# Patient Record
Sex: Male | Born: 1935 | Race: White | Hispanic: No | Marital: Married | Smoking: Former smoker
Health system: Southern US, Community
[De-identification: ages and names within clinical notes are randomized; demographics above are authoritative.]

## PROBLEM LIST (undated history)

## (undated) DIAGNOSIS — E785 Hyperlipidemia, unspecified: Secondary | ICD-10-CM

## (undated) DIAGNOSIS — J329 Chronic sinusitis, unspecified: Secondary | ICD-10-CM

## (undated) DIAGNOSIS — J45909 Unspecified asthma, uncomplicated: Secondary | ICD-10-CM

## (undated) DIAGNOSIS — C801 Malignant (primary) neoplasm, unspecified: Secondary | ICD-10-CM

## (undated) DIAGNOSIS — S42401A Unspecified fracture of lower end of right humerus, initial encounter for closed fracture: Secondary | ICD-10-CM

## (undated) DIAGNOSIS — Z8546 Personal history of malignant neoplasm of prostate: Secondary | ICD-10-CM

## (undated) HISTORY — DX: Chronic sinusitis, unspecified: J32.9

## (undated) HISTORY — PX: APPENDECTOMY: SHX54

## (undated) HISTORY — DX: Unspecified asthma, uncomplicated: J45.909

## (undated) HISTORY — DX: Hyperlipidemia, unspecified: E78.5

## (undated) HISTORY — DX: Personal history of malignant neoplasm of prostate: Z85.46

---

## 1999-07-01 ENCOUNTER — Ambulatory Visit (HOSPITAL_BASED_OUTPATIENT_CLINIC_OR_DEPARTMENT_OTHER): Admission: RE | Admit: 1999-07-01 | Discharge: 1999-07-01 | Payer: Self-pay | Admitting: Orthopedic Surgery

## 1999-09-13 ENCOUNTER — Encounter: Admission: RE | Admit: 1999-09-13 | Discharge: 1999-09-13 | Payer: Self-pay | Admitting: Family Medicine

## 1999-09-13 ENCOUNTER — Encounter: Payer: Self-pay | Admitting: Family Medicine

## 2001-11-30 ENCOUNTER — Encounter: Admission: RE | Admit: 2001-11-30 | Discharge: 2001-11-30 | Payer: Self-pay | Admitting: Family Medicine

## 2001-11-30 ENCOUNTER — Encounter: Payer: Self-pay | Admitting: Family Medicine

## 2003-05-19 ENCOUNTER — Ambulatory Visit: Admission: RE | Admit: 2003-05-19 | Discharge: 2003-08-17 | Payer: Self-pay | Admitting: Radiation Oncology

## 2003-05-22 ENCOUNTER — Encounter: Admission: RE | Admit: 2003-05-22 | Discharge: 2003-05-22 | Payer: Self-pay | Admitting: Urology

## 2003-06-20 ENCOUNTER — Ambulatory Visit (HOSPITAL_COMMUNITY): Admission: RE | Admit: 2003-06-20 | Discharge: 2003-06-20 | Payer: Self-pay | Admitting: Urology

## 2003-06-20 ENCOUNTER — Ambulatory Visit (HOSPITAL_BASED_OUTPATIENT_CLINIC_OR_DEPARTMENT_OTHER): Admission: RE | Admit: 2003-06-20 | Discharge: 2003-06-20 | Payer: Self-pay | Admitting: Urology

## 2004-06-22 ENCOUNTER — Ambulatory Visit: Payer: Self-pay | Admitting: Internal Medicine

## 2004-08-16 ENCOUNTER — Ambulatory Visit: Payer: Self-pay | Admitting: Internal Medicine

## 2004-11-10 ENCOUNTER — Ambulatory Visit: Payer: Self-pay | Admitting: Internal Medicine

## 2005-01-18 ENCOUNTER — Ambulatory Visit: Payer: Self-pay | Admitting: Internal Medicine

## 2005-02-24 ENCOUNTER — Ambulatory Visit: Payer: Self-pay | Admitting: Internal Medicine

## 2005-05-31 ENCOUNTER — Ambulatory Visit: Payer: Self-pay | Admitting: Internal Medicine

## 2005-07-14 ENCOUNTER — Ambulatory Visit: Payer: Self-pay | Admitting: Internal Medicine

## 2005-07-19 ENCOUNTER — Ambulatory Visit: Payer: Self-pay | Admitting: Pulmonary Disease

## 2005-08-05 ENCOUNTER — Ambulatory Visit: Payer: Self-pay | Admitting: Internal Medicine

## 2005-10-10 ENCOUNTER — Ambulatory Visit: Payer: Self-pay | Admitting: Internal Medicine

## 2006-01-16 ENCOUNTER — Ambulatory Visit: Payer: Self-pay | Admitting: Pulmonary Disease

## 2006-03-06 ENCOUNTER — Ambulatory Visit: Payer: Self-pay | Admitting: Internal Medicine

## 2006-03-16 ENCOUNTER — Ambulatory Visit: Payer: Self-pay | Admitting: Internal Medicine

## 2006-06-12 ENCOUNTER — Ambulatory Visit: Payer: Self-pay | Admitting: Internal Medicine

## 2006-07-04 ENCOUNTER — Ambulatory Visit: Payer: Self-pay | Admitting: Internal Medicine

## 2006-09-26 ENCOUNTER — Ambulatory Visit: Payer: Self-pay | Admitting: Internal Medicine

## 2006-10-11 ENCOUNTER — Ambulatory Visit: Payer: Self-pay | Admitting: Internal Medicine

## 2006-10-12 ENCOUNTER — Ambulatory Visit: Payer: Self-pay | Admitting: Internal Medicine

## 2006-11-07 ENCOUNTER — Ambulatory Visit: Payer: Self-pay | Admitting: Internal Medicine

## 2006-12-06 ENCOUNTER — Ambulatory Visit: Payer: Self-pay | Admitting: Internal Medicine

## 2007-01-15 ENCOUNTER — Ambulatory Visit: Payer: Self-pay | Admitting: Internal Medicine

## 2007-05-17 DIAGNOSIS — J4520 Mild intermittent asthma, uncomplicated: Secondary | ICD-10-CM

## 2007-05-17 DIAGNOSIS — J3089 Other allergic rhinitis: Secondary | ICD-10-CM

## 2007-05-17 DIAGNOSIS — J302 Other seasonal allergic rhinitis: Secondary | ICD-10-CM

## 2007-05-17 HISTORY — DX: Other seasonal allergic rhinitis: J30.2

## 2007-05-17 HISTORY — DX: Other allergic rhinitis: J30.89

## 2007-05-21 ENCOUNTER — Encounter: Payer: Self-pay | Admitting: Internal Medicine

## 2007-06-28 ENCOUNTER — Ambulatory Visit: Payer: Self-pay | Admitting: Internal Medicine

## 2007-06-28 DIAGNOSIS — J019 Acute sinusitis, unspecified: Secondary | ICD-10-CM

## 2007-06-28 HISTORY — DX: Acute sinusitis, unspecified: J01.90

## 2007-09-06 ENCOUNTER — Ambulatory Visit: Payer: Self-pay | Admitting: Internal Medicine

## 2007-11-05 ENCOUNTER — Ambulatory Visit: Payer: Self-pay | Admitting: Internal Medicine

## 2008-02-04 ENCOUNTER — Telehealth: Payer: Self-pay | Admitting: Internal Medicine

## 2008-02-06 ENCOUNTER — Ambulatory Visit: Payer: Self-pay | Admitting: Internal Medicine

## 2008-03-27 ENCOUNTER — Ambulatory Visit: Payer: Self-pay | Admitting: Internal Medicine

## 2008-03-31 ENCOUNTER — Telehealth: Payer: Self-pay | Admitting: Internal Medicine

## 2008-04-15 ENCOUNTER — Ambulatory Visit: Payer: Self-pay | Admitting: Internal Medicine

## 2008-06-18 ENCOUNTER — Telehealth (INDEPENDENT_AMBULATORY_CARE_PROVIDER_SITE_OTHER): Payer: Self-pay | Admitting: *Deleted

## 2008-07-24 ENCOUNTER — Ambulatory Visit: Payer: Self-pay | Admitting: Internal Medicine

## 2008-08-21 ENCOUNTER — Ambulatory Visit: Payer: Self-pay | Admitting: Internal Medicine

## 2008-08-21 DIAGNOSIS — C61 Malignant neoplasm of prostate: Secondary | ICD-10-CM | POA: Insufficient documentation

## 2008-08-21 DIAGNOSIS — E785 Hyperlipidemia, unspecified: Secondary | ICD-10-CM

## 2008-08-21 DIAGNOSIS — E782 Mixed hyperlipidemia: Secondary | ICD-10-CM | POA: Insufficient documentation

## 2008-08-21 HISTORY — DX: Mixed hyperlipidemia: E78.2

## 2008-11-13 ENCOUNTER — Ambulatory Visit: Payer: Self-pay | Admitting: Internal Medicine

## 2008-11-21 ENCOUNTER — Ambulatory Visit: Payer: Self-pay | Admitting: Internal Medicine

## 2009-01-16 ENCOUNTER — Telehealth (INDEPENDENT_AMBULATORY_CARE_PROVIDER_SITE_OTHER): Payer: Self-pay | Admitting: *Deleted

## 2009-01-21 ENCOUNTER — Encounter: Payer: Self-pay | Admitting: Internal Medicine

## 2009-02-23 ENCOUNTER — Ambulatory Visit: Payer: Self-pay | Admitting: Internal Medicine

## 2009-05-06 ENCOUNTER — Ambulatory Visit: Payer: Self-pay | Admitting: Internal Medicine

## 2009-06-09 ENCOUNTER — Ambulatory Visit: Payer: Self-pay | Admitting: Internal Medicine

## 2009-08-25 ENCOUNTER — Ambulatory Visit: Payer: Self-pay | Admitting: Internal Medicine

## 2009-08-26 ENCOUNTER — Ambulatory Visit: Payer: Self-pay | Admitting: Internal Medicine

## 2009-12-07 ENCOUNTER — Ambulatory Visit: Payer: Self-pay | Admitting: Internal Medicine

## 2010-01-27 ENCOUNTER — Ambulatory Visit: Payer: Self-pay | Admitting: Internal Medicine

## 2010-06-03 ENCOUNTER — Ambulatory Visit: Payer: Self-pay | Admitting: Internal Medicine

## 2010-06-08 NOTE — Assessment & Plan Note (Signed)
Summary: ALLERGIES/APC   Primary Provider/Referring Provider:  Christell Constant  CC:  ? Allergies; trouble with breathing and ? asthma attack..  History of Present Illness:  2008/09/06- Allergic rhintis, Asthma 3 weeks head congestion, worse lying down. Wearing a mask to mow. Nasal discharge is clear mucus with speck of blood. Ears pressured like fluid. Hearing OK. Denies cough, wheeze, fever. Continues allergy vaccine and claritin.  February 23, 2009-Allergic rhinitis, Asthma rov - nasal congestion -worse at hs - denies SOB or cough Nasal congestion better since cooler weather. Claritin helps, and he can tell when his allergy shot is due.  Steroid nasal sprays helped in past but he would only use up a sample. 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 Denies wheezing. Had flu and pneumovax last month.  June 09, 2009- Allergic rhinitis, Asthma Right ear pops and clears. Nasal congestion snd some drainage with throat clearing. Throat irritated but not sore and no fever. Not wheezing and nothing purulent.  August 25, 2009- Allergic rhinitis, Asthma 1 week with head congestion. Sister-in -law coughing, lives with him. She is apparently not acutely ill with something he might have caught. Mucus can be yellow. Chest gets tight but not wheezing. Right ear stops up.  Denies fever, sore throat, wheeze.    Current Medications (verified): 1)  Claritin 10 Mg  Tabs (Loratadine) .... Take 1 Tablet By Mouth Once A Day 2)  Simvastatin 40 Mg  Tabs (Simvastatin) .... Take 1 Tablet By Mouth Once A Day 3)  Levothroid 150 Mcg  Tabs (Levothyroxine Sodium) .... Take 1 Tablet By Mouth Once A Day 4)  Allergy Vaccine 1:10 Gets At Pcp  Allergies (verified): No Known Drug Allergies  Past History:  Past Medical History: Last updated: Sep 06, 2008 ASTHMA, MILD (ICD-493.90) ALLERGIC RHINITIS (ICD-477.9) hx rhinosinusitis Hx prostate cancer- seed implant  Hyperlipidemia  Past Surgical History: Last updated:  09-06-08 Appendectomy  Family History: Last updated: 2008/09/06 Father- died Alzheimers Mother- died diabetes complications  Social History: Last updated: 02/23/2009 Patient never smoked.  Furniture Musician- retired 2010  Risk Factors: Smoking Status: never (2008-09-06)  Review of Systems      See HPI  The patient denies anorexia, fever, weight loss, weight gain, vision loss, decreased hearing, hoarseness, chest pain, syncope, dyspnea on exertion, peripheral edema, prolonged cough, headaches, hemoptysis, and severe indigestion/heartburn.    Vital Signs:  Patient profile:   77 year old male Height:      72 inches Weight:      143.25 pounds BMI:     19.50 O2 Sat:      99 % on Room air Pulse rate:   71 / minute BP sitting:   132 / 74  (left arm) Cuff size:   regular  Vitals Entered By: Reynaldo Minium CMA (August 25, 2009 9:01 AM)  O2 Flow:  Room air  Physical Exam  Additional Exam:  General: A/Ox3; pleasant and cooperative, NAD, thin SKIN: no rash, lesions NODES: no lymphadenopathy HEENT: [place]/AT, EOM- WNL, Conjuctivae- clear, PERRLA, periorbital edema, TM-WNL., Nose- tight turbinate edema, septal deviation to  left,snorting.   Mallampati II, .Throat- clear and wnl, no post nasal drainage. NECK: Supple w/ fair ROM, JVD- none, normal carotid impulses w/o bruits  CHEST:  slow airflow, no rhonchi. Mild expiratory wheeze left scapula HEART: RRR, no m/g/r heard ABDOMEN: lean QMV:HQIO, nl pulses, no edema  NEURO: Grossly intact to observation      Impression & Recommendations:  Problem # 1:  ALLERGIC RHINITIS (ICD-477.9)  Acute rhinitis- viral or allergic given the time of year. It sounds as iff his family member is chronically ill, not sick with an acute process running through the family.  His updated medication list for this problem includes:    Claritin 10 Mg Tabs (Loratadine) .Marland Kitchen... Take 1 tablet by mouth once a day  Problem # 2:  ASTHMA, MILD  (ICD-493.90) Mild acute exacerbation should respond to depomedrol.  Medications Added to Medication List This Visit: 1)  Zithromax Z-pak 250 Mg Tabs (Azithromycin) .... 2 today then one daily  Other Orders: Est. Patient Level III (04540)  Patient Instructions: 1)  Keep scheduled appointment, or see me once yearly 2)  Depo 80 3)  neb neo nasal 4)  script to hold for antibioitic in case needed Prescriptions: ZITHROMAX Z-PAK 250 MG TABS (AZITHROMYCIN) 2 today then one daily  #1 pak x 0   Entered and Authorized by:   Waymon Budge MD   Signed by:   Waymon Budge MD on 08/25/2009   Method used:   Print then Give to Patient   RxID:   [id number]     Appended Document: ALLERGIES/APC    Clinical Lists Changes  Orders: Added new Service order of Depo- Medrol 80mg  (J1040) - Signed Added new Service order of Admin of Therapeutic Inj  intramuscular or subcutaneous (08657) - Signed Added new Service order of Nebulizer Tx (84696) - Signed       Medication Administration  Injection # 1:    Medication: Depo- Medrol 80mg     Diagnosis: ALLERGIC RHINITIS (ICD-477.9)    Route: SQ    Site: RUOQ gluteus    Exp Date: 03/2012    Lot #: [id number]    Mfr: Pharmacia    Comments: given by Nursing student Carver Fila.    Patient tolerated injection without complications    Given by: Reynaldo Minium CMA (August 25, 2009 4:30 PM)  Medication # 1:    Medication: EMR miscellaneous medications    Diagnosis: ALLERGIC RHINITIS (ICD-477.9)    Dose: 3 drops    Route: intranasal    Exp Date: 09/2010    Lot #: [id number]    Mfr: bayer    Comments: Neo-Synephrine    Patient tolerated medication without complications    Given by: Reynaldo Minium CMA (August 25, 2009 4:31 PM)  Orders Added: 1)  Depo- Medrol 80mg  [J1040] 2)  Admin of Therapeutic Inj  intramuscular or subcutaneous [96372] 3)  Nebulizer Tx [32440]

## 2010-06-08 NOTE — Assessment & Plan Note (Signed)
Summary: 6 months/apc   Primary Provider/Referring Provider:  Christell Constant  CC:  6 month follow up visit-allergies; "thickness and drainage in throat in morning".Right ear-possible loss of hearing-just bothersome.Marland Kitchen  History of Present Illness: 08-31-08- Allergic rhintis, Asthma 3 weeks head congestion, worse lying down. Wearing a mask to mow. Nasal discharge is clear mucus with speck of blood. Ears pressured like fluid. Hearing OK. Denies cough, wheeze, fever. Continues allergy vaccine and claritin.  February 23, 2009-Allergic rhinitis, Asthma rov - nasal congestion -worse at hs - denies SOB or cough Nasal congestion better since cooler weather. Claritin helps, and he can tell when his allergy shot is due.  Steroid nasal sprays helped in past but he would only use up a sample. 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 Denies wheezing. Had flu and pneumovax last month.  June 09, 2009- Allergic rhinitis, Asthma Right ear pops and clears. Nasal congestion snd some drainage with throat clearing. Throat irritated but not sore and no fever. Not wheezing and nothing purulent.  August 25, 2009- Allergic rhinitis, Asthma 1 week with head congestion. Sister-in -law coughing, lives with him. She is apparently not acutely ill with something he might have caught. Mucus can be yellow. Chest gets tight but not wheezing. Right ear stops up.  Denies fever, sore throat, wheeze.  December 07, 2009- Allergic rhinitis, Asthma Right ear stops up. Blowing nose- mostly dry, feels stuffy head. Continues to take allergy shots. Gardens early in the day, but has been outside quite alot in current poor air quality.. Chest is clear- no asthma attacks. Has not needed a rescue inhaler in over a year. He didn't feel he needed antibiotic so didn't take the Z pak last time. Feels "thickness or drainage" in throat but doesn't clear out anything and denies reflux/ heartburn.    Asthma History    Initial Asthma Severity  Rating:    Age range: 12+ years    Symptoms: 0-2 days/week    Nighttime Awakenings: 0-2/month    Interferes w/ normal activity: no limitations    SABA use (not for EIB): 0-2 days/week    Asthma Severity Assessment: Intermittent   Preventive Screening-Counseling & Management  Alcohol-Tobacco     Smoking Status: never  Current Medications (verified): 1)  Claritin 10 Mg  Tabs (Loratadine) .... Take 1 Tablet By Mouth Once A Day 2)  Simvastatin 40 Mg  Tabs (Simvastatin) .... Take 1 Tablet By Mouth Once A Day 3)  Levothroid 150 Mcg  Tabs (Levothyroxine Sodium) .... Take 1 Tablet By Mouth Once A Day 4)  Allergy Vaccine 1:10 Gets At Pcp  Allergies (verified): No Known Drug Allergies  Past History:  Past Medical History: Last updated: 08-31-08 ASTHMA, MILD (ICD-493.90) ALLERGIC RHINITIS (ICD-477.9) hx rhinosinusitis Hx prostate cancer- seed implant  Hyperlipidemia  Past Surgical History: Last updated: 08-31-08 Appendectomy  Family History: Last updated: 31-Aug-2008 Father- died Alzheimers Mother- died diabetes complications  Social History: Last updated: 02/23/2009 Patient never smoked.  Furniture Musician- retired 2010  Risk Factors: Smoking Status: never (12/07/2009)  Review of Systems      See HPI       The patient complains of nasal congestion/difficulty breathing through nose and sneezing.  The patient denies shortness of breath with activity, shortness of breath at rest, productive cough, non-productive cough, chest pain, irregular heartbeats, acid heartburn, indigestion, loss of appetite, weight change, abdominal pain, difficulty swallowing, sore throat, tooth/dental problems, and headaches.    Vital Signs:  Patient profile:  77 year old male Height:      72 inches Weight:      142 pounds BMI:     19.33 O2 Sat:      99 % on Room air Pulse rate:   76 / minute BP sitting:   124 / 70  (right arm) Cuff size:   regular  Vitals Entered By: Reynaldo Minium CMA (December 07, 2009 8:55 AM)  O2 Flow:  Room air CC: 6 month follow up visit-allergies; "thickness and drainage in throat in morning".Right ear-possible loss of hearing-just bothersome.   Physical Exam  Additional Exam:  General: A/Ox3; pleasant and cooperative, NAD, thin. Room air sat 99% SKIN: no rash, lesions NODES: no lymphadenopathy HEENT: [place]/AT, EOM- WNL, Conjuctivae- clear, PERRLA, periorbital edema, TM-right drum is retracted without erythema or fluid.., Nose- tight turbinate edema, septal deviation to  left,snorting.   Mallampati II, .Throat- clear and wnl, no post nasal drainage.  NECK: Supple w/ fair ROM, JVD- none, normal carotid impulses w/o bruits  CHEST:  slow airflow, no rhonchi. Mild expiratory wheeze left scapula HEART: RRR, no m/g/r heard ABDOMEN: lean GHW:EXHB, nl pulses, no edema  NEURO: Grossly intact to observation      Impression & Recommendations:  Problem # 1:  ALLERGIC RHINITIS (ICD-477.9)  Allergy or , more likely right now, vasomotor irritant rhinitis from the air quality problems. There is eustachian dysfunction related to the same process.  His updated medication list for this problem includes:    Claritin 10 Mg Tabs (Loratadine) .Marland Kitchen... Take 1 tablet by mouth once a day    Nasonex 50 Mcg/act Susp (Mometasone furoate) .Marland Kitchen... 2 sprays each nostril once daily  Problem # 2:  ASTHMA, MILD (ICD-493.90) Mild intermittent asthma, not needing additional intervention for now.  Medications Added to Medication List This Visit: 1)  Nasonex 50 Mcg/act Susp (Mometasone furoate) .... 2 sprays each nostril once daily  Other Orders: Est. Patient Level III (71696)  Patient Instructions: 1)  Please schedule a follow-up appointment in 6 months. 2)  Continue allergy vaccine 3)  Sample script nasal steroid inhaler Nasonex 4)    2 sprays each nostril once daily- most people do this at bedtime. 5)  a decongestant like otc Sudafed-PE may  help Prescriptions: NASONEX 50 MCG/ACT SUSP (MOMETASONE FUROATE) 2 sprays each nostril once daily  #1 x prn   Entered and Authorized by:   Waymon Budge MD   Signed by:   Waymon Budge MD on 12/07/2009   Method used:   Print then Give to Patient   RxID:   [id number]

## 2010-06-08 NOTE — Assessment & Plan Note (Signed)
Summary: 6 months/apc   Primary Provider/Referring Provider:  Christell Constant  CC:  6 month follow up visit-?URI; Right ear stopped up.Marland Kitchen  History of Present Illness: 04/15/08- Allergic rhinitis, asthma, recurrent rhinosinusitis Much  yard work yest. Since then increased tightness and cough, without fever, pain or purulent. Some shortness of breath, and tenderness in upper chest.  09/11/08- Allergic rhintis, Asthma 3 weeks head congestion, worse lying down. Wearing a mask to mow. Nasal discharge is clear mucus with speck of blood. Ears pressured like fluid. Hearing OK. Denies cough, wheeze, fever. Continues allergy vaccine and claritin.  February 23, 2009-Allergic rhinitis, Asthma rov - nasal congestion -worse at hs - denies SOB or cough Nasal congestion better since cooler weather. Claritin helps, and he can tell when his allergy shot is due.  Steroid nasal sprays helped in past but he would only use up a sample. 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 Denies wheezing. Had flu and pneumovax last month.  June 09, 2009- Allergic rhinitis, Asthma Right ear pops and clears. Nasal congestion snd some drainage with throat clearing. Throat irritated but not sore and no fever. Not wheezing and nothing purulent.    Current Medications (verified): 1)  Claritin 10 Mg  Tabs (Loratadine) .... Take 1 Tablet By Mouth Once A Day 2)  Simvastatin 40 Mg  Tabs (Simvastatin) .... Take 1 Tablet By Mouth Once A Day 3)  Levothroid 150 Mcg  Tabs (Levothyroxine Sodium) .... Take 1 Tablet By Mouth Once A Day 4)  Allergy Vaccine 1:10 Gets At Pcp  Allergies (verified): No Known Drug Allergies  Past History:  Past Medical History: Last updated: 2008-09-11 ASTHMA, MILD (ICD-493.90) ALLERGIC RHINITIS (ICD-477.9) hx rhinosinusitis Hx prostate cancer- seed implant  Hyperlipidemia  Past Surgical History: Last updated: 09-11-08 Appendectomy  Family History: Last updated: Sep 11, 2008 Father- died  Alzheimers Mother- died diabetes complications  Social History: Last updated: 02/23/2009 Patient never smoked.  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    Vital Signs:  Patient profile:   77 year old male Height:      72 inches Weight:      151.31 pounds BMI:     20.60 O2 Sat:      97 % on Room air Pulse rate:   76 / minute BP sitting:   128 / 80  (right arm) Cuff size:   regular  Vitals Entered By: Reynaldo Minium CMA (June 09, 2009 9:22 AM)  O2 Flow:  Room air  Physical Exam  Additional Exam:  General: A/Ox3; pleasant and cooperative, NAD, thin SKIN: no rash, lesions NODES: no lymphadenopathy HEENT: [place]/AT, EOM- WNL, Conjuctivae- clear, PERRLA, periorbital edema, TM-Right is retracted and question some fluid., Nose- tight turbinate edema, septal deviation to  left, Throat- clear and wnl, no post nasal drainage. NECK: Supple w/ fair ROM, JVD- none, normal carotid impulses w/o bruits  CHEST:  slow airflow, no rhonchi HEART: RRR, no m/g/r heard ABDOMEN: lean ZOX:WRUE, nl pulses, no edema  NEURO: Grossly intact to observation      Impression & Recommendations:  Problem # 1:  ALLERGIC RHINITIS (ICD-477.9)  He has had exposure to loud noise, but some of his intermittent right hearing loss is due to eustachian dysfunction.  we will see if we can help with a neb.We will try sample nasal steroid again. The following medications were removed from the medication list:    Fluticasone Propionate 50 Mcg/act Susp (Fluticasone propionate) .Marland Kitchen... 1-2 sprays each nostril daily when needed His updated medication list for this problem includes:    Claritin 10 Mg Tabs  (Loratadine) .Marland Kitchen... Take 1 tablet by mouth once a day  Problem # 2:  ASTHMA, MILD (ICD-493.90) Good control currently Had flu vax.  Other Orders: Est. Patient Level III (14782) Admin of Therapeutic Inj  intramuscular or subcutaneous (95621) Depo- Medrol 80mg  (J1040) Nebulizer Tx (30865)  Patient Instructions: 1)  Please schedule a follow-up appointment in 6 months. 2)  Neb neo nasal 3)  depo 80 4)  Sample Nasonex nasal steroid spray- 2 puffs each nostril once daily until you use up the sample.     Medication Administration  Injection # 1:    Medication: Depo- Medrol 80mg     Diagnosis: ALLERGIC RHINITIS (ICD-477.9)    Route: SQ    Site: RUOQ gluteus    Exp Date: 02/2010    Lot #: [id number]    Mfr: Teva    Patient tolerated injection without complications    Given by: Reynaldo Minium CMA (June 09, 2009 10:34 AM)  Medication # 1:    Medication: EMR miscellaneous medications    Diagnosis: ALLERGIC RHINITIS (ICD-477.9)    Dose: 3 drops    Route: intranasal    Exp Date: 09/2010    Lot #: [id number]    Mfr: Bayer    Comments: Neo-Synephrine    Patient tolerated medication without complications    Given by: Reynaldo Minium CMA (June 09, 2009 10:35 AM)  Orders Added: 1)  Est. Patient Level III [44010] 2)  Admin of Therapeutic Inj  intramuscular or subcutaneous [96372] 3)  Depo- Medrol 80mg  [J1040] 4)  Nebulizer Tx [27253]

## 2010-06-08 NOTE — Miscellaneous (Signed)
Summary: Injection Orders / [facility]    Injection Orders / [facility]    Imported By: Lennie Odor 10/06/2009 15:10:43  _____________________________________________________________________  External Attachment:    Type:   Image     Comment:   External Document

## 2010-06-09 ENCOUNTER — Ambulatory Visit: Payer: Self-pay | Admitting: Internal Medicine

## 2010-06-09 ENCOUNTER — Ambulatory Visit: Admit: 2010-06-09 | Payer: Self-pay | Admitting: Internal Medicine

## 2010-09-20 ENCOUNTER — Ambulatory Visit (INDEPENDENT_AMBULATORY_CARE_PROVIDER_SITE_OTHER): Payer: Self-pay

## 2010-09-20 DIAGNOSIS — J309 Allergic rhinitis, unspecified: Secondary | ICD-10-CM

## 2010-09-21 NOTE — Assessment & Plan Note (Signed)
[facility]                             PULMONARY OFFICE NOTE   NAME:PIKEKurtis, Anastasia                    MRN:          [id number]  DATE:12/06/2006                            DOB:          01-16-1932    PROBLEMS:  1. Allergic rhinitis.  2. Mild intermittent asthma.   HISTORY:  Allergy vaccine build up with new prescription is doing very  well. We reviewed safety and management aspects. He says that his  breathing is comfortable and he is having no problems. He shows me some  dog scratches on his arm, which look clean.   MEDICATIONS:  1. Claritin.  2. Allergy vaccine.  3. Mucinex.  4. Fosamax.  5. Simvastatin.  6. Levothyroxine.  7. Nasonex nasal spray.   ALLERGIES:  No medication allergy.   OBJECTIVE:  Weight 142 pounds, blood pressure 116/76, pulse 66, room air  saturation 99%. Breath sounds are diminished, but clear and unlabored.  Nasal airway is not obstructed. Heart sounds are normal.   IMPRESSION:  Asthma and allergic rhinitis currently doing well in mid-  summer.   PLAN:  Continue vaccine build up. Schedule return in 6 months, earlier  p.r.n.     Clinton D. Maple Hudson, MD, Tonny Bollman, FACP  Electronically Signed    CDY/MedQ  DD: 12/09/2006  DT: 12/10/2006  Job #: [id number]   cc:   Lianne Bushy, M.D.

## 2010-09-21 NOTE — Assessment & Plan Note (Signed)
[facility]                             PULMONARY OFFICE NOTE   NAME:[name], [name]                    MRN:          [id number]  DATE:09/26/2006                            DOB:          Mar 21, 1932    HISTORY OF PRESENT ILLNESS:  Patient is a 77 year old white male patient  of Dr. Roxy Cedar who has a known history of mild intermittent asthma,  allergic rhinitis, presents today with a 5 day history of nasal  congestion, productive cough with thick whitish yellow mucus. Patient  denies any chest pain, orthopnea, PND, or leg swelling.   PAST MEDICAL HISTORY:  Reviewed.   CURRENT MEDICATIONS:  Reviewed.   PHYSICAL EXAMINATION:  Patient is a pleasant male in no acute distress.  He is afebrile with stable vital signs. O2 saturation is 98% on room  air.  HEENT: Nasal mucosa slightly pale. Nontender sinuses. Posterior pharynx  clear.  NECK: Supple without cervical adenopathy, no JVD.  LUNG SOUNDS: Revealed diminished breath sounds at the bases otherwise  clear.  CARDIAC: Regular rate and rhythm.  ABDOMEN: Soft and nontender.  EXTREMITIES: Warm without any edema.   IMPRESSION AND PLAN:  Acute upper respiratory infection. Patient is to  begin Mucinex DM twice daily. Begin a nasal hygiene regimen with  Nasacort AQ, Afrin, and saline times 5 days. Patient will be given a  prescription for 7 day course of Omnicef to have on hold in case  symptoms worsen with discolored sputum. Patient is to return back with  Dr. Maple Hudson next week as scheduled or sooner if needed.      Rubye Oaks, NP  Electronically Signed      Clinton D. Maple Hudson, MD, Tonny Bollman, FACP  Electronically Signed   TP/MedQ  DD: 09/26/2006  DT: 09/26/2006  Job #: [id number]

## 2010-09-21 NOTE — Assessment & Plan Note (Signed)
[name]                             PULMONARY OFFICE NOTE   NAME:[name], [name]                    MRN:          [id number]  DATE:10/11/2006                            DOB:          1932-04-23    PROBLEM:  1. Allergic rhinitis.  2. Mild intermittent asthma.   HISTORY:  He needed to see the nurse practitioner again in late May with  an exacerbation of asthma and rhinitis, possible upper respiratory  infection. He comes now for skin testing. He has been vaccine at 1:10  based on testing in 2001 and we had discussed previously that he is  beginning to see less control. He used Mucinex D as needed, but still  feels he has a thick clear mucus in his throat from post-nasal drainage.  There has been nothing purulent. Little wheezing once his recent  exacerbation cleared.   MEDICATIONS:  1. Claritin.  2. Allergy vaccine at 1:10.  3. Mucinex.  4. Fosamax.  5. Nasonex.   ALLERGIES:  No medication allergy.   OBJECTIVE:  Weight 142 pounds, blood pressure 122/70.  Pulse is 85.  Room air saturation is 95%.  CHEST: Is clear now.  There is some clear mucus in the back of his throat. Nasal airway is not  obviously obstructed. There is no conjunctival injection or peri-orbital  edema. No adenopathy. Voice quality is normal with no cough or wheeze.  Heart sounds are normal.   SKIN TEST:  Positive histamine and negative diluent controls. Broader  positive reaction profile than he had in 2001, still significant  especially for grass, weed and tree pollens, dust and dust mite and also  some molds. We have reviewed environmental factors and minimizing  exposures to pollen, dust and mold. We have reviewed the risk and goal  considerations of allergy vaccine as a treatment strategy. He was given  Clarinex 5 mg after his skin testing.   PLAN:  We are going to remix and restart his vaccine based on current  testing with new build up to maintenance.  Injections continue to be  given at Dr.  Beatriz Stallion office and they are asked to call if there are  questions. Schedule return with me in two months, earlier p.r.n.     James D. Maple Hudson, MD, [name], FACP  Electronically Signed    CDY/MedQ  DD: 10/11/2006  DT: 10/11/2006  Job #: [id number]   cc:   [name], M.D.

## 2010-09-24 NOTE — Assessment & Plan Note (Signed)
[facility]                               PULMONARY OFFICE NOTE   NAME:[name], [name]                    MRN:          [id number]  DATE:01/16/2006                            DOB:          10/08/1931    HISTORY OF PRESENT ILLNESS:  The patient is a 77 year old white male patient  of Dr. Maple Hudson with a history of allergic rhinitis, who presents with a 3 day  history of nasal congestion, post nasal drip, and nasal stuffiness. The  patient denies any purulent sputum, fever, chest pain, shortness of breath.  The patient does complain of an intermittent dry cough.   PHYSICAL EXAMINATION:  GENERAL:  The patient is a pleasant white male in no  acute distress.  VITAL SIGNS:  He is afebrile with stable vital signs. O2 saturation is 98%  on room air.  HEENT:  Nasal mucosa with some moderate erythema. Non-tender sinus.  Posterior pharynx is clear.  NECK:  Supple without adenopathy. No JVD.  LUNGS:  Clear.  CARDIOVASCULAR:  Regular rate and rhythm.  ABDOMEN:  Soft.  EXTREMITIES:  Upper extremities warm without edema.   IMPRESSION:  Acute rhinitis.   PLAN:  The patient is to begin Nasacort AQ 2 puffs twice daily, Mucinex DM  twice daily, saline Afrin nasal spray x5 days. The patient is to return if  no better or symptoms worsen.                                   Rubye Oaks, NP                                Clinton D. Maple Hudson, MD, [facility], FACP   TP/MedQ  DD:  01/16/2006  DT:  01/17/2006  Job #:  [id number]

## 2010-09-24 NOTE — Assessment & Plan Note (Signed)
[facility]                             PULMONARY OFFICE NOTE   NAME:PIKEEwen, Varnell                    MRN:          [id number]  DATE:07/04/2006                            DOB:          09-29-1931    PROBLEM:  1. Allergic rhinitis.  2. Mild intermittent asthma.   HISTORY:  I had last seen him in March a year ago. He saw the nurse  practitioner here in February with some asthma and his albuterol was  refilled. At that time, he was using Nasacort, but he has been out of  nasal sprays and mentions Nasonex as perhaps his favorite. He blames the  maple trees, which are blooming now, for increased nasal congestion. He  has no headache, purulent discharge or fever. We reviewed his vaccine.  Last skin testing was about seven years ago and does not have adequate  tree pollen in its make up.   MEDICATIONS:  1. Claritin p.r.n.  2. Allergy vaccine at 1:10 which he gets at Dr. Harlene Salts with no      problem.  3. He is out of nasal spray, but has been using an over-the-counter      Mucinex nasal spray.   DRUG INTOLERANCES:  No medication allergy.   OBJECTIVE:  Weight is 148 pounds.  Blood pressure is 110/60.  Pulse 71.  Room air saturation is 98%.  There is significant bilateral nasal mucosal edema with clear watery  secretion. No visible polyps. No conjunctival injection.  CHEST: Is clear.  HEART: Sounds normal.   IMPRESSION:  Exacerbation of allergic rhinitis.   PLAN:  1. Neosynephrine inhalation, Depo-Medrol 80 mg IM with steroid talk.  2. Refilled Nasonex nasal spray twice each nostril daily.  3. He is going to return as able for allergy retesting.     Clinton D. Maple Hudson, MD, [facility], FACP  Electronically Signed    CDY/MedQ  DD: 07/04/2006  DT: 07/04/2006  Job #: [id number]   cc:   Lianne Bushy, M.D.

## 2010-09-24 NOTE — Assessment & Plan Note (Signed)
[facility]                               PULMONARY OFFICE NOTE   NAME:[name], [name]                    MRN:          [id number]  DATE:03/16/2006                            DOB:          1931-10-10    PULMONARY/ALLERGY FOLLOWUP   PROBLEM:  Seasonal allergic rhinitis.   HISTORY:  I had last seen him in March.  He was seen by the nurse  practitioner September 10 for acute rhinitis treated with Nasacort AQ and  Mucinex.  He has continued his vaccine at 1:10 at Dr. Harlene Salts office with no  problems and has had his flu shot.  He is semi-retired from the furniture  business.  He indicates his part of the manufacturing process does not  expose him much to dust or irritants.  In the past week he has had increased  nasal congestion, blamed on weather.  He had done better after his  September visit.  Sometimes, he notes a little shortness of breath and mild  wheeze.  He has not had any rescue inhaler in a long time and has remained  off of Advair because he has not seemed to need it after the sample we gave  in March.   MEDICATIONS:  Claritin.  Allergy vaccine.  Nasacort AQ.   No medication allergy.   OBJECTIVE:  Weight 146 pounds, BP 142/80, pulse regular 84, room air  saturation 98%.  Trace end expiratory wheeze, unlabored.  Regular heart sounds.  There is mild nasal congestion at most.  There is no post-nasal drip.  No neck vein distension.  He is quite talkative actually and seems comfortable.   IMPRESSION:  1. Allergic rhinitis.  2. Asthma with mild exacerbation.   PLAN:  We refilled albuterol.  He will use his Nasacort and he thinks he can  manage his current symptoms with available meds.  Schedule return in 12  months.  Continue vaccine at 1:10.     Clinton D. Maple Hudson, MD, [facility], FACP  Electronically Signed    CDY/MedQ  DD: 03/19/2006  DT: 03/19/2006  Job #: [id number]   cc:   Lianne Bushy, M.D.

## 2010-09-24 NOTE — Op Note (Signed)
NAMESAHMIR, WEATHERBEE                       ACCOUNT NO.:  [id number]   MEDICAL RECORD NO.:  [id number]                   PATIENT TYPE:  AMB   LOCATION:  [facility]                                 FACILITY:  [facility]   PHYSICIAN:  Susanne Borders, MD                     DATE OF BIRTH:  08-23-1931   DATE OF PROCEDURE:  DATE OF DISCHARGE:                                 OPERATIVE REPORT   PREOPERATIVE DIAGNOSIS:  Adenocarcinoma of the prostate.   POSTOPERATIVE DIAGNOSIS:  Adenocarcinoma of the prostate.   OPERATION/PROCEDURE:  1. Transperineal brachytherapy to prostate.  2. Cystoscopy.   SURGEON:  Lucrezia Starch. Earlene Plater, M.D.   ASSISTANT:  1. Wynn Banker, M.D.  2. Susanne Borders, M.D.   ANESTHESIA:  General endotracheal anesthesia.   ESTIMATED BLOOD LOSS:  Minimal.   COMPLICATIONS:  None.   DRAINS:  16-French Foley catheter to straight drainage.   DISPOSITION:  To post anesthesia care unit in stable condition.   INDICATIONS FOR PROCEDURE:  Mr. Cashman is a 77 year old male who was referred  by Dr. Kathrynn Humble to Dr. Earlene Plater for evaluation of elevated PSA and a strong  family history of prostate cancer.  The patient initially had a PSA of 5.8  which was up from 3.7 the year prior.  He subsequently underwent a  transrectal ultrasound guided biopsy of the prostate.  His pathology  returned a Gleason's score of 6 involving less than 5% of the specimen on  the right with high-grade prostatic intraepithelial neoplasia on the left.  The patient's treatment options were discussed with him in detail including  watchful waiting, radiation therapy, and radical prostatectomy.  The patient  chose to have radioactive seed implantation after discussing this with Dr.  Kathrin Greathouse.  He subsequently underwent transrectal ultrasound mapping  of his prostate.  This was used for dosimetry planning by Dr. Dan Humphreys.   DESCRIPTION OF PROCEDURE:  The patient was brought to the operating room and  correctly  identified by his identification bracelet.  He was given  preoperative antibiotics and general endotracheal anesthesia.  He was placed  in exaggerated dorsal lithotomy position and his genitalia and perineum were  prepped and draped in the typical sterile fashion.  A rectal Foley catheter  was placed in the rectum.  The ultrasound probe was carefully inserted to  the rectum and transrectal ultrasound images were obtained.  Preoperative  planning for seed implantation was then performed by Dr. Dan Humphreys.  A few  small adjustments were made in the original mapping schema.  The grid was  then inserted and implantation of the seeds was done systematically,  starting from anterior and moving posteriorly.  Confirmation of the seeds in  the correct locations was done using ultrasound as well as fluoroscopy to  insure the seeds were in the correct location in all three planes.  A total  of 26 needles containing 96  active seeds were inserted.  The radio isotope  used was palladium-103.  The seed activity was 1.360 mCu.  At the end of  implantation, the patient was taken out of lithotomy position.  The rectal  Foley catheter was removed.  The patient's transurethral Foley catheter was  also removed and flexible cystoscopy was performed.  There were no seeds  noted within the prostatic urethra.  The bladder was also free of seeds.  Retroflexion for careful inspection of the bladder neck revealed two  submucosal seeds which were in good position.  The cystoscope was then  removed and the Foley catheter was replaced and connected to a leg bag.   The patient will be discharged home on pain medication and his Foley  catheter.  He is to remove his catheter tomorrow morning.                                               Susanne Borders, MD    DR/MEDQ  D:  06/20/2003  T:  06/20/2003  Job:  [id number]

## 2010-10-20 ENCOUNTER — Ambulatory Visit (INDEPENDENT_AMBULATORY_CARE_PROVIDER_SITE_OTHER): Payer: Medicare Other | Admitting: Internal Medicine

## 2010-10-20 ENCOUNTER — Encounter: Payer: Self-pay | Admitting: Internal Medicine

## 2010-10-20 VITALS — BP 110/80 | HR 74 | Ht 72.0 in | Wt 137.8 lb

## 2010-10-20 DIAGNOSIS — J45909 Unspecified asthma, uncomplicated: Secondary | ICD-10-CM

## 2010-10-20 DIAGNOSIS — J309 Allergic rhinitis, unspecified: Secondary | ICD-10-CM

## 2010-10-20 MED ORDER — PHENYLEPHRINE HCL 1 % NA SOLN
3.0000 [drp] | Freq: Once | NASAL | Status: AC
  Administered 2010-10-20: 3 [drp] via NASAL

## 2010-10-20 MED ORDER — METHYLPREDNISOLONE ACETATE 80 MG/ML IJ SUSP
80.0000 mg | Freq: Once | INTRAMUSCULAR | Status: AC
  Administered 2010-10-20: 80 mg via INTRAMUSCULAR

## 2010-10-20 NOTE — Progress Notes (Signed)
  Subjective:    Patient ID: [name], male    DOB: 10/14/1931, 77 y.o.   MRN: [id number]  HPI 10/20/10- 79 yoM never smoker allergic rhinitis, mild asthma, hx sinusitis, complicated by hx prostate ca and hyperlipidemia Last here December 07, 2009- note reviewed Reports 1 month of increased nasal congestion, post nasal drip. Denies sore throat fever. Ears feel stopped up, hearing unchanged. Not using Nasonex, but favors saline nasal spray.  Feels congestion in upper chest, cough productive white mucus. Denies wheeze and has no inhaler.  Several tick bites in recent weeks. His PCP Dr Marina Goodell did test for tick borne illness and gave doxycycline- just finished- labs negative.  Continues allergy vaccine at 1:10 and claritin  Review of Systems Constitutional:   No weight loss, night sweats,  Fevers, chills, fatigue, lassitude. HEENT:   No headaches,  Difficulty swallowing,  Tooth/dental problems,  Sore throat,                CV:  No chest pain,  Orthopnea, PND, swelling in lower extremities, anasarca, dizziness, palpitations  GI  No heartburn, indigestion, abdominal pain, nausea, vomiting, diarrhea, change in bowel habits, loss of appetite  Resp: No shortness of breath with exertion or at rest.  No excess mucus,   No coughing up of blood.  No change in color of mucus.  No wheezing.    Skin: no rash or lesions.  GU: no dysuria, change in color of urine, no urgency or frequency.  No flank pain.  MS:  No joint pain or swelling.  No decreased range of motion.  No back pain.  Psych:  No change in mood or affect. No depression or anxiety.  No memory loss.      Objective:   Physical Exam General- Alert, Oriented, Affect-appropriate, Distress- none acute  slender  Skin- rash-none, lesions- none, excoriation- none  Lymphadenopathy- none  Head- atraumatic  Eyes- Gross vision intact, PERRLA, conjunctivae clear secretions  Ears- Hearing, canals, Tm -- hearing diminished  Nose- Clear, No-  Septal dev,polyps, erosion, perforation     Mucus bridging and turbinate edema  Throat- Mallampati II , mucosa clear , drainage- none, tonsils- atrophic   No visible drainage  Neck- flexible , trachea midline, no stridor , thyroid nl, carotid no bruit  Chest - symmetrical excursion , unlabored     Heart/CV- RRR , no murmur , no gallop  , no rub, nl s1 s2                     - JVD- none , edema- none, stasis changes- none, varices- none     Lung- clear to P&A, wheeze- none, cough- none , dullness-none, rub- none     Chest wall-  Abd- tender-no, distended-no, bowel sounds-present, HSM- no  Br/ Gen/ Rectal- Not done, not indicated  Extrem- cyanosis- none, clubbing, none, atrophy- none, strength- nl  Neuro- grossly intact to observation         Assessment & Plan:

## 2010-10-20 NOTE — Assessment & Plan Note (Signed)
Minimal- we will see how he responds to the depo today before considering a rescue inhaler.

## 2010-10-20 NOTE — Patient Instructions (Signed)
Neb nasal neo  Depo 80-    Dx allergic rhinitis  Instead of Claritin, consider trying otc generic Allegra =  Fexofenadine as an antihistamine

## 2010-10-20 NOTE — Assessment & Plan Note (Signed)
We will continue allergy vaccine, but suggested he try allegra instead of claritin. Consider restarting nasonex. Can give neb and depo today

## 2010-10-24 ENCOUNTER — Encounter: Payer: Self-pay | Admitting: Internal Medicine

## 2010-11-08 ENCOUNTER — Ambulatory Visit: Payer: Self-pay | Admitting: Internal Medicine

## 2011-01-13 ENCOUNTER — Ambulatory Visit (INDEPENDENT_AMBULATORY_CARE_PROVIDER_SITE_OTHER): Payer: Medicare Other

## 2011-01-13 DIAGNOSIS — J309 Allergic rhinitis, unspecified: Secondary | ICD-10-CM

## 2011-04-18 ENCOUNTER — Telehealth: Payer: Self-pay | Admitting: Internal Medicine

## 2011-04-18 MED ORDER — AZITHROMYCIN 250 MG PO TABS: ORAL_TABLET | ORAL | Status: AC

## 2011-04-18 NOTE — Telephone Encounter (Signed)
Per Cy ok to call in Zpak. Pt aware, [facility] [place]

## 2011-04-18 NOTE — Telephone Encounter (Signed)
I spoke with spouse and she states pt c/o sinus pressure, sinus headache, cough (not sure what color phlem pt is getting up), nasal congestion, right is clogged and it painful x couple days. Pt has been taking mucinex and OTC claritin. Spouse is requesting recs from Dr. Maple Hudson. Please advise, thanks  No Known Allergies

## 2011-05-16 DIAGNOSIS — M75120 Complete rotator cuff tear or rupture of unspecified shoulder, not specified as traumatic: Secondary | ICD-10-CM | POA: Diagnosis not present

## 2011-06-14 ENCOUNTER — Ambulatory Visit (INDEPENDENT_AMBULATORY_CARE_PROVIDER_SITE_OTHER): Payer: Medicare Other

## 2011-06-14 DIAGNOSIS — J309 Allergic rhinitis, unspecified: Secondary | ICD-10-CM | POA: Diagnosis not present

## 2011-06-20 DIAGNOSIS — M999 Biomechanical lesion, unspecified: Secondary | ICD-10-CM | POA: Diagnosis not present

## 2011-06-20 DIAGNOSIS — M5137 Other intervertebral disc degeneration, lumbosacral region: Secondary | ICD-10-CM | POA: Diagnosis not present

## 2011-06-23 DIAGNOSIS — M5137 Other intervertebral disc degeneration, lumbosacral region: Secondary | ICD-10-CM | POA: Diagnosis not present

## 2011-06-23 DIAGNOSIS — M999 Biomechanical lesion, unspecified: Secondary | ICD-10-CM | POA: Diagnosis not present

## 2011-06-29 DIAGNOSIS — M5137 Other intervertebral disc degeneration, lumbosacral region: Secondary | ICD-10-CM | POA: Diagnosis not present

## 2011-06-29 DIAGNOSIS — M999 Biomechanical lesion, unspecified: Secondary | ICD-10-CM | POA: Diagnosis not present

## 2011-07-12 DIAGNOSIS — E039 Hypothyroidism, unspecified: Secondary | ICD-10-CM | POA: Diagnosis not present

## 2011-07-23 DIAGNOSIS — S8010XA Contusion of unspecified lower leg, initial encounter: Secondary | ICD-10-CM | POA: Diagnosis not present

## 2011-07-23 DIAGNOSIS — IMO0002 Reserved for concepts with insufficient information to code with codable children: Secondary | ICD-10-CM | POA: Diagnosis not present

## 2011-08-01 DIAGNOSIS — IMO0002 Reserved for concepts with insufficient information to code with codable children: Secondary | ICD-10-CM | POA: Diagnosis not present

## 2011-08-01 DIAGNOSIS — L02419 Cutaneous abscess of limb, unspecified: Secondary | ICD-10-CM | POA: Diagnosis not present

## 2011-08-01 DIAGNOSIS — L03119 Cellulitis of unspecified part of limb: Secondary | ICD-10-CM | POA: Diagnosis not present

## 2011-08-01 DIAGNOSIS — Z23 Encounter for immunization: Secondary | ICD-10-CM | POA: Diagnosis not present

## 2011-08-17 DIAGNOSIS — M2040 Other hammer toe(s) (acquired), unspecified foot: Secondary | ICD-10-CM | POA: Diagnosis not present

## 2011-08-17 DIAGNOSIS — B351 Tinea unguium: Secondary | ICD-10-CM | POA: Diagnosis not present

## 2011-08-23 DIAGNOSIS — E049 Nontoxic goiter, unspecified: Secondary | ICD-10-CM | POA: Diagnosis not present

## 2011-08-23 DIAGNOSIS — E039 Hypothyroidism, unspecified: Secondary | ICD-10-CM | POA: Diagnosis not present

## 2011-08-30 DIAGNOSIS — E049 Nontoxic goiter, unspecified: Secondary | ICD-10-CM | POA: Diagnosis not present

## 2011-09-06 DIAGNOSIS — R0989 Other specified symptoms and signs involving the circulatory and respiratory systems: Secondary | ICD-10-CM | POA: Diagnosis not present

## 2011-09-06 DIAGNOSIS — J309 Allergic rhinitis, unspecified: Secondary | ICD-10-CM | POA: Diagnosis not present

## 2011-09-06 DIAGNOSIS — R062 Wheezing: Secondary | ICD-10-CM | POA: Diagnosis not present

## 2011-09-14 DIAGNOSIS — E039 Hypothyroidism, unspecified: Secondary | ICD-10-CM | POA: Diagnosis not present

## 2011-09-14 DIAGNOSIS — E049 Nontoxic goiter, unspecified: Secondary | ICD-10-CM | POA: Diagnosis not present

## 2011-09-14 DIAGNOSIS — J309 Allergic rhinitis, unspecified: Secondary | ICD-10-CM | POA: Diagnosis not present

## 2011-09-23 ENCOUNTER — Encounter: Payer: Self-pay | Admitting: Internal Medicine

## 2011-09-23 ENCOUNTER — Ambulatory Visit (INDEPENDENT_AMBULATORY_CARE_PROVIDER_SITE_OTHER): Payer: Medicare Other | Admitting: Internal Medicine

## 2011-09-23 VITALS — BP 138/72 | HR 82 | Ht 72.0 in | Wt 137.6 lb

## 2011-09-23 DIAGNOSIS — J309 Allergic rhinitis, unspecified: Secondary | ICD-10-CM

## 2011-09-23 DIAGNOSIS — J302 Other seasonal allergic rhinitis: Secondary | ICD-10-CM

## 2011-09-23 DIAGNOSIS — J45909 Unspecified asthma, uncomplicated: Secondary | ICD-10-CM | POA: Diagnosis not present

## 2011-09-23 MED ORDER — FLUTICASONE PROPIONATE 50 MCG/ACT NA SUSP: NASAL | Status: DC

## 2011-09-23 MED ORDER — METHYLPREDNISOLONE ACETATE 80 MG/ML IJ SUSP
80.0000 mg | Freq: Once | INTRAMUSCULAR | Status: AC
  Administered 2011-09-23: 80 mg via INTRAMUSCULAR

## 2011-09-23 MED ORDER — LEVALBUTEROL HCL 0.63 MG/3ML IN NEBU
0.6300 mg | INHALATION_SOLUTION | Freq: Once | RESPIRATORY_TRACT | Status: AC
  Administered 2011-09-23: 0.63 mg via RESPIRATORY_TRACT

## 2011-09-23 NOTE — Progress Notes (Signed)
Subjective:    Patient ID: [name], male    DOB: [birth year], 78 y.o.   MRN: [id number]  HPI 10/20/10- 78 yoM never smoker allergic rhinitis, mild asthma, hx sinusitis, complicated by hx prostate ca and hyperlipidemia Last here December 07, 2009- note reviewed Reports 1 month of increased nasal congestion, post nasal drip. Denies sore throat fever. Ears feel stopped up, hearing unchanged. Not using Nasonex, but favors saline nasal spray.  Feels congestion in upper chest, cough productive white mucus. Denies wheeze and has no inhaler.  Several tick bites in recent weeks. His PCP Dr Marina Goodell did test for tick borne illness and gave doxycycline- just finished- labs negative.  Continues allergy vaccine at 1:10 and claritin   09/23/11- 78 yoM never smoker allergic rhinitis, mild asthma, hx sinusitis, complicated by hx prostate ca and hyperlipidemia  c/o sob, sinus congestion at night, and chest tightness Increased head and chest congestion for the past month blamed on pollen. Dr. Marina Goodell, primary physician, it nebulizer and prednisone taper 2 weeks ago with temporary improvement. Out of Nasonex. Has a new dog.  ROS-see HPI Constitutional:   No-   weight loss, night sweats, fevers, chills, fatigue, lassitude. HEENT:   No-  headaches, difficulty swallowing, tooth/dental problems, sore throat,       + sneezing, itching, ear ache, nasal congestion, post nasal drip,  CV:  No-   chest pain, orthopnea, PND, swelling in lower extremities, anasarca, dizziness, palpitations Resp: +  shortness of breath with exertion or at rest.              No-   productive cough,  No non-productive cough,  No- coughing up of blood.              No-   change in color of mucus.  No- wheezing.   Skin: No-   rash or lesions. GI:  No-   heartburn, indigestion, abdominal pain, nausea, vomiting, GU: . MS:  No-   joint pain or swelling.  . Neuro-     nothing unusual Psych:  No- change in mood or affect. No depression or  anxiety.  No memory loss.  OBJ- Physical Exam General- Alert, Oriented, Affect-appropriate, Distress- none acute Skin- rash-none, lesions- none, excoriation- none Lymphadenopathy- none Head- atraumatic            Eyes- Gross vision intact, PERRLA, conjunctivae and secretions clear            Ears- Hearing, canals-normal            Nose- + turbinate edema, no-Septal dev, mucus, polyps, erosion, perforation             Throat- Mallampati II , mucosa clear , drainage- none, tonsils- atrophic Neck- flexible , trachea midline, no stridor , thyroid nl, carotid no bruit Chest - symmetrical excursion , unlabored           Heart/CV- RRR , no murmur , no gallop  , no rub, nl s1 s2                           - JVD- none , edema- none, stasis changes- none, varices- none           Lung- distant, wheeze- none, cough- none , dullness-none, rub- none           Chest wall-  Abd-  Br/ Gen/ Rectal- Not done, not indicated Extrem- cyanosis- none, clubbing, none, atrophy- none, strength-  nl Neuro- grossly intact to observation

## 2011-09-23 NOTE — Patient Instructions (Signed)
Script for fluticasone/ Flonase nasal spray, to replace Nasonex  Neb Xop 0.63  Depo 80

## 2011-09-28 NOTE — Assessment & Plan Note (Signed)
Seasonal exacerbation. We discussed effectiveness of therapies. Plan-refill Flonase. Depo-Medrol. Will be paying attention as we reach the usual end of the pollen season in May

## 2011-09-28 NOTE — Assessment & Plan Note (Signed)
Exacerbation attributed to seasonal allergy. Plan- nebulizer treatment, Depo-Medrol

## 2011-10-18 ENCOUNTER — Ambulatory Visit (INDEPENDENT_AMBULATORY_CARE_PROVIDER_SITE_OTHER): Payer: Medicare Other

## 2011-10-18 DIAGNOSIS — J309 Allergic rhinitis, unspecified: Secondary | ICD-10-CM

## 2011-10-20 ENCOUNTER — Ambulatory Visit: Payer: Medicare Other | Admitting: Internal Medicine

## 2011-12-06 DIAGNOSIS — H6120 Impacted cerumen, unspecified ear: Secondary | ICD-10-CM | POA: Diagnosis not present

## 2011-12-06 DIAGNOSIS — H902 Conductive hearing loss, unspecified: Secondary | ICD-10-CM | POA: Diagnosis not present

## 2011-12-20 DIAGNOSIS — Z125 Encounter for screening for malignant neoplasm of prostate: Secondary | ICD-10-CM | POA: Diagnosis not present

## 2011-12-20 DIAGNOSIS — C61 Malignant neoplasm of prostate: Secondary | ICD-10-CM | POA: Diagnosis not present

## 2011-12-20 DIAGNOSIS — E782 Mixed hyperlipidemia: Secondary | ICD-10-CM | POA: Diagnosis not present

## 2011-12-20 DIAGNOSIS — E039 Hypothyroidism, unspecified: Secondary | ICD-10-CM | POA: Diagnosis not present

## 2011-12-20 DIAGNOSIS — E785 Hyperlipidemia, unspecified: Secondary | ICD-10-CM | POA: Diagnosis not present

## 2012-01-17 ENCOUNTER — Telehealth: Payer: Self-pay | Admitting: *Deleted

## 2012-01-17 NOTE — Telephone Encounter (Signed)
I called in a rx for syringes for [name] one day last wk.Marland Kitchen He calls today to tell me [facility] in [place] didn't get it. I called and gave the rx verbally this time.(last time I left a voicemail.) To whom it may concern.

## 2012-01-18 ENCOUNTER — Ambulatory Visit (INDEPENDENT_AMBULATORY_CARE_PROVIDER_SITE_OTHER): Payer: Medicare Other

## 2012-01-18 DIAGNOSIS — J309 Allergic rhinitis, unspecified: Secondary | ICD-10-CM | POA: Diagnosis not present

## 2012-01-24 DIAGNOSIS — H2589 Other age-related cataract: Secondary | ICD-10-CM | POA: Diagnosis not present

## 2012-01-31 DIAGNOSIS — H2510 Age-related nuclear cataract, unspecified eye: Secondary | ICD-10-CM | POA: Diagnosis not present

## 2012-02-09 DIAGNOSIS — H2510 Age-related nuclear cataract, unspecified eye: Secondary | ICD-10-CM | POA: Diagnosis not present

## 2012-02-09 DIAGNOSIS — H2589 Other age-related cataract: Secondary | ICD-10-CM | POA: Diagnosis not present

## 2012-02-24 DIAGNOSIS — H2510 Age-related nuclear cataract, unspecified eye: Secondary | ICD-10-CM | POA: Diagnosis not present

## 2012-02-24 DIAGNOSIS — H11159 Pinguecula, unspecified eye: Secondary | ICD-10-CM | POA: Diagnosis not present

## 2012-03-01 DIAGNOSIS — H2510 Age-related nuclear cataract, unspecified eye: Secondary | ICD-10-CM | POA: Diagnosis not present

## 2012-03-01 DIAGNOSIS — H2589 Other age-related cataract: Secondary | ICD-10-CM | POA: Diagnosis not present

## 2012-03-16 DIAGNOSIS — C44319 Basal cell carcinoma of skin of other parts of face: Secondary | ICD-10-CM | POA: Diagnosis not present

## 2012-03-16 DIAGNOSIS — Z85828 Personal history of other malignant neoplasm of skin: Secondary | ICD-10-CM | POA: Diagnosis not present

## 2012-03-16 DIAGNOSIS — D235 Other benign neoplasm of skin of trunk: Secondary | ICD-10-CM | POA: Diagnosis not present

## 2012-03-16 DIAGNOSIS — C4441 Basal cell carcinoma of skin of scalp and neck: Secondary | ICD-10-CM | POA: Diagnosis not present

## 2012-03-16 DIAGNOSIS — D2360 Other benign neoplasm of skin of unspecified upper limb, including shoulder: Secondary | ICD-10-CM | POA: Diagnosis not present

## 2012-03-17 DIAGNOSIS — J029 Acute pharyngitis, unspecified: Secondary | ICD-10-CM | POA: Diagnosis not present

## 2012-03-17 DIAGNOSIS — R5383 Other fatigue: Secondary | ICD-10-CM | POA: Diagnosis not present

## 2012-03-17 DIAGNOSIS — J45909 Unspecified asthma, uncomplicated: Secondary | ICD-10-CM | POA: Diagnosis not present

## 2012-03-17 DIAGNOSIS — J9801 Acute bronchospasm: Secondary | ICD-10-CM | POA: Diagnosis not present

## 2012-03-17 DIAGNOSIS — E539 Vitamin B deficiency, unspecified: Secondary | ICD-10-CM | POA: Diagnosis not present

## 2012-03-23 DIAGNOSIS — J45909 Unspecified asthma, uncomplicated: Secondary | ICD-10-CM | POA: Diagnosis not present

## 2012-03-23 DIAGNOSIS — J029 Acute pharyngitis, unspecified: Secondary | ICD-10-CM | POA: Diagnosis not present

## 2012-03-23 DIAGNOSIS — R0602 Shortness of breath: Secondary | ICD-10-CM | POA: Diagnosis not present

## 2012-03-23 DIAGNOSIS — J9801 Acute bronchospasm: Secondary | ICD-10-CM | POA: Diagnosis not present

## 2012-03-29 ENCOUNTER — Encounter: Payer: Self-pay | Admitting: Internal Medicine

## 2012-03-29 ENCOUNTER — Ambulatory Visit (INDEPENDENT_AMBULATORY_CARE_PROVIDER_SITE_OTHER): Payer: Medicare Other | Admitting: Internal Medicine

## 2012-03-29 VITALS — BP 122/60 | HR 82 | Ht 72.0 in | Wt 136.2 lb

## 2012-03-29 DIAGNOSIS — J45909 Unspecified asthma, uncomplicated: Secondary | ICD-10-CM | POA: Diagnosis not present

## 2012-03-29 DIAGNOSIS — J302 Other seasonal allergic rhinitis: Secondary | ICD-10-CM

## 2012-03-29 DIAGNOSIS — J309 Allergic rhinitis, unspecified: Secondary | ICD-10-CM

## 2012-03-29 DIAGNOSIS — R0609 Other forms of dyspnea: Secondary | ICD-10-CM | POA: Diagnosis not present

## 2012-03-29 DIAGNOSIS — J3089 Other allergic rhinitis: Secondary | ICD-10-CM

## 2012-03-29 DIAGNOSIS — R0600 Dyspnea, unspecified: Secondary | ICD-10-CM

## 2012-03-29 DIAGNOSIS — R0989 Other specified symptoms and signs involving the circulatory and respiratory systems: Secondary | ICD-10-CM

## 2012-03-29 MED ORDER — DOXYCYCLINE HYCLATE 100 MG PO TABS: ORAL_TABLET | ORAL | Status: DC

## 2012-03-29 MED ORDER — FLUTICASONE FUROATE-VILANTEROL 100-25 MCG/INH IN AEPB: 1.0000 | INHALATION_SPRAY | Freq: Every day | RESPIRATORY_TRACT | Status: DC

## 2012-03-29 NOTE — Patient Instructions (Addendum)
Order CXR- dyspnea, asthma  Sample Breo Ellipta   1 puff, then rinse mouth, once daily  Script to hold for antibiotic doxycycline to take if needed

## 2012-03-29 NOTE — Progress Notes (Signed)
Subjective:    Patient ID: [name], male    DOB: 1932/03/09, 78 y.o.   MRN: [id number]  HPI 10/20/10- 79 yoM never smoker allergic rhinitis, mild asthma, hx sinusitis, complicated by hx prostate ca and hyperlipidemia Last here December 07, 2009- note reviewed Reports 1 month of increased nasal congestion, post nasal drip. Denies sore throat fever. Ears feel stopped up, hearing unchanged. Not using Nasonex, but favors saline nasal spray.  Feels congestion in upper chest, cough productive white mucus. Denies wheeze and has no inhaler.  Several tick bites in recent weeks. His PCP Dr Marina Goodell did test for tick borne illness and gave doxycycline- just finished- labs negative.  Continues allergy vaccine at 1:10 and claritin   09/23/11- 79 yoM never smoker allergic rhinitis, mild asthma, hx sinusitis, complicated by hx prostate ca and hyperlipidemia  c/o sob, sinus congestion at night, and chest tightness Increased head and chest congestion for the past month blamed on pollen. Dr. Marina Goodell, primary physician, it nebulizer and prednisone taper 2 weeks ago with temporary improvement. Out of Nasonex. Has a new dog.  03/29/12- 09/23/11- 79 yoM never smoker allergic rhinitis, mild asthma, hx sinusitis, complicated by hx prostate ca and hyperlipidemia Follows for: Increased SOB for 10 days - Has 2 days left of pred taper from primary md - -Finished zpak - Wheezing - Chest tightness Finishing prednisone taper and Z-Pak. Can't clear his chest. Cough is productive of yellow sputum with some increase in baseline dyspnea with exertion. No pain or feve  ROS-see HPI Constitutional:   No-   weight loss, night sweats, fevers, chills, fatigue, lassitude. HEENT:   No-  headaches, difficulty swallowing, tooth/dental problems, sore throat,       + sneezing, itching, ear ache, nasal congestion, post nasal drip,  CV:  No-   chest pain, orthopnea, PND, swelling in lower extremities, anasarca, dizziness, palpitations Resp:  +  shortness of breath with exertion or at rest.              + productive cough,  No non-productive cough,  No- coughing up of blood.              +   change in color of mucus.  No- wheezing.   Skin: No-   rash or lesions. GI:  No-   heartburn, indigestion, abdominal pain, nausea, vomiting, GU: . MS:  No-   joint pain or swelling.  . Neuro-     nothing unusual Psych:  No- change in mood or affect. No depression or anxiety.  No memory loss.  OBJ- Physical Exam General- Alert, Oriented, Affect-appropriate, Distress- none acute. Slender Skin- rash-none, lesions- none, excoriation- none Lymphadenopathy- none Head- atraumatic            Eyes- Gross vision intact, PERRLA, conjunctivae and secretions clear            Ears- + seems hard of hearing            Nose- + turbinate edema, no-Septal dev, mucus, polyps, erosion, perforation             Throat- Mallampati II , mucosa clear , drainage- none, tonsils- atrophic Neck- flexible , trachea midline, no stridor , thyroid nl, carotid no bruit Chest - symmetrical excursion , unlabored           Heart/CV- RRR , no murmur , no gallop  , no rub, nl s1 s2                           -  JVD- none , edema- none, stasis changes- none, varices- none           Lung- distant, wheeze- none, cough- none , dullness-none, rub- none           Chest wall-  Abd-  Br/ Gen/ Rectal- Not done, not indicated Extrem- cyanosis- none, clubbing, none, atrophy- none, strength- nl Neuro- grossly intact to observation

## 2012-04-03 ENCOUNTER — Telehealth: Payer: Self-pay | Admitting: Internal Medicine

## 2012-04-03 NOTE — Telephone Encounter (Signed)
Pt last seen by Dr. Maple Hudson on 03/29/12:  Patient Instructions     Order CXR- dyspnea, asthma  Sample Breo Ellipta 1 puff, then rinse mouth, once daily  Script to hold for antibiotic doxycycline to take if needed    ------  lmomtcb -- doxy rx was printed.  Did pt get this filled?

## 2012-04-04 NOTE — Telephone Encounter (Signed)
Pt states he did get the printed rx and nothing further is needed.Carron Curie, CMA

## 2012-04-10 DIAGNOSIS — C4441 Basal cell carcinoma of skin of scalp and neck: Secondary | ICD-10-CM | POA: Diagnosis not present

## 2012-04-10 DIAGNOSIS — Z85828 Personal history of other malignant neoplasm of skin: Secondary | ICD-10-CM | POA: Diagnosis not present

## 2012-04-10 NOTE — Assessment & Plan Note (Signed)
Exacerbation of asthma with bronchitis Plan chest x-ray, sample of Breo Ellipta

## 2012-04-10 NOTE — Assessment & Plan Note (Signed)
Nasal symptoms fairly well controlled despite acute lower respiratory exacerbation

## 2012-04-20 ENCOUNTER — Ambulatory Visit: Payer: Medicare Other | Admitting: Internal Medicine

## 2012-04-21 DIAGNOSIS — J9801 Acute bronchospasm: Secondary | ICD-10-CM | POA: Diagnosis not present

## 2012-04-21 DIAGNOSIS — E539 Vitamin B deficiency, unspecified: Secondary | ICD-10-CM | POA: Diagnosis not present

## 2012-04-21 DIAGNOSIS — J45909 Unspecified asthma, uncomplicated: Secondary | ICD-10-CM | POA: Diagnosis not present

## 2012-04-25 DIAGNOSIS — C44319 Basal cell carcinoma of skin of other parts of face: Secondary | ICD-10-CM | POA: Diagnosis not present

## 2012-05-24 DIAGNOSIS — R05 Cough: Secondary | ICD-10-CM | POA: Diagnosis not present

## 2012-05-24 DIAGNOSIS — Z136 Encounter for screening for cardiovascular disorders: Secondary | ICD-10-CM | POA: Diagnosis not present

## 2012-05-24 DIAGNOSIS — Z0000 Encounter for general adult medical examination without abnormal findings: Secondary | ICD-10-CM | POA: Diagnosis not present

## 2012-05-24 DIAGNOSIS — Z1389 Encounter for screening for other disorder: Secondary | ICD-10-CM | POA: Diagnosis not present

## 2012-05-24 DIAGNOSIS — J029 Acute pharyngitis, unspecified: Secondary | ICD-10-CM | POA: Diagnosis not present

## 2012-05-24 DIAGNOSIS — Z131 Encounter for screening for diabetes mellitus: Secondary | ICD-10-CM | POA: Diagnosis not present

## 2012-05-24 DIAGNOSIS — E559 Vitamin D deficiency, unspecified: Secondary | ICD-10-CM | POA: Diagnosis not present

## 2012-05-24 DIAGNOSIS — E039 Hypothyroidism, unspecified: Secondary | ICD-10-CM | POA: Diagnosis not present

## 2012-05-24 DIAGNOSIS — Z01419 Encounter for gynecological examination (general) (routine) without abnormal findings: Secondary | ICD-10-CM | POA: Diagnosis not present

## 2012-05-24 DIAGNOSIS — J40 Bronchitis, not specified as acute or chronic: Secondary | ICD-10-CM | POA: Diagnosis not present

## 2012-05-24 DIAGNOSIS — N139 Obstructive and reflux uropathy, unspecified: Secondary | ICD-10-CM | POA: Diagnosis not present

## 2012-05-24 DIAGNOSIS — Z130 Encounter for screening for diseases of the blood and blood-forming organs and certain disorders involving the immune mechanism: Secondary | ICD-10-CM | POA: Diagnosis not present

## 2012-05-24 DIAGNOSIS — J9801 Acute bronchospasm: Secondary | ICD-10-CM | POA: Diagnosis not present

## 2012-06-12 DIAGNOSIS — H1130 Conjunctival hemorrhage, unspecified eye: Secondary | ICD-10-CM | POA: Diagnosis not present

## 2012-06-13 ENCOUNTER — Telehealth: Payer: Self-pay | Admitting: Internal Medicine

## 2012-06-13 NOTE — Telephone Encounter (Signed)
Called, spoke with pt.  Pt thinks he is getting bronchitis.  C/o increased SOB when talking or walking, head/chest congestion, fatigue, prod cough with clear mucus, and chest tightness x 1 wk.  Denies wheezing, body aches, f/c/s.  States he is using albuterol hfa with short term relief and taking allegra.  Requesting OV tomorrow -- pt would rather be seen vs rx being called in.  Dr. Maple Hudson, pls advise if pt can be worked in tomorrow or if ok for pt to see TP.  (Pt is ok with seeing TP).  Pls advise.  Thank you.

## 2012-06-13 NOTE — Telephone Encounter (Signed)
LMTCBx1 to schedule pt with TP tomorrow. Carron Curie, CMA

## 2012-06-13 NOTE — Telephone Encounter (Signed)
Per CY-okay to add on to TP's schedule on Thursday.

## 2012-06-13 NOTE — Telephone Encounter (Signed)
Pt returned triage's call.  I set an appt w/ TP for tomorrow, 06/14/12, at 10:00 am.  Pt is very appreciative.  Pt stated nothing further needed at this time.  Antionette Fairy

## 2012-06-14 ENCOUNTER — Ambulatory Visit (INDEPENDENT_AMBULATORY_CARE_PROVIDER_SITE_OTHER): Payer: Medicare Other | Admitting: Adult Health

## 2012-06-14 ENCOUNTER — Ambulatory Visit (INDEPENDENT_AMBULATORY_CARE_PROVIDER_SITE_OTHER)
Admission: RE | Admit: 2012-06-14 | Discharge: 2012-06-14 | Disposition: A | Discharge status: Home / Self Care | Payer: Medicare Other | Source: Ambulatory Visit | Attending: Adult Health | Admitting: Adult Health

## 2012-06-14 ENCOUNTER — Encounter: Payer: Self-pay | Admitting: Adult Health

## 2012-06-14 VITALS — BP 128/74 | HR 73 | Temp 97.3°F | Ht 72.0 in | Wt 134.0 lb

## 2012-06-14 DIAGNOSIS — J189 Pneumonia, unspecified organism: Secondary | ICD-10-CM | POA: Diagnosis not present

## 2012-06-14 DIAGNOSIS — J45909 Unspecified asthma, uncomplicated: Secondary | ICD-10-CM

## 2012-06-14 DIAGNOSIS — J159 Unspecified bacterial pneumonia: Secondary | ICD-10-CM | POA: Insufficient documentation

## 2012-06-14 HISTORY — DX: Unspecified bacterial pneumonia: J15.9

## 2012-06-14 MED ORDER — AZITHROMYCIN 250 MG PO TABS: ORAL_TABLET | ORAL | Status: AC

## 2012-06-14 MED ORDER — LEVALBUTEROL HCL 0.63 MG/3ML IN NEBU
0.6300 mg | INHALATION_SOLUTION | Freq: Once | RESPIRATORY_TRACT | Status: AC
  Administered 2012-06-14: 0.63 mg via RESPIRATORY_TRACT

## 2012-06-14 MED ORDER — MOXIFLOXACIN HCL 400 MG PO TABS: 400.0000 mg | ORAL_TABLET | Freq: Every day | ORAL | Status: DC

## 2012-06-14 MED ORDER — CEFTRIAXONE SODIUM 1 G IJ SOLR
1.0000 g | Freq: Once | INTRAMUSCULAR | Status: AC
  Administered 2012-06-14: 1 g via INTRAMUSCULAR

## 2012-06-14 NOTE — Addendum Note (Signed)
Addended by: Boone Master E on: 06/14/2012 12:41 PM   Modules accepted: Orders

## 2012-06-14 NOTE — Progress Notes (Signed)
Subjective:    Patient ID: [name], male    DOB: 16-Jan-1932, 78 y.o.   MRN: [id number]  HPI 10/20/10- 79 yoM never smoker allergic rhinitis, mild asthma, hx sinusitis, complicated by hx prostate ca and hyperlipidemia Last here December 07, 2009- note reviewed Reports 1 month of increased nasal congestion, post nasal drip. Denies sore throat fever. Ears feel stopped up, hearing unchanged. Not using Nasonex, but favors saline nasal spray.  Feels congestion in upper chest, cough productive white mucus. Denies wheeze and has no inhaler.  Several tick bites in recent weeks. His PCP Dr Marina Goodell did test for tick borne illness and gave doxycycline- just finished- labs negative.  Continues allergy vaccine at 1:10 and claritin   09/23/11- 79 yoM never smoker allergic rhinitis, mild asthma, hx sinusitis, complicated by hx prostate ca and hyperlipidemia  c/o sob, sinus congestion at night, and chest tightness Increased head and chest congestion for the past month blamed on pollen. Dr. Marina Goodell, primary physician, it nebulizer and prednisone taper 2 weeks ago with temporary improvement. Out of Nasonex. Has a new dog.  03/29/12- 09/23/11- 79 yoM never smoker allergic rhinitis, mild asthma, hx sinusitis, complicated by hx prostate ca and hyperlipidemia Follows for: Increased SOB for 10 days - Has 2 days left of pred taper from primary md - -Finished zpak - Wheezing - Chest tightness Finishing prednisone taper and Z-Pak. Can't clear his chest. Cough is productive of yellow sputum with some increase in baseline dyspnea with exertion. No pain or fever.   06/14/2012 Acute OV  Complains of  asthma flare w/ increased SOB, tightness in chest, some wheezing, dry cough, decreased energy, dizziness x3-4weeks - SOB worse x1 week.  has been treated by PCP w/ cheratussin, biaxin 500mg , mucinex dm. PCP gave sample of BReo and Albuterol inhaler.  Was unable to clarithromycin due to stomach pains and nausea.  CXR today shows  Pneumonia in the right middle lobe. No significant wt loss. Denies any hemoptysis, orthopnea, PND, or leg swelling.      ROS-see HPI Constitutional:   No  weight loss, night sweats,  Fevers, chills, + fatigue, or  lassitude.  HEENT:   No headaches,  Difficulty swallowing,  Tooth/dental problems, or  Sore throat,                No sneezing, itching, ear ache,  +nasal congestion, post nasal drip,   CV:  No chest pain,  Orthopnea, PND, swelling in lower extremities, anasarca, dizziness, palpitations, syncope.   GI  No heartburn, indigestion, abdominal pain, nausea, vomiting, diarrhea, change in bowel habits, loss of appetite, bloody stools.   Resp:   No coughing up of blood.     No chest wall deformity  Skin: no rash or lesions.  GU: no dysuria, change in color of urine, no urgency or frequency.  No flank pain, no hematuria   MS:  No joint pain or swelling.  No decreased range of motion.  No back pain.  Psych:  No change in mood or affect. No depression or anxiety.  No memory loss.        OBJ- Physical Exam GEN: A/Ox3; pleasant , NAD, well nourished   HEENT:  [place]/AT,  EACs-clear, TMs-wnl, NOSE-clear, THROAT-clear, no lesions, no postnasal drip or exudate noted.   NECK:  Supple w/ fair ROM; no JVD; normal carotid impulses w/o bruits; no thyromegaly or nodules palpated; no lymphadenopathy.  RESP  Clear  P & A; w/o, wheezes/ rales/ or rhonchi.no accessory  muscle use, no dullness to percussion  CARD:  RRR, no m/r/g  , no peripheral edema, pulses intact, no cyanosis or clubbing.  GI:   Soft & nt; nml bowel sounds; no organomegaly or masses detected.  Musco: Warm bil, no deformities or joint swelling noted.   Neuro: alert, no focal deficits noted.    Skin: Warm, no lesions or rashes

## 2012-06-14 NOTE — Assessment & Plan Note (Signed)
RML PNA - pt w/out significant Asthma flare . VSS. NML O2 sats  Advised if worsens will need sooner follow up or ER attention.  Xopenex neb in office .  Rocephin 1 gram injection. X 1   Plan  ZPack take as directed.  Rocephin shot today .  Mucinex DM Twice daily  As needed  Cough/congestion  Follow up Dr. Maple Hudson  In 1-2 weeks with chest xray.  Please contact office for sooner follow up if symptoms do not improve or worsen or seek emergency care

## 2012-06-14 NOTE — Patient Instructions (Addendum)
ZPack take as directed.  Rocephin shot today .  Mucinex DM Twice daily  As needed  Cough/congestion  Follow up Dr. Maple Hudson  In 1-2 weeks with chest xray.  Please contact office for sooner follow up if symptoms do not improve or worsen or seek emergency care

## 2012-06-18 ENCOUNTER — Ambulatory Visit (INDEPENDENT_AMBULATORY_CARE_PROVIDER_SITE_OTHER): Payer: Medicare Other

## 2012-06-18 DIAGNOSIS — J309 Allergic rhinitis, unspecified: Secondary | ICD-10-CM

## 2012-06-19 ENCOUNTER — Ambulatory Visit: Payer: Medicare Other

## 2012-06-25 ENCOUNTER — Ambulatory Visit (INDEPENDENT_AMBULATORY_CARE_PROVIDER_SITE_OTHER): Payer: Medicare Other | Admitting: Internal Medicine

## 2012-06-25 ENCOUNTER — Encounter: Payer: Self-pay | Admitting: Internal Medicine

## 2012-06-25 ENCOUNTER — Ambulatory Visit (INDEPENDENT_AMBULATORY_CARE_PROVIDER_SITE_OTHER)
Admission: RE | Admit: 2012-06-25 | Discharge: 2012-06-25 | Disposition: A | Discharge status: Home / Self Care | Payer: Medicare Other | Source: Ambulatory Visit | Attending: Internal Medicine | Admitting: Internal Medicine

## 2012-06-25 VITALS — BP 122/78 | HR 86 | Ht 72.0 in | Wt 135.0 lb

## 2012-06-25 DIAGNOSIS — J159 Unspecified bacterial pneumonia: Secondary | ICD-10-CM | POA: Diagnosis not present

## 2012-06-25 DIAGNOSIS — J45909 Unspecified asthma, uncomplicated: Secondary | ICD-10-CM | POA: Diagnosis not present

## 2012-06-25 DIAGNOSIS — J189 Pneumonia, unspecified organism: Secondary | ICD-10-CM | POA: Diagnosis not present

## 2012-06-25 NOTE — Assessment & Plan Note (Signed)
RML pneumonia clinically resolving.  Plan f/u CXR. Discussed prolonged course for complete resolution.

## 2012-06-25 NOTE — Assessment & Plan Note (Signed)
Using rescue inhaler 1-2x/ week at most.

## 2012-06-25 NOTE — Progress Notes (Signed)
Subjective:    Patient ID: [name], male    DOB: [birth year], 79 y.o.   MRN: [id number]  HPI 10/20/10- 79 yoM never smoker allergic rhinitis, mild asthma, hx sinusitis, complicated by hx prostate ca and hyperlipidemia Last here December 07, 2009- note reviewed Reports 1 month of increased nasal congestion, post nasal drip. Denies sore throat fever. Ears feel stopped up, hearing unchanged. Not using Nasonex, but favors saline nasal spray.  Feels congestion in upper chest, cough productive white mucus. Denies wheeze and has no inhaler.  Several tick bites in recent weeks. His PCP Dr Marina Goodell did test for tick borne illness and gave doxycycline- just finished- labs negative.  Continues allergy vaccine at 1:10 and claritin   09/23/11- 79 yoM never smoker allergic rhinitis, mild asthma, hx sinusitis, complicated by hx prostate ca and hyperlipidemia  c/o sob, sinus congestion at night, and chest tightness Increased head and chest congestion for the past month blamed on pollen. Dr. Marina Goodell, primary physician, it nebulizer and prednisone taper 2 weeks ago with temporary improvement. Out of Nasonex. Has a new dog.  03/29/12- 09/23/11- 79 yoM never smoker allergic rhinitis, mild asthma, hx sinusitis, complicated by hx prostate ca and hyperlipidemia Follows for: Increased SOB for 10 days - Has 2 days left of pred taper from primary md - -Finished zpak - Wheezing - Chest tightness Finishing prednisone taper and Z-Pak. Can't clear his chest. Cough is productive of yellow sputum with some increase in baseline dyspnea with exertion. No pain or fever.   06/14/2012 Acute OV  Complains of  asthma flare w/ increased SOB, tightness in chest, some wheezing, dry cough, decreased energy, dizziness x3-4weeks - SOB worse x1 week.  has been treated by PCP w/ cheratussin, biaxin 500mg , mucinex dm. PCP gave sample of BReo and Albuterol inhaler.  Was unable to clarithromycin due to stomach pains and nausea.  CXR today shows  Pneumonia in the right middle lobe. No significant wt loss. Denies any hemoptysis, orthopnea, PND, or leg swelling.  06/25/12- 80 yoM never smoker allergic rhinitis, mild asthma, hx sinusitis, complicated by hx prostate ca and hyperlipidemia FOLLOWS FOR: saw TP on 06-14-12 and feeling better; ? CXR needed today. S/p RML bacterial pneumonia 06/14/12- failed to respond to biaxin from PCP because patient stopped early with GI upset. Here changed to Rocephin inj, avelox and Zpak. Now much improved with no F, sweat. Only mild dry cough. Uses rescue inhaler occ if needed, not daily.  CXR 06/15/12 IMPRESSION:  Pneumonia in the right middle lobe.  Original Report Authenticated By: Francene Boyers, M.D.  ROS-see HPI Constitutional:   No-   weight loss, night sweats, fevers, chills, fatigue, lassitude. HEENT:   No-  headaches, difficulty swallowing, tooth/dental problems, sore throat,       No-  sneezing, itching, ear ache, nasal congestion, post nasal drip,  CV:  No-   chest pain, orthopnea, PND, swelling in lower extremities, anasarca,                                  dizziness, palpitations Resp: No-   shortness of breath with exertion or at rest.              No-   productive cough,  + non-productive cough,  No- coughing up of blood.              No-   change in color of mucus.  No- wheezing.   Skin: No-   rash or lesions. GI:  No-   heartburn, indigestion, abdominal pain, nausea, vomiting,  GU:  MS:  No-   joint pain or swelling.   Neuro-     nothing unusual Psych:  No- change in mood or affect. No depression or anxiety.  No memory loss.   OBJ- Physical Exam General- Alert, Oriented, Affect-appropriate, Distress- none acute. Thin. Skin- rash-none, lesions- none, excoriation- none Lymphadenopathy- none Head- atraumatic            Eyes- Gross vision intact, PERRLA, conjunctivae and secretions clear            Ears- Hearing, canals-normal            Nose- Clear, no-Septal dev, mucus, polyps,  erosion, perforation             Throat- Mallampati II , mucosa clear , drainage- none, tonsils- atrophic Neck- flexible , trachea midline, no stridor , thyroid nl, carotid no bruit Chest - symmetrical excursion , unlabored           Heart/CV- RRR , no murmur , no gallop  , no rub, nl s1 s2                           - JVD- none , edema- none, stasis changes- none, varices- none           Lung- clear to P&A, wheeze- none, cough- none , dullness-none, rub- none           Chest wall-  Abd-  Br/ Gen/ Rectal- Not done, not indicated Extrem- cyanosis- none, clubbing, none, atrophy- none, strength- nl Neuro- grossly intact to observation

## 2012-06-25 NOTE — Patient Instructions (Addendum)
Order- CXR   Dx resolving RML pneumonia  Continue routine meds  Please call us as needed

## 2012-06-27 ENCOUNTER — Telehealth: Payer: Self-pay | Admitting: Internal Medicine

## 2012-06-27 NOTE — Telephone Encounter (Signed)
Notes Recorded by Waymon Budge, MD on 06/25/2012 at 5:32 PM CXR- just a residual streak left from the pneumonia. We should repeat the CXR once last time at next visit --  I spoke with patient about results and he verbalized understanding and had no questions.

## 2012-07-02 DIAGNOSIS — C61 Malignant neoplasm of prostate: Secondary | ICD-10-CM | POA: Diagnosis not present

## 2012-07-02 DIAGNOSIS — E039 Hypothyroidism, unspecified: Secondary | ICD-10-CM | POA: Diagnosis not present

## 2012-07-20 ENCOUNTER — Telehealth: Payer: Self-pay | Admitting: Internal Medicine

## 2012-07-20 MED ORDER — PREDNISONE 10 MG PO TABS: ORAL_TABLET | ORAL | Status: DC

## 2012-07-20 MED ORDER — CEFDINIR 300 MG PO CAPS: 300.0000 mg | ORAL_CAPSULE | Freq: Two times a day (BID) | ORAL | Status: DC

## 2012-07-20 NOTE — Telephone Encounter (Signed)
I spoke with pt and is aware of CDY recs. rx has been sent to the pharmacy. Nothing further was needed

## 2012-07-20 NOTE — Telephone Encounter (Signed)
I spoke with pt. C/o dry cough, increase SOB, chest cong, head cong, chest tx, chills, sweats x 3 days. No fever. He has been takingmucinex and using his "breathers". Pt is requesting to have something called in. Please advise Dr. Maple Hudson thanks Last OV 06/25/12 Pending OV 12/24/12 Allergies  Allergen Reactions  . Clarithromycin     REACTION: GI upset

## 2012-07-20 NOTE — Telephone Encounter (Signed)
Per CY---  1.  Cefdinir 300 mg  #14  1 po bid 2.  pred taper  8 day-- 10 mg  #20  4 tabs x 2 days 3 tabs x 2 days  2 tabs x 2 days 1 tab x 2 days then stop.

## 2012-08-01 DIAGNOSIS — M5137 Other intervertebral disc degeneration, lumbosacral region: Secondary | ICD-10-CM | POA: Diagnosis not present

## 2012-08-01 DIAGNOSIS — M999 Biomechanical lesion, unspecified: Secondary | ICD-10-CM | POA: Diagnosis not present

## 2012-08-08 DIAGNOSIS — M5137 Other intervertebral disc degeneration, lumbosacral region: Secondary | ICD-10-CM | POA: Diagnosis not present

## 2012-08-08 DIAGNOSIS — M999 Biomechanical lesion, unspecified: Secondary | ICD-10-CM | POA: Diagnosis not present

## 2012-08-22 DIAGNOSIS — M5137 Other intervertebral disc degeneration, lumbosacral region: Secondary | ICD-10-CM | POA: Diagnosis not present

## 2012-08-22 DIAGNOSIS — M999 Biomechanical lesion, unspecified: Secondary | ICD-10-CM | POA: Diagnosis not present

## 2012-09-24 ENCOUNTER — Ambulatory Visit: Payer: Medicare Other | Admitting: Internal Medicine

## 2012-10-22 DIAGNOSIS — M19019 Primary osteoarthritis, unspecified shoulder: Secondary | ICD-10-CM | POA: Diagnosis not present

## 2012-10-22 DIAGNOSIS — Z139 Encounter for screening, unspecified: Secondary | ICD-10-CM | POA: Diagnosis not present

## 2012-11-27 ENCOUNTER — Ambulatory Visit (INDEPENDENT_AMBULATORY_CARE_PROVIDER_SITE_OTHER): Payer: Medicare Other

## 2012-11-27 DIAGNOSIS — J309 Allergic rhinitis, unspecified: Secondary | ICD-10-CM

## 2012-12-18 DIAGNOSIS — C61 Malignant neoplasm of prostate: Secondary | ICD-10-CM | POA: Diagnosis not present

## 2012-12-18 DIAGNOSIS — M810 Age-related osteoporosis without current pathological fracture: Secondary | ICD-10-CM | POA: Diagnosis not present

## 2012-12-18 DIAGNOSIS — E785 Hyperlipidemia, unspecified: Secondary | ICD-10-CM | POA: Diagnosis not present

## 2012-12-18 DIAGNOSIS — E039 Hypothyroidism, unspecified: Secondary | ICD-10-CM | POA: Diagnosis not present

## 2012-12-18 DIAGNOSIS — N400 Enlarged prostate without lower urinary tract symptoms: Secondary | ICD-10-CM | POA: Diagnosis not present

## 2012-12-18 DIAGNOSIS — M19019 Primary osteoarthritis, unspecified shoulder: Secondary | ICD-10-CM | POA: Diagnosis not present

## 2012-12-24 ENCOUNTER — Ambulatory Visit: Payer: Medicare Other | Admitting: Internal Medicine

## 2012-12-31 ENCOUNTER — Encounter: Payer: Self-pay | Admitting: Internal Medicine

## 2012-12-31 ENCOUNTER — Ambulatory Visit (INDEPENDENT_AMBULATORY_CARE_PROVIDER_SITE_OTHER): Payer: Medicare Other | Admitting: Internal Medicine

## 2012-12-31 VITALS — BP 130/70 | HR 76 | Ht 70.0 in | Wt 131.4 lb

## 2012-12-31 DIAGNOSIS — J302 Other seasonal allergic rhinitis: Secondary | ICD-10-CM

## 2012-12-31 DIAGNOSIS — J309 Allergic rhinitis, unspecified: Secondary | ICD-10-CM

## 2012-12-31 DIAGNOSIS — J45909 Unspecified asthma, uncomplicated: Secondary | ICD-10-CM

## 2012-12-31 MED ORDER — METHYLPREDNISOLONE ACETATE 80 MG/ML IJ SUSP
80.0000 mg | Freq: Once | INTRAMUSCULAR | Status: AC
  Administered 2012-12-31: 80 mg via INTRAMUSCULAR

## 2012-12-31 MED ORDER — LEVALBUTEROL HCL 0.63 MG/3ML IN NEBU: 1.0000 | INHALATION_SOLUTION | Freq: Once | RESPIRATORY_TRACT | Status: DC

## 2012-12-31 MED ORDER — LEVALBUTEROL HCL 0.63 MG/3ML IN NEBU
0.6300 mg | INHALATION_SOLUTION | Freq: Once | RESPIRATORY_TRACT | Status: AC
Start: 2012-12-31 — End: 2012-12-31
  Administered 2012-12-31: 0.63 mg via RESPIRATORY_TRACT

## 2012-12-31 MED ORDER — ALBUTEROL SULFATE HFA 108 (90 BASE) MCG/ACT IN AERS: 2.0000 | INHALATION_SPRAY | Freq: Four times a day (QID) | RESPIRATORY_TRACT | Status: DC | PRN

## 2012-12-31 NOTE — Patient Instructions (Addendum)
Script for albuterol HFA rescue inhaler to use 2 puffs every 4-6 hours if needed   Neb xop 0.63  Depo 80  We can continue allergy vaccine  1:10 GO  Please call as needed

## 2012-12-31 NOTE — Progress Notes (Signed)
Subjective:    Patient ID: [name], male    DOB: 1931-10-27, 79 y.o.   MRN: [id number]  HPI 10/20/10- 79 yoM never smoker allergic rhinitis, mild asthma, hx sinusitis, complicated by hx prostate ca and hyperlipidemia Last here December 07, 2009- note reviewed Reports 1 month of increased nasal congestion, post nasal drip. Denies sore throat fever. Ears feel stopped up, hearing unchanged. Not using Nasonex, but favors saline nasal spray.  Feels congestion in upper chest, cough productive white mucus. Denies wheeze and has no inhaler.  Several tick bites in recent weeks. His PCP Dr Marina Goodell did test for tick borne illness and gave doxycycline- just finished- labs negative.  Continues allergy vaccine at 1:10 and claritin   09/23/11- 79 yoM never smoker allergic rhinitis, mild asthma, hx sinusitis, complicated by hx prostate ca and hyperlipidemia  c/o sob, sinus congestion at night, and chest tightness Increased head and chest congestion for the past month blamed on pollen. Dr. Marina Goodell, primary physician, it nebulizer and prednisone taper 2 weeks ago with temporary improvement. Out of Nasonex. Has a new dog.  03/29/12- 09/23/11- 79 yoM never smoker allergic rhinitis, mild asthma, hx sinusitis, complicated by hx prostate ca and hyperlipidemia Follows for: Increased SOB for 10 days - Has 2 days left of pred taper from primary md - -Finished zpak - Wheezing - Chest tightness Finishing prednisone taper and Z-Pak. Can't clear his chest. Cough is productive of yellow sputum with some increase in baseline dyspnea with exertion. No pain or fever.   06/14/2012 Acute OV  Complains of  asthma flare w/ increased SOB, tightness in chest, some wheezing, dry cough, decreased energy, dizziness x3-4weeks - SOB worse x1 week.  has been treated by PCP w/ cheratussin, biaxin 500mg , mucinex dm. PCP gave sample of BReo and Albuterol inhaler.  Was unable to clarithromycin due to stomach pains and nausea.  CXR today shows  Pneumonia in the right middle lobe. No significant wt loss. Denies any hemoptysis, orthopnea, PND, or leg swelling.  06/25/12- 80 yoM never smoker allergic rhinitis, mild asthma, hx sinusitis, complicated by hx prostate ca and hyperlipidemia FOLLOWS FOR: saw TP on 06-14-12 and feeling better; ? CXR needed today. S/p RML bacterial pneumonia 06/14/12- failed to respond to biaxin from PCP because patient stopped early with GI upset. Here changed to Rocephin inj, avelox and Zpak. Now much improved with no F, sweat. Only mild dry cough. Uses rescue inhaler occ if needed, not daily.  CXR 06/15/12 IMPRESSION:  Pneumonia in the right middle lobe.  Original Report Authenticated By: Francene Boyers, M.D.  12/31/12- 81 yoM never smoker allergic rhinitis, mild asthma, hx sinusitis, complicated by hx prostate ca and hyperlipidemia FOLLOWS FOR: still on allergy vaccine and no reactions. Recent flare up-chest congestion. Not able to cough anything up-feels tight. Considers this "seasonal" exacerbation. Continues allergy vaccine 1:10 GO CXR 06/25/12 IMPRESSION:  Hyperinflation again noted. There is residual streaky infiltrate  in the right middle lobe consistent with improving pneumonia.  Follow-up to complete resolution is recommended. No new infiltrate  is noted.  Original Report Authenticated By: Natasha Mead, M.D.   ROS-see HPI Constitutional:   No-   weight loss, night sweats, fevers, chills, fatigue, lassitude. HEENT:   No-  headaches, difficulty swallowing, tooth/dental problems, sore throat,       No-  sneezing, itching, ear ache, nasal congestion, post nasal drip,  CV:  No-   chest pain, orthopnea, PND, swelling in lower extremities, anasarca, dizziness, palpitations Resp: + shortness  of breath with exertion or at rest.              No-   productive cough,  + non-productive cough,  No- coughing up of blood.              No-   change in color of mucus.  No- wheezing.   Skin: No-   rash or lesions. GI:   No-   heartburn, indigestion, abdominal pain, nausea, vomiting,  GU:  MS:  No-   joint pain or swelling.   Neuro-     nothing unusual Psych:  No- change in mood or affect. No depression or anxiety.  No memory loss.   OBJ- Physical Exam General- Alert, Oriented, Affect-appropriate, Distress- none acute. Thin. Skin- rash-none, lesions- none, excoriation- none Lymphadenopathy- none Head- atraumatic            Eyes- Gross vision intact, PERRLA, conjunctivae and secretions clear            Ears- Hearing, canals-normal            Nose- Clear, no-Septal dev, mucus, polyps, erosion, perforation             Throat- Mallampati II , mucosa clear , drainage- none, tonsils- atrophic Neck- flexible , trachea midline, no stridor , thyroid nl, carotid no bruit Chest - symmetrical excursion , unlabored           Heart/CV- RRR , no murmur , no gallop  , no rub, nl s1 s2                           - JVD- none , edema- none, stasis changes- none, varices- none           Lung- +distant, wheeze- none, cough+dry , dullness-none, rub- none           Chest wall-  Abd-  Br/ Gen/ Rectal- Not done, not indicated Extrem- cyanosis- none, clubbing, none, atrophy- none, strength- nl Neuro- grossly intact to observation

## 2013-01-01 DIAGNOSIS — E785 Hyperlipidemia, unspecified: Secondary | ICD-10-CM | POA: Diagnosis not present

## 2013-01-01 DIAGNOSIS — E039 Hypothyroidism, unspecified: Secondary | ICD-10-CM | POA: Diagnosis not present

## 2013-01-01 DIAGNOSIS — R634 Abnormal weight loss: Secondary | ICD-10-CM | POA: Diagnosis not present

## 2013-01-01 DIAGNOSIS — C61 Malignant neoplasm of prostate: Secondary | ICD-10-CM | POA: Diagnosis not present

## 2013-01-12 NOTE — Assessment & Plan Note (Signed)
Mild asthma with exacerbation Plan-nebulizer Xopenex, Depo-Medrol. Refill rescue inhaler

## 2013-01-12 NOTE — Assessment & Plan Note (Signed)
He says usually allergy vaccine has served him well. This is a time of year when he expects cough.

## 2013-01-16 DIAGNOSIS — M810 Age-related osteoporosis without current pathological fracture: Secondary | ICD-10-CM | POA: Diagnosis not present

## 2013-01-29 DIAGNOSIS — M5137 Other intervertebral disc degeneration, lumbosacral region: Secondary | ICD-10-CM | POA: Diagnosis not present

## 2013-01-29 DIAGNOSIS — M999 Biomechanical lesion, unspecified: Secondary | ICD-10-CM | POA: Diagnosis not present

## 2013-01-31 DIAGNOSIS — Z23 Encounter for immunization: Secondary | ICD-10-CM | POA: Diagnosis not present

## 2013-01-31 DIAGNOSIS — E039 Hypothyroidism, unspecified: Secondary | ICD-10-CM | POA: Diagnosis not present

## 2013-01-31 DIAGNOSIS — M810 Age-related osteoporosis without current pathological fracture: Secondary | ICD-10-CM | POA: Diagnosis not present

## 2013-02-12 DIAGNOSIS — Z86010 Personal history of colonic polyps: Secondary | ICD-10-CM | POA: Diagnosis not present

## 2013-02-12 DIAGNOSIS — Z1211 Encounter for screening for malignant neoplasm of colon: Secondary | ICD-10-CM | POA: Diagnosis not present

## 2013-02-14 DIAGNOSIS — E039 Hypothyroidism, unspecified: Secondary | ICD-10-CM | POA: Diagnosis not present

## 2013-03-14 DIAGNOSIS — Z79899 Other long term (current) drug therapy: Secondary | ICD-10-CM | POA: Diagnosis not present

## 2013-03-14 DIAGNOSIS — E785 Hyperlipidemia, unspecified: Secondary | ICD-10-CM | POA: Diagnosis not present

## 2013-03-14 DIAGNOSIS — K648 Other hemorrhoids: Secondary | ICD-10-CM | POA: Diagnosis not present

## 2013-03-14 DIAGNOSIS — C61 Malignant neoplasm of prostate: Secondary | ICD-10-CM | POA: Diagnosis not present

## 2013-03-14 DIAGNOSIS — K5730 Diverticulosis of large intestine without perforation or abscess without bleeding: Secondary | ICD-10-CM | POA: Diagnosis not present

## 2013-03-14 DIAGNOSIS — M818 Other osteoporosis without current pathological fracture: Secondary | ICD-10-CM | POA: Diagnosis not present

## 2013-03-14 DIAGNOSIS — E0590 Thyrotoxicosis, unspecified without thyrotoxic crisis or storm: Secondary | ICD-10-CM | POA: Diagnosis not present

## 2013-03-14 DIAGNOSIS — Z1211 Encounter for screening for malignant neoplasm of colon: Secondary | ICD-10-CM | POA: Diagnosis not present

## 2013-03-14 DIAGNOSIS — Z86010 Personal history of colonic polyps: Secondary | ICD-10-CM | POA: Diagnosis not present

## 2013-03-20 DIAGNOSIS — Z85828 Personal history of other malignant neoplasm of skin: Secondary | ICD-10-CM | POA: Diagnosis not present

## 2013-03-20 DIAGNOSIS — C4441 Basal cell carcinoma of skin of scalp and neck: Secondary | ICD-10-CM | POA: Diagnosis not present

## 2013-03-20 DIAGNOSIS — L570 Actinic keratosis: Secondary | ICD-10-CM | POA: Diagnosis not present

## 2013-04-11 DIAGNOSIS — J209 Acute bronchitis, unspecified: Secondary | ICD-10-CM | POA: Diagnosis not present

## 2013-04-11 DIAGNOSIS — J309 Allergic rhinitis, unspecified: Secondary | ICD-10-CM | POA: Diagnosis not present

## 2013-04-12 ENCOUNTER — Ambulatory Visit (INDEPENDENT_AMBULATORY_CARE_PROVIDER_SITE_OTHER): Payer: Medicare Other

## 2013-04-12 DIAGNOSIS — J309 Allergic rhinitis, unspecified: Secondary | ICD-10-CM

## 2013-04-17 DIAGNOSIS — J029 Acute pharyngitis, unspecified: Secondary | ICD-10-CM | POA: Diagnosis not present

## 2013-04-25 DIAGNOSIS — C4491 Basal cell carcinoma of skin, unspecified: Secondary | ICD-10-CM | POA: Diagnosis not present

## 2013-04-25 DIAGNOSIS — Z85828 Personal history of other malignant neoplasm of skin: Secondary | ICD-10-CM | POA: Diagnosis not present

## 2013-05-06 DIAGNOSIS — R634 Abnormal weight loss: Secondary | ICD-10-CM | POA: Diagnosis not present

## 2013-05-06 DIAGNOSIS — C61 Malignant neoplasm of prostate: Secondary | ICD-10-CM | POA: Diagnosis not present

## 2013-05-06 DIAGNOSIS — M810 Age-related osteoporosis without current pathological fracture: Secondary | ICD-10-CM | POA: Diagnosis not present

## 2013-05-06 DIAGNOSIS — D649 Anemia, unspecified: Secondary | ICD-10-CM | POA: Diagnosis not present

## 2013-05-06 DIAGNOSIS — E039 Hypothyroidism, unspecified: Secondary | ICD-10-CM | POA: Diagnosis not present

## 2013-05-07 ENCOUNTER — Telehealth: Payer: Self-pay | Admitting: Internal Medicine

## 2013-05-07 MED ORDER — PREDNISONE 10 MG PO TABS: ORAL_TABLET | ORAL | Status: DC

## 2013-05-07 MED ORDER — AZITHROMYCIN 250 MG PO TABS: ORAL_TABLET | ORAL | Status: DC

## 2013-05-07 NOTE — Telephone Encounter (Signed)
Last OV 12-31-12. Pt c/o having productive cough with yellow phlegm, chest tightness, increased SOB, sinus congestion all x 4 days. Pt states is taking mucinex with some relief. Please advise. Carron Curie, CMA Allergies  Allergen Reactions  . Clarithromycin     REACTION: GI upset   Current Outpatient Prescriptions on File Prior to Visit  Medication Sig Dispense Refill  . albuterol (PROVENTIL HFA;VENTOLIN HFA) 108 (90 BASE) MCG/ACT inhaler Inhale 2 puffs into the lungs every 6 (six) hours as needed for wheezing or shortness of breath.  1 Inhaler  prn  . fluticasone (FLONASE) 50 MCG/ACT nasal spray 2 sprays each nostril every night at bedtime while needed  16 g  prn  . Fluticasone Furoate-Vilanterol (BREO ELLIPTA) 100-25 MCG/INH AEPB Inhale 1 puff into the lungs daily.  1 each  0  . levothyroxine (SYNTHROID, LEVOTHROID) 112 MCG tablet Take 112 mcg by mouth daily before breakfast.      . loratadine (CLARITIN) 10 MG tablet Take 10 mg by mouth daily.        Marland Kitchen UNABLE TO FIND Med Name: Allergy injection once a week       No current facility-administered medications on file prior to visit.

## 2013-05-07 NOTE — Telephone Encounter (Signed)
Offer Z pak          Prednisone 10 mg, # 20,, 4 X 2 DAYS, 3 X 2 DAYS, 2 X 2 DAYS, 1 X 2 DAYS

## 2013-05-07 NOTE — Telephone Encounter (Signed)
Pt aware of recs and rx sent. Nothing further needed 

## 2013-05-21 DIAGNOSIS — M999 Biomechanical lesion, unspecified: Secondary | ICD-10-CM | POA: Diagnosis not present

## 2013-05-21 DIAGNOSIS — M5137 Other intervertebral disc degeneration, lumbosacral region: Secondary | ICD-10-CM | POA: Diagnosis not present

## 2013-05-28 DIAGNOSIS — Z85828 Personal history of other malignant neoplasm of skin: Secondary | ICD-10-CM | POA: Diagnosis not present

## 2013-05-28 DIAGNOSIS — L821 Other seborrheic keratosis: Secondary | ICD-10-CM | POA: Diagnosis not present

## 2013-05-28 DIAGNOSIS — D692 Other nonthrombocytopenic purpura: Secondary | ICD-10-CM | POA: Diagnosis not present

## 2013-05-28 DIAGNOSIS — D239 Other benign neoplasm of skin, unspecified: Secondary | ICD-10-CM | POA: Diagnosis not present

## 2013-05-28 DIAGNOSIS — L738 Other specified follicular disorders: Secondary | ICD-10-CM | POA: Diagnosis not present

## 2013-05-31 DIAGNOSIS — M545 Low back pain, unspecified: Secondary | ICD-10-CM | POA: Diagnosis not present

## 2013-05-31 DIAGNOSIS — J309 Allergic rhinitis, unspecified: Secondary | ICD-10-CM | POA: Diagnosis not present

## 2013-05-31 DIAGNOSIS — J019 Acute sinusitis, unspecified: Secondary | ICD-10-CM | POA: Diagnosis not present

## 2013-06-03 DIAGNOSIS — Y929 Unspecified place or not applicable: Secondary | ICD-10-CM | POA: Diagnosis not present

## 2013-06-03 DIAGNOSIS — M129 Arthropathy, unspecified: Secondary | ICD-10-CM | POA: Diagnosis not present

## 2013-06-03 DIAGNOSIS — R51 Headache: Secondary | ICD-10-CM | POA: Diagnosis not present

## 2013-06-03 DIAGNOSIS — S0120XA Unspecified open wound of nose, initial encounter: Secondary | ICD-10-CM | POA: Diagnosis not present

## 2013-06-03 DIAGNOSIS — W010XXA Fall on same level from slipping, tripping and stumbling without subsequent striking against object, initial encounter: Secondary | ICD-10-CM | POA: Diagnosis not present

## 2013-06-03 DIAGNOSIS — J45909 Unspecified asthma, uncomplicated: Secondary | ICD-10-CM | POA: Diagnosis not present

## 2013-06-03 DIAGNOSIS — S0003XA Contusion of scalp, initial encounter: Secondary | ICD-10-CM | POA: Diagnosis not present

## 2013-06-03 DIAGNOSIS — S0993XA Unspecified injury of face, initial encounter: Secondary | ICD-10-CM | POA: Diagnosis not present

## 2013-06-03 DIAGNOSIS — S0990XA Unspecified injury of head, initial encounter: Secondary | ICD-10-CM | POA: Diagnosis not present

## 2013-06-03 DIAGNOSIS — S199XXA Unspecified injury of neck, initial encounter: Secondary | ICD-10-CM | POA: Diagnosis not present

## 2013-06-03 DIAGNOSIS — E039 Hypothyroidism, unspecified: Secondary | ICD-10-CM | POA: Diagnosis not present

## 2013-06-03 DIAGNOSIS — M1610 Unilateral primary osteoarthritis, unspecified hip: Secondary | ICD-10-CM | POA: Diagnosis not present

## 2013-06-03 DIAGNOSIS — S01309A Unspecified open wound of unspecified ear, initial encounter: Secondary | ICD-10-CM | POA: Diagnosis not present

## 2013-06-03 DIAGNOSIS — G319 Degenerative disease of nervous system, unspecified: Secondary | ICD-10-CM | POA: Diagnosis not present

## 2013-06-03 DIAGNOSIS — S0180XA Unspecified open wound of other part of head, initial encounter: Secondary | ICD-10-CM | POA: Diagnosis not present

## 2013-07-03 ENCOUNTER — Encounter: Payer: Self-pay | Admitting: Internal Medicine

## 2013-07-03 ENCOUNTER — Ambulatory Visit (INDEPENDENT_AMBULATORY_CARE_PROVIDER_SITE_OTHER): Payer: Medicare Other | Admitting: Internal Medicine

## 2013-07-03 VITALS — BP 122/78 | HR 78

## 2013-07-03 DIAGNOSIS — J45909 Unspecified asthma, uncomplicated: Secondary | ICD-10-CM | POA: Diagnosis not present

## 2013-07-03 DIAGNOSIS — J309 Allergic rhinitis, unspecified: Secondary | ICD-10-CM

## 2013-07-03 DIAGNOSIS — J3089 Other allergic rhinitis: Principal | ICD-10-CM

## 2013-07-03 DIAGNOSIS — J302 Other seasonal allergic rhinitis: Secondary | ICD-10-CM

## 2013-07-03 DIAGNOSIS — J019 Acute sinusitis, unspecified: Secondary | ICD-10-CM

## 2013-07-03 MED ORDER — DOXYCYCLINE HYCLATE 100 MG PO TABS: ORAL_TABLET | ORAL | Status: DC

## 2013-07-03 NOTE — Patient Instructions (Signed)
Neb neo nasal  Depo 80  Script sent for doxycycline   Ok to try an ear wax removal kit for your Right ear  We can continue allergy vaccine at 1:10 GO

## 2013-07-03 NOTE — Progress Notes (Signed)
Subjective:    Patient ID: [name], male    DOB: 16-Jan-1932, 80 y.o.   MRN: [id number]  HPI 10/20/10- 79 yoM never smoker allergic rhinitis, mild asthma, hx sinusitis, complicated by hx prostate ca and hyperlipidemia Last here December 07, 2009- note reviewed Reports 1 month of increased nasal congestion, post nasal drip. Denies sore throat fever. Ears feel stopped up, hearing unchanged. Not using Nasonex, but favors saline nasal spray.  Feels congestion in upper chest, cough productive white mucus. Denies wheeze and has no inhaler.  Several tick bites in recent weeks. His PCP Dr Henrene Pastor did test for tick borne illness and gave doxycycline- just finished- labs negative.  Continues allergy vaccine at 1:10 and claritin   09/23/11- 79 yoM never smoker allergic rhinitis, mild asthma, hx sinusitis, complicated by hx prostate ca and hyperlipidemia  c/o sob, sinus congestion at night, and chest tightness Increased head and chest congestion for the past month blamed on pollen. Dr. Henrene Pastor, primary physician, it nebulizer and prednisone taper 2 weeks ago with temporary improvement. Out of Nasonex. Has a new dog.  03/29/12- 09/23/11- 79 yoM never smoker allergic rhinitis, mild asthma, hx sinusitis, complicated by hx prostate ca and hyperlipidemia Follows for: Increased SOB for 10 days - Has 2 days left of pred taper from primary md - -Finished zpak - Wheezing - Chest tightness Finishing prednisone taper and Z-Pak. Can't clear his chest. Cough is productive of yellow sputum with some increase in baseline dyspnea with exertion. No pain or fever.   06/14/2012 Acute OV  Complains of  asthma flare w/ increased SOB, tightness in chest, some wheezing, dry cough, decreased energy, dizziness x3-4weeks - SOB worse x1 week.  has been treated by PCP w/ cheratussin, biaxin 500mg , mucinex dm. PCP gave sample of BReo and Albuterol inhaler.  Was unable to clarithromycin due to stomach pains and nausea.  CXR today shows  Pneumonia in the right middle lobe. No significant wt loss. Denies any hemoptysis, orthopnea, PND, or leg swelling.  06/25/12- 80 yoM never smoker allergic rhinitis, mild asthma, hx sinusitis, complicated by hx prostate ca and hyperlipidemia FOLLOWS FOR: saw TP on 06-14-12 and feeling better; ? CXR needed today. S/p RML bacterial pneumonia 06/14/12- failed to respond to biaxin from PCP because patient stopped early with GI upset. Here changed to Rocephin inj, avelox and Zpak. Now much improved with no F, sweat. Only mild dry cough. Uses rescue inhaler occ if needed, not daily.  CXR 06/15/12 IMPRESSION:  Pneumonia in the right middle lobe.  Original Report Authenticated By: Lorriane Shire, M.D.  12/31/12- 81 yoM never smoker allergic rhinitis, mild asthma, hx sinusitis, complicated by hx prostate ca and hyperlipidemia FOLLOWS FOR: still on allergy vaccine and no reactions. Recent flare up-chest congestion. Not able to cough anything up-feels tight. Considers this "seasonal" exacerbation. Continues allergy vaccine 1:10 GO CXR 06/25/12 IMPRESSION:  Hyperinflation again noted. There is residual streaky infiltrate  in the right middle lobe consistent with improving pneumonia.  Follow-up to complete resolution is recommended. No new infiltrate  is noted.  Original Report Authenticated By: Lahoma Crocker, M.D.  07/03/13- 81 yoM never smoker allergic rhinitis, mild asthma, hx sinusitis, complicated by hx prostate ca and hyperlipidemia   ROS-see HPI Constitutional:   No-   weight loss, night sweats, fevers, chills, fatigue, lassitude. HEENT:   No-  headaches, difficulty swallowing, tooth/dental problems, sore throat,       No-  sneezing, itching, ear ache, nasal congestion, post nasal drip,  CV:  No-   chest pain, orthopnea, PND, swelling in lower extremities, anasarca, dizziness, palpitations Resp: + shortness of breath with exertion or at rest.              No-   productive cough,  + non-productive cough,   No- coughing up of blood.              No-   change in color of mucus.  No- wheezing.   Skin: No-   rash or lesions. GI:  No-   heartburn, indigestion, abdominal pain, nausea, vomiting,  GU:  MS:  No-   joint pain or swelling.   Neuro-     nothing unusual Psych:  No- change in mood or affect. No depression or anxiety.  No memory loss.   OBJ- Physical Exam General- Alert, Oriented, Affect-appropriate, Distress- none acute. Thin. Skin- rash-none, lesions- none, excoriation- none Lymphadenopathy- none Head- atraumatic            Eyes- Gross vision intact, PERRLA, conjunctivae and secretions clear            Ears- Hearing, canals-normal            Nose- Clear, no-Septal dev, mucus, polyps, erosion, perforation             Throat- Mallampati II , mucosa clear , drainage- none, tonsils- atrophic Neck- flexible , trachea midline, no stridor , thyroid nl, carotid no bruit Chest - symmetrical excursion , unlabored           Heart/CV- RRR , no murmur , no gallop  , no rub, nl s1 s2                           - JVD- none , edema- none, stasis changes- none, varices- none           Lung- +distant, wheeze- none, cough+dry , dullness-none, rub- none           Chest wall-  Abd-  Br/ Gen/ Rectal- Not done, not indicated Extrem- cyanosis- none, clubbing, none, atrophy- none, strength- nl Neuro- grossly intact to observation

## 2013-07-03 NOTE — Progress Notes (Signed)
Subjective:    Patient ID: [name], male    DOB: 1931-06-19, 80 y.o.   MRN: [id number]  HPI 10/20/10- 79 yoM never smoker allergic rhinitis, mild asthma, hx sinusitis, complicated by hx prostate ca and hyperlipidemia Last here December 07, 2009- note reviewed Reports 1 month of increased nasal congestion, post nasal drip. Denies sore throat fever. Ears feel stopped up, hearing unchanged. Not using Nasonex, but favors saline nasal spray.  Feels congestion in upper chest, cough productive white mucus. Denies wheeze and has no inhaler.  Several tick bites in recent weeks. His PCP Dr Henrene Pastor did test for tick borne illness and gave doxycycline- just finished- labs negative.  Continues allergy vaccine at 1:10 and claritin   09/23/11- 79 yoM never smoker allergic rhinitis, mild asthma, hx sinusitis, complicated by hx prostate ca and hyperlipidemia  c/o sob, sinus congestion at night, and chest tightness Increased head and chest congestion for the past month blamed on pollen. Dr. Henrene Pastor, primary physician, it nebulizer and prednisone taper 2 weeks ago with temporary improvement. Out of Nasonex. Has a new dog.  03/29/12- 09/23/11- 79 yoM never smoker allergic rhinitis, mild asthma, hx sinusitis, complicated by hx prostate ca and hyperlipidemia Follows for: Increased SOB for 10 days - Has 2 days left of pred taper from primary md - -Finished zpak - Wheezing - Chest tightness Finishing prednisone taper and Z-Pak. Can't clear his chest. Cough is productive of yellow sputum with some increase in baseline dyspnea with exertion. No pain or fever.   06/14/2012 Acute OV  Complains of  asthma flare w/ increased SOB, tightness in chest, some wheezing, dry cough, decreased energy, dizziness x3-4weeks - SOB worse x1 week.  has been treated by PCP w/ cheratussin, biaxin 500mg , mucinex dm. PCP gave sample of BReo and Albuterol inhaler.  Was unable to clarithromycin due to stomach pains and nausea.  CXR today shows  Pneumonia in the right middle lobe. No significant wt loss. Denies any hemoptysis, orthopnea, PND, or leg swelling.  06/25/12- 80 yoM never smoker allergic rhinitis, mild asthma, hx sinusitis, complicated by hx prostate ca and hyperlipidemia FOLLOWS FOR: saw TP on 06-14-12 and feeling better; ? CXR needed today. S/p RML bacterial pneumonia 06/14/12- failed to respond to biaxin from PCP because patient stopped early with GI upset. Here changed to Rocephin inj, avelox and Zpak. Now much improved with no F, sweat. Only mild dry cough. Uses rescue inhaler occ if needed, not daily.  CXR 06/15/12 IMPRESSION:  Pneumonia in the right middle lobe.  Original Report Authenticated By: Lorriane Shire, M.D.  12/31/12- 81 yoM never smoker allergic rhinitis, mild asthma, hx sinusitis, complicated by hx prostate ca and hyperlipidemia FOLLOWS FOR: still on allergy vaccine and no reactions. Recent flare up-chest congestion. Not able to cough anything up-feels tight. Considers this "seasonal" exacerbation. Continues allergy vaccine 1:10 GO CXR 06/25/12 IMPRESSION:  Hyperinflation again noted. There is residual streaky infiltrate  in the right middle lobe consistent with improving pneumonia.  Follow-up to complete resolution is recommended. No new infiltrate  is noted.  Original Report Authenticated By: Lahoma Crocker, M.D.  07/03/13  38 yoM never smoker allergic rhinitis, mild asthma, hx sinusitis, complicated by hx prostate ca and hyperlipidemia FOLLOWS FOR: has been using Mucinex OTC and Claritin as well as allergy vaccine 1:10 GO. Currently having sinus pressure and ear pressure/clogged x 1 week now. Pt states he had a fall-hit his head and went to ER 4 weeks ago.Marland Kitchen  ROS-see HPI Constitutional:  No-   weight loss, night sweats, fevers, chills, fatigue, lassitude. HEENT:   No-  headaches, difficulty swallowing, tooth/dental problems, sore throat,       No-  sneezing, itching, ear ache, +nasal congestion, +post nasal  drip,  CV:  No-   chest pain, orthopnea, PND, swelling in lower extremities, anasarca, dizziness, palpitations Resp: + shortness of breath with exertion or at rest.              No-   productive cough,  + non-productive cough,  No- coughing up of blood.              No-   change in color of mucus.  No- wheezing.   Skin: No-   rash or lesions. GI:  No-   heartburn, indigestion, abdominal pain, nausea, vomiting,  GU:  MS:  No-   joint pain or swelling.   Neuro-     nothing unusual Psych:  No- change in mood or affect. No depression or anxiety.  No memory loss.   OBJ- Physical Exam General- Alert, Oriented, Affect-appropriate, Distress- none acute. Thin. Skin- rash-none, lesions- none, excoriation- none Lymphadenopathy- none Head- atraumatic            Eyes- Gross vision intact, PERRLA, conjunctivae and secretions clear            Ears- L cerumen, R perforated drum- known hx            Nose- Clear, no-Septal dev, mucus, polyps, erosion, perforation             Throat- Mallampati II , mucosa clear , drainage+clear mucus, tonsils- atrophic Neck- flexible , trachea midline, no stridor , thyroid nl, carotid no bruit Chest - symmetrical excursion , unlabored           Heart/CV- RRR , no murmur , no gallop  , no rub, nl s1 s2                           - JVD- none , edema- none, stasis changes- none, varices- none           Lung- +distant, wheeze- none, cough+dry , dullness-none, rub- none           Chest wall-  Abd-  Br/ Gen/ Rectal- Not done, not indicated Extrem- cyanosis- none, clubbing, none, atrophy- none, strength- nl Neuro- grossly intact to observation

## 2013-07-05 MED ORDER — METHYLPREDNISOLONE ACETATE 80 MG/ML IJ SUSP
80.0000 mg | Freq: Once | INTRAMUSCULAR | Status: AC
  Administered 2013-07-05: 80 mg via INTRAMUSCULAR

## 2013-07-05 MED ORDER — PHENYLEPHRINE HCL 1 % NA SOLN
3.0000 [drp] | Freq: Once | NASAL | Status: AC
  Administered 2013-07-05: 3 [drp] via NASAL

## 2013-07-19 DIAGNOSIS — H9209 Otalgia, unspecified ear: Secondary | ICD-10-CM | POA: Diagnosis not present

## 2013-07-19 DIAGNOSIS — H6120 Impacted cerumen, unspecified ear: Secondary | ICD-10-CM | POA: Diagnosis not present

## 2013-07-28 NOTE — Assessment & Plan Note (Signed)
controlled 

## 2013-07-28 NOTE — Assessment & Plan Note (Signed)
Acute rhinitis with eustachian dysfunction Plan-neb nasal decongestant, Depo-Medrol

## 2013-07-28 NOTE — Assessment & Plan Note (Signed)
Watching for bacterial infection

## 2013-08-19 DIAGNOSIS — H5200 Hypermetropia, unspecified eye: Secondary | ICD-10-CM | POA: Diagnosis not present

## 2013-08-19 DIAGNOSIS — H1130 Conjunctival hemorrhage, unspecified eye: Secondary | ICD-10-CM | POA: Diagnosis not present

## 2013-08-22 DIAGNOSIS — R42 Dizziness and giddiness: Secondary | ICD-10-CM | POA: Diagnosis not present

## 2013-09-09 DIAGNOSIS — E785 Hyperlipidemia, unspecified: Secondary | ICD-10-CM | POA: Diagnosis not present

## 2013-09-09 DIAGNOSIS — E039 Hypothyroidism, unspecified: Secondary | ICD-10-CM | POA: Diagnosis not present

## 2013-09-09 DIAGNOSIS — C61 Malignant neoplasm of prostate: Secondary | ICD-10-CM | POA: Diagnosis not present

## 2013-09-16 ENCOUNTER — Ambulatory Visit (INDEPENDENT_AMBULATORY_CARE_PROVIDER_SITE_OTHER): Payer: Medicare Other

## 2013-09-16 DIAGNOSIS — J309 Allergic rhinitis, unspecified: Secondary | ICD-10-CM | POA: Diagnosis not present

## 2013-11-04 DIAGNOSIS — W57XXXA Bitten or stung by nonvenomous insect and other nonvenomous arthropods, initial encounter: Secondary | ICD-10-CM | POA: Diagnosis not present

## 2013-11-04 DIAGNOSIS — S1096XA Insect bite of unspecified part of neck, initial encounter: Secondary | ICD-10-CM | POA: Diagnosis not present

## 2013-11-07 DIAGNOSIS — L259 Unspecified contact dermatitis, unspecified cause: Secondary | ICD-10-CM | POA: Diagnosis not present

## 2013-11-07 DIAGNOSIS — Z681 Body mass index (BMI) 19 or less, adult: Secondary | ICD-10-CM | POA: Diagnosis not present

## 2013-11-07 DIAGNOSIS — T7840XA Allergy, unspecified, initial encounter: Secondary | ICD-10-CM | POA: Diagnosis not present

## 2013-11-15 DIAGNOSIS — R0982 Postnasal drip: Secondary | ICD-10-CM | POA: Diagnosis not present

## 2013-11-15 DIAGNOSIS — R1310 Dysphagia, unspecified: Secondary | ICD-10-CM | POA: Diagnosis not present

## 2013-11-20 DIAGNOSIS — M5137 Other intervertebral disc degeneration, lumbosacral region: Secondary | ICD-10-CM | POA: Diagnosis not present

## 2013-11-20 DIAGNOSIS — M999 Biomechanical lesion, unspecified: Secondary | ICD-10-CM | POA: Diagnosis not present

## 2013-12-13 IMAGING — CR DG CHEST 2V
2 series · 2 of 2 positions shown · non-contrast
Comparison: None.

CLINICAL DATA: Asthma.  Dyspnea.  Shortness of breath with cough
and chest tightness.

CHEST - 2 VIEW

[view not recorded (1 of 2)]
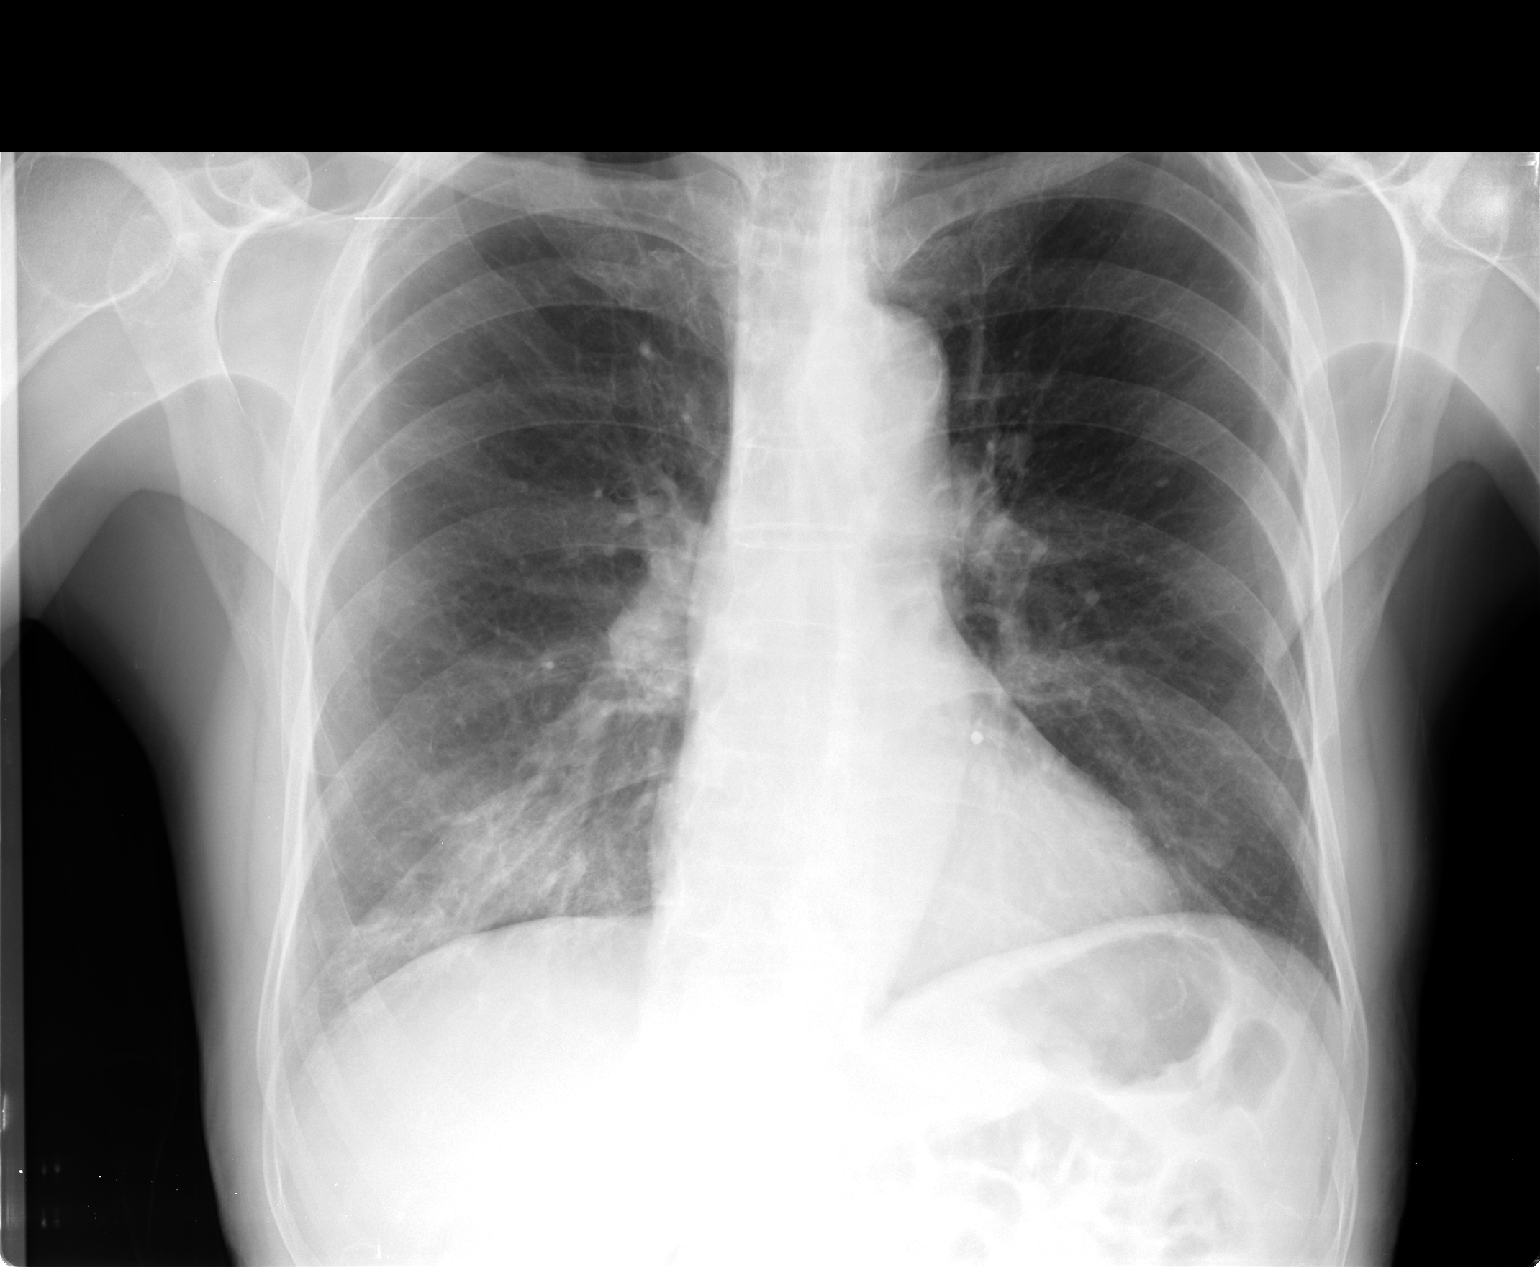

[view not recorded (2 of 2)]
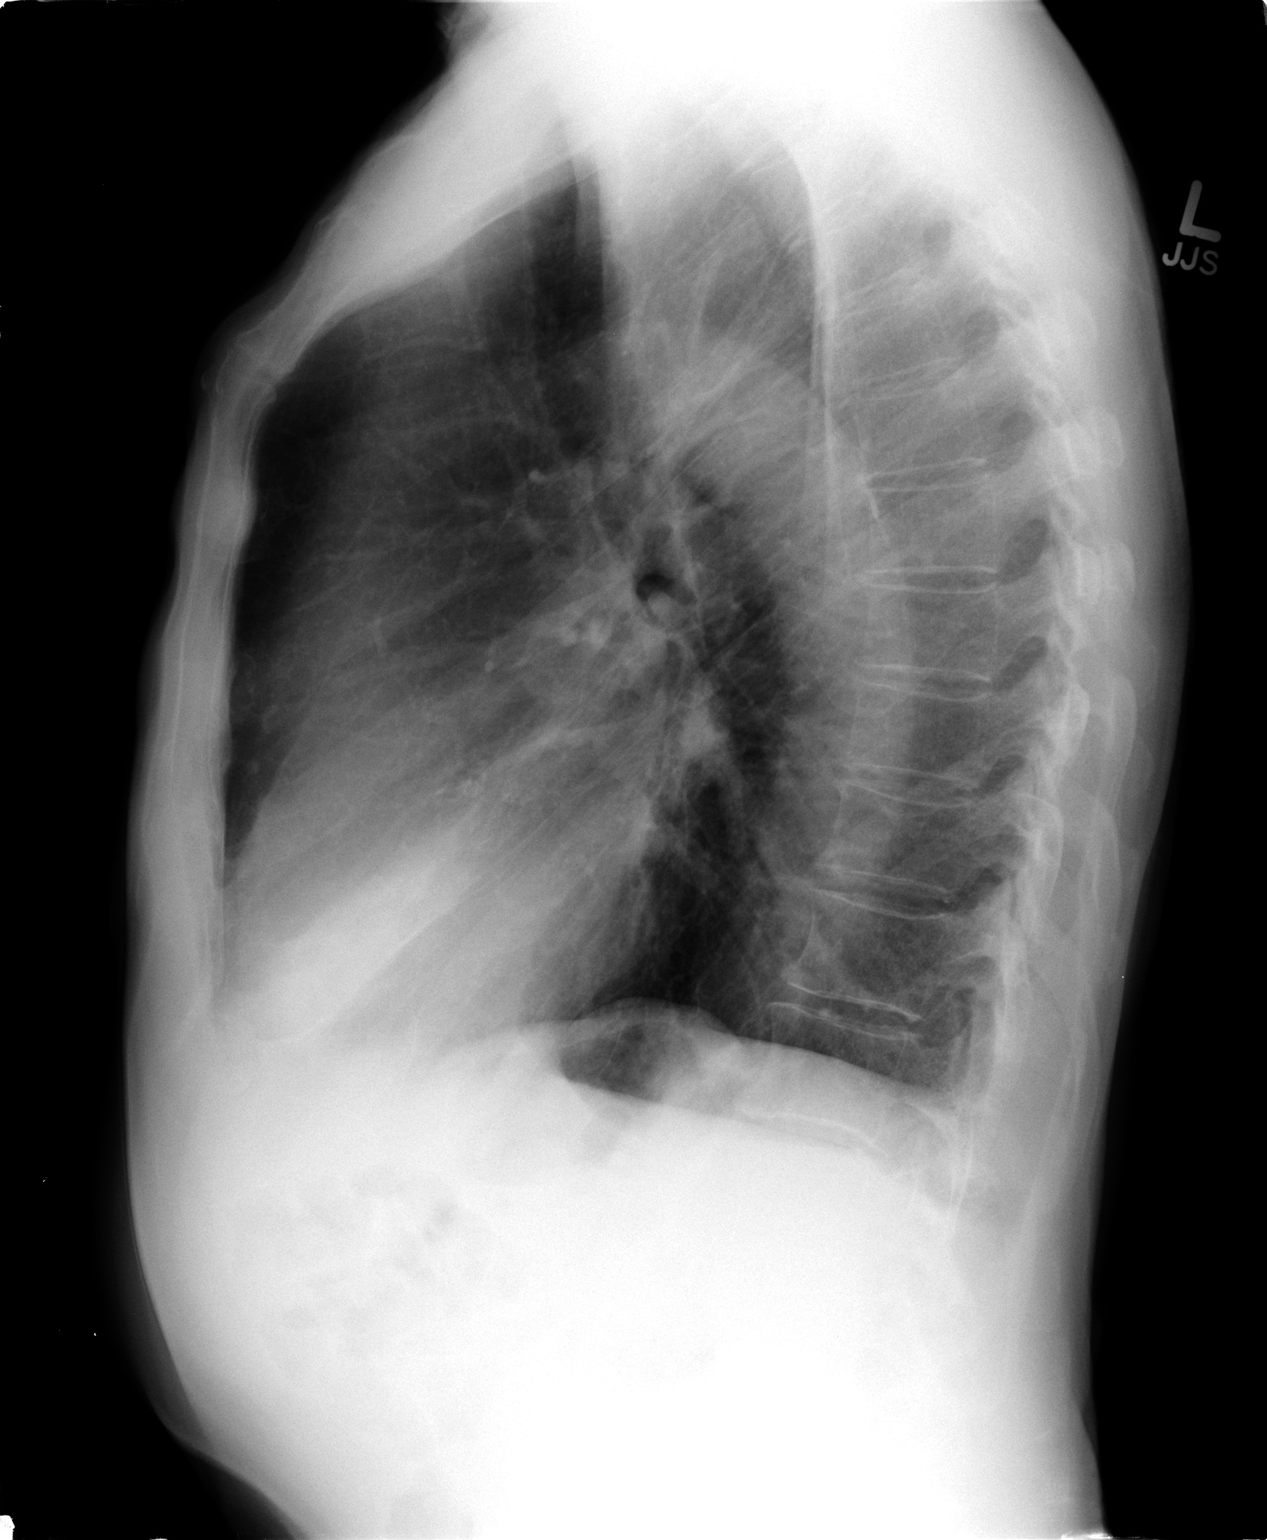

[2 of 2 positions shown; findings below may reference images not displayed]

FINDINGS: There is a focal infiltrate in the right middle lobe.
The lungs are otherwise clear but hyperinflated.  Heart size and
vascularity are normal. No acute osseous abnormality.
IMPRESSION: Pneumonia in the right middle lobe.

## 2013-12-24 IMAGING — CR DG CHEST 2V
2 series · 2 of 2 positions shown · non-contrast
Comparison: 06/24/2012

CLINICAL DATA: Follow up pneumonia

CHEST - 2 VIEW

[view not recorded (1 of 2)]
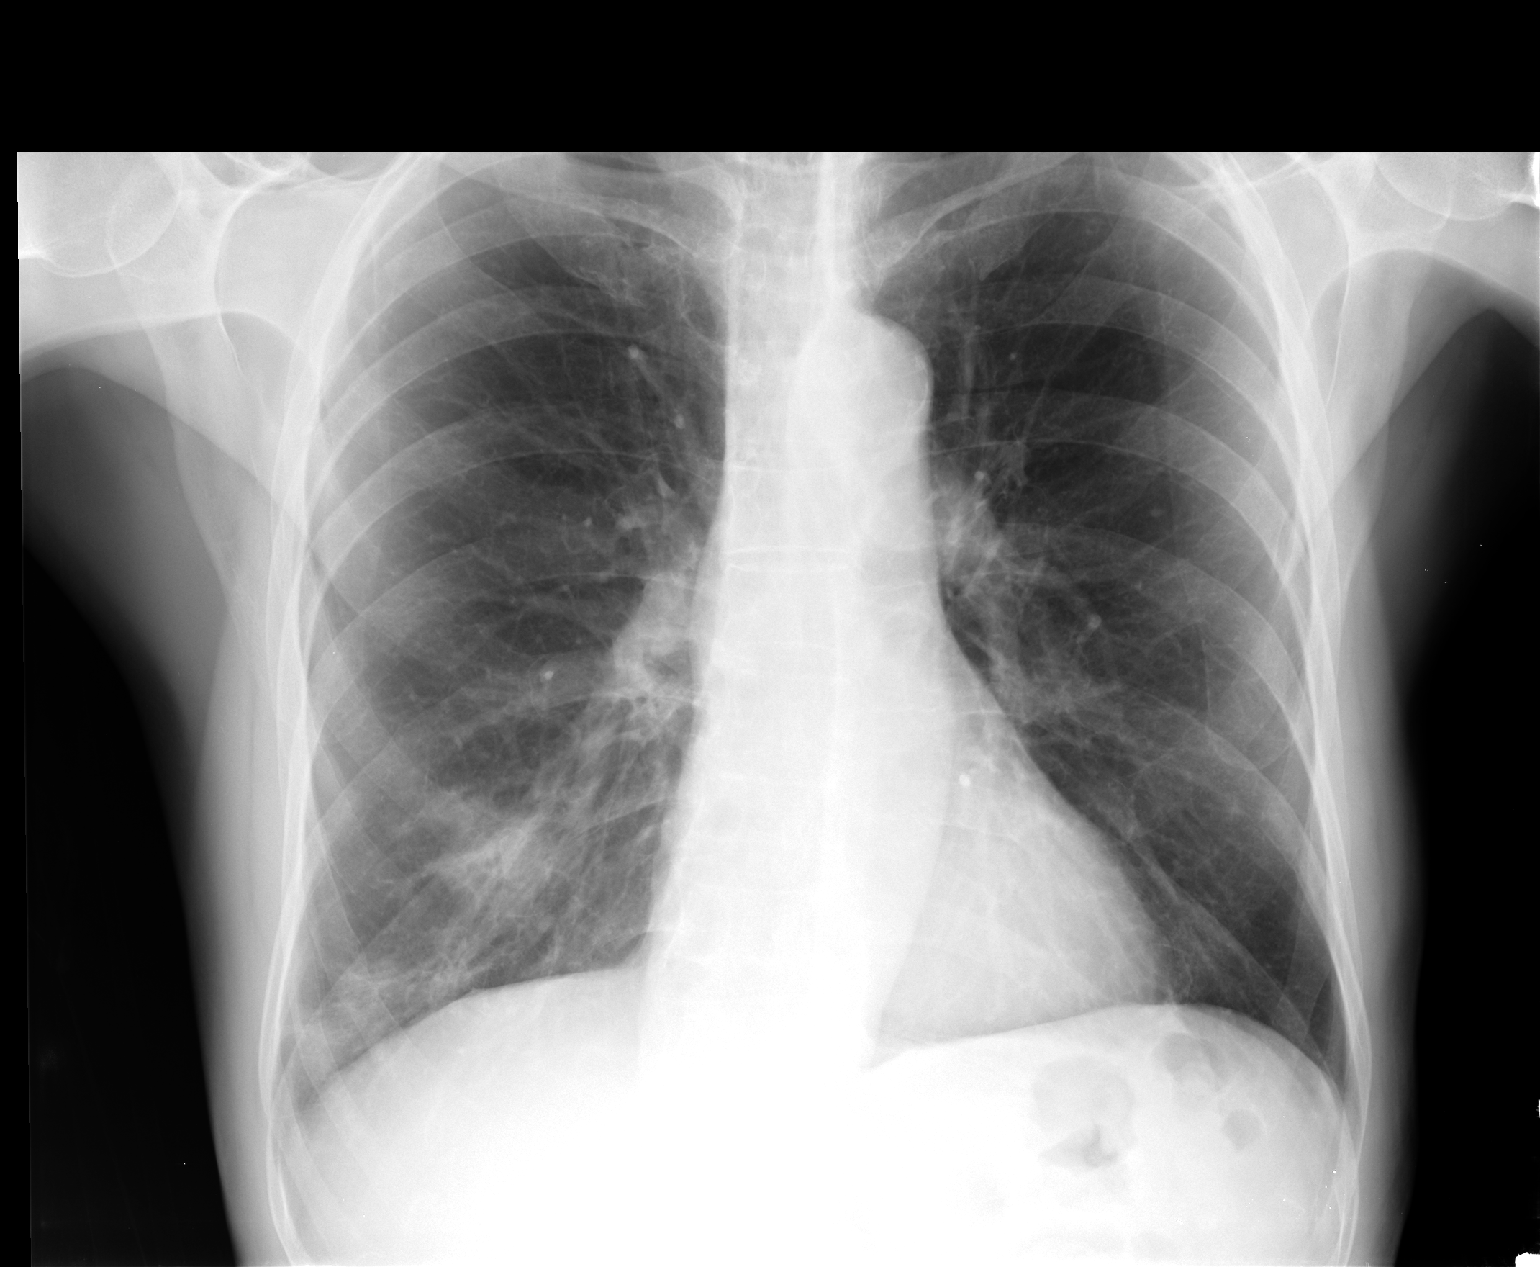

[view not recorded (2 of 2)]
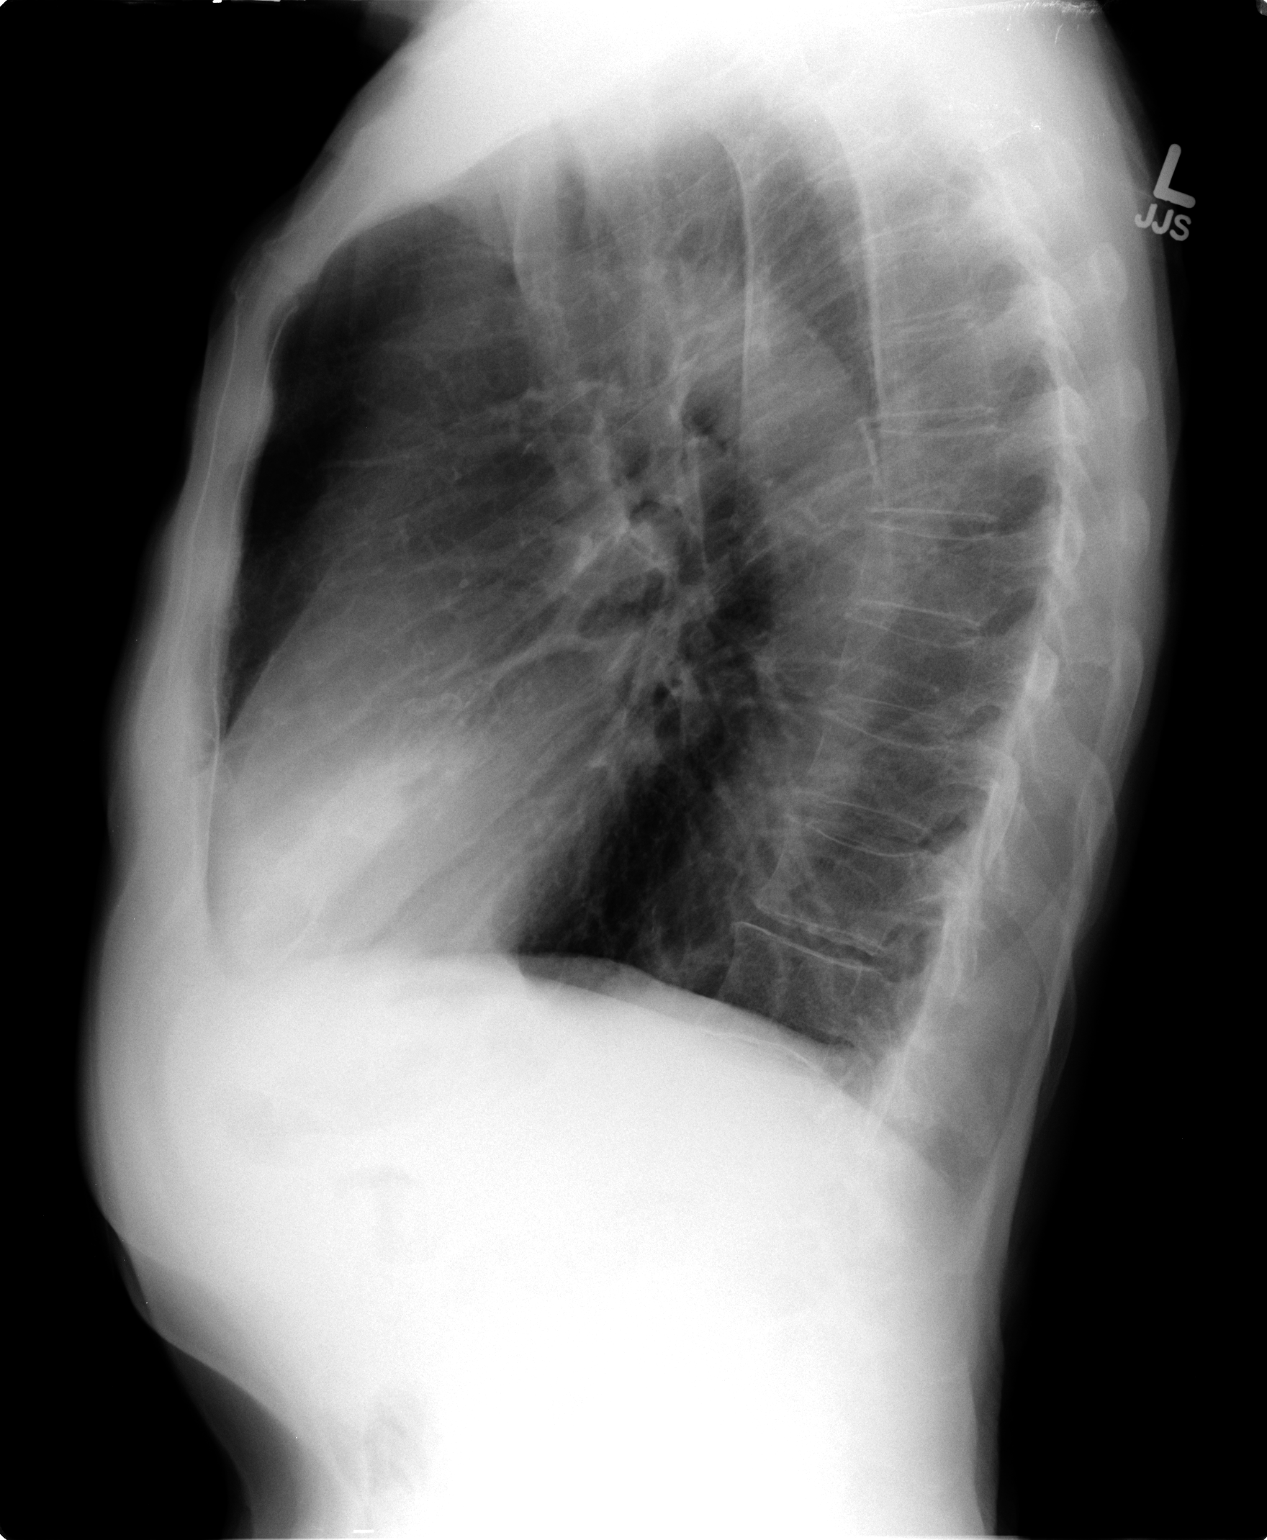

[2 of 2 positions shown; findings below may reference images not displayed]

FINDINGS: Cardiomediastinal silhouette is stable.  Hyperinflation
again noted.  There is residual streaky infiltrate in the right
middle lobe consistent with improving pneumonia.
Follow-up to complete resolution is recommended.  No new infiltrate
is noted.
IMPRESSION: Hyperinflation again noted.  There is residual streaky infiltrate
in the right middle lobe consistent with improving pneumonia.
Follow-up to complete resolution is recommended.  No new infiltrate
is noted.

## 2013-12-31 ENCOUNTER — Encounter: Payer: Self-pay | Admitting: Internal Medicine

## 2013-12-31 ENCOUNTER — Ambulatory Visit: Payer: Medicare Other | Admitting: Internal Medicine

## 2013-12-31 VITALS — BP 120/76 | HR 71 | Ht 71.0 in | Wt 129.0 lb

## 2013-12-31 DIAGNOSIS — J302 Other seasonal allergic rhinitis: Secondary | ICD-10-CM

## 2013-12-31 MED ORDER — ALBUTEROL SULFATE HFA 108 (90 BASE) MCG/ACT IN AERS: 2.0000 | INHALATION_SPRAY | Freq: Four times a day (QID) | RESPIRATORY_TRACT | Status: DC | PRN

## 2013-12-31 NOTE — Progress Notes (Signed)
Subjective:    Patient ID: [name], male    DOB: 10-05-1931, 80 y.o.   MRN: [id number]  HPI 10/20/10- 80 yoM never smoker allergic rhinitis, mild asthma, hx sinusitis, complicated by hx prostate ca and hyperlipidemia Last here December 07, 2009- note reviewed Reports 1 month of increased nasal congestion, post nasal drip. Denies sore throat fever. Ears feel stopped up, hearing unchanged. Not using Nasonex, but favors saline nasal spray.  Feels congestion in upper chest, cough productive white mucus. Denies wheeze and has no inhaler.  Several tick bites in recent weeks. His PCP Dr Henrene Pastor did test for tick borne illness and gave doxycycline- just finished- labs negative.  Continues allergy vaccine at 1:10 and claritin   09/23/11- 80 yoM never smoker allergic rhinitis, mild asthma, hx sinusitis, complicated by hx prostate ca and hyperlipidemia  c/o sob, sinus congestion at night, and chest tightness Increased head and chest congestion for the past month blamed on pollen. Dr. Henrene Pastor, primary physician, it nebulizer and prednisone taper 2 weeks ago with temporary improvement. Out of Nasonex. Has a new dog.  03/29/12- 09/23/11- 80 yoM never smoker allergic rhinitis, mild asthma, hx sinusitis, complicated by hx prostate ca and hyperlipidemia Follows for: Increased SOB for 10 days - Has 2 days left of pred taper from primary md - -Finished zpak - Wheezing - Chest tightness Finishing prednisone taper and Z-Pak. Can't clear his chest. Cough is productive of yellow sputum with some increase in baseline dyspnea with exertion. No pain or fever.   06/14/2012 Acute OV  Complains of  asthma flare w/ increased SOB, tightness in chest, some wheezing, dry cough, decreased energy, dizziness x3-4weeks - SOB worse x1 week.  has been treated by PCP w/ cheratussin, biaxin 500mg , mucinex dm. PCP gave sample of BReo and Albuterol inhaler.  Was unable to clarithromycin due to stomach pains and nausea.  CXR today shows  Pneumonia in the right middle lobe. No significant wt loss. Denies any hemoptysis, orthopnea, PND, or leg swelling.  06/25/12- 80 yoM never smoker allergic rhinitis, mild asthma, hx sinusitis, complicated by hx prostate ca and hyperlipidemia FOLLOWS FOR: saw TP on 06-14-12 and feeling better; ? CXR needed today. S/p RML bacterial pneumonia 06/14/12- failed to respond to biaxin from PCP because patient stopped early with GI upset. Here changed to Rocephin inj, avelox and Zpak. Now much improved with no F, sweat. Only mild dry cough. Uses rescue inhaler occ if needed, not daily.  CXR 06/15/12 IMPRESSION:  Pneumonia in the right middle lobe.  Original Report Authenticated By: Lorriane Shire, M.D.  12/31/12- 80 yoM never smoker allergic rhinitis, mild asthma, hx sinusitis, complicated by hx prostate ca and hyperlipidemia FOLLOWS FOR: still on allergy vaccine and no reactions. Recent flare up-chest congestion. Not able to cough anything up-feels tight. Considers this "seasonal" exacerbation. Continues allergy vaccine 1:10 GO CXR 06/25/12 IMPRESSION:  Hyperinflation again noted. There is residual streaky infiltrate  in the right middle lobe consistent with improving pneumonia.  Follow-up to complete resolution is recommended. No new infiltrate  is noted.  Original Report Authenticated By: Lahoma Crocker, M.D.  07/03/13  80 yoM never smoker allergic rhinitis, mild asthma, hx sinusitis, complicated by hx prostate ca and hyperlipidemia FOLLOWS FOR: has been using Mucinex OTC and Claritin as well as allergy vaccine 1:10 GO. Currently having sinus pressure and ear pressure/clogged x 1 week now. Pt states he had a fall-hit his head and went to ER 4 weeks ago..  12/31/13- 80 yoM never  smoker allergic rhinitis, mild asthma, hx sinusitis, complicated by hx prostate ca and hyperlipidemia FOLLOWS FOR: Continues to get Allergy injections 1:10 at his PCP office. Pt states he has had to use his Flonase rx more in the past 30  days; continues his Claritin as well.    ROS-see HPI Constitutional:   No-   weight loss, night sweats, fevers, chills, fatigue, lassitude. HEENT:   No-  headaches, difficulty swallowing, tooth/dental problems, sore throat,       No-  sneezing, itching, ear ache, +nasal congestion, +post nasal drip,  CV:  No-   chest pain, orthopnea, PND, swelling in lower extremities, anasarca, dizziness, palpitations Resp: + shortness of breath with exertion or at rest.              No-   productive cough,  + non-productive cough,  No- coughing up of blood.              No-   change in color of mucus.  No- wheezing.   Skin: No-   rash or lesions. GI:  No-   heartburn, indigestion, abdominal pain, nausea, vomiting,  GU:  MS:  No-   joint pain or swelling.   Neuro-     nothing unusual Psych:  No- change in mood or affect. No depression or anxiety.  No memory loss.   OBJ- Physical Exam General- Alert, Oriented, Affect-appropriate, Distress- none acute. Thin. Skin- rash-none, lesions- none, excoriation- none Lymphadenopathy- none Head- atraumatic            Eyes- Gross vision intact, PERRLA, conjunctivae and secretions clear            Ears- L cerumen, R perforated drum- known hx            Nose- Clear, no-Septal dev, mucus, polyps, erosion, perforation             Throat- Mallampati II , mucosa clear , drainage+clear mucus, tonsils- atrophic Neck- flexible , trachea midline, no stridor , thyroid nl, carotid no bruit Chest - symmetrical excursion , unlabored           Heart/CV- RRR , no murmur , no gallop  , no rub, nl s1 s2                           - JVD- none , edema- none, stasis changes- none, varices- none           Lung- +distant, wheeze- none, cough+dry , dullness-none, rub- none           Chest wall-  Abd-  Br/ Gen/ Rectal- Not done, not indicated Extrem- cyanosis- none, clubbing, none, atrophy- none, strength- nl Neuro- grossly intact to observation

## 2013-12-31 NOTE — Patient Instructions (Signed)
We can continue allergy vaccine 1:10 weekly  Ok to use the Flonase and Claritin as you have been doing  Script printed for albuterol rescue inhaler for occasional use for asthma- if you get tight or short of breath. You can ask the drug store to put it on hold for you in their file, if you don't want to fill it right now.

## 2014-01-09 DIAGNOSIS — T6391XA Toxic effect of contact with unspecified venomous animal, accidental (unintentional), initial encounter: Secondary | ICD-10-CM | POA: Diagnosis not present

## 2014-01-09 DIAGNOSIS — L299 Pruritus, unspecified: Secondary | ICD-10-CM | POA: Diagnosis not present

## 2014-01-14 DIAGNOSIS — Z681 Body mass index (BMI) 19 or less, adult: Secondary | ICD-10-CM | POA: Diagnosis not present

## 2014-01-14 DIAGNOSIS — L301 Dyshidrosis [pompholyx]: Secondary | ICD-10-CM | POA: Diagnosis not present

## 2014-01-22 DIAGNOSIS — M999 Biomechanical lesion, unspecified: Secondary | ICD-10-CM | POA: Diagnosis not present

## 2014-01-22 DIAGNOSIS — M5137 Other intervertebral disc degeneration, lumbosacral region: Secondary | ICD-10-CM | POA: Diagnosis not present

## 2014-01-28 DIAGNOSIS — M5137 Other intervertebral disc degeneration, lumbosacral region: Secondary | ICD-10-CM | POA: Diagnosis not present

## 2014-01-28 DIAGNOSIS — M999 Biomechanical lesion, unspecified: Secondary | ICD-10-CM | POA: Diagnosis not present

## 2014-01-30 ENCOUNTER — Ambulatory Visit (INDEPENDENT_AMBULATORY_CARE_PROVIDER_SITE_OTHER): Payer: Medicare Other

## 2014-01-30 DIAGNOSIS — J309 Allergic rhinitis, unspecified: Secondary | ICD-10-CM | POA: Diagnosis not present

## 2014-02-13 DIAGNOSIS — E785 Hyperlipidemia, unspecified: Secondary | ICD-10-CM | POA: Diagnosis not present

## 2014-02-13 DIAGNOSIS — Z681 Body mass index (BMI) 19 or less, adult: Secondary | ICD-10-CM | POA: Diagnosis not present

## 2014-02-13 DIAGNOSIS — C61 Malignant neoplasm of prostate: Secondary | ICD-10-CM | POA: Diagnosis not present

## 2014-02-13 DIAGNOSIS — M810 Age-related osteoporosis without current pathological fracture: Secondary | ICD-10-CM | POA: Diagnosis not present

## 2014-02-13 DIAGNOSIS — E039 Hypothyroidism, unspecified: Secondary | ICD-10-CM | POA: Diagnosis not present

## 2014-02-27 ENCOUNTER — Telehealth: Payer: Self-pay | Admitting: Internal Medicine

## 2014-02-27 NOTE — Telephone Encounter (Signed)
Spoke with pt. C/o nasal congestion, PND, sinus pressure, ears feel clogged. No cough, no SOB, no f/c/s/n/v. Pt is taking claritin and flonase daily. Pt is wanting something called in. Please advise CDY thanks  Allergies  Allergen Reactions  . Clarithromycin     REACTION: GI upset     Current Outpatient Prescriptions on File Prior to Visit  Medication Sig Dispense Refill  . albuterol (PROVENTIL HFA;VENTOLIN HFA) 108 (90 BASE) MCG/ACT inhaler Inhale 2 puffs into the lungs every 6 (six) hours as needed for wheezing or shortness of breath.  1 Inhaler  prn  . calcium carbonate (OS-CAL) 600 MG TABS tablet Take 600 mg by mouth daily with breakfast.      . ferrous sulfate 325 (65 FE) MG tablet Take 325 mg by mouth daily with breakfast.      . fluticasone (FLONASE) 50 MCG/ACT nasal spray 2 sprays each nostril every night at bedtime while needed  16 g  prn  . levothyroxine (SYNTHROID, LEVOTHROID) 137 MCG tablet Take 137 mcg by mouth daily before breakfast.      . loratadine (CLARITIN) 10 MG tablet Take 10 mg by mouth daily.        Marland Kitchen UNABLE TO FIND Med Name: Allergy injection once a week       No current facility-administered medications on file prior to visit.

## 2014-02-28 NOTE — Telephone Encounter (Signed)
Handwritten note yesterday while computers down to give pt prednisone 20 mg daily x 5 days

## 2014-02-28 NOTE — Telephone Encounter (Signed)
Caryl Pina called pt last night and called RX in. Called [facility] and confirmed pt already picked this up

## 2014-02-28 NOTE — Telephone Encounter (Signed)
ATC  X 2 and line was busy, [facility]

## 2014-03-20 ENCOUNTER — Encounter: Payer: Self-pay | Admitting: Internal Medicine

## 2014-04-09 DIAGNOSIS — Z85828 Personal history of other malignant neoplasm of skin: Secondary | ICD-10-CM | POA: Diagnosis not present

## 2014-04-09 DIAGNOSIS — B354 Tinea corporis: Secondary | ICD-10-CM | POA: Diagnosis not present

## 2014-05-13 ENCOUNTER — Telehealth: Payer: Self-pay | Admitting: Internal Medicine

## 2014-05-13 DIAGNOSIS — Z85828 Personal history of other malignant neoplasm of skin: Secondary | ICD-10-CM | POA: Diagnosis not present

## 2014-05-13 DIAGNOSIS — L308 Other specified dermatitis: Secondary | ICD-10-CM | POA: Diagnosis not present

## 2014-05-13 MED ORDER — AMOXICILLIN-POT CLAVULANATE 875-125 MG PO TABS: 1.0000 | ORAL_TABLET | Freq: Two times a day (BID) | ORAL | Status: DC

## 2014-05-13 NOTE — Telephone Encounter (Signed)
Called and spoke to pt's wife. Rx sent to preferred pharmacy. Pt's wife verbalized understanding and denied any further questions or concerns at this time.

## 2014-05-13 NOTE — Telephone Encounter (Signed)
Spoke with pt. States that he thinks he is getting an upper respiratory infection. Reports severe sinus congestion, cough and sore throat. Denies fever. Onset was 24 hours ago. Would like something called in.  Allergies  Allergen Reactions  . Clarithromycin     REACTION: GI upset   CY - please advise. Thanks.

## 2014-05-13 NOTE — Telephone Encounter (Signed)
Offer augmentin 875, # 20, 1 twice daily 

## 2014-05-15 DIAGNOSIS — M9903 Segmental and somatic dysfunction of lumbar region: Secondary | ICD-10-CM | POA: Diagnosis not present

## 2014-05-15 DIAGNOSIS — M5136 Other intervertebral disc degeneration, lumbar region: Secondary | ICD-10-CM | POA: Diagnosis not present

## 2014-05-28 DIAGNOSIS — M9903 Segmental and somatic dysfunction of lumbar region: Secondary | ICD-10-CM | POA: Diagnosis not present

## 2014-05-28 DIAGNOSIS — M5136 Other intervertebral disc degeneration, lumbar region: Secondary | ICD-10-CM | POA: Diagnosis not present

## 2014-05-29 DIAGNOSIS — L84 Corns and callosities: Secondary | ICD-10-CM | POA: Diagnosis not present

## 2014-05-29 DIAGNOSIS — D225 Melanocytic nevi of trunk: Secondary | ICD-10-CM | POA: Diagnosis not present

## 2014-05-29 DIAGNOSIS — C44319 Basal cell carcinoma of skin of other parts of face: Secondary | ICD-10-CM | POA: Diagnosis not present

## 2014-05-29 DIAGNOSIS — Z85828 Personal history of other malignant neoplasm of skin: Secondary | ICD-10-CM | POA: Diagnosis not present

## 2014-05-29 DIAGNOSIS — B351 Tinea unguium: Secondary | ICD-10-CM | POA: Diagnosis not present

## 2014-05-29 DIAGNOSIS — D2261 Melanocytic nevi of right upper limb, including shoulder: Secondary | ICD-10-CM | POA: Diagnosis not present

## 2014-05-29 DIAGNOSIS — D485 Neoplasm of uncertain behavior of skin: Secondary | ICD-10-CM | POA: Diagnosis not present

## 2014-05-29 DIAGNOSIS — L308 Other specified dermatitis: Secondary | ICD-10-CM | POA: Diagnosis not present

## 2014-06-04 ENCOUNTER — Ambulatory Visit (INDEPENDENT_AMBULATORY_CARE_PROVIDER_SITE_OTHER): Payer: Medicare Other

## 2014-06-04 DIAGNOSIS — J309 Allergic rhinitis, unspecified: Secondary | ICD-10-CM | POA: Diagnosis not present

## 2014-06-06 DIAGNOSIS — C44319 Basal cell carcinoma of skin of other parts of face: Secondary | ICD-10-CM | POA: Diagnosis not present

## 2014-06-06 DIAGNOSIS — Z85828 Personal history of other malignant neoplasm of skin: Secondary | ICD-10-CM | POA: Diagnosis not present

## 2014-07-02 ENCOUNTER — Encounter: Payer: Self-pay | Admitting: Internal Medicine

## 2014-07-02 ENCOUNTER — Ambulatory Visit (INDEPENDENT_AMBULATORY_CARE_PROVIDER_SITE_OTHER): Payer: Medicare Other | Admitting: Internal Medicine

## 2014-07-02 VITALS — BP 132/84 | HR 76 | Ht 72.0 in | Wt 132.0 lb

## 2014-07-02 DIAGNOSIS — J4520 Mild intermittent asthma, uncomplicated: Secondary | ICD-10-CM | POA: Diagnosis not present

## 2014-07-02 DIAGNOSIS — J309 Allergic rhinitis, unspecified: Secondary | ICD-10-CM | POA: Diagnosis not present

## 2014-07-02 DIAGNOSIS — J3089 Other allergic rhinitis: Principal | ICD-10-CM

## 2014-07-02 DIAGNOSIS — J302 Other seasonal allergic rhinitis: Secondary | ICD-10-CM

## 2014-07-02 NOTE — Assessment & Plan Note (Signed)
Continues to do well with allergy vaccine. He has flonase and claritin which he can use through pollen season as discussed.

## 2014-07-02 NOTE — Patient Instructions (Signed)
We can continue allergy vaccine 1:10 GO  Ok to use the claritin and flonase. If these aren't enough, please let us know

## 2014-07-02 NOTE — Assessment & Plan Note (Signed)
Rarely even needs rescue inhaler. Well controlled. Discussed interaction with his rhinitis. Plan- keep rescue inhaler available in case needed.

## 2014-07-02 NOTE — Progress Notes (Signed)
Subjective:    Patient ID: Drexel Iha, male    DOB: 12/26/1931, 81 y.o.   MRN: [id number]  HPI 10/20/10- 79 yoM never smoker allergic rhinitis, mild asthma, hx sinusitis, complicated by hx prostate ca and hyperlipidemia Last here December 07, 2009- note reviewed Reports 1 month of increased nasal congestion, post nasal drip. Denies sore throat fever. Ears feel stopped up, hearing unchanged. Not using Nasonex, but favors saline nasal spray.  Feels congestion in upper chest, cough productive white mucus. Denies wheeze and has no inhaler.  Several tick bites in recent weeks. His PCP Dr Henrene Pastor did test for tick borne illness and gave doxycycline- just finished- labs negative.  Continues allergy vaccine at 1:10 and claritin   09/23/11- 79 yoM never smoker allergic rhinitis, mild asthma, hx sinusitis, complicated by hx prostate ca and hyperlipidemia  c/o sob, sinus congestion at night, and chest tightness Increased head and chest congestion for the past month blamed on pollen. Dr. Henrene Pastor, primary physician, it nebulizer and prednisone taper 2 weeks ago with temporary improvement. Out of Nasonex. Has a new dog.  03/29/12- 09/23/11- 79 yoM never smoker allergic rhinitis, mild asthma, hx sinusitis, complicated by hx prostate ca and hyperlipidemia Follows for: Increased SOB for 10 days - Has 2 days left of pred taper from primary md - -Finished zpak - Wheezing - Chest tightness Finishing prednisone taper and Z-Pak. Can't clear his chest. Cough is productive of yellow sputum with some increase in baseline dyspnea with exertion. No pain or fever.   06/14/2012 Acute OV  Complains of  asthma flare w/ increased SOB, tightness in chest, some wheezing, dry cough, decreased energy, dizziness x3-4weeks - SOB worse x1 week.  has been treated by PCP w/ cheratussin, biaxin 500mg , mucinex dm. PCP gave sample of BReo and Albuterol inhaler.  Was unable to clarithromycin due to stomach pains and nausea.  CXR today shows  Pneumonia in the right middle lobe. No significant wt loss. Denies any hemoptysis, orthopnea, PND, or leg swelling.  06/25/12- 80 yoM never smoker allergic rhinitis, mild asthma, hx sinusitis, complicated by hx prostate ca and hyperlipidemia FOLLOWS FOR: saw TP on 06-14-12 and feeling better; ? CXR needed today. S/p RML bacterial pneumonia 06/14/12- failed to respond to biaxin from PCP because patient stopped early with GI upset. Here changed to Rocephin inj, avelox and Zpak. Now much improved with no F, sweat. Only mild dry cough. Uses rescue inhaler occ if needed, not daily.  CXR 06/15/12 IMPRESSION:  Pneumonia in the right middle lobe.  Original Report Authenticated By: Lorriane Shire, M.D.  12/31/12- 81 yoM never smoker allergic rhinitis, mild asthma, hx sinusitis, complicated by hx prostate ca and hyperlipidemia FOLLOWS FOR: still on allergy vaccine and no reactions. Recent flare up-chest congestion. Not able to cough anything up-feels tight. Considers this "seasonal" exacerbation. Continues allergy vaccine 1:10 GO CXR 06/25/12 IMPRESSION:  Hyperinflation again noted. There is residual streaky infiltrate  in the right middle lobe consistent with improving pneumonia.  Follow-up to complete resolution is recommended. No new infiltrate  is noted.  Original Report Authenticated By: Lahoma Crocker, M.D.  07/03/13  46 yoM never smoker allergic rhinitis, mild asthma, hx sinusitis, complicated by hx prostate ca and hyperlipidemia FOLLOWS FOR: has been using Mucinex OTC and Claritin as well as allergy vaccine 1:10 GO. Currently having sinus pressure and ear pressure/clogged x 1 week now. Pt states he had a fall-hit his head and went to ER 4 weeks ago..  12/31/13- 24 yoM never  smoker allergic rhinitis, mild asthma, hx sinusitis, complicated by hx prostate ca and hyperlipidemia FOLLOWS FOR: Continues to get Allergy injections 1:10 at his PCP office. Pt states he has had to use his Flonase rx more in the past 30  days; continues his Claritin as well.  07/02/14- 59 yoM never smoker allergic rhinitis, mild asthma, hx sinusitis, complicated by hx prostate ca and hyperlipidemia Chief Complaint  Patient presents with  . Follow-up    asthma and allergies; no problems w/asthma; allergies are good until enviromental allergies flare up; no SOB; seldom cough  Allergy vaccine 1:10 GO/ by RN Blames Spring pollen for persistent postnasal drip- discussed.  Has Flonase/ claritin- to start using again. Denies cough, wheeze or need for rescue inhaler.   ROS-see HPI Constitutional:   No-   weight loss, night sweats, fevers, chills, fatigue, lassitude. HEENT:   No-  headaches, difficulty swallowing, tooth/dental problems, sore throat,       No-  sneezing, itching, ear ache, +nasal congestion, +post nasal drip,  CV:  No-   chest pain, orthopnea, PND, swelling in lower extremities, anasarca, dizziness, palpitations Resp: + shortness of breath with exertion or at rest.              No-   productive cough,  + non-productive cough,  No- coughing up of blood.              No-   change in color of mucus.  No- wheezing.   Skin: No-   rash or lesions. GI:  No-   heartburn, indigestion, abdominal pain, nausea, vomiting,  GU:  MS:  No-   joint pain or swelling.   Neuro-     nothing unusual Psych:  No- change in mood or affect. No depression or anxiety.  No memory loss.  OBJ- Physical Exam General- Alert, Oriented, Affect-appropriate, Distress- none acute. Thin. Skin- rash-none, lesions- none, excoriation- none Lymphadenopathy- none Head- atraumatic            Eyes- Gross vision intact, PERRLA, conjunctivae and secretions clear            Ears- L cerumen, R perforated drum- known hx            Nose- Clear, no-Septal dev, mucus, polyps, erosion, perforation             Throat- Mallampati II , mucosa clear , drainage-none seen, tonsils- atrophic Neck- flexible , trachea midline, no stridor , thyroid nl, carotid no  bruit Chest - symmetrical excursion , unlabored           Heart/CV- RRR , no murmur , no gallop  , no rub, nl s1 s2                           - JVD- none , edema- none, stasis changes- none, varices- none           Lung- +distant, wheeze- none, cough+dry , dullness-none, rub- none           Chest wall-  Abd-  Br/ Gen/ Rectal- Not done, not indicated Extrem- cyanosis- none, clubbing, none, atrophy- none, strength- nl Neuro- grossly intact to observation

## 2014-07-23 DIAGNOSIS — M9903 Segmental and somatic dysfunction of lumbar region: Secondary | ICD-10-CM | POA: Diagnosis not present

## 2014-07-23 DIAGNOSIS — M5136 Other intervertebral disc degeneration, lumbar region: Secondary | ICD-10-CM | POA: Diagnosis not present

## 2014-08-16 DIAGNOSIS — H1033 Unspecified acute conjunctivitis, bilateral: Secondary | ICD-10-CM | POA: Diagnosis not present

## 2014-08-18 DIAGNOSIS — M810 Age-related osteoporosis without current pathological fracture: Secondary | ICD-10-CM | POA: Diagnosis not present

## 2014-08-18 DIAGNOSIS — E785 Hyperlipidemia, unspecified: Secondary | ICD-10-CM | POA: Diagnosis not present

## 2014-08-18 DIAGNOSIS — C61 Malignant neoplasm of prostate: Secondary | ICD-10-CM | POA: Diagnosis not present

## 2014-08-18 DIAGNOSIS — E039 Hypothyroidism, unspecified: Secondary | ICD-10-CM | POA: Diagnosis not present

## 2014-08-18 DIAGNOSIS — N400 Enlarged prostate without lower urinary tract symptoms: Secondary | ICD-10-CM | POA: Diagnosis not present

## 2014-08-18 DIAGNOSIS — R899 Unspecified abnormal finding in specimens from other organs, systems and tissues: Secondary | ICD-10-CM | POA: Diagnosis not present

## 2014-09-02 DIAGNOSIS — L03119 Cellulitis of unspecified part of limb: Secondary | ICD-10-CM | POA: Diagnosis not present

## 2014-09-02 DIAGNOSIS — Z681 Body mass index (BMI) 19 or less, adult: Secondary | ICD-10-CM | POA: Diagnosis not present

## 2014-09-02 DIAGNOSIS — W57XXXA Bitten or stung by nonvenomous insect and other nonvenomous arthropods, initial encounter: Secondary | ICD-10-CM | POA: Diagnosis not present

## 2014-09-23 DIAGNOSIS — W57XXXA Bitten or stung by nonvenomous insect and other nonvenomous arthropods, initial encounter: Secondary | ICD-10-CM | POA: Diagnosis not present

## 2014-09-23 DIAGNOSIS — L02419 Cutaneous abscess of limb, unspecified: Secondary | ICD-10-CM | POA: Diagnosis not present

## 2014-09-23 DIAGNOSIS — Z681 Body mass index (BMI) 19 or less, adult: Secondary | ICD-10-CM | POA: Diagnosis not present

## 2014-10-14 DIAGNOSIS — M9903 Segmental and somatic dysfunction of lumbar region: Secondary | ICD-10-CM | POA: Diagnosis not present

## 2014-10-14 DIAGNOSIS — M5136 Other intervertebral disc degeneration, lumbar region: Secondary | ICD-10-CM | POA: Diagnosis not present

## 2014-10-15 DIAGNOSIS — T63481A Toxic effect of venom of other arthropod, accidental (unintentional), initial encounter: Secondary | ICD-10-CM | POA: Diagnosis not present

## 2014-10-15 DIAGNOSIS — Z681 Body mass index (BMI) 19 or less, adult: Secondary | ICD-10-CM | POA: Diagnosis not present

## 2014-10-15 DIAGNOSIS — W57XXXA Bitten or stung by nonvenomous insect and other nonvenomous arthropods, initial encounter: Secondary | ICD-10-CM | POA: Diagnosis not present

## 2014-10-16 ENCOUNTER — Ambulatory Visit (INDEPENDENT_AMBULATORY_CARE_PROVIDER_SITE_OTHER): Payer: Medicare Other

## 2014-10-16 ENCOUNTER — Telehealth: Payer: Self-pay | Admitting: Internal Medicine

## 2014-10-16 DIAGNOSIS — J309 Allergic rhinitis, unspecified: Secondary | ICD-10-CM

## 2014-10-16 NOTE — Telephone Encounter (Signed)
Date Mixed: 10/16/2014 Vial: AB Strength: 1:10 Here/Mail/Pick Up: Mail Mixed By: Desmond Dike, CMA

## 2014-10-20 DIAGNOSIS — Z0000 Encounter for general adult medical examination without abnormal findings: Secondary | ICD-10-CM | POA: Diagnosis not present

## 2014-10-20 DIAGNOSIS — C61 Malignant neoplasm of prostate: Secondary | ICD-10-CM | POA: Diagnosis not present

## 2014-10-27 DIAGNOSIS — M5136 Other intervertebral disc degeneration, lumbar region: Secondary | ICD-10-CM | POA: Diagnosis not present

## 2014-10-27 DIAGNOSIS — M9903 Segmental and somatic dysfunction of lumbar region: Secondary | ICD-10-CM | POA: Diagnosis not present

## 2014-10-29 DIAGNOSIS — M810 Age-related osteoporosis without current pathological fracture: Secondary | ICD-10-CM | POA: Diagnosis not present

## 2014-10-29 DIAGNOSIS — Z13820 Encounter for screening for osteoporosis: Secondary | ICD-10-CM | POA: Diagnosis not present

## 2014-11-05 DIAGNOSIS — M810 Age-related osteoporosis without current pathological fracture: Secondary | ICD-10-CM | POA: Diagnosis not present

## 2014-11-05 DIAGNOSIS — Z681 Body mass index (BMI) 19 or less, adult: Secondary | ICD-10-CM | POA: Diagnosis not present

## 2014-11-20 NOTE — Telephone Encounter (Signed)
Date Mixed: 10/16/14 Vial: 2 Strength: 1:10 Here/Mail/Pick Up: mail Mixed By: Laurette Schimke

## 2014-12-16 DIAGNOSIS — Z681 Body mass index (BMI) 19 or less, adult: Secondary | ICD-10-CM | POA: Diagnosis not present

## 2014-12-16 DIAGNOSIS — I10 Essential (primary) hypertension: Secondary | ICD-10-CM | POA: Diagnosis not present

## 2014-12-16 DIAGNOSIS — M47816 Spondylosis without myelopathy or radiculopathy, lumbar region: Secondary | ICD-10-CM | POA: Diagnosis not present

## 2014-12-16 DIAGNOSIS — M810 Age-related osteoporosis without current pathological fracture: Secondary | ICD-10-CM | POA: Diagnosis not present

## 2014-12-16 DIAGNOSIS — E785 Hyperlipidemia, unspecified: Secondary | ICD-10-CM | POA: Diagnosis not present

## 2014-12-31 ENCOUNTER — Encounter: Payer: Self-pay | Admitting: Internal Medicine

## 2014-12-31 ENCOUNTER — Ambulatory Visit (INDEPENDENT_AMBULATORY_CARE_PROVIDER_SITE_OTHER): Payer: Medicare Other | Admitting: Internal Medicine

## 2014-12-31 VITALS — BP 134/66 | HR 76 | Ht 72.0 in | Wt 127.0 lb

## 2014-12-31 DIAGNOSIS — J302 Other seasonal allergic rhinitis: Secondary | ICD-10-CM

## 2014-12-31 DIAGNOSIS — J4520 Mild intermittent asthma, uncomplicated: Secondary | ICD-10-CM

## 2014-12-31 DIAGNOSIS — J309 Allergic rhinitis, unspecified: Secondary | ICD-10-CM | POA: Diagnosis not present

## 2014-12-31 DIAGNOSIS — Z23 Encounter for immunization: Secondary | ICD-10-CM | POA: Diagnosis not present

## 2014-12-31 DIAGNOSIS — J3089 Other allergic rhinitis: Secondary | ICD-10-CM

## 2014-12-31 MED ORDER — LEVALBUTEROL HCL 0.63 MG/3ML IN NEBU
0.6300 mg | INHALATION_SOLUTION | Freq: Once | RESPIRATORY_TRACT | Status: AC
  Administered 2014-12-31: 0.63 mg via RESPIRATORY_TRACT

## 2014-12-31 MED ORDER — ALBUTEROL SULFATE HFA 108 (90 BASE) MCG/ACT IN AERS: 2.0000 | INHALATION_SPRAY | Freq: Four times a day (QID) | RESPIRATORY_TRACT | Status: DC | PRN

## 2014-12-31 NOTE — Assessment & Plan Note (Signed)
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 Plan-nebulizer treatments Xopenex. Refill albuterol rescue inhaler

## 2014-12-31 NOTE — Assessment & Plan Note (Signed)
This is not a bad season for him but he is comfortable with his allergy vaccine

## 2014-12-31 NOTE — Progress Notes (Signed)
Subjective:    Patient ID: [name], male    DOB: 10-05-1931, 81 y.o.   MRN: [id number]  HPI 10/20/10- 81 yoM never smoker allergic rhinitis, mild asthma, hx sinusitis, complicated by hx prostate ca and hyperlipidemia Last here December 07, 2009- note reviewed Reports 1 month of increased nasal congestion, post nasal drip. Denies sore throat fever. Ears feel stopped up, hearing unchanged. Not using Nasonex, but favors saline nasal spray.  Feels congestion in upper chest, cough productive white mucus. Denies wheeze and has no inhaler.  Several tick bites in recent weeks. His PCP Dr Henrene Pastor did test for tick borne illness and gave doxycycline- just finished- labs negative.  Continues allergy vaccine at 1:10 and claritin   09/23/11- 81 yoM never smoker allergic rhinitis, mild asthma, hx sinusitis, complicated by hx prostate ca and hyperlipidemia  c/o sob, sinus congestion at night, and chest tightness Increased head and chest congestion for the past month blamed on pollen. Dr. Henrene Pastor, primary physician, it nebulizer and prednisone taper 2 weeks ago with temporary improvement. Out of Nasonex. Has a new dog.  03/29/12- 09/23/11- 81 yoM never smoker allergic rhinitis, mild asthma, hx sinusitis, complicated by hx prostate ca and hyperlipidemia Follows for: Increased SOB for 10 days - Has 2 days left of pred taper from primary md - -Finished zpak - Wheezing - Chest tightness Finishing prednisone taper and Z-Pak. Can't clear his chest. Cough is productive of yellow sputum with some increase in baseline dyspnea with exertion. No pain or fever.   06/14/2012 Acute OV  Complains of  asthma flare w/ increased SOB, tightness in chest, some wheezing, dry cough, decreased energy, dizziness x3-4weeks - SOB worse x1 week.  has been treated by PCP w/ cheratussin, biaxin 500mg , mucinex dm. PCP gave sample of BReo and Albuterol inhaler.  Was unable to clarithromycin due to stomach pains and nausea.  CXR today shows  Pneumonia in the right middle lobe. No significant wt loss. Denies any hemoptysis, orthopnea, PND, or leg swelling.  06/25/12- 81 yoM never smoker allergic rhinitis, mild asthma, hx sinusitis, complicated by hx prostate ca and hyperlipidemia FOLLOWS FOR: saw TP on 06-14-12 and feeling better; ? CXR needed today. S/p RML bacterial pneumonia 06/14/12- failed to respond to biaxin from PCP because patient stopped early with GI upset. Here changed to Rocephin inj, avelox and Zpak. Now much improved with no F, sweat. Only mild dry cough. Uses rescue inhaler occ if needed, not daily.  CXR 06/15/12 IMPRESSION:  Pneumonia in the right middle lobe.  Original Report Authenticated By: Lorriane Shire, M.D.  12/31/12- 81 yoM never smoker allergic rhinitis, mild asthma, hx sinusitis, complicated by hx prostate ca and hyperlipidemia FOLLOWS FOR: still on allergy vaccine and no reactions. Recent flare up-chest congestion. Not able to cough anything up-feels tight. Considers this "seasonal" exacerbation. Continues allergy vaccine 1:10 GO CXR 06/25/12 IMPRESSION:  Hyperinflation again noted. There is residual streaky infiltrate  in the right middle lobe consistent with improving pneumonia.  Follow-up to complete resolution is recommended. No new infiltrate  is noted.  Original Report Authenticated By: Lahoma Crocker, M.D.  07/03/13  81 yoM never smoker allergic rhinitis, mild asthma, hx sinusitis, complicated by hx prostate ca and hyperlipidemia FOLLOWS FOR: has been using Mucinex OTC and Claritin as well as allergy vaccine 1:10 GO. Currently having sinus pressure and ear pressure/clogged x 1 week now. Pt states he had a fall-hit his head and went to ER 4 weeks ago..  12/31/13- 81 yoM never  smoker allergic rhinitis, mild asthma, hx sinusitis, complicated by hx prostate ca and hyperlipidemia FOLLOWS FOR: Continues to get Allergy injections 1:10 at his PCP office. Pt states he has had to use his Flonase rx more in the past 30  days; continues his Claritin as well.  07/02/14- 81 yoM never smoker allergic rhinitis, mild asthma, hx sinusitis, complicated by hx prostate ca and hyperlipidemia Chief Complaint  Patient presents with  . Follow-up    asthma and allergies; no problems w/asthma; allergies are good until enviromental allergies flare up; no SOB; seldom cough  Allergy vaccine 1:10 GO/ by RN Blames Spring pollen for persistent postnasal drip- discussed.  Has Flonase/ claritin- to start using again. Denies cough, wheeze or need for rescue inhaler.   12/31/14- 81 yoM never smoker allergic rhinitis, mild asthma, hx sinusitis, complicated by hx prostate ca and hyperlipidemia, hypothyroid FOLLOWS FOR: Pt states he has had an increase in work of breathing d/t the heat and humidity, chest congestion and chest tightness. Pt denies f/c/s and cough.   Pt c/o mild nasal stuffiness and sinus congestion. Pt still taking allergy injection 1:10 at PCP office and is doing well with it.  Reports increased sense of chest congestion and shortness of breath without phlegm or chest pain after working outdoors and hot weather yesterday. Still feels tight and would like a nebulizer treatment here. Has been out of rescue inhaler   ROS-see HPI Constitutional:   No-   weight loss, night sweats, fevers, chills, fatigue, lassitude. HEENT:   No-  headaches, difficulty swallowing, tooth/dental problems, sore throat,       No-  sneezing, itching, ear ache, +nasal congestion, +post nasal drip,  CV:  No-   chest pain, orthopnea, PND, swelling in lower extremities, anasarca, dizziness, palpitations Resp: + shortness of breath with exertion or at rest.              No-   productive cough,  + non-productive cough,  No- coughing up of blood.              No-   change in color of mucus.  No- wheezing.   Skin: No-   rash or lesions. GI:  No-   heartburn, indigestion, abdominal pain, nausea, vomiting,  GU:  MS:  No-   joint pain or swelling.    Neuro-     nothing unusual Psych:  No- change in mood or affect. No depression or anxiety.  No memory loss.  OBJ- Physical Exam General- Alert, Oriented, Affect-appropriate, Distress- none acute. Thin. Skin- rash-none, lesions- none, excoriation- none Lymphadenopathy- none Head- atraumatic            Eyes- Gross vision intact, PERRLA, conjunctivae and secretions clear            Ears- L cerumen, R perforated drum- known hx            Nose- Clear, no-Septal dev, mucus, polyps, erosion, perforation             Throat- Mallampati II , mucosa clear , drainage-none seen, tonsils- atrophic Neck- flexible , trachea midline, no stridor , thyroid nl, carotid no bruit Chest - symmetrical excursion , unlabored           Heart/CV- RRR , no murmur , no gallop  , no rub, nl s1 s2                           - JVD-  none , edema- none, stasis changes- none, varices- none           Lung- +distant, wheeze- none, cough-none , dullness-none, rub- none           Chest wall-  Abd-  Br/ Gen/ Rectal- Not done, not indicated Extrem- cyanosis- none, clubbing, none, atrophy- none, strength- nl Neuro- grossly intact to observation

## 2014-12-31 NOTE — Patient Instructions (Addendum)
Script sent refilling your albuterol HFA rescue inhaler to use if needed  Neb xop 0.63  Prevnar 13 pneumonia vaccine  Please call as needed

## 2015-01-02 ENCOUNTER — Encounter: Payer: Self-pay | Admitting: Internal Medicine

## 2015-01-05 DIAGNOSIS — M179 Osteoarthritis of knee, unspecified: Secondary | ICD-10-CM | POA: Diagnosis not present

## 2015-01-05 DIAGNOSIS — Z681 Body mass index (BMI) 19 or less, adult: Secondary | ICD-10-CM | POA: Diagnosis not present

## 2015-01-06 ENCOUNTER — Telehealth: Payer: Self-pay | Admitting: Internal Medicine

## 2015-01-06 MED ORDER — ALBUTEROL SULFATE 108 (90 BASE) MCG/ACT IN AEPB: 1.0000 | INHALATION_SPRAY | Freq: Four times a day (QID) | RESPIRATORY_TRACT | Status: DC | PRN

## 2015-01-06 NOTE — Telephone Encounter (Signed)
I have sample Proair Respiclick, # 1, 2 puffs every 4-6 hours, in my office

## 2015-01-06 NOTE — Telephone Encounter (Signed)
Contacted patients pharmacy-they do not have any prescription coverage cards on file for patient is why the cost is so high. I called patient and he stated he will not have pharmacy benefits for any meds until 02-2015. Pt would like to know if [facility] has any samples to give him (albuterol) for the time being. Thanks.

## 2015-01-06 NOTE — Telephone Encounter (Signed)
No- mis-understood. I can call his insurance or ask his pharmacist which albuterol rescue inhaler he can get most cheaply. We will be happy to prescribe that one. There are no generics. We could get him a nebulizer machine with albuterol through a home care company. Medicare will usually pay for this.

## 2015-01-06 NOTE — Telephone Encounter (Signed)
lmtcb for pt.  Respiclick sample left up front for pick up.

## 2015-01-06 NOTE — Telephone Encounter (Signed)
Called spoke w/ pt. He reports his albuterol inhaler is over $60 and can't afford this. He reports he was told by Dr. Annamaria Boots he may can prescribe something cheaper.  Please advise Dr. Annamaria Boots thanks

## 2015-01-07 NOTE — Telephone Encounter (Signed)
Pt called again, told him that samples of med were ready for him to pick up.Hillery Hunter

## 2015-01-07 NOTE — Telephone Encounter (Signed)
lmtcb for pt.  

## 2015-01-07 NOTE — Telephone Encounter (Signed)
[phone], pt cb

## 2015-01-19 DIAGNOSIS — Z681 Body mass index (BMI) 19 or less, adult: Secondary | ICD-10-CM | POA: Diagnosis not present

## 2015-01-19 DIAGNOSIS — W57XXXA Bitten or stung by nonvenomous insect and other nonvenomous arthropods, initial encounter: Secondary | ICD-10-CM | POA: Diagnosis not present

## 2015-01-19 DIAGNOSIS — L03312 Cellulitis of back [any part except buttock]: Secondary | ICD-10-CM | POA: Diagnosis not present

## 2015-02-05 DIAGNOSIS — D2261 Melanocytic nevi of right upper limb, including shoulder: Secondary | ICD-10-CM | POA: Diagnosis not present

## 2015-02-05 DIAGNOSIS — Z85828 Personal history of other malignant neoplasm of skin: Secondary | ICD-10-CM | POA: Diagnosis not present

## 2015-02-05 DIAGNOSIS — D224 Melanocytic nevi of scalp and neck: Secondary | ICD-10-CM | POA: Diagnosis not present

## 2015-02-05 DIAGNOSIS — D692 Other nonthrombocytopenic purpura: Secondary | ICD-10-CM | POA: Diagnosis not present

## 2015-02-05 DIAGNOSIS — D1801 Hemangioma of skin and subcutaneous tissue: Secondary | ICD-10-CM | POA: Diagnosis not present

## 2015-02-18 DIAGNOSIS — C61 Malignant neoplasm of prostate: Secondary | ICD-10-CM | POA: Diagnosis not present

## 2015-02-18 DIAGNOSIS — I10 Essential (primary) hypertension: Secondary | ICD-10-CM | POA: Diagnosis not present

## 2015-02-18 DIAGNOSIS — M810 Age-related osteoporosis without current pathological fracture: Secondary | ICD-10-CM | POA: Diagnosis not present

## 2015-02-18 DIAGNOSIS — Z681 Body mass index (BMI) 19 or less, adult: Secondary | ICD-10-CM | POA: Diagnosis not present

## 2015-02-18 DIAGNOSIS — E785 Hyperlipidemia, unspecified: Secondary | ICD-10-CM | POA: Diagnosis not present

## 2015-02-18 DIAGNOSIS — E039 Hypothyroidism, unspecified: Secondary | ICD-10-CM | POA: Diagnosis not present

## 2015-02-27 ENCOUNTER — Telehealth: Payer: Self-pay | Admitting: Internal Medicine

## 2015-02-27 MED ORDER — PREDNISONE 10 MG PO TABS: ORAL_TABLET | ORAL | Status: DC

## 2015-02-27 NOTE — Telephone Encounter (Signed)
Pt having SOB.  Coughing.  Not wheezing. Not difficult, but if he gets to moving too fast, it causes an asthmatic attack.  Using nasal spray, claritin, taking allergy shots.  Pt says that sometimes he needs prednisone to get him straightened out.   Pharmacy: [facility]  Current Outpatient Prescriptions on File Prior to Visit  Medication Sig Dispense Refill  . albuterol (PROVENTIL HFA;VENTOLIN HFA) 108 (90 BASE) MCG/ACT inhaler Inhale 2 puffs into the lungs every 6 (six) hours as needed for wheezing or shortness of breath. 1 Inhaler prn  . Albuterol Sulfate (PROAIR RESPICLICK) 832 (90 BASE) MCG/ACT AEPB Inhale 1-2 puffs into the lungs every 6 (six) hours as needed. 1 each 0  . calcium carbonate (OS-CAL) 600 MG TABS tablet Take 600 mg by mouth daily with breakfast.    . fluticasone (FLONASE) 50 MCG/ACT nasal spray 2 sprays each nostril every night at bedtime while needed 16 g prn  . levothyroxine (SYNTHROID, LEVOTHROID) 137 MCG tablet Take 137 mcg by mouth daily before breakfast.    . loratadine (CLARITIN) 10 MG tablet Take 10 mg by mouth daily.      . NON FORMULARY Allergies vaccines 1:10 weekly GO    . UNABLE TO FIND Med Name: Allergy injection once a week     No current facility-administered medications on file prior to visit.   Allergies  Allergen Reactions  . Clarithromycin     REACTION: GI upset

## 2015-02-27 NOTE — Telephone Encounter (Signed)
Called and spoke to pt's wife. Informed her of the recs per [facility]. Rx sent to preferred pharmacy. Pt verbalized understanding and denied any further questions or concerns at this time.

## 2015-02-27 NOTE — Telephone Encounter (Signed)
Offer prednisone 10 mg, # 20, 4 X 2 DAYS, 3 X 2 DAYS, 2 X 2 DAYS, 1 X 2 DAYS  

## 2015-03-09 NOTE — Telephone Encounter (Signed)
Pt did not come and pick up this sample.

## 2015-03-12 ENCOUNTER — Telehealth: Payer: Self-pay | Admitting: Internal Medicine

## 2015-03-12 NOTE — Telephone Encounter (Signed)
Called pt.'s spouse and verifyed address will mail vac. To them. Nothing further at this time.

## 2015-03-12 NOTE — Telephone Encounter (Signed)
I called spoke with spouse. She reports pt needs his allergy vaccine mailed to them. Please advise Tammy thanks

## 2015-03-13 ENCOUNTER — Telehealth: Payer: Self-pay | Admitting: Internal Medicine

## 2015-03-13 NOTE — Telephone Encounter (Signed)
Allergy Serum Extract Date Mixed: 03/13/15 Vial: 2 Strength: 1:10 Here/Mail/Pick Up: mail Mixed By: Laurette Schimke

## 2015-03-16 ENCOUNTER — Ambulatory Visit (INDEPENDENT_AMBULATORY_CARE_PROVIDER_SITE_OTHER): Payer: Medicare Other

## 2015-03-16 DIAGNOSIS — J309 Allergic rhinitis, unspecified: Secondary | ICD-10-CM

## 2015-03-27 DIAGNOSIS — Z681 Body mass index (BMI) 19 or less, adult: Secondary | ICD-10-CM | POA: Diagnosis not present

## 2015-03-27 DIAGNOSIS — J441 Chronic obstructive pulmonary disease with (acute) exacerbation: Secondary | ICD-10-CM | POA: Diagnosis not present

## 2015-05-07 DIAGNOSIS — Z85828 Personal history of other malignant neoplasm of skin: Secondary | ICD-10-CM | POA: Diagnosis not present

## 2015-05-07 DIAGNOSIS — C44319 Basal cell carcinoma of skin of other parts of face: Secondary | ICD-10-CM | POA: Diagnosis not present

## 2015-06-17 DIAGNOSIS — J441 Chronic obstructive pulmonary disease with (acute) exacerbation: Secondary | ICD-10-CM | POA: Diagnosis not present

## 2015-06-17 DIAGNOSIS — Z681 Body mass index (BMI) 19 or less, adult: Secondary | ICD-10-CM | POA: Diagnosis not present

## 2015-07-06 ENCOUNTER — Encounter: Payer: Self-pay | Admitting: Internal Medicine

## 2015-07-06 ENCOUNTER — Ambulatory Visit (INDEPENDENT_AMBULATORY_CARE_PROVIDER_SITE_OTHER): Payer: Medicare Other | Admitting: Internal Medicine

## 2015-07-06 VITALS — BP 124/78 | HR 70 | Ht 72.0 in | Wt 123.8 lb

## 2015-07-06 DIAGNOSIS — J302 Other seasonal allergic rhinitis: Secondary | ICD-10-CM

## 2015-07-06 DIAGNOSIS — J4520 Mild intermittent asthma, uncomplicated: Secondary | ICD-10-CM

## 2015-07-06 DIAGNOSIS — J309 Allergic rhinitis, unspecified: Secondary | ICD-10-CM | POA: Diagnosis not present

## 2015-07-06 DIAGNOSIS — J3089 Other allergic rhinitis: Secondary | ICD-10-CM

## 2015-07-06 MED ORDER — METHYLPREDNISOLONE ACETATE 80 MG/ML IJ SUSP
80.0000 mg | Freq: Once | INTRAMUSCULAR | Status: AC
  Administered 2015-07-06: 80 mg via INTRAMUSCULAR

## 2015-07-06 MED ORDER — LEVALBUTEROL HCL 0.63 MG/3ML IN NEBU
0.6300 mg | INHALATION_SOLUTION | Freq: Once | RESPIRATORY_TRACT | Status: AC
  Administered 2015-07-06: 0.63 mg via RESPIRATORY_TRACT

## 2015-07-06 NOTE — Progress Notes (Signed)
Subjective:    Patient ID: [name], male    DOB: 10-05-1931, 82 y.o.   MRN: [id number]  HPI 10/20/10- 79 yoM never smoker allergic rhinitis, mild asthma, hx sinusitis, complicated by hx prostate ca and hyperlipidemia Last here December 07, 2009- note reviewed Reports 1 month of increased nasal congestion, post nasal drip. Denies sore throat fever. Ears feel stopped up, hearing unchanged. Not using Nasonex, but favors saline nasal spray.  Feels congestion in upper chest, cough productive white mucus. Denies wheeze and has no inhaler.  Several tick bites in recent weeks. His PCP Dr Henrene Pastor did test for tick borne illness and gave doxycycline- just finished- labs negative.  Continues allergy vaccine at 1:10 and claritin   09/23/11- 79 yoM never smoker allergic rhinitis, mild asthma, hx sinusitis, complicated by hx prostate ca and hyperlipidemia  c/o sob, sinus congestion at night, and chest tightness Increased head and chest congestion for the past month blamed on pollen. Dr. Henrene Pastor, primary physician, it nebulizer and prednisone taper 2 weeks ago with temporary improvement. Out of Nasonex. Has a new dog.  03/29/12- 09/23/11- 79 yoM never smoker allergic rhinitis, mild asthma, hx sinusitis, complicated by hx prostate ca and hyperlipidemia Follows for: Increased SOB for 10 days - Has 2 days left of pred taper from primary md - -Finished zpak - Wheezing - Chest tightness Finishing prednisone taper and Z-Pak. Can't clear his chest. Cough is productive of yellow sputum with some increase in baseline dyspnea with exertion. No pain or fever.   06/14/2012 Acute OV  Complains of  asthma flare w/ increased SOB, tightness in chest, some wheezing, dry cough, decreased energy, dizziness x3-4weeks - SOB worse x1 week.  has been treated by PCP w/ cheratussin, biaxin 500mg , mucinex dm. PCP gave sample of BReo and Albuterol inhaler.  Was unable to clarithromycin due to stomach pains and nausea.  CXR today shows  Pneumonia in the right middle lobe. No significant wt loss. Denies any hemoptysis, orthopnea, PND, or leg swelling.  06/25/12- 80 yoM never smoker allergic rhinitis, mild asthma, hx sinusitis, complicated by hx prostate ca and hyperlipidemia FOLLOWS FOR: saw TP on 06-14-12 and feeling better; ? CXR needed today. S/p RML bacterial pneumonia 06/14/12- failed to respond to biaxin from PCP because patient stopped early with GI upset. Here changed to Rocephin inj, avelox and Zpak. Now much improved with no F, sweat. Only mild dry cough. Uses rescue inhaler occ if needed, not daily.  CXR 06/15/12 IMPRESSION:  Pneumonia in the right middle lobe.  Original Report Authenticated By: Lorriane Shire, M.D.  12/31/12- 81 yoM never smoker allergic rhinitis, mild asthma, hx sinusitis, complicated by hx prostate ca and hyperlipidemia FOLLOWS FOR: still on allergy vaccine and no reactions. Recent flare up-chest congestion. Not able to cough anything up-feels tight. Considers this "seasonal" exacerbation. Continues allergy vaccine 1:10 GO CXR 06/25/12 IMPRESSION:  Hyperinflation again noted. There is residual streaky infiltrate  in the right middle lobe consistent with improving pneumonia.  Follow-up to complete resolution is recommended. No new infiltrate  is noted.  Original Report Authenticated By: Lahoma Crocker, M.D.  07/03/13  89 yoM never smoker allergic rhinitis, mild asthma, hx sinusitis, complicated by hx prostate ca and hyperlipidemia FOLLOWS FOR: has been using Mucinex OTC and Claritin as well as allergy vaccine 1:10 GO. Currently having sinus pressure and ear pressure/clogged x 1 week now. Pt states he had a fall-hit his head and went to ER 4 weeks ago..  12/31/13- 32 yoM never  smoker allergic rhinitis, mild asthma, hx sinusitis, complicated by hx prostate ca and hyperlipidemia FOLLOWS FOR: Continues to get Allergy injections 1:10 at his PCP office. Pt states he has had to use his Flonase rx more in the past 30  days; continues his Claritin as well.  07/02/14- 88 yoM never smoker allergic rhinitis, mild asthma, hx sinusitis, complicated by hx prostate ca and hyperlipidemia Chief Complaint  Patient presents with  . Follow-up    asthma and allergies; no problems w/asthma; allergies are good until enviromental allergies flare up; no SOB; seldom cough  Allergy vaccine 1:10 GO/ by RN Blames Spring pollen for persistent postnasal drip- discussed.  Has Flonase/ claritin- to start using again. Denies cough, wheeze or need for rescue inhaler.   12/31/14- [name] never smoker allergic rhinitis, mild asthma, hx sinusitis, complicated by hx prostate ca and hyperlipidemia, hypothyroid FOLLOWS FOR: Pt states he has had an increase in work of breathing d/t the heat and humidity, chest congestion and chest tightness. Pt denies f/c/s and cough.   Pt c/o mild nasal stuffiness and sinus congestion. Pt still taking allergy injection 1:10 at PCP office and is doing well with it.  Reports increased sense of chest congestion and shortness of breath without phlegm or chest pain after working outdoors and hot weather yesterday. Still feels tight and would like a nebulizer treatment here. Has been out of rescue inhaler  07/06/2015-82 year old male never smoker followed for allergic rhinitis, mild asthma, history sinusitis, complicated by history prostate cancer, hyperlipidemia, hypothyroid Allergy vaccine 1:10 G0 by RN FOLLOWS FOR: Pt states he can tell me the pollen is starting to come around-continues to wear mask when working in the yard. Pt still on allergy vaccine weekly at Dr Blanch Media office. Using rescue inhaler sometimes once a day, just in the last month for occasional shortness of breath and tightness if he spends much time outside. Nose okay.  ROS-see HPI Constitutional:   No-   weight loss, night sweats, fevers, chills, fatigue, lassitude. HEENT:   No-  headaches, difficulty swallowing, tooth/dental problems, sore  throat,       No-  sneezing, itching, ear ache, +nasal congestion, +post nasal drip,  CV:  No-   chest pain, orthopnea, PND, swelling in lower extremities, anasarca, dizziness, palpitations Resp: + shortness of breath with exertion or at rest.              No-   productive cough,  + non-productive cough,  No- coughing up of blood.              No-   change in color of mucus.  No- wheezing.   Skin: No-   rash or lesions. GI:  No-   heartburn, indigestion, abdominal pain, nausea, vomiting,  GU:  MS:  No-   joint pain or swelling.   Neuro-     nothing unusual Psych:  No- change in mood or affect. No depression or anxiety.  No memory loss.  OBJ- Physical Exam General- Alert, Oriented, Affect-appropriate, Distress- none acute. + Thin. Skin- rash-none, lesions- none, excoriation- none Lymphadenopathy- none Head- atraumatic            Eyes- Gross vision intact, PERRLA, conjunctivae and secretions clear            Ears- L cerumen, R perforated drum- known hx            Nose- Clear, no-Septal dev, mucus, polyps, erosion, perforation  Throat- Mallampati II , mucosa clear , drainage-none seen, tonsils- atrophic Neck- flexible , trachea midline, no stridor , thyroid nl, carotid no bruit Chest - symmetrical excursion , unlabored           Heart/CV- RRR , no murmur , no gallop  , no rub, nl s1 s2                           - JVD- none , edema- none, stasis changes- none, varices- none           Lung- +distant, wheeze- none, + light , dullness-none, rub- none           Chest wall-  Abd-  Br/ Gen/ Rectal- Not done, not indicated Extrem- cyanosis- none, clubbing, none, atrophy- none, strength- nl Neuro- grossly intact to observation

## 2015-07-06 NOTE — Patient Instructions (Addendum)
Neb xop 0.63  Depo 80  If you still need to use the rescue inhaler regularly, we will look at trying a maintenance inhaler again.  We will probably stop allergy shots at the end of this year and see how you do.

## 2015-07-12 NOTE — Assessment & Plan Note (Signed)
Okay to continue allergy vaccine through the rest of this year as discussed.

## 2015-07-12 NOTE — Assessment & Plan Note (Signed)
Mild exacerbation, possibly related to spring pollens. Plan-nebulizer treatments Xopenex, Depo-Medrol. Consider maintenance inhaler.

## 2015-07-30 ENCOUNTER — Telehealth: Payer: Self-pay | Admitting: Internal Medicine

## 2015-07-31 ENCOUNTER — Telehealth: Payer: Self-pay | Admitting: Internal Medicine

## 2015-07-31 DIAGNOSIS — J309 Allergic rhinitis, unspecified: Secondary | ICD-10-CM | POA: Diagnosis not present

## 2015-07-31 NOTE — Telephone Encounter (Signed)
See telephone encounter (07/31/2015)

## 2015-07-31 NOTE — Telephone Encounter (Signed)
Allergy Serum Extract Date Mixed: 07/31/2015 Vial: AB Strength: 1:10 Here/Mail/Pick Up: Mail Mixed By: Desmond Dike, CMA Last OV: 07/06/2015 Pending OV: 07/06/2016

## 2015-08-17 DIAGNOSIS — E039 Hypothyroidism, unspecified: Secondary | ICD-10-CM | POA: Diagnosis not present

## 2015-08-17 DIAGNOSIS — I10 Essential (primary) hypertension: Secondary | ICD-10-CM | POA: Diagnosis not present

## 2015-08-17 DIAGNOSIS — E785 Hyperlipidemia, unspecified: Secondary | ICD-10-CM | POA: Diagnosis not present

## 2015-08-17 DIAGNOSIS — Z681 Body mass index (BMI) 19 or less, adult: Secondary | ICD-10-CM | POA: Diagnosis not present

## 2015-08-17 DIAGNOSIS — M545 Low back pain: Secondary | ICD-10-CM | POA: Diagnosis not present

## 2015-08-17 DIAGNOSIS — M47816 Spondylosis without myelopathy or radiculopathy, lumbar region: Secondary | ICD-10-CM | POA: Diagnosis not present

## 2015-08-17 DIAGNOSIS — M810 Age-related osteoporosis without current pathological fracture: Secondary | ICD-10-CM | POA: Diagnosis not present

## 2015-08-20 DIAGNOSIS — J181 Lobar pneumonia, unspecified organism: Secondary | ICD-10-CM | POA: Diagnosis not present

## 2015-08-20 DIAGNOSIS — Z681 Body mass index (BMI) 19 or less, adult: Secondary | ICD-10-CM | POA: Diagnosis not present

## 2015-08-26 DIAGNOSIS — J181 Lobar pneumonia, unspecified organism: Secondary | ICD-10-CM | POA: Diagnosis not present

## 2015-08-26 DIAGNOSIS — Z681 Body mass index (BMI) 19 or less, adult: Secondary | ICD-10-CM | POA: Diagnosis not present

## 2015-09-02 DIAGNOSIS — Z681 Body mass index (BMI) 19 or less, adult: Secondary | ICD-10-CM | POA: Diagnosis not present

## 2015-09-02 DIAGNOSIS — J181 Lobar pneumonia, unspecified organism: Secondary | ICD-10-CM | POA: Diagnosis not present

## 2015-09-16 DIAGNOSIS — J181 Lobar pneumonia, unspecified organism: Secondary | ICD-10-CM | POA: Diagnosis not present

## 2015-09-16 DIAGNOSIS — J441 Chronic obstructive pulmonary disease with (acute) exacerbation: Secondary | ICD-10-CM | POA: Diagnosis not present

## 2015-09-16 DIAGNOSIS — Z681 Body mass index (BMI) 19 or less, adult: Secondary | ICD-10-CM | POA: Diagnosis not present

## 2015-09-23 DIAGNOSIS — M9903 Segmental and somatic dysfunction of lumbar region: Secondary | ICD-10-CM | POA: Diagnosis not present

## 2015-09-23 DIAGNOSIS — M5136 Other intervertebral disc degeneration, lumbar region: Secondary | ICD-10-CM | POA: Diagnosis not present

## 2015-10-09 DIAGNOSIS — A938 Other specified arthropod-borne viral fevers: Secondary | ICD-10-CM | POA: Diagnosis not present

## 2015-10-09 DIAGNOSIS — W57XXXA Bitten or stung by nonvenomous insect and other nonvenomous arthropods, initial encounter: Secondary | ICD-10-CM | POA: Diagnosis not present

## 2015-10-09 DIAGNOSIS — Z681 Body mass index (BMI) 19 or less, adult: Secondary | ICD-10-CM | POA: Diagnosis not present

## 2015-10-14 DIAGNOSIS — Z85828 Personal history of other malignant neoplasm of skin: Secondary | ICD-10-CM | POA: Diagnosis not present

## 2015-10-14 DIAGNOSIS — L111 Transient acantholytic dermatosis [Grover]: Secondary | ICD-10-CM | POA: Diagnosis not present

## 2015-12-14 DIAGNOSIS — M9903 Segmental and somatic dysfunction of lumbar region: Secondary | ICD-10-CM | POA: Diagnosis not present

## 2015-12-14 DIAGNOSIS — M5136 Other intervertebral disc degeneration, lumbar region: Secondary | ICD-10-CM | POA: Diagnosis not present

## 2016-01-12 ENCOUNTER — Other Ambulatory Visit: Payer: Self-pay

## 2016-01-14 DIAGNOSIS — J019 Acute sinusitis, unspecified: Secondary | ICD-10-CM | POA: Diagnosis not present

## 2016-01-14 DIAGNOSIS — J441 Chronic obstructive pulmonary disease with (acute) exacerbation: Secondary | ICD-10-CM | POA: Diagnosis not present

## 2016-02-15 DIAGNOSIS — R0602 Shortness of breath: Secondary | ICD-10-CM | POA: Diagnosis not present

## 2016-02-15 DIAGNOSIS — E039 Hypothyroidism, unspecified: Secondary | ICD-10-CM | POA: Diagnosis not present

## 2016-02-15 DIAGNOSIS — E785 Hyperlipidemia, unspecified: Secondary | ICD-10-CM | POA: Diagnosis not present

## 2016-02-15 DIAGNOSIS — I10 Essential (primary) hypertension: Secondary | ICD-10-CM | POA: Diagnosis not present

## 2016-02-18 ENCOUNTER — Telehealth: Payer: Self-pay | Admitting: Internal Medicine

## 2016-02-18 NOTE — Telephone Encounter (Signed)
Patients wife states that patient is feeling dizzy and having trouble with SOB. Coughing up yellow mucus, chest tightness. Wheezing.  Wants to see Dr. Annamaria Boots.  Dr. Annamaria Boots does not have any openings this week, so patient was scheduled to see TP tomorrow at 1045  Nothing further needed.

## 2016-02-19 ENCOUNTER — Ambulatory Visit (INDEPENDENT_AMBULATORY_CARE_PROVIDER_SITE_OTHER)
Admission: RE | Admit: 2016-02-19 | Discharge: 2016-02-19 | Disposition: A | Discharge status: Home / Self Care | Payer: Medicare Other | Source: Ambulatory Visit | Attending: Adult Health | Admitting: Adult Health

## 2016-02-19 ENCOUNTER — Telehealth: Payer: Self-pay | Admitting: Adult Health

## 2016-02-19 ENCOUNTER — Encounter: Payer: Self-pay | Admitting: Adult Health

## 2016-02-19 ENCOUNTER — Ambulatory Visit (INDEPENDENT_AMBULATORY_CARE_PROVIDER_SITE_OTHER): Payer: Medicare Other | Admitting: Adult Health

## 2016-02-19 VITALS — BP 148/80 | HR 77 | Temp 97.6°F | Ht 72.0 in | Wt 131.0 lb

## 2016-02-19 DIAGNOSIS — R0602 Shortness of breath: Secondary | ICD-10-CM

## 2016-02-19 DIAGNOSIS — J4520 Mild intermittent asthma, uncomplicated: Secondary | ICD-10-CM | POA: Diagnosis not present

## 2016-02-19 DIAGNOSIS — R05 Cough: Secondary | ICD-10-CM | POA: Diagnosis not present

## 2016-02-19 DIAGNOSIS — R42 Dizziness and giddiness: Secondary | ICD-10-CM | POA: Diagnosis not present

## 2016-02-19 HISTORY — DX: Dizziness and giddiness: R42

## 2016-02-19 MED ORDER — MECLIZINE HCL 25 MG PO TABS
ORAL_TABLET | ORAL | 0 refills | Status: DC
Start: 2016-02-19 — End: 2016-10-26

## 2016-02-19 NOTE — Assessment & Plan Note (Signed)
Dizziness ? Etiology . Exam is unrevealing.  This may be a case of vertigo with associated AR flare  Have suggested tx w/ AR control and meclizine  Advised him and his daughter that is not improving need further evaluation with PCP or ER if worse /not resolving

## 2016-02-19 NOTE — Assessment & Plan Note (Addendum)
Resolving flare with AR  ? Dizziness is vertigo  CXR w/ nad  Plan  Patient Instructions  Push fluids .  Change positions slowly .  Use Meclizine 25mg  1/2 to 1 every 8hr as needed.  Use Duoneb every 6hrs as needed Continue on Claritin 10mg  daily .  Continue on Flonase 2 puffs every day .  follow up Dr. Annamaria Boots  In 3 months and As needed   Please contact office for sooner follow up if symptoms do not improve or worsen or seek emergency care  If dizziness if not improving will need further evaluation , or if worse go to ER .

## 2016-02-19 NOTE — Progress Notes (Signed)
Subjective:    Patient ID: James Iha Sr., male    DOB: [birth year], 82 y.o.   MRN: [id number]  HPI  82 yo male never smoker followed for AR, mild asthma, chronic sinusitis    02/19/2016 Acute OV : Asthma  Pt presents for an acute office visit. Complains over last week he has not felt good.  Complains of dizziness/lightheadness, feels like head is full, also has some chest congestion and tightness. Min cough . No fever , chest pain, orthopnea, edema , syncope or palpitations. No visual/speech changes or arm weakness. Feels ears are stopped up . Was seen by PCP on 10/9 , given steroid taper . He says he has had minimal improvement  Says PCP thought his oxygen level was low but when he was walked in his office , oxygen level was normal and went up with walking . Today in office O2 sats 100% on RA on arrival.  Pt is worried he has PNA, in past he felt like this when he had PNA. CXR today shows clear lung w/ no sign of PNA .  He feels lightheadeness is worse with moving head around .  Took meclizine last night with some improvement  Says PCP called this week and was told thyroid is off and increased his synthroid.  Was started on Inhaler by PCP for asthma , unsure of name.     Past Medical History:  Diagnosis Date  . Allergic rhinitis   . History of prostate cancer    seed implant  . Hyperlipemia   . Mild asthma   . Rhinosinusitis    Current Outpatient Prescriptions on File Prior to Visit  Medication Sig Dispense Refill  . albuterol (PROVENTIL HFA;VENTOLIN HFA) 108 (90 BASE) MCG/ACT inhaler Inhale 2 puffs into the lungs every 6 (six) hours as needed for wheezing or shortness of breath. 1 Inhaler prn  . loratadine (CLARITIN) 10 MG tablet Take 10 mg by mouth daily.      . NON FORMULARY Allergies vaccines 1:10 weekly GO    . calcium carbonate (OS-CAL) 600 MG TABS tablet Take 600 mg by mouth daily with breakfast.    . fluticasone (FLONASE) 50 MCG/ACT nasal spray 2 sprays each  nostril every night at bedtime while needed (Patient not taking: Reported on 02/19/2016) 16 g prn  . SYNTHROID 175 MCG tablet Take 1 tablet by mouth daily.    Marland Kitchen UNABLE TO FIND Med Name: Allergy injection once a week     No current facility-administered medications on file prior to visit.        Review of Systems Constitutional:   No  weight loss, night sweats,  Fevers, chills,  +fatigue, or  lassitude.  HEENT:   No headaches,  Difficulty swallowing,  Tooth/dental problems, or  Sore throat,                No sneezing, itching, ear ache,  +nasal congestion, post nasal drip,   CV:  No chest pain,  Orthopnea, PND, swelling in lower extremities, anasarca, dizziness, palpitations, syncope.   GI  No heartburn, indigestion, abdominal pain, nausea, vomiting, diarrhea, change in bowel habits, loss of appetite, bloody stools.   Resp:  .  No chest wall deformity  Skin: no rash or lesions.  GU: no dysuria, change in color of urine, no urgency or frequency.  No flank pain, no hematuria   MS:  No joint pain or swelling.  No decreased range of motion.  No back pain.  Psych:  No change in mood or affect. No depression or anxiety.  No memory loss.         Objective:   Physical Exam Vitals:   02/19/16 1059  BP: (!) 148/80  Pulse: 77  Temp: 97.6 F (36.4 C)  TempSrc: Oral  SpO2: 100%  Weight: 131 lb (59.4 kg)  Height: 6' (1.829 m)   GEN: A/Ox3; pleasant , NAD, thin    HEENT:  [place]/AT,  EACs-clear, TMs-wnl, NOSE-clear drainage , THROAT-clear, no lesions, no postnasal drip or exudate noted.   NECK:  Supple w/ fair ROM; no JVD; normal carotid impulses w/o bruits; no thyromegaly or nodules palpated; no lymphadenopathy.    RESP  Decreased BS in bases . no accessory muscle use, no dullness to percussion  CARD:  RRR, no m/r/g  , no peripheral edema, pulses intact, no cyanosis or clubbing.  GI:   Soft & nt; nml bowel sounds; no organomegaly or masses detected.   Musco: Warm bil, no  deformities or joint swelling noted.   Neuro: alert, no focal deficits noted.  CN 2-12 intact, nml grips and gait. Neg pronator drift.    Skin: Warm, no lesions or rashes  Hamdi Vari NP-C  [facility]  02/19/2016

## 2016-02-19 NOTE — Telephone Encounter (Signed)
Spoke with Mrs. Crisp that patient's dizziness is better and aware that Ruthe Mannan is fine to use 2 pfsBID and rinse mouth.

## 2016-02-19 NOTE — Patient Instructions (Addendum)
Push fluids .  Change positions slowly .  Use Meclizine 25mg  1/2 to 1 every 8hr as needed.  Use Duoneb every 6hrs as needed Continue on Claritin 10mg  daily .  Continue on Flonase 2 puffs every day .  follow up Dr. Annamaria Boots  In 3 months and As needed   Please contact office for sooner follow up if symptoms do not improve or worsen or seek emergency care  If dizziness if not improving will need further evaluation , or if worse go to ER .

## 2016-02-19 NOTE — Telephone Encounter (Signed)
Spoke with pt's wife Hoyle Sauer, states that pt was given Dulera 200 samples by another doctor on Monday.  Pt's wife is calling to see if he still needs to be using dulera, and if so how.  Also requesting clarification on albuterol.  TP please advise.  Thanks!

## 2016-02-19 NOTE — Telephone Encounter (Signed)
That is fine to take [facility] 2 puffs Twice daily  , rinse after use.  How is his dizziness?  Please contact office for sooner follow up if symptoms do not improve or worsen or seek emergency care

## 2016-02-22 DIAGNOSIS — J157 Pneumonia due to Mycoplasma pneumoniae: Secondary | ICD-10-CM | POA: Diagnosis not present

## 2016-02-26 DIAGNOSIS — J157 Pneumonia due to Mycoplasma pneumoniae: Secondary | ICD-10-CM | POA: Diagnosis not present

## 2016-03-07 ENCOUNTER — Telehealth: Payer: Self-pay | Admitting: Internal Medicine

## 2016-03-07 NOTE — Telephone Encounter (Signed)
Will send this message to TS to follow up on allergy shot medication.

## 2016-03-07 NOTE — Telephone Encounter (Signed)
Spoke with wife. We did not get the verbal order. I apolgized to her and told her it would be in tomorrow's mail. Closing, nothing further needed.

## 2016-03-09 DIAGNOSIS — T7840XA Allergy, unspecified, initial encounter: Secondary | ICD-10-CM | POA: Diagnosis not present

## 2016-03-09 DIAGNOSIS — J157 Pneumonia due to Mycoplasma pneumoniae: Secondary | ICD-10-CM | POA: Diagnosis not present

## 2016-03-09 DIAGNOSIS — Z23 Encounter for immunization: Secondary | ICD-10-CM | POA: Diagnosis not present

## 2016-03-10 ENCOUNTER — Telehealth: Payer: Self-pay | Admitting: Internal Medicine

## 2016-03-10 DIAGNOSIS — J309 Allergic rhinitis, unspecified: Secondary | ICD-10-CM | POA: Diagnosis not present

## 2016-03-10 NOTE — Telephone Encounter (Signed)
Allergy Serum Extract Date Mixed: 03/10/16 Vial: 2 Strength: 1:10 Here/Mail/Pick Up: mail Mixed By: tbs Last OV: 07/06/15 Pending OV: 07/06/16

## 2016-03-18 DIAGNOSIS — E039 Hypothyroidism, unspecified: Secondary | ICD-10-CM | POA: Diagnosis not present

## 2016-04-20 DIAGNOSIS — D1801 Hemangioma of skin and subcutaneous tissue: Secondary | ICD-10-CM | POA: Diagnosis not present

## 2016-04-20 DIAGNOSIS — Z85828 Personal history of other malignant neoplasm of skin: Secondary | ICD-10-CM | POA: Diagnosis not present

## 2016-04-20 DIAGNOSIS — L570 Actinic keratosis: Secondary | ICD-10-CM | POA: Diagnosis not present

## 2016-04-20 DIAGNOSIS — D692 Other nonthrombocytopenic purpura: Secondary | ICD-10-CM | POA: Diagnosis not present

## 2016-04-20 DIAGNOSIS — D1723 Benign lipomatous neoplasm of skin and subcutaneous tissue of right leg: Secondary | ICD-10-CM | POA: Diagnosis not present

## 2016-04-20 DIAGNOSIS — D2261 Melanocytic nevi of right upper limb, including shoulder: Secondary | ICD-10-CM | POA: Diagnosis not present

## 2016-04-20 DIAGNOSIS — L821 Other seborrheic keratosis: Secondary | ICD-10-CM | POA: Diagnosis not present

## 2016-04-20 DIAGNOSIS — D224 Melanocytic nevi of scalp and neck: Secondary | ICD-10-CM | POA: Diagnosis not present

## 2016-05-10 DIAGNOSIS — J449 Chronic obstructive pulmonary disease, unspecified: Secondary | ICD-10-CM | POA: Diagnosis not present

## 2016-07-06 ENCOUNTER — Ambulatory Visit (INDEPENDENT_AMBULATORY_CARE_PROVIDER_SITE_OTHER): Payer: Medicare HMO | Admitting: Internal Medicine

## 2016-07-06 ENCOUNTER — Encounter: Payer: Self-pay | Admitting: Internal Medicine

## 2016-07-06 DIAGNOSIS — J3089 Other allergic rhinitis: Secondary | ICD-10-CM | POA: Diagnosis not present

## 2016-07-06 DIAGNOSIS — J302 Other seasonal allergic rhinitis: Secondary | ICD-10-CM

## 2016-07-06 DIAGNOSIS — J4520 Mild intermittent asthma, uncomplicated: Secondary | ICD-10-CM

## 2016-07-06 NOTE — Assessment & Plan Note (Signed)
He is satisfied to continue current management. At baseline today.

## 2016-07-06 NOTE — Assessment & Plan Note (Signed)
At his age allergic rhinitis is less likely than vasomotor/atrophic rhinitis. I suggested he run out of vaccine and stopped allergy shots which are being given at his primary office. He should be able to control symptoms adequately with an OTC antihistamine and nasal steroid spray if needed. If he really has problems I suggested she establish with an allergist close to his home, in [place].

## 2016-07-06 NOTE — Patient Instructions (Signed)
Ok to run out of your allergy vaccine supply and stop allergy shots. You may not need them any more. Try using the albuterol inhaler or your nebulizer machine, and claritin if needed.  If you have a lot of allergy problems, you can see the allergists in , which would be close for you.   Please call as needed

## 2016-07-06 NOTE — Progress Notes (Signed)
Subjective:    Patient ID: [name], male    DOB: Feb 17, 1932, 83 y.o.   MRN: [id number]  HPI  male never smoker followed for allergic rhinitis, mild asthma, history sinusitis, complicated by history prostate cancer, hyperlipidemia, hypothyroid  -------------------------------------------------------------------------------------------  07/06/2015-83 year old male never smoker followed for allergic rhinitis, mild asthma, history sinusitis, complicated by history prostate cancer, hyperlipidemia, hypothyroid Allergy vaccine 1:10 G0 by RN FOLLOWS FOR: Pt states he can tell me the pollen is starting to come around-continues to wear mask when working in the yard. Pt still on allergy vaccine weekly at Dr Blanch Media office. Using rescue inhaler sometimes once a day, just in the last month for occasional shortness of breath and tightness if he spends much time outside. Nose okay.  07/06/2016- 83 year old male never smoker followed for allergic rhinitis, mild asthma, history sinusitis, complicated by history prostate cancer, hyperlipidemia, hypothyroid Follows For: asthma, mild intermittent, pt reports he is doing well He considers that he is doing very well for his age. He resolved acute bronchitis in the fall with no subsequent significant exacerbation. Persistent mild sense of chest congestion with little routine sputum or wheeze. Using nebulizer machine/DuoNeb about one time a week. Still using up the last of his allergy vaccine with injections from his primary office. I recommended he stop shots when this is completed and watch then to see if he manages comfortably with antihistamines before assuming he needs to see a new allergist. CXR 02/19/2016 IMPRESSION: 1. Hyperexpanded lungs suggesting COPD. 2. No acute findings.  No evidence of pneumonia.  ROS-see HPI Constitutional:   No-   weight loss, night sweats, fevers, chills, fatigue, lassitude. HEENT:   No-  headaches, difficulty swallowing,  tooth/dental problems, sore throat,       No-  sneezing, itching, ear ache, +nasal congestion, +post nasal drip,  CV:  No-   chest pain, orthopnea, PND, swelling in lower extremities, anasarca, dizziness, palpitations Resp: + shortness of breath with exertion or at rest.              No-   productive cough,  + non-productive cough,  No- coughing up of blood.              No-   change in color of mucus.  No- wheezing.   Skin: No-   rash or lesions. GI:  No-   heartburn, indigestion, abdominal pain, nausea, vomiting,  GU:  MS:  No-   joint pain or swelling.   Neuro-     nothing unusual Psych:  No- change in mood or affect. No depression or anxiety.  No memory loss.  OBJ- Physical Exam General- Alert, Oriented, Affect-appropriate, Distress- none acute. + Thin, talkative Skin- rash-none, lesions- none, excoriation- none Lymphadenopathy- none Head- atraumatic            Eyes- Gross vision intact, PERRLA, conjunctivae and secretions clear            Ears- + R perforated drum- known hx            Nose- Clear, no-Septal dev, mucus, polyps, erosion, perforation             Throat- Mallampati II , mucosa clear , drainage-none seen, tonsils- atrophic Neck- flexible , trachea midline, no stridor , thyroid nl, carotid no bruit Chest - symmetrical excursion , unlabored           Heart/CV- RRR , no murmur , no gallop  , no rub, nl s1 s2                           -  JVD- none , edema- none, stasis changes- none, varices- none           Lung- +distant/unlabored, wheeze- none, cough-none , dullness-none, rub- none           Chest wall-  Abd-  Br/ Gen/ Rectal- Not done, not indicated Extrem- cyanosis- none, clubbing, none, atrophy- none, strength- nl Neuro- grossly intact to observation

## 2016-08-05 DIAGNOSIS — J4540 Moderate persistent asthma, uncomplicated: Secondary | ICD-10-CM | POA: Diagnosis not present

## 2016-08-05 DIAGNOSIS — R0602 Shortness of breath: Secondary | ICD-10-CM | POA: Diagnosis not present

## 2016-08-18 DIAGNOSIS — J411 Mucopurulent chronic bronchitis: Secondary | ICD-10-CM | POA: Diagnosis not present

## 2016-08-22 ENCOUNTER — Telehealth: Payer: Self-pay | Admitting: Internal Medicine

## 2016-08-22 ENCOUNTER — Encounter: Payer: Self-pay | Admitting: Internal Medicine

## 2016-08-22 ENCOUNTER — Ambulatory Visit (INDEPENDENT_AMBULATORY_CARE_PROVIDER_SITE_OTHER): Payer: Medicare HMO | Admitting: Internal Medicine

## 2016-08-22 ENCOUNTER — Ambulatory Visit: Payer: PRIVATE HEALTH INSURANCE | Admitting: Internal Medicine

## 2016-08-22 VITALS — BP 116/70 | HR 77 | Ht 72.0 in | Wt 129.0 lb

## 2016-08-22 DIAGNOSIS — J302 Other seasonal allergic rhinitis: Secondary | ICD-10-CM | POA: Diagnosis not present

## 2016-08-22 DIAGNOSIS — J4520 Mild intermittent asthma, uncomplicated: Secondary | ICD-10-CM | POA: Diagnosis not present

## 2016-08-22 DIAGNOSIS — J3089 Other allergic rhinitis: Secondary | ICD-10-CM | POA: Diagnosis not present

## 2016-08-22 LAB — NITRIC OXIDE: Nitric Oxide: 19

## 2016-08-22 MED ORDER — ALBUTEROL SULFATE HFA 108 (90 BASE) MCG/ACT IN AERS: 2.0000 | INHALATION_SPRAY | Freq: Four times a day (QID) | RESPIRATORY_TRACT | 99 refills | Status: DC | PRN

## 2016-08-22 MED ORDER — METHYLPREDNISOLONE ACETATE 80 MG/ML IJ SUSP
80.0000 mg | Freq: Once | INTRAMUSCULAR | Status: AC
  Administered 2016-08-22: 80 mg via INTRAMUSCULAR

## 2016-08-22 NOTE — Assessment & Plan Note (Signed)
Best to consider this mostly rhinitis and mild tracheitis with little lower respiratory involvement. He has had a mild intermittent uncomplicated asthma over the years. At his age, nonspecific irritant triggers are more important than allergy. We reviewed medications and can make decisions about refills. Watch for possibility of thrush with Symbicort. He may benefit from a spacer or change to a LABA/LAMA product like Bevespi. He believes that a depo shot will fix him. Plan- depo 80

## 2016-08-22 NOTE — Progress Notes (Signed)
Subjective:    Patient ID: [name], male    DOB: [birth year], 83 y.o.   MRN: [id number]  HPI  male never smoker followed for allergic rhinitis, mild intermittent asthma, history sinusitis, complicated by history prostate cancer, hyperlipidemia, hypothyroid FENO 08/22/16- 19- WNL Office Spirometry 08/22/16- WNL- FVC 3.65/ 85%, FEV1 2.70-/ 89%, ratio 0.74, FEF 25-75% 2.03/ 98% -------------------------------------------------------------------------------------------  07/06/2015-83 year old male never smoker followed for allergic rhinitis, mild asthma, history sinusitis, complicated by history prostate cancer, hyperlipidemia, hypothyroid Allergy vaccine 1:10 G0 by RN FOLLOWS FOR: Pt states he can tell me the pollen is starting to come around-continues to wear mask when working in the yard. Pt still on allergy vaccine weekly at Dr Blanch Media office. Using rescue inhaler sometimes once a day, just in the last month for occasional shortness of breath and tightness if he spends much time outside. Nose okay.  07/06/2016- 83 year old male never smoker followed for allergic rhinitis, mild asthma, history sinusitis, complicated by history prostate cancer, hyperlipidemia, hypothyroid Follows For: asthma, mild intermittent, pt reports he is doing well He considers that he is doing very well for his age. He resolved acute bronchitis in the fall with no subsequent significant exacerbation. Persistent mild sense of chest congestion with little routine sputum or wheeze. Using nebulizer machine/DuoNeb about one time a week. Still using up the last of his allergy vaccine with injections from his primary office. I recommended he stop shots when this is completed and watch then to see if he manages comfortably with antihistamines before assuming he needs to see a new allergist. CXR 02/19/2016 IMPRESSION: 1. Hyperexpanded lungs suggesting COPD. 2. No acute findings.  No evidence of pneumonia.  08/22/2016-  83 year old male never smoker followed for allergic rhinitis, mild asthma, history sinusitis, complicated by history prostate cancer, hyperlipidemia, hypothyroid ACUTE VISIT: Seen by PCP last week-was dx'd chronic bronchitis; pt is having increased cough and SOB. Fullness in chest and hoarseness as well. Ears stopped up. Pt not getting any better. Was given a sample of Symbicort. Has nebulizer machine with ipratropium/albuterol being used twice daily.  Denies fever or purulent sputum,  Using nebulizer with DuoNeb about one time daily, Symbicort 2 puffs twice daily. No antibiotic.  ROS-see HPI Constitutional:   No-   weight loss, night sweats, fevers, chills, fatigue, lassitude. HEENT:   No-  headaches, difficulty swallowing, tooth/dental problems, sore throat,       No-  sneezing, itching, ear ache, +nasal congestion, +post nasal drip,  CV:  No-   chest pain, orthopnea, PND, swelling in lower extremities, anasarca, dizziness, palpitations Resp: + shortness of breath with exertion or at rest.              No-   productive cough,  + non-productive cough,  No- coughing up of blood.              No-   change in color of mucus.  No- wheezing.   Skin: No-   rash or lesions. GI:  No-   heartburn, indigestion, abdominal pain, nausea, vomiting,  GU:  MS:  No-   joint pain or swelling.   Neuro-     nothing unusual Psych:  No- change in mood or affect. No depression or anxiety.  No memory loss.  OBJ- Physical Exam General- Alert, Oriented, Affect-appropriate, Distress- none acute. + Thin, talkative Skin- rash-none, lesions- none, excoriation- none Lymphadenopathy- none Head- atraumatic            Eyes- Gross vision intact, PERRLA, conjunctivae  and secretions clear            Ears- + R perforated drum- known hx, + HOH            Nose- Clear, no-Septal dev, mucus, polyps, erosion, perforation             Throat- Mallampati II , mucosa + lightly coated, drainage-none seen, tonsils- atrophic, + mild  hoarse Neck- flexible , trachea midline, no stridor , thyroid nl, carotid no bruit Chest - symmetrical excursion , unlabored           Heart/CV- RRR , no murmur , no gallop  , no rub, nl s1 s2                           - JVD- none , edema- none, stasis changes- none, varices- none           Lung- +distant/unlabored, wheeze- none, cough-none , dullness-none, rub- none           Chest wall-  Abd-  Br/ Gen/ Rectal- Not done, not indicated Extrem- cyanosis- none, clubbing, none, atrophy- none, strength- nl Neuro- grossly intact to observation

## 2016-08-22 NOTE — Assessment & Plan Note (Signed)
Mild pollen rhinitis. Cannot exclude a mild upper respiratory infection Plan-Depo-Medrol, antihistamines

## 2016-08-22 NOTE — Telephone Encounter (Signed)
Spoke with pt's daughter [name] (verbal per pt) who states pt was dx with bronchitis by PCP last week. Pt reports of non prod cough, voice hoarseness & increased sob. Pt denies any fever chills or sweats. [name] is requesting apt as pt is not improving. Pt has been scheduled for acute visit with MW at 11:30.  CY please advise if okay to help apt, or if you would like to give recommendations via telephone. Thanks.

## 2016-08-22 NOTE — Patient Instructions (Addendum)
Order-office spirometry-DX asthma mild intermittent  Order-FENO                                 "  Albuterol rescue inhaler refill prescription sent to [facility]  Order- Depo 19  Ok to use up the Symbicort 160 sample    inhale 2 puffs, then rinse mouth well, twice daily  Ok to use either the nebulizer machine or your rescue inhaler every 4-6 hours if needed  Keep appointment or call for earlier help as needed.

## 2016-08-22 NOTE — Telephone Encounter (Signed)
Per CY pt has been scheduled with CY at 4:30 today. [name] is aware and voiced her understanding. Nothing further needed

## 2016-09-05 DIAGNOSIS — M545 Low back pain: Secondary | ICD-10-CM | POA: Diagnosis not present

## 2016-09-05 DIAGNOSIS — E782 Mixed hyperlipidemia: Secondary | ICD-10-CM | POA: Diagnosis not present

## 2016-09-05 DIAGNOSIS — J449 Chronic obstructive pulmonary disease, unspecified: Secondary | ICD-10-CM | POA: Diagnosis not present

## 2016-09-05 DIAGNOSIS — E038 Other specified hypothyroidism: Secondary | ICD-10-CM | POA: Diagnosis not present

## 2016-09-05 DIAGNOSIS — I10 Essential (primary) hypertension: Secondary | ICD-10-CM | POA: Diagnosis not present

## 2016-09-05 DIAGNOSIS — M150 Primary generalized (osteo)arthritis: Secondary | ICD-10-CM | POA: Diagnosis not present

## 2016-09-27 ENCOUNTER — Telehealth: Payer: Self-pay | Admitting: Internal Medicine

## 2016-09-27 NOTE — Telephone Encounter (Signed)
ATC pt x 3. Line busy. WCB.  

## 2016-09-28 MED ORDER — PREDNISONE 10 MG PO TABS: ORAL_TABLET | ORAL | 0 refills | Status: DC

## 2016-09-28 NOTE — Telephone Encounter (Signed)
Patient daughter called back for appt per Joellen Jersey pt scheduled for 09/29/16 @ 11:30am pt arriving at 11:15am. Pt daughter Wellington [place] wanting to know if there are any suggestions to get pt relief until then - she thinks he has bronchitis same symptoms as the last time he was diagnosis - trouble breathing, hoarseness, fatigue- possible increased nebulizer or inhaler usage to help him? She can be reached at [phone] -pr

## 2016-09-28 NOTE — Telephone Encounter (Signed)
Katie- We are no longer providing allergy vaccine therapy at this office. He was given suggestions about establishing with another allergy office if he wishes to continue this therapy. If he has done this, then he may want to be seen by his new allergist now. If he wishes my help with his breathing, then please see if we can work him onto my schedyudle as an overbook somewhere.

## 2016-09-28 NOTE — Telephone Encounter (Signed)
Called and spoke with pts wife and she stated that for the last week the pt has been having some SOB with activity  And has to use his rescue inhaler when up moving around.  He is still using the nebulizers.  She stated that he is tired all the time.  He was trying to take the allergy shots every other week, but this did not work out for him and he gets worse if he tries to do this.  CY please advise. Thanks  Allergies  Allergen Reactions  . Clarithromycin     REACTION: GI upset    Last ov--08/22/16 Next ov--07/12/17

## 2016-09-28 NOTE — Telephone Encounter (Signed)
He can use his nebulizer every 4 hours if needed.  We can send in prednisone 10 mg, # 20, 4 X 2 DAYS, 3 X 2 DAYS, 2 X 2 DAYS, 1 X 2 DAYS   To go ahead and get started on this.

## 2016-09-28 NOTE — Telephone Encounter (Signed)
Spoke wit patient daughter [name] - any recommendations to keep comfortable until appt 09/29/16  Pt having trouble breathing, hoarseness, fatigue- possible increased nebulizer or inhaler usage to help him?  Please advise Dr Annamaria Boots. Thanks.

## 2016-09-28 NOTE — Telephone Encounter (Signed)
Spoke with patient and daughter, aware of rec's per Dr Annamaria Boots. [facility]. Nothing further needed.

## 2016-09-29 ENCOUNTER — Encounter: Payer: Self-pay | Admitting: Internal Medicine

## 2016-09-29 ENCOUNTER — Ambulatory Visit (INDEPENDENT_AMBULATORY_CARE_PROVIDER_SITE_OTHER): Payer: Medicare HMO | Admitting: Internal Medicine

## 2016-09-29 VITALS — BP 122/78 | HR 76 | Resp 16 | Ht 72.0 in | Wt 126.8 lb

## 2016-09-29 DIAGNOSIS — J3089 Other allergic rhinitis: Secondary | ICD-10-CM

## 2016-09-29 DIAGNOSIS — J302 Other seasonal allergic rhinitis: Secondary | ICD-10-CM | POA: Diagnosis not present

## 2016-09-29 DIAGNOSIS — J309 Allergic rhinitis, unspecified: Secondary | ICD-10-CM | POA: Diagnosis not present

## 2016-09-29 DIAGNOSIS — J4520 Mild intermittent asthma, uncomplicated: Secondary | ICD-10-CM

## 2016-09-29 MED ORDER — AMOXICILLIN 500 MG PO TABS: 500.0000 mg | ORAL_TABLET | Freq: Two times a day (BID) | ORAL | 0 refills | Status: DC

## 2016-09-29 MED ORDER — LEVALBUTEROL HCL 0.63 MG/3ML IN NEBU
0.6300 mg | INHALATION_SOLUTION | Freq: Once | RESPIRATORY_TRACT | Status: AC
  Administered 2016-09-29: 0.63 mg via RESPIRATORY_TRACT

## 2016-09-29 MED ORDER — METHYLPREDNISOLONE ACETATE 80 MG/ML IJ SUSP
80.0000 mg | Freq: Once | INTRAMUSCULAR | Status: AC
  Administered 2016-09-29: 80 mg via INTRAMUSCULAR

## 2016-09-29 NOTE — Patient Instructions (Addendum)
Script sent for amoxacillin  Neb xop 0.63      Dx exacerbation COPD         Depo 80   Referral to [facility] in [place]     Dx allergic rhinitis, asthma

## 2016-09-29 NOTE — Progress Notes (Signed)
Subjective:    Patient ID: [name], male    DOB: 21-Jul-1931, 83 y.o.   MRN: [id number]  HPI  male never smoker followed for allergic rhinitis, mild intermittent asthma, history sinusitis, complicated by history prostate cancer, hyperlipidemia, hypothyroid FENO 08/22/16- 19- WNL Office Spirometry 08/22/16- WNL- FVC 3.65/ 85%, FEV1 2.70-/ 89%, ratio 0.74, FEF 25-75% 2.03/ 98% -------------------------------------------------------------------------------------------  07/06/2016- 83 year old male never smoker followed for allergic rhinitis, mild asthma, history sinusitis, complicated by history prostate cancer, hyperlipidemia, hypothyroid Follows For: asthma, mild intermittent, pt reports he is doing well He considers that he is doing very well for his age. He resolved acute bronchitis in the fall with no subsequent significant exacerbation. Persistent mild sense of chest congestion with little routine sputum or wheeze. Using nebulizer machine/DuoNeb about one time a week. Still using up the last of his allergy vaccine with injections from his primary office. I recommended he stop shots when this is completed and watch then to see if he manages comfortably with antihistamines before assuming he needs to see a new allergist. CXR 02/19/2016 IMPRESSION: 1. Hyperexpanded lungs suggesting COPD. 2. No acute findings.  No evidence of pneumonia.  08/22/2016- 83 year old male never smoker followed for allergic rhinitis, mild asthma, history sinusitis, complicated by history prostate cancer, hyperlipidemia, hypothyroid ACUTE VISIT: Seen by PCP last week-was dx'd chronic bronchitis; pt is having increased cough and SOB. Fullness in chest and hoarseness as well. Ears stopped up. Pt not getting any better. Was given a sample of Symbicort. Has nebulizer machine with ipratropium/albuterol being used twice daily.  Denies fever or purulent sputum,  Using nebulizer with DuoNeb about one time daily, Symbicort 2  puffs twice daily. No antibiotic.  09/29/16-83 year old male never smoker followed for allergic rhinitis, mild asthma, history sinusitis, complicated by history prostate cancer, hyperlipidemia, hypothyroid ACUTE  patient is coming in for asthma, patient is very SOB.patient chest is tight and congested  . Comes today with daughter. He describes 5 days of increased shortness of breath with chest congestion, no fever, blaming "pollen". He called yesterday and we have called in a prednisone taper to start. Cough is not productive white/yellow sputum  ROS-see HPI Constitutional:   No-   weight loss, night sweats, fevers, chills, fatigue, lassitude. HEENT:   No-  headaches, difficulty swallowing, tooth/dental problems, sore throat,       No-  sneezing, itching, ear ache, +nasal congestion, +post nasal drip,  CV:  No-   chest pain, orthopnea, PND, swelling in lower extremities, anasarca, dizziness, palpitations Resp: + shortness of breath with exertion or at rest.             +  productive cough,  + non-productive cough,  No- coughing up of blood.        +   change in color of mucus.  No- wheezing.   Skin: No-   rash or lesions. GI:  No-   heartburn, indigestion, abdominal pain, nausea, vomiting,  GU:  MS:  No-   joint pain or swelling.   Neuro-     nothing unusual Psych:  No- change in mood or affect. No depression or anxiety.  No memory loss.  OBJ- Physical Exam General- Alert, Oriented, Affect-appropriate, Distress- none acute. + Thin, talkative Skin- rash-none, lesions- none, excoriation- none Lymphadenopathy- none Head- atraumatic            Eyes- Gross vision intact, PERRLA, conjunctivae and secretions clear            Ears- +  R perforated drum- known hx, + very HOH(did not bring hearing aids)            Nose- Clear, no-Septal dev, mucus, polyps, erosion, perforation             Throat- Mallampati II , mucosa -clear, drainage-none seen, tonsils- atrophic, Neck- flexible , trachea midline,  no stridor , thyroid nl, carotid no bruit Chest - symmetrical excursion , unlabored           Heart/CV- RRR , no murmur , no gallop  , no rub, nl s1 s2                           - JVD- none , edema- none, stasis changes- none, varices- none           Lung- +distant/unlabored, wheeze- none, + , dullness-none, rub- none           Chest wall-  Abd-  Br/ Gen/ Rectal- Not done, not indicated Extrem- cyanosis- none, clubbing, none, atrophy- none, strength- nl Neuro- grossly intact to observation

## 2016-10-02 NOTE — Assessment & Plan Note (Signed)
He is off allergy vaccine now but feels he should continue. We suggested he make an appointment with [facility] in [place] since that would be closest for him. At age 83, I am less convinced allergy vaccine will make a big difference. We have discussed conservative measures.

## 2016-10-02 NOTE — Assessment & Plan Note (Addendum)
Acute exacerbation-viral and/or allergy given the season. Plan-finished prednisone taper. Nebulizer treatments Xopenex and Depo-Medrol which he has confidence in and asks for this visit. Stay well hydrated. Prescription for amoxicillin to hold as discussed.

## 2016-10-04 ENCOUNTER — Telehealth: Payer: Self-pay | Admitting: Internal Medicine

## 2016-10-04 NOTE — Telephone Encounter (Signed)
Spoke with Elmyra Ricks at [facility] [place] a referral faxed over.  Fax # [phone] Advised that this will be faxed.  This has been printed and faxed.  Nothing further needed.

## 2016-10-05 ENCOUNTER — Telehealth: Payer: Self-pay | Admitting: Internal Medicine

## 2016-10-05 NOTE — Telephone Encounter (Signed)
Spoke with pt's daughter and she states she is concerned b/c pt is out of vaccine and can not see [facility] until 6/20 She is wondering if there are any changes that need to be made in the interim  She states he is doing ok so far and no changes since his last visit 5/24 Please advise, thanks!

## 2016-10-06 NOTE — Telephone Encounter (Signed)
Nothing to do differently. Being off allergy vaccine may make him anxious, but I don't expect it to make much short term difference to his breathing, especially since pollen season is about over.

## 2016-10-06 NOTE — Telephone Encounter (Signed)
Spoke with [name] and informed her of CY's message. She understood and had no further questions. Nothing further is needed.

## 2016-10-10 DIAGNOSIS — W57XXXA Bitten or stung by nonvenomous insect and other nonvenomous arthropods, initial encounter: Secondary | ICD-10-CM | POA: Diagnosis not present

## 2016-10-10 DIAGNOSIS — A770 Spotted fever due to Rickettsia rickettsii: Secondary | ICD-10-CM | POA: Diagnosis not present

## 2016-10-24 DIAGNOSIS — W57XXXA Bitten or stung by nonvenomous insect and other nonvenomous arthropods, initial encounter: Secondary | ICD-10-CM | POA: Diagnosis not present

## 2016-10-24 DIAGNOSIS — A770 Spotted fever due to Rickettsia rickettsii: Secondary | ICD-10-CM | POA: Diagnosis not present

## 2016-10-26 ENCOUNTER — Encounter: Payer: Self-pay | Admitting: Allergy and Immunology

## 2016-10-26 ENCOUNTER — Ambulatory Visit (INDEPENDENT_AMBULATORY_CARE_PROVIDER_SITE_OTHER): Payer: Medicare HMO | Admitting: Allergy and Immunology

## 2016-10-26 VITALS — BP 158/84 | HR 76 | Temp 98.1°F | Resp 20 | Ht 69.0 in | Wt 127.0 lb

## 2016-10-26 DIAGNOSIS — J3089 Other allergic rhinitis: Secondary | ICD-10-CM

## 2016-10-26 DIAGNOSIS — J4540 Moderate persistent asthma, uncomplicated: Secondary | ICD-10-CM | POA: Diagnosis not present

## 2016-10-26 NOTE — Progress Notes (Signed)
Dear Dr. Annamaria Boots,  Thank you for referring James Iha Sr. to the [facility] on 10/26/2016.   Below is a summation of this patient's evaluation and recommendations.  Thank you for your referral. I will keep you informed about this patient's response to treatment.   If you have any questions please do not hesitate to contact me.   Sincerely,  Jiles Prows, MD All[facility] / [facility] [place]   ______________________________________________________________________    NEW PATIENT NOTE  Referring Provider: Deneise Lever, MD Primary Provider: [name], MD Date of office visit: 10/26/2016    Subjective:   Chief Complaint:  James Iha Sr. (DOB: [birth year]) is a 83 y.o. male who presents to the clinic on 10/26/2016 with a chief complaint of Allergic Rhinitis  and Asthma .     HPI: [name] presents to this clinic in evaluation of problems with allergies and asthma. He has been a long-standing patient of Dr. Keturah Barre and has received immunotherapy for approximately 8 years which he discontinued this May.  He is firmly convinced that immunotherapy has changed his life and he would like to continue to use this form of treatment.   Apparently he develops these recurrent episodes of shortness of breath and not having enough air and dyspnea when he exerts himself for which he will use a nebulizer just about every day or at least 3 times per week. This appears to be precipitated while mowing the grass and there are many times this occurs without any obvious precipitant. He does wear a mask when he mows the grass. He states that between his family doctor and Dr. Annamaria Boots he will receive systemic steroids at least twice a year and sometimes 3 or 4 times per year for this issue. It does not sound as though he has been hospitalized for this issue and it does not sound as though he  has had to go to the urgent care center for this issue but he does have un-scheduled physician visits to take care of this problem. He receives his systemic steroid mostly around spring and fall.  Past Medical History:  Diagnosis Date  . Allergic rhinitis   . History of prostate cancer    seed implant  . Hyperlipemia   . Mild asthma   . Rhinosinusitis     Past Surgical History:  Procedure Laterality Date  . APPENDECTOMY      Allergies as of 10/26/2016      Reactions   Clarithromycin    REACTION: GI upset      Medication List      albuterol 108 (90 Base) MCG/ACT inhaler Commonly known as:  PROVENTIL HFA;VENTOLIN HFA Inhale 2 puffs into the lungs every 6 (six) hours as needed for wheezing or shortness of breath.   budesonide-formoterol 160-4.5 MCG/ACT inhaler Commonly known as:  SYMBICORT Inhale 2 puffs into the lungs 2 (two) times daily.   calcium carbonate 600 MG Tabs tablet Commonly known as:  OS-CAL Take 600 mg by mouth daily with breakfast.   doxycycline 100 MG tablet Commonly known as:  VIBRA-TABS Take 100 mg by mouth 2 (two) times daily.   fluticasone 50 MCG/ACT nasal spray Commonly known as:  FLONASE 2 sprays each nostril every night at bedtime while needed   ipratropium-albuterol 0.5-2.5 (3) MG/3ML Soln Commonly known as:  DUONEB Take 3 mLs by nebulization every 4 (four) hours as  needed.   loratadine 10 MG tablet Commonly known as:  CLARITIN Take 10 mg by mouth daily.   NON FORMULARY Allergies vaccines 1:10 weekly GO   SYNTHROID 175 MCG tablet Generic drug:  levothyroxine Take 1 tablet by mouth daily.       Review of systems negative except as noted in HPI / PMHx or noted below:  Review of Systems  Constitutional: Negative.   HENT: Negative.   Eyes: Negative.   Respiratory: Negative.   Cardiovascular: Negative.   Gastrointestinal: Negative.   Genitourinary: Negative.   Musculoskeletal: Negative.   Skin: Negative.   Neurological:  Negative.   Endo/Heme/Allergies: Negative.   Psychiatric/Behavioral: Negative.     Family History  Problem Relation Age of Onset  . Alzheimer's disease Father   . Diabetes Mother     Social History   Social History  . Marital status: Married    Spouse name: N/A  . Number of children: N/A  . Years of education: N/A   Occupational History  . Retired Building surveyor   Social History Main Topics  . Smoking status: Never Smoker  . Smokeless tobacco: Never Used  . Alcohol use No  . Drug use: No  . Sexual activity: Not on file   Other Topics Concern  . Not on file   Social History Narrative  . No narrative on file    Environmental and Social history  Lives in a house with a dry environment, a dog and cat located inside the household, carpeting in the bedroom, plastic on the bed but not the pillow, and no smoking ongoing with inside the household.  Objective:   Vitals:   10/26/16 1401  BP: (!) 158/84  Pulse: 76  Resp: 20  Temp: 98.1 F (36.7 C)   Height: 5\' 9"  (175.3 cm) Weight: 127 lb (57.6 kg)  Physical Exam  Constitutional: He is well-developed, well-nourished, and in no distress.  Nasal crease  HENT:  Head: Normocephalic. Head is without right periorbital erythema and without left periorbital erythema.  Right Ear: Tympanic membrane, external ear and ear canal normal.  Left Ear: Tympanic membrane, external ear and ear canal normal.  Nose: Nose normal. No mucosal edema or rhinorrhea.  Mouth/Throat: Oropharynx is clear and moist and mucous membranes are normal. No oropharyngeal exudate.  Eyes: Conjunctivae and lids are normal. Pupils are equal, round, and reactive to light.  Neck: Trachea normal. No tracheal deviation present. No thyromegaly present.  Cardiovascular: Normal rate, regular rhythm, S1 normal, S2 normal and normal heart sounds.   No murmur heard. Pulmonary/Chest: Effort normal. No stridor. No tachypnea. No respiratory distress.  He has no wheezes. He has no rales. He exhibits no tenderness.  Abdominal: Soft. He exhibits no distension and no mass. There is no hepatosplenomegaly. There is no tenderness. There is no rebound and no guarding.  Musculoskeletal: He exhibits no edema or tenderness.  Lymphadenopathy:       Head (right side): No tonsillar adenopathy present.       Head (left side): No tonsillar adenopathy present.    He has no cervical adenopathy.    He has no axillary adenopathy.  Neurological: He is alert. Gait normal.  Skin: No rash noted. He is not diaphoretic. No erythema. No pallor. Nails show no clubbing.  Psychiatric: Mood and affect normal.    Diagnostics: Allergy skin tests were performed. He demonstrated hypersensitivity to grasses, cat, and dog  Spirometry was performed and demonstrated an FEV1 of 2.44 @  89 % of predicted. Following the administration of nebulized albuterol his FEV1 rose to 2.69 which was an increase in the FEV1 of 10%.  Assessment and Plan:    1. Asthma, moderate persistent, well-controlled   2. Other allergic rhinitis     1. Allergen avoidance measures  2. Every day utilize the following to prevent inflammation:   A. Symbicort 160 - 2 inhalations twice a day with spacer  B. OTC Nasacort or Flonase - one spray each nostril one time per day  3. If needed:   A. Proventil HFA 2 puffs every 4-6 hours  B. DuoNeb every 4-6 hours  C. Claritin / loratadine 10 mg one tablet one time per day  4. Immunotherapy?  5. Return to clinic in 4 weeks or earlier if problem  Upton appears to have atopic immune system with inflammation of his respiratory tract and I have given him a plan to minimize allergen avoidance exposure and use preventative medications for both his nose and chest and we will see what type of response we get with this approach over the course of the next month and make a determination about further evaluation and treatment based upon this response.  Jiles Prows, MD [facility]

## 2016-10-26 NOTE — Patient Instructions (Addendum)
  1. Allergen avoidance measures  2. Every day utilize the following to prevent inflammation:   A. Symbicort 160 - 2 inhalations twice a day with spacer  B. OTC Nasacort or Flonase - one spray each nostril one time per day  3. If needed:   A. Proventil HFA 2 puffs every 4-6 hours  B. DuoNeb every 4-6 hours  C. Claritin / loratadine 10 mg one tablet one time per day  4. Immunotherapy?  5. Return to clinic in 4 weeks or earlier if problem

## 2016-11-23 ENCOUNTER — Ambulatory Visit (INDEPENDENT_AMBULATORY_CARE_PROVIDER_SITE_OTHER): Payer: Medicare HMO | Admitting: Allergy and Immunology

## 2016-11-23 ENCOUNTER — Encounter: Payer: Self-pay | Admitting: Allergy and Immunology

## 2016-11-23 ENCOUNTER — Ambulatory Visit: Payer: Medicare Other | Admitting: Allergy and Immunology

## 2016-11-23 VITALS — BP 120/70 | HR 68 | Resp 16

## 2016-11-23 DIAGNOSIS — J3089 Other allergic rhinitis: Secondary | ICD-10-CM | POA: Diagnosis not present

## 2016-11-23 DIAGNOSIS — J4540 Moderate persistent asthma, uncomplicated: Secondary | ICD-10-CM | POA: Diagnosis not present

## 2016-11-23 MED ORDER — BUDESONIDE-FORMOTEROL FUMARATE 160-4.5 MCG/ACT IN AERO: INHALATION_SPRAY | RESPIRATORY_TRACT | 5 refills | Status: DC

## 2016-11-23 NOTE — Patient Instructions (Addendum)
  1. Continue to perform Allergen avoidance measures  2. Every day utilize the following to prevent inflammation:   A. Symbicort 160 - 2 inhalations twice a day with spacer  B. OTC Nasacort - one spray each nostril one time per day  3. If needed:   A. Proventil HFA 2 puffs every 4-6 hours  B. DuoNeb every 4-6 hours  C. Claritin / loratadine 10 mg one tablet one time per day  4. Return to clinic in November 2018 or earlier if problem  5. Obtain fall flu vaccine

## 2016-11-23 NOTE — Progress Notes (Signed)
Follow-up Note  Referring Provider: [name],* Primary Provider: [name], MD Date of Office Visit: 11/23/2016  Subjective:   James Iha Sr. (DOB: 1931/07/19) is a 83 y.o. male who returns to the [facility] on 11/23/2016 in re-evaluation of the following:  HPI: [name] returns to this clinic in reevaluation of his asthma and allergic rhinoconjunctivitis addressed during his initial evaluation of 10/26/2016.  He feels wonderful. His shortness of breath has resolved. He does not use a bronchodilator at this point in time. He's had for little problems with his nose. He does not need to use a short acting bronchodilator.  Allergies as of 11/23/2016      Reactions   Clarithromycin    REACTION: GI upset      Medication List      albuterol 108 (90 Base) MCG/ACT inhaler Commonly known as:  PROVENTIL HFA;VENTOLIN HFA Inhale 2 puffs into the lungs every 6 (six) hours as needed for wheezing or shortness of breath.   budesonide-formoterol 160-4.5 MCG/ACT inhaler Commonly known as:  SYMBICORT Inhale two puffs using spacer twice daily to prevent cough or wheeze.  Rinse, gargle, and spit after use.   calcium carbonate 600 MG Tabs tablet Commonly known as:  OS-CAL Take 600 mg by mouth daily with breakfast.   ipratropium-albuterol 0.5-2.5 (3) MG/3ML Soln Commonly known as:  DUONEB Take 3 mLs by nebulization every 4 (four) hours as needed.   loratadine 10 MG tablet Commonly known as:  CLARITIN Take 10 mg by mouth daily.   MULTIVITAMIN ADULT PO Take by mouth.   NASACORT ALLERGY 24HR 55 MCG/ACT Aero nasal inhaler Generic drug:  triamcinolone Place 1 spray into the nose daily.   SYNTHROID 175 MCG tablet Generic drug:  levothyroxine Take 1 tablet by mouth daily.       Past Medical History:  Diagnosis Date  . Allergic rhinitis   . History of prostate cancer    seed implant  . Hyperlipemia   . Mild asthma   .  Rhinosinusitis     Past Surgical History:  Procedure Laterality Date  . APPENDECTOMY      Review of systems negative except as noted in HPI / PMHx or noted below:  Review of Systems  Constitutional: Negative.   HENT: Negative.   Eyes: Negative.   Respiratory: Negative.   Cardiovascular: Negative.   Gastrointestinal: Negative.   Genitourinary: Negative.   Musculoskeletal: Negative.   Skin: Negative.   Neurological: Negative.   Endo/Heme/Allergies: Negative.   Psychiatric/Behavioral: Negative.      Objective:   Vitals:   11/23/16 1543  BP: 120/70  Pulse: 68  Resp: 16          Physical Exam  Constitutional: He is well-developed, well-nourished, and in no distress.  HENT:  Head: Normocephalic.  Right Ear: External ear normal.  Left Ear: External ear normal.  Nose: Nose normal. No mucosal edema or rhinorrhea.  Mouth/Throat: Uvula is midline, oropharynx is clear and moist and mucous membranes are normal. No oropharyngeal exudate.  Bilateral hearing aids  Eyes: Conjunctivae are normal.  Neck: Trachea normal. No tracheal tenderness present. No tracheal deviation present. No thyromegaly present.  Cardiovascular: Normal rate, regular rhythm, S1 normal, S2 normal and normal heart sounds.   No murmur heard. Pulmonary/Chest: Breath sounds normal. No stridor. No respiratory distress. He has no wheezes. He has no rales.  Musculoskeletal: He exhibits no edema.  Lymphadenopathy:  Head (right side): No tonsillar adenopathy present.       Head (left side): No tonsillar adenopathy present.    He has no cervical adenopathy.  Neurological: He is alert. Gait normal.  Skin: No rash noted. He is not diaphoretic. No erythema. Nails show no clubbing.  Psychiatric: Mood and affect normal.    Diagnostics:    Spirometry was performed and demonstrated an FEV1 of 2.56 at 93 % of predicted.  The patient had an Asthma Control Test with the following results: ACT Total Score: 17.      Assessment and Plan:   1. Asthma, moderate persistent, well-controlled   2. Other allergic rhinitis     1. Continue to perform Allergen avoidance measures  2. Every day utilize the following to prevent inflammation:   A. Symbicort 160 - 2 inhalations twice a day with spacer  B. OTC Nasacort - one spray each nostril one time per day  3. If needed:   A. Proventil HFA 2 puffs every 4-6 hours  B. DuoNeb every 4-6 hours  C. Claritin / loratadine 10 mg one tablet one time per day  4. Return to clinic in November 2018 or earlier if problem  5. Obtain fall flu vaccine  While Wm is consistently using Symbicort and Nasacort he has really done very well regarding both his upper and lower airway symptoms and I will continue him on these medications assuming that he will do well as he goes through each season of the year. I will regroup with him in November 2018 or earlier if there is a problem.  Allena Katz, MD Allergy / Immunology [place]

## 2017-01-05 DIAGNOSIS — H8113 Benign paroxysmal vertigo, bilateral: Secondary | ICD-10-CM | POA: Diagnosis not present

## 2017-01-19 ENCOUNTER — Telehealth: Payer: Self-pay | Admitting: Internal Medicine

## 2017-01-19 MED ORDER — PREDNISONE 20 MG PO TABS: 20.0000 mg | ORAL_TABLET | Freq: Every day | ORAL | 0 refills | Status: DC

## 2017-01-19 MED ORDER — AMOXICILLIN 500 MG PO CAPS: 500.0000 mg | ORAL_CAPSULE | Freq: Two times a day (BID) | ORAL | 0 refills | Status: DC

## 2017-01-19 NOTE — Telephone Encounter (Signed)
Pt's daughter, [name] is aware that Rx has been sent to preferred pharmacy. Nothing further needed.

## 2017-01-19 NOTE — Telephone Encounter (Signed)
Anticipating possible storm problems, suggest we offer, to have on hand :  Amoxacillin 500 m, # 14, 1 twice daily  Prednisone 20 mg, # 4, 1 daily

## 2017-01-19 NOTE — Telephone Encounter (Signed)
Pt c/o increased SOB, chest congestion, fatigue, pnd.  Notes that pt had recently been treated for an ear infection but was given drops, not oral abx.  Pt's daughter wonders if this is residual from that.  S/s present Xapprox 2 weeks.    Denies fever, prod cough, chills, chest pain.    Taking mucinex, maintenance meds to help with s/s.  Requesting further recs.    [facility] [place]    Cy please advise on further recs.  Thanks.

## 2017-01-19 NOTE — Telephone Encounter (Signed)
lmtcb X1 for pt's daughter.  rx's sent to preferred pharmacy.

## 2017-01-19 NOTE — Telephone Encounter (Signed)
Pt's daughter returning call. [phone]

## 2017-02-01 ENCOUNTER — Encounter: Payer: Self-pay | Admitting: Internal Medicine

## 2017-02-01 ENCOUNTER — Ambulatory Visit (INDEPENDENT_AMBULATORY_CARE_PROVIDER_SITE_OTHER): Payer: Medicare HMO | Admitting: Internal Medicine

## 2017-02-01 VITALS — BP 126/78 | HR 71 | Ht 69.0 in | Wt 127.0 lb

## 2017-02-01 DIAGNOSIS — J302 Other seasonal allergic rhinitis: Secondary | ICD-10-CM | POA: Diagnosis not present

## 2017-02-01 DIAGNOSIS — Z23 Encounter for immunization: Secondary | ICD-10-CM

## 2017-02-01 DIAGNOSIS — J4521 Mild intermittent asthma with (acute) exacerbation: Secondary | ICD-10-CM

## 2017-02-01 DIAGNOSIS — J4520 Mild intermittent asthma, uncomplicated: Secondary | ICD-10-CM

## 2017-02-01 DIAGNOSIS — J3089 Other allergic rhinitis: Secondary | ICD-10-CM

## 2017-02-01 MED ORDER — METHYLPREDNISOLONE ACETATE 80 MG/ML IJ SUSP
80.0000 mg | Freq: Once | INTRAMUSCULAR | Status: AC
  Administered 2017-02-01: 80 mg via INTRAMUSCULAR

## 2017-02-01 MED ORDER — FLUTICASONE-UMECLIDIN-VILANT 100-62.5-25 MCG/INH IN AEPB: 1.0000 | INHALATION_SPRAY | Freq: Every day | RESPIRATORY_TRACT | 12 refills | Status: DC

## 2017-02-01 NOTE — Progress Notes (Signed)
Subjective:    Patient ID: [name], male    DOB: March 17, 1932, 83 y.o.   MRN: [id number]  HPI  male never smoker followed for allergic rhinitis, mild intermittent asthma, history sinusitis, complicated by history prostate cancer, hyperlipidemia, hypothyroid FENO 08/22/16- 19- WNL Office Spirometry 08/22/16- WNL- FVC 3.65/ 85%, FEV1 2.70-/ 89%, ratio 0.74, FEF 25-75% 2.03/ 98% -------------------------------------------------------------------------------------------  08/22/2016- 83 year old male never smoker followed for allergic rhinitis, mild asthma, history sinusitis, complicated by history prostate cancer, hyperlipidemia, hypothyroid ACUTE VISIT: Seen by PCP last week-was dx'd chronic bronchitis; pt is having increased cough and SOB. Fullness in chest and hoarseness as well. Ears stopped up. Pt not getting any better. Was given a sample of Symbicort. Has nebulizer machine with ipratropium/albuterol being used twice daily.  Denies fever or purulent sputum,  Using nebulizer with DuoNeb about one time daily, Symbicort 2 puffs twice daily. No antibiotic.  09/29/16-83 year old male never smoker followed for allergic rhinitis, mild asthma, history sinusitis, complicated by history prostate cancer, hyperlipidemia, hypothyroid ACUTE  patient is coming in for asthma, patient is very SOB.patient chest is tight and congested  . Comes today with daughter. He describes 5 days of increased shortness of breath with chest congestion, no fever, blaming "pollen". He called yesterday and we have called in a prednisone taper to start. Cough is not productive white/yellow sputum  02/01/17- 83 year old male never smoker followed for allergic rhinitis, mild asthma, history sinusitis, complicated by history prostate cancer, hyperlipidemia, hypothyroid                   Daughter here FOLLOWS FOR: Pt c/o slight increase in SOB, light-headed, ear stuffiness x 2 weeks but states this has imporved since completing  hte abx and pred that CY sent to pharmacy on 01/19/2017. Pt denies cough, chest congestion, CP/tightness, f/c/s.  He has been working with Dr. Neldon Mc but so far has not started allergy shots. He reports skin testing was positive for dog and cat (he has both ), and grass. Complains of full pressure feeling in his ears, somewhat increased short of breath and feels he can't take a deep comfortable breath. Not actually coughing or wheezing.  This may have begun with recent seasonal weather changes.  ROS-see HPI  + = positive Constitutional:   No-   weight loss, night sweats, fevers, chills, fatigue, lassitude. HEENT:   No-  headaches, difficulty swallowing, tooth/dental problems, sore throat,       No-  sneezing, itching, ear ache, +nasal congestion, +post nasal drip,  CV:  No-   chest pain, orthopnea, PND, swelling in lower extremities, anasarca, dizziness, palpitations Resp: + shortness of breath with exertion or at rest.             +  productive cough,  + non-productive cough,  No- coughing up of blood.        +   change in color of mucus.  No- wheezing.   Skin: No-   rash or lesions. GI:  No-   heartburn, indigestion, abdominal pain, nausea, vomiting,  GU:  MS:  No-   joint pain or swelling.   Neuro-     nothing unusual Psych:  No- change in mood or affect. No depression or anxiety.  No memory loss.  OBJ- Physical Exam General- Alert, Oriented, Affect-appropriate, Distress- none acute. + Thin, talkative Skin- rash-none, lesions- none, excoriation- none Lymphadenopathy- none Head- atraumatic            Eyes- Gross vision intact, PERRLA,  conjunctivae and secretions clear            Ears- + R perforated drum- known hx, + very HOH            Nose- Clear, no-Septal dev, mucus, polyps, erosion, perforation             Throat- Mallampati II , mucosa -clear, drainage-none seen, tonsils- atrophic, Neck- flexible , trachea midline, no stridor , thyroid nl, carotid no bruit Chest - symmetrical  excursion , unlabored           Heart/CV- RRR , no murmur , no gallop  , no rub, nl s1 s2                           - JVD- none , edema- none, stasis changes- none, varices- none           Lung- +distant/unlabored, wheeze- none, + , dullness-none, rub- none           Chest wall-  Abd-  Br/ Gen/ Rectal- Not done, not indicated Extrem- cyanosis- none, clubbing, none, atrophy- none, strength- nl Neuro- grossly intact to observation

## 2017-02-01 NOTE — Patient Instructions (Signed)
Order- Depo 84    Dx asthma mild intermittent  Sample and script Trrelegy Ellipta inhaler     Inhale 1 puff, then rinse mouth, once daily     Try this instead of Symbicort. See if it can keep you feeling more stable.  Flu vax- senior  Consider trying otc antihistamine Allegra/ fexofenadine instead of claritin

## 2017-02-05 NOTE — Assessment & Plan Note (Signed)
Mild exacerbation Plan-Depo-Medrol, start Trelegy

## 2017-02-05 NOTE — Assessment & Plan Note (Signed)
I think he may be describing mild eustachian dysfunction Plan-Depo-Medrol

## 2017-02-15 DIAGNOSIS — J4540 Moderate persistent asthma, uncomplicated: Secondary | ICD-10-CM | POA: Diagnosis not present

## 2017-02-15 DIAGNOSIS — K5904 Chronic idiopathic constipation: Secondary | ICD-10-CM | POA: Diagnosis not present

## 2017-03-07 DIAGNOSIS — E782 Mixed hyperlipidemia: Secondary | ICD-10-CM | POA: Diagnosis not present

## 2017-03-07 DIAGNOSIS — M150 Primary generalized (osteo)arthritis: Secondary | ICD-10-CM | POA: Diagnosis not present

## 2017-03-07 DIAGNOSIS — I10 Essential (primary) hypertension: Secondary | ICD-10-CM | POA: Diagnosis not present

## 2017-03-07 DIAGNOSIS — M545 Low back pain: Secondary | ICD-10-CM | POA: Diagnosis not present

## 2017-03-07 DIAGNOSIS — E038 Other specified hypothyroidism: Secondary | ICD-10-CM | POA: Diagnosis not present

## 2017-03-07 DIAGNOSIS — J449 Chronic obstructive pulmonary disease, unspecified: Secondary | ICD-10-CM | POA: Diagnosis not present

## 2017-03-20 ENCOUNTER — Encounter: Payer: Self-pay | Admitting: Allergy and Immunology

## 2017-03-20 ENCOUNTER — Ambulatory Visit: Payer: Medicare HMO | Admitting: Allergy and Immunology

## 2017-03-20 VITALS — BP 120/60 | HR 76 | Resp 18

## 2017-03-20 DIAGNOSIS — J4540 Moderate persistent asthma, uncomplicated: Secondary | ICD-10-CM

## 2017-03-20 DIAGNOSIS — J3089 Other allergic rhinitis: Secondary | ICD-10-CM | POA: Diagnosis not present

## 2017-03-20 NOTE — Patient Instructions (Signed)
  1. Continue to perform Allergen avoidance measures  2. Every day utilize the following to prevent inflammation:   A. Trelergy one inhalation once a day   B. OTC Nasacort - one spray each nostril one time per day  3. If needed:   A. Proventil HFA 2 puffs every 4-6 hours  B. DuoNeb every 4-6 hours  C. Allegra 10 mg one tablet one time per day  4. Return to clinic in 6 months or earlier if problem

## 2017-03-20 NOTE — Progress Notes (Signed)
Follow-up Note  Referring Provider: [name],* Primary Provider: [name], MD Date of Office Visit: 03/20/2017  Subjective:   James Iha Sr. (DOB: 24-Jun-1931) is a 83 y.o. male who returns to the [facility] on 03/20/2017 in re-evaluation of the following:  HPI: [name] returns to this clinic in reevaluation of his asthma and allergic rhinoconjunctivitis.  His last visit to this clinic was November 23, 2016.  Apparently he developed a problem with dizziness in September and it was believed to be secondary to his Symbicort and he discontinued this agent and was started on Trelergy.  It sounds like he required meclizine for a short period in time even though he did stop his Symbicort so there is a question of whether or not Symbicort was actually causing this problem.  But in any regard he is really doing very well regarding his respiratory tract issue and rarely uses a short acting bronchodilator averaging out to twice a month and can exert himself with no problem.  He has not required a systemic steroid or an antibiotic to treat any type of respiratory tract issue.  He has received the flu vaccine for this year.  Allergies as of 03/20/2017      Reactions   Clarithromycin    REACTION: GI upset      Medication List      albuterol 108 (90 Base) MCG/ACT inhaler Commonly known as:  PROVENTIL HFA;VENTOLIN HFA Inhale 2 puffs into the lungs every 6 (six) hours as needed for wheezing or shortness of breath.   ALLEGRA PO Take daily as needed by mouth.   Fluticasone-Umeclidin-Vilant 100-62.5-25 MCG/INH Aepb Commonly known as:  TRELEGY ELLIPTA Inhale 1 puff into the lungs daily. Rinse mouth, once daily   ipratropium-albuterol 0.5-2.5 (3) MG/3ML Soln Commonly known as:  DUONEB Take 3 mLs by nebulization every 4 (four) hours as needed.   MULTIVITAMIN ADULT PO Take by mouth.   NASACORT ALLERGY 24HR 55 MCG/ACT Aero nasal inhaler Generic  drug:  triamcinolone Place 1 spray into the nose daily.   SYNTHROID 175 MCG tablet Generic drug:  levothyroxine Take 1 tablet by mouth daily.       Past Medical History:  Diagnosis Date  . Allergic rhinitis   . History of prostate cancer    seed implant  . Hyperlipemia   . Mild asthma   . Rhinosinusitis     Past Surgical History:  Procedure Laterality Date  . APPENDECTOMY      Review of systems negative except as noted in HPI / PMHx or noted below:  Review of Systems  Constitutional: Negative.   HENT: Negative.   Eyes: Negative.   Respiratory: Negative.   Cardiovascular: Negative.   Gastrointestinal: Negative.   Genitourinary: Negative.   Musculoskeletal: Negative.   Skin: Negative.   Neurological: Negative.   Endo/Heme/Allergies: Negative.   Psychiatric/Behavioral: Negative.      Objective:   Vitals:   03/21/17 0847  BP: 120/60  Pulse: 76  Resp: 18          Physical Exam  Constitutional: He is well-developed, well-nourished, and in no distress.  HENT:  Head: Normocephalic.  Right Ear: Tympanic membrane, external ear and ear canal normal.  Left Ear: Tympanic membrane, external ear and ear canal normal.  Nose: Nose normal. No mucosal edema or rhinorrhea.  Mouth/Throat: Uvula is midline, oropharynx is clear and moist and mucous membranes are normal. No oropharyngeal exudate.  Eyes: Conjunctivae are normal.  Neck: Trachea normal. No tracheal tenderness present. No tracheal deviation present. No thyromegaly present.  Cardiovascular: Normal rate, regular rhythm, S1 normal, S2 normal and normal heart sounds.  No murmur heard. Pulmonary/Chest: Breath sounds normal. No stridor. No respiratory distress. He has no wheezes. He has no rales.  Musculoskeletal: He exhibits no edema.  Lymphadenopathy:       Head (right side): No tonsillar adenopathy present.       Head (left side): No tonsillar adenopathy present.    He has no cervical adenopathy.    Neurological: He is alert. Gait normal.  Skin: No rash noted. He is not diaphoretic. No erythema. Nails show no clubbing.  Psychiatric: Mood and affect normal.    Diagnostics:    Spirometry was performed and demonstrated an FEV1 of 2.52 at 97 % of predicted.  The patient had an Asthma Control Test with the following results: ACT Total Score: 23.    Assessment and Plan:   1. Asthma, moderate persistent, well-controlled   2. Other allergic rhinitis     1. Continue to perform Allergen avoidance measures  2. Every day utilize the following to prevent inflammation:   A. Trelergy one inhalation once a day   B. OTC Nasacort - one spray each nostril one time per day  3. If needed:   A. Proventil HFA 2 puffs every 4-6 hours  B. DuoNeb every 4-6 hours  C. Allegra 10 mg one tablet one time per day  4. Return to clinic in 6 months or earlier if problem  Jt really is doing very well ever since he started an inhaled steroid with a leukotriene modifier and a nasal steroid to treat his respiratory tract inflammatory condition.  He is able to go through the entire year with very little respiratory tract symptoms and no need for systemic steroid or antibiotic.  I would like to continue to have him use the agents noted above and I will see him back in this clinic in 6 months or earlier if there is a problem.  Allena Katz, MD [facility] [place]

## 2017-03-21 ENCOUNTER — Encounter: Payer: Self-pay | Admitting: Allergy and Immunology

## 2017-06-02 DIAGNOSIS — J441 Chronic obstructive pulmonary disease with (acute) exacerbation: Secondary | ICD-10-CM | POA: Diagnosis not present

## 2017-06-15 DIAGNOSIS — J441 Chronic obstructive pulmonary disease with (acute) exacerbation: Secondary | ICD-10-CM | POA: Diagnosis not present

## 2017-07-03 DIAGNOSIS — I10 Essential (primary) hypertension: Secondary | ICD-10-CM | POA: Diagnosis not present

## 2017-07-03 DIAGNOSIS — M150 Primary generalized (osteo)arthritis: Secondary | ICD-10-CM | POA: Diagnosis not present

## 2017-07-03 DIAGNOSIS — M545 Low back pain: Secondary | ICD-10-CM | POA: Diagnosis not present

## 2017-07-03 DIAGNOSIS — H906 Mixed conductive and sensorineural hearing loss, bilateral: Secondary | ICD-10-CM | POA: Diagnosis not present

## 2017-07-03 DIAGNOSIS — C61 Malignant neoplasm of prostate: Secondary | ICD-10-CM | POA: Diagnosis not present

## 2017-07-03 DIAGNOSIS — J449 Chronic obstructive pulmonary disease, unspecified: Secondary | ICD-10-CM | POA: Diagnosis not present

## 2017-07-03 DIAGNOSIS — E782 Mixed hyperlipidemia: Secondary | ICD-10-CM | POA: Diagnosis not present

## 2017-07-03 DIAGNOSIS — E038 Other specified hypothyroidism: Secondary | ICD-10-CM | POA: Diagnosis not present

## 2017-07-12 ENCOUNTER — Ambulatory Visit: Payer: PRIVATE HEALTH INSURANCE | Admitting: Internal Medicine

## 2017-07-14 ENCOUNTER — Ambulatory Visit (INDEPENDENT_AMBULATORY_CARE_PROVIDER_SITE_OTHER)
Admission: RE | Admit: 2017-07-14 | Discharge: 2017-07-14 | Disposition: A | Discharge status: Home / Self Care | Payer: Medicare HMO | Source: Ambulatory Visit | Attending: Internal Medicine | Admitting: Internal Medicine

## 2017-07-14 ENCOUNTER — Ambulatory Visit (INDEPENDENT_AMBULATORY_CARE_PROVIDER_SITE_OTHER): Payer: Medicare HMO | Admitting: Internal Medicine

## 2017-07-14 ENCOUNTER — Encounter: Payer: Self-pay | Admitting: Internal Medicine

## 2017-07-14 VITALS — BP 160/88 | HR 76 | Ht 72.0 in | Wt 123.0 lb

## 2017-07-14 DIAGNOSIS — J4520 Mild intermittent asthma, uncomplicated: Secondary | ICD-10-CM | POA: Diagnosis not present

## 2017-07-14 DIAGNOSIS — J302 Other seasonal allergic rhinitis: Secondary | ICD-10-CM

## 2017-07-14 DIAGNOSIS — J3089 Other allergic rhinitis: Secondary | ICD-10-CM | POA: Diagnosis not present

## 2017-07-14 DIAGNOSIS — J4530 Mild persistent asthma, uncomplicated: Secondary | ICD-10-CM

## 2017-07-14 DIAGNOSIS — J449 Chronic obstructive pulmonary disease, unspecified: Secondary | ICD-10-CM | POA: Diagnosis not present

## 2017-07-14 NOTE — Patient Instructions (Signed)
Order- CXR   Dx asthma, mild persistent uncomplicated  Ok to continue Trelegy and call for refills if needed

## 2017-07-14 NOTE — Assessment & Plan Note (Signed)
Chronic mild asthmatic bronchitis.  Resolved exacerbation from last month and seems to be at baseline now.  He is very pleased with Trelegy which can continue. I told him it would not hurt anything if he missed an occasional dose, but it will be most useful as a continued maintenance medicine.

## 2017-07-14 NOTE — Progress Notes (Signed)
Subjective:    Patient ID: Drexel Iha, male    DOB: Dec 08, 1931, 84 y.o.   MRN: [id number]  HPI  male never smoker followed for allergic rhinitis, mild intermittent asthma, history sinusitis, complicated by history prostate cancer, hyperlipidemia, hypothyroid FENO 08/22/16- 19- WNL Office Spirometry 08/22/16- WNL- FVC 3.65/ 85%, FEV1 2.70-/ 89%, ratio 0.74, FEF 25-75% 2.03/ 98% ------------------------------------------------------------------------------------------ 02/01/17- 84 year old male never smoker followed for allergic rhinitis, mild asthma, history sinusitis, complicated by history prostate cancer, hyperlipidemia, hypothyroid                   Daughter here FOLLOWS FOR: Pt c/o slight increase in SOB, light-headed, ear stuffiness x 2 weeks but states this has imporved since completing hte abx and pred that CY sent to pharmacy on 01/19/2017. Pt denies cough, chest congestion, CP/tightness, f/c/s.  He has been working with Dr. Neldon Mc but so far has not started allergy shots. He reports skin testing was positive for dog and cat (he has both ), and grass. Complains of full pressure feeling in his ears, somewhat increased short of breath and feels he can't take a deep comfortable breath. Not actually coughing or wheezing.  This may have begun with recent seasonal weather changes.  07/14/17- 84 year old male never smoker followed for allergic rhinitis, mild asthma, history sinusitis, complicated by history prostate cancer, hyperlipidemia, hypothyroid                   Daughter here ----FOLLOWS FOR: 6 month f/u for asthma. Pt was changed from Symbicort to Trelegy at last OV in 01/2017. Pt states he is still doing well on the trelegy daily and states sometimes he feels so good he will skip a dose. Pt states he only has to use his albuterol hfa once a week. Pt states overall he feels well. Pt has an intermittent prod cough with clear mucus. Pt denies CP/tightness and f/c/s.  BP 160/88 Trelegy,  nebulizer DuoNeb, albuterol HFA, Nasacort Always a little sense of chest congestion.  Bronchitis episode last month treated by PCP with 10 days of amoxicillin, resolved.  Long time since last CXR.  ROS-see HPI  + = positive Constitutional:   No-   weight loss, night sweats, fevers, chills, fatigue, lassitude. HEENT:   No-  headaches, difficulty swallowing, tooth/dental problems, sore throat,       No-  sneezing, itching, ear ache, +nasal congestion, +post nasal drip,  CV:  No-   chest pain, orthopnea, PND, swelling in lower extremities, anasarca, dizziness, palpitations Resp: + shortness of breath with exertion or at rest.               productive cough,  + non-productive cough,  No- coughing up of blood.           change in color of mucus.  No- wheezing.   Skin: No-   rash or lesions. GI:  No-   heartburn, indigestion, abdominal pain, nausea, vomiting,  GU:  MS:  No-   joint pain or swelling.   Neuro-     nothing unusual Psych:  No- change in mood or affect. No depression or anxiety.  No memory loss.  OBJ- Physical Exam General- Alert, Oriented, Affect-appropriate, Distress- none acute. + Thin, talkative Skin- rash-none, lesions- none, excoriation- none Lymphadenopathy- none Head- atraumatic            Eyes- Gross vision intact, PERRLA, conjunctivae and secretions clear            Ears- +  R perforated drum- known hx, + very HOH            Nose- Clear, no-Septal dev, mucus, polyps, erosion, perforation             Throat- Mallampati II , mucosa -clear, drainage-none seen, tonsils- atrophic, Neck- flexible , trachea midline, no stridor , thyroid nl, carotid no bruit Chest - symmetrical excursion , unlabored           Heart/CV- RRR , no murmur , no gallop  , no rub, nl s1 s2                           - JVD- none , edema- none, stasis changes- none, varices- none           Lung- +distant/unlabored, wheeze- none , dullness-none, rub- none           Chest wall-  Abd-  Br/ Gen/ Rectal-  Not done, not indicated Extrem- cyanosis- none, clubbing, none, atrophy- none, strength- nl Neuro- grossly intact to observation

## 2017-07-14 NOTE — Assessment & Plan Note (Signed)
He is not going to start allergy shots at this time with Dr. Neldon Mc in [place], but he understands that option can be discussed again if he feels appropriate.

## 2017-07-17 DIAGNOSIS — M810 Age-related osteoporosis without current pathological fracture: Secondary | ICD-10-CM | POA: Diagnosis not present

## 2017-07-18 DIAGNOSIS — E782 Mixed hyperlipidemia: Secondary | ICD-10-CM | POA: Diagnosis not present

## 2017-07-18 DIAGNOSIS — Z681 Body mass index (BMI) 19 or less, adult: Secondary | ICD-10-CM | POA: Diagnosis not present

## 2017-07-18 DIAGNOSIS — I10 Essential (primary) hypertension: Secondary | ICD-10-CM | POA: Diagnosis not present

## 2017-07-18 DIAGNOSIS — M810 Age-related osteoporosis without current pathological fracture: Secondary | ICD-10-CM | POA: Diagnosis not present

## 2017-07-18 DIAGNOSIS — E038 Other specified hypothyroidism: Secondary | ICD-10-CM | POA: Diagnosis not present

## 2017-07-18 DIAGNOSIS — Z0000 Encounter for general adult medical examination without abnormal findings: Secondary | ICD-10-CM | POA: Diagnosis not present

## 2017-08-23 DIAGNOSIS — J0100 Acute maxillary sinusitis, unspecified: Secondary | ICD-10-CM | POA: Diagnosis not present

## 2017-08-25 ENCOUNTER — Other Ambulatory Visit: Payer: Self-pay | Admitting: Internal Medicine

## 2017-09-20 ENCOUNTER — Ambulatory Visit: Payer: Medicare HMO | Admitting: Allergy and Immunology

## 2017-09-27 ENCOUNTER — Telehealth: Payer: Self-pay | Admitting: Internal Medicine

## 2017-09-27 ENCOUNTER — Ambulatory Visit (INDEPENDENT_AMBULATORY_CARE_PROVIDER_SITE_OTHER): Payer: Medicare HMO | Admitting: Internal Medicine

## 2017-09-27 ENCOUNTER — Encounter: Payer: Self-pay | Admitting: Internal Medicine

## 2017-09-27 DIAGNOSIS — R42 Dizziness and giddiness: Secondary | ICD-10-CM

## 2017-09-27 DIAGNOSIS — J4520 Mild intermittent asthma, uncomplicated: Secondary | ICD-10-CM

## 2017-09-27 NOTE — Progress Notes (Signed)
Subjective:    Patient ID: [name], male    DOB: 1931/12/04, 84 y.o.   MRN: [id number]  HPI  male never smoker followed for allergic rhinitis, mild intermittent asthma, history sinusitis, complicated by history prostate cancer, hyperlipidemia, hypothyroid FENO 08/22/16- 19- WNL Office Spirometry 08/22/16- WNL- FVC 3.65/ 85%, FEV1 2.70-/ 89%, ratio 0.74, FEF 25-75% 2.03/ 98% ------------------------------------------------------------------------------------------ 07/14/17- 84 year old male never smoker followed for allergic rhinitis, mild asthma, history sinusitis, complicated by history prostate cancer, hyperlipidemia, hypothyroid                   Daughter here ----FOLLOWS FOR: 6 month f/u for asthma. Pt was changed from Symbicort to Trelegy at last OV in 01/2017. Pt states he is still doing well on the trelegy daily and states sometimes he feels so good he will skip a dose. Pt states he only has to use his albuterol hfa once a week. Pt states overall he feels well. Pt has an intermittent prod cough with clear mucus. Pt denies CP/tightness and f/c/s.  BP 160/88 Trelegy, nebulizer DuoNeb, albuterol HFA, Nasacort Always a little sense of chest congestion.  Bronchitis episode last month treated by PCP with 10 days of amoxicillin, resolved.  Long time since last CXR.  09/27/2017- 84 year old male never smoker followed for allergic rhinitis, mild asthma, history sinusitis, complicated by history prostate cancer, hyperlipidemia, hypothyroid                   Daughter here ----Asthma: Pt notes dizziness with lightheaded-has stopped Trelegy past few days as he feels that its making him dizzy. Pt is SOB as well and has used nebulizer.  Had used Trelegy for several weeks. Then thought it made him dizzy so he stopped. Still sometimes dizzy despite off Trelegy x 5 days. Has been using either rescue inhaler or nebulizer about once daily. Feels breathing at baseline with little wheeze or cough. Denies  bleeding, chest pain, palpitation, discolored sputum or fever. Can;t keep weight up. Started Boost. CXR 07/14/2017 IMPRESSION: 1. No active cardiopulmonary disease. No evidence of pneumonia or pulmonary edema. 2. COPD. 3. Aortic atherosclerosis.  ROS-see HPI  + = positive Constitutional:   +  weight loss, night sweats, fevers, chills, fatigue, lassitude. HEENT:   No-  headaches, difficulty swallowing, tooth/dental problems, sore throat,       No-  sneezing, itching, ear ache, +nasal congestion, +post nasal drip,  CV:  No-   chest pain, orthopnea, PND, swelling in lower extremities, anasarca, dizziness, palpitations Resp: + shortness of breath with exertion or at rest.               productive cough,  + non-productive cough,  No- coughing up of blood.           change in color of mucus.  No- wheezing.   Skin: No-   rash or lesions. GI:  No-   heartburn, indigestion, abdominal pain, nausea, vomiting,  GU:  MS:  No-   joint pain or swelling.   Neuro-     nothing unusual Psych:  No- change in mood or affect. No depression or anxiety.  No memory loss.  OBJ- Physical Exam General- Alert, Oriented, Affect-appropriate, Distress- none acute. + Thin, talkative Skin- rash-none, lesions- none, excoriation- none Lymphadenopathy- none Head- atraumatic            Eyes- Gross vision intact, PERRLA, conjunctivae and secretions clear            Ears- + R  perforated drum- known hx, + very HOH            Nose- Clear, no-Septal dev, mucus, polyps, erosion, perforation             Throat- Mallampati II , mucosa -clear, drainage-none seen, tonsils- atrophic, Neck- flexible , trachea midline, no stridor , thyroid nl, carotid no bruit Chest - symmetrical excursion , unlabored           Heart/CV- RRR , no murmur , no gallop  , no rub, nl s1 s2                           - JVD- none , edema- none, stasis changes- none, varices- none           Lung- +distant/unlabored, wheeze- none , dullness-none, rub- none            Chest wall-  Abd-  Br/ Gen/ Rectal- Not done, not indicated Extrem- cyanosis- none, clubbing, none, atrophy- none, strength- nl Neuro- grossly intact to observation

## 2017-09-27 NOTE — Assessment & Plan Note (Signed)
Not able to give clear description. Not clear if he is describing orthostasis, vertigo, or something else.  Plan- ok meclizine, careful driving and walking. Discuss with PCP.

## 2017-09-27 NOTE — Telephone Encounter (Signed)
OK 

## 2017-09-27 NOTE — Telephone Encounter (Signed)
Per CY- okay to double book today at 11:30a. [name] is aware that apt has been scheduled for 11:30 today. Nothing further is needed.

## 2017-09-27 NOTE — Assessment & Plan Note (Signed)
He thought Trelegy worked well, but currently at baseline, may not need it. Not at all clear that Trelegy is related to his "dizziness", but he will stay off it for now.  Plan- Stay off trelegy. Use rescue inhaler or nebulizer as needed/ discussed/.

## 2017-09-27 NOTE — Patient Instructions (Addendum)
Ok to see how you do off Trelegy  You can use your rescue inhaler and nebulizer if needed, as before  If you keep having dizziness, please ask Dr Henrene Pastor to check it out.   Call if we can help  Ok to keep September 11 appointment

## 2017-09-27 NOTE — Telephone Encounter (Signed)
Called and spoke to pt's daughter, [name].  [name] states pt has been experiencing dizziness & increased sob with exertion x2w Pt has held trelegy the past 3-4 days, due to increased dizziness after taking medication. Denied additional symptoms. [name] is requesting an apt with CY today.     CY please advise. Thanks  Current Outpatient Medications on File Prior to Visit  Medication Sig Dispense Refill  . Fexofenadine HCl (ALLEGRA PO) Take daily as needed by mouth.    . Fluticasone-Umeclidin-Vilant (TRELEGY ELLIPTA) 100-62.5-25 MCG/INH AEPB Inhale 1 puff into the lungs daily. Rinse mouth, once daily 1 each 12  . ipratropium-albuterol (DUONEB) 0.5-2.5 (3) MG/3ML SOLN Take 3 mLs by nebulization every 4 (four) hours as needed.    . Multiple Vitamins-Minerals (MULTIVITAMIN ADULT PO) Take by mouth.    . SYNTHROID 175 MCG tablet Take 1 tablet by mouth daily.    Marland Kitchen triamcinolone (NASACORT ALLERGY 24HR) 55 MCG/ACT AERO nasal inhaler Place 1 spray into the nose daily as needed.     . VENTOLIN HFA 108 (90 Base) MCG/ACT inhaler INHALE 2 PUFFS INTO THE LUNGS EVERY 6 HOURS AS NEEDED FOR WHEEZING OR SHORTNESS OF BREATH 18 Inhaler 2   No current facility-administered medications on file prior to visit.     Allergies  Allergen Reactions  . Clarithromycin     REACTION: GI upset

## 2017-10-16 ENCOUNTER — Ambulatory Visit: Payer: Medicare HMO | Admitting: Internal Medicine

## 2017-10-27 DIAGNOSIS — J411 Mucopurulent chronic bronchitis: Secondary | ICD-10-CM | POA: Diagnosis not present

## 2017-10-27 DIAGNOSIS — M810 Age-related osteoporosis without current pathological fracture: Secondary | ICD-10-CM | POA: Diagnosis not present

## 2017-10-27 DIAGNOSIS — I10 Essential (primary) hypertension: Secondary | ICD-10-CM | POA: Diagnosis not present

## 2017-10-27 DIAGNOSIS — E782 Mixed hyperlipidemia: Secondary | ICD-10-CM | POA: Diagnosis not present

## 2017-10-27 DIAGNOSIS — E038 Other specified hypothyroidism: Secondary | ICD-10-CM | POA: Diagnosis not present

## 2017-10-27 DIAGNOSIS — H906 Mixed conductive and sensorineural hearing loss, bilateral: Secondary | ICD-10-CM | POA: Diagnosis not present

## 2017-10-27 DIAGNOSIS — M150 Primary generalized (osteo)arthritis: Secondary | ICD-10-CM | POA: Diagnosis not present

## 2017-10-27 DIAGNOSIS — R42 Dizziness and giddiness: Secondary | ICD-10-CM | POA: Diagnosis not present

## 2017-10-27 DIAGNOSIS — C61 Malignant neoplasm of prostate: Secondary | ICD-10-CM | POA: Diagnosis not present

## 2017-11-20 DIAGNOSIS — J301 Allergic rhinitis due to pollen: Secondary | ICD-10-CM | POA: Diagnosis not present

## 2017-12-21 DIAGNOSIS — A770 Spotted fever due to Rickettsia rickettsii: Secondary | ICD-10-CM | POA: Diagnosis not present

## 2018-01-17 ENCOUNTER — Encounter: Payer: Self-pay | Admitting: Internal Medicine

## 2018-01-17 ENCOUNTER — Ambulatory Visit (INDEPENDENT_AMBULATORY_CARE_PROVIDER_SITE_OTHER): Payer: Medicare HMO | Admitting: Internal Medicine

## 2018-01-17 DIAGNOSIS — Z23 Encounter for immunization: Secondary | ICD-10-CM | POA: Diagnosis not present

## 2018-01-17 DIAGNOSIS — J4520 Mild intermittent asthma, uncomplicated: Secondary | ICD-10-CM

## 2018-01-17 DIAGNOSIS — J302 Other seasonal allergic rhinitis: Secondary | ICD-10-CM

## 2018-01-17 DIAGNOSIS — J3089 Other allergic rhinitis: Secondary | ICD-10-CM

## 2018-01-17 NOTE — Assessment & Plan Note (Signed)
No recent exacerbation.  He and his daughter are satisfied with current medications.  Refills not needed. Plan-flu vaccine

## 2018-01-17 NOTE — Assessment & Plan Note (Signed)
Allegra and Nasacort have been sufficient if needed

## 2018-01-17 NOTE — Patient Instructions (Signed)
We can continue present meds  Order- Flu vax- senior  Please call if we can help

## 2018-01-17 NOTE — Progress Notes (Signed)
Subjective:    Patient ID: Drexel Iha, male    DOB: 1932/02/09, 84 y.o.   MRN: [id number]  HPI  male never smoker followed for allergic rhinitis, mild intermittent asthma, history sinusitis, complicated by history prostate cancer, hyperlipidemia, hypothyroid FENO 08/22/16- 19- WNL Office Spirometry 08/22/16- WNL- FVC 3.65/ 85%, FEV1 2.70-/ 89%, ratio 0.74, FEF 25-75% 2.03/ 98% ------------------------------------------------------------------------------------------  09/27/2017- 84 year old male never smoker followed for allergic rhinitis, mild asthma, history sinusitis, complicated by history prostate cancer, hyperlipidemia, hypothyroid                   Daughter here ----Asthma: Pt notes dizziness with lightheaded-has stopped Trelegy past few days as he feels that its making him dizzy. Pt is SOB as well and has used nebulizer.  Had used Trelegy for several weeks. Then thought it made him dizzy so he stopped. Still sometimes dizzy despite off Trelegy x 5 days. Has been using either rescue inhaler or nebulizer about once daily. Feels breathing at baseline with little wheeze or cough. Denies bleeding, chest pain, palpitation, discolored sputum or fever. Can;t keep weight up. Started Boost. CXR 07/14/2017 IMPRESSION: 1. No active cardiopulmonary disease. No evidence of pneumonia or pulmonary edema. 2. COPD. 3. Aortic atherosclerosis.  01/17/2018- 84 year old male never smoker followed for allergic rhinitis, mild asthma, history sinusitis, complicated by history prostate cancer, hyperlipidemia, hypothyroid                   Daughter here -----Follow up for asthma. Per patient, he has been doing well since last visit.  Ventolin HFA, Trelegy, Nasacort, neb DuoNeb, Allegra Treated with doxycycline for tick bites this summer and has follow-up with his PCP. Daughter is here today and together they report good summer with no exacerbations.  Medications are working well and he feels stable.   Up-to-date on pneumonia vaccine and we discussed vaccines scheduling.  Needs flu shot. Little routine cough or wheeze.  Occasional use of rescue inhaler.  Sleep not being disturbed.  ROS-see HPI  + = positive Constitutional:   +  weight loss, night sweats, fevers, chills, fatigue, lassitude. HEENT:   No-  headaches, difficulty swallowing, tooth/dental problems, sore throat,       No-  sneezing, itching, ear ache, +nasal congestion, +post nasal drip,  CV:  No-   chest pain, orthopnea, PND, swelling in lower extremities, anasarca, dizziness, palpitations Resp: + shortness of breath with exertion or at rest.               productive cough,   non-productive cough,  No- coughing up of blood.           change in color of mucus.  No- wheezing.   Skin: No-   rash or lesions. GI:  No-   heartburn, indigestion, abdominal pain, nausea, vomiting,  GU:  MS:  No-   joint pain or swelling.   Neuro-     nothing unusual Psych:  No- change in mood or affect. No depression or anxiety.  No memory loss.  OBJ- Physical Exam General- Alert, Oriented, Affect-appropriate, Distress- none acute. + Thin, talkative Skin- rash-none, lesions- none, excoriation- none Lymphadenopathy- none Head- atraumatic            Eyes- Gross vision intact, PERRLA, conjunctivae and secretions clear            Ears- + R perforated drum- known hx, + very HOH            Nose- Clear, no-Septal dev,  mucus, polyps, erosion, perforation             Throat- Mallampati II , mucosa -clear, drainage-none seen, tonsils- atrophic, Neck- flexible , trachea midline, no stridor , thyroid nl, carotid no bruit Chest - symmetrical excursion , unlabored           Heart/CV- RRR , no murmur , no gallop  , no rub, nl s1 s2                           - JVD- none , edema- none, stasis changes- none, varices- none           Lung- +distant/unlabored, wheeze- none , dullness-none, rub- none           Chest wall-  Abd-  Br/ Gen/ Rectal- Not done, not  indicated Extrem- cyanosis- none, clubbing, none, atrophy- none, strength- nl Neuro- grossly intact to observation

## 2018-02-01 DIAGNOSIS — E782 Mixed hyperlipidemia: Secondary | ICD-10-CM | POA: Diagnosis not present

## 2018-02-01 DIAGNOSIS — I10 Essential (primary) hypertension: Secondary | ICD-10-CM | POA: Diagnosis not present

## 2018-02-01 DIAGNOSIS — M810 Age-related osteoporosis without current pathological fracture: Secondary | ICD-10-CM | POA: Diagnosis not present

## 2018-02-01 DIAGNOSIS — J4540 Moderate persistent asthma, uncomplicated: Secondary | ICD-10-CM | POA: Diagnosis not present

## 2018-02-01 DIAGNOSIS — Z681 Body mass index (BMI) 19 or less, adult: Secondary | ICD-10-CM | POA: Diagnosis not present

## 2018-02-01 DIAGNOSIS — E038 Other specified hypothyroidism: Secondary | ICD-10-CM | POA: Diagnosis not present

## 2018-04-17 DIAGNOSIS — L2081 Atopic neurodermatitis: Secondary | ICD-10-CM | POA: Diagnosis not present

## 2018-05-25 DIAGNOSIS — J018 Other acute sinusitis: Secondary | ICD-10-CM | POA: Diagnosis not present

## 2018-06-08 DIAGNOSIS — J018 Other acute sinusitis: Secondary | ICD-10-CM | POA: Diagnosis not present

## 2018-06-12 DIAGNOSIS — J018 Other acute sinusitis: Secondary | ICD-10-CM | POA: Diagnosis not present

## 2018-07-09 ENCOUNTER — Other Ambulatory Visit: Payer: Self-pay | Admitting: Internal Medicine

## 2018-07-18 ENCOUNTER — Encounter: Payer: Self-pay | Admitting: Internal Medicine

## 2018-07-18 ENCOUNTER — Other Ambulatory Visit: Payer: Self-pay

## 2018-07-18 ENCOUNTER — Ambulatory Visit (INDEPENDENT_AMBULATORY_CARE_PROVIDER_SITE_OTHER): Payer: Medicare HMO | Admitting: Internal Medicine

## 2018-07-18 DIAGNOSIS — J4520 Mild intermittent asthma, uncomplicated: Secondary | ICD-10-CM

## 2018-07-18 DIAGNOSIS — R42 Dizziness and giddiness: Secondary | ICD-10-CM

## 2018-07-18 MED ORDER — ALBUTEROL SULFATE HFA 108 (90 BASE) MCG/ACT IN AERS: INHALATION_SPRAY | RESPIRATORY_TRACT | 12 refills | Status: DC

## 2018-07-18 NOTE — Progress Notes (Signed)
Subjective:    Patient ID: [name], male    DOB: [birth year], 85 y.o.   MRN: [id number]  HPI  male never smoker followed for allergic rhinitis, mild intermittent asthma, history sinusitis, complicated by history prostate cancer, hyperlipidemia, hypothyroid FENO 08/22/16- 19- WNL Office Spirometry 08/22/16- WNL- FVC 3.65/ 85%, FEV1 2.70-/ 89%, ratio 0.74, FEF 25-75% 2.03/ 98% ------------------------------------------------------------------------------------------ 01/17/2018- 85 year old male never smoker followed for allergic rhinitis, mild asthma, history sinusitis, complicated by history prostate cancer, hyperlipidemia, hypothyroid                   Daughter here -----Follow up for asthma. Per patient, he has been doing well since last visit.  Ventolin HFA, Trelegy, Nasacort, neb DuoNeb, Allegra Treated with doxycycline for tick bites this summer and has follow-up with his PCP. Daughter is here today and together they report good summer with no exacerbations.  Medications are working well and he feels stable.  Up-to-date on pneumonia vaccine and we discussed vaccines scheduling.  Needs flu shot. Little routine cough or wheeze.  Occasional use of rescue inhaler.  Sleep not being disturbed.  3/27/ 3339-  85 year old male never smoker followed for allergic rhinitis, mild asthma, history sinusitis, complicated by history prostate cancer, hyperlipidemia, hypothyroid  -----states breathing is stable, feels lightheaded when standing up in the morning 1-2 times/week, only uses albuterol when needed.                Daughter here Ventolin hfa, Neb Duoneb,  He denies chest pain, palpitation, sputum, adenopathy CXR 07/14/17 1. No active cardiopulmonary disease. No evidence of pneumonia or pulmonary edema. 2. COPD. 3. Aortic atherosclerosis.  ROS-see HPI  + = positive Constitutional:   +  weight loss, night sweats, fevers, chills, fatigue, lassitude. HEENT:   No-  headaches, difficulty  swallowing, tooth/dental problems, sore throat,       No-  sneezing, itching, ear ache, +nasal congestion, +post nasal drip,  CV:  No-   chest pain, orthopnea, PND, swelling in lower extremities, anasarca, dizziness, palpitations Resp: + shortness of breath with exertion or at rest.               productive cough,   non-productive cough,  No- coughing up of blood.           change in color of mucus.  No- wheezing.   Skin: No-   rash or lesions. GI:  No-   heartburn, indigestion, abdominal pain, nausea, vomiting,  GU:  MS:  No-   joint pain or swelling.   Neuro-     nothing unusual Psych:  No- change in mood or affect. No depression or anxiety.  No memory loss.  OBJ- Physical Exam General- Alert, Oriented, Affect-appropriate, Distress- none acute. + Thin, talkative Skin- rash-none, lesions- none, excoriation- none Lymphadenopathy- none Head- atraumatic            Eyes- Gross vision intact, PERRLA, conjunctivae and secretions clear            Ears- + R perforated drum- known hx, + very HOH            Nose- Clear, no-Septal dev, mucus, polyps, erosion, perforation             Throat- Mallampati II , mucosa -clear, drainage-none seen, tonsils- atrophic, Neck- flexible , trachea midline, no stridor , thyroid nl, carotid no bruit Chest - symmetrical excursion , unlabored           Heart/CV- RRR/ rare skip ,  no murmur , no gallop  , no rub, nl s1 s2                           - JVD- none , edema- none, stasis changes- none, varices- none           Lung- +distant/unlabored, wheeze- none , dullness-none, rub- none           Chest wall-  Abd-  Br/ Gen/ Rectal- Not done, not indicated Extrem- cyanosis- none, clubbing, none, atrophy- none, strength- nl Neuro- grossly intact to observation

## 2018-07-18 NOTE — Patient Instructions (Signed)
Ventolin was refilled  Discuss your dizziness with Dr Henrene Pastor    Please call as needed

## 2018-07-31 NOTE — Assessment & Plan Note (Signed)
He seems to be describing orthostasis.  Of asked him to review this with his PCP and we discussed care with changes of posture.

## 2018-07-31 NOTE — Assessment & Plan Note (Signed)
Doing well now with no recent exacerbation or infection. Plan-refill meds

## 2018-08-07 DIAGNOSIS — H6123 Impacted cerumen, bilateral: Secondary | ICD-10-CM | POA: Diagnosis not present

## 2018-08-16 DIAGNOSIS — S81831A Puncture wound without foreign body, right lower leg, initial encounter: Secondary | ICD-10-CM | POA: Diagnosis not present

## 2018-08-16 DIAGNOSIS — J301 Allergic rhinitis due to pollen: Secondary | ICD-10-CM | POA: Diagnosis not present

## 2018-09-12 DIAGNOSIS — E038 Other specified hypothyroidism: Secondary | ICD-10-CM | POA: Diagnosis not present

## 2018-09-12 DIAGNOSIS — I10 Essential (primary) hypertension: Secondary | ICD-10-CM | POA: Diagnosis not present

## 2018-09-12 DIAGNOSIS — E782 Mixed hyperlipidemia: Secondary | ICD-10-CM | POA: Diagnosis not present

## 2018-09-12 DIAGNOSIS — J449 Chronic obstructive pulmonary disease, unspecified: Secondary | ICD-10-CM | POA: Diagnosis not present

## 2018-09-12 DIAGNOSIS — Z1159 Encounter for screening for other viral diseases: Secondary | ICD-10-CM | POA: Diagnosis not present

## 2018-10-16 ENCOUNTER — Telehealth: Payer: Self-pay | Admitting: Internal Medicine

## 2018-10-16 NOTE — Telephone Encounter (Signed)
meds in question are symbicort trelegy and ellipta in which ins thinks that he has copd the fax # to the insurance company is [phone].Hillery Hunter

## 2018-10-16 NOTE — Telephone Encounter (Signed)
Called and spoke with Orland Jarred who stated pt is trying to get insurance through [place]. While speaking with Tyrone Schimke, I stated to him that pt is not on either Symbicort or Trelegy but stated to him that it seems like he was in the past. Also stated to him that we do not have him with a diagnosis of COPD. I told Tyrone Schimke that we see pt for asthma. Per Tyrone Schimke, they are needing a letter written stating this information and I also stated to [facility] that I would include pt's last OV and current med list so they can be able to have that to review as well and Tyrone Schimke verbalized understanding. Letter has been typed up and has been faxed to the provided fax number of [phone]. Nothing further needed.

## 2018-11-07 DIAGNOSIS — Z681 Body mass index (BMI) 19 or less, adult: Secondary | ICD-10-CM | POA: Diagnosis not present

## 2018-11-07 DIAGNOSIS — E782 Mixed hyperlipidemia: Secondary | ICD-10-CM | POA: Diagnosis not present

## 2018-11-07 DIAGNOSIS — Z1159 Encounter for screening for other viral diseases: Secondary | ICD-10-CM | POA: Diagnosis not present

## 2018-11-07 DIAGNOSIS — I10 Essential (primary) hypertension: Secondary | ICD-10-CM | POA: Diagnosis not present

## 2018-11-07 DIAGNOSIS — J441 Chronic obstructive pulmonary disease with (acute) exacerbation: Secondary | ICD-10-CM | POA: Diagnosis not present

## 2018-11-07 DIAGNOSIS — R42 Dizziness and giddiness: Secondary | ICD-10-CM | POA: Diagnosis not present

## 2018-11-07 DIAGNOSIS — E440 Moderate protein-calorie malnutrition: Secondary | ICD-10-CM | POA: Diagnosis not present

## 2018-11-07 DIAGNOSIS — E038 Other specified hypothyroidism: Secondary | ICD-10-CM | POA: Diagnosis not present

## 2018-11-13 DIAGNOSIS — Z1159 Encounter for screening for other viral diseases: Secondary | ICD-10-CM | POA: Diagnosis not present

## 2018-11-13 DIAGNOSIS — E038 Other specified hypothyroidism: Secondary | ICD-10-CM | POA: Diagnosis not present

## 2018-11-13 DIAGNOSIS — I10 Essential (primary) hypertension: Secondary | ICD-10-CM | POA: Diagnosis not present

## 2018-11-13 DIAGNOSIS — E782 Mixed hyperlipidemia: Secondary | ICD-10-CM | POA: Diagnosis not present

## 2018-11-27 DIAGNOSIS — I10 Essential (primary) hypertension: Secondary | ICD-10-CM | POA: Diagnosis not present

## 2018-11-27 DIAGNOSIS — R001 Bradycardia, unspecified: Secondary | ICD-10-CM | POA: Diagnosis not present

## 2018-11-27 DIAGNOSIS — E782 Mixed hyperlipidemia: Secondary | ICD-10-CM | POA: Diagnosis not present

## 2018-11-28 ENCOUNTER — Other Ambulatory Visit: Payer: Self-pay

## 2018-11-28 ENCOUNTER — Ambulatory Visit (INDEPENDENT_AMBULATORY_CARE_PROVIDER_SITE_OTHER): Payer: Medicare HMO | Admitting: Cardiology

## 2018-11-28 ENCOUNTER — Encounter: Payer: Self-pay | Admitting: Cardiology

## 2018-11-28 VITALS — BP 134/60 | HR 66 | Ht 72.0 in | Wt 128.0 lb

## 2018-11-28 DIAGNOSIS — R42 Dizziness and giddiness: Secondary | ICD-10-CM

## 2018-11-28 DIAGNOSIS — I517 Cardiomegaly: Secondary | ICD-10-CM

## 2018-11-28 DIAGNOSIS — I10 Essential (primary) hypertension: Secondary | ICD-10-CM | POA: Insufficient documentation

## 2018-11-28 DIAGNOSIS — I493 Ventricular premature depolarization: Secondary | ICD-10-CM | POA: Insufficient documentation

## 2018-11-28 DIAGNOSIS — R001 Bradycardia, unspecified: Secondary | ICD-10-CM

## 2018-11-28 HISTORY — DX: Cardiomegaly: I51.7

## 2018-11-28 HISTORY — DX: Bradycardia, unspecified: R00.1

## 2018-11-28 HISTORY — DX: Essential (primary) hypertension: I10

## 2018-11-28 HISTORY — DX: Ventricular premature depolarization: I49.3

## 2018-11-28 NOTE — Progress Notes (Signed)
Cardiology Consultation:    Date:  11/28/2018   ID:  James Iha Sr., DOB 10-29-1931, MRN [id number]  PCP:  [name], MD  Cardiologist:  [name], MD   Referring MD: [name],*   Chief Complaint  Patient presents with  . Bradycardia  . Hypertension  . Dizziness    History of Present Illness:    James Almquist Sr. is a 85 y.o. male who is being seen today for the evaluation of dizziness at the request of [name],*.  Yesterday he went to his primary care physician for regular follow-up.  His past was checked was 34.  After that EKG was done which showed frequent ventricular ectopy.  He was referred to [place].  He said that for last few months he had episode of dizziness but does happen typically when he move around and walk around.  Turning his head quickly will provoke those episodes.  There is no passing out no dizziness other than described above.  Denies having any chest pain tightness squeezing pressure burning chest.  No nightmares.  No swelling of lower extremities in spite of his age he still trying to be active.   Past Medical History:  Diagnosis Date  . Allergic rhinitis   . History of prostate cancer    seed implant  . Hyperlipemia   . Mild asthma   . Rhinosinusitis     Past Surgical History:  Procedure Laterality Date  . APPENDECTOMY      Current Medications: Current Meds  Medication Sig  . albuterol (VENTOLIN HFA) 108 (90 Base) MCG/ACT inhaler INHALE 2 PUFFS INTO THE LUNGS EVERY 6 HOURS AS NEEDED FOR WHEEZING OR SHORTNESS OF BREATH  . alendronate (FOSAMAX) 70 MG tablet Take 70 mg by mouth once a week. Take with a full glass of water on an empty stomach.  . Fexofenadine HCl (ALLEGRA PO) Take daily as needed by mouth.  Marland Kitchen ipratropium-albuterol (DUONEB) 0.5-2.5 (3) MG/3ML SOLN Take 3 mLs by nebulization every 4 (four) hours as needed.  . Multiple Vitamins-Minerals (MULTIVITAMIN ADULT PO) Take by mouth.  .  Nutritional Supplements (BOOST NUTRITIONAL ENERGY PO) Take by mouth.  . SYNTHROID 112 MCG tablet Take 1 tablet by mouth 2 (two) times daily.   Marland Kitchen triamcinolone (NASACORT ALLERGY 24HR) 55 MCG/ACT AERO nasal inhaler Place 1 spray into the nose daily as needed.      Allergies:   Clarithromycin   Social History   Socioeconomic History  . Marital status: Married    Spouse name: Not on file  . Number of children: Not on file  . Years of education: Not on file  . Highest education level: Not on file  Occupational History  . Occupation: Retired    Fish farm manager: OTHER    Comment: Scientist, research (life sciences)  Social Needs  . Financial resource strain: Not on file  . Food insecurity    Worry: Not on file    Inability: Not on file  . Transportation needs    Medical: Not on file    Non-medical: Not on file  Tobacco Use  . Smoking status: Never Smoker  . Smokeless tobacco: Never Used  Substance and Sexual Activity  . Alcohol use: No  . Drug use: No  . Sexual activity: Not on file  Lifestyle  . Physical activity    Days per week: Not on file    Minutes per session: Not on file  . Stress: Not on file  Relationships  .  Social Herbalist on phone: Not on file    Gets together: Not on file    Attends religious service: Not on file    Active member of club or organization: Not on file    Attends meetings of clubs or organizations: Not on file    Relationship status: Not on file  Other Topics Concern  . Not on file  Social History Narrative  . Not on file     Family History: The patient's family history includes Alzheimer's disease in his father; Diabetes in his mother. ROS:   Please see the history of present illness.    All 14 point review of systems negative except as described per history of present illness.  EKGs/Labs/Other Studies Reviewed:    The following studies were reviewed today:   EKG:  EKG is  ordered today.  The ekg ordered today demonstrates normal sinus  rhythm normal P interval evidence of left ventricular hypertrophy, PVCs that are positive in lead I.  No acute ST segment changes  Recent Labs: No results found for requested labs within last 8760 hours.  Recent Lipid Panel No results found for: CHOL, TRIG, HDL, CHOLHDL, VLDL, LDLCALC, LDLDIRECT  Physical Exam:    VS:  BP 134/60   Pulse 66   Ht 6' (1.829 m)   Wt 128 lb (58.1 kg)   SpO2 99%   BMI 17.36 kg/m     Wt Readings from Last 3 Encounters:  11/28/18 128 lb (58.1 kg)  07/18/18 122 lb 3.2 oz (55.4 kg)  01/17/18 123 lb (55.8 kg)     GEN:  Well nourished, well developed in no acute distress HEENT: Normal NECK: No JVD; No carotid bruits LYMPHATICS: No lymphadenopathy CARDIAC: RRR, no murmurs, no rubs, no gallops RESPIRATORY:  Clear to auscultation without rales, wheezing or rhonchi  ABDOMEN: Soft, non-tender, non-distended MUSCULOSKELETAL:  No edema; No deformity  SKIN: Warm and dry NEUROLOGIC:  Alert and oriented x 3 PSYCHIATRIC:  Normal affect   ASSESSMENT:    1. Dizziness   2. Bradycardia   3. Ventricular ectopy   4. Essential hypertension   5. LVH (left ventricular hypertrophy)    PLAN:    In order of problems listed above:  1. Dizziness the key will be to correlate his symptoms with EKG.  I will ask him to wear 0 patch for a week.  As a part of evaluation I will schedule him to have echocardiogram to assess left ventricular ejection fraction. 2. Bradycardia I suspect the reason for bradycardia is frequent ventricular ectopy.  We will do monitor to prove it. 3. Ventricular ectopy.  Echocardiogram will be done to assess left ventricular ejection fraction.  Monitor will be done to assess the burden of extrasystole.  Future recommendations will be made Based on the results of this test. 4. Essential hypertension recent diagnosis however on the EKG I do see evidence of left ventricle hypertrophy.  Will wait for echocardiogram before deciding about treatment.    Medication Adjustments/Labs and Tests Ordered: Current medicines are reviewed at length with the patient today.  Concerns regarding medicines are outlined above.  No orders of the defined types were placed in this encounter.  No orders of the defined types were placed in this encounter.   Signed, Park Liter, MD, [facility]. 11/28/2018 11:41 AM    [place]

## 2018-11-28 NOTE — Patient Instructions (Signed)
Medication Instructions:  Your physician recommends that you continue on your current medications as directed. Please refer to the Current Medication list given to you today.  If you need a refill on your cardiac medications before your next appointment, please call your pharmacy.   Lab work: None.  If you have labs (blood work) drawn today and your tests are completely normal, you will receive your results only by: Marland Kitchen MyChart Message (if you have MyChart) OR . A paper copy in the mail If you have any lab test that is abnormal or we need to change your treatment, we will call you to review the results.  Testing/Procedures: Your physician has requested that you have an echocardiogram. Echocardiography is a painless test that uses sound waves to create images of your heart. It provides your doctor with information about the size and shape of your heart and how well your heart's chambers and valves are working. This procedure takes approximately one hour. There are no restrictions for this procedure.  Your physician has recommended that you wear a holter monitor. Holter monitors are medical devices that record the heart's electrical activity. Doctors most often use these monitors to diagnose arrhythmias. Arrhythmias are problems with the speed or rhythm of the heartbeat. The monitor is a small, portable device. You can wear one while you do your normal daily activities. This is usually used to diagnose what is causing palpitations/syncope (passing out). Wear for 7 days.      Follow-Up: At [facility], you and your health needs are our priority.  As part of our continuing mission to provide you with exceptional heart care, we have created designated Provider Care Teams.  These Care Teams include your primary Cardiologist (physician) and Advanced Practice Providers (APPs -  Physician Assistants and Nurse Practitioners) who all work together to provide you with the care you need, when you need it.  You will need a follow up appointment in 6 weeks.  Please call our office 2 months in advance to schedule this appointment.  You may see No primary care provider on file. or another member of our [employer] Provider Team in [place]: Shirlee More, MD . Jyl Heinz, MD  Any Other Special Instructions Will Be Listed Below (If Applicable).   Echocardiogram An echocardiogram is a procedure that uses painless sound waves (ultrasound) to produce an image of the heart. Images from an echocardiogram can provide important information about:  Signs of coronary artery disease (CAD).  Aneurysm detection. An aneurysm is a weak or damaged part of an artery wall that bulges out from the normal force of blood pumping through the body.  Heart size and shape. Changes in the size or shape of the heart can be associated with certain conditions, including heart failure, aneurysm, and CAD.  Heart muscle function.  Heart valve function.  Signs of a past heart attack.  Fluid buildup around the heart.  Thickening of the heart muscle.  A tumor or infectious growth around the heart valves. Tell a health care provider about:  Any allergies you have.  All medicines you are taking, including vitamins, herbs, eye drops, creams, and over-the-counter medicines.  Any blood disorders you have.  Any surgeries you have had.  Any medical conditions you have.  Whether you are pregnant or may be pregnant. What are the risks? Generally, this is a safe procedure. However, problems may occur, including:  Allergic reaction to dye (contrast) that may be used during the procedure. What happens before the  procedure? No specific preparation is needed. You may eat and drink normally. What happens during the procedure?   An IV tube may be inserted into one of your veins.  You may receive contrast through this tube. A contrast is an injection that improves the quality of the pictures from your heart.  A  gel will be applied to your chest.  A wand-like tool (transducer) will be moved over your chest. The gel will help to transmit the sound waves from the transducer.  The sound waves will harmlessly bounce off of your heart to allow the heart images to be captured in real-time motion. The images will be recorded on a computer. The procedure may vary among health care providers and hospitals. What happens after the procedure?  You may return to your normal, everyday life, including diet, activities, and medicines, unless your health care provider tells you not to do that. Summary  An echocardiogram is a procedure that uses painless sound waves (ultrasound) to produce an image of the heart.  Images from an echocardiogram can provide important information about the size and shape of your heart, heart muscle function, heart valve function, and fluid buildup around your heart.  You do not need to do anything to prepare before this procedure. You may eat and drink normally.  After the echocardiogram is completed, you may return to your normal, everyday life, unless your health care provider tells you not to do that. This information is not intended to replace advice given to you by your health care provider. Make sure you discuss any questions you have with your health care provider. Document Released: 04/22/2000 Document Revised: 08/16/2018 Document Reviewed: 05/28/2016 Elsevier Patient Education  2020 [employer].

## 2018-12-04 ENCOUNTER — Other Ambulatory Visit (INDEPENDENT_AMBULATORY_CARE_PROVIDER_SITE_OTHER): Payer: PRIVATE HEALTH INSURANCE

## 2018-12-04 DIAGNOSIS — I493 Ventricular premature depolarization: Secondary | ICD-10-CM | POA: Diagnosis not present

## 2018-12-04 DIAGNOSIS — R42 Dizziness and giddiness: Secondary | ICD-10-CM

## 2018-12-21 DIAGNOSIS — R42 Dizziness and giddiness: Secondary | ICD-10-CM | POA: Diagnosis not present

## 2019-01-07 DIAGNOSIS — M25511 Pain in right shoulder: Secondary | ICD-10-CM | POA: Diagnosis not present

## 2019-01-07 DIAGNOSIS — S43081A Other subluxation of right shoulder joint, initial encounter: Secondary | ICD-10-CM | POA: Diagnosis not present

## 2019-01-07 DIAGNOSIS — Z1159 Encounter for screening for other viral diseases: Secondary | ICD-10-CM | POA: Diagnosis not present

## 2019-01-09 DIAGNOSIS — M25511 Pain in right shoulder: Secondary | ICD-10-CM | POA: Diagnosis not present

## 2019-01-17 ENCOUNTER — Ambulatory Visit (INDEPENDENT_AMBULATORY_CARE_PROVIDER_SITE_OTHER): Payer: Medicare HMO

## 2019-01-17 ENCOUNTER — Other Ambulatory Visit: Payer: Self-pay

## 2019-01-17 DIAGNOSIS — R42 Dizziness and giddiness: Secondary | ICD-10-CM | POA: Diagnosis not present

## 2019-01-17 DIAGNOSIS — I10 Essential (primary) hypertension: Secondary | ICD-10-CM | POA: Diagnosis not present

## 2019-01-17 DIAGNOSIS — I517 Cardiomegaly: Secondary | ICD-10-CM

## 2019-01-17 DIAGNOSIS — I493 Ventricular premature depolarization: Secondary | ICD-10-CM

## 2019-01-17 NOTE — Progress Notes (Signed)
Complete echocardiogram has been performed.  Jimmy Brittinee Risk RDCS, RVT 

## 2019-01-30 ENCOUNTER — Encounter: Payer: Self-pay | Admitting: Cardiology

## 2019-01-30 ENCOUNTER — Encounter: Payer: Self-pay | Admitting: *Deleted

## 2019-01-30 ENCOUNTER — Ambulatory Visit (INDEPENDENT_AMBULATORY_CARE_PROVIDER_SITE_OTHER): Payer: Medicare HMO | Admitting: Cardiology

## 2019-01-30 ENCOUNTER — Other Ambulatory Visit: Payer: Self-pay

## 2019-01-30 VITALS — BP 132/64 | HR 68 | Ht 72.0 in | Wt 125.0 lb

## 2019-01-30 DIAGNOSIS — R001 Bradycardia, unspecified: Secondary | ICD-10-CM

## 2019-01-30 DIAGNOSIS — I10 Essential (primary) hypertension: Secondary | ICD-10-CM | POA: Diagnosis not present

## 2019-01-30 DIAGNOSIS — I4729 Other ventricular tachycardia: Secondary | ICD-10-CM

## 2019-01-30 DIAGNOSIS — I493 Ventricular premature depolarization: Secondary | ICD-10-CM

## 2019-01-30 DIAGNOSIS — I472 Ventricular tachycardia, unspecified: Secondary | ICD-10-CM

## 2019-01-30 HISTORY — DX: Ventricular tachycardia: I47.2

## 2019-01-30 HISTORY — DX: Other ventricular tachycardia: I47.29

## 2019-01-30 MED ORDER — METOPROLOL TARTRATE 25 MG PO TABS: 12.5000 mg | ORAL_TABLET | Freq: Two times a day (BID) | ORAL | 3 refills | Status: DC

## 2019-01-30 NOTE — Progress Notes (Signed)
Cardiology Office Note:    Date:  01/30/2019   ID:  James Iha Sr., DOB 1931/08/17, MRN [id number]  PCP:  [name], MD  Cardiologist:  [name], MD    Referring MD: [name],*   Chief Complaint  Patient presents with  . Hypertension    Follow upon testing  Doing well  History of Present Illness:    James Michalak Sr. is a 85 y.o. male was referred to [place] initially because of bradycardia clinically during the examination and evaluation we realized that probably the reason for his bradycardia is frequent ventricular ectopy.  He will monitor which confirmed that.  He did have periods of bigeminy trigeminy as well as short run of nonsustained ventricular tachycardia.  He we have a discussion today about this issue he said he does not get dizzy anymore no chest pain tightness squeezing pressure been chest overall seems to be doing well.  I think there will be well for small dose of beta-blocker which I will initiate.  He is here with his daughter and I explained to her what a potential side effect of this medication.  Reservations that the half is the fact that he does have some asthma.  As a part of evaluation and stratification of this arrhythmia he will schedule him to have a stress test.  Past Medical History:  Diagnosis Date  . Allergic rhinitis   . History of prostate cancer    seed implant  . Hyperlipemia   . Mild asthma   . Rhinosinusitis     Past Surgical History:  Procedure Laterality Date  . APPENDECTOMY      Current Medications: No outpatient medications have been marked as taking for the 01/30/19 encounter (Office Visit) with Park Liter, MD.     Allergies:   Clarithromycin   Social History   Socioeconomic History  . Marital status: Married    Spouse name: Not on file  . Number of children: Not on file  . Years of education: Not on file  . Highest education level: Not on file  Occupational History  .  Occupation: Retired    Fish farm manager: OTHER    Comment: Scientist, research (life sciences)  Social Needs  . Financial resource strain: Not on file  . Food insecurity    Worry: Not on file    Inability: Not on file  . Transportation needs    Medical: Not on file    Non-medical: Not on file  Tobacco Use  . Smoking status: Never Smoker  . Smokeless tobacco: Never Used  Substance and Sexual Activity  . Alcohol use: No  . Drug use: No  . Sexual activity: Not on file  Lifestyle  . Physical activity    Days per week: Not on file    Minutes per session: Not on file  . Stress: Not on file  Relationships  . Social Herbalist on phone: Not on file    Gets together: Not on file    Attends religious service: Not on file    Active member of club or organization: Not on file    Attends meetings of clubs or organizations: Not on file    Relationship status: Not on file  Other Topics Concern  . Not on file  Social History Narrative  . Not on file     Family History: The patient's family history includes Alzheimer's disease in his father; Diabetes in his mother. ROS:   Please see  the history of present illness.    All 14 point review of systems negative except as described per history of present illness  EKGs/Labs/Other Studies Reviewed:      Recent Labs: No results found for requested labs within last 8760 hours.  Recent Lipid Panel No results found for: CHOL, TRIG, HDL, CHOLHDL, VLDL, LDLCALC, LDLDIRECT  Physical Exam:    VS:  BP 132/64 (BP Location: Left Arm, Patient Position: Sitting, Cuff Size: Normal)   Pulse 68   Ht 6' (1.829 m)   Wt 125 lb (56.7 kg)   SpO2 98%   BMI 16.95 kg/m     Wt Readings from Last 3 Encounters:  01/30/19 125 lb (56.7 kg)  11/28/18 128 lb (58.1 kg)  07/18/18 122 lb 3.2 oz (55.4 kg)     GEN:  Well nourished, well developed in no acute distress HEENT: Normal NECK: No JVD; No carotid bruits LYMPHATICS: No lymphadenopathy CARDIAC: RRR, no  murmurs, no rubs, no gallops RESPIRATORY:  Clear to auscultation without rales, wheezing or rhonchi  ABDOMEN: Soft, non-tender, non-distended MUSCULOSKELETAL:  No edema; No deformity  SKIN: Warm and dry LOWER EXTREMITIES: no swelling NEUROLOGIC:  Alert and oriented x 3 PSYCHIATRIC:  Normal affect   ASSESSMENT:    1. Ventricular ectopy   2. Nonsustained ventricular tachycardia ([place])   3. Bradycardia   4. Essential hypertension    PLAN:    In order of problems listed above:  1. Frequent ventricular ectopy with nonsustained ventricular tachycardia plan as outlined above 2. Bradycardia which was caused by frequent ventricular ectopy. 3. Essential hypertension blood pressure appears to be well controlled continue present 4. Supportive stratification of her ventricular arrhythmia stress test will be done.   Medication Adjustments/Labs and Tests Ordered: Current medicines are reviewed at length with the patient today.  Concerns regarding medicines are outlined above.  No orders of the defined types were placed in this encounter.  Medication changes: No orders of the defined types were placed in this encounter.   Signed, Park Liter, MD, [facility] 01/30/2019 3:08 PM    [facility]

## 2019-01-30 NOTE — Addendum Note (Signed)
Addended by: Austin Miles on: 01/30/2019 03:28 PM   Modules accepted: Orders

## 2019-01-30 NOTE — Patient Instructions (Signed)
Medication Instructions:  Your physician has recommended you make the following change in your medication:  START metoprolol tartrate (lopressor) 25 mg: Take 0.5 tablet (12.5 mg) twice daily   If you need a refill on your cardiac medications before your next appointment, please call your pharmacy.   Lab work: None  If you have labs (blood work) drawn today and your tests are completely normal, you will receive your results only by: Marland Kitchen MyChart Message (if you have MyChart) OR . A paper copy in the mail If you have any lab test that is abnormal or we need to change your treatment, we will call you to review the results.  Testing/Procedures: Your physician has requested that you have a lexiscan myoview. For further information please visit [email or web address]. Please follow instruction sheet, as given.  Follow-Up: At [facility], you and your health needs are our priority.  As part of our continuing mission to provide you with exceptional heart care, we have created designated Provider Care Teams.  These Care Teams include your primary Cardiologist (physician) and Advanced Practice Providers (APPs -  Physician Assistants and Nurse Practitioners) who all work together to provide you with the care you need, when you need it. You will need a follow up appointment in 2 months.      Metoprolol tablets What is this medicine? METOPROLOL (me TOE proe lole) is a beta-blocker. Beta-blockers reduce the workload on the heart and help it to beat more regularly. This medicine is used to treat high blood pressure and to prevent chest pain. It is also used to after a heart attack and to prevent an additional heart attack from occurring. This medicine may be used for other purposes; ask your health care provider or pharmacist if you have questions. COMMON BRAND NAME(S): Lopressor What should I tell my health care provider before I take this medicine? They need to know if you have any of these  conditions:  diabetes  heart or vessel disease like slow heart rate, worsening heart failure, heart block, sick sinus syndrome or Raynaud's disease  kidney disease  liver disease  lung or breathing disease, like asthma or emphysema  pheochromocytoma  thyroid disease  an unusual or allergic reaction to metoprolol, other beta-blockers, medicines, foods, dyes, or preservatives  pregnant or trying to get pregnant  breast-feeding How should I use this medicine? Take this medicine by mouth with a drink of water. Follow the directions on the prescription label. Take this medicine immediately after meals. Take your doses at regular intervals. Do not take more medicine than directed. Do not stop taking this medicine suddenly. This could lead to serious heart-related effects. Talk to your pediatrician regarding the use of this medicine in children. Special care may be needed. Overdosage: If you think you have taken too much of this medicine contact a poison control center or emergency room at once. NOTE: This medicine is only for you. Do not share this medicine with others. What if I miss a dose? If you miss a dose, take it as soon as you can. If it is almost time for your next dose, take only that dose. Do not take double or extra doses. What may interact with this medicine? This medicine may interact with the following medications:  certain medicines for blood pressure, heart disease, irregular heart beat  certain medicines for depression like monoamine oxidase (MAO) inhibitors, fluoxetine, or paroxetine  clonidine  dobutamine  epinephrine  isoproterenol  reserpine This list may not describe all possible  interactions. Give your health care provider a list of all the medicines, herbs, non-prescription drugs, or dietary supplements you use. Also tell them if you smoke, drink alcohol, or use illegal drugs. Some items may interact with your medicine. What should I watch for while  using this medicine? Visit your doctor or health care professional for regular check ups. Contact your doctor right away if your symptoms worsen. Check your blood pressure and pulse rate regularly. Ask your health care professional what your blood pressure and pulse rate should be, and when you should contact them. You may get drowsy or dizzy. Do not drive, use machinery, or do anything that needs mental alertness until you know how this medicine affects you. Do not sit or stand up quickly, especially if you are an older patient. This reduces the risk of dizzy or fainting spells. Contact your doctor if these symptoms continue. Alcohol may interfere with the effect of this medicine. Avoid alcoholic drinks. This medicine may increase blood sugar. Ask your healthcare provider if changes in diet or medicines are needed if you have diabetes. What side effects may I notice from receiving this medicine? Side effects that you should report to your doctor or health care professional as soon as possible:  allergic reactions like skin rash, itching or hives  cold or numb hands or feet  depression  difficulty breathing  faint  fever with sore throat  irregular heartbeat, chest pain  rapid weight gain   signs and symptoms of high blood sugar such as being more thirsty or hungry or having to urinate more than normal. You may also feel very tired or have blurry vision.  swollen legs or ankles Side effects that usually do not require medical attention (report to your doctor or health care professional if they continue or are bothersome):  anxiety or nervousness  change in sex drive or performance  dry skin  headache  nightmares or trouble sleeping  short term memory loss  stomach upset or diarrhea This list may not describe all possible side effects. Call your doctor for medical advice about side effects. You may report side effects to FDA at 1-800-FDA-1088. Where should I keep my  medicine? Keep out of the reach of children. Store at room temperature between 15 and 30 degrees C (59 and 86 degrees F). Throw away any unused medicine after the expiration date. NOTE: This sheet is a summary. It may not cover all possible information. If you have questions about this medicine, talk to your doctor, pharmacist, or health care provider.  2020 Elsevier/Gold Standard (2018-02-13 11:15:23)

## 2019-02-07 ENCOUNTER — Telehealth (HOSPITAL_COMMUNITY): Payer: Self-pay | Admitting: *Deleted

## 2019-02-07 NOTE — Telephone Encounter (Signed)
Patient given detailed instructions per Myocardial Perfusion Study Information Sheet for the test on 02/14/19. Patient notified to arrive 15 minutes early and that it is imperative to arrive on time for appointment to keep from having the test rescheduled.  If you need to cancel or reschedule your appointment, please call the office within 24 hours of your appointment. . Patient verbalized understanding. Kirstie Peri

## 2019-02-14 ENCOUNTER — Other Ambulatory Visit: Payer: Self-pay

## 2019-02-14 ENCOUNTER — Ambulatory Visit (INDEPENDENT_AMBULATORY_CARE_PROVIDER_SITE_OTHER): Payer: Medicare HMO

## 2019-02-14 DIAGNOSIS — R9430 Abnormal result of cardiovascular function study, unspecified: Secondary | ICD-10-CM | POA: Diagnosis not present

## 2019-02-14 DIAGNOSIS — I472 Ventricular tachycardia: Secondary | ICD-10-CM

## 2019-02-14 LAB — MYOCARDIAL PERFUSION IMAGING
LV dias vol: 78 mL (ref 62–150)
LV sys vol: 30 mL
Peak HR: 85 {beats}/min
Rest HR: 66 {beats}/min
SDS: 3
SRS: 0
SSS: 3
TID: 0.85

## 2019-02-14 MED ORDER — REGADENOSON 0.4 MG/5ML IV SOLN
0.4000 mg | Freq: Once | INTRAVENOUS | Status: AC
  Administered 2019-02-14: 0.4 mg via INTRAVENOUS

## 2019-02-14 MED ORDER — TECHNETIUM TC 99M TETROFOSMIN IV KIT
10.9000 | PACK | Freq: Once | INTRAVENOUS | Status: AC | PRN
  Administered 2019-02-14: 10.9 via INTRAVENOUS

## 2019-02-14 MED ORDER — TECHNETIUM TC 99M TETROFOSMIN IV KIT
30.5000 | PACK | Freq: Once | INTRAVENOUS | Status: AC | PRN
  Administered 2019-02-14: 30.5 via INTRAVENOUS

## 2019-03-29 DIAGNOSIS — Z1159 Encounter for screening for other viral diseases: Secondary | ICD-10-CM | POA: Diagnosis not present

## 2019-03-29 DIAGNOSIS — J4540 Moderate persistent asthma, uncomplicated: Secondary | ICD-10-CM | POA: Diagnosis not present

## 2019-03-29 DIAGNOSIS — I10 Essential (primary) hypertension: Secondary | ICD-10-CM | POA: Diagnosis not present

## 2019-03-29 DIAGNOSIS — E038 Other specified hypothyroidism: Secondary | ICD-10-CM | POA: Diagnosis not present

## 2019-03-29 DIAGNOSIS — Z681 Body mass index (BMI) 19 or less, adult: Secondary | ICD-10-CM | POA: Diagnosis not present

## 2019-03-29 DIAGNOSIS — Z23 Encounter for immunization: Secondary | ICD-10-CM | POA: Diagnosis not present

## 2019-03-29 DIAGNOSIS — E782 Mixed hyperlipidemia: Secondary | ICD-10-CM | POA: Diagnosis not present

## 2019-03-29 DIAGNOSIS — M545 Low back pain: Secondary | ICD-10-CM | POA: Diagnosis not present

## 2019-03-29 DIAGNOSIS — E440 Moderate protein-calorie malnutrition: Secondary | ICD-10-CM | POA: Diagnosis not present

## 2019-04-01 ENCOUNTER — Ambulatory Visit (INDEPENDENT_AMBULATORY_CARE_PROVIDER_SITE_OTHER): Payer: Medicare HMO | Admitting: Cardiology

## 2019-04-01 ENCOUNTER — Other Ambulatory Visit: Payer: Self-pay

## 2019-04-01 ENCOUNTER — Encounter: Payer: Self-pay | Admitting: Cardiology

## 2019-04-01 VITALS — BP 138/60 | HR 97 | Ht 72.0 in | Wt 122.6 lb

## 2019-04-01 DIAGNOSIS — R001 Bradycardia, unspecified: Secondary | ICD-10-CM

## 2019-04-01 DIAGNOSIS — I472 Ventricular tachycardia: Secondary | ICD-10-CM

## 2019-04-01 DIAGNOSIS — I517 Cardiomegaly: Secondary | ICD-10-CM

## 2019-04-01 DIAGNOSIS — I4729 Other ventricular tachycardia: Secondary | ICD-10-CM

## 2019-04-01 DIAGNOSIS — I10 Essential (primary) hypertension: Secondary | ICD-10-CM

## 2019-04-01 NOTE — Patient Instructions (Signed)
Medication Instructions:  Your physician recommends that you continue on your current medications as directed. Please refer to the Current Medication list given to you today.  *If you need a refill on your cardiac medications before your next appointment, please call your pharmacy*  Lab Work: None.  If you have labs (blood work) drawn today and your tests are completely normal, you will receive your results only by: Marland Kitchen MyChart Message (if you have MyChart) OR . A paper copy in the mail If you have any lab test that is abnormal or we need to change your treatment, we will call you to review the results.  Testing/Procedures: None.   Follow-Up: At [facility], you and your health needs are our priority.  As part of our continuing mission to provide you with exceptional heart care, we have created designated Provider Care Teams.  These Care Teams include your primary Cardiologist (physician) and Advanced Practice Providers (APPs -  Physician Assistants and Nurse Practitioners) who all work together to provide you with the care you need, when you need it.  Your next appointment:   4 month(s)  The format for your next appointment:   In Person  Provider:   [name], MD  Other Instructions

## 2019-04-01 NOTE — Progress Notes (Signed)
Cardiology Office Note:    Date:  04/01/2019   ID:  James Iha Sr., DOB 06-Mar-1932, MRN [id number]  PCP:  [name], MD  Cardiologist:  [name], MD    Referring MD: [name],*   Chief Complaint  Patient presents with  . Follow-up  Doing well  History of Present Illness:    James Bonello Sr. is a 85 y.o. male who was referred originally to me because of bradycardia repeat Holter monitor on we find out that he does have tachyarrhythmia.  He had ventricular tachycardia that being aggressively investigated which include echocardiogram showing preserved ejection fraction, also stress test which showed no evidence of ischemia.  I gave him last time small dose of beta-blocker he feels much better denies having any palpitation that she had before.  Overall he is very happy the way he feels.  No dizziness no passing out  Past Medical History:  Diagnosis Date  . Allergic rhinitis   . History of prostate cancer    seed implant  . Hyperlipemia   . Mild asthma   . Rhinosinusitis     Past Surgical History:  Procedure Laterality Date  . APPENDECTOMY      Current Medications: Current Meds  Medication Sig  . albuterol (VENTOLIN HFA) 108 (90 Base) MCG/ACT inhaler INHALE 2 PUFFS INTO THE LUNGS EVERY 6 HOURS AS NEEDED FOR WHEEZING OR SHORTNESS OF BREATH  . alendronate (FOSAMAX) 70 MG tablet Take 70 mg by mouth once a week. Take with a full glass of water on an empty stomach.  . Fexofenadine HCl (ALLEGRA PO) Take daily as needed by mouth.  Marland Kitchen ipratropium-albuterol (DUONEB) 0.5-2.5 (3) MG/3ML SOLN Take 3 mLs by nebulization every 4 (four) hours as needed.  . metoprolol tartrate (LOPRESSOR) 25 MG tablet Take 0.5 tablets (12.5 mg total) by mouth 2 (two) times daily.  . Multiple Vitamins-Minerals (MULTIVITAMIN ADULT PO) Take by mouth.  . Nutritional Supplements (BOOST NUTRITIONAL ENERGY PO) Take by mouth.  . SYNTHROID 112 MCG tablet Take 1 tablet by  mouth 2 (two) times daily.   Marland Kitchen triamcinolone (NASACORT ALLERGY 24HR) 55 MCG/ACT AERO nasal inhaler Place 1 spray into the nose daily as needed.      Allergies:   Clarithromycin   Social History   Socioeconomic History  . Marital status: Married    Spouse name: Not on file  . Number of children: Not on file  . Years of education: Not on file  . Highest education level: Not on file  Occupational History  . Occupation: Retired    Fish farm manager: OTHER    Comment: Scientist, research (life sciences)  Social Needs  . Financial resource strain: Not on file  . Food insecurity    Worry: Not on file    Inability: Not on file  . Transportation needs    Medical: Not on file    Non-medical: Not on file  Tobacco Use  . Smoking status: Never Smoker  . Smokeless tobacco: Never Used  Substance and Sexual Activity  . Alcohol use: No  . Drug use: No  . Sexual activity: Not on file  Lifestyle  . Physical activity    Days per week: Not on file    Minutes per session: Not on file  . Stress: Not on file  Relationships  . Social Herbalist on phone: Not on file    Gets together: Not on file    Attends religious service: Not on file  Active member of club or organization: Not on file    Attends meetings of clubs or organizations: Not on file    Relationship status: Not on file  Other Topics Concern  . Not on file  Social History Narrative  . Not on file     Family History: The patient's family history includes Alzheimer's disease in his father; Diabetes in his mother. ROS:   Please see the history of present illness.    All 14 point review of systems negative except as described per history of present illness  EKGs/Labs/Other Studies Reviewed:      Recent Labs: No results found for requested labs within last 8760 hours.  Recent Lipid Panel No results found for: CHOL, TRIG, HDL, CHOLHDL, VLDL, LDLCALC, LDLDIRECT  Physical Exam:    VS:  BP 138/60   Pulse 97   Ht 6' (1.829 m)    Wt 122 lb 9.6 oz (55.6 kg)   SpO2 98%   BMI 16.63 kg/m     Wt Readings from Last 3 Encounters:  04/01/19 122 lb 9.6 oz (55.6 kg)  02/14/19 125 lb (56.7 kg)  01/30/19 125 lb (56.7 kg)     GEN:  Well nourished, well developed in no acute distress HEENT: Normal NECK: No JVD; No carotid bruits LYMPHATICS: No lymphadenopathy CARDIAC: RRR, no murmurs, no rubs, no gallops RESPIRATORY:  Clear to auscultation without rales, wheezing or rhonchi  ABDOMEN: Soft, non-tender, non-distended MUSCULOSKELETAL:  No edema; No deformity  SKIN: Warm and dry LOWER EXTREMITIES: no swelling NEUROLOGIC:  Alert and oriented x 3 PSYCHIATRIC:  Normal affect   ASSESSMENT:    1. Nonsustained ventricular tachycardia (HCC)   2. LVH (left ventricular hypertrophy)   3. Essential hypertension   4. Bradycardia    PLAN:    In order of problems listed above:  1. Nonsustained ventricular tachycardia asymptomatic on appropriate medications which I will continue 2. Left ventricle hypertrophy noted 3. Essential hypertension blood pressure well controlled 4. Bradycardia denies having dizziness able to tolerate very small dose of beta-blocker to suppress his ventricular ectopy asymptomatic.   Medication Adjustments/Labs and Tests Ordered: Current medicines are reviewed at length with the patient today.  Concerns regarding medicines are outlined above.  No orders of the defined types were placed in this encounter.  Medication changes: No orders of the defined types were placed in this encounter.   Signed, Park Liter, MD, [facility] 04/01/2019 3:54 PM    [place]

## 2019-05-08 ENCOUNTER — Other Ambulatory Visit: Payer: Self-pay | Admitting: Internal Medicine

## 2019-05-15 DIAGNOSIS — E038 Other specified hypothyroidism: Secondary | ICD-10-CM | POA: Diagnosis not present

## 2019-06-04 ENCOUNTER — Ambulatory Visit: Payer: Medicare HMO

## 2019-06-12 ENCOUNTER — Other Ambulatory Visit: Payer: Self-pay

## 2019-06-12 DIAGNOSIS — E039 Hypothyroidism, unspecified: Secondary | ICD-10-CM

## 2019-06-13 ENCOUNTER — Ambulatory Visit: Payer: Medicare HMO

## 2019-06-15 ENCOUNTER — Ambulatory Visit: Payer: Medicare HMO

## 2019-07-01 ENCOUNTER — Other Ambulatory Visit: Payer: Self-pay

## 2019-07-01 MED ORDER — LEVOTHYROXINE SODIUM 100 MCG PO TABS: 100.0000 ug | ORAL_TABLET | Freq: Two times a day (BID) | ORAL | 0 refills | Status: DC

## 2019-07-04 ENCOUNTER — Other Ambulatory Visit: Payer: Medicare HMO

## 2019-07-04 ENCOUNTER — Other Ambulatory Visit: Payer: Self-pay

## 2019-07-04 DIAGNOSIS — E039 Hypothyroidism, unspecified: Secondary | ICD-10-CM | POA: Diagnosis not present

## 2019-07-05 ENCOUNTER — Other Ambulatory Visit: Payer: Self-pay | Admitting: Legal Medicine

## 2019-07-05 DIAGNOSIS — E039 Hypothyroidism, unspecified: Secondary | ICD-10-CM

## 2019-07-05 LAB — TSH: TSH: 0.023 u[IU]/mL — ABNORMAL LOW (ref 0.450–4.500)

## 2019-07-05 MED ORDER — LEVOTHYROXINE SODIUM 88 MCG PO TABS: 88.0000 ug | ORAL_TABLET | Freq: Every day | ORAL | 3 refills | Status: DC

## 2019-07-05 NOTE — Progress Notes (Signed)
TSH low, means too much thyroid, we should try 30mcg recheck 6 weeks lp

## 2019-07-08 ENCOUNTER — Other Ambulatory Visit: Payer: Self-pay

## 2019-07-08 MED ORDER — LEVOTHYROXINE SODIUM 175 MCG PO TABS: 175.0000 ug | ORAL_TABLET | Freq: Every day | ORAL | 2 refills | Status: DC

## 2019-07-12 DIAGNOSIS — S52501A Unspecified fracture of the lower end of right radius, initial encounter for closed fracture: Secondary | ICD-10-CM | POA: Diagnosis not present

## 2019-07-18 ENCOUNTER — Other Ambulatory Visit: Payer: Self-pay

## 2019-07-18 ENCOUNTER — Ambulatory Visit (INDEPENDENT_AMBULATORY_CARE_PROVIDER_SITE_OTHER): Payer: Medicare HMO | Admitting: Internal Medicine

## 2019-07-18 ENCOUNTER — Encounter: Payer: Self-pay | Admitting: Internal Medicine

## 2019-07-18 DIAGNOSIS — J302 Other seasonal allergic rhinitis: Secondary | ICD-10-CM

## 2019-07-18 DIAGNOSIS — J3089 Other allergic rhinitis: Secondary | ICD-10-CM | POA: Diagnosis not present

## 2019-07-18 DIAGNOSIS — J4520 Mild intermittent asthma, uncomplicated: Secondary | ICD-10-CM

## 2019-07-18 NOTE — Progress Notes (Signed)
Subjective:    Patient ID: [name], male    DOB: Apr 30, 1932, 86 y.o.   MRN: [id number]  HPI  male never smoker followed for allergic rhinitis, mild intermittent asthma, history sinusitis, complicated by history prostate cancer, hyperlipidemia, hypothyroid FENO 08/22/16- 19- WNL Office Spirometry 08/22/16- WNL- FVC 3.65/ 85%, FEV1 2.70-/ 89%, ratio 0.74, FEF 25-75% 2.03/ 98% ------------------------------------------------------------------------------------------   3/21/ 778-  86 year old male never smoker followed for allergic rhinitis, mild asthma, history sinusitis, complicated by history prostate cancer, hyperlipidemia, hypothyroid  -----states breathing is stable, feels lightheaded when standing up in the morning 1-2 times/week, only uses albuterol when needed.                Daughter here Ventolin hfa, Neb Duoneb,  He denies chest pain, palpitation, sputum, adenopathy CXR 07/14/17 1. No active cardiopulmonary disease. No evidence of pneumonia or pulmonary edema. 2. COPD. 3. Aortic atherosclerosis.  07/18/19- 86 year old male never smoker followed for allergic rhinitis, mild asthma, history sinusitis, complicated by history prostate cancer, hyperlipidemia, hypothyroid, HTN, Bradycardia, Dizziness,  Ventolin hfa, Neb Duoneb, Nasacort,                  Son here -----f/u Asthma mild intermittent. Breathing is at patient's baseline Had flu vax and both Phizer covid vax Breathing has been stable, well controlled this winter, using neb or rescue inhaler occasionally wth no sustained exacerbation. Feels well controlled. Slipped on wet curb and fell, fx wrist. Son says prior dizziness has been better and was not a factor.  ROS-see HPI  + = positive Constitutional:   +  weight loss, night sweats, fevers, chills, fatigue, lassitude. HEENT:   No-  headaches, difficulty swallowing, tooth/dental problems, sore throat,       No-  sneezing, itching, ear ache, +nasal congestion, +post  nasal drip,  CV:  No-   chest pain, orthopnea, PND, swelling in lower extremities, anasarca, dizziness, palpitations Resp: + shortness of breath with exertion or at rest.               productive cough,   non-productive cough,  No- coughing up of blood.           change in color of mucus.  No- wheezing.   Skin: No-   rash or lesions. GI:  No-   heartburn, indigestion, abdominal pain, nausea, vomiting,  GU:  MS:  No-   joint pain or swelling.   Neuro-     nothing unusual Psych:  No- change in mood or affect. No depression or anxiety.  No memory loss.  OBJ- Physical Exam General- Alert, Oriented, Affect-appropriate, Distress- none acute. + Thin, talkative Skin- rash-none, lesions- none, excoriation- none Lymphadenopathy- none Head- atraumatic            Eyes- Gross vision intact, PERRLA, conjunctivae and secretions clear            Ears-  + very HOH, + hearing aids            Nose- Clear, no-Septal dev, mucus, polyps, erosion, perforation             Throat- Mallampati II , mucosa -clear, drainage-none seen, tonsils- atrophic, Neck- flexible , trachea midline, no stridor , thyroid nl, carotid no bruit Chest - symmetrical excursion , unlabored           Heart/CV- RRR/ rare skip , no murmur , no gallop  , no rub, nl s1 s2                           -  JVD- none , edema- none, stasis changes- none, varices- none           Lung- +distant/unlabored, wheeze- none , dullness-none, rub- none           Chest wall-  Abd-  Br/ Gen/ Rectal- Not done, not indicated Extrem- + R forearm cast Neuro- grossly intact to observation

## 2019-07-18 NOTE — Assessment & Plan Note (Signed)
Well controlled, uncomplicated Plan- continue current inhalers- refill when needed

## 2019-07-18 NOTE — Patient Instructions (Signed)
We can continue current meds  Please call if we can help 

## 2019-07-18 NOTE — Assessment & Plan Note (Signed)
Flonase and antihistamines have usually been sufficient. Discussed starting Spring pollen season.

## 2019-07-19 DIAGNOSIS — S52501D Unspecified fracture of the lower end of right radius, subsequent encounter for closed fracture with routine healing: Secondary | ICD-10-CM | POA: Diagnosis not present

## 2019-07-26 DIAGNOSIS — S52501D Unspecified fracture of the lower end of right radius, subsequent encounter for closed fracture with routine healing: Secondary | ICD-10-CM | POA: Diagnosis not present

## 2019-07-29 ENCOUNTER — Ambulatory Visit: Payer: Medicare HMO | Admitting: Legal Medicine

## 2019-08-02 ENCOUNTER — Telehealth: Payer: Self-pay | Admitting: Legal Medicine

## 2019-08-02 DIAGNOSIS — R69 Illness, unspecified: Secondary | ICD-10-CM | POA: Diagnosis not present

## 2019-08-02 NOTE — Progress Notes (Signed)
  Chronic Care Management   Outreach Note  08/02/2019 Name: James Waggener Sr. MRN: [id number] DOB: Apr 28, 1932  Referred by: [name], MD Reason for referral : No chief complaint on file.   An unsuccessful telephone outreach was attempted today. The patient was referred to the pharmacist for assistance with care management and care coordination.   Follow Up Plan:   [name] Upstream Scheduler

## 2019-08-06 ENCOUNTER — Other Ambulatory Visit: Payer: Self-pay

## 2019-08-06 ENCOUNTER — Ambulatory Visit (INDEPENDENT_AMBULATORY_CARE_PROVIDER_SITE_OTHER): Payer: Medicare HMO

## 2019-08-06 ENCOUNTER — Encounter: Payer: Self-pay | Admitting: Cardiology

## 2019-08-06 ENCOUNTER — Ambulatory Visit: Payer: Medicare HMO | Admitting: Cardiology

## 2019-08-06 VITALS — BP 124/74 | HR 67 | Ht 72.0 in | Wt 123.0 lb

## 2019-08-06 DIAGNOSIS — R42 Dizziness and giddiness: Secondary | ICD-10-CM

## 2019-08-06 DIAGNOSIS — R001 Bradycardia, unspecified: Secondary | ICD-10-CM | POA: Diagnosis not present

## 2019-08-06 DIAGNOSIS — I517 Cardiomegaly: Secondary | ICD-10-CM

## 2019-08-06 DIAGNOSIS — I10 Essential (primary) hypertension: Secondary | ICD-10-CM

## 2019-08-06 DIAGNOSIS — I472 Ventricular tachycardia: Secondary | ICD-10-CM | POA: Diagnosis not present

## 2019-08-06 DIAGNOSIS — I4729 Other ventricular tachycardia: Secondary | ICD-10-CM

## 2019-08-06 NOTE — Addendum Note (Signed)
Addended by: Truddie Hidden on: 08/06/2019 03:55 PM   Modules accepted: Orders

## 2019-08-06 NOTE — Progress Notes (Signed)
Cardiology Office Note:    Date:  08/06/2019   ID:  James Iha Sr., DOB 17-Aug-1931, MRN [id number]  PCP:  [name], MD  Cardiologist:  [name], MD    Referring MD: [name],*   Chief Complaint  Patient presents with  . Follow-up    4 Months  I fell down 3 weeks ago  History of Present Illness:    James Janusz Sr. is a 86 y.o. male with past medical history significant for bradycardia.  We put monitor on him and surprisingly find out nonsustained ventricular tachycardia.  Likely echocardiogram as well as stress testing after that was normal.  He is doing quite well however 3 weeks ago he was walking the rainy day and he fell down he struck his head as well as his arm.  He ended up finding fracture of his right forearm.  He said that he did not pass out.  Apparently his wife seen him because she was in the car and he is she is seeing him tripping and falling down in the water.  Of course concern is that he got here bradycardia tachycardia.  He denies have any chest pain tightness squeezing pressure burning chest otherwise seems to be doing well.  Past Medical History:  Diagnosis Date  . Allergic rhinitis   . History of prostate cancer    seed implant  . Hyperlipemia   . Mild asthma   . Rhinosinusitis     Past Surgical History:  Procedure Laterality Date  . APPENDECTOMY      Current Medications: Current Meds  Medication Sig  . albuterol (VENTOLIN HFA) 108 (90 Base) MCG/ACT inhaler INHALE 2 PUFFS INTO THE LUNGS EVERY 6 HOURS AS NEEDED FOR WHEEZING OR SHORTNESS OF BREATH  . alendronate (FOSAMAX) 70 MG tablet Take 70 mg by mouth once a week. Take with a full glass of water on an empty stomach.  . Fexofenadine HCl (ALLEGRA PO) Take daily as needed by mouth.  Marland Kitchen ipratropium-albuterol (DUONEB) 0.5-2.5 (3) MG/3ML SOLN Take 3 mLs by nebulization every 4 (four) hours as needed.  Marland Kitchen levothyroxine (SYNTHROID) 175 MCG tablet Take 1 tablet (175  mcg total) by mouth daily before breakfast.  . metoprolol tartrate (LOPRESSOR) 25 MG tablet Take 0.5 tablets (12.5 mg total) by mouth 2 (two) times daily.  . Multiple Vitamins-Minerals (MULTIVITAMIN ADULT PO) Take by mouth.  . Nutritional Supplements (BOOST NUTRITIONAL ENERGY PO) Take by mouth.  . SYNTHROID 88 MCG tablet Take 88 mcg by mouth daily.  Marland Kitchen triamcinolone (NASACORT ALLERGY 24HR) 55 MCG/ACT AERO nasal inhaler Place 1 spray into the nose daily as needed.      Allergies:   Clarithromycin   Social History   Socioeconomic History  . Marital status: Married    Spouse name: Not on file  . Number of children: Not on file  . Years of education: Not on file  . Highest education level: Not on file  Occupational History  . Occupation: Retired    Fish farm manager: OTHER    Comment: Scientist, research (life sciences)  Tobacco Use  . Smoking status: Never Smoker  . Smokeless tobacco: Never Used  Substance and Sexual Activity  . Alcohol use: No  . Drug use: No  . Sexual activity: Not on file  Other Topics Concern  . Not on file  Social History Narrative  . Not on file   Social Determinants of Health   Financial Resource Strain:   . Difficulty of Paying Living Expenses:  Food Insecurity:   . Worried About Charity fundraiser in the Last Year:   . Arboriculturist in the Last Year:   Transportation Needs:   . Film/video editor (Medical):   Marland Kitchen Lack of Transportation (Non-Medical):   Physical Activity:   . Days of Exercise per Week:   . Minutes of Exercise per Session:   Stress:   . Feeling of Stress :   Social Connections:   . Frequency of Communication with Friends and Family:   . Frequency of Social Gatherings with Friends and Family:   . Attends Religious Services:   . Active Member of Clubs or Organizations:   . Attends Archivist Meetings:   Marland Kitchen Marital Status:      Family History: The patient's family history includes Alzheimer's disease in his father; Diabetes in his  mother. ROS:   Please see the history of present illness.    All 14 point review of systems negative except as described per history of present illness  EKGs/Labs/Other Studies Reviewed:      Recent Labs: 07/04/2019: TSH 0.023  Recent Lipid Panel No results found for: CHOL, TRIG, HDL, CHOLHDL, VLDL, LDLCALC, LDLDIRECT  Physical Exam:    VS:  BP 124/74   Pulse 67   Ht 6' (1.829 m)   Wt 123 lb (55.8 kg)   SpO2 97%   BMI 16.68 kg/m     Wt Readings from Last 3 Encounters:  08/06/19 123 lb (55.8 kg)  07/18/19 124 lb (56.2 kg)  04/01/19 122 lb 9.6 oz (55.6 kg)     GEN:  Well nourished, well developed in no acute distress HEENT: Normal NECK: No JVD; No carotid bruits LYMPHATICS: No lymphadenopathy CARDIAC: RRR, no murmurs, no rubs, no gallops RESPIRATORY:  Clear to auscultation without rales, wheezing or rhonchi  ABDOMEN: Soft, non-tender, non-distended MUSCULOSKELETAL:  No edema; No deformity  SKIN: Warm and dry LOWER EXTREMITIES: no swelling NEUROLOGIC:  Alert and oriented x 3 PSYCHIATRIC:  Normal affect   ASSESSMENT:    1. Bradycardia   2. Dizziness   3. Nonsustained ventricular tachycardia ([place])   4. LVH (left ventricular hypertrophy)   5. Essential hypertension    PLAN:    In order of problems listed above:  1. Bradycardia with episode of dizziness.  I will ask him to wear another Zio patchTo make sure he does not have significant bradycardia that is related to dizziness.  Of course we will rule out tachyarrhythmias as well. 2. Nonsustained ventricular tachycardia again monitor will be placed on.  So far he had his echocardiogram and stress test showed no evidence of ischemia echocardiogram showed preserved ejection fraction 3. Presence of left ventricle hypertrophy.  Noted 4. Essential hypertension blood pressure 124/74 today.  Continue present management.  Status post fall of course concern.  We will put monitor on to make sure he does not have any  significant arrhythmias.   Medication Adjustments/Labs and Tests Ordered: Current medicines are reviewed at length with the patient today.  Concerns regarding medicines are outlined above.  No orders of the defined types were placed in this encounter.  Medication changes: No orders of the defined types were placed in this encounter.   Signed, Park Liter, MD, [facility] 08/06/2019 3:45 PM    [place]

## 2019-08-06 NOTE — Patient Instructions (Signed)
Medication Instructions:  No medication changes *If you need a refill on your cardiac medications before your next appointment, please call your pharmacy*   Lab Work: None ordered If you have labs (blood work) drawn today and your tests are completely normal, you will receive your results only by: Marland Kitchen MyChart Message (if you have MyChart) OR . A paper copy in the mail If you have any lab test that is abnormal or we need to change your treatment, we will call you to review the results.   Testing/Procedures:  WHY IS MY DOCTOR PRESCRIBING ZIO? The Zio system is proven and trusted by physicians to detect and diagnose irregular heart rhythms - and has been prescribed to hundreds of thousands of patients.  The FDA has cleared the Zio system to monitor for many different kinds of irregular heart rhythms. In a study, physicians were able to reach a diagnosis 90% of the time with the Zio system1.  You can wear the Zio monitor - a small, discreet, comfortable patch - during your normal day-to-day activity, including while you sleep, shower, and exercise, while it records every single heartbeat for analysis.  1Barrett, P., et al. Comparison of 24 Hour Holter Monitoring Versus 14 Day Novel Adhesive Patch Electrocardiographic Monitoring. [place], 2014.  ZIO VS. HOLTER MONITORING The Zio monitor can be comfortably worn for up to 14 days. Holter monitors can be worn for 24 to 48 hours, limiting the time to record any irregular heart rhythms you may have. Zio is able to capture data for the 51% of patients who have their first symptom-triggered arrhythmia after 48 hours.1  LIVE WITHOUT RESTRICTIONS The Zio ambulatory cardiac monitor is a small, unobtrusive, and water-resistant patch-you might even forget you're wearing it. The Zio monitor records and stores every beat of your heart, whether you're sleeping, working out, or showering.  Wear the monitior for 1 week. Remove  08/13/19.   Follow-Up: At [facility], you and your health needs are our priority.  As part of our continuing mission to provide you with exceptional heart care, we have created designated Provider Care Teams.  These Care Teams include your primary Cardiologist (physician) and Advanced Practice Providers (APPs -  Physician Assistants and Nurse Practitioners) who all work together to provide you with the care you need, when you need it.  We recommend signing up for the patient portal called "MyChart".  Sign up information is provided on this After Visit Summary.  MyChart is used to connect with patients for Virtual Visits (Telemedicine).  Patients are able to view lab/test results, encounter notes, upcoming appointments, etc.  Non-urgent messages can be sent to your provider as well.   To learn more about what you can do with MyChart, go to [email or web address].    Your next appointment:   3 month(s)  The format for your next appointment:   In Person  Provider:   [name], MD   Other Instructions NA

## 2019-08-13 ENCOUNTER — Telehealth: Payer: Self-pay | Admitting: Legal Medicine

## 2019-08-13 NOTE — Progress Notes (Signed)
  Chronic Care Management   Note  08/13/2019 Name: James Hudman Sr. MRN: [id number] DOB: 26-May-1931  James Iha Sr. is a 86 y.o. year old male who is a primary care patient of [name], MD. I reached out to [place]. by phone today in response to a referral sent by [name]'s PCP, [name], MD.   [name] was given information about Chronic Care Management services today including:  1. CCM service includes personalized support from designated clinical staff supervised by his physician, including individualized plan of care and coordination with other care providers 2. 24/7 contact phone numbers for assistance for urgent and routine care needs. 3. Service will only be billed when office clinical staff spend 20 minutes or more in a month to coordinate care. 4. Only one practitioner may furnish and bill the service in a calendar month. 5. The patient may stop CCM services at any time (effective at the end of the month) by phone call to the office staff.   Patient agreed to services and verbal consent obtained.   Follow up plan:   [name] Upstream Scheduler

## 2019-08-15 ENCOUNTER — Other Ambulatory Visit: Payer: Self-pay | Admitting: Legal Medicine

## 2019-08-15 ENCOUNTER — Ambulatory Visit (INDEPENDENT_AMBULATORY_CARE_PROVIDER_SITE_OTHER): Payer: Medicare HMO | Admitting: Legal Medicine

## 2019-08-15 ENCOUNTER — Other Ambulatory Visit: Payer: Self-pay

## 2019-08-15 ENCOUNTER — Encounter: Payer: Self-pay | Admitting: Legal Medicine

## 2019-08-15 VITALS — BP 130/70 | HR 69 | Temp 96.6°F | Resp 16 | Ht 73.0 in | Wt 123.0 lb

## 2019-08-15 DIAGNOSIS — E440 Moderate protein-calorie malnutrition: Secondary | ICD-10-CM

## 2019-08-15 DIAGNOSIS — M150 Primary generalized (osteo)arthritis: Secondary | ICD-10-CM | POA: Diagnosis not present

## 2019-08-15 DIAGNOSIS — E039 Hypothyroidism, unspecified: Secondary | ICD-10-CM

## 2019-08-15 DIAGNOSIS — I10 Essential (primary) hypertension: Secondary | ICD-10-CM

## 2019-08-15 DIAGNOSIS — M810 Age-related osteoporosis without current pathological fracture: Secondary | ICD-10-CM

## 2019-08-15 DIAGNOSIS — H906 Mixed conductive and sensorineural hearing loss, bilateral: Secondary | ICD-10-CM

## 2019-08-15 HISTORY — DX: Age-related osteoporosis without current pathological fracture: M81.0

## 2019-08-15 HISTORY — DX: Moderate protein-calorie malnutrition: E44.0

## 2019-08-15 HISTORY — DX: Mixed conductive and sensorineural hearing loss, bilateral: H90.6

## 2019-08-15 HISTORY — DX: Hypothyroidism, unspecified: E03.9

## 2019-08-15 NOTE — Assessment & Plan Note (Signed)
Patient is known to have hyoithyroidism and is n treatment with levothyroid 128mcg.  Patient was diagnosed 20 years ago.  Other treatment includes none.  Patient is compliant with medicines and last TSH low 6 months ago.  Last TSH was low.

## 2019-08-15 NOTE — Assessment & Plan Note (Signed)

## 2019-08-15 NOTE — Assessment & Plan Note (Signed)
Supplement nutrition with protein/calorie supplement with meals to improve nutritional status. 

## 2019-08-15 NOTE — Assessment & Plan Note (Signed)
AN INDIVIDUAL CARE PLAN for osteoporosiswas established and reinforced today.  The patient's status was assessed using clinical findings on exam, labs, and other diagnostic testing. Patient's success at meeting treatment goals based on disease specific evidence-bassed guidelines and found to be in fair control. RECOMMENDATIONS include maintaining present medicines and treatment. He now has a distal radius fracture from fall on right arm.

## 2019-08-15 NOTE — Assessment & Plan Note (Signed)
AN INDIVIDUAL CARE PLAN  For hearing was established and reinforced today.  The patient's status was assessed using clinical findings on exam, labs, and other diagnostic testing. Patient's success at meeting treatment goals based on disease specific evidence-bassed guidelines and found to be in fair control. RECOMMENDATIONS include maintaining present medicines and treatment.

## 2019-08-15 NOTE — Progress Notes (Signed)
Established Patient Office Visit  Subjective:  Patient ID: James Kirksey Sr., male    DOB: 02-16-1932  Age: 86 y.o. MRN: [id number]  CC:  Chief Complaint  Patient presents with  . Hypothyroidism  . Hypertension    HPI James Iha Sr. presents for chronic visit.  Patient has HYPOTHYROIDISM.  Diagnosed 20 years ago.  Patient has stable thyroid readings.  Patient is having doing well.  Last TSH was 2.5.  continue dosage of thyroid medicine.  Patient presents for follow up of hypertension.  Patient tolerating metoprolol well with side effects.  Patient was diagnosed with hypertension 2010 so has been treated for hypertension for 10 years.Patient is working on maintaining diet and exercise regimen and follows up as directed. Complication include none.  Past Medical History:  Diagnosis Date  . Allergic rhinitis   . History of prostate cancer    seed implant  . Hyperlipemia   . Mild asthma   . Rhinosinusitis     Past Surgical History:  Procedure Laterality Date  . APPENDECTOMY      Family History  Problem Relation Age of Onset  . Alzheimer's disease Father   . Diabetes Mother     Social History   Socioeconomic History  . Marital status: Married    Spouse name: Not on file  . Number of children: Not on file  . Years of education: Not on file  . Highest education level: Not on file  Occupational History  . Occupation: Retired    Fish farm manager: OTHER    Comment: Scientist, research (life sciences)  Tobacco Use  . Smoking status: Former Smoker    Types: Cigarettes    Quit date: 1960    Years since quitting: 61.3  . Smokeless tobacco: Former Systems developer    Types: [place] date: 2011  Substance and Sexual Activity  . Alcohol use: No  . Drug use: No  . Sexual activity: Not Currently  Other Topics Concern  . Not on file  Social History Narrative  . Not on file   Social Determinants of Health   Financial Resource Strain:   . Difficulty of Paying Living Expenses:   Food  Insecurity:   . Worried About Charity fundraiser in the Last Year:   . Arboriculturist in the Last Year:   Transportation Needs:   . Film/video editor (Medical):   Marland Kitchen Lack of Transportation (Non-Medical):   Physical Activity:   . Days of Exercise per Week:   . Minutes of Exercise per Session:   Stress:   . Feeling of Stress :   Social Connections:   . Frequency of Communication with Friends and Family:   . Frequency of Social Gatherings with Friends and Family:   . Attends Religious Services:   . Active Member of Clubs or Organizations:   . Attends Archivist Meetings:   Marland Kitchen Marital Status:   Intimate Partner Violence:   . Fear of Current or Ex-Partner:   . Emotionally Abused:   Marland Kitchen Physically Abused:   . Sexually Abused:     Outpatient Medications Prior to Visit  Medication Sig Dispense Refill  . albuterol (VENTOLIN HFA) 108 (90 Base) MCG/ACT inhaler INHALE 2 PUFFS INTO THE LUNGS EVERY 6 HOURS AS NEEDED FOR WHEEZING OR SHORTNESS OF BREATH 18 g 2  . alendronate (FOSAMAX) 70 MG tablet Take 70 mg by mouth once a week. Take with a full glass of water on an empty stomach.    Marland Kitchen  Fexofenadine HCl (ALLEGRA PO) Take daily as needed by mouth.    Marland Kitchen ipratropium-albuterol (DUONEB) 0.5-2.5 (3) MG/3ML SOLN Take 3 mLs by nebulization every 4 (four) hours as needed.    . Multiple Vitamins-Minerals (MULTIVITAMIN ADULT PO) Take by mouth.    . Nutritional Supplements (BOOST NUTRITIONAL ENERGY PO) Take by mouth.    . triamcinolone (NASACORT ALLERGY 24HR) 55 MCG/ACT AERO nasal inhaler Place 1 spray into the nose daily as needed.     Marland Kitchen levothyroxine (SYNTHROID) 175 MCG tablet Take 1 tablet (175 mcg total) by mouth daily before breakfast. 30 tablet 2  . SYNTHROID 88 MCG tablet Take 88 mcg by mouth daily.    . metoprolol tartrate (LOPRESSOR) 25 MG tablet Take 0.5 tablets (12.5 mg total) by mouth 2 (two) times daily. 60 tablet 3   No facility-administered medications prior to visit.     Allergies  Allergen Reactions  . Clarithromycin     REACTION: GI upset    ROS Review of Systems  Constitutional: Negative.   HENT: Negative.   Eyes: Negative.   Respiratory: Negative.   Cardiovascular: Negative.   Gastrointestinal: Negative.   Endocrine: Negative.   Genitourinary: Negative.   Musculoskeletal: Negative.   Skin: Negative.   Neurological: Negative.   Psychiatric/Behavioral: Negative.       Objective:    Physical Exam  Constitutional: He is oriented to person, place, and time. He appears well-developed and well-nourished.  HENT:  Head: Normocephalic and atraumatic.  Eyes: Pupils are equal, round, and reactive to light. Conjunctivae and EOM are normal.  Cardiovascular: Normal rate, regular rhythm and normal heart sounds.  Pulmonary/Chest: Effort normal and breath sounds normal.  Abdominal: Soft. Bowel sounds are normal.  Musculoskeletal:        General: Normal range of motion.     Cervical back: Normal range of motion and neck supple.     Comments: Cast right forearm  Neurological: He is alert and oriented to person, place, and time. He has normal reflexes.  Skin: Skin is warm and dry.  Psychiatric: He has a normal mood and affect.  Vitals reviewed.   BP 130/70 (BP Location: Left Arm, Patient Position: Sitting)   Pulse 69   Temp (!) 96.6 F (35.9 C) (Temporal)   Resp 16   Ht '6\' 1"'  (1.854 m)   Wt 123 lb (55.8 kg)   SpO2 97%   BMI 16.23 kg/m  Wt Readings from Last 3 Encounters:  08/15/19 123 lb (55.8 kg)  08/06/19 123 lb (55.8 kg)  07/18/19 124 lb (56.2 kg)     Health Maintenance Due  Topic Date Due  . TETANUS/TDAP  Never done    There are no preventive care reminders to display for this patient.  Lab Results  Component Value Date   TSH 0.023 (L) 07/04/2019   No results found for: WBC, HGB, HCT, MCV, PLT No results found for: NA, K, CHLORIDE, CO2, GLUCOSE, BUN, CREATININE, BILITOT, ALKPHOS, AST, ALT, PROT, ALBUMIN, CALCIUM,  ANIONGAP, EGFR, GFR No results found for: CHOL No results found for: HDL No results found for: LDLCALC No results found for: TRIG No results found for: CHOLHDL No results found for: HGBA1C    Assessment & Plan:   Problem List Items Addressed This Visit      Cardiovascular and Mediastinum   Essential hypertension - Primary    An individual hypertension care plan was established and reinforced today.  The patient's status was assessed using clinical findings on exam and labs  or diagnostic tests. The patient's success at meeting treatment goals on disease specific evidence-based guidelines and found to be well controlled. SELF MANAGEMENT: The patient and I together assessed ways to personally work towards obtaining the recommended goals. RECOMMENDATIONS: avoid decongestants found in common cold remedies, decrease consumption of alcohol, perform routine monitoring of BP with home BP cuff, exercise, reduction of dietary salt, take medicines as prescribed, try not to miss doses and quit smoking.  Regular exercise and maintaining a healthy weight is needed.  Stress reduction may help. A CLINICAL SUMMARY including written plan identify barriers to care unique to individual due to social or financial issues.  We attempt to mutually creat solutions for individual and family understanding.      Relevant Orders   CBC with Differential   Comprehensive metabolic panel   Lipid Panel     Endocrine   Acquired hypothyroidism    Patient is known to have hyoithyroidism and is n treatment with levothyroid 123mg.  Patient was diagnosed 20 years ago.  Other treatment includes none.  Patient is compliant with medicines and last TSH low 6 months ago.  Last TSH was low.      Relevant Orders   Lipid Panel   TSH   T4, Free     Nervous and Auditory   Mixed conductive and sensorineural hearing loss, bilateral    AN INDIVIDUAL CARE PLAN  For hearing was established and reinforced today.  The patient's status  was assessed using clinical findings on exam, labs, and other diagnostic testing. Patient's success at meeting treatment goals based on disease specific evidence-bassed guidelines and found to be in fair control. RECOMMENDATIONS include maintaining present medicines and treatment.      Relevant Orders   Lipid Panel     Musculoskeletal and Integument   Senile osteoporosis    AN INDIVIDUAL CARE PLAN for osteoporosiswas established and reinforced today.  The patient's status was assessed using clinical findings on exam, labs, and other diagnostic testing. Patient's success at meeting treatment goals based on disease specific evidence-bassed guidelines and found to be in fair control. RECOMMENDATIONS include maintaining present medicines and treatment. He now has a distal radius fracture from fall on right arm.        Other   Malnutrition of moderate degree ([place]    Supplement nutrition with protein/calorie supplement with meals to improve nutritional status.       Other Visit Diagnoses    Primary generalized (osteo)arthritis       Relevant Orders   Lipid Panel   Age-related osteoporosis without current pathological fracture       Relevant Orders   Lipid Panel   Protein-calorie malnutrition, moderate (HRafael Hernandez       Relevant Orders   Lipid Panel      No orders of the defined types were placed in this encounter.   Follow-up: Return in about 1 month (around 09/14/2019) for fasting.    LReinaldo Meeker MD

## 2019-08-16 LAB — CBC WITH DIFFERENTIAL/PLATELET
Basophils Absolute: 0.1 10*3/uL (ref 0.0–0.2)
Basos: 1 %
EOS (ABSOLUTE): 0.6 10*3/uL — ABNORMAL HIGH (ref 0.0–0.4)
Eos: 7 %
Hematocrit: 34.7 % — ABNORMAL LOW (ref 37.5–51.0)
Hemoglobin: 11.2 g/dL — ABNORMAL LOW (ref 13.0–17.7)
Immature Grans (Abs): 0 10*3/uL (ref 0.0–0.1)
Immature Granulocytes: 0 %
Lymphocytes Absolute: 2.3 10*3/uL (ref 0.7–3.1)
Lymphs: 25 %
MCH: 28.6 pg (ref 26.6–33.0)
MCHC: 32.3 g/dL (ref 31.5–35.7)
MCV: 89 fL (ref 79–97)
Monocytes Absolute: 1.2 10*3/uL — ABNORMAL HIGH (ref 0.1–0.9)
Monocytes: 14 %
Neutrophils Absolute: 4.7 10*3/uL (ref 1.4–7.0)
Neutrophils: 53 %
Platelets: 258 10*3/uL (ref 150–450)
RBC: 3.92 x10E6/uL — ABNORMAL LOW (ref 4.14–5.80)
RDW: 12.7 % (ref 11.6–15.4)
WBC: 8.9 10*3/uL (ref 3.4–10.8)

## 2019-08-16 LAB — COMPREHENSIVE METABOLIC PANEL
ALT: 14 IU/L (ref 0–44)
AST: 20 IU/L (ref 0–40)
Albumin/Globulin Ratio: 1.6 (ref 1.2–2.2)
Albumin: 4.2 g/dL (ref 3.6–4.6)
Alkaline Phosphatase: 61 IU/L (ref 39–117)
BUN/Creatinine Ratio: 16 (ref 10–24)
BUN: 16 mg/dL (ref 8–27)
Bilirubin Total: 0.3 mg/dL (ref 0.0–1.2)
CO2: 21 mmol/L (ref 20–29)
Calcium: 9.4 mg/dL (ref 8.6–10.2)
Chloride: 102 mmol/L (ref 96–106)
Creatinine, Ser: 0.98 mg/dL (ref 0.76–1.27)
GFR calc Af Amer: 81 mL/min/{1.73_m2} (ref >59)
GFR calc non Af Amer: 70 mL/min/{1.73_m2} (ref >59)
Globulin, Total: 2.6 g/dL (ref 1.5–4.5)
Glucose: 96 mg/dL (ref 65–99)
Potassium: 4.7 mmol/L (ref 3.5–5.2)
Sodium: 136 mmol/L (ref 134–144)
Total Protein: 6.8 g/dL (ref 6.0–8.5)

## 2019-08-16 LAB — LIPID PANEL
Chol/HDL Ratio: 3.4 ratio (ref 0.0–5.0)
Cholesterol, Total: 185 mg/dL (ref 100–199)
HDL: 55 mg/dL (ref >39)
LDL Chol Calc (NIH): 115 mg/dL — ABNORMAL HIGH (ref 0–99)
Triglycerides: 84 mg/dL (ref 0–149)
VLDL Cholesterol Cal: 15 mg/dL (ref 5–40)

## 2019-08-16 LAB — TSH: TSH: 0.326 u[IU]/mL — ABNORMAL LOW (ref 0.450–4.500)

## 2019-08-16 LAB — CARDIOVASCULAR RISK ASSESSMENT

## 2019-08-16 LAB — T4, FREE: Free T4: 1.66 ng/dL (ref 0.82–1.77)

## 2019-08-16 NOTE — Progress Notes (Signed)
Hemoglobin low chronic anemia, kidney and liver tests normal, LDL cholesterol 115 up, tsh .326 we  will watch lp

## 2019-08-21 DIAGNOSIS — R001 Bradycardia, unspecified: Secondary | ICD-10-CM | POA: Diagnosis not present

## 2019-08-22 ENCOUNTER — Other Ambulatory Visit: Payer: Self-pay | Admitting: Legal Medicine

## 2019-08-22 ENCOUNTER — Telehealth: Payer: Self-pay

## 2019-08-22 ENCOUNTER — Other Ambulatory Visit: Payer: Self-pay | Admitting: Cardiology

## 2019-08-22 DIAGNOSIS — W57XXXA Bitten or stung by nonvenomous insect and other nonvenomous arthropods, initial encounter: Secondary | ICD-10-CM

## 2019-08-22 MED ORDER — DOXYCYCLINE HYCLATE 100 MG PO TABS: 100.0000 mg | ORAL_TABLET | Freq: Two times a day (BID) | ORAL | 0 refills | Status: DC

## 2019-08-22 NOTE — Telephone Encounter (Signed)
Patient's wife called in stating that patient has pulled off a lot of ticks and has been itching, would like something called in. Wife said he usually gets doxicycline

## 2019-08-23 DIAGNOSIS — S52501D Unspecified fracture of the lower end of right radius, subsequent encounter for closed fracture with routine healing: Secondary | ICD-10-CM | POA: Diagnosis not present

## 2019-08-27 ENCOUNTER — Telehealth: Payer: Medicare HMO

## 2019-09-03 ENCOUNTER — Telehealth: Payer: Self-pay | Admitting: Cardiology

## 2019-09-03 NOTE — Telephone Encounter (Signed)
Left message on patients voicemail to please return our call.   

## 2019-09-03 NOTE — Telephone Encounter (Signed)
New Message  Patient is calling back for results  Please call

## 2019-09-03 NOTE — Telephone Encounter (Signed)
  Patients daughter [name] is calling to see if someone can go over her fathers event monitor results with her.

## 2019-09-03 NOTE — Chronic Care Management (AMB) (Signed)
 Chronic Care Management Pharmacy  Name: James Vernon Cuen Sr.  MRN: [id number] DOB: 09/27/1931  Chief Complaint/ HPI  James Vernon Vitolo Sr.,  86 y.o. , male presents for their Initial CCM visit with the clinical pharmacist via telephone due to COVID-19 Pandemic.  PCP : Perry, Lawrence Edward, MD  Their chronic conditions include: HTN, Hypothyroidism, Osteoporosis, HLD, Allergies, Asthma.   Office Visits: 08/22/2019 - patient has had a lot of ticks lately and has itching. Requested a doxycycline.  08/15/2019 - Hemoglobin low chronic anemia, kidney and liver tests normal, LDL cholesterol 115 up, tsh .326  07/05/2019 - TSH low, means too much thyroid, we should try 88mcg recheck 6 weeks 03/29/2019 - flu shot. Labs drawn. Decreased levothyroxine to 100 mcg since TSH too high. Mild anemia, kidney and liver ok.   Consult Visit: 08/06/2019 - Bradycardia with episode of dizziness.  I will ask him to wear another Zio patchTo make sure he does not have significant bradycardia that is related to dizziness.  07/18/2019 - Pulmonology - Dr. Young. No med changes.  04/01/2019 - Cardiology - No med changes.   Medications: Outpatient Encounter Medications as of 09/04/2019  Medication Sig  . albuterol (VENTOLIN HFA) 108 (90 Base) MCG/ACT inhaler INHALE 2 PUFFS INTO THE LUNGS EVERY 6 HOURS AS NEEDED FOR WHEEZING OR SHORTNESS OF BREATH  . alendronate (FOSAMAX) 70 MG tablet Take 70 mg by mouth once a week. Take with a full glass of water on an empty stomach.  . Fexofenadine HCl (ALLEGRA PO) Take daily as needed by mouth.  . ipratropium-albuterol (DUONEB) 0.5-2.5 (3) MG/3ML SOLN Take 3 mLs by nebulization every 4 (four) hours as needed.  . metoprolol tartrate (LOPRESSOR) 25 MG tablet TAKE 1/2 TABLET BY MOUTH TWICE A DAY  . Multiple Vitamins-Minerals (MULTIVITAMIN ADULT PO) Take by mouth.  . Nutritional Supplements (BOOST NUTRITIONAL ENERGY PO) Take by mouth. Drinking 1-2 per day  . SYNTHROID 175 MCG  tablet TAKE 1 TABLET BY MOUTH EVERY DAY BEFORE BREAKFAST  . triamcinolone (NASACORT ALLERGY 24HR) 55 MCG/ACT AERO nasal inhaler Place 1 spray into the nose daily as needed.   . doxycycline (VIBRA-TABS) 100 MG tablet Take 1 tablet (100 mg total) by mouth 2 (two) times daily. (Patient not taking: Reported on 09/04/2019)   No facility-administered encounter medications on file as of 09/04/2019.   Allergies  Allergen Reactions  . Clarithromycin     REACTION: GI upset   SDOH Screenings   Alcohol Screen:   . Last Alcohol Screening Score (AUDIT):   Depression (PHQ2-9): Low Risk   . PHQ-2 Score: 0  Financial Resource Strain:   . Difficulty of Paying Living Expenses:   Food Insecurity:   . Worried About Running Out of Food in the Last Year:   . Ran Out of Food in the Last Year:   Housing: Low Risk   . Last Housing Risk Score: 0  Physical Activity: Sufficiently Active  . Days of Exercise per Week: 6 days  . Minutes of Exercise per Session: 30 min  Social Connections:   . Frequency of Communication with Friends and Family:   . Frequency of Social Gatherings with Friends and Family:   . Attends Religious Services:   . Active Member of Clubs or Organizations:   . Attends Club or Organization Meetings:   . Marital Status:   Stress:   . Feeling of Stress :   Tobacco Use: Medium Risk  . Smoking Tobacco Use: Former Smoker  . Smokeless Tobacco   Use: Former User  Transportation Needs: No Transportation Needs  . Lack of Transportation (Medical): No  . Lack of Transportation (Non-Medical): No     Current Diagnosis/Assessment:  Goals Addressed            This Visit's Progress   . Pharmacy Care Plan - HLD       CARE PLAN ENTRY (see longitudinal plan of care for additional care plan information)  Current Barriers:  . Uncontrolled hyperlipidemia, complicated by age . Current antihyperlipidemic regimen: diet/exercise . Previous antihyperlipidemic medications tried simvastatin 40  mg . Most recent lipid panel:     Component Value Date/Time   CHOL 185 08/15/2019 0959   TRIG 84 08/15/2019 0959   HDL 55 08/15/2019 0959   CHOLHDL 3.4 08/15/2019 0959   LDLCALC 115 (H) 08/15/2019 0959 .   . ASCVD risk enhancing conditions: age >65, DM, HTN, CKD, CHF, current smoker   Pharmacist Clinical Goal(s):  . Over the next 90 days, patient will work with PharmD and providers towards optimized antihyperlipidemic therapy  Interventions: . Comprehensive medication review performed; medication list updated in electronic medical record.  . Inter-disciplinary care team collaboration (see longitudinal plan of care)   Patient Self Care Activities:  . Patient will focus on lifestyle modifications of a good diet and remaining active daily  Initial goal documentation     . Pharmacy Care Plan - HTN       CARE PLAN ENTRY (see longitudinal plan of care for additional care plan information)  Current Barriers:  . Uncontrolled hypertension, complicated by irregular heartbeat . Current antihypertensive regimen: metoprolol tartrate 12.5 mg bid . Previous antihypertensives tried: n/a . Last practice recorded BP readings:  BP Readings from Last 3 Encounters:  08/15/19 130/70  08/06/19 124/74  07/18/19 110/68 .   . Current home BP readings: at goal . Most recent eGFR/CrCl: No results found for: EGFR  No components found for: CRCL  Pharmacist Clinical Goal(s):  . Over the next 90 days, patient will work with PharmD and providers to optimize antihypertensive regimen . Ensure safety, efficacy, and affordability of medications .   Interventions: . Inter-disciplinary care team collaboration (see longitudinal plan of care) . Comprehensive medication review performed; medication list updated in the electronic medical record.   Patient Self Care Activities:  . Patient will continue to check BP a few times each week , document, and provide at future appointments . Patient will focus on  medication adherence by continuing to keep organized and take as directed . Talk to pharmacy about cutting metoprolol in half.   Initial goal documentation        Allergies/Intermittent Asthma   Eosinophil count:  No results found for: EOSPCT%                               Eos (Absolute):  Lab Results  Component Value Date/Time   EOSABS 0.6 (H) 08/15/2019 09:59 AM    Tobacco Status:  Social History   Tobacco Use  Smoking Status Former Smoker  . Types: Cigarettes  . Quit date: 1960  . Years since quitting: 61.3  Smokeless Tobacco Former User  . Types: Chew  . Quit date: 2011    Patient has failed these meds in past: Symbicort, Flonase, Trelegy Ellipta, Breo Ellipta, Xopenex nebulizer solution, loratadine, nasonex,  Patient is currently controlled on the following medications: albuterol prn, fexofenadine 180 mg daily prn, duoneb prn, nasacort nasal spray prn   Using maintenance inhaler regularly? No Frequency of rescue inhaler use:  1-2x per week  We discussed:  Patient is well managed on daily allegra. Patient's pulmonologist monitors. He rarely uses prn duoneb nebulizer solution. He does use albuterol inhaler as needed.   Plan  Continue current medications   Hypertension   BP today is:  <140/90  Office blood pressures are  BP Readings from Last 3 Encounters:  08/15/19 130/70  08/06/19 124/74  07/18/19 110/68   Patient has failed these meds in the past: n/a Patient is currently controlledon the following medications: metoprolol tartrate 12.5 mg bid  Patient checks BP at home weekly  Patient home BP readings are ranging: 120/70  We discussed diet and exercise extensively Zio patch results today - no change.Patient remains active every day around his home. He eats well but can't maintain/gain weight well so supplementing with Boost for additional protein/calories. Patient has days where he doesn't feel well. Pharmacist requested that he check his blood  pressure/pulse and document how he was filling.   Plan  Continue current medications   Hyperlipidemia   Lipid Panel     Component Value Date/Time   CHOL 185 08/15/2019 0959   TRIG 84 08/15/2019 0959   HDL 55 08/15/2019 0959   CHOLHDL 3.4 08/15/2019 0959   LDLCALC 115 (H) 08/15/2019 0959   LABVLDL 15 08/15/2019 0959     The ASCVD Risk score (Goff DC Jr., et al., 2013) failed to calculate for the following reasons:   The 2013 ASCVD risk score is only valid for ages 45 to 63   Patient has failed these meds in past: simvastatin 40 mg daily Patient is currently controlled on the following medications: n/a  We discussed:  diet and exercise extensively. Patient's LDL was slightly above goal at last labs. Patient continues to remain active outdoors and eating well.   Plan  Continue current medications  Hypothyroidism   TSH  Date Value Ref Range Status  08/15/2019 0.326 (L) 0.450 - 4.500 uIU/mL Final   Patient has failed these meds in past: n/a Patient is currently uncontrolled on the following medications: synthroid 175 mcg daily  We discussed:  Patient has had lots of fluctuations with TSH in the past year. He is consistently taking brand name Synthroid on an empty stomach at least 30 mins prior to all other food or medication.   Plan  Continue current medications   Osteopenia / Osteoporosis   Last DEXA Scan: March 2019 - statistically significant decrease from previous scan in 2016. Patient's t-score was -2.9 and was considered osteoporotic.   Patient is a candidate for pharmacologic treatment due to T-Score < -2.5 in femoral neck  Patient has failed these meds in past: oscal 600 mg Patient is currently controlled on the following medications: alendronate 70 mg weekly  We discussed:  Counseled on oral bisphosphonate administration: take in the morning, 30 minutes prior to food with 6-8 oz of water. Do not lie down for at least 30 minutes after taking. Takes on  Fridays. Takes on empty stomach. Patient broke his arm March 2021 and healed quickly.  Plan  Continue current medications   Medication Management   Pt uses [facility] for all medications Uses pill box? Yes - has one but uses the  Pt endorses excellent compliance  We discussed: Patient is taking medications well and leaving them out on dining room table.   Plan  Continue current medication management strategy    Follow up:  in August for 3 month phone visit   Vaccines   Reviewed and discussed patient's vaccination history.  Immunization History  Administered Date(s) Administered  . Influenza Split 02/06/2013, 02/18/2015  . Influenza Whole 02/07/2011, 03/15/2012  . Influenza, High Dose Seasonal PF 02/01/2017, 01/17/2018  . Influenza,inj,Quad PF,6+ Mos 01/07/2014  . PFIZER SARS-COV-2 Vaccination 06/10/2019, 07/01/2019  . Pneumococcal Conjugate-13 12/31/2014  . Pneumococcal Polysaccharide-23 02/07/2011    Plan  Recommended patient receive annual flu vaccine in office.   Health Maintenance   Patient is currently controlled on the following medications: Boost nutritional twice daily, Multivitamin daily  We discussed:  Patient is working to maintain weight with boost.   Plan  Continue current medications

## 2019-09-04 ENCOUNTER — Other Ambulatory Visit: Payer: Self-pay

## 2019-09-04 ENCOUNTER — Ambulatory Visit: Payer: Medicare HMO

## 2019-09-04 DIAGNOSIS — E782 Mixed hyperlipidemia: Secondary | ICD-10-CM

## 2019-09-04 DIAGNOSIS — I10 Essential (primary) hypertension: Secondary | ICD-10-CM

## 2019-09-04 NOTE — Telephone Encounter (Signed)
Left message on patients voicemail to please return our call.   

## 2019-09-04 NOTE — Telephone Encounter (Signed)
Follow Up  Patient's daughter is returning call for results. Please give a call back.

## 2019-09-04 NOTE — Patient Instructions (Addendum)
Visit Information  Thank you for your time discussing your medications. I look forward to working with you to achieve your health care goals. Below is a summary of what we talked about during our visit.   Goals Addressed            This Visit's Progress   . Pharmacy Care Plan - HLD       CARE PLAN ENTRY (see longitudinal plan of care for additional care plan information)  Current Barriers:  . Uncontrolled hyperlipidemia, complicated by age . Current antihyperlipidemic regimen: diet/exercise . Previous antihyperlipidemic medications tried simvastatin 40 mg . Most recent lipid panel:     Component Value Date/Time   CHOL 185 08/15/2019 0959   TRIG 84 08/15/2019 0959   HDL 55 08/15/2019 0959   CHOLHDL 3.4 08/15/2019 0959   LDLCALC 115 (H) 08/15/2019 0959 .   Marland Kitchen ASCVD risk enhancing conditions: age >65, DM, HTN, CKD, CHF, current smoker   Pharmacist Clinical Goal(s):  Marland Kitchen Over the next 90 days, patient will work with PharmD and providers towards optimized antihyperlipidemic therapy  Interventions: . Comprehensive medication review performed; medication list updated in electronic medical record.  Bertram Savin care team collaboration (see longitudinal plan of care)   Patient Self Care Activities:  . Patient will focus on lifestyle modifications of a good diet and remaining active daily  Initial goal documentation     . Pharmacy Care Plan - HTN       CARE PLAN ENTRY (see longitudinal plan of care for additional care plan information)  Current Barriers:  . Uncontrolled hypertension, complicated by irregular heartbeat . Current antihypertensive regimen: metoprolol tartrate 12.5 mg bid . Previous antihypertensives tried: n/a . Last practice recorded BP readings:  BP Readings from Last 3 Encounters:  08/15/19 130/70  08/06/19 124/74  07/18/19 110/68 .   Marland Kitchen Current home BP readings: at goal . Most recent eGFR/CrCl: No results found for: EGFR  No components found for:  CRCL  Pharmacist Clinical Goal(s):  Marland Kitchen Over the next 90 days, patient will work with PharmD and providers to optimize antihypertensive regimen . Ensure safety, efficacy, and affordability of medications .   Interventions: . Inter-disciplinary care team collaboration (see longitudinal plan of care) . Comprehensive medication review performed; medication list updated in the electronic medical record.   Patient Self Care Activities:  . Patient will continue to check BP a few times each week , document, and provide at future appointments . Patient will focus on medication adherence by continuing to keep organized and take as directed . Talk to pharmacy about cutting metoprolol in half.   Initial goal documentation        Mr. Pintor was given information about Chronic Care Management services today including:  1. CCM service includes personalized support from designated clinical staff supervised by his physician, including individualized plan of care and coordination with other care providers 2. 24/7 contact phone numbers for assistance for urgent and routine care needs. 3. Standard insurance, coinsurance, copays and deductibles apply for chronic care management only during months in which we provide at least 20 minutes of these services. Most insurances cover these services at 100%, however patients may be responsible for any copay, coinsurance and/or deductible if applicable. This service may help you avoid the need for more expensive face-to-face services. 4. Only one practitioner may furnish and bill the service in a calendar month. 5. The patient may stop CCM services at any time (effective at the end of the month)  by phone call to the office staff.  Patient agreed to services and verbal consent obtained.   The patient verbalized understanding of instructions provided today and agreed to receive a mailed copy of patient instruction and/or educational materials. Telephone follow up  appointment with pharmacy team member scheduled for: August 2021  Sherre Poot, PharmD Clinical Pharmacist [facility] [phone]   Eating Plan for Osteoporosis Osteoporosis causes your bones to become weak and brittle. This puts you at greater risk for bone breaks (fractures) from small bumps or falls. Making changes to your diet and increasing your physical activity can help strengthen your bones and improve your overall health. Calcium and vitamin D are nutrients that play an important role in bone health. Vitamin D helps your body use calcium and strengthen bones. Therefore, it is important to get enough calcium and vitamin D as part of your eating plan for osteoporosis. What are tips for following this plan? Reading food labels  Try to get at least 1,000 milligrams (mg) of calcium each day.  Look for foods that have at least 50 mg of calcium per serving.  Talk with your health care provider about taking a calcium supplement if you do not get enough calcium from food.  Do not have more than 2,500 mg of calcium each day. This is the upper limit for food and nutritional supplements combined. Too much calcium may cause constipation and prevent you from absorbing other important nutrients.  Choose foods that contain vitamin D.  Take a daily vitamin supplement that contains 800-1,000 international units (IU) of vitamin D. The amount may be different depending on your age, body weight, ethnicity, and where you live. Talk with your dietitian or health care provider about how much vitamin D is right for you.  Avoid foods that have more than 300 mg of sodium per serving. Too much sodium can cause your body to lose calcium.  Talk with your dietitian or health care provider about how much sodium you are allowed each day. Shopping  Do not buy foods with added salt, including: ? Salted snacks. ? Angie Fava. ? Canned soups. ? Canned meats. ? Processed meats, such as bacon or cold  cuts. ? Smoked fish. Meal planning  Eat balanced meals that contain protein foods, fruits and vegetables, and foods rich in calcium and vitamin D.  Eat at least 5 servings of fruits and vegetables each day.  Eat 5-6 oz. of lean meat, poultry, fish, eggs, or beans each day. Lifestyle  Do not use any products that contain nicotine or tobacco, such as cigarettes and e-cigarettes. If you need help quitting, ask your health care provider.  If your health care provider recommends that you lose weight: ? Work with a dietitian to develop an eating plan that will help you reach your desired weight goal. ? Exercise for at least 30 minutes a day, 5 or more days a week, or as told by your health care provider.  Work with a physical therapist to develop an exercise plan that includes flexibility, balance, and strength exercises.  If you drink alcohol, limit how much you have. This means: ? 0-1 drink a day for women. ? 0-2 drinks a day for men. ? Be aware of how much alcohol is in your drink. In the [place] one drink equals one typical bottle of beer (12 oz), one-half glass of wine (5 oz), or one shot of hard liquor (1 oz). What foods should I eat? Foods high in calcium  Yogurt. Yogurt with fruit.  Milk. Evaporated skim milk. Dry milk powder.  Calcium-fortified orange juice.  Parmesan cheese. Part-skim ricotta cheese. Natural hard cheese. Cream cheese. Cottage cheese.  Canned sardines. Canned salmon.  Calcium-treated tofu. Calcium-fortified cereal bar. Calcium-fortified cereal. Calcium-fortified graham crackers.  Cooked collard greens. Turnip greens. Broccoli. Kale.  Almonds.  White beans.  Corn tortilla. Foods high in vitamin D  Cod liver oil. Fatty fish, such as tuna, mackerel, and salmon.  Milk. Fortified soy milk. Fortified fruit juice.  Yogurt. Margarine.  Egg yolks. Foods high in protein  Beef. Lamb. Pork tenderloin.  Chicken breast.  Tuna (canned). Fish  fillet.  Tofu.  Soy beans (cooked). Soy patty. Beans (canned or cooked).  Cottage cheese.  Yogurt.  Peanut butter.  Pumpkin seeds. Nuts. Sunflower seeds.  Hard cheese.  Milk or other milk products, such as soy milk. The items listed above may not be a complete list of foods and beverages you can eat. Contact a dietitian for more options. Summary  Calcium and vitamin D are nutrients that play an important role in bone health and are an important part of your eating plan for osteoporosis.  Eat balanced meals that contain protein foods, fruits and vegetables, and foods rich in calcium and vitamin D.  Avoid foods that have more than 300 mg of sodium per serving. Too much sodium can cause your body to lose calcium.  Exercise is an important part of prevention and treatment of osteoporosis. Aim for at least 30 minutes a day, 5 days a week. This information is not intended to replace advice given to you by your health care provider. Make sure you discuss any questions you have with your health care provider. Document Revised: 07/03/2017 Document Reviewed: 07/03/2017 Elsevier Patient Education  2020 [employer].

## 2019-09-04 NOTE — Telephone Encounter (Signed)
Spoke with patient regarding results.  Patient verbalizes understanding and is agreeable to plan of care. Advised patient to call back with any issues or concerns.  

## 2019-09-25 DIAGNOSIS — S52501D Unspecified fracture of the lower end of right radius, subsequent encounter for closed fracture with routine healing: Secondary | ICD-10-CM | POA: Diagnosis not present

## 2019-10-03 DIAGNOSIS — R69 Illness, unspecified: Secondary | ICD-10-CM | POA: Diagnosis not present

## 2019-10-21 ENCOUNTER — Other Ambulatory Visit: Payer: Self-pay | Admitting: Legal Medicine

## 2019-10-22 ENCOUNTER — Ambulatory Visit: Payer: Self-pay

## 2019-10-22 DIAGNOSIS — I10 Essential (primary) hypertension: Secondary | ICD-10-CM

## 2019-10-22 DIAGNOSIS — E782 Mixed hyperlipidemia: Secondary | ICD-10-CM

## 2019-10-22 NOTE — Chronic Care Management (AMB) (Signed)
Chronic Care Management Pharmacy  Name: James Slape Sr.  MRN: [id number] DOB: 25-Apr-1932  Chief Complaint/ HPI  James Iha Sr.,  86 y.o. , male presents for their Initial CCM visit with the clinical pharmacist via telephone due to COVID-19 Pandemic.  PCP : [name], MD  Their chronic conditions include: HTN, Hypothyroidism, Osteoporosis, HLD, Allergies, Asthma.   Office Visits: 08/22/2019 - patient has had a lot of ticks lately and has itching. Requested a doxycycline.  08/15/2019 - Hemoglobin low chronic anemia, kidney and liver tests normal, LDL cholesterol 115 up, tsh .326  07/05/2019 - TSH low, means too much thyroid, we should try 87mcg recheck 6 weeks 03/29/2019 - flu shot. Labs drawn. Decreased levothyroxine to 100 mcg since TSH too high. Mild anemia, kidney and liver ok.   Consult Visit: 08/06/2019 - Bradycardia with episode of dizziness.  I will ask him to wear another Zio patchTo make sure he does not have significant bradycardia that is related to dizziness.  07/18/2019 - Pulmonology - Dr. Annamaria Boots. No med changes.  04/01/2019 - Cardiology - No med changes.   Medications: Outpatient Encounter Medications as of 10/22/2019  Medication Sig  . albuterol (VENTOLIN HFA) 108 (90 Base) MCG/ACT inhaler INHALE 2 PUFFS INTO THE LUNGS EVERY 6 HOURS AS NEEDED FOR WHEEZING OR SHORTNESS OF BREATH  . alendronate (FOSAMAX) 70 MG tablet TAKE 1 TABLET BY MOUTH ONCE WEEKLY IN THE MORNING, AT LEAST 30 MIN BEFORE FIRST FOOD, BEVERAGE, OR MEDICATION OF DAY  . doxycycline (VIBRA-TABS) 100 MG tablet Take 1 tablet (100 mg total) by mouth 2 (two) times daily. (Patient not taking: Reported on 09/04/2019)  . Fexofenadine HCl (ALLEGRA PO) Take daily as needed by mouth.  Marland Kitchen ipratropium-albuterol (DUONEB) 0.5-2.5 (3) MG/3ML SOLN Take 3 mLs by nebulization every 4 (four) hours as needed.  . metoprolol tartrate (LOPRESSOR) 25 MG tablet TAKE 1/2 TABLET BY MOUTH TWICE A DAY  . Multiple  Vitamins-Minerals (MULTIVITAMIN ADULT PO) Take by mouth.  . Nutritional Supplements (BOOST NUTRITIONAL ENERGY PO) Take by mouth. Drinking 1-2 per day  . SYNTHROID 175 MCG tablet TAKE 1 TABLET BY MOUTH EVERY DAY BEFORE BREAKFAST  . triamcinolone (NASACORT ALLERGY 24HR) 55 MCG/ACT AERO nasal inhaler Place 1 spray into the nose daily as needed.    No facility-administered encounter medications on file as of 10/22/2019.   Allergies  Allergen Reactions  . Clarithromycin     REACTION: GI upset   SDOH Screenings   Alcohol Screen:   . Last Alcohol Screening Score (AUDIT):   Depression (PHQ2-9): Low Risk   . PHQ-2 Score: 0  Financial Resource Strain:   . Difficulty of Paying Living Expenses:   Food Insecurity:   . Worried About Charity fundraiser in the Last Year:   . [employer] of Food in the Last Year:   Housing: [place]   . Last Housing Risk Score: 0  Physical Activity: Sufficiently Active  . Days of Exercise per Week: 6 days  . Minutes of Exercise per Session: 30 min  Social Connections:   . Frequency of Communication with Friends and Family:   . Frequency of Social Gatherings with Friends and Family:   . Attends Religious Services:   . Active Member of Clubs or Organizations:   . Attends Archivist Meetings:   Marland Kitchen Marital Status:   Stress:   . Feeling of Stress :   Tobacco Use: Medium Risk  . Smoking Tobacco Use: Former Smoker  .  Smokeless Tobacco Use: Former Soil scientist Needs: No Transportation Needs  . Lack of Transportation (Medical): No  . Lack of Transportation (Non-Medical): No     Current Diagnosis/Assessment:  Goals Addressed            This Visit's Progress   . Pharmacy Care Plan       CARE PLAN ENTRY  Current Barriers:  . Chronic Disease Management support, education, and care coordination needs related to Hypertension and Hypothyroidism   Hypertension . Pharmacist Clinical Goal(s): o Over the next 90 days, patient will work with  PharmD and providers to maintain BP goal <140/90 . Current regimen:  o Metoprolol 12.5 mg bid . Interventions: o Recommend patient take metoprolol tartrate twice daily on a regular dosing schedule.  o Recommend patient utilize chart from daughter to stay on track with consistent dosing.  . Patient self care activities - Over the next 90 days, patient will: o Check BP weekly, document, and provide at future appointments o Ensure daily salt intake < 2300 mg/day  Hypothyroidism . Pharmacist Clinical Goal(s): o Over the next 90 days, patient will work with PharmD and providers to achieve in range TSH  . Current regimen:  o Synthroid 175 mcg daily . Interventions: o Recommend patient take thyroid medication consistently each day at same time.  . Patient self care activities - Over the next 90 days, patient will: o Continue taking Synthroid daily each am 30 minutes prior to food.  Medication management . Pharmacist Clinical Goal(s): o Over the next 90 days, patient will work with PharmD and providers to maintain optimal medication adherence . [facility] . Interventions o Comprehensive medication review performed. o Continue current medication management strategy . Patient self care activities - Over the next 90 days, patient will: o Focus on medication adherence by using pill box and chart o Take medications as prescribed o Report any questions or concerns to PharmD and/or provider(s)  Please see past updates related to this goal by clicking on the "Past Updates" button in the selected goal      . COMPLETED: [place] (see longitudinal plan of care for additional care plan information)  Current Barriers:  . Uncontrolled hyperlipidemia, complicated by age . Current antihyperlipidemic regimen: diet/exercise . Previous antihyperlipidemic medications tried simvastatin 40 mg . Most recent lipid panel:     Component Value Date/Time    CHOL 185 08/15/2019 0959   TRIG 84 08/15/2019 0959   HDL 55 08/15/2019 0959   CHOLHDL 3.4 08/15/2019 0959   LDLCALC 115 (H) 08/15/2019 0959 .   Marland Kitchen ASCVD risk enhancing conditions: age >62, DM, HTN, CKD, CHF, current smoker   Pharmacist Clinical Goal(s):  Marland Kitchen Over the next 90 days, patient will work with PharmD and providers towards optimized antihyperlipidemic therapy  Interventions: . Comprehensive medication review performed; medication list updated in electronic medical record.  Bertram Savin care team collaboration (see longitudinal plan of care)   Patient Self Care Activities:  . Patient will focus on lifestyle modifications of a good diet and remaining active daily  Initial goal documentation          Hypertension   BP today is:  <140/90  Office blood pressures are  BP Readings from Last 3 Encounters:  08/15/19 130/70  08/06/19 124/74  07/18/19 110/68   Patient has failed these meds in the past: n/a Patient is currently controlledon the following medications: metoprolol tartrate  12.5 mg bid  Patient checks BP at home weekly  Patient home BP readings are ranging: 120/70  We discussed diet and exercise extensively Zio patch results today - no change.Patient remains active every day around his home. He eats well but can't maintain/gain weight well so supplementing with Boost for additional protein/calories. Patient has days where he doesn't feel well. Pharmacist requested that he check his blood pressure/pulse and document how he was filling.   Update 10/22/2019 - Patient's daughter called with update on her dad. He is still complaining of dizziness. He is not consistently checking bp to correlate dizziness. Daughter reports that he is likely not taking metoprolol consistently on same schedule. She is making him a chart for medication administration. She is also going to remind him to take pulse and bp during these episodes to report back.   Plan  Continue  current medications    Hypothyroidism   TSH  Date Value Ref Range Status  08/15/2019 0.326 (L) 0.450 - 4.500 uIU/mL Final   Patient has failed these meds in past: n/a Patient is currently uncontrolled on the following medications: synthroid 175 mcg daily  We discussed:  Patient has had lots of fluctuations with TSH in the past year. He is consistently taking brand name Synthroid on an empty stomach at least 30 mins prior to all other food or medication.    Update 10/22/2019 - Patient is struggling to take am meds on consistent schedule. Have encouraged patient's daughter that empty stomach and before all other medications is best but consistent administration is also important. She is working on a chart to help stay consistent with medication administration.   Plan  Continue current medications   Medication Management   [facility] [facility] Uses pill box? Yes - has one but uses the  Pt endorses excellent compliance  We discussed: Patient is taking medications well and leaving them out on dining room table.   Update 10/22/2019 - Patient's daughter is going to try the chart option for improved adherence/compliance. She will consider packaging if continued concern on medication administration.   Plan  Continue current medication management strategy    Follow up: August 2021

## 2019-10-22 NOTE — Patient Instructions (Addendum)
Visit Information  Goals Addressed            This Visit's Progress   . Pharmacy Care Plan       CARE PLAN ENTRY  Current Barriers:  . Chronic Disease Management support, education, and care coordination needs related to Hypertension and Hypothyroidism   Hypertension . Pharmacist Clinical Goal(s): o Over the next 90 days, patient will work with PharmD and providers to maintain BP goal <140/90 . Current regimen:  o Metoprolol 12.5 mg bid . Interventions: o Recommend patient take metoprolol tartrate twice daily on a regular dosing schedule.  o Recommend patient utilize chart from daughter to stay on track with consistent dosing.  . Patient self care activities - Over the next 90 days, patient will: o Check BP weekly, document, and provide at future appointments o Ensure daily salt intake < 2300 mg/day  Hypothyroidism . Pharmacist Clinical Goal(s): o Over the next 90 days, patient will work with PharmD and providers to achieve in range TSH  . Current regimen:  o Synthroid 175 mcg daily . Interventions: o Recommend patient take thyroid medication consistently each day at same time.  . Patient self care activities - Over the next 90 days, patient will: o Continue taking Synthroid daily each am 30 minutes prior to food.  Medication management . Pharmacist Clinical Goal(s): o Over the next 90 days, patient will work with PharmD and providers to maintain optimal medication adherence . [facility] . Interventions o Comprehensive medication review performed. o Continue current medication management strategy . Patient self care activities - Over the next 90 days, patient will: o Focus on medication adherence by using pill box and chart o Take medications as prescribed o Report any questions or concerns to PharmD and/or provider(s)  Please see past updates related to this goal by clicking on the "Past Updates" button in the selected goal      . COMPLETED: [place] (see longitudinal plan of care for additional care plan information)  Current Barriers:  . Uncontrolled hyperlipidemia, complicated by age . Current antihyperlipidemic regimen: diet/exercise . Previous antihyperlipidemic medications tried simvastatin 40 mg . Most recent lipid panel:     Component Value Date/Time   CHOL 185 08/15/2019 0959   TRIG 84 08/15/2019 0959   HDL 55 08/15/2019 0959   CHOLHDL 3.4 08/15/2019 0959   LDLCALC 115 (H) 08/15/2019 0959 .   Marland Kitchen ASCVD risk enhancing conditions: age >39, DM, HTN, CKD, CHF, current smoker   Pharmacist Clinical Goal(s):  Marland Kitchen Over the next 90 days, patient will work with PharmD and providers towards optimized antihyperlipidemic therapy  Interventions: . Comprehensive medication review performed; medication list updated in electronic medical record.  Bertram Savin care team collaboration (see longitudinal plan of care)   Patient Self Care Activities:  . Patient will focus on lifestyle modifications of a good diet and remaining active daily  Initial goal documentation        The patient verbalized understanding of instructions provided today and declined a print copy of patient instruction materials.   Telephone follow up appointment with pharmacy team member scheduled for: 12/2019  Sherre Poot, PharmD, [facility] Clinical Pharmacist [facility] [phone] (office) [phone] (mobile)  Levothyroxine tablets What is this medicine? LEVOTHYROXINE (lee voe thye ROX een) is a thyroid hormone. This medicine can improve symptoms of thyroid deficiency such as slow speech, lack of energy, weight gain, hair loss,  dry skin, and feeling cold. It also helps to treat goiter (an enlarged thyroid gland). It is also used to treat some kinds of thyroid cancer along with surgery and other medicines. This medicine may be used for other purposes; ask your health care provider or pharmacist if you  have questions. COMMON BRAND NAME(S): Estre, Euthyrox, Levo-T, Levothroid, Levoxyl, Synthroid, Thyro-Tabs, Unithroid What should I tell my health care provider before I take this medicine? They need to know if you have any of these conditions:  Addison's disease or other adrenal gland problem  angina  bone problems  diabetes  dieting or on a weight loss program  fertility problems  heart disease  pituitary gland problem  take medicines that treat or prevent blood clots  an unusual or allergic reaction to levothyroxine, thyroid hormones, other medicines, foods, dyes, or preservatives  pregnant or trying to get pregnant  breast-feeding How should I use this medicine? Take this medicine by mouth with plenty of water. It is best to take on an empty stomach, at least 30 minutes to one hour before breakfast. Avoid taking antacids containing aluminum or magnesium, simethicone, bile acid sequestrants, calcium carbonate, sodium polystyrene sulfonate, ferrous sulfate, sevelamer, lanthanum, or sucralfate within 4 hours of taking this medicine. Follow the directions on the prescription label. Take at the same time each day. Do not take your medicine more often than directed. Contact your pediatrician regarding the use of this medicine in children. While this drug may be prescribed for children and infants as young as a few days of age for selected conditions, precautions do apply. For infants, you may crush the tablet and place in a small amount of (5 to 10 mL or 1 to 2 teaspoonfuls) of water, breast milk, or non-soy based infant formula. Do not mix with soy-based infant formula. Give as directed. Overdosage: If you think you have taken too much of this medicine contact a poison control center or emergency room at once. NOTE: This medicine is only for you. Do not share this medicine with others. What if I miss a dose? If you miss a dose, take it as soon as you can. If it is almost time for your  next dose, take only that dose. Do not take double or extra doses. What may interact with this medicine?  amiodarone  antacids  anti-thyroid medicines  calcium supplements  carbamazepine  certain medicines for depression  certain medicines to treat cancer  cholestyramine  clofibrate  colesevelam  colestipol  digoxin  male hormones, like estrogens and birth control pills, patches, rings, or injections  iron supplements  ketamine  lanthanum  liquid nutrition products like Ensure  lithium  medicines for colds and breathing difficulties  medicines for diabetes  medicines or dietary supplements for weight loss  methadone  niacin  orlistat  oxandrolone  phenobarbital or other barbiturates  phenytoin  rifampin  sevelamer  simethicone  sodium polystyrene sulfonate  soy isoflavones  steroid medicines like prednisone or cortisone  sucralfate  testosterone  theophylline  warfarin This list may not describe all possible interactions. Give your health care provider a list of all the medicines, herbs, non-prescription drugs, or dietary supplements you use. Also tell them if you smoke, drink alcohol, or use illegal drugs. Some items may interact with your medicine. What should I watch for while using this medicine? Be sure to take this medicine with plenty of fluids. Some tablets may cause choking, gagging, or difficulty swallowing from the tablet getting stuck in  your throat. Most of these problems disappear if the medicine is taken with the right amount of water or other fluids. Do not switch brands of this medicine unless your health care professional agrees with the change. Ask questions if you are uncertain. You will need regular exams and occasional blood tests to check the response to treatment. If you are receiving this medicine for an underactive thyroid, it may be several weeks before you notice an improvement. Check with your doctor or  health care professional if your symptoms do not improve. It may be necessary for you to take this medicine for the rest of your life. Do not stop using this medicine unless your doctor or health care professional advises you to. This medicine can affect blood sugar levels. If you have diabetes, check your blood sugar as directed. You may lose some of your hair when you first start treatment. With time, this usually corrects itself. If you are going to have surgery, tell your doctor or health care professional that you are taking this medicine. What side effects may I notice from receiving this medicine? Side effects that you should report to your doctor or health care professional as soon as possible:  allergic reactions like skin rash, itching or hives, swelling of the face, lips, or tongue  anxious  breathing problems  changes in menstrual periods  chest pain  diarrhea  excessive sweating or intolerance to heat  fast or irregular heartbeat  leg cramps  nervousness  swelling of ankles, feet, or legs  tremors  trouble sleeping  vomiting Side effects that usually do not require medical attention (report to your doctor or health care professional if they continue or are bothersome):  changes in appetite  headache  irritable  nausea  weight loss This list may not describe all possible side effects. Call your doctor for medical advice about side effects. You may report side effects to FDA at 1-800-FDA-1088. Where should I keep my medicine? Keep out of the reach of children. Store at room temperature between 15 and 30 degrees C (59 and 86 degrees F). Protect from light and moisture. Keep container tightly closed. Throw away any unused medicine after the expiration date. NOTE: This sheet is a summary. It may not cover all possible information. If you have questions about this medicine, talk to your doctor, pharmacist, or health care provider.  2020 [employer]  Standard (2018-11-30 15:09:06)

## 2019-10-25 ENCOUNTER — Telehealth: Payer: Self-pay | Admitting: Cardiology

## 2019-10-25 MED ORDER — METOPROLOL SUCCINATE ER 25 MG PO TB24: 25.0000 mg | ORAL_TABLET | Freq: Every day | ORAL | 2 refills | Status: DC

## 2019-10-25 NOTE — Telephone Encounter (Signed)
Lets switch to metoprolol succinate 25 mg once a day or if that might not be enough he may take that medication i at the evening time

## 2019-10-25 NOTE — Telephone Encounter (Signed)
Called and spoke to patient's daughter per dpr. Advised her per Dr. Agustin Cree we will stop metoprolol tartrate and start metoprolol succinate 25 mg daily. She verbally understood no further questions.

## 2019-10-25 NOTE — Telephone Encounter (Signed)
Called and spoke with the patient's daughter per dpr. She reports that the patient has been experiencing dizziness in the morning after taking his Lopressor it doesn't last all day. However, she was wondering if Dr. Agustin Cree would advise anything on this. I informed her to share with the patient the importance of staying hydrated and getting up from a sitting position to a standing position. She verbally understood. Will consult with Dr. Agustin Cree.

## 2019-10-25 NOTE — Telephone Encounter (Signed)
STAT if patient feels like he/she is going to faint   1) Are you dizzy now? Hasn't been with him today, but normally occurs in the morning   2) Do you feel faint or have you passed out? No   3) Do you have any other symptoms? No other symptoms   4) Have you checked your HR and BP (record if available)? Hasn't checked today     [name] is calling stating this occurs 3-5 times a week and has been going on for 3 to 4 months.

## 2019-11-15 ENCOUNTER — Other Ambulatory Visit: Payer: Self-pay

## 2019-11-15 MED ORDER — DOXYCYCLINE HYCLATE 100 MG PO TABS: 100.0000 mg | ORAL_TABLET | Freq: Two times a day (BID) | ORAL | 0 refills | Status: DC

## 2019-11-29 ENCOUNTER — Ambulatory Visit: Payer: Medicare HMO | Admitting: Cardiology

## 2019-11-29 ENCOUNTER — Other Ambulatory Visit: Payer: Self-pay

## 2019-11-29 ENCOUNTER — Encounter: Payer: Self-pay | Admitting: Cardiology

## 2019-11-29 VITALS — BP 150/62 | HR 73 | Ht 73.0 in | Wt 125.0 lb

## 2019-11-29 DIAGNOSIS — I10 Essential (primary) hypertension: Secondary | ICD-10-CM

## 2019-11-29 DIAGNOSIS — I4729 Other ventricular tachycardia: Secondary | ICD-10-CM

## 2019-11-29 DIAGNOSIS — R42 Dizziness and giddiness: Secondary | ICD-10-CM

## 2019-11-29 DIAGNOSIS — R001 Bradycardia, unspecified: Secondary | ICD-10-CM

## 2019-11-29 DIAGNOSIS — E782 Mixed hyperlipidemia: Secondary | ICD-10-CM

## 2019-11-29 DIAGNOSIS — I472 Ventricular tachycardia: Secondary | ICD-10-CM | POA: Diagnosis not present

## 2019-11-29 NOTE — Patient Instructions (Signed)

## 2019-11-29 NOTE — Progress Notes (Signed)
Cardiology Office Note:    Date:  11/29/2019   ID:  James Iha Sr., DOB 12/14/1931, MRN [id number]  PCP:  [name], MD  Cardiologist:  [name], MD    Referring MD: [name],*   No chief complaint on file. I am doing better  History of Present Illness:    James Siegel Sr. is a 86 y.o. male he does have history of bradycardia however the latest monitor showed slowest heart rate 45 at night average heart rate was acceptable.  Last time we changed a limited schedule of his medication and I started giving him metoprolol at evening time and that seems to be improving situation.  Denies have any chest pain tightness squeezing pressure burning chest overall he is always downgrading his symptoms.  His daughter is is with him and she basically communicated with me he is hard of hearing.  She said that he is doing fine still pedal around and does as much as he wants.  No falls.  Past Medical History:  Diagnosis Date  . Allergic rhinitis   . History of prostate cancer    seed implant  . Hyperlipemia   . Mild asthma   . Rhinosinusitis     Past Surgical History:  Procedure Laterality Date  . APPENDECTOMY      Current Medications: Current Meds  Medication Sig  . albuterol (VENTOLIN HFA) 108 (90 Base) MCG/ACT inhaler INHALE 2 PUFFS INTO THE LUNGS EVERY 6 HOURS AS NEEDED FOR WHEEZING OR SHORTNESS OF BREATH  . alendronate (FOSAMAX) 70 MG tablet TAKE 1 TABLET BY MOUTH ONCE WEEKLY IN THE MORNING, AT LEAST 30 MIN BEFORE FIRST FOOD, BEVERAGE, OR MEDICATION OF DAY  . Fexofenadine HCl (ALLEGRA PO) Take daily as needed by mouth.  Marland Kitchen ipratropium-albuterol (DUONEB) 0.5-2.5 (3) MG/3ML SOLN Take 3 mLs by nebulization every 4 (four) hours as needed.  . metoprolol succinate (TOPROL-XL) 25 MG 24 hr tablet Take 1 tablet (25 mg total) by mouth daily. Take with or immediately following a meal.  . Multiple Vitamins-Minerals (MULTIVITAMIN ADULT PO) Take 1 tablet by  mouth daily.   . Nutritional Supplements (BOOST NUTRITIONAL ENERGY PO) Take by mouth. Drinking 1-2 per day  . SYNTHROID 175 MCG tablet TAKE 1 TABLET BY MOUTH EVERY DAY BEFORE BREAKFAST  . triamcinolone (NASACORT ALLERGY 24HR) 55 MCG/ACT AERO nasal inhaler Place 1 spray into the nose daily as needed.      Allergies:   Clarithromycin   Social History   Socioeconomic History  . Marital status: Married    Spouse name: Not on file  . Number of children: Not on file  . Years of education: Not on file  . Highest education level: Not on file  Occupational History  . Occupation: Retired    Fish farm manager: OTHER    Comment: Scientist, research (life sciences)  Tobacco Use  . Smoking status: Former Smoker    Types: Cigarettes    Quit date: 1960    Years since quitting: 61.6  . Smokeless tobacco: Former Systems developer    Types: [place] date: 2011  Vaping Use  . Vaping Use: Never used  Substance and Sexual Activity  . Alcohol use: No  . Drug use: No  . Sexual activity: Not Currently  Other Topics Concern  . Not on file  Social History Narrative  . Not on file   Social Determinants of Health   Financial Resource Strain:   . Difficulty of Paying Living Expenses:  Food Insecurity:   . Worried About Charity fundraiser in the Last Year:   . Arboriculturist in the Last Year:   Transportation Needs: No Transportation Needs  . Lack of Transportation (Medical): No  . Lack of Transportation (Non-Medical): No  Physical Activity: Sufficiently Active  . Days of Exercise per Week: 6 days  . Minutes of Exercise per Session: 30 min  Stress:   . Feeling of Stress :   Social Connections:   . Frequency of Communication with Friends and Family:   . Frequency of Social Gatherings with Friends and Family:   . Attends Religious Services:   . Active Member of Clubs or Organizations:   . Attends Archivist Meetings:   Marland Kitchen Marital Status:      Family History: The patient's family history includes  Alzheimer's disease in his father; Diabetes in his mother. ROS:   Please see the history of present illness.    All 14 point review of systems negative except as described per history of present illness  EKGs/Labs/Other Studies Reviewed:      Recent Labs: 08/15/2019: ALT 14; BUN 16; Creatinine, Ser 0.98; Hemoglobin 11.2; Platelets 258; Potassium 4.7; Sodium 136; TSH 0.326  Recent Lipid Panel    Component Value Date/Time   CHOL 185 08/15/2019 0959   TRIG 84 08/15/2019 0959   HDL 55 08/15/2019 0959   CHOLHDL 3.4 08/15/2019 0959   LDLCALC 115 (H) 08/15/2019 0959    Physical Exam:    VS:  BP (!) 150/62 (BP Location: Right Arm, Patient Position: Sitting, Cuff Size: Normal)   Pulse 73   Ht 6\' 1"  (1.854 m)   Wt 125 lb (56.7 kg)   SpO2 98%   BMI 16.49 kg/m     Wt Readings from Last 3 Encounters:  11/29/19 125 lb (56.7 kg)  08/15/19 123 lb (55.8 kg)  08/06/19 123 lb (55.8 kg)     GEN:  Well nourished, well developed in no acute distress HEENT: Normal NECK: No JVD; No carotid bruits LYMPHATICS: No lymphadenopathy CARDIAC: RRR, no murmurs, no rubs, no gallops RESPIRATORY:  Clear to auscultation without rales, wheezing or rhonchi  ABDOMEN: Soft, non-tender, non-distended MUSCULOSKELETAL:  No edema; No deformity  SKIN: Warm and dry LOWER EXTREMITIES: no swelling NEUROLOGIC:  Alert and oriented x 3 PSYCHIATRIC:  Normal affect   ASSESSMENT:    1. Nonsustained ventricular tachycardia (HCC)   2. Essential hypertension   3. Hyperlipidemia, mixed   4. Dizziness   5. Bradycardia    PLAN:    In order of problems listed above:  1. Nonsustained ventricular tachycardia that is why he is on beta-blocker, however, echocardiogram showed preserved ejection fraction, stress test showed no evidence of ischemia.  Conservative approach. 2. Essential hypertension blood pressure slightly elevated today.  However the check it at home is always good. 3. Dyslipidemia: I will call primary  care physician to get his fasting lipid profile. 4. Dizziness I do not think it is related to bradycardia since he does not have severe bradycardia.  But we arranged his medications that seems to be helping in the future his dizziness will be still happening we may be temporarily stopping his beta-blocker to see how he does. 5. Bradycardia not significant based on last tests.   Medication Adjustments/Labs and Tests Ordered: Current medicines are reviewed at length with the patient today.  Concerns regarding medicines are outlined above.  No orders of the defined types were placed in this  encounter.  Medication changes: No orders of the defined types were placed in this encounter.   Signed, Park Liter, MD, [facility] 11/29/2019 3:54 PM    [place]

## 2019-12-05 ENCOUNTER — Ambulatory Visit (INDEPENDENT_AMBULATORY_CARE_PROVIDER_SITE_OTHER): Payer: Medicare HMO | Admitting: Legal Medicine

## 2019-12-05 ENCOUNTER — Other Ambulatory Visit: Payer: Self-pay

## 2019-12-05 ENCOUNTER — Encounter: Payer: Self-pay | Admitting: Legal Medicine

## 2019-12-05 VITALS — BP 140/70 | HR 70 | Temp 97.3°F | Ht 68.5 in | Wt 125.4 lb

## 2019-12-05 DIAGNOSIS — L2081 Atopic neurodermatitis: Secondary | ICD-10-CM | POA: Diagnosis not present

## 2019-12-05 DIAGNOSIS — L209 Atopic dermatitis, unspecified: Secondary | ICD-10-CM | POA: Insufficient documentation

## 2019-12-05 HISTORY — DX: Atopic dermatitis, unspecified: L20.9

## 2019-12-05 MED ORDER — HYDROXYZINE HCL 25 MG PO TABS: 25.0000 mg | ORAL_TABLET | Freq: Three times a day (TID) | ORAL | 3 refills | Status: DC | PRN

## 2019-12-05 MED ORDER — TRIAMCINOLONE ACETONIDE 40 MG/ML IJ SUSP
80.0000 mg | Freq: Once | INTRAMUSCULAR | Status: AC
  Administered 2019-12-05: 80 mg via INTRAMUSCULAR

## 2019-12-05 NOTE — Progress Notes (Signed)
Subjective:  Patient ID: James Iha Sr., male    DOB: 07/16/1931  Age: 86 y.o. MRN: [id number]  Chief Complaint  Patient presents with  . Rash    Patient has a rash on his back for 2 weeks. He has tried things from over the counter and it did not help.    HPI: patient has diffuse rash on trunk fr 2 weeks. It is puritic and excoriated.  Little rash on legs and arms.   Current Outpatient Medications on File Prior to Visit  Medication Sig Dispense Refill  . albuterol (VENTOLIN HFA) 108 (90 Base) MCG/ACT inhaler INHALE 2 PUFFS INTO THE LUNGS EVERY 6 HOURS AS NEEDED FOR WHEEZING OR SHORTNESS OF BREATH 18 g 2  . alendronate (FOSAMAX) 70 MG tablet TAKE 1 TABLET BY MOUTH ONCE WEEKLY IN THE MORNING, AT LEAST 30 MIN BEFORE FIRST FOOD, BEVERAGE, OR MEDICATION OF DAY 12 tablet 4  . Fexofenadine HCl (ALLEGRA PO) Take daily as needed by mouth.    Marland Kitchen ipratropium-albuterol (DUONEB) 0.5-2.5 (3) MG/3ML SOLN Take 3 mLs by nebulization every 4 (four) hours as needed.    . metoprolol succinate (TOPROL-XL) 25 MG 24 hr tablet Take 1 tablet (25 mg total) by mouth daily. Take with or immediately following a meal. 30 tablet 2  . Multiple Vitamins-Minerals (MULTIVITAMIN ADULT PO) Take 1 tablet by mouth daily.     . Nutritional Supplements (BOOST NUTRITIONAL ENERGY PO) Take by mouth. Drinking 1-2 per day    . SYNTHROID 175 MCG tablet TAKE 1 TABLET BY MOUTH EVERY DAY BEFORE BREAKFAST 90 tablet 2  . triamcinolone (NASACORT ALLERGY 24HR) 55 MCG/ACT AERO nasal inhaler Place 1 spray into the nose daily as needed.      No current facility-administered medications on file prior to visit.   Past Medical History:  Diagnosis Date  . Allergic rhinitis   . History of prostate cancer    seed implant  . Hyperlipemia   . Mild asthma   . Rhinosinusitis    Past Surgical History:  Procedure Laterality Date  . APPENDECTOMY      Family History  Problem Relation Age of Onset  . Alzheimer's disease Father   . Diabetes  Mother    Social History   Socioeconomic History  . Marital status: Married    Spouse name: Not on file  . Number of children: Not on file  . Years of education: Not on file  . Highest education level: Not on file  Occupational History  . Occupation: Retired    Fish farm manager: OTHER    Comment: Scientist, research (life sciences)  Tobacco Use  . Smoking status: Former Smoker    Types: Cigarettes    Quit date: 1960    Years since quitting: 61.6  . Smokeless tobacco: Former Systems developer    Types: [place] date: 2011  Vaping Use  . Vaping Use: Never used  Substance and Sexual Activity  . Alcohol use: No  . Drug use: No  . Sexual activity: Not Currently  Other Topics Concern  . Not on file  Social History Narrative  . Not on file   Social Determinants of Health   Financial Resource Strain:   . Difficulty of Paying Living Expenses:   Food Insecurity:   . Worried About Charity fundraiser in the Last Year:   . Arboriculturist in the Last Year:   Transportation Needs: No Transportation Needs  . Lack of Transportation (Medical): No  .  Lack of Transportation (Non-Medical): No  Physical Activity: Sufficiently Active  . Days of Exercise per Week: 6 days  . Minutes of Exercise per Session: 30 min  Stress:   . Feeling of Stress :   Social Connections:   . Frequency of Communication with Friends and Family:   . Frequency of Social Gatherings with Friends and Family:   . Attends Religious Services:   . Active Member of Clubs or Organizations:   . Attends Archivist Meetings:   Marland Kitchen Marital Status:     Review of Systems  Constitutional: Negative.   HENT: Negative.   Eyes: Negative.   Respiratory: Negative.   Cardiovascular: Negative.   Gastrointestinal: Negative.   Genitourinary: Negative.   Skin: Positive for rash.  Neurological: Negative.   Psychiatric/Behavioral: Negative.      Objective:  BP (!) 140/70 (BP Location: Right Arm, Patient Position: Sitting)   Pulse 70   Temp  (!) 97.3 F (36.3 C) (Temporal)   Ht 5' 8.5" (1.74 m)   Wt 125 lb 6.4 oz (56.9 kg)   BMI 18.79 kg/m   BP/Weight 12/05/2019 4/65/6812 11/10/1698  Systolic BP 174 944 967  Diastolic BP 70 62 70  Wt. (Lbs) 125.4 125 123  BMI 18.79 16.49 16.23    Physical Exam Vitals reviewed.  Constitutional:      Appearance: Normal appearance.  HENT:     Right Ear: Tympanic membrane normal.     Left Ear: Tympanic membrane normal.  Cardiovascular:     Rate and Rhythm: Normal rate and regular rhythm.     Pulses: Normal pulses.     Heart sounds: Normal heart sounds.  Pulmonary:     Effort: Pulmonary effort is normal.     Breath sounds: Normal breath sounds.  Abdominal:     General: Abdomen is flat. Bowel sounds are normal.  Musculoskeletal:        General: Normal range of motion.     Cervical back: Normal range of motion.  Skin:    Comments: Diffuse macula-papular rash on trunk with excoriation.  Small amount rash on upper arms  Neurological:     General: No focal deficit present.     Mental Status: He is alert. Mental status is at baseline.       Lab Results  Component Value Date   WBC 8.9 08/15/2019   HGB 11.2 (L) 08/15/2019   HCT 34.7 (L) 08/15/2019   PLT 258 08/15/2019   GLUCOSE 96 08/15/2019   CHOL 185 08/15/2019   TRIG 84 08/15/2019   HDL 55 08/15/2019   LDLCALC 115 (H) 08/15/2019   ALT 14 08/15/2019   AST 20 08/15/2019   NA 136 08/15/2019   K 4.7 08/15/2019   CL 102 08/15/2019   CREATININE 0.98 08/15/2019   BUN 16 08/15/2019   CO2 21 08/15/2019   TSH 0.326 (L) 08/15/2019      Assessment & Plan:   1. Atopic neurodermatitis - triamcinolone acetonide (KENALOG-40) injection 80 mg - hydrOXYzine (ATARAX/VISTARIL) 25 MG tablet; Take 1 tablet (25 mg total) by mouth 3 (three) times daily as needed.  Dispense: 30 tablet; Refill: 3  AN INDIVIDUAL CARE PLAN atopy was established and reinforced today.  The patient's status was assessed using clinical findings on exam, labs,  and other diagnostic testing. Patient's success at meeting treatment goals based on disease specific evidence-bassed guidelines and found to be in poor control. RECOMMENDATIONS include Kenalog 80mg  IM and increase eucerin to BID and only shower  every second day.  Meds ordered this encounter  Medications  . triamcinolone acetonide (KENALOG-40) injection 80 mg  . hydrOXYzine (ATARAX/VISTARIL) 25 MG tablet    Sig: Take 1 tablet (25 mg total) by mouth 3 (three) times daily as needed.    Dispense:  30 tablet    Refill:  3       Follow-up: Return if symptoms worsen or fail to improve.  An After Visit Summary was printed and given to the patient.  [place] [phone]

## 2019-12-12 ENCOUNTER — Ambulatory Visit (INDEPENDENT_AMBULATORY_CARE_PROVIDER_SITE_OTHER): Payer: Medicare HMO | Admitting: Legal Medicine

## 2019-12-12 ENCOUNTER — Encounter: Payer: Self-pay | Admitting: Legal Medicine

## 2019-12-12 ENCOUNTER — Other Ambulatory Visit: Payer: Self-pay

## 2019-12-12 VITALS — BP 136/80 | HR 71 | Temp 97.2°F | Resp 17 | Ht 68.5 in | Wt 122.2 lb

## 2019-12-12 DIAGNOSIS — I4729 Other ventricular tachycardia: Secondary | ICD-10-CM

## 2019-12-12 DIAGNOSIS — I10 Essential (primary) hypertension: Secondary | ICD-10-CM | POA: Diagnosis not present

## 2019-12-12 DIAGNOSIS — E440 Moderate protein-calorie malnutrition: Secondary | ICD-10-CM

## 2019-12-12 DIAGNOSIS — R351 Nocturia: Secondary | ICD-10-CM

## 2019-12-12 DIAGNOSIS — I472 Ventricular tachycardia: Secondary | ICD-10-CM | POA: Diagnosis not present

## 2019-12-12 DIAGNOSIS — N401 Enlarged prostate with lower urinary tract symptoms: Secondary | ICD-10-CM

## 2019-12-12 DIAGNOSIS — M810 Age-related osteoporosis without current pathological fracture: Secondary | ICD-10-CM

## 2019-12-12 DIAGNOSIS — E039 Hypothyroidism, unspecified: Secondary | ICD-10-CM | POA: Diagnosis not present

## 2019-12-12 DIAGNOSIS — E782 Mixed hyperlipidemia: Secondary | ICD-10-CM

## 2019-12-12 DIAGNOSIS — H906 Mixed conductive and sensorineural hearing loss, bilateral: Secondary | ICD-10-CM | POA: Diagnosis not present

## 2019-12-12 NOTE — Progress Notes (Signed)
Subjective:  Patient ID: James Iha Sr., male    DOB: 05/20/1931  Age: 86 y.o. MRN: [id number]  Chief Complaint  Patient presents with  . Hypertension  . Hypothyroidism    HPI: chronic visit  Patient presents for follow up of hypertension.  Patient tolerating metoprolol well with side effects.  Patient was diagnosed with hypertension 2010 so has been treated for hypertension for 10 years.Patient is working on maintaining diet and exercise regimen and follows up as directed. Complication include none.  Patient has HYPOTHYROIDISM.  Diagnosed 10 years ago.  Patient has stable thyroid readings.  Patient is having no symptoms.  Last TSH was normal.  continue dosage of thyroid medicine.   Current Outpatient Medications on File Prior to Visit  Medication Sig Dispense Refill  . albuterol (VENTOLIN HFA) 108 (90 Base) MCG/ACT inhaler INHALE 2 PUFFS INTO THE LUNGS EVERY 6 HOURS AS NEEDED FOR WHEEZING OR SHORTNESS OF BREATH 18 g 2  . alendronate (FOSAMAX) 70 MG tablet TAKE 1 TABLET BY MOUTH ONCE WEEKLY IN THE MORNING, AT LEAST 30 MIN BEFORE FIRST FOOD, BEVERAGE, OR MEDICATION OF DAY 12 tablet 4  . Fexofenadine HCl (ALLEGRA PO) Take daily as needed by mouth.    . hydrOXYzine (ATARAX/VISTARIL) 25 MG tablet Take 1 tablet (25 mg total) by mouth 3 (three) times daily as needed. 30 tablet 3  . ipratropium-albuterol (DUONEB) 0.5-2.5 (3) MG/3ML SOLN Take 3 mLs by nebulization every 4 (four) hours as needed.    . metoprolol succinate (TOPROL-XL) 25 MG 24 hr tablet Take 1 tablet (25 mg total) by mouth daily. Take with or immediately following a meal. (Patient taking differently: Take 25 mg by mouth daily. Take with or immediately following evening meal.) 30 tablet 2  . Multiple Vitamins-Minerals (MULTIVITAMIN ADULT PO) Take 1 tablet by mouth daily.     . Nutritional Supplements (BOOST NUTRITIONAL ENERGY PO) Take by mouth. Drinking 1-2 per day    . SYNTHROID 175 MCG tablet TAKE 1 TABLET BY MOUTH EVERY DAY  BEFORE BREAKFAST 90 tablet 2   No current facility-administered medications on file prior to visit.   Past Medical History:  Diagnosis Date  . Allergic rhinitis   . History of prostate cancer    seed implant  . Hyperlipemia   . Mild asthma   . Rhinosinusitis    Past Surgical History:  Procedure Laterality Date  . APPENDECTOMY      Family History  Problem Relation Age of Onset  . Alzheimer's disease Father   . Diabetes Mother    Social History   Socioeconomic History  . Marital status: Married    Spouse name: Not on file  . Number of children: Not on file  . Years of education: Not on file  . Highest education level: Not on file  Occupational History  . Occupation: Retired    Fish farm manager: OTHER    Comment: Scientist, research (life sciences)  Tobacco Use  . Smoking status: Former Smoker    Types: Cigarettes    Quit date: 1960    Years since quitting: 61.6  . Smokeless tobacco: Former Systems developer    Types: [place] date: 2011  Vaping Use  . Vaping Use: Never used  Substance and Sexual Activity  . Alcohol use: No  . Drug use: No  . Sexual activity: Not Currently  Other Topics Concern  . Not on file  Social History Narrative  . Not on file   Social Determinants of Health  Financial Resource Strain:   . Difficulty of Paying Living Expenses:   Food Insecurity:   . Worried About Charity fundraiser in the Last Year:   . Arboriculturist in the Last Year:   Transportation Needs: No Transportation Needs  . Lack of Transportation (Medical): No  . Lack of Transportation (Non-Medical): No  Physical Activity: Sufficiently Active  . Days of Exercise per Week: 6 days  . Minutes of Exercise per Session: 30 min  Stress:   . Feeling of Stress :   Social Connections:   . Frequency of Communication with Friends and Family:   . Frequency of Social Gatherings with Friends and Family:   . Attends Religious Services:   . Active Member of Clubs or Organizations:   . Attends Theatre manager Meetings:   Marland Kitchen Marital Status:     Review of Systems  Constitutional: Negative.   HENT: Negative.   Eyes: Negative.   Respiratory: Negative.   Cardiovascular: Negative.   Gastrointestinal: Negative.   Genitourinary: Negative.   Musculoskeletal: Negative.   Skin: Negative.   Neurological: Negative.   Psychiatric/Behavioral: Negative.      Objective:  BP 136/80 (BP Location: Left Arm, Patient Position: Sitting)   Pulse 71   Temp (!) 97.2 F (36.2 C) (Temporal)   Resp 17   Ht 5' 8.5" (1.74 m)   Wt 122 lb 3.2 oz (55.4 kg)   SpO2 97%   BMI 18.31 kg/m   BP/Weight 12/12/2019 12/05/2019 7/82/9562  Systolic BP 130 865 784  Diastolic BP 80 70 62  Wt. (Lbs) 122.2 125.4 125  BMI 18.31 18.79 16.49    Physical Exam Vitals reviewed.  Constitutional:      Appearance: Normal appearance.  HENT:     Head: Normocephalic and atraumatic.     Right Ear: Tympanic membrane, ear canal and external ear normal.     Left Ear: Tympanic membrane, ear canal and external ear normal.     Nose: Nose normal.     Mouth/Throat:     Mouth: Mucous membranes are moist.  Eyes:     Extraocular Movements: Extraocular movements intact.     Conjunctiva/sclera: Conjunctivae normal.     Pupils: Pupils are equal, round, and reactive to light.  Cardiovascular:     Rate and Rhythm: Normal rate and regular rhythm.     Pulses: Normal pulses.     Heart sounds: Normal heart sounds.  Pulmonary:     Effort: Pulmonary effort is normal.     Breath sounds: Normal breath sounds.  Abdominal:     General: Abdomen is flat. Bowel sounds are normal.     Palpations: Abdomen is soft.  Musculoskeletal:     Cervical back: Normal range of motion and neck supple.  Skin:    General: Skin is warm.     Capillary Refill: Capillary refill takes less than 2 seconds.  Neurological:     General: No focal deficit present.     Mental Status: He is alert. Mental status is at baseline.  Psychiatric:        Mood and  Affect: Mood normal.        Behavior: Behavior normal.        Thought Content: Thought content normal.       Lab Results  Component Value Date   WBC 8.9 08/15/2019   HGB 11.2 (L) 08/15/2019   HCT 34.7 (L) 08/15/2019   PLT 258 08/15/2019   GLUCOSE 96 08/15/2019  CHOL 185 08/15/2019   TRIG 84 08/15/2019   HDL 55 08/15/2019   LDLCALC 115 (H) 08/15/2019   ALT 14 08/15/2019   AST 20 08/15/2019   NA 136 08/15/2019   K 4.7 08/15/2019   CL 102 08/15/2019   CREATININE 0.98 08/15/2019   BUN 16 08/15/2019   CO2 21 08/15/2019   TSH 0.326 (L) 08/15/2019      Assessment & Plan:   1. Essential hypertension - Comprehensive metabolic panel - CBC with Differential/Platelet An individual hypertension care plan was established and reinforced today.  The patient's status was assessed using clinical findings on exam and labs or diagnostic tests. The patient's success at meeting treatment goals on disease specific evidence-based guidelines and found to be well controlled. SELF MANAGEMENT: The patient and I together assessed ways to personally work towards obtaining the recommended goals. RECOMMENDATIONS: avoid decongestants found in common cold remedies, decrease consumption of alcohol, perform routine monitoring of BP with home BP cuff, exercise, reduction of dietary salt, take medicines as prescribed, try not to miss doses and quit smoking.  Regular exercise and maintaining a healthy weight is needed.  Stress reduction may help. A CLINICAL SUMMARY including written plan identify barriers to care unique to individual due to social or financial issues.  We attempt to mutually creat solutions for individual and family understanding.  2. Hyperlipidemia, mixed - Lipid panel AN INDIVIDUAL CARE PLAN for hyperlipidemia/ cholesterol was established and reinforced today.  The patient's status was assessed using clinical findings on exam, lab and other diagnostic tests. The patient's disease status was  assessed based on evidence-based guidelines and found to be well controlled. MEDICATIONS were reviewed. SELF MANAGEMENT GOALS have been discussed and patient's success at attaining the goal of low cholesterol was assessed. RECOMMENDATION given include regular exercise 3 days a week and low cholesterol/low fat diet. CLINICAL SUMMARY including written plan to identify barriers unique to the patient due to social or economic  reasons was discussed.  3. Acquired hypothyroidism - TSH Patient is known to have hypothyroidism and is n treatment with levothyroxine 163mcg.  Patient was diagnosed 10 years ago.  Other treatment includes none.  Patient is compliant with medicines and last TSH 6 months ago.  Last TSH was normal.  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 Senile osteoporosis AN INDIVIDUAL CARE PLAN for osteoporosiswas established and reinforced today.  The patient's status was assessed using clinical findings on exam, labs, and other diagnostic testing. Patient's success at meeting treatment goals based on disease specific evidence-bassed guidelines and found to be in good control. RECOMMENDATIONS include maintaining present medicines and treatment.  6. Malnutrition of moderate degree (HCC) Supplement nutrition with protein/calorie supplement with meals to improve nutritional status.  7. Nonsustained ventricular tachycardia [facility]) Cardiology is following him for this and last tests show no arrthymias  8. Benign prostatic hyperplasia with nocturia - PSA AN INDIVIDUAL CARE PLAN for BPH was established and reinforced today.  The patient's status was assessed using clinical findings on exam, labs, and other diagnostic testing. Patient's success at meeting treatment goals based on disease specific evidence-bassed guidelines and found to be in good control. RECOMMENDATIONS include maintaining present medicines and treatment.       Orders Placed This Encounter  Procedures  . Comprehensive metabolic panel  . CBC with Differential/Platelet  . Lipid panel  . PSA  . TSH     Follow-up: Return in about 6 months (around 06/13/2020) for fasting.  An After Visit Summary was printed and given to the patient.  [place] [phone]

## 2019-12-13 ENCOUNTER — Other Ambulatory Visit: Payer: Self-pay | Admitting: Legal Medicine

## 2019-12-13 DIAGNOSIS — E039 Hypothyroidism, unspecified: Secondary | ICD-10-CM

## 2019-12-13 LAB — CBC WITH DIFFERENTIAL/PLATELET
Basophils Absolute: 0 10*3/uL (ref 0.0–0.2)
Basos: 0 %
EOS (ABSOLUTE): 0.1 10*3/uL (ref 0.0–0.4)
Eos: 1 %
Hematocrit: 35.1 % — ABNORMAL LOW (ref 37.5–51.0)
Hemoglobin: 11.6 g/dL — ABNORMAL LOW (ref 13.0–17.7)
Immature Grans (Abs): 0.2 10*3/uL — ABNORMAL HIGH (ref 0.0–0.1)
Immature Granulocytes: 1 %
Lymphocytes Absolute: 2.6 10*3/uL (ref 0.7–3.1)
Lymphs: 21 %
MCH: 28.3 pg (ref 26.6–33.0)
MCHC: 33 g/dL (ref 31.5–35.7)
MCV: 86 fL (ref 79–97)
Monocytes Absolute: 1.3 10*3/uL — ABNORMAL HIGH (ref 0.1–0.9)
Monocytes: 10 %
Neutrophils Absolute: 8.1 10*3/uL — ABNORMAL HIGH (ref 1.4–7.0)
Neutrophils: 67 %
Platelets: 343 10*3/uL (ref 150–450)
RBC: 4.1 x10E6/uL — ABNORMAL LOW (ref 4.14–5.80)
RDW: 13.6 % (ref 11.6–15.4)
WBC: 12.3 10*3/uL — ABNORMAL HIGH (ref 3.4–10.8)

## 2019-12-13 LAB — CARDIOVASCULAR RISK ASSESSMENT

## 2019-12-13 LAB — TSH: TSH: 0.248 u[IU]/mL — ABNORMAL LOW (ref 0.450–4.500)

## 2019-12-13 LAB — LIPID PANEL
Chol/HDL Ratio: 3 ratio (ref 0.0–5.0)
Cholesterol, Total: 197 mg/dL (ref 100–199)
HDL: 66 mg/dL (ref >39)
LDL Chol Calc (NIH): 118 mg/dL — ABNORMAL HIGH (ref 0–99)
Triglycerides: 71 mg/dL (ref 0–149)
VLDL Cholesterol Cal: 13 mg/dL (ref 5–40)

## 2019-12-13 LAB — PSA: Prostate Specific Ag, Serum: <0.1 ng/mL (ref 0.0–4.0)

## 2019-12-13 LAB — COMPREHENSIVE METABOLIC PANEL
ALT: 17 IU/L (ref 0–44)
AST: 20 IU/L (ref 0–40)
Albumin/Globulin Ratio: 1.7 (ref 1.2–2.2)
Albumin: 4.5 g/dL (ref 3.6–4.6)
Alkaline Phosphatase: 51 IU/L (ref 48–121)
BUN/Creatinine Ratio: 32 — ABNORMAL HIGH (ref 10–24)
BUN: 29 mg/dL — ABNORMAL HIGH (ref 8–27)
Bilirubin Total: 0.4 mg/dL (ref 0.0–1.2)
CO2: 21 mmol/L (ref 20–29)
Calcium: 9.4 mg/dL (ref 8.6–10.2)
Chloride: 99 mmol/L (ref 96–106)
Creatinine, Ser: 0.91 mg/dL (ref 0.76–1.27)
GFR calc Af Amer: 88 mL/min/{1.73_m2} (ref >59)
GFR calc non Af Amer: 76 mL/min/{1.73_m2} (ref >59)
Globulin, Total: 2.6 g/dL (ref 1.5–4.5)
Glucose: 92 mg/dL (ref 65–99)
Potassium: 4.8 mmol/L (ref 3.5–5.2)
Sodium: 133 mmol/L — ABNORMAL LOW (ref 134–144)
Total Protein: 7.1 g/dL (ref 6.0–8.5)

## 2019-12-13 MED ORDER — LEVOTHYROXINE SODIUM 150 MCG PO TABS: 150.0000 ug | ORAL_TABLET | Freq: Every day | ORAL | 2 refills | Status: DC

## 2019-12-13 NOTE — Progress Notes (Signed)
Patient a little dehydrated BUN high- drink more fluids, liver tests normal, wbc high 12.3 any illness?, still has mild chronic anemia, LDL cholesterol high, PSA <0.1TSH 0.248 still low- so we will decrease thyroid medicine. lp

## 2019-12-16 NOTE — Chronic Care Management (AMB) (Signed)
Chronic Care Management Pharmacy  Name: James Pandya Sr.  MRN: [id number] DOB: 01-30-1932  Chief Complaint/ HPI  James Iha Sr.,  86 y.o. , male presents for their Follow-Up CCM visit with the clinical pharmacist via telephone due to COVID-19 Pandemic.  PCP : James Anes, MD  Their chronic conditions include: HTN, Hypothyroidism, Osteoporosis, HLD, Allergies, Asthma.   Office Visits: 12/12/2019 - Patient a little dehydrated BUN high- drink more fluids, liver tests normal, wbc high 12.3 any illness?, still has mild chronic anemia, LDL cholesterol high, PSA <0.1TSH 0.248 still low- so we will decrease thyroid medicine. Reduce levothyroxine to 150 mcg daily.  12/05/2019 - Kenalog shot administered for atopic neurodermatitis. Prescribed hydroxyzine 25 mg tid prn. Change showering to every toerh day and increase Eucerin to bid.  08/22/2019 - patient has had a lot of ticks lately and has itching. Requested a doxycycline.  08/15/2019 - Hemoglobin low chronic anemia, kidney and liver tests normal, LDL cholesterol 115 up, tsh .326  07/05/2019 - TSH low, means too much thyroid, we should try 58mcg recheck 6 weeks 03/29/2019 - flu shot. Labs drawn. Decreased levothyroxine to 100 mcg since TSH too high. Mild anemia, kidney and liver ok.   Consult Visit: 11/29/2019 - Cardio - could trial of discontinuing beta blocker if dizziness persists.  08/06/2019 - Bradycardia with episode of dizziness.  I will ask him to wear another Zio patchTo make sure he does not have significant bradycardia that is related to dizziness.  07/18/2019 - Pulmonology - Dr. Annamaria Boots. No med changes.  04/01/2019 - Cardiology - No med changes.   Medications: Outpatient Encounter Medications as of 12/17/2019  Medication Sig  . albuterol (VENTOLIN HFA) 108 (90 Base) MCG/ACT inhaler INHALE 2 PUFFS INTO THE LUNGS EVERY 6 HOURS AS NEEDED FOR WHEEZING OR SHORTNESS OF BREATH  . alendronate (FOSAMAX) 70 MG tablet  TAKE 1 TABLET BY MOUTH ONCE WEEKLY IN THE MORNING, AT LEAST 30 MIN BEFORE FIRST FOOD, BEVERAGE, OR MEDICATION OF DAY  . Fexofenadine HCl (ALLEGRA PO) Take daily as needed by mouth.  . hydrOXYzine (ATARAX/VISTARIL) 25 MG tablet Take 1 tablet (25 mg total) by mouth 3 (three) times daily as needed.  Marland Kitchen ipratropium-albuterol (DUONEB) 0.5-2.5 (3) MG/3ML SOLN Take 3 mLs by nebulization every 4 (four) hours as needed.  Marland Kitchen levothyroxine (SYNTHROID) 150 MCG tablet Take 1 tablet (150 mcg total) by mouth daily before breakfast.  . metoprolol succinate (TOPROL-XL) 25 MG 24 hr tablet Take 1 tablet (25 mg total) by mouth daily. Take with or immediately following a meal. (Patient taking differently: Take 25 mg by mouth daily. Take with or immediately following evening meal.)  . Multiple Vitamins-Minerals (MULTIVITAMIN ADULT PO) Take 1 tablet by mouth daily.   . Nutritional Supplements (BOOST NUTRITIONAL ENERGY PO) Take by mouth. Drinking 1-2 per day   No facility-administered encounter medications on file as of 12/17/2019.   Allergies  Allergen Reactions  . Clarithromycin     REACTION: GI upset   SDOH Screenings   Alcohol Screen:   . Last Alcohol Screening Score (AUDIT):   Depression (PHQ2-9): Low Risk   . PHQ-2 Score: 0  Financial Resource Strain:   . Difficulty of Paying Living Expenses:   Food Insecurity:   . Worried About Charity fundraiser in the Last Year:   . [employer] of Food in the Last Year:   Housing: [place]   . Last Housing Risk Score: 0  Physical Activity: Sufficiently Active  .  Days of Exercise per Week: 6 days  . Minutes of Exercise per Session: 30 min  Social Connections:   . Frequency of Communication with Friends and Family:   . Frequency of Social Gatherings with Friends and Family:   . Attends Religious Services:   . Active Member of Clubs or Organizations:   . Attends Archivist Meetings:   Marland Kitchen Marital Status:   Stress:   . Feeling of Stress :   Tobacco Use:  Medium Risk  . Smoking Tobacco Use: Former Smoker  . Smokeless Tobacco Use: Former Soil scientist Needs: No Transportation Needs  . Lack of Transportation (Medical): No  . Lack of Transportation (Non-Medical): No     Current Diagnosis/Assessment:  Goals Addressed            This Visit's Progress   . Pharmacy Care Plan       CARE PLAN ENTRY  Current Barriers:  . Chronic Disease Management support, education, and care coordination needs related to Hypertension and Hypothyroidism   Hypertension . Pharmacist Clinical Goal(s): o Over the next 90 days, patient will work with PharmD and providers to maintain BP goal <140/90 . Current regimen:  o Metoprolol 25 mg daily . Interventions: o Patient resumed metorprolol 25 mg dosing once daily in the evening.  o Recommend patient utilize chart from daughter to stay on track with consistent dosing.  . Patient self care activities - Over the next 90 days, patient will: o Check BP weekly, document, and provide at future appointments o Ensure daily salt intake < 2300 mg/day  Hypothyroidism . Pharmacist Clinical Goal(s): o Over the next 90 days, patient will work with PharmD and providers to achieve in range TSH  . Current regimen:  o Synthroid 150 mcg daily . Interventions: o Recommend patient take thyroid medication consistently each day at same time.  o Pharmacist coordinating lab appointment with Dr. Henrene Pastor to avoid excess Synthroid if dosage changes at next check.  . Patient self care activities - Over the next 90 days, patient will: o Continue taking Synthroid daily each am 30 minutes prior to food.  Medication management . Pharmacist Clinical Goal(s): o Over the next 90 days, patient will work with PharmD and providers to maintain optimal medication adherence . [facility] . Interventions o Comprehensive medication review performed. o Continue current medication management strategy . Patient self care  activities - Over the next 90 days, patient will: o Focus on medication adherence by using pill box and chart o Take medications as prescribed o Report any questions or concerns to PharmD and/or provider(s)  Please see past updates related to this goal by clicking on the "Past Updates" button in the selected goal        Current Diagnosis/Assessment:  Goals Addressed            This Visit's Progress   . Pharmacy Care Plan       CARE PLAN ENTRY  Current Barriers:  . Chronic Disease Management support, education, and care coordination needs related to Hypertension and Hypothyroidism   Hypertension . Pharmacist Clinical Goal(s): o Over the next 90 days, patient will work with PharmD and providers to maintain BP goal <140/90 . Current regimen:  o Metoprolol 25 mg daily . Interventions: o Patient resumed metorprolol 25 mg dosing once daily in the evening.  o Recommend patient utilize chart from daughter to stay on track with consistent dosing.  . Patient self care activities - Over the next 90  days, patient will: o Check BP weekly, document, and provide at future appointments o Ensure daily salt intake < 2300 mg/day  Hypothyroidism . Pharmacist Clinical Goal(s): o Over the next 90 days, patient will work with PharmD and providers to achieve in range TSH  . Current regimen:  o Synthroid 150 mcg daily . Interventions: o Recommend patient take thyroid medication consistently each day at same time.  o Pharmacist coordinating lab appointment with Dr. Henrene Pastor to avoid excess Synthroid if dosage changes at next check.  . Patient self care activities - Over the next 90 days, patient will: o Continue taking Synthroid daily each am 30 minutes prior to food.  Medication management . Pharmacist Clinical Goal(s): o Over the next 90 days, patient will work with PharmD and providers to maintain optimal medication adherence . [facility] . Interventions o Comprehensive medication  review performed. o Continue current medication management strategy . Patient self care activities - Over the next 90 days, patient will: o Focus on medication adherence by using pill box and chart o Take medications as prescribed o Report any questions or concerns to PharmD and/or provider(s)  Please see past updates related to this goal by clicking on the "Past Updates" button in the selected goal         Allergies/Intermittent Asthma   Eosinophil count:  No results found for: EOSPCT%                               Eos (Absolute):  Lab Results  Component Value Date/Time   EOSABS 0.1 12/12/2019 08:48 AM    Tobacco Status:  Social History   Tobacco Use  Smoking Status Former Smoker  . Types: Cigarettes  . Quit date: 31  . Years since quitting: 61.6  Smokeless Tobacco Former Systems developer  . Types: Chew  . Quit date: 2011    Patient has failed these meds in past: Symbicort, Flonase, Trelegy Ellipta, Breo Ellipta, Xopenex nebulizer solution, loratadine, nasonex,  Patient is currently controlled on the following medications:   albuterol prn  fexofenadine 180 mg daily prn  duoneb prn   nasacort nasal spray prn Using maintenance inhaler regularly? No Frequency of rescue inhaler use:  1-2x per week  We discussed:  Patient is well managed on daily allegra. Patient's pulmonologist monitors. He rarely uses prn duoneb nebulizer solution. He does use albuterol inhaler as needed.    Update 12/17/2019 - Used prn nebulizer last week when didn't feel like he was getting enough oxygen. Occasionally uses rescue inhaler. Overall well controlled. Notices worsened breathing issues with hot, muggy days.   Plan  Continue current medications   Hyperlipidemia   Lipid Panel     Component Value Date/Time   CHOL 197 12/12/2019 0848   TRIG 71 12/12/2019 0848   HDL 66 12/12/2019 0848   CHOLHDL 3.0 12/12/2019 0848   LDLCALC 118 (H) 12/12/2019 0848   LABVLDL 13 12/12/2019 0848     The  ASCVD Risk score (Goff DC Jr., et al., 2013) failed to calculate for the following reasons:   The 2013 ASCVD risk score is only valid for ages 73 to 35   Patient has failed these meds in past: simvastatin 40 mg daily Patient is currently controlled on the following medications: n/a  We discussed:  diet and exercise extensively. Patient's LDL was slightly above goal at last labs. Patient continues to remain active outdoors and eating well.   Plan  Continue current medications  Osteopenia / Osteoporosis   Last DEXA Scan: March 2019 - statistically significant decrease from previous scan in 2016. Patient's t-score was -2.9 and was considered osteoporotic.   Patient is a candidate for pharmacologic treatment due to T-Score < -2.5 in femoral neck  Patient has failed these meds in past: oscal 600 mg Patient is currently controlled on the following medications: alendronate 70 mg weekly  We discussed:  Counseled on oral bisphosphonate administration: take in the morning, 30 minutes prior to food with 6-8 oz of water. Do not lie down for at least 30 minutes after taking. Takes on Fridays. Takes on empty stomach. Patient broke his arm March 2021 and healed quickly.  Plan  Continue current medications    Vaccines   Reviewed and discussed patient's vaccination history.  Immunization History  Administered Date(s) Administered  . Influenza Split 02/06/2013, 02/18/2015  . Influenza Whole 02/07/2011, 03/15/2012  . Influenza, High Dose Seasonal PF 02/01/2017, 01/17/2018  . Influenza,inj,Quad PF,6+ Mos 01/07/2014  . PFIZER SARS-COV-2 Vaccination 06/10/2019, 07/01/2019  . Pneumococcal Conjugate-13 12/31/2014  . Pneumococcal Polysaccharide-23 02/07/2011    Plan  Recommended patient receive annual flu vaccine in office.    Hypertension   BP today is:  <140/90  Office blood pressures are  BP Readings from Last 3 Encounters:  12/12/19 136/80  12/05/19 (!) 140/70  11/29/19 (!) 150/62     Patient has failed these meds in the past: n/a Patient is currently controlledon the following medications: metoprolol tartrate 12.5 mg bid  Patient checks BP at home weekly  Patient home BP readings are ranging: 120/70  We discussed diet and exercise extensively Zio patch results today - no change.Patient remains active every day around his home. He eats well but can't maintain/gain weight well so supplementing with Boost for additional protein/calories. Patient has days where he doesn't feel well. Pharmacist requested that he check his blood pressure/pulse and document how he was filling.   Update 10/22/2019 - Patient's daughter called with update on her dad. He is still complaining of dizziness. He is not consistently checking bp to correlate dizziness. Daughter reports that he is likely not taking metoprolol consistently on same schedule. She is making him a chart for medication administration. She is also going to remind him to take pulse and bp during these episodes to report back.   Update 12/17/2019 - Taking metoprolol in the evening now to improve dizziness. BP is 132/65 mm Hg today. Occasionally has dizziness when outdoor and muggy. Overall dizziness seems improved. Patient still drinking boost once daily.   Plan  Continue current medications    Hypothyroidism   TSH  Date Value Ref Range Status  12/12/2019 0.248 (L) 0.450 - 4.500 uIU/mL Final   Patient has failed these meds in past: n/a Patient is currently uncontrolled on the following medications: synthroid 150 mcg daily  We discussed:  Patient has had lots of fluctuations with TSH in the past year. He is consistently taking brand name Synthroid on an empty stomach at least 30 mins prior to all other food or medication.    Update 10/22/2019 - Patient is struggling to take am meds on consistent schedule. Have encouraged patient's daughter that empty stomach and before all other medications is best but consistent  administration is also important. She is working on a chart to help stay consistent with medication administration.   Update 12/17/2019 - Patient's dose of Synthroid is being reduced to 150 mcg daily. They are picking up new  dose today. Pharmacist discussed only filling supply needed to get next blood work as opposed to having excess if dose changed. Pharmacist reached out to Dr. Henrene Pastor to confirm follow-up lab date.   Plan  Continue current medications   Medication Management   [facility] [place] [facility] Uses pill box? Yes - has one but uses the  Pt endorses excellent compliance  We discussed: Patient is taking medications well and leaving them out on dining room table.   Update 10/22/2019 - Patient's daughter is going to try the chart option for improved adherence/compliance. She will consider packaging if continued concern on medication administration.   Plan  Continue current medication management strategy    Follow up: 03/2020

## 2019-12-17 ENCOUNTER — Ambulatory Visit: Payer: Medicare HMO

## 2019-12-17 ENCOUNTER — Other Ambulatory Visit: Payer: Self-pay | Admitting: Legal Medicine

## 2019-12-17 DIAGNOSIS — E039 Hypothyroidism, unspecified: Secondary | ICD-10-CM

## 2019-12-17 DIAGNOSIS — I10 Essential (primary) hypertension: Secondary | ICD-10-CM

## 2019-12-17 NOTE — Patient Instructions (Signed)
Visit Information  Goals Addressed            This Visit's Progress   . Pharmacy Care Plan       CARE PLAN ENTRY  Current Barriers:  . Chronic Disease Management support, education, and care coordination needs related to Hypertension and Hypothyroidism   Hypertension . Pharmacist Clinical Goal(s): o Over the next 90 days, patient will work with PharmD and providers to maintain BP goal <140/90 . Current regimen:  o Metoprolol 25 mg daily . Interventions: o Patient resumed metorprolol 25 mg dosing once daily in the evening.  o Recommend patient utilize chart from daughter to stay on track with consistent dosing.  . Patient self care activities - Over the next 90 days, patient will: o Check BP weekly, document, and provide at future appointments o Ensure daily salt intake < 2300 mg/day  Hypothyroidism . Pharmacist Clinical Goal(s): o Over the next 90 days, patient will work with PharmD and providers to achieve in range TSH  . Current regimen:  o Synthroid 150 mcg daily . Interventions: o Recommend patient take thyroid medication consistently each day at same time.  o Pharmacist coordinating lab appointment with Dr. Henrene Pastor to avoid excess Synthroid if dosage changes at next check.  . Patient self care activities - Over the next 90 days, patient will: o Continue taking Synthroid daily each am 30 minutes prior to food.  Medication management . Pharmacist Clinical Goal(s): o Over the next 90 days, patient will work with PharmD and providers to maintain optimal medication adherence . [facility] . Interventions o Comprehensive medication review performed. o Continue current medication management strategy . Patient self care activities - Over the next 90 days, patient will: o Focus on medication adherence by using pill box and chart o Take medications as prescribed o Report any questions or concerns to PharmD and/or provider(s)  Please see past updates related to this  goal by clicking on the "Past Updates" button in the selected goal         The patient verbalized understanding of instructions provided today and declined a print copy of patient instruction materials.   Telephone follow up appointment with pharmacy team member scheduled for: 03/2020  Sherre Poot, PharmD, [facility] Clinical Pharmacist [facility] [phone] (office) [phone] (mobile)

## 2019-12-18 ENCOUNTER — Other Ambulatory Visit: Payer: Self-pay

## 2019-12-18 DIAGNOSIS — E039 Hypothyroidism, unspecified: Secondary | ICD-10-CM

## 2020-01-04 ENCOUNTER — Other Ambulatory Visit: Payer: Self-pay | Admitting: Internal Medicine

## 2020-01-10 ENCOUNTER — Other Ambulatory Visit: Payer: Medicare HMO

## 2020-01-10 DIAGNOSIS — E039 Hypothyroidism, unspecified: Secondary | ICD-10-CM | POA: Diagnosis not present

## 2020-01-11 LAB — TSH: TSH: 1.46 u[IU]/mL (ref 0.450–4.500)

## 2020-01-11 NOTE — Progress Notes (Signed)
TSH now  normal 1.46 lp

## 2020-01-14 ENCOUNTER — Other Ambulatory Visit: Payer: Self-pay

## 2020-01-14 MED ORDER — METOPROLOL SUCCINATE ER 25 MG PO TB24: 25.0000 mg | ORAL_TABLET | Freq: Every day | ORAL | 2 refills | Status: DC

## 2020-01-14 NOTE — Telephone Encounter (Signed)
[facility]

## 2020-01-15 ENCOUNTER — Other Ambulatory Visit: Payer: Self-pay

## 2020-01-21 ENCOUNTER — Encounter: Payer: Self-pay | Admitting: Legal Medicine

## 2020-01-21 ENCOUNTER — Other Ambulatory Visit: Payer: Self-pay

## 2020-01-21 ENCOUNTER — Ambulatory Visit (INDEPENDENT_AMBULATORY_CARE_PROVIDER_SITE_OTHER): Payer: Medicare HMO | Admitting: Legal Medicine

## 2020-01-21 DIAGNOSIS — J4489 Other specified chronic obstructive pulmonary disease: Secondary | ICD-10-CM | POA: Insufficient documentation

## 2020-01-21 DIAGNOSIS — J449 Chronic obstructive pulmonary disease, unspecified: Secondary | ICD-10-CM | POA: Diagnosis not present

## 2020-01-21 DIAGNOSIS — J4540 Moderate persistent asthma, uncomplicated: Secondary | ICD-10-CM | POA: Diagnosis not present

## 2020-01-21 HISTORY — DX: Moderate persistent asthma, uncomplicated: J45.40

## 2020-01-21 HISTORY — DX: Chronic obstructive pulmonary disease, unspecified: J44.9

## 2020-01-21 NOTE — Progress Notes (Signed)
Acute Office Visit  Subjective:    Patient ID: James Peace Sr., male    DOB: 1932-02-08, 86 y.o.   MRN: [id number]  Chief Complaint  Patient presents with  . Asthma    HPI Patient is in today for Chronic asthma  Patient has moderate intermittant asthma uncomplicated.  Asthma was diagnosed adult and has occasional daytime episodes and occasional night symptoms.  Patient is not having an attack and is using duoneb 2 times a week.  Patient does not smoke.  none  Past Medical History:  Diagnosis Date  . Allergic rhinitis   . History of prostate cancer    seed implant  . Hyperlipemia   . Mild asthma   . Rhinosinusitis     Past Surgical History:  Procedure Laterality Date  . APPENDECTOMY      Family History  Problem Relation Age of Onset  . Alzheimer's disease Father   . Diabetes Mother     Social History   Socioeconomic History  . Marital status: Married    Spouse name: Not on file  . Number of children: Not on file  . Years of education: Not on file  . Highest education level: Not on file  Occupational History  . Occupation: Retired    Fish farm manager: OTHER    Comment: Scientist, research (life sciences)  Tobacco Use  . Smoking status: Former Smoker    Types: Cigarettes    Quit date: 1960    Years since quitting: 61.7  . Smokeless tobacco: Former Systems developer    Types: [place] date: 2011  Vaping Use  . Vaping Use: Never used  Substance and Sexual Activity  . Alcohol use: No  . Drug use: No  . Sexual activity: Not Currently  Other Topics Concern  . Not on file  Social History Narrative  . Not on file   Social Determinants of Health   Financial Resource Strain:   . Difficulty of Paying Living Expenses: Not on file  Food Insecurity:   . Worried About Charity fundraiser in the Last Year: Not on file  . Ran Out of Food in the Last Year: Not on file  Transportation Needs: No Transportation Needs  . Lack of Transportation (Medical): No  . Lack of Transportation  (Non-Medical): No  Physical Activity: Sufficiently Active  . Days of Exercise per Week: 6 days  . Minutes of Exercise per Session: 30 min  Stress:   . Feeling of Stress : Not on file  Social Connections:   . Frequency of Communication with Friends and Family: Not on file  . Frequency of Social Gatherings with Friends and Family: Not on file  . Attends Religious Services: Not on file  . Active Member of Clubs or Organizations: Not on file  . Attends Archivist Meetings: Not on file  . Marital Status: Not on file  Intimate Partner Violence:   . Fear of Current or Ex-Partner: Not on file  . Emotionally Abused: Not on file  . Physically Abused: Not on file  . Sexually Abused: Not on file    Outpatient Medications Prior to Visit  Medication Sig Dispense Refill  . albuterol (VENTOLIN HFA) 108 (90 Base) MCG/ACT inhaler INHALE 2 PUFFS INTO THE LUNGS EVERY 6 HOURS AS NEEDED FOR WHEEZING OR SHORTNESS OF BREATH 18 g 6  . alendronate (FOSAMAX) 70 MG tablet TAKE 1 TABLET BY MOUTH ONCE WEEKLY IN THE MORNING, AT LEAST 30 MIN BEFORE FIRST FOOD, BEVERAGE, OR  MEDICATION OF DAY 12 tablet 4  . Fexofenadine HCl (ALLEGRA PO) Take daily as needed by mouth.    . hydrOXYzine (ATARAX/VISTARIL) 25 MG tablet Take 1 tablet (25 mg total) by mouth 3 (three) times daily as needed. 30 tablet 3  . ipratropium-albuterol (DUONEB) 0.5-2.5 (3) MG/3ML SOLN Take 3 mLs by nebulization every 4 (four) hours as needed.    Marland Kitchen levothyroxine (SYNTHROID) 150 MCG tablet Take 1 tablet (150 mcg total) by mouth daily before breakfast. 90 tablet 2  . metoprolol succinate (TOPROL-XL) 25 MG 24 hr tablet Take 1 tablet (25 mg total) by mouth daily. Take with or immediately following a meal. 90 tablet 2  . metoprolol tartrate (LOPRESSOR) 25 MG tablet Take 25 mg by mouth daily.    . Multiple Vitamins-Minerals (MULTIVITAMIN ADULT PO) Take 1 tablet by mouth daily.     . Nutritional Supplements (BOOST NUTRITIONAL ENERGY PO) Take by  mouth. Drinking 1-2 per day     No facility-administered medications prior to visit.    Allergies  Allergen Reactions  . Clarithromycin     REACTION: GI upset    Review of Systems  Constitutional: Negative.   HENT: Negative.   Eyes: Negative.   Respiratory: Positive for shortness of breath and wheezing.   Cardiovascular: Negative for chest pain, palpitations and leg swelling.       Objective:    Physical Exam Vitals reviewed.  Constitutional:      Appearance: Normal appearance.  Eyes:     Extraocular Movements: Extraocular movements intact.     Conjunctiva/sclera: Conjunctivae normal.     Pupils: Pupils are equal, round, and reactive to light.  Cardiovascular:     Rate and Rhythm: Normal rate and regular rhythm.     Pulses: Normal pulses.     Heart sounds: Normal heart sounds.  Pulmonary:     Effort: Pulmonary effort is normal.     Breath sounds: Normal breath sounds.  Abdominal:     General: Abdomen is flat. Bowel sounds are normal.  Musculoskeletal:        General: Normal range of motion.  Skin:    General: Skin is warm.     BP 104/60   Pulse 69   Temp (!) 96.7 F (35.9 C)   Ht 6' (1.829 m)   Wt 123 lb 9.6 oz (56.1 kg)   SpO2 97%   BMI 16.76 kg/m  Wt Readings from Last 3 Encounters:  01/21/20 123 lb 9.6 oz (56.1 kg)  12/12/19 122 lb 3.2 oz (55.4 kg)  12/05/19 125 lb 6.4 oz (56.9 kg)    Health Maintenance Due  Topic Date Due  . TETANUS/TDAP  Never done  . INFLUENZA VACCINE  12/08/2019    There are no preventive care reminders to display for this patient.   Lab Results  Component Value Date   TSH 1.460 01/10/2020   Lab Results  Component Value Date   WBC 12.3 (H) 12/12/2019   HGB 11.6 (L) 12/12/2019   HCT 35.1 (L) 12/12/2019   MCV 86 12/12/2019   PLT 343 12/12/2019   Lab Results  Component Value Date   NA 133 (L) 12/12/2019   K 4.8 12/12/2019   CO2 21 12/12/2019   GLUCOSE 92 12/12/2019   BUN 29 (H) 12/12/2019   CREATININE 0.91  12/12/2019   BILITOT 0.4 12/12/2019   ALKPHOS 51 12/12/2019   AST 20 12/12/2019   ALT 17 12/12/2019   PROT 7.1 12/12/2019   ALBUMIN 4.5 12/12/2019  CALCIUM 9.4 12/12/2019   Lab Results  Component Value Date   CHOL 197 12/12/2019   Lab Results  Component Value Date   HDL 66 12/12/2019   Lab Results  Component Value Date   LDLCALC 118 (H) 12/12/2019   Lab Results  Component Value Date   TRIG 71 12/12/2019   Lab Results  Component Value Date   CHOLHDL 3.0 12/12/2019   No results found for: HGBA1C     Assessment & Plan:  1. Asthma, mild intermittent, well-controlled  Patient needs new nebulizer equipment and duoneb from [place].  He is doing well on this and it is needed to prevent further deterioration of his respiratory disease.      Follow-up: No follow-ups on file.  An After Visit Summary was printed and given to the patient.  [place] [phone]

## 2020-02-04 ENCOUNTER — Other Ambulatory Visit: Payer: Self-pay

## 2020-02-04 ENCOUNTER — Ambulatory Visit (INDEPENDENT_AMBULATORY_CARE_PROVIDER_SITE_OTHER): Payer: Medicare HMO | Admitting: Physician Assistant

## 2020-02-04 ENCOUNTER — Encounter: Payer: Self-pay | Admitting: Physician Assistant

## 2020-02-04 VITALS — BP 106/64 | HR 61 | Temp 97.6°F | Ht 72.0 in | Wt 125.0 lb

## 2020-02-04 DIAGNOSIS — L309 Dermatitis, unspecified: Secondary | ICD-10-CM

## 2020-02-04 DIAGNOSIS — R5383 Other fatigue: Secondary | ICD-10-CM | POA: Diagnosis not present

## 2020-02-04 DIAGNOSIS — D649 Anemia, unspecified: Secondary | ICD-10-CM | POA: Diagnosis not present

## 2020-02-04 DIAGNOSIS — R17 Unspecified jaundice: Secondary | ICD-10-CM | POA: Diagnosis not present

## 2020-02-04 DIAGNOSIS — R410 Disorientation, unspecified: Secondary | ICD-10-CM | POA: Diagnosis not present

## 2020-02-04 DIAGNOSIS — R42 Dizziness and giddiness: Secondary | ICD-10-CM | POA: Diagnosis not present

## 2020-02-04 LAB — BASIC METABOLIC PANEL
BUN: 11 (ref 4–21)
CO2: 26 — AB (ref 13–22)
Chloride: 103 (ref 99–108)
Creatinine: 0.9 (ref 0.6–1.3)
Glucose: 99
Potassium: 4.3 (ref 3.4–5.3)
Sodium: 138 (ref 137–147)

## 2020-02-04 LAB — CBC: RBC: 4.03 (ref 3.87–5.11)

## 2020-02-04 LAB — HEPATIC FUNCTION PANEL
ALT: 21 (ref 10–40)
AST: 30 (ref 14–40)
Alkaline Phosphatase: 59 (ref 25–125)
Bilirubin, Direct: 0.1 (ref 0.01–0.4)

## 2020-02-04 LAB — CBC AND DIFFERENTIAL
HCT: 34 — AB (ref 41–53)
Hemoglobin: 11.3 — AB (ref 13.5–17.5)
Neutrophils Absolute: 58.4
Platelets: 262 (ref 150–399)
WBC: 8.3

## 2020-02-04 LAB — LAB REPORT - SCANNED
Ammonia: <9
LACTIC ACID: 0.7

## 2020-02-04 LAB — COMPREHENSIVE METABOLIC PANEL
Albumin: 4.2 (ref 3.5–5.0)
Calcium: 9.7 (ref 8.7–10.7)

## 2020-02-04 MED ORDER — PREDNISONE 10 MG (21) PO TBPK
ORAL_TABLET | ORAL | 0 refills | Status: DC
Start: 2020-02-04 — End: 2020-03-19

## 2020-02-05 ENCOUNTER — Encounter: Payer: Self-pay | Admitting: Physician Assistant

## 2020-02-05 DIAGNOSIS — L309 Dermatitis, unspecified: Secondary | ICD-10-CM

## 2020-02-05 DIAGNOSIS — R531 Weakness: Secondary | ICD-10-CM | POA: Insufficient documentation

## 2020-02-05 DIAGNOSIS — R5383 Other fatigue: Secondary | ICD-10-CM

## 2020-02-05 DIAGNOSIS — D649 Anemia, unspecified: Secondary | ICD-10-CM | POA: Insufficient documentation

## 2020-02-05 HISTORY — DX: Dermatitis, unspecified: L30.9

## 2020-02-05 HISTORY — DX: Anemia, unspecified: D64.9

## 2020-02-05 HISTORY — DX: Other fatigue: R53.83

## 2020-02-05 MED ORDER — TRIAMCINOLONE ACETONIDE 40 MG/ML IJ SUSP: 60.0000 mg | Freq: Once | INTRAMUSCULAR | Status: DC

## 2020-02-05 NOTE — Assessment & Plan Note (Signed)
Prednisone pack given Stop atarax but continue allegra Kenalog 60 given IM

## 2020-02-05 NOTE — Progress Notes (Signed)
Acute Office Visit  Subjective:    Patient ID: James Heider Sr., male    DOB: 08-Aug-1931, 86 y.o.   MRN: [id number]  Chief Complaint  Patient presents with  . Rash    Back    HPI Patient is in today for rash, itching and fatigue Pt step daughter with him today - states he has had a rash on his back for over a month - red, raised and very pruritic - he had been put on atarax but she thinks that could be causing him to feel fatigued and feel bad  Pt had labwork before coming for visit because family called and said pt was jaundiced and itching - labwork which was done is stable except a low hemoglobin - daughter does not know if he has been worked up for anemia - so I discussed adding on iron studies to his labs (order sent to hospital) and mentioned hemoccult cards but daughter said he would not be able to do those As far as his coloring he looks normal today in office  Past Medical History:  Diagnosis Date  . COPD (chronic obstructive pulmonary disease) ([place]) 01/21/2020  . History of prostate cancer    seed implant  . Hyperlipemia   . Moderate persistent asthma, uncomplicated 6/64/4034    Past Surgical History:  Procedure Laterality Date  . APPENDECTOMY      Family History  Problem Relation Age of Onset  . Alzheimer's disease Father   . Diabetes Mother     Social History   Socioeconomic History  . Marital status: Married    Spouse name: Not on file  . Number of children: Not on file  . Years of education: Not on file  . Highest education level: Not on file  Occupational History  . Occupation: Retired    Fish farm manager: OTHER    Comment: Scientist, research (life sciences)  Tobacco Use  . Smoking status: Former Smoker    Types: Cigarettes    Quit date: 1960    Years since quitting: 61.7  . Smokeless tobacco: Former Systems developer    Types: [place] date: 2011  Vaping Use  . Vaping Use: Never used  Substance and Sexual Activity  . Alcohol use: No  . Drug use: No  . Sexual  activity: Not Currently  Other Topics Concern  . Not on file  Social History Narrative  . Not on file   Social Determinants of Health   Financial Resource Strain:   . Difficulty of Paying Living Expenses: Not on file  Food Insecurity:   . Worried About Charity fundraiser in the Last Year: Not on file  . Ran Out of Food in the Last Year: Not on file  Transportation Needs: No Transportation Needs  . Lack of Transportation (Medical): No  . Lack of Transportation (Non-Medical): No  Physical Activity: Sufficiently Active  . Days of Exercise per Week: 6 days  . Minutes of Exercise per Session: 30 min  Stress:   . Feeling of Stress : Not on file  Social Connections:   . Frequency of Communication with Friends and Family: Not on file  . Frequency of Social Gatherings with Friends and Family: Not on file  . Attends Religious Services: Not on file  . Active Member of Clubs or Organizations: Not on file  . Attends Archivist Meetings: Not on file  . Marital Status: Not on file  Intimate Partner Violence:   . Fear of Current  or Ex-Partner: Not on file  . Emotionally Abused: Not on file  . Physically Abused: Not on file  . Sexually Abused: Not on file     Current Outpatient Medications:  .  albuterol (VENTOLIN HFA) 108 (90 Base) MCG/ACT inhaler, INHALE 2 PUFFS INTO THE LUNGS EVERY 6 HOURS AS NEEDED FOR WHEEZING OR SHORTNESS OF BREATH, Disp: 18 g, Rfl: 6 .  Fexofenadine HCl (ALLEGRA PO), Take daily as needed by mouth., Disp: , Rfl:  .  hydrOXYzine (ATARAX/VISTARIL) 25 MG tablet, Take 1 tablet (25 mg total) by mouth 3 (three) times daily as needed., Disp: 30 tablet, Rfl: 3 .  ipratropium-albuterol (DUONEB) 0.5-2.5 (3) MG/3ML SOLN, Take 3 mLs by nebulization every 4 (four) hours as needed., Disp: , Rfl:  .  levothyroxine (SYNTHROID) 150 MCG tablet, Take 1 tablet (150 mcg total) by mouth daily before breakfast., Disp: 90 tablet, Rfl: 2 .  metoprolol succinate (TOPROL-XL) 25 MG 24  hr tablet, Take 1 tablet (25 mg total) by mouth daily. Take with or immediately following a meal., Disp: 90 tablet, Rfl: 2 .  Multiple Vitamins-Minerals (MULTIVITAMIN ADULT PO), Take 1 tablet by mouth daily. , Disp: , Rfl:  .  Nutritional Supplements (BOOST NUTRITIONAL ENERGY PO), Take by mouth. Drinking 1-2 per day, Disp: , Rfl:  .  predniSONE (STERAPRED UNI-PAK 21 TAB) 10 MG (21) TBPK tablet, To take as directed, Disp: 1 each, Rfl: 0   Allergies  Allergen Reactions  . Clarithromycin     REACTION: GI upset    CONSTITUTIONAL: mild malaise E/N/T: Negative for ear pain, nasal congestion and sore throat.  CARDIOVASCULAR: Negative for chest pain, dizziness, palpitations and pedal edema.  RESPIRATORY: Negative for recent cough and dyspnea.  GASTROINTESTINAL: Negative for abdominal pain, acid reflux symptoms, constipation, diarrhea, nausea and vomiting.  MSK: Negative for arthralgias and myalgias.  INTEGUMENTARY: see HPI PSYCHIATRIC: Negative for sleep disturbance and to question depression screen.  Negative for depression, negative for anhedonia.         Objective:    PHYSICAL EXAM:   VS: BP 106/64 (BP Location: Left Arm, Patient Position: Sitting, Cuff Size: Normal)   Pulse 61   Temp 97.6 F (36.4 C) (Temporal)   Ht 6' (1.829 m)   Wt 125 lb (56.7 kg)   SpO2 98%   BMI 16.95 kg/m   GEN: Well nourished, well developed, in no acute distress  HEENT: normal external ears and nose - normal external auditory canals and TMS -- Lips, Teeth and Gums - normal  Oropharynx - normal mucosa, palate, and posterior pharynx  cardiac: RRR; no murmurs, rubs, or gallops,no edema -  Respiratory:  normal respiratory rate and pattern with no distress - normal breath sounds with no rales, rhonchi, wheezes or rubs Skin: erythematous macular rash across entire back Psych: euthymic mood, appropriate affect and demeanor   Wt Readings from Last 3 Encounters:  02/04/20 125 lb (56.7 kg)  01/21/20 123 lb  9.6 oz (56.1 kg)  12/12/19 122 lb 3.2 oz (55.4 kg)    Health Maintenance Due  Topic Date Due  . TETANUS/TDAP  Never done  . INFLUENZA VACCINE  12/08/2019    There are no preventive care reminders to display for this patient.        Assessment & Plan:   Problem List Items Addressed This Visit      Musculoskeletal and Integument   Dermatitis     Other   Other fatigue - Primary   Anemia  Meds ordered this encounter  Medications  . predniSONE (STERAPRED UNI-PAK 21 TAB) 10 MG (21) TBPK tablet    Sig: To take as directed    Dispense:  1 each    Refill:  0    Order Specific Question:   Supervising Provider    Answer:   Shelton Silvas     Amaya, PA-C

## 2020-02-05 NOTE — Assessment & Plan Note (Signed)
Will add iron studies to labwork Follow up in 3 weeks with Dr Henrene Pastor

## 2020-02-05 NOTE — Assessment & Plan Note (Signed)
Iron studies added

## 2020-02-25 ENCOUNTER — Encounter: Payer: Self-pay | Admitting: Legal Medicine

## 2020-02-25 ENCOUNTER — Ambulatory Visit (INDEPENDENT_AMBULATORY_CARE_PROVIDER_SITE_OTHER): Payer: Medicare HMO | Admitting: Legal Medicine

## 2020-02-25 ENCOUNTER — Other Ambulatory Visit: Payer: Self-pay

## 2020-02-25 VITALS — BP 112/60 | HR 63 | Temp 97.6°F | Ht 72.0 in | Wt 124.0 lb

## 2020-02-25 DIAGNOSIS — J449 Chronic obstructive pulmonary disease, unspecified: Secondary | ICD-10-CM

## 2020-02-25 DIAGNOSIS — I10 Essential (primary) hypertension: Secondary | ICD-10-CM | POA: Diagnosis not present

## 2020-02-25 DIAGNOSIS — E782 Mixed hyperlipidemia: Secondary | ICD-10-CM

## 2020-02-25 DIAGNOSIS — E039 Hypothyroidism, unspecified: Secondary | ICD-10-CM | POA: Diagnosis not present

## 2020-02-25 DIAGNOSIS — E440 Moderate protein-calorie malnutrition: Secondary | ICD-10-CM

## 2020-02-25 NOTE — Progress Notes (Signed)
Subjective:  Patient ID: James Iha Sr., male    DOB: November 17, 1931  Age: 86 y.o. MRN: [id number]  Chief Complaint  Patient presents with  . Fatigue    3 week follow up-Pt asking about covid booster Maudie Mercury said ok to schedule this thurs. or next Thurs    HPI: patient was seen in ER last week.  We need records. All labs from [facility] were normal, normal bilirubin.  He has anemia of chronic disease..  Last check up 6 months a go.  His last TSH now normal. He denies andy fatigue.  His son brought him in today.  Patient presents for follow up of hypertension.  Patient tolerating metoprolol well with side effects.  Patient was diagnosed with hypertension 2010 so has been treated for hypertension for 10 years.Patient is working on maintaining diet and exercise regimen and follows up as directed. Complication include none.  Patient presents with hyperlipidemia.  Compliance with treatment has been good; patient takes medicines as directed, maintains low cholesterol diet, follows up as directed, and maintains exercise regimen.  Patient is using none now without problems.   Current Outpatient Medications on File Prior to Visit  Medication Sig Dispense Refill  . albuterol (VENTOLIN HFA) 108 (90 Base) MCG/ACT inhaler INHALE 2 PUFFS INTO THE LUNGS EVERY 6 HOURS AS NEEDED FOR WHEEZING OR SHORTNESS OF BREATH 18 g 6  . Fexofenadine HCl (ALLEGRA PO) Take daily as needed by mouth.    Marland Kitchen ipratropium-albuterol (DUONEB) 0.5-2.5 (3) MG/3ML SOLN Take 3 mLs by nebulization every 4 (four) hours as needed.    Marland Kitchen levothyroxine (SYNTHROID) 150 MCG tablet Take 1 tablet (150 mcg total) by mouth daily before breakfast. 90 tablet 2  . metoprolol succinate (TOPROL-XL) 25 MG 24 hr tablet Take 1 tablet (25 mg total) by mouth daily. Take with or immediately following a meal. 90 tablet 2  . Multiple Vitamins-Minerals (MULTIVITAMIN ADULT PO) Take 1 tablet by mouth daily.     . Nutritional Supplements (BOOST  NUTRITIONAL ENERGY PO) Take by mouth. Drinking 1-2 per day    . predniSONE (STERAPRED UNI-PAK 21 TAB) 10 MG (21) TBPK tablet To take as directed 1 each 0   Current Facility-Administered Medications on File Prior to Visit  Medication Dose Route Frequency Provider Last Rate Last Admin  . triamcinolone acetonide (KENALOG-40) injection 60 mg  60 mg Intramuscular Once Marge Duncans, PA-C       Past Medical History:  Diagnosis Date  . COPD (chronic obstructive pulmonary disease) ([place]) 01/21/2020  . History of prostate cancer    seed implant  . Hyperlipemia   . Moderate persistent asthma, uncomplicated 0/76/2263   Past Surgical History:  Procedure Laterality Date  . APPENDECTOMY      Family History  Problem Relation Age of Onset  . Alzheimer's disease Father   . Diabetes Mother    Social History   Socioeconomic History  . Marital status: Married    Spouse name: Not on file  . Number of children: Not on file  . Years of education: Not on file  . Highest education level: Not on file  Occupational History  . Occupation: Retired    Fish farm manager: OTHER    Comment: Scientist, research (life sciences)  Tobacco Use  . Smoking status: Former Smoker    Types: Cigarettes    Quit date: 1960    Years since quitting: 61.8  . Smokeless tobacco: Former Systems developer    Types: [place]    Quit date: 2011  Vaping Use  . Vaping Use: Never used  Substance and Sexual Activity  . Alcohol use: No  . Drug use: No  . Sexual activity: Not Currently  Other Topics Concern  . Not on file  Social History Narrative  . Not on file   Social Determinants of Health   Financial Resource Strain:   . Difficulty of Paying Living Expenses: Not on file  Food Insecurity:   . Worried About Charity fundraiser in the Last Year: Not on file  . Ran Out of Food in the Last Year: Not on file  Transportation Needs: No Transportation Needs  . Lack of Transportation (Medical): No  . Lack of Transportation (Non-Medical): No  Physical Activity:  Sufficiently Active  . Days of Exercise per Week: 6 days  . Minutes of Exercise per Session: 30 min  Stress:   . Feeling of Stress : Not on file  Social Connections:   . Frequency of Communication with Friends and Family: Not on file  . Frequency of Social Gatherings with Friends and Family: Not on file  . Attends Religious Services: Not on file  . Active Member of Clubs or Organizations: Not on file  . Attends Archivist Meetings: Not on file  . Marital Status: Not on file    Review of Systems  Constitutional: Negative.  Negative for activity change and appetite change.  HENT: Negative.   Eyes: Negative.   Respiratory: Negative for cough and shortness of breath.   Cardiovascular: Negative for chest pain, palpitations and leg swelling.  Gastrointestinal: Negative.   Genitourinary: Negative.   Musculoskeletal: Negative.   Skin: Negative.   Neurological: Negative.   Psychiatric/Behavioral: Negative.      Objective:  BP 112/60   Pulse 63   Temp 97.6 F (36.4 C)   Ht 6' (1.829 m)   Wt 124 lb (56.2 kg)   SpO2 97%   BMI 16.82 kg/m   BP/Weight 02/25/2020 02/04/2020 0/38/8828  Systolic BP [phone]  Diastolic BP 60 64 60  Wt. (Lbs) 124 125 123.6  BMI 16.82 16.95 16.76    Physical Exam Vitals reviewed.  Constitutional:      Appearance: Normal appearance.  HENT:     Head: Normocephalic and atraumatic.     Right Ear: Tympanic membrane, ear canal and external ear normal.     Left Ear: Tympanic membrane, ear canal and external ear normal.     Nose: Nose normal.     Mouth/Throat:     Mouth: Mucous membranes are moist.     Pharynx: Oropharynx is clear.  Eyes:     Conjunctiva/sclera: Conjunctivae normal.     Pupils: Pupils are equal, round, and reactive to light.  Cardiovascular:     Rate and Rhythm: Normal rate and regular rhythm.     Pulses: Normal pulses.     Heart sounds: Normal heart sounds.  Pulmonary:     Effort: Pulmonary effort is normal.      Breath sounds: Normal breath sounds.  Abdominal:     General: Abdomen is flat.     Palpations: Abdomen is soft.  Musculoskeletal:     Cervical back: Normal range of motion and neck supple.  Skin:    General: Skin is warm and dry.     Capillary Refill: Capillary refill takes less than 2 seconds.  Neurological:     General: No focal deficit present.     Mental Status: He is alert and oriented to person, place,  and time.  Psychiatric:        Mood and Affect: Mood normal.        Thought Content: Thought content normal.       Lab Results  Component Value Date   WBC 12.3 (H) 12/12/2019   HGB 11.6 (L) 12/12/2019   HCT 35.1 (L) 12/12/2019   PLT 343 12/12/2019   GLUCOSE 92 12/12/2019   CHOL 197 12/12/2019   TRIG 71 12/12/2019   HDL 66 12/12/2019   LDLCALC 118 (H) 12/12/2019   ALT 17 12/12/2019   AST 20 12/12/2019   NA 133 (L) 12/12/2019   K 4.8 12/12/2019   CL 99 12/12/2019   CREATININE 0.91 12/12/2019   BUN 29 (H) 12/12/2019   CO2 21 12/12/2019   TSH 1.460 01/10/2020      Assessment & Plan:   1. Essential hypertension An individual hypertension care plan was established and reinforced today.  The patient's status was assessed using clinical findings on exam and labs or diagnostic tests. The patient's success at meeting treatment goals on disease specific evidence-based guidelines and found to be well controlled. SELF MANAGEMENT: The patient and I together assessed ways to personally work towards obtaining the recommended goals. RECOMMENDATIONS: avoid decongestants found in common cold remedies, decrease consumption of alcohol, perform routine monitoring of BP with home BP cuff, exercise, reduction of dietary salt, take medicines as prescribed, try not to miss doses and quit smoking.  Regular exercise and maintaining a healthy weight is needed.  Stress reduction may help. A CLINICAL SUMMARY including written plan identify barriers to care unique to individual due to social or  financial issues.  We attempt to mutually creat solutions for individual and family understanding.  2. Chronic obstructive pulmonary disease, unspecified COPD type ([place]) An individualize plan was formulated for care of COPD.  Treatment is evidence based.  She will continue on inhalers, avoid smoking and smoke.  Regular exercise with help with dyspnea. Routine follow ups and medication compliance is needed.  3. Acquired hypothyroidism Patient is known to have hypothyroidism and is on treatment with levothyroxine 173mcg.  Patient was diagnosed 10 years ago.  Other treatment includes none.  Patient is compliant with medicines and last TSH 6 months ago.  Last TSH was normal.  4. Hyperlipidemia, mixed AN INDIVIDUAL CARE PLAN for hyperlipidemia/ cholesterol was established and reinforced today.  The patient's status was assessed using clinical findings on exam, lab and other diagnostic tests. The patient's disease status was assessed based on evidence-based guidelines and found to be well controlled. MEDICATIONS were reviewed. SELF MANAGEMENT GOALS have been discussed and patient's success at attaining the goal of low cholesterol was assessed. RECOMMENDATION given include regular exercise 3 days a week and low cholesterol/low fat diet. CLINICAL SUMMARY including written plan to identify barriers unique to the patient due to social or economic  reasons was discussed.  5. Malnutrition of moderate degree ([facility]) Supplement nutrition with protein/calorie supplement with meals to improve nutritional status.         Follow-up: Return in about 6 months (around 08/25/2020) for fasting.  An After Visit Summary was printed and given to the patient.  [facility] [phone]

## 2020-02-26 DIAGNOSIS — R69 Illness, unspecified: Secondary | ICD-10-CM | POA: Diagnosis not present

## 2020-03-02 DIAGNOSIS — R69 Illness, unspecified: Secondary | ICD-10-CM | POA: Diagnosis not present

## 2020-03-10 DIAGNOSIS — M810 Age-related osteoporosis without current pathological fracture: Secondary | ICD-10-CM | POA: Diagnosis not present

## 2020-03-10 DIAGNOSIS — G3184 Mild cognitive impairment, so stated: Secondary | ICD-10-CM | POA: Diagnosis not present

## 2020-03-10 DIAGNOSIS — Z008 Encounter for other general examination: Secondary | ICD-10-CM | POA: Diagnosis not present

## 2020-03-10 DIAGNOSIS — E039 Hypothyroidism, unspecified: Secondary | ICD-10-CM | POA: Diagnosis not present

## 2020-03-10 DIAGNOSIS — Z791 Long term (current) use of non-steroidal anti-inflammatories (NSAID): Secondary | ICD-10-CM | POA: Diagnosis not present

## 2020-03-10 DIAGNOSIS — M1990 Unspecified osteoarthritis, unspecified site: Secondary | ICD-10-CM | POA: Diagnosis not present

## 2020-03-10 DIAGNOSIS — K219 Gastro-esophageal reflux disease without esophagitis: Secondary | ICD-10-CM | POA: Diagnosis not present

## 2020-03-10 DIAGNOSIS — R030 Elevated blood-pressure reading, without diagnosis of hypertension: Secondary | ICD-10-CM | POA: Diagnosis not present

## 2020-03-10 DIAGNOSIS — J45909 Unspecified asthma, uncomplicated: Secondary | ICD-10-CM | POA: Diagnosis not present

## 2020-03-10 DIAGNOSIS — I471 Supraventricular tachycardia: Secondary | ICD-10-CM | POA: Diagnosis not present

## 2020-03-10 DIAGNOSIS — Z7983 Long term (current) use of bisphosphonates: Secondary | ICD-10-CM | POA: Diagnosis not present

## 2020-03-18 ENCOUNTER — Other Ambulatory Visit: Payer: Self-pay | Admitting: Legal Medicine

## 2020-03-18 DIAGNOSIS — L2081 Atopic neurodermatitis: Secondary | ICD-10-CM

## 2020-03-19 ENCOUNTER — Ambulatory Visit (INDEPENDENT_AMBULATORY_CARE_PROVIDER_SITE_OTHER): Payer: Medicare HMO | Admitting: Legal Medicine

## 2020-03-19 ENCOUNTER — Other Ambulatory Visit: Payer: Self-pay

## 2020-03-19 ENCOUNTER — Encounter: Payer: Self-pay | Admitting: Legal Medicine

## 2020-03-19 VITALS — BP 122/62 | HR 68 | Temp 97.0°F | Resp 17 | Ht 72.0 in | Wt 126.2 lb

## 2020-03-19 DIAGNOSIS — L2081 Atopic neurodermatitis: Secondary | ICD-10-CM | POA: Diagnosis not present

## 2020-03-19 MED ORDER — TRIAMCINOLONE ACETONIDE 40 MG/ML IJ SUSP
80.0000 mg | Freq: Once | INTRAMUSCULAR | Status: AC
  Administered 2020-03-19: 80 mg via INTRAMUSCULAR

## 2020-03-19 MED ORDER — PREDNISONE 10 MG (21) PO TBPK: ORAL_TABLET | ORAL | 3 refills | Status: DC

## 2020-03-19 NOTE — Progress Notes (Signed)
Subjective:  Patient ID: James Iha Sr., male    DOB: May 19, 1931  Age: 86 y.o. MRN: [id number]  Chief Complaint  Patient presents with  . Rash    Right side of upper body. rash itches     HPI : dry skin with rash for 2 weeks.  He showers every day and is not drinking much fluids.  He has had atopic dermatitis every winter for the last several years.  He says he is using Eucerin that helped.  I discussed with his daughter who is present about using thicker emollient including petroleum jelly at night.  We discussed different ways of rehydrating the skin.  He is already on an nonsedating antihistamine. Current Outpatient Medications on File Prior to Visit  Medication Sig Dispense Refill  . albuterol (VENTOLIN HFA) 108 (90 Base) MCG/ACT inhaler INHALE 2 PUFFS INTO THE LUNGS EVERY 6 HOURS AS NEEDED FOR WHEEZING OR SHORTNESS OF BREATH 18 g 6  . Fexofenadine HCl (ALLEGRA PO) Take daily as needed by mouth.    Marland Kitchen ipratropium-albuterol (DUONEB) 0.5-2.5 (3) MG/3ML SOLN Take 3 mLs by nebulization every 4 (four) hours as needed.    Marland Kitchen levothyroxine (SYNTHROID) 150 MCG tablet Take 1 tablet (150 mcg total) by mouth daily before breakfast. 90 tablet 2  . metoprolol succinate (TOPROL-XL) 25 MG 24 hr tablet Take 1 tablet (25 mg total) by mouth daily. Take with or immediately following a meal. 90 tablet 2  . Multiple Vitamins-Minerals (MULTIVITAMIN ADULT PO) Take 1 tablet by mouth daily.     . Nutritional Supplements (BOOST NUTRITIONAL ENERGY PO) Take by mouth. Drinking 1-2 per day     Current Facility-Administered Medications on File Prior to Visit  Medication Dose Route Frequency Provider Last Rate Last Admin  . triamcinolone acetonide (KENALOG-40) injection 60 mg  60 mg Intramuscular Once Marge Duncans, PA-C       Past Medical History:  Diagnosis Date  . COPD (chronic obstructive pulmonary disease) ([place]) 01/21/2020  . History of prostate cancer    seed implant  . Hyperlipemia   . Moderate  persistent asthma, uncomplicated 4/76/5465   Past Surgical History:  Procedure Laterality Date  . APPENDECTOMY      Family History  Problem Relation Age of Onset  . Alzheimer's disease Father   . Diabetes Mother    Social History   Socioeconomic History  . Marital status: Married    Spouse name: Not on file  . Number of children: Not on file  . Years of education: Not on file  . Highest education level: Not on file  Occupational History  . Occupation: Retired    Fish farm manager: OTHER    Comment: Scientist, research (life sciences)  Tobacco Use  . Smoking status: Former Smoker    Types: Cigarettes    Quit date: 1960    Years since quitting: 61.9  . Smokeless tobacco: Former Systems developer    Types: [place] date: 2011  Vaping Use  . Vaping Use: Never used  Substance and Sexual Activity  . Alcohol use: No  . Drug use: No  . Sexual activity: Not Currently  Other Topics Concern  . Not on file  Social History Narrative  . Not on file   Social Determinants of Health   Financial Resource Strain:   . Difficulty of Paying Living Expenses: Not on file  Food Insecurity:   . Worried About Charity fundraiser in the Last Year: Not on file  . Ran Out of  Food in the Last Year: Not on file  Transportation Needs: No Transportation Needs  . Lack of Transportation (Medical): No  . Lack of Transportation (Non-Medical): No  Physical Activity: Sufficiently Active  . Days of Exercise per Week: 6 days  . Minutes of Exercise per Session: 30 min  Stress:   . Feeling of Stress : Not on file  Social Connections:   . Frequency of Communication with Friends and Family: Not on file  . Frequency of Social Gatherings with Friends and Family: Not on file  . Attends Religious Services: Not on file  . Active Member of Clubs or Organizations: Not on file  . Attends Archivist Meetings: Not on file  . Marital Status: Not on file    Review of Systems  Constitutional: Negative.  Negative for activity  change and appetite change.  HENT: Negative for congestion and dental problem.   Eyes: Negative.   Respiratory: Negative for cough, choking and shortness of breath.   Cardiovascular: Negative for chest pain, palpitations and leg swelling.  Genitourinary: Negative.   Skin: Positive for rash (atopic).  Neurological: Negative.      Objective:  BP 122/62 (BP Location: Left Arm, Patient Position: Sitting, Cuff Size: Normal)   Pulse 68   Temp (!) 97 F (36.1 C) (Temporal)   Resp 17   Ht 6' (1.829 m)   Wt 126 lb 3.2 oz (57.2 kg)   SpO2 97%   BMI 17.12 kg/m   BP/Weight 03/19/2020 02/25/2020 0/25/8527  Systolic BP 782 423 536  Diastolic BP 62 60 64  Wt. (Lbs) 126.2 124 125  BMI 17.12 16.82 16.95    Physical Exam Vitals reviewed.  Constitutional:      Appearance: Normal appearance.  HENT:     Head: Normocephalic.     Left Ear: Tympanic membrane normal.  Cardiovascular:     Rate and Rhythm: Normal rate and regular rhythm.     Pulses: Normal pulses.     Heart sounds: Normal heart sounds.  Pulmonary:     Effort: Pulmonary effort is normal.     Breath sounds: Normal breath sounds.  Skin:    Capillary Refill: Capillary refill takes less than 2 seconds.     Comments: Dry skin with maculo-papules and excoriated  Neurological:     General: No focal deficit present.     Mental Status: He is oriented to person, place, and time.       Lab Results  Component Value Date   WBC 8.3 02/04/2020   HGB 11.3 (A) 02/04/2020   HCT 34 (A) 02/04/2020   PLT 262 02/04/2020   GLUCOSE 92 12/12/2019   CHOL 197 12/12/2019   TRIG 71 12/12/2019   HDL 66 12/12/2019   LDLCALC 118 (H) 12/12/2019   ALT 21 02/04/2020   AST 30 02/04/2020   NA 138 02/04/2020   K 4.3 02/04/2020   CL 103 02/04/2020   CREATININE 0.9 02/04/2020   BUN 11 02/04/2020   CO2 26 (A) 02/04/2020   TSH 1.460 01/10/2020      Assessment & Plan:   1. Atopic neurodermatitis - triamcinolone acetonide (KENALOG-40)  injection 80 mg - predniSONE (STERAPRED UNI-PAK 21 TAB) 10 MG (21) TBPK tablet; Take 6ills first day , then 5 pills day 2 and then cut down one pill day until gone  Dispense: 21 tablet; Refill: 3 Patient has atopic dermatitis on his trunk we will give him Kenalog 80 mg and they will use a good emollient  lotion.  He is going to be using [employer].  He will take short showers every other day and drink plenty of fluids.  If he still has problems he will return.       Follow-up: Return in about 4 weeks (around 04/16/2020).  An After Visit Summary was printed and given to the patient.  [facility] [phone]

## 2020-03-19 NOTE — Patient Instructions (Signed)

## 2020-03-26 DIAGNOSIS — Z8546 Personal history of malignant neoplasm of prostate: Secondary | ICD-10-CM | POA: Insufficient documentation

## 2020-03-26 DIAGNOSIS — E785 Hyperlipidemia, unspecified: Secondary | ICD-10-CM | POA: Insufficient documentation

## 2020-03-27 ENCOUNTER — Ambulatory Visit: Payer: Medicare HMO | Admitting: Cardiology

## 2020-03-27 ENCOUNTER — Ambulatory Visit (INDEPENDENT_AMBULATORY_CARE_PROVIDER_SITE_OTHER): Payer: Medicare HMO

## 2020-03-27 ENCOUNTER — Other Ambulatory Visit: Payer: Self-pay

## 2020-03-27 ENCOUNTER — Encounter: Payer: Self-pay | Admitting: Cardiology

## 2020-03-27 VITALS — BP 110/80 | HR 68 | Ht 72.0 in | Wt 123.0 lb

## 2020-03-27 DIAGNOSIS — R001 Bradycardia, unspecified: Secondary | ICD-10-CM

## 2020-03-27 DIAGNOSIS — R42 Dizziness and giddiness: Secondary | ICD-10-CM | POA: Diagnosis not present

## 2020-03-27 DIAGNOSIS — I10 Essential (primary) hypertension: Secondary | ICD-10-CM | POA: Diagnosis not present

## 2020-03-27 DIAGNOSIS — I472 Ventricular tachycardia: Secondary | ICD-10-CM

## 2020-03-27 DIAGNOSIS — I4729 Other ventricular tachycardia: Secondary | ICD-10-CM

## 2020-03-27 NOTE — Patient Instructions (Signed)
Medication Instructions:  Your physician recommends that you continue on your current medications as directed. Please refer to the Current Medication list given to you today.  *If you need a refill on your cardiac medications before your next appointment, please call your pharmacy*   Lab Work: None If you have labs (blood work) drawn today and your tests are completely normal, you will receive your results only by: . MyChart Message (if you have MyChart) OR . A paper copy in the mail If you have any lab test that is abnormal or we need to change your treatment, we will call you to review the results.   Testing/Procedures: A zio monitor was ordered today. It will remain on for 7 days. You will then return monitor and event diary in provided box. It takes 1-2 weeks for report to be downloaded and returned to us. We will call you with the results. If monitor falls off or has orange flashing light, please call Zio for further instructions.      Follow-Up: At [facility], you and your health needs are our priority.  As part of our continuing mission to provide you with exceptional heart care, we have created designated Provider Care Teams.  These Care Teams include your primary Cardiologist (physician) and Advanced Practice Providers (APPs -  Physician Assistants and Nurse Practitioners) who all work together to provide you with the care you need, when you need it.  We recommend signing up for the patient portal called "MyChart".  Sign up information is provided on this After Visit Summary.  MyChart is used to connect with patients for Virtual Visits (Telemedicine).  Patients are able to view lab/test results, encounter notes, upcoming appointments, etc.  Non-urgent messages can be sent to your provider as well.   To learn more about what you can do with MyChart, go to [email or web address].    Your next appointment:   5 month(s)  The format for your next appointment:   In  Person  Provider:   Robert Krasowski, MD   Other Instructions   

## 2020-03-27 NOTE — Addendum Note (Signed)
Addended by: Senaida Ores on: 03/27/2020 11:17 AM   Modules accepted: Orders

## 2020-03-27 NOTE — Progress Notes (Signed)
Cardiology Office Note:    Date:  03/27/2020   ID:  James Iha Sr., DOB 07-11-1931, MRN [id number]  PCP:  [name], MD  Cardiologist:  [name], MD    Referring MD: [name],*   Chief Complaint  Patient presents with  . Follow-up  I fell down few days ago  History of Present Illness:    James Hargadon Sr. is a 86 y.o. male with past medical history significant for bradycardia, nonsustained ventricular tachycardia, last evaluation done in the spring of this year did not show any critical finding on the monitor.  However, [place].  He bruised his face as well as right arm, he comes today to my office with his son there were no witnesses of him falling he tells me that normally he goes to the post office to the ramp however it was very crowded team decided to use stairs and he tripped and fell down.  According to patient he did not passed out.  Obviously concern is that he may have had some arrhythmia which make him fall down I will ask him to wear monitor again to make sure we have no significant bradycardia and no ventricular tachycardia.  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  Past Medical History:  Diagnosis Date  . Acquired hypothyroidism 08/15/2019  . Anemia 02/05/2020  . Atopic dermatitis 12/05/2019  . Bacterial pneumonia, unspecified 06/14/2012   06/14/2012 CXR w/ RML PNA >Zithromax and Rocephin    . Bradycardia 11/28/2018  . COPD (chronic obstructive pulmonary disease) ([place]) 01/21/2020  . Dermatitis 02/05/2020  . Dizziness 02/19/2016  . Essential hypertension 11/28/2018  . History of prostate cancer    seed implant  . Hyperlipemia   . Hyperlipidemia, mixed 08/21/2008   Qualifier: Diagnosis of  By: Annamaria Boots MD, Clinton D   . LVH (left ventricular hypertrophy) 11/28/2018  . Malnutrition of moderate degree  ([place]) 08/15/2019  . Mixed conductive and sensorineural hearing loss, 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  . Moderate persistent asthma, uncomplicated 07/22/1759  . Nonsustained ventricular tachycardia ([place]) 01/30/2019  . Other fatigue 02/05/2020  . Seasonal and perennial allergic rhinitis 05/17/2007      . Senile osteoporosis 08/15/2019  . SINUSITIS, ACUTE 06/28/2007   Qualifier: Diagnosis of  By: Royal Piedra NP, Tammy    . Ventricular ectopy 11/28/2018    Past Surgical History:  Procedure Laterality Date  . APPENDECTOMY      Current Medications: Current Meds  Medication Sig  . albuterol (VENTOLIN HFA) 108 (90 Base) MCG/ACT inhaler INHALE 2 PUFFS INTO THE LUNGS EVERY 6 HOURS AS NEEDED FOR WHEEZING OR SHORTNESS OF BREATH  . Fexofenadine HCl (ALLEGRA PO) Take daily as needed by mouth.  Marland Kitchen ipratropium-albuterol (DUONEB) 0.5-2.5 (3) MG/3ML SOLN Take 3 mLs by nebulization every 4 (four) hours as needed.  Marland Kitchen levothyroxine (SYNTHROID) 150 MCG tablet Take 1 tablet (150 mcg total) by mouth daily before breakfast.  . metoprolol succinate (TOPROL-XL) 25 MG 24 hr tablet Take 1 tablet (25 mg total) by mouth daily. Take with or immediately following a meal.  . Multiple Vitamins-Minerals (MULTIVITAMIN ADULT PO) Take 1 tablet by mouth daily.   . Nutritional Supplements (BOOST NUTRITIONAL ENERGY PO) Take by mouth. Drinking 1-2 per day   Current Facility-Administered Medications for the 03/27/20 encounter (Office Visit) with Park Liter, MD  Medication  .  triamcinolone acetonide (KENALOG-40) injection 60 mg     Allergies:   Clarithromycin   Social History   Socioeconomic History  . Marital status: Married    Spouse name: Not on file  . Number of children: Not on file  . Years of education: Not on file  . Highest education level: Not on file  Occupational History  . Occupation: Retired    Fish farm manager: OTHER    Comment: Scientist, research (life sciences)  Tobacco Use  . Smoking status: Former Smoker    Types: Cigarettes     Quit date: 1960    Years since quitting: 61.9  . Smokeless tobacco: Former Systems developer    Types: [place] date: 2011  Vaping Use  . Vaping Use: Never used  Substance and Sexual Activity  . Alcohol use: No  . Drug use: No  . Sexual activity: Not Currently  Other Topics Concern  . Not on file  Social History Narrative  . Not on file   Social Determinants of Health   Financial Resource Strain:   . Difficulty of Paying Living Expenses: Not on file  Food Insecurity:   . Worried About Charity fundraiser in the Last Year: Not on file  . Ran Out of Food in the Last Year: Not on file  Transportation Needs: No Transportation Needs  . Lack of Transportation (Medical): No  . Lack of Transportation (Non-Medical): No  Physical Activity: Sufficiently Active  . Days of Exercise per Week: 6 days  . Minutes of Exercise per Session: 30 min  Stress:   . Feeling of Stress : Not on file  Social Connections:   . Frequency of Communication with Friends and Family: Not on file  . Frequency of Social Gatherings with Friends and Family: Not on file  . Attends Religious Services: Not on file  . Active Member of Clubs or Organizations: Not on file  . Attends Archivist Meetings: Not on file  . Marital Status: Not on file     Family History: The patient's family history includes Alzheimer's disease in his father; Diabetes in his mother. ROS:   Please see the history of present illness.    All 14 point review of systems negative except as described per history of present illness  EKGs/Labs/Other Studies Reviewed:      Recent Labs: 01/10/2020: TSH 1.460 02/04/2020: ALT 21; BUN 11; Creatinine 0.9; Hemoglobin 11.3; Platelets 262; Potassium 4.3; Sodium 138  Recent Lipid Panel    Component Value Date/Time   CHOL 197 12/12/2019 0848   TRIG 71 12/12/2019 0848   HDL 66 12/12/2019 0848   CHOLHDL 3.0 12/12/2019 0848   LDLCALC 118 (H) 12/12/2019 0848    Physical Exam:    VS:  BP 110/80  (BP Location: Left Arm, Patient Position: Sitting, Cuff Size: Small)   Pulse 68   Ht 6' (1.829 m)   Wt 123 lb (55.8 kg)   SpO2 98%   BMI 16.68 kg/m     Wt Readings from Last 3 Encounters:  03/27/20 123 lb (55.8 kg)  03/19/20 126 lb 3.2 oz (57.2 kg)  02/25/20 124 lb (56.2 kg)     GEN:  Well nourished, well developed in no acute distress HEENT: Normal NECK: No JVD; No carotid bruits LYMPHATICS: No lymphadenopathy CARDIAC: RRR, no murmurs, no rubs, no gallops RESPIRATORY:  Clear to auscultation without rales, wheezing or rhonchi  ABDOMEN: Soft, non-tender, non-distended MUSCULOSKELETAL:  No edema; No deformity  SKIN: Warm and dry  LOWER EXTREMITIES: no swelling NEUROLOGIC:  Alert and oriented x 3 PSYCHIATRIC:  Normal affect   ASSESSMENT:    1. Nonsustained ventricular tachycardia (HCC)   2. Essential hypertension   3. Dizziness   4. Bradycardia    PLAN:    In order of problems listed above:  1. Nonsustained ventricular tachycardia.  Echocardiogram negative stress test negative, overall low risk but he did have a fall which I suspect is simply tripping but I will ask him to wear Zio patch for a week to make sure that he said no significant arrhythmia. 2. Essential hypertension: Blood pressure well controlled continue present management. 3. Dizziness obviously concerning monitor will be placed. 4. Bradycardia will wait for monitor results. 5. Overall I spoke to his son they very concerned about him but at the same time they do want to restrict his freedom.  They are very reasonable and taking very good care of him.   Medication Adjustments/Labs and Tests Ordered: Current medicines are reviewed at length with the patient today.  Concerns regarding medicines are outlined above.  No orders of the defined types were placed in this encounter.  Medication changes: No orders of the defined types were placed in this encounter.   Signed, Park Liter, MD, [facility] 03/27/2020  11:03 AM    [place]

## 2020-03-30 ENCOUNTER — Telehealth: Payer: Self-pay | Admitting: Emergency Medicine

## 2020-03-30 DIAGNOSIS — S52501D Unspecified fracture of the lower end of right radius, subsequent encounter for closed fracture with routine healing: Secondary | ICD-10-CM | POA: Diagnosis not present

## 2020-03-30 NOTE — Telephone Encounter (Signed)
Hi Dr. Drue Dun,  Mr. [name] needs ORIF of right elbow. He recently saw you last week and [place] It was felt to likely be a mechanical fall (tripping on the stairs) but monitor was ordered to rule out concerning arrhyhtmia given history of bradycardia and NSVT. The surgery is listed as urgent. Do you feel like he needs to wait for the monitor results or is it OK for him to go ahead and have his surgery?  Please route response back to P CV DIV PREOP.  Thank you! Khylah Kendra

## 2020-03-30 NOTE — Telephone Encounter (Signed)
   [facility] Pre-operative Risk Assessment    HEARTCARE STAFF: - Please ensure there is not already an duplicate clearance open for this procedure. - Under Visit Info/Reason for Call, type in Other and utilize the format Clearance MM/DD/YY or Clearance TBD. Do not use dashes or single digits. - If request is for dental extraction, please clarify the # of teeth to be extracted.  Request for surgical clearance:  1. What type of surgery is being performed? ORIF RT Elbow   2. When is this surgery scheduled? It is not but labeled as urgent   3. What type of clearance is required (medical clearance vs. Pharmacy clearance to hold med vs. Both)? Medical  4. Are there any medications that need to be held prior to surgery and how long? None specified    5. Practice name and name of physician performing surgery? [facility]    6. What is the office phone number? [phone]   7.   What is the office fax number? [phone]  8.   Anesthesia type (None, local, MAC, general) ? Not specified    [name] 03/30/2020, 4:59 PM  _________________________________________________________________   (provider comments below)

## 2020-03-31 NOTE — Telephone Encounter (Signed)
   Primary Cardiologist: Dr. Agustin Cree,  Chart reviewed as part of pre-operative protocol coverage. Patient was recently seen by Dr. Agustin Cree on 03/27/2020 at which time he reported a [place] post [place] that sounded like a mechanical and resulted in injury to his elbow. A monitor was ordered to rule out concerning arrhythmia given history of bradycardia and NSVT. However, per Dr. Agustin Cree, patient can proceed with surgery and does not need to wait for monitor results.  I will route this recommendation to the requesting party via Epic fax function and remove from pre-op pool.  Please call with questions.  Darreld Mclean, PA-C 03/31/2020, 1:25 PM

## 2020-03-31 NOTE — Telephone Encounter (Signed)
No need to wait for results of the monitor he can proceed with surgery from my point of view

## 2020-04-01 DIAGNOSIS — S52501D Unspecified fracture of the lower end of right radius, subsequent encounter for closed fracture with routine healing: Secondary | ICD-10-CM | POA: Diagnosis not present

## 2020-04-06 ENCOUNTER — Other Ambulatory Visit: Payer: Self-pay

## 2020-04-06 ENCOUNTER — Telehealth: Payer: Self-pay | Admitting: Internal Medicine

## 2020-04-06 ENCOUNTER — Encounter (HOSPITAL_BASED_OUTPATIENT_CLINIC_OR_DEPARTMENT_OTHER): Payer: Self-pay | Admitting: Orthopedic Surgery

## 2020-04-06 NOTE — Telephone Encounter (Signed)
CY please advise.  Form and last OV note placed on your desk.  Thanks

## 2020-04-07 ENCOUNTER — Other Ambulatory Visit (HOSPITAL_COMMUNITY)
Admission: RE | Admit: 2020-04-07 | Discharge: 2020-04-07 | Disposition: A | Discharge status: Home / Self Care | Payer: Medicare HMO | Source: Ambulatory Visit | Attending: Orthopedic Surgery | Admitting: Orthopedic Surgery

## 2020-04-07 DIAGNOSIS — Z01812 Encounter for preprocedural laboratory examination: Secondary | ICD-10-CM | POA: Diagnosis not present

## 2020-04-07 DIAGNOSIS — Z20822 Contact with and (suspected) exposure to covid-19: Secondary | ICD-10-CM | POA: Diagnosis not present

## 2020-04-07 LAB — SARS CORONAVIRUS 2 (TAT 6-24 HRS): SARS Coronavirus 2: NEGATIVE

## 2020-04-09 ENCOUNTER — Other Ambulatory Visit: Payer: Self-pay | Admitting: Legal Medicine

## 2020-04-09 DIAGNOSIS — E039 Hypothyroidism, unspecified: Secondary | ICD-10-CM

## 2020-04-09 NOTE — Telephone Encounter (Signed)
Form was completed by CY on 12/1 and faxed and confirmation received.  Placed in scan folder for CY.  Nothing further is needed.

## 2020-04-09 NOTE — Telephone Encounter (Signed)
Dr. Young please advise.  Thanks! 

## 2020-04-10 ENCOUNTER — Ambulatory Visit (HOSPITAL_BASED_OUTPATIENT_CLINIC_OR_DEPARTMENT_OTHER): Payer: Medicare HMO | Admitting: Certified Registered"

## 2020-04-10 ENCOUNTER — Encounter (HOSPITAL_BASED_OUTPATIENT_CLINIC_OR_DEPARTMENT_OTHER)
Admission: RE | Disposition: A | Discharge status: Home / Self Care | Payer: Self-pay | Source: Home / Self Care | Attending: Orthopedic Surgery

## 2020-04-10 ENCOUNTER — Encounter (HOSPITAL_BASED_OUTPATIENT_CLINIC_OR_DEPARTMENT_OTHER): Payer: Self-pay | Admitting: Orthopedic Surgery

## 2020-04-10 ENCOUNTER — Ambulatory Visit (HOSPITAL_BASED_OUTPATIENT_CLINIC_OR_DEPARTMENT_OTHER)
Admission: RE | Admit: 2020-04-10 | Discharge: 2020-04-10 | Disposition: A | Discharge status: Home / Self Care | Payer: Medicare HMO | Attending: Orthopedic Surgery | Admitting: Orthopedic Surgery

## 2020-04-10 DIAGNOSIS — Z79899 Other long term (current) drug therapy: Secondary | ICD-10-CM | POA: Insufficient documentation

## 2020-04-10 DIAGNOSIS — Z87891 Personal history of nicotine dependence: Secondary | ICD-10-CM | POA: Diagnosis not present

## 2020-04-10 DIAGNOSIS — Z8546 Personal history of malignant neoplasm of prostate: Secondary | ICD-10-CM | POA: Insufficient documentation

## 2020-04-10 DIAGNOSIS — W19XXXA Unspecified fall, initial encounter: Secondary | ICD-10-CM | POA: Diagnosis not present

## 2020-04-10 DIAGNOSIS — Z79890 Hormone replacement therapy (postmenopausal): Secondary | ICD-10-CM | POA: Insufficient documentation

## 2020-04-10 DIAGNOSIS — E782 Mixed hyperlipidemia: Secondary | ICD-10-CM | POA: Diagnosis not present

## 2020-04-10 DIAGNOSIS — S52021A Displaced fracture of olecranon process without intraarticular extension of right ulna, initial encounter for closed fracture: Secondary | ICD-10-CM

## 2020-04-10 DIAGNOSIS — G8918 Other acute postprocedural pain: Secondary | ICD-10-CM | POA: Diagnosis not present

## 2020-04-10 DIAGNOSIS — I10 Essential (primary) hypertension: Secondary | ICD-10-CM | POA: Diagnosis not present

## 2020-04-10 HISTORY — PX: ORIF ELBOW FRACTURE: SHX5031

## 2020-04-10 HISTORY — DX: Displaced fracture of olecranon process without intraarticular extension of right ulna, initial encounter for closed fracture: S52.021A

## 2020-04-10 HISTORY — DX: Unspecified fracture of lower end of right humerus, initial encounter for closed fracture: S42.401A

## 2020-04-10 HISTORY — DX: Malignant (primary) neoplasm, unspecified: C80.1

## 2020-04-10 SURGERY — OPEN REDUCTION INTERNAL FIXATION (ORIF) ELBOW/OLECRANON FRACTURE
Anesthesia: General | Site: Elbow | Laterality: Right

## 2020-04-10 MED ORDER — CEFAZOLIN SODIUM-DEXTROSE 2-4 GM/100ML-% IV SOLN
2.0000 g | INTRAVENOUS | Status: AC
  Administered 2020-04-10: 2 g via INTRAVENOUS

## 2020-04-10 MED ORDER — FENTANYL CITRATE (PF) 100 MCG/2ML IJ SOLN
INTRAMUSCULAR | Status: AC
  Filled 2020-04-10: qty 2

## 2020-04-10 MED ORDER — LIDOCAINE 2% (20 MG/ML) 5 ML SYRINGE
INTRAMUSCULAR | Status: AC
  Filled 2020-04-10: qty 5

## 2020-04-10 MED ORDER — HYDROMORPHONE HCL 1 MG/ML IJ SOLN: 0.2500 mg | INTRAMUSCULAR | Status: DC | PRN

## 2020-04-10 MED ORDER — LIDOCAINE HCL (CARDIAC) PF 100 MG/5ML IV SOSY
PREFILLED_SYRINGE | INTRAVENOUS | Status: DC | PRN
  Administered 2020-04-10: 40 mg via INTRAVENOUS

## 2020-04-10 MED ORDER — FENTANYL CITRATE (PF) 100 MCG/2ML IJ SOLN
50.0000 ug | Freq: Once | INTRAMUSCULAR | Status: AC
  Administered 2020-04-10: 50 ug via INTRAVENOUS

## 2020-04-10 MED ORDER — ONDANSETRON HCL 4 MG/2ML IJ SOLN
INTRAMUSCULAR | Status: DC | PRN
  Administered 2020-04-10: 4 mg via INTRAVENOUS

## 2020-04-10 MED ORDER — BUPIVACAINE HCL (PF) 0.5 % IJ SOLN
INTRAMUSCULAR | Status: DC | PRN
  Administered 2020-04-10: 30 mL via PERINEURAL

## 2020-04-10 MED ORDER — ACETAMINOPHEN 500 MG PO TABS
1000.0000 mg | ORAL_TABLET | Freq: Once | ORAL | Status: AC
  Administered 2020-04-10: 1000 mg via ORAL

## 2020-04-10 MED ORDER — LACTATED RINGERS IV SOLN: INTRAVENOUS | Status: DC

## 2020-04-10 MED ORDER — DEXAMETHASONE SODIUM PHOSPHATE 10 MG/ML IJ SOLN
INTRAMUSCULAR | Status: AC
  Filled 2020-04-10: qty 1

## 2020-04-10 MED ORDER — PROPOFOL 10 MG/ML IV BOLUS
INTRAVENOUS | Status: AC
  Filled 2020-04-10: qty 20

## 2020-04-10 MED ORDER — 0.9 % SODIUM CHLORIDE (POUR BTL) OPTIME
TOPICAL | Status: DC | PRN
  Administered 2020-04-10: 200 mL

## 2020-04-10 MED ORDER — ONDANSETRON HCL 4 MG/2ML IJ SOLN
INTRAMUSCULAR | Status: AC
  Filled 2020-04-10: qty 2

## 2020-04-10 MED ORDER — OXYCODONE HCL 5 MG PO TABS: 5.0000 mg | ORAL_TABLET | Freq: Once | ORAL | Status: DC | PRN

## 2020-04-10 MED ORDER — MIDAZOLAM HCL 2 MG/2ML IJ SOLN
INTRAMUSCULAR | Status: AC
  Filled 2020-04-10: qty 2

## 2020-04-10 MED ORDER — OXYCODONE HCL 5 MG/5ML PO SOLN: 5.0000 mg | Freq: Once | ORAL | Status: DC | PRN

## 2020-04-10 MED ORDER — FENTANYL CITRATE (PF) 100 MCG/2ML IJ SOLN
INTRAMUSCULAR | Status: DC | PRN
  Administered 2020-04-10: 25 ug via INTRAVENOUS

## 2020-04-10 MED ORDER — EPHEDRINE SULFATE 50 MG/ML IJ SOLN
INTRAMUSCULAR | Status: DC | PRN
  Administered 2020-04-10: 5 mg via INTRAVENOUS
  Administered 2020-04-10 (×2): 10 mg via INTRAVENOUS
  Administered 2020-04-10: 5 mg via INTRAVENOUS

## 2020-04-10 MED ORDER — DEXAMETHASONE SODIUM PHOSPHATE 10 MG/ML IJ SOLN
INTRAMUSCULAR | Status: DC | PRN
  Administered 2020-04-10: 5 mg via INTRAVENOUS

## 2020-04-10 MED ORDER — ACETAMINOPHEN 500 MG PO TABS
ORAL_TABLET | ORAL | Status: AC
  Filled 2020-04-10: qty 2

## 2020-04-10 MED ORDER — ACETAMINOPHEN 325 MG PO TABS: 650.0000 mg | ORAL_TABLET | Freq: Four times a day (QID) | ORAL | 1 refills | Status: DC | PRN

## 2020-04-10 MED ORDER — CEFAZOLIN SODIUM-DEXTROSE 2-4 GM/100ML-% IV SOLN
INTRAVENOUS | Status: AC
  Filled 2020-04-10: qty 100

## 2020-04-10 MED ORDER — MIDAZOLAM HCL 2 MG/2ML IJ SOLN
2.0000 mg | Freq: Once | INTRAMUSCULAR | Status: AC
  Administered 2020-04-10: 1 mg via INTRAVENOUS

## 2020-04-10 MED ORDER — AMISULPRIDE (ANTIEMETIC) 5 MG/2ML IV SOLN: 10.0000 mg | Freq: Once | INTRAVENOUS | Status: DC | PRN

## 2020-04-10 MED ORDER — PROMETHAZINE HCL 25 MG/ML IJ SOLN: 6.2500 mg | INTRAMUSCULAR | Status: DC | PRN

## 2020-04-10 MED ORDER — PROPOFOL 10 MG/ML IV BOLUS
INTRAVENOUS | Status: DC | PRN
  Administered 2020-04-10: 100 mg via INTRAVENOUS

## 2020-04-10 SURGICAL SUPPLY — 87 items
BLADE AVERAGE 25MMX9MM (BLADE)
BLADE AVERAGE 25X9 (BLADE) IMPLANT
BLADE MINI RND TIP GREEN BEAV (BLADE) IMPLANT
BLADE SURG 15 STRL LF DISP TIS (BLADE) ×1 IMPLANT
BLADE SURG 15 STRL SS (BLADE) ×3
BNDG CMPR 9X4 STRL LF SNTH (GAUZE/BANDAGES/DRESSINGS) ×1
BNDG COHESIVE 4X5 TAN STRL (GAUZE/BANDAGES/DRESSINGS) ×3 IMPLANT
BNDG ELASTIC 2X5.8 VLCR STR LF (GAUZE/BANDAGES/DRESSINGS) IMPLANT
BNDG ELASTIC 3X5.8 VLCR STR LF (GAUZE/BANDAGES/DRESSINGS) IMPLANT
BNDG ELASTIC 4X5.8 VLCR STR LF (GAUZE/BANDAGES/DRESSINGS) ×6 IMPLANT
BNDG ESMARK 4X9 LF (GAUZE/BANDAGES/DRESSINGS) ×3 IMPLANT
CANISTER SUCT 1200ML W/VALVE (MISCELLANEOUS) ×3 IMPLANT
CLOSURE STERI-STRIP 1/2X4 (GAUZE/BANDAGES/DRESSINGS) ×1
CLSR STERI-STRIP ANTIMIC 1/2X4 (GAUZE/BANDAGES/DRESSINGS) ×2 IMPLANT
COVER BACK TABLE 60X90IN (DRAPES) ×3 IMPLANT
COVER WAND RF STERILE (DRAPES) IMPLANT
CUFF TOURN SGL QUICK 18X3 (MISCELLANEOUS) ×6 IMPLANT
CUFF TOURN SGL QUICK 18X4 (TOURNIQUET CUFF) ×3 IMPLANT
DECANTER SPIKE VIAL GLASS SM (MISCELLANEOUS) ×3 IMPLANT
DRAPE EXTREMITY T 121X128X90 (DISPOSABLE) ×3 IMPLANT
DRAPE IMP U-DRAPE 54X76 (DRAPES) ×3 IMPLANT
DRAPE OEC MINIVIEW 54X84 (DRAPES) ×3 IMPLANT
DRAPE U-SHAPE 47X51 STRL (DRAPES) ×3 IMPLANT
DRSG PAD ABDOMINAL 8X10 ST (GAUZE/BANDAGES/DRESSINGS) ×6 IMPLANT
DURAPREP 26ML APPLICATOR (WOUND CARE) ×3 IMPLANT
ELECT REM PT RETURN 9FT ADLT (ELECTROSURGICAL) ×3
ELECTRODE REM PT RTRN 9FT ADLT (ELECTROSURGICAL) ×1 IMPLANT
EXT HOSE W/PLC CONNECTION (MISCELLANEOUS) ×3
EXTENSION HOSE W/PLC CONNECTON (MISCELLANEOUS) ×1 IMPLANT
GAUZE SPONGE 4X4 12PLY STRL (GAUZE/BANDAGES/DRESSINGS) ×3 IMPLANT
GAUZE XEROFORM 1X8 LF (GAUZE/BANDAGES/DRESSINGS) ×3 IMPLANT
GLOVE BIO SURGEON STRL SZ7 (GLOVE) ×3 IMPLANT
GLOVE BIOGEL PI IND STRL 6.5 (GLOVE) ×2 IMPLANT
GLOVE BIOGEL PI IND STRL 7.0 (GLOVE) ×1 IMPLANT
GLOVE BIOGEL PI IND STRL 8 (GLOVE) ×1 IMPLANT
GLOVE BIOGEL PI INDICATOR 6.5 (GLOVE) ×4
GLOVE BIOGEL PI INDICATOR 7.0 (GLOVE) ×2
GLOVE BIOGEL PI INDICATOR 8 (GLOVE) ×2
GLOVE ORTHO TXT STRL SZ7.5 (GLOVE) ×3 IMPLANT
GLOVE SURG UNDER POLY LF SZ7 (GLOVE) ×3 IMPLANT
GOWN STRL REUS W/ TWL LRG LVL3 (GOWN DISPOSABLE) ×1 IMPLANT
GOWN STRL REUS W/ TWL XL LVL3 (GOWN DISPOSABLE) ×2 IMPLANT
GOWN STRL REUS W/TWL LRG LVL3 (GOWN DISPOSABLE) ×3
GOWN STRL REUS W/TWL XL LVL3 (GOWN DISPOSABLE) ×6
K-WIRE .062X4 (WIRE) ×6 IMPLANT
NDL SUT 6 .5 CRC .975X.05 MAYO (NEEDLE) ×1 IMPLANT
NEEDLE HYPO 25X1 1.5 SAFETY (NEEDLE) ×3 IMPLANT
NEEDLE MAYO TAPER (NEEDLE) ×3
NS IRRIG 1000ML POUR BTL (IV SOLUTION) ×3 IMPLANT
PACK BASIN DAY SURGERY FS (CUSTOM PROCEDURE TRAY) ×3 IMPLANT
PAD CAST 3X4 CTTN HI CHSV (CAST SUPPLIES) IMPLANT
PAD CAST 4YDX4 CTTN HI CHSV (CAST SUPPLIES) ×1 IMPLANT
PADDING CAST ABS 4INX4YD NS (CAST SUPPLIES) ×2
PADDING CAST ABS COTTON 4X4 ST (CAST SUPPLIES) ×1 IMPLANT
PADDING CAST COTTON 3X4 STRL (CAST SUPPLIES)
PADDING CAST COTTON 4X4 STRL (CAST SUPPLIES) ×3
PADDING UNDERCAST 2 STRL (CAST SUPPLIES)
PADDING UNDERCAST 2X4 STRL (CAST SUPPLIES) IMPLANT
PENCIL SMOKE EVACUATOR (MISCELLANEOUS) ×3 IMPLANT
RETRIEVER SUT HEWSON (MISCELLANEOUS) ×3 IMPLANT
SLEEVE SCD COMPRESS KNEE MED (MISCELLANEOUS) ×3 IMPLANT
SLING ARM FOAM STRAP LRG (SOFTGOODS) ×3 IMPLANT
SPLINT FAST PLASTER 5X30 (CAST SUPPLIES) ×20
SPLINT PLASTER CAST FAST 5X30 (CAST SUPPLIES) ×10 IMPLANT
SPLINT PLASTER CAST XFAST 4X15 (CAST SUPPLIES) IMPLANT
SPLINT PLASTER XTRA FAST SET 4 (CAST SUPPLIES)
SPONGE LAP 18X18 RF (DISPOSABLE) ×3 IMPLANT
SPONGE LAP 4X18 RFD (DISPOSABLE) ×6 IMPLANT
STOCKINETTE 4X48 STRL (DRAPES) ×3 IMPLANT
SUCTION FRAZIER HANDLE 10FR (MISCELLANEOUS) ×3
SUCTION TUBE FRAZIER 10FR DISP (MISCELLANEOUS) ×1 IMPLANT
SUT BROADBAND TAPE 2PK 1.5 (SUTURE) ×3 IMPLANT
SUT ETHIBOND 0 MO6 C/R (SUTURE) IMPLANT
SUT ETHILON 3 0 PS 1 (SUTURE) ×6 IMPLANT
SUT ETHILON 4 0 PS 2 18 (SUTURE) IMPLANT
SUT MNCRL AB 4-0 PS2 18 (SUTURE) ×3 IMPLANT
SUT VIC AB 0 SH 27 (SUTURE) IMPLANT
SUT VIC AB 3-0 SH 27 (SUTURE)
SUT VIC AB 3-0 SH 27X BRD (SUTURE) IMPLANT
SUT VICRYL 0 CT-2 (SUTURE) ×6 IMPLANT
SUT VICRYL 3-0 CR8 SH (SUTURE) IMPLANT
SYR BULB EAR ULCER 3OZ GRN STR (SYRINGE) ×3 IMPLANT
SYR CONTROL 10ML LL (SYRINGE) ×3 IMPLANT
TOWEL GREEN STERILE FF (TOWEL DISPOSABLE) ×3 IMPLANT
TUBE CONNECTING 20'X1/4 (TUBING) ×2
TUBE CONNECTING 20X1/4 (TUBING) ×4 IMPLANT
UNDERPAD 30X36 HEAVY ABSORB (UNDERPADS AND DIAPERS) ×3 IMPLANT

## 2020-04-10 NOTE — Anesthesia Procedure Notes (Signed)
Procedure Name: LMA Insertion Date/Time: 04/10/2020 2:12 PM Performed by: Lavonia Dana, CRNA Pre-anesthesia Checklist: Patient identified, Emergency Drugs available, Suction available and Patient being monitored Patient Re-evaluated:Patient Re-evaluated prior to induction Oxygen Delivery Method: Circle system utilized Preoxygenation: Pre-oxygenation with 100% oxygen Induction Type: IV induction Ventilation: Mask ventilation without difficulty LMA: LMA inserted LMA Size: 5.0 Number of attempts: 1 Airway Equipment and Method: Bite block Placement Confirmation: positive ETCO2 Tube secured with: Tape Dental Injury: Teeth and Oropharynx as per pre-operative assessment

## 2020-04-10 NOTE — Transfer of Care (Signed)
Immediate Anesthesia Transfer of Care Note  Patient: James Iha Sr.  Procedure(s) Performed: OPEN REDUCTION INTERNAL FIXATION (ORIF) ELBOW/OLECRANON FRACTURE (Right Elbow)  Patient Location: PACU  Anesthesia Type:General  Level of Consciousness: drowsy, patient cooperative and responds to stimulation  Airway & Oxygen Therapy: Patient Spontanous Breathing and Patient connected to face mask oxygen  Post-op Assessment: Report given to RN and Post -op Vital signs reviewed and stable  Post vital signs: Reviewed and stable  Last Vitals:  Vitals Value Taken Time  BP 144/79 04/10/20 1628  Temp    Pulse 96 04/10/20 1629  Resp 13 04/10/20 1629  SpO2 100 % 04/10/20 1629  Vitals shown include unvalidated device data.  Last Pain:  Vitals:   04/10/20 1153  TempSrc: Oral  PainSc: 0-No pain         Complications: No complications documented.

## 2020-04-10 NOTE — Progress Notes (Signed)
Assisted Dr. Miller with right, ultrasound guided, supraclavicular block. Side rails up, monitors on throughout procedure. See vital signs in flow sheet. Tolerated Procedure well. 

## 2020-04-10 NOTE — Discharge Instructions (Signed)
Post Anesthesia Home Care Instructions  Activity: Get plenty of rest for the remainder of the day. A responsible individual must stay with you for 24 hours following the procedure.  For the next 24 hours, DO NOT: -Drive a car -Paediatric nurse -Drink alcoholic beverages -Take any medication unless instructed by your physician -Make any legal decisions or sign important papers.  Meals: Start with liquid foods such as gelatin or soup. Progress to regular foods as tolerated. Avoid greasy, spicy, heavy foods. If nausea and/or vomiting occur, drink only clear liquids until the nausea and/or vomiting subsides. Call your physician if vomiting continues.  Special Instructions/Symptoms: Your throat may feel dry or sore from the anesthesia or the breathing tube placed in your throat during surgery. If this causes discomfort, gargle with warm salt water. The discomfort should disappear within 24 hours.  If you had a scopolamine patch placed behind your ear for the management of post- operative nausea and/or vomiting:  1. The medication in the patch is effective for 72 hours, after which it should be removed.  Wrap patch in a tissue and discard in the trash. Wash hands thoroughly with soap and water. 2. You may remove the patch earlier than 72 hours if you experience unpleasant side effects which may include dry mouth, dizziness or visual disturbances. 3. Avoid touching the patch. Wash your hands with soap and water after contact with the patch.    May take Tylenol after 6:12pm, if needed.    Diet: As you were doing prior to hospitalization   Shower:  May shower but keep the wounds dry, use an occlusive plastic wrap, NO SOAKING IN TUB.  If the bandage gets wet, change with a clean dry gauze.  If you have a splint on, leave the splint in place and keep the splint dry with a plastic bag.  Dressing:  You may change your dressing 3-5 days after surgery, unless you have a splint.  If you have a  splint, then just leave the splint in place and we will change your bandages during your first follow-up appointment.    If you had hand or foot surgery, we will plan to remove your stitches in about 2 weeks in the office.  For all other surgeries, there are sticky tapes (steri-strips) on your wounds and all the stitches are absorbable.  Leave the steri-strips in place when changing your dressings, they will peel off with time, usually 2-3 weeks.  Activity:  Increase activity slowly as tolerated, but follow the weight bearing instructions below.  The rules on driving is that you can not be taking narcotics while you drive, and you must feel in control of the vehicle.    Weight Bearing:   No weight bearing with right arm  To prevent constipation: you may use a stool softener such as -  Colace (over the counter) 100 mg by mouth twice a day  Drink plenty of fluids (prune juice may be helpful) and high fiber foods Miralax (over the counter) for constipation as needed.    Itching:  If you experience itching with your medications, try taking only a single pain pill, or even half a pain pill at a time.  You may take up to 10 pain pills per day, and you can also use benadryl over the counter for itching or also to help with sleep.   Precautions:  If you experience chest pain or shortness of breath - call 911 immediately for transfer to the hospital emergency department!!  If you develop a fever greater that 101 F, purulent drainage from wound, increased redness or drainage from wound, or calf pain -- Call the office at [phone]                                                Follow- Up Appointment:  Please call for an appointment to be seen in 2 weeks [place] - [phone]   Regional Anesthesia Blocks  1. Numbness or the inability to move the "blocked" extremity may last from 3-48 hours after placement. The length of time depends on the medication injected and your individual response to the  medication. If the numbness is not going away after 48 hours, call your surgeon.  2. The extremity that is blocked will need to be protected until the numbness is gone and the  Strength has returned. Because you cannot feel it, you will need to take extra care to avoid injury. Because it may be weak, you may have difficulty moving it or using it. You may not know what position it is in without looking at it while the block is in effect.  3. For blocks in the legs and feet, returning to weight bearing and walking needs to be done carefully. You will need to wait until the numbness is entirely gone and the strength has returned. You should be able to move your leg and foot normally before you try and bear weight or walk. You will need someone to be with you when you first try to ensure you do not fall and possibly risk injury.  4. Bruising and tenderness at the needle site are common side effects and will resolve in a few days.  5. Persistent numbness or new problems with movement should be communicated to the surgeon or the [place] [phone] [place] [phone]).

## 2020-04-10 NOTE — H&P (Signed)
PREOPERATIVE H&P  Chief Complaint: right elbow pain  HPI: James Whisenant Sr. is a 86 y.o. male who presents for preoperative history and physical with a diagnosis of right olecranon fracture after a fall. Symptoms are rated as moderate to severe, and have been worsening.  This is significantly impairing activities of daily living.  He has elected for surgical management.   Past Medical History:  Diagnosis Date  . Acquired hypothyroidism 08/15/2019  . Anemia 02/05/2020   boarderline anemic  . Atopic dermatitis 12/05/2019  . Bacterial pneumonia, unspecified 06/14/2012   06/14/2012 CXR w/ RML PNA >Zithromax and Rocephin    . Bradycardia 11/28/2018  . Cancer [facility])    prostate cancer-seed implant  . Closed fracture dislocation of right elbow   . COPD (chronic obstructive pulmonary disease) ([place]) 01/21/2020  . Dermatitis 02/05/2020  . Dizziness 02/19/2016  . Essential hypertension 11/28/2018  . History of prostate cancer    seed implant  . Hyperlipemia   . Hyperlipidemia, mixed 08/21/2008   Qualifier: Diagnosis of  By: Annamaria Boots MD, Clinton D   . LVH (left ventricular hypertrophy) 11/28/2018  . Malnutrition of moderate degree ([place]) 08/15/2019  . Mixed conductive and sensorineural hearing loss, 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  . Moderate persistent asthma, uncomplicated 6/37/8588  . Nonsustained ventricular tachycardia ([place]) 01/30/2019  . Other fatigue 02/05/2020  . Seasonal and perennial allergic rhinitis 05/17/2007      . Senile osteoporosis 08/15/2019  . SINUSITIS, ACUTE 06/28/2007   Qualifier: Diagnosis of  By: Royal Piedra NP, Tammy    . Ventricular ectopy 11/28/2018   Past Surgical History:  Procedure Laterality Date  . APPENDECTOMY     Social History   Socioeconomic History  . Marital status: Married    Spouse name: Not on file  . Number of children: Not on file  . Years of education: Not on file  . Highest education level: Not on file  Occupational History  . Occupation: Retired    Fish farm manager: OTHER     Comment: Scientist, research (life sciences)  Tobacco Use  . Smoking status: Former Smoker    Types: Cigarettes    Quit date: 1960    Years since quitting: 61.9  . Smokeless tobacco: Former Systems developer    Types: [place] date: 2011  Vaping Use  . Vaping Use: Never used  Substance and Sexual Activity  . Alcohol use: No  . Drug use: No  . Sexual activity: Not Currently  Other Topics Concern  . Not on file  Social History Narrative  . Not on file   Social Determinants of Health   Financial Resource Strain:   . Difficulty of Paying Living Expenses: Not on file  Food Insecurity:   . Worried About Charity fundraiser in the Last Year: Not on file  . Ran Out of Food in the Last Year: Not on file  Transportation Needs: No Transportation Needs  . Lack of Transportation (Medical): No  . Lack of Transportation (Non-Medical): No  Physical Activity: Sufficiently Active  . Days of Exercise per Week: 6 days  . Minutes of Exercise per Session: 30 min  Stress:   . Feeling of Stress : Not on file  Social Connections:   . Frequency of Communication with Friends and Family: Not on file  . Frequency of Social Gatherings with Friends and Family: Not on file  . Attends Religious Services: Not on file  . Active Member of Clubs or Organizations: Not on file  . Attends Archivist  Meetings: Not on file  . Marital Status: Not on file   Family History  Problem Relation Age of Onset  . Alzheimer's disease Father   . Diabetes Mother    Allergies  Allergen Reactions  . Clarithromycin     REACTION: GI upset   Prior to Admission medications   Medication Sig Start Date End Date Taking? Authorizing Provider  albuterol (VENTOLIN HFA) 108 (90 Base) MCG/ACT inhaler INHALE 2 PUFFS INTO THE LUNGS EVERY 6 HOURS AS NEEDED FOR WHEEZING OR SHORTNESS OF BREATH 01/06/20  Yes Young, Clinton D, MD  Fexofenadine HCl (ALLEGRA PO) Take daily as needed by mouth.   Yes [provider]  ipratropium-albuterol  (DUONEB) 0.5-2.5 (3) MG/3ML SOLN Take 3 mLs by nebulization every 4 (four) hours as needed.   Yes [provider]  metoprolol succinate (TOPROL-XL) 25 MG 24 hr tablet Take 1 tablet (25 mg total) by mouth daily. Take with or immediately following a meal. 01/14/20 10/10/20 Yes Park Liter, MD  Multiple Vitamins-Minerals (MULTIVITAMIN ADULT PO) Take 1 tablet by mouth daily.    Yes [provider]  Nutritional Supplements (BOOST NUTRITIONAL ENERGY PO) Take by mouth. Drinking 1-2 per day   Yes [provider]  SYNTHROID 150 MCG tablet TAKE 1 TABLET BY MOUTH DAILY BEFORE BREAKFAST. 04/09/20  Yes [name], MD     Positive ROS: All other systems have been reviewed and were otherwise negative with the exception of those mentioned in the HPI and as above.  Physical Exam: General: Alert, no acute distress Cardiovascular: No pedal edema Respiratory: No cyanosis, no use of accessory musculature GI: No organomegaly, abdomen is soft and non-tender Skin: No lesions in the area of chief complaint Neurologic: Sensation intact distally Psychiatric: Patient is competent for consent with normal mood and affect Lymphatic: No axillary or cervical lymphadenopathy  MUSCULOSKELETAL: right elbow with ecchymosis and pain and weakness with resisted extension  Assessment: Right olecranon fracture, displaced   Plan: Plan for Procedure(s): OPEN REDUCTION INTERNAL FIXATION (ORIF) ELBOW/OLECRANON FRACTURE  The risks benefits and alternatives were discussed with the patient including but not limited to the risks of nonoperative treatment, versus surgical intervention including infection, bleeding, nerve injury, malunion, nonunion, the need for revision surgery, hardware prominence, hardware failure, the need for hardware removal, blood clots, cardiopulmonary complications, morbidity, mortality, among others, and they were willing to proceed.       Johnny Bridge, MD Cell  [phone]   04/10/2020 1:07 PM

## 2020-04-10 NOTE — Op Note (Signed)
04/10/2020  4:00 PM  PATIENT:  James Iha Sr.    PRE-OPERATIVE DIAGNOSIS:  RIGHT ELBOW DISPLACED FRACTURE OF OLECRANON  POST-OPERATIVE DIAGNOSIS:  Same  PROCEDURE:  OPEN REDUCTION INTERNAL FIXATION (ORIF) ELBOW/OLECRANON FRACTURE  SURGEON:  Johnny Bridge, MD  PHYSICIAN ASSISTANT: Merlene Pulling, PA-C, present and scrubbed throughout the case, critical for completion in a timely fashion, and for retraction, instrumentation, and closure.  ANESTHESIA:   General with regional block  PREOPERATIVE INDICATIONS:  James Iha Sr. is a  86 y.o. male with a diagnosis of [place] who failed conservative measures and elected for surgical management.    The risks benefits and alternatives were discussed with the patient preoperatively including but not limited to the risks of infection, bleeding, nerve injury, cardiopulmonary complications, the need for revision surgery, among others, and the patient was willing to proceed.  ESTIMATED BLOOD LOSS: 50 mL  OPERATIVE IMPLANTS: 0.0625 inch K wires x2 with a high tensile strength tape x2, 1 taken up and down the triceps and through the transverse ulnar drill hole, and then a 2nd 1 taken around the K wires through the drill hole in a tension band fashion  OPERATIVE FINDINGS: Displaced olecranon fracture with a chunk of additional comminuted bone off of the radial side  OPERATIVE PROCEDURE: The patient was brought to the operating room and placed in supine position.  General anesthesia was administered.  IV antibiotics were given.  The right upper extremity was prepped and draped in usual sterile fashion and the patient was in a lateral position on a beanbag with the arm draped over a radiolucent sure foot.  Tourniquet was not utilized, timeout performed, and I made an incision along the posterior olecranon, identified the triceps tendon and the bone fragment, cleared the fracture site of any debris, finding the  keys to the anatomic reduction was a little bit challenging, there was a comminuted piece on the radial side which was not large enough for fixation, and basically fell out of the wound.  This was discarded.  I used a high tensile tape suture up around either side of the proximal fragment and then woven through the triceps up and down, and then I placed a drill hole through the ulna, and passed a temporary suture through that hole.  After reducing the fracture I secured it with a clamp, confirmed the alignment with C arm, and I placed a total of 2 K wires into the appropriate location through the fragment and just engaging the far cortex.  I used a free suture passer to place another high tensile tape around the K wires, and then brought all of the tapes into a figure-of-eight type fashion through the drill hole in the ulna, and then tied the tapes to their respective tails.  Excellent fixation on the fracture was achieved.  I backed the K wires out slightly, bent them, cut them, and then deliver them as deep down past the triceps as possible using a bone tamp.  I suspect that the K wires were just shy of the far cortex, although they would not deliver any further.  The wounds were irrigated copiously, the periosteum and fascia closed with Vicryl, and subcutaneous tissue closed with Vicryl with routine closure for the skin followed by a posterior splint in 30 degrees of extension.  The patient was awakened and returned to the PACU in stable and satisfactory condition.  There were no complications and he tolerated the procedure well.

## 2020-04-10 NOTE — Anesthesia Postprocedure Evaluation (Signed)
Anesthesia Post Note  Patient: James Paulos Sr.  Procedure(s) Performed: OPEN REDUCTION INTERNAL FIXATION (ORIF) ELBOW/OLECRANON FRACTURE (Right Elbow)     Patient location during evaluation: PACU Anesthesia Type: General Level of consciousness: awake Pain management: pain level controlled Vital Signs Assessment: post-procedure vital signs reviewed and stable Respiratory status: spontaneous breathing and respiratory function stable Cardiovascular status: stable Postop Assessment: no apparent nausea or vomiting Anesthetic complications: no   No complications documented.  Last Vitals:  Vitals:   04/10/20 1641 04/10/20 1645  BP:  (!) 146/92  Pulse:  88  Resp:  15  Temp:    SpO2: 100% 100%    Last Pain:  Vitals:   04/10/20 1153  TempSrc: Oral  PainSc: 0-No pain                 Merlinda Frederick

## 2020-04-10 NOTE — Anesthesia Preprocedure Evaluation (Signed)
Anesthesia Evaluation  Patient identified by MRN, date of birth, ID band Patient awake    Reviewed: Allergy & Precautions, NPO status , Patient's Chart, lab work & pertinent test results  Airway Mallampati: II  TM Distance: >3 FB Neck ROM: Full    Dental no notable dental hx.    Pulmonary asthma , COPD, former smoker,    Pulmonary exam normal breath sounds clear to auscultation       Cardiovascular hypertension, Pt. on medications negative cardio ROS Normal cardiovascular exam Rhythm:Regular Rate:Normal     Neuro/Psych negative neurological ROS  negative psych ROS   GI/Hepatic negative GI ROS, Neg liver ROS,   Endo/Other  Hypothyroidism   Renal/GU negative Renal ROS  negative genitourinary   Musculoskeletal negative musculoskeletal ROS (+)   Abdominal   Peds negative pediatric ROS (+)  Hematology negative hematology ROS (+)   Anesthesia Other Findings   Reproductive/Obstetrics negative OB ROS                             Anesthesia Physical Anesthesia Plan  ASA: II  Anesthesia Plan: General   Post-op Pain Management:  Regional for Post-op pain   Induction: Intravenous  PONV Risk Score and Plan: 2 and Ondansetron, Midazolam and Treatment may vary due to age or medical condition  Airway Management Planned: LMA  Additional Equipment:   Intra-op Plan:   Post-operative Plan: Extubation in OR  Informed Consent: I have reviewed the patients History and Physical, chart, labs and discussed the procedure including the risks, benefits and alternatives for the proposed anesthesia with the patient or authorized representative who has indicated his/her understanding and acceptance.     Dental advisory given  Plan Discussed with: CRNA  Anesthesia Plan Comments:         Anesthesia Quick Evaluation

## 2020-04-10 NOTE — Anesthesia Procedure Notes (Signed)
Anesthesia Regional Block: Supraclavicular block   Pre-Anesthetic Checklist: ,, timeout performed, Correct Patient, Correct Site, Correct Laterality, Correct Procedure, Correct Position, site marked, Risks and benefits discussed,  Surgical consent,  Pre-op evaluation,  At surgeon's request and post-op pain management  Laterality: Right  Prep: chloraprep       Needles:  Injection technique: Single-shot  Needle Type: Stimiplex     Needle Length: 9cm  Needle Gauge: 21     Additional Needles:   Procedures:,,,, ultrasound used (permanent image in chart),,,,  Narrative:  Start time: 04/10/2020 1:13 PM End time: 04/10/2020 1:18 PM Injection made incrementally with aspirations every 5 mL.  Performed by: Personally  Anesthesiologist: Lynda Rainwater, MD

## 2020-04-14 ENCOUNTER — Encounter (HOSPITAL_BASED_OUTPATIENT_CLINIC_OR_DEPARTMENT_OTHER): Payer: Self-pay | Admitting: Orthopedic Surgery

## 2020-04-14 DIAGNOSIS — R42 Dizziness and giddiness: Secondary | ICD-10-CM | POA: Diagnosis not present

## 2020-04-21 DIAGNOSIS — L309 Dermatitis, unspecified: Secondary | ICD-10-CM | POA: Diagnosis not present

## 2020-04-21 DIAGNOSIS — R21 Rash and other nonspecific skin eruption: Secondary | ICD-10-CM | POA: Diagnosis not present

## 2020-04-21 DIAGNOSIS — L308 Other specified dermatitis: Secondary | ICD-10-CM | POA: Diagnosis not present

## 2020-04-21 DIAGNOSIS — Z85828 Personal history of other malignant neoplasm of skin: Secondary | ICD-10-CM | POA: Diagnosis not present

## 2020-04-22 DIAGNOSIS — S52021D Displaced fracture of olecranon process without intraarticular extension of right ulna, subsequent encounter for closed fracture with routine healing: Secondary | ICD-10-CM | POA: Diagnosis not present

## 2020-04-23 ENCOUNTER — Ambulatory Visit: Payer: Medicare HMO | Admitting: Legal Medicine

## 2020-05-06 DIAGNOSIS — S52021D Displaced fracture of olecranon process without intraarticular extension of right ulna, subsequent encounter for closed fracture with routine healing: Secondary | ICD-10-CM | POA: Diagnosis not present

## 2020-05-20 DIAGNOSIS — M25522 Pain in left elbow: Secondary | ICD-10-CM | POA: Diagnosis not present

## 2020-05-20 DIAGNOSIS — S52021D Displaced fracture of olecranon process without intraarticular extension of right ulna, subsequent encounter for closed fracture with routine healing: Secondary | ICD-10-CM | POA: Diagnosis not present

## 2020-06-12 ENCOUNTER — Telehealth: Payer: Self-pay

## 2020-06-12 ENCOUNTER — Ambulatory Visit: Payer: Medicare HMO | Admitting: Legal Medicine

## 2020-06-12 NOTE — Progress Notes (Signed)
Chronic Care Management Pharmacy Assistant   Name: James Hehir Sr.  MRN: [id number] DOB: 1931-08-17  Reason for Encounter: General adherence call Patient Questions:  1.  Have you seen any other providers since your last visit?  04/10/20-Elbow surgery 03/27/20-Cardiology  2.  Any changes in your medicines or health? 04/10/20-Elbow surgery    PCP : [name], MD  Allergies:   Allergies  Allergen Reactions  . Clarithromycin     REACTION: GI upset    Medications: Outpatient Encounter Medications as of 06/12/2020  Medication Sig  . acetaminophen (TYLENOL) 325 MG tablet Take 2 tablets (650 mg total) by mouth every 6 (six) hours as needed.  Marland Kitchen albuterol (VENTOLIN HFA) 108 (90 Base) MCG/ACT inhaler INHALE 2 PUFFS INTO THE LUNGS EVERY 6 HOURS AS NEEDED FOR WHEEZING OR SHORTNESS OF BREATH  . Fexofenadine HCl (ALLEGRA PO) Take daily as needed by mouth.  Marland Kitchen ipratropium-albuterol (DUONEB) 0.5-2.5 (3) MG/3ML SOLN Take 3 mLs by nebulization every 4 (four) hours as needed.  . metoprolol succinate (TOPROL-XL) 25 MG 24 hr tablet Take 1 tablet (25 mg total) by mouth daily. Take with or immediately following a meal.  . Multiple Vitamins-Minerals (MULTIVITAMIN ADULT PO) Take 1 tablet by mouth daily.   . Nutritional Supplements (BOOST NUTRITIONAL ENERGY PO) Take by mouth. Drinking 1-2 per day  . SYNTHROID 150 MCG tablet TAKE 1 TABLET BY MOUTH DAILY BEFORE BREAKFAST.   No facility-administered encounter medications on file as of 06/12/2020.    Current Diagnosis: Patient Active Problem List   Diagnosis Date Noted  . Closed fracture of right olecranon process 04/10/2020  . Hyperlipemia   . History of prostate cancer   . Other fatigue 02/05/2020  . Dermatitis 02/05/2020  . Anemia 02/05/2020  . Moderate persistent asthma, uncomplicated 32/35/5732  . COPD (chronic obstructive pulmonary disease) ([place]) 01/21/2020  . Atopic dermatitis 12/05/2019  . Acquired hypothyroidism 08/15/2019   . Mixed conductive and sensorineural hearing loss, 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  . Malnutrition of moderate degree ([place]) 08/15/2019  . Senile osteoporosis 08/15/2019  . Nonsustained ventricular tachycardia ([place]) 01/30/2019  . Bradycardia 11/28/2018  . Ventricular ectopy 11/28/2018  . Essential hypertension 11/28/2018  . LVH (left ventricular hypertrophy) 11/28/2018  . Dizziness 02/19/2016  . Bacterial pneumonia, unspecified 06/14/2012  . Hyperlipidemia, mixed 08/21/2008  . SINUSITIS, ACUTE 06/28/2007  . Seasonal and perennial allergic rhinitis 05/17/2007    Spoke with Daughter, stated patient is doing well from surgery, he had a fall and broke his right elbow.  [name], stated since he has had the cast and surgery they have not checked his blood pressure at home. He takes his Metoprolol as directed.   She stated he is eating what he wants and they have Boost nutrition shakes for extra protein and calories.   Patient stays very active and daughter states sometimes he goes to fast, and that possibly contributed to his fall.    Patient has developed a rash on his torso and back, he is using Eucerin cream and Triamcinolone, he is going to see dermatology on Tuesday to have an Allergy Patch Test to see if they can determine what is causing this issue.  Daughter stated he is taking his medication as directed.  She stated that when he has blood work she always has to call back to get the results, and would like the office to call her with those results and her parents do not answer the phone.     Follow-Up:  Pharmacist Review.  Donette Larry, CPP notified  Clarita Leber, [place] Pharmacist Assistant [phone]

## 2020-06-16 ENCOUNTER — Ambulatory Visit (INDEPENDENT_AMBULATORY_CARE_PROVIDER_SITE_OTHER): Payer: Medicare HMO | Admitting: Legal Medicine

## 2020-06-16 ENCOUNTER — Encounter: Payer: Self-pay | Admitting: Legal Medicine

## 2020-06-16 ENCOUNTER — Other Ambulatory Visit: Payer: Self-pay

## 2020-06-16 VITALS — BP 132/80 | HR 70 | Temp 97.0°F | Resp 18 | Ht 72.0 in | Wt 131.4 lb

## 2020-06-16 DIAGNOSIS — E039 Hypothyroidism, unspecified: Secondary | ICD-10-CM | POA: Diagnosis not present

## 2020-06-16 DIAGNOSIS — E782 Mixed hyperlipidemia: Secondary | ICD-10-CM

## 2020-06-16 DIAGNOSIS — I10 Essential (primary) hypertension: Secondary | ICD-10-CM | POA: Diagnosis not present

## 2020-06-16 DIAGNOSIS — R351 Nocturia: Secondary | ICD-10-CM

## 2020-06-16 DIAGNOSIS — J4540 Moderate persistent asthma, uncomplicated: Secondary | ICD-10-CM

## 2020-06-16 DIAGNOSIS — J449 Chronic obstructive pulmonary disease, unspecified: Secondary | ICD-10-CM | POA: Diagnosis not present

## 2020-06-16 DIAGNOSIS — R5383 Other fatigue: Secondary | ICD-10-CM

## 2020-06-16 DIAGNOSIS — I472 Ventricular tachycardia: Secondary | ICD-10-CM | POA: Diagnosis not present

## 2020-06-16 DIAGNOSIS — N401 Enlarged prostate with lower urinary tract symptoms: Secondary | ICD-10-CM | POA: Diagnosis not present

## 2020-06-16 DIAGNOSIS — E440 Moderate protein-calorie malnutrition: Secondary | ICD-10-CM | POA: Diagnosis not present

## 2020-06-16 DIAGNOSIS — L298 Other pruritus: Secondary | ICD-10-CM | POA: Diagnosis not present

## 2020-06-16 DIAGNOSIS — I4729 Other ventricular tachycardia: Secondary | ICD-10-CM

## 2020-06-16 DIAGNOSIS — Z85828 Personal history of other malignant neoplasm of skin: Secondary | ICD-10-CM | POA: Diagnosis not present

## 2020-06-16 DIAGNOSIS — L239 Allergic contact dermatitis, unspecified cause: Secondary | ICD-10-CM | POA: Diagnosis not present

## 2020-06-16 DIAGNOSIS — M810 Age-related osteoporosis without current pathological fracture: Secondary | ICD-10-CM | POA: Diagnosis not present

## 2020-06-16 NOTE — Progress Notes (Signed)
Subjective:  Patient ID: James Iha Sr., male    DOB: [birth year]  Age: 87 y.o. MRN: [id number]  Chief Complaint  Patient presents with  . Hypertension  . Hypothyroidism    HPI: chronic visit  Patient presents for follow up of hypertension.  Patient tolerating metoprolol well with side effects.  Patient was diagnosed with hypertension 2010 so has been treated for hypertension for 10 years.Patient is working on maintaining diet and exercise regimen and follows up as directed. Complication include none.  Patient has HYPOTHYROIDISM.  Diagnosed 20 years ago.  Patient has stable thyroid readings.  Patient is having weight increased 10 lbs..  Last TSH was normal.  continue dosage of thyroid medicine.   Current Outpatient Medications on File Prior to Visit  Medication Sig Dispense Refill  . acetaminophen (TYLENOL) 325 MG tablet Take 2 tablets (650 mg total) by mouth every 6 (six) hours as needed. 30 tablet 1  . albuterol (VENTOLIN HFA) 108 (90 Base) MCG/ACT inhaler INHALE 2 PUFFS INTO THE LUNGS EVERY 6 HOURS AS NEEDED FOR WHEEZING OR SHORTNESS OF BREATH 18 g 6  . Fexofenadine HCl (ALLEGRA PO) Take daily as needed by mouth.    Marland Kitchen ipratropium-albuterol (DUONEB) 0.5-2.5 (3) MG/3ML SOLN Take 3 mLs by nebulization every 4 (four) hours as needed.    . metoprolol succinate (TOPROL-XL) 25 MG 24 hr tablet Take 1 tablet (25 mg total) by mouth daily. Take with or immediately following a meal. 90 tablet 2  . Multiple Vitamins-Minerals (MULTIVITAMIN ADULT PO) Take 1 tablet by mouth daily.     . Nutritional Supplements (BOOST NUTRITIONAL ENERGY PO) Take by mouth. Drinking 1-2 per day    . SYNTHROID 150 MCG tablet TAKE 1 TABLET BY MOUTH DAILY BEFORE BREAKFAST. 90 tablet 2  . triamcinolone (KENALOG) 0.1 % Apply 1 application topically 2 (two) times daily as needed.    Marland Kitchen alendronate (FOSAMAX) 70 MG tablet Take 70 mg by mouth once a week.     No current facility-administered medications on file prior to  visit.   Past Medical History:  Diagnosis Date  . Acquired hypothyroidism 08/15/2019  . Anemia 02/05/2020   boarderline anemic  . Atopic dermatitis 12/05/2019  . Bacterial pneumonia, unspecified 06/14/2012   06/14/2012 CXR w/ RML PNA >Zithromax and Rocephin    . Bradycardia 11/28/2018  . Cancer [facility])    prostate cancer-seed implant  . Closed fracture dislocation of right elbow   . COPD (chronic obstructive pulmonary disease) ([place]) 01/21/2020  . Dermatitis 02/05/2020  . Dizziness 02/19/2016  . Essential hypertension 11/28/2018  . History of prostate cancer    seed implant  . Hyperlipemia   . Hyperlipidemia, mixed 08/21/2008   Qualifier: Diagnosis of  By: Annamaria Boots MD, Clinton D   . LVH (left ventricular hypertrophy) 11/28/2018  . Malnutrition of moderate degree ([place]) 08/15/2019  . Mixed conductive and sensorineural hearing loss, 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  . Moderate persistent asthma, uncomplicated 5/36/6440  . Nonsustained ventricular tachycardia ([place]) 01/30/2019  . Other fatigue 02/05/2020  . Seasonal and perennial allergic rhinitis 05/17/2007      . Senile osteoporosis 08/15/2019  . SINUSITIS, ACUTE 06/28/2007   Qualifier: Diagnosis of  By: Royal Piedra NP, Tammy    . Ventricular ectopy 11/28/2018   Past Surgical History:  Procedure Laterality Date  . APPENDECTOMY    . ORIF ELBOW FRACTURE Right 04/10/2020   Procedure: OPEN REDUCTION INTERNAL FIXATION (ORIF) ELBOW/OLECRANON FRACTURE;  Surgeon: Marchia Bond, MD;  Location: [place];  Service:  Orthopedics;  Laterality: Right;    Family History  Problem Relation Age of Onset  . Alzheimer's disease Father   . Diabetes Mother    Social History   Socioeconomic History  . Marital status: Married    Spouse name: Not on file  . Number of children: Not on file  . Years of education: Not on file  . Highest education level: Not on file  Occupational History  . Occupation: Retired    Fish farm manager: OTHER    Comment: Scientist, research (life sciences)  Tobacco  Use  . Smoking status: Former Smoker    Types: Cigarettes    Quit date: 1960    Years since quitting: 62.1  . Smokeless tobacco: Former Systems developer    Types: [place] date: 2011  Vaping Use  . Vaping Use: Never used  Substance and Sexual Activity  . Alcohol use: No  . Drug use: No  . Sexual activity: Not Currently  Other Topics Concern  . Not on file  Social History Narrative  . Not on file   Social Determinants of Health   Financial Resource Strain: Not on file  Food Insecurity: Not on file  Transportation Needs: No Transportation Needs  . Lack of Transportation (Medical): No  . Lack of Transportation (Non-Medical): No  Physical Activity: Sufficiently Active  . Days of Exercise per Week: 6 days  . Minutes of Exercise per Session: 30 min  Stress: Not on file  Social Connections: Not on file    Review of Systems  Constitutional: Negative for activity change, diaphoresis and unexpected weight change.  HENT: Negative for congestion and sinus pain.   Respiratory: Negative for apnea and shortness of breath.   Gastrointestinal: Negative.   Endocrine: Negative for polyuria.  Genitourinary: Negative for difficulty urinating, dysuria and urgency.  Musculoskeletal: Positive for arthralgias.  Skin: Negative.   Neurological: Negative.   Psychiatric/Behavioral: Negative.      Objective:  BP 132/80 (BP Location: Left Arm, Patient Position: Sitting, Cuff Size: Normal)   Pulse 70   Temp (!) 97 F (36.1 C) (Temporal)   Resp 18   Ht 6' (1.829 m)   Wt 131 lb 6.4 oz (59.6 kg)   SpO2 96%   BMI 17.82 kg/m   BP/Weight 06/16/2020 04/10/2020 92/42/6834  Systolic BP 196 222 979  Diastolic BP 80 91 80  Wt. (Lbs) 131.4 121.47 123  BMI 17.82 16.47 16.68    Physical Exam Vitals reviewed.  Constitutional:      Appearance: Normal appearance.  HENT:     Head: Normocephalic and atraumatic.     Right Ear: Tympanic membrane, ear canal and external ear normal.     Left Ear: Tympanic  membrane, ear canal and external ear normal.     Mouth/Throat:     Mouth: Mucous membranes are moist.     Pharynx: Oropharynx is clear.  Eyes:     Extraocular Movements: Extraocular movements intact.     Conjunctiva/sclera: Conjunctivae normal.     Pupils: Pupils are equal, round, and reactive to light.  Cardiovascular:     Rate and Rhythm: Normal rate and regular rhythm.     Pulses: Normal pulses.     Heart sounds: Normal heart sounds. No murmur heard. No gallop.   Pulmonary:     Effort: Pulmonary effort is normal. No respiratory distress.     Breath sounds: Normal breath sounds. No rales.  Abdominal:     General: Abdomen is flat. Bowel sounds are normal. There  is no distension.     Palpations: Abdomen is soft.     Tenderness: There is no abdominal tenderness.  Musculoskeletal:        General: Normal range of motion.     Cervical back: Normal range of motion and neck supple.  Skin:    General: Skin is warm and dry.     Capillary Refill: Capillary refill takes less than 2 seconds.     Coloration: Skin is not pale.  Neurological:     General: No focal deficit present.     Mental Status: He is alert and oriented to person, place, and time. Mental status is at baseline.  Psychiatric:        Mood and Affect: Mood normal.        Thought Content: Thought content normal.       Lab Results  Component Value Date   WBC 8.3 02/04/2020   HGB 11.3 (A) 02/04/2020   HCT 34 (A) 02/04/2020   PLT 262 02/04/2020   GLUCOSE 92 12/12/2019   CHOL 197 12/12/2019   TRIG 71 12/12/2019   HDL 66 12/12/2019   LDLCALC 118 (H) 12/12/2019   ALT 21 02/04/2020   AST 30 02/04/2020   NA 138 02/04/2020   K 4.3 02/04/2020   CL 103 02/04/2020   CREATININE 0.9 02/04/2020   BUN 11 02/04/2020   CO2 26 (A) 02/04/2020   TSH 1.460 01/10/2020      Assessment & Plan:  Diagnoses and all orders for this visit: Essential hypertension -     CBC with Differential/Platelet -     Comprehensive metabolic  panel An individual hypertension care plan was established and reinforced today.  The patient's status was assessed using clinical findings on exam and labs or diagnostic tests. The patient's success at meeting treatment goals on disease specific evidence-based guidelines and found to be well controlled. SELF MANAGEMENT: The patient and I together assessed ways to personally work towards obtaining the recommended goals. RECOMMENDATIONS: avoid decongestants found in common cold remedies, decrease consumption of alcohol, perform routine monitoring of BP with home BP cuff, exercise, reduction of dietary salt, take medicines as prescribed, try not to miss doses and quit smoking.  Regular exercise and maintaining a healthy weight is needed.  Stress reduction may help. A CLINICAL SUMMARY including written plan identify barriers to care unique to individual due to social or financial issues.  We attempt to mutually creat solutions for individual and family understanding.  Chronic obstructive pulmonary disease, unspecified COPD type ([place]) An individualize plan was formulated for care of COPD.  Treatment is evidence based.  She will continue on inhalers, avoid smoking and smoke.  Regular exercise with help with dyspnea. Routine follow ups and medication compliance is needed.  Acquired hypothyroidism -     TSH Patient is known to have hypothyroidism and is n treatment with levothyroxine 141mcg.  Patient was diagnosed 20 years ago.  Other treatment includes none.  Patient is compliant with medicines and last TSH 6 months ago.  Last TSH was nomral . Hyperlipidemia, mixed -     Lipid panel AN INDIVIDUAL CARE PLAN for hyperlipidemia/ cholesterol was established and reinforced today.  The patient's status was assessed using clinical findings on exam, lab and other diagnostic tests. The patient's disease status was assessed based on evidence-based guidelines and found to be well controlled. MEDICATIONS were  reviewed. SELF MANAGEMENT GOALS have been discussed and patient's success at attaining the goal of low cholesterol was assessed.  RECOMMENDATION given include regular exercise 3 days a week and low cholesterol/low fat diet. CLINICAL SUMMARY including written plan to identify barriers unique to the patient due to social or economic  reasons was discussed.  Malnutrition of moderate degree (HCC) Supplement nutrition with protein/calorie supplement with meals to improve nutritional status. He has gained 10 lbs  Other fatigue His fatigue has improved after he got his cast off for fractures ebow  Moderate persistent asthma, uncomplicated This patient has asthma mild and is on albuterol HFA.  Patient is not having a flair.  Chronic medicines include albuterol. Addition new medicines none.  Asthma action plan is in place.   Senile osteoporosis AN INDIVIDUAL CARE PLAN for osteoorosis was established and reinforced today.  The patient's status was assessed using clinical findings on exam, labs, and other diagnostic testing. Patient's success at meeting treatment goals based on disease specific evidence-bassed guidelines and found to be in fair control. RECOMMENDATIONS include maintaining present medicines and treatment.  Benign prostatic hyperplasia with nocturia AN INDIVIDUAL CARE PLAN BPH was established and reinforced today.  The patient's status was assessed using clinical findings on exam, labs, and other diagnostic testing. Patient's success at meeting treatment goals based on disease specific evidence-bassed guidelines and found to be in good control. RECOMMENDATIONS include maintaining present medicines and treatment.  Nonsustained ventricular tachycardia ([place]) Patient has not had any more ventricular tachycardia per event monitr        I spent 35 minutes dedicated to the care of this patient on the date of this encounter to include face-to-face time with the patient, as well [id number]  cardiology records and orthopedic records  Follow-up: Return in about 6 months (around 12/14/2020) for fasting.  An After Visit Summary was printed and given to the patient.  Reinaldo Meeker, MD [facility] [phone]

## 2020-06-17 DIAGNOSIS — S52021D Displaced fracture of olecranon process without intraarticular extension of right ulna, subsequent encounter for closed fracture with routine healing: Secondary | ICD-10-CM | POA: Diagnosis not present

## 2020-06-17 LAB — LIPID PANEL
Chol/HDL Ratio: 4.2 ratio (ref 0.0–5.0)
Cholesterol, Total: 206 mg/dL — ABNORMAL HIGH (ref 100–199)
HDL: 49 mg/dL (ref >39)
LDL Chol Calc (NIH): 137 mg/dL — ABNORMAL HIGH (ref 0–99)
Triglycerides: 113 mg/dL (ref 0–149)
VLDL Cholesterol Cal: 20 mg/dL (ref 5–40)

## 2020-06-17 LAB — COMPREHENSIVE METABOLIC PANEL
ALT: 13 IU/L (ref 0–44)
AST: 18 IU/L (ref 0–40)
Albumin/Globulin Ratio: 1.8 (ref 1.2–2.2)
Albumin: 4.1 g/dL (ref 3.6–4.6)
Alkaline Phosphatase: 69 IU/L (ref 44–121)
BUN/Creatinine Ratio: 17 (ref 10–24)
BUN: 15 mg/dL (ref 8–27)
Bilirubin Total: 0.3 mg/dL (ref 0.0–1.2)
CO2: 21 mmol/L (ref 20–29)
Calcium: 9.4 mg/dL (ref 8.6–10.2)
Chloride: 102 mmol/L (ref 96–106)
Creatinine, Ser: 0.9 mg/dL (ref 0.76–1.27)
GFR calc Af Amer: 89 mL/min/{1.73_m2} (ref >59)
GFR calc non Af Amer: 77 mL/min/{1.73_m2} (ref >59)
Globulin, Total: 2.3 g/dL (ref 1.5–4.5)
Glucose: 93 mg/dL (ref 65–99)
Potassium: 4.7 mmol/L (ref 3.5–5.2)
Sodium: 138 mmol/L (ref 134–144)
Total Protein: 6.4 g/dL (ref 6.0–8.5)

## 2020-06-17 LAB — TSH: TSH: 1.36 u[IU]/mL (ref 0.450–4.500)

## 2020-06-17 LAB — CBC WITH DIFFERENTIAL/PLATELET
Basophils Absolute: 0.1 10*3/uL (ref 0.0–0.2)
Basos: 1 %
EOS (ABSOLUTE): 0.6 10*3/uL — ABNORMAL HIGH (ref 0.0–0.4)
Eos: 7 %
Hematocrit: 32.7 % — ABNORMAL LOW (ref 37.5–51.0)
Hemoglobin: 10.3 g/dL — ABNORMAL LOW (ref 13.0–17.7)
Immature Grans (Abs): 0 10*3/uL (ref 0.0–0.1)
Immature Granulocytes: 0 %
Lymphocytes Absolute: 2 10*3/uL (ref 0.7–3.1)
Lymphs: 23 %
MCH: 27.2 pg (ref 26.6–33.0)
MCHC: 31.5 g/dL (ref 31.5–35.7)
MCV: 86 fL (ref 79–97)
Monocytes Absolute: 1.2 10*3/uL — ABNORMAL HIGH (ref 0.1–0.9)
Monocytes: 14 %
Neutrophils Absolute: 4.6 10*3/uL (ref 1.4–7.0)
Neutrophils: 55 %
Platelets: 301 10*3/uL (ref 150–450)
RBC: 3.79 x10E6/uL — ABNORMAL LOW (ref 4.14–5.80)
RDW: 12.8 % (ref 11.6–15.4)
WBC: 8.5 10*3/uL (ref 3.4–10.8)

## 2020-06-17 LAB — CARDIOVASCULAR RISK ASSESSMENT

## 2020-06-17 NOTE — Progress Notes (Signed)
Still has anemia but lower, check hemocults x 3, kidney and liver tests normal, LDL cholesterol 81 but > 85 years old use DASH diet, TSH 1.3 normal lp

## 2020-06-21 ENCOUNTER — Emergency Department (HOSPITAL_COMMUNITY): Payer: Medicare HMO

## 2020-06-21 ENCOUNTER — Encounter (HOSPITAL_COMMUNITY): Payer: Self-pay | Admitting: Emergency Medicine

## 2020-06-21 ENCOUNTER — Other Ambulatory Visit: Payer: Self-pay

## 2020-06-21 ENCOUNTER — Inpatient Hospital Stay (HOSPITAL_COMMUNITY)
Admission: EM | Admit: 2020-06-21 | Discharge: 2020-06-25 | DRG: 246 | Disposition: A | Discharge status: Home Health | Payer: Medicare HMO | Attending: Cardiology | Admitting: Cardiology

## 2020-06-21 DIAGNOSIS — I2510 Atherosclerotic heart disease of native coronary artery without angina pectoris: Secondary | ICD-10-CM | POA: Diagnosis not present

## 2020-06-21 DIAGNOSIS — E876 Hypokalemia: Secondary | ICD-10-CM | POA: Diagnosis present

## 2020-06-21 DIAGNOSIS — I10 Essential (primary) hypertension: Secondary | ICD-10-CM | POA: Diagnosis present

## 2020-06-21 DIAGNOSIS — I5021 Acute systolic (congestive) heart failure: Secondary | ICD-10-CM | POA: Diagnosis present

## 2020-06-21 DIAGNOSIS — M810 Age-related osteoporosis without current pathological fracture: Secondary | ICD-10-CM | POA: Diagnosis present

## 2020-06-21 DIAGNOSIS — Z20822 Contact with and (suspected) exposure to covid-19: Secondary | ICD-10-CM | POA: Diagnosis not present

## 2020-06-21 DIAGNOSIS — Z79899 Other long term (current) drug therapy: Secondary | ICD-10-CM

## 2020-06-21 DIAGNOSIS — Z743 Need for continuous supervision: Secondary | ICD-10-CM | POA: Diagnosis not present

## 2020-06-21 DIAGNOSIS — Z87891 Personal history of nicotine dependence: Secondary | ICD-10-CM | POA: Diagnosis not present

## 2020-06-21 DIAGNOSIS — Z79890 Hormone replacement therapy (postmenopausal): Secondary | ICD-10-CM | POA: Diagnosis not present

## 2020-06-21 DIAGNOSIS — J449 Chronic obstructive pulmonary disease, unspecified: Secondary | ICD-10-CM | POA: Diagnosis present

## 2020-06-21 DIAGNOSIS — D649 Anemia, unspecified: Secondary | ICD-10-CM | POA: Diagnosis present

## 2020-06-21 DIAGNOSIS — D72829 Elevated white blood cell count, unspecified: Secondary | ICD-10-CM | POA: Diagnosis present

## 2020-06-21 DIAGNOSIS — D519 Vitamin B12 deficiency anemia, unspecified: Secondary | ICD-10-CM | POA: Diagnosis not present

## 2020-06-21 DIAGNOSIS — F05 Delirium due to known physiological condition: Secondary | ICD-10-CM | POA: Diagnosis not present

## 2020-06-21 DIAGNOSIS — R0789 Other chest pain: Secondary | ICD-10-CM | POA: Diagnosis not present

## 2020-06-21 DIAGNOSIS — I428 Other cardiomyopathies: Secondary | ICD-10-CM | POA: Diagnosis present

## 2020-06-21 DIAGNOSIS — I110 Hypertensive heart disease with heart failure: Secondary | ICD-10-CM | POA: Diagnosis present

## 2020-06-21 DIAGNOSIS — I214 Non-ST elevation (NSTEMI) myocardial infarction: Principal | ICD-10-CM

## 2020-06-21 DIAGNOSIS — Z881 Allergy status to other antibiotic agents status: Secondary | ICD-10-CM

## 2020-06-21 DIAGNOSIS — D509 Iron deficiency anemia, unspecified: Secondary | ICD-10-CM | POA: Diagnosis present

## 2020-06-21 DIAGNOSIS — I493 Ventricular premature depolarization: Secondary | ICD-10-CM | POA: Diagnosis present

## 2020-06-21 DIAGNOSIS — H906 Mixed conductive and sensorineural hearing loss, bilateral: Secondary | ICD-10-CM | POA: Diagnosis present

## 2020-06-21 DIAGNOSIS — Z8546 Personal history of malignant neoplasm of prostate: Secondary | ICD-10-CM

## 2020-06-21 DIAGNOSIS — R778 Other specified abnormalities of plasma proteins: Secondary | ICD-10-CM

## 2020-06-21 DIAGNOSIS — E1165 Type 2 diabetes mellitus with hyperglycemia: Secondary | ICD-10-CM | POA: Diagnosis not present

## 2020-06-21 DIAGNOSIS — Z7983 Long term (current) use of bisphosphonates: Secondary | ICD-10-CM | POA: Diagnosis not present

## 2020-06-21 DIAGNOSIS — E782 Mixed hyperlipidemia: Secondary | ICD-10-CM | POA: Diagnosis not present

## 2020-06-21 DIAGNOSIS — R7989 Other specified abnormal findings of blood chemistry: Secondary | ICD-10-CM | POA: Diagnosis not present

## 2020-06-21 DIAGNOSIS — R079 Chest pain, unspecified: Secondary | ICD-10-CM | POA: Diagnosis not present

## 2020-06-21 DIAGNOSIS — E039 Hypothyroidism, unspecified: Secondary | ICD-10-CM | POA: Diagnosis present

## 2020-06-21 DIAGNOSIS — I4891 Unspecified atrial fibrillation: Secondary | ICD-10-CM

## 2020-06-21 DIAGNOSIS — R69 Illness, unspecified: Secondary | ICD-10-CM | POA: Diagnosis not present

## 2020-06-21 DIAGNOSIS — R296 Repeated falls: Secondary | ICD-10-CM | POA: Diagnosis present

## 2020-06-21 DIAGNOSIS — I255 Ischemic cardiomyopathy: Secondary | ICD-10-CM | POA: Diagnosis not present

## 2020-06-21 DIAGNOSIS — I480 Paroxysmal atrial fibrillation: Secondary | ICD-10-CM | POA: Diagnosis present

## 2020-06-21 HISTORY — DX: Non-ST elevation (NSTEMI) myocardial infarction: I21.4

## 2020-06-21 LAB — CBC
HCT: 32.1 % — ABNORMAL LOW (ref 39.0–52.0)
Hemoglobin: 9.8 g/dL — ABNORMAL LOW (ref 13.0–17.0)
MCH: 27.1 pg (ref 26.0–34.0)
MCHC: 30.5 g/dL (ref 30.0–36.0)
MCV: 88.9 fL (ref 80.0–100.0)
Platelets: 300 10*3/uL (ref 150–400)
RBC: 3.61 MIL/uL — ABNORMAL LOW (ref 4.22–5.81)
RDW: 13 % (ref 11.5–15.5)
WBC: 10.1 10*3/uL (ref 4.0–10.5)
nRBC: 0 % (ref 0.0–0.2)

## 2020-06-21 LAB — BASIC METABOLIC PANEL
Anion gap: 13 (ref 5–15)
BUN: 22 mg/dL (ref 8–23)
CO2: 20 mmol/L — ABNORMAL LOW (ref 22–32)
Calcium: 8.6 mg/dL — ABNORMAL LOW (ref 8.9–10.3)
Chloride: 102 mmol/L (ref 98–111)
Creatinine, Ser: 0.88 mg/dL (ref 0.61–1.24)
GFR, Estimated: >60 mL/min (ref >60)
Glucose, Bld: 204 mg/dL — ABNORMAL HIGH (ref 70–99)
Potassium: 3 mmol/L — ABNORMAL LOW (ref 3.5–5.1)
Sodium: 135 mmol/L (ref 135–145)

## 2020-06-21 LAB — RESP PANEL BY RT-PCR (FLU A&B, COVID) ARPGX2
Influenza A by PCR: NEGATIVE
Influenza B by PCR: NEGATIVE
SARS Coronavirus 2 by RT PCR: NEGATIVE

## 2020-06-21 LAB — TROPONIN I (HIGH SENSITIVITY)
Troponin I (High Sensitivity): 111 ng/L (ref <18)
Troponin I (High Sensitivity): 276 ng/L (ref <18)

## 2020-06-21 MED ORDER — ASPIRIN 81 MG PO CHEW
324.0000 mg | CHEWABLE_TABLET | Freq: Once | ORAL | Status: AC
  Administered 2020-06-21: 324 mg via ORAL
  Filled 2020-06-21: qty 4

## 2020-06-21 MED ORDER — ALBUTEROL SULFATE HFA 108 (90 BASE) MCG/ACT IN AERS
2.0000 | INHALATION_SPRAY | RESPIRATORY_TRACT | Status: DC | PRN
  Filled 2020-06-21: qty 6.7

## 2020-06-21 MED ORDER — DILTIAZEM HCL 25 MG/5ML IV SOLN
10.0000 mg | Freq: Once | INTRAVENOUS | Status: AC
  Administered 2020-06-21: 10 mg via INTRAVENOUS
  Filled 2020-06-21: qty 5

## 2020-06-21 MED ORDER — METOPROLOL SUCCINATE ER 25 MG PO TB24
25.0000 mg | ORAL_TABLET | Freq: Every day | ORAL | Status: DC
  Administered 2020-06-22: 25 mg via ORAL
  Filled 2020-06-21 (×2): qty 1

## 2020-06-21 MED ORDER — DILTIAZEM HCL 30 MG PO TABS
30.0000 mg | ORAL_TABLET | Freq: Four times a day (QID) | ORAL | Status: DC
  Administered 2020-06-21: 30 mg via ORAL
  Filled 2020-06-21: qty 1

## 2020-06-21 MED ORDER — ASPIRIN 81 MG PO CHEW
324.0000 mg | CHEWABLE_TABLET | Freq: Once | ORAL | Status: DC
  Filled 2020-06-21: qty 4

## 2020-06-21 MED ORDER — HEPARIN (PORCINE) 25000 UT/250ML-% IV SOLN
850.0000 [IU]/h | INTRAVENOUS | Status: DC
  Administered 2020-06-21: 850 [IU]/h via INTRAVENOUS
  Filled 2020-06-21: qty 250

## 2020-06-21 MED ORDER — HEPARIN BOLUS VIA INFUSION
3000.0000 [IU] | Freq: Once | INTRAVENOUS | Status: AC
  Administered 2020-06-21: 3000 [IU] via INTRAVENOUS
  Filled 2020-06-21: qty 3000

## 2020-06-21 MED ORDER — IPRATROPIUM-ALBUTEROL 0.5-2.5 (3) MG/3ML IN SOLN: 3.0000 mL | RESPIRATORY_TRACT | Status: DC | PRN

## 2020-06-21 MED ORDER — LEVOTHYROXINE SODIUM 75 MCG PO TABS
150.0000 ug | ORAL_TABLET | Freq: Every day | ORAL | Status: DC
  Administered 2020-06-22 – 2020-06-25 (×4): 150 ug via ORAL
  Filled 2020-06-21 (×4): qty 2

## 2020-06-21 NOTE — ED Triage Notes (Signed)
Pt to triage via [facility] from home.  Reports chest pain that started after supper.  Pt with Afib RVR 110-170.  EMS started 20g IV RAC and pt received NS 300cc.  States HR down to 110-130.  CBG 290.  Denies history of diabetes.  Pt taking prednisone this week.  Denies pain on arrival.

## 2020-06-21 NOTE — ED Provider Notes (Signed)
[place] [facility] EMERGENCY DEPARTMENT Provider Note   CSN: [id number] Arrival date & time: 06/21/20  1844     History Chief Complaint  Patient presents with  . Chest Pain    AFIB RVR    James Werth Sr. is a 87 y.o. male.  HPI   Patient presents to the emergency room for evaluate of chest pain in shortness of breath.  Patient states the symptoms have been going on for most of the day.  He was also noticing that his heart was racing.  Patient states currently he is not having any pain.  He denies having any fevers or chills.  No vomiting or diarrhea  Past Medical History:  Diagnosis Date  . Acquired hypothyroidism 08/15/2019  . Anemia 02/05/2020   boarderline anemic  . Atopic dermatitis 12/05/2019  . Bacterial pneumonia, unspecified 06/14/2012   06/14/2012 CXR w/ RML PNA >Zithromax and Rocephin    . Bradycardia 11/28/2018  . Cancer [facility])    prostate cancer-seed implant  . Closed fracture dislocation of right elbow   . COPD (chronic obstructive pulmonary disease) ([place]) 01/21/2020  . Dermatitis 02/05/2020  . Dizziness 02/19/2016  . Essential hypertension 11/28/2018  . History of prostate cancer    seed implant  . Hyperlipemia   . Hyperlipidemia, mixed 08/21/2008   Qualifier: Diagnosis of  By: Annamaria Boots MD, Clinton D   . LVH (left ventricular hypertrophy) 11/28/2018  . Malnutrition of moderate degree ([place]) 08/15/2019  . Mixed conductive and sensorineural hearing loss, 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  . Moderate persistent asthma, uncomplicated 5/73/2202  . Nonsustained ventricular tachycardia ([place]) 01/30/2019  . Other fatigue 02/05/2020  . Seasonal and perennial allergic rhinitis 05/17/2007      . Senile osteoporosis 08/15/2019  . SINUSITIS, ACUTE 06/28/2007   Qualifier: Diagnosis of  By: Royal Piedra NP, Tammy    . Ventricular ectopy 11/28/2018    Patient Active Problem List   Diagnosis Date Noted  . Closed fracture of right olecranon process 04/10/2020  . Hyperlipemia   . History of  prostate cancer   . Other fatigue 02/05/2020  . Dermatitis 02/05/2020  . Anemia 02/05/2020  . Moderate persistent asthma, uncomplicated 54/27/0623  . COPD (chronic obstructive pulmonary disease) ([place]) 01/21/2020  . Atopic dermatitis 12/05/2019  . Acquired hypothyroidism 08/15/2019  . Mixed conductive and sensorineural hearing loss, 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  . Malnutrition of moderate degree ([place]) 08/15/2019  . Senile osteoporosis 08/15/2019  . Nonsustained ventricular tachycardia ([place]) 01/30/2019  . Bradycardia 11/28/2018  . Ventricular ectopy 11/28/2018  . Essential hypertension 11/28/2018  . LVH (left ventricular hypertrophy) 11/28/2018  . Dizziness 02/19/2016  . Bacterial pneumonia, unspecified 06/14/2012  . Hyperlipidemia, mixed 08/21/2008  . SINUSITIS, ACUTE 06/28/2007  . Seasonal and perennial allergic rhinitis 05/17/2007    Past Surgical History:  Procedure Laterality Date  . APPENDECTOMY    . ORIF ELBOW FRACTURE Right 04/10/2020   Procedure: OPEN REDUCTION INTERNAL FIXATION (ORIF) ELBOW/OLECRANON FRACTURE;  Surgeon: Marchia Bond, MD;  Location: [place];  Service: Orthopedics;  Laterality: Right;       Family History  Problem Relation Age of Onset  . Alzheimer's disease Father   . Diabetes Mother     Social History   Tobacco Use  . Smoking status: Former Smoker    Types: Cigarettes    Quit date: 1960    Years since quitting: 62.1  . Smokeless tobacco: Former Systems developer    Types: [place] date: 2011  Vaping Use  .  Vaping Use: Never used  Substance Use Topics  . Alcohol use: No  . Drug use: No    Home Medications Prior to Admission medications   Medication Sig Start Date End Date Taking? Authorizing Provider  acetaminophen (TYLENOL) 325 MG tablet Take 2 tablets (650 mg total) by mouth every 6 (six) hours as needed. 04/10/20   Merlene Pulling K, PA-C  albuterol (VENTOLIN HFA) 108 (90 Base) MCG/ACT inhaler INHALE 2 PUFFS INTO THE LUNGS EVERY 6  HOURS AS NEEDED FOR WHEEZING OR SHORTNESS OF BREATH 01/06/20   Baird Lyons D, MD  alendronate (FOSAMAX) 70 MG tablet Take 70 mg by mouth once a week. 04/06/20   [provider]  Fexofenadine HCl (ALLEGRA PO) Take daily as needed by mouth.    [provider]  ipratropium-albuterol (DUONEB) 0.5-2.5 (3) MG/3ML SOLN Take 3 mLs by nebulization every 4 (four) hours as needed.    [provider]  metoprolol succinate (TOPROL-XL) 25 MG 24 hr tablet Take 1 tablet (25 mg total) by mouth daily. Take with or immediately following a meal. 01/14/20 10/10/20  Park Liter, MD  Multiple Vitamins-Minerals (MULTIVITAMIN ADULT PO) Take 1 tablet by mouth daily.     [provider]  Nutritional Supplements (BOOST NUTRITIONAL ENERGY PO) Take by mouth. Drinking 1-2 per day    [provider]  SYNTHROID 150 MCG tablet TAKE 1 TABLET BY MOUTH DAILY BEFORE BREAKFAST. 04/09/20   [name], MD  triamcinolone (KENALOG) 0.1 % Apply 1 application topically 2 (two) times daily as needed. 06/08/20   [provider]    Allergies    Clarithromycin  Review of Systems   Review of Systems  All other systems reviewed and are negative.   Physical Exam Updated Vital Signs BP 121/76   Pulse (!) 148   Temp 98.3 F (36.8 C) (Oral)   Resp 19   Ht 1.829 m (6')   Wt 61.2 kg   SpO2 99%   BMI 18.31 kg/m   Physical Exam Vitals and nursing note reviewed.  Constitutional:      General: He is not in acute distress.    Appearance: He is well-developed and well-nourished. He is not diaphoretic.     Comments: Elderly , frail  HENT:     Head: Normocephalic and atraumatic.     Right Ear: External ear normal.     Left Ear: External ear normal.  Eyes:     General: No scleral icterus.       Right eye: No discharge.        Left eye: No discharge.     Conjunctiva/sclera: Conjunctivae normal.  Neck:     Trachea: No tracheal deviation.  Cardiovascular:     Rate  and Rhythm: Tachycardia present. Rhythm irregular.     Pulses: Intact distal pulses.  Pulmonary:     Effort: Pulmonary effort is normal. No respiratory distress.     Breath sounds: Normal breath sounds. No stridor. No wheezing or rales.  Abdominal:     General: Bowel sounds are normal. There is no distension.     Palpations: Abdomen is soft.     Tenderness: There is no abdominal tenderness. There is no guarding or rebound.  Musculoskeletal:        General: No tenderness or edema.     Cervical back: Neck supple.  Skin:    General: Skin is warm and dry.     Findings: No rash.  Neurological:     Mental  Status: He is alert.     Cranial Nerves: No cranial nerve deficit (no facial droop, extraocular movements intact, no slurred speech).     Sensory: No sensory deficit.     Motor: No abnormal muscle tone or seizure activity.     Coordination: Coordination normal.     Deep Tendon Reflexes: Strength normal.  Psychiatric:        Mood and Affect: Mood and affect normal.     ED Results / Procedures / Treatments   Labs (all labs ordered are listed, but only abnormal results are displayed) Labs Reviewed  BASIC METABOLIC PANEL - Abnormal; Notable for the following components:      Result Value   Potassium 3.0 (*)    CO2 20 (*)    Glucose, Bld 204 (*)    Calcium 8.6 (*)    All other components within normal limits  CBC - Abnormal; Notable for the following components:   RBC 3.61 (*)    Hemoglobin 9.8 (*)    HCT 32.1 (*)    All other components within normal limits  TROPONIN I (HIGH SENSITIVITY) - Abnormal; Notable for the following components:   Troponin I (High Sensitivity) 111 (*)    All other components within normal limits  TROPONIN I (HIGH SENSITIVITY)    EKG EKG Interpretation  Date/Time:  Sunday June 21 2020 20:15:19 EST Ventricular Rate:  117 PR Interval:    QRS Duration: 82 QT Interval:  333 QTC Calculation: 465 R Axis:   61 Text Interpretation: Atrial  fibrillation Ventricular premature complex Left ventricular hypertrophy Repol abnrm, severe global ischemia (LM/MVD) No significant change since last tracing Confirmed by Dorie Rank [phone]) on 06/21/2020 8:17:05 PM   Radiology DG Chest 2 View  Result Date: 06/21/2020 CLINICAL DATA:  Chest pain EXAM: CHEST - 2 VIEW COMPARISON:  July 14, 2017 FINDINGS: The heart size and mediastinal contours are within normal limits. Lungs are hyperexpanded both lungs are clear. The visualized skeletal structures are unremarkable. Aortic calcifications are noted. IMPRESSION: No active cardiopulmonary disease. Electronically Signed   By: Constance Holster M.D.   On: 06/21/2020 19:44    Procedures .Critical Care Performed by: Dorie Rank, MD Authorized by: Dorie Rank, MD   Critical care provider statement:    Critical care time (minutes):  35   Critical care was time spent personally by me on the following activities:  Discussions with consultants, evaluation of patient's response to treatment, examination of patient, ordering and performing treatments and interventions, ordering and review of laboratory studies, ordering and review of radiographic studies, pulse oximetry, re-evaluation of patient's condition, obtaining history from patient or surrogate and review of old charts     Medications Ordered in ED Medications  heparin bolus via infusion 3,000 Units (has no administration in time range)  heparin ADULT infusion 100 units/mL (25000 units/266mL) (has no administration in time range)  aspirin chewable tablet 324 mg (324 mg Oral Given 06/21/20 2042)  diltiazem (CARDIZEM) injection 10 mg (10 mg Intravenous Given 06/21/20 2043)    ED Course  I have reviewed the triage vital signs and the nursing notes.  Pertinent labs & imaging results that were available during my care of the patient were reviewed by me and considered in my medical decision making (see chart for details).    MDM Rules/Calculators/A&P                          Patient presents to the ED  with complaints of chest pain and irregular heart rhythm.  Patient's ED evaluation is notable for new onset atrial fibrillation on his EKG.  Patient is currently pain-free but he does have an elevated troponin at 111.  His EKG also is concerning for ischemic changes.  It is possible these are rate related.  Prior cardiology notes reviewed.  History of nonsustained V. tach but I do not see history of CAD.  Symptoms are concerning however for the possibility of non-ST elevation MI.  I have ordered aspirin and heparin.  He is also been given Cardizem for his tachycardia.  We will consult with cardiology  Final Clinical Impression(s) / ED Diagnoses Final diagnoses:  Atrial fibrillation with RVR ([place])  Elevated troponin      Dorie Rank, MD 06/21/20 2050

## 2020-06-21 NOTE — Progress Notes (Signed)
ANTICOAGULATION CONSULT NOTE - Initial Consult  Pharmacy Consult for heparin Indication: atrial fibrillation  Allergies  Allergen Reactions  . Clarithromycin     REACTION: GI upset    Patient Measurements: Height: 6' (182.9 cm) Weight: 61.2 kg (135 lb) IBW/kg (Calculated) : 77.6 Heparin Dosing Weight: 61.2 kg   Vital Signs: Temp: 98.3 F (36.8 C) (02/13 1856) Temp Source: Oral (02/13 1856) BP: 119/60 (02/13 2045) Pulse Rate: 109 (02/13 2045)  Labs: Recent Labs    06/21/20 1906  HGB 9.8*  HCT 32.1*  PLT 300  CREATININE 0.88  TROPONINIHS 111*    Estimated Creatinine Clearance: 52.2 mL/min (by C-G formula based on SCr of 0.88 mg/dL).   Medical History: Past Medical History:  Diagnosis Date  . Acquired hypothyroidism 08/15/2019  . Anemia 02/05/2020   [place] anemic  . Atopic dermatitis 12/05/2019  . Bacterial pneumonia, unspecified 06/14/2012   06/14/2012 CXR w/ RML PNA >Zithromax and Rocephin    . Bradycardia 11/28/2018  . Cancer [facility])    prostate cancer-seed implant  . Closed fracture dislocation of right elbow   . COPD (chronic obstructive pulmonary disease) ([place]) 01/21/2020  . Dermatitis 02/05/2020  . Dizziness 02/19/2016  . Essential hypertension 11/28/2018  . History of prostate cancer    seed implant  . Hyperlipemia   . Hyperlipidemia, mixed 08/21/2008   Qualifier: Diagnosis of  By: Annamaria Boots MD, Clinton D   . LVH (left ventricular hypertrophy) 11/28/2018  . Malnutrition of moderate degree ([place]) 08/15/2019  . Mixed conductive and sensorineural hearing loss, 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  . Moderate persistent asthma, uncomplicated 5/57/3220  . Nonsustained ventricular tachycardia ([place]) 01/30/2019  . Other fatigue 02/05/2020  . Seasonal and perennial allergic rhinitis 05/17/2007      . Senile osteoporosis 08/15/2019  . SINUSITIS, ACUTE 06/28/2007   Qualifier: Diagnosis of  By: Royal Piedra NP, Tammy    . Ventricular ectopy 11/28/2018    Medications:  Scheduled:  . diltiazem  30  mg Oral QID  . heparin  3,000 Units Intravenous Once    Assessment: 3 yom presenting with CP and SOB - found to have Afib - no AC PTA.  Hgb 9.8, plt 300. Trop 276. No s/sx of bleeding.   Goal of Therapy:  Heparin level 0.3-0.7 units/ml Monitor platelets by anticoagulation protocol: Yes   Plan:  Give 3000 units bolus x 1 Start heparin infusion at 850 units/hr Check anti-Xa level in 8 hours and daily while on heparin Continue to monitor H&H and platelets  [name], PharmD, [place] Pharmacist  Phone: [phone] 06/21/2020 10:07 PM  Please check AMION for all [place] phone numbers After 10:00 PM, call [place] [phone]

## 2020-06-22 ENCOUNTER — Encounter (HOSPITAL_COMMUNITY): Payer: Self-pay | Admitting: Internal Medicine

## 2020-06-22 ENCOUNTER — Encounter (HOSPITAL_COMMUNITY)
Admission: EM | Disposition: A | Discharge status: Home Health | Payer: Self-pay | Source: Home / Self Care | Attending: Cardiology

## 2020-06-22 ENCOUNTER — Inpatient Hospital Stay (HOSPITAL_COMMUNITY): Payer: Medicare HMO

## 2020-06-22 DIAGNOSIS — I4891 Unspecified atrial fibrillation: Secondary | ICD-10-CM

## 2020-06-22 DIAGNOSIS — I214 Non-ST elevation (NSTEMI) myocardial infarction: Principal | ICD-10-CM

## 2020-06-22 DIAGNOSIS — I2510 Atherosclerotic heart disease of native coronary artery without angina pectoris: Secondary | ICD-10-CM

## 2020-06-22 DIAGNOSIS — D649 Anemia, unspecified: Secondary | ICD-10-CM

## 2020-06-22 HISTORY — PX: LEFT HEART CATH AND CORONARY ANGIOGRAPHY: CATH118249

## 2020-06-22 LAB — HEMOGLOBIN A1C
Hgb A1c MFr Bld: 6.3 % — ABNORMAL HIGH (ref 4.8–5.6)
Mean Plasma Glucose: 134.11 mg/dL

## 2020-06-22 LAB — CBC
HCT: 27 % — ABNORMAL LOW (ref 39.0–52.0)
Hemoglobin: 8.2 g/dL — ABNORMAL LOW (ref 13.0–17.0)
MCH: 26.8 pg (ref 26.0–34.0)
MCHC: 30.4 g/dL (ref 30.0–36.0)
MCV: 88.2 fL (ref 80.0–100.0)
Platelets: 249 10*3/uL (ref 150–400)
RBC: 3.06 MIL/uL — ABNORMAL LOW (ref 4.22–5.81)
RDW: 13.2 % (ref 11.5–15.5)
WBC: 13.2 10*3/uL — ABNORMAL HIGH (ref 4.0–10.5)
nRBC: 0 % (ref 0.0–0.2)

## 2020-06-22 LAB — BASIC METABOLIC PANEL
Anion gap: 8 (ref 5–15)
BUN: 18 mg/dL (ref 8–23)
CO2: 23 mmol/L (ref 22–32)
Calcium: 8 mg/dL — ABNORMAL LOW (ref 8.9–10.3)
Chloride: 105 mmol/L (ref 98–111)
Creatinine, Ser: 0.76 mg/dL (ref 0.61–1.24)
GFR, Estimated: >60 mL/min (ref >60)
Glucose, Bld: 94 mg/dL (ref 70–99)
Potassium: 3.8 mmol/L (ref 3.5–5.1)
Sodium: 136 mmol/L (ref 135–145)

## 2020-06-22 LAB — TROPONIN I (HIGH SENSITIVITY): Troponin I (High Sensitivity): 3745 ng/L (ref <18)

## 2020-06-22 LAB — HEPARIN LEVEL (UNFRACTIONATED): Heparin Unfractionated: 0.31 IU/mL (ref 0.30–0.70)

## 2020-06-22 SURGERY — LEFT HEART CATH AND CORONARY ANGIOGRAPHY
Anesthesia: LOCAL

## 2020-06-22 MED ORDER — POTASSIUM CHLORIDE 10 MEQ/100ML IV SOLN
10.0000 meq | INTRAVENOUS | Status: AC
  Administered 2020-06-22 (×3): 10 meq via INTRAVENOUS
  Filled 2020-06-22 (×4): qty 100

## 2020-06-22 MED ORDER — VERAPAMIL HCL 2.5 MG/ML IV SOLN
INTRAVENOUS | Status: DC | PRN
  Administered 2020-06-22: 10 mL via INTRA_ARTERIAL

## 2020-06-22 MED ORDER — HEPARIN SODIUM (PORCINE) 1000 UNIT/ML IJ SOLN
INTRAMUSCULAR | Status: DC | PRN
  Administered 2020-06-22: 3000 [IU] via INTRAVENOUS

## 2020-06-22 MED ORDER — LIDOCAINE HCL (PF) 1 % IJ SOLN
INTRAMUSCULAR | Status: AC
  Filled 2020-06-22: qty 30

## 2020-06-22 MED ORDER — POTASSIUM CHLORIDE CRYS ER 20 MEQ PO TBCR
40.0000 meq | EXTENDED_RELEASE_TABLET | Freq: Once | ORAL | Status: AC
  Administered 2020-06-22: 40 meq via ORAL
  Filled 2020-06-22: qty 2

## 2020-06-22 MED ORDER — SODIUM CHLORIDE 0.9% FLUSH: 3.0000 mL | INTRAVENOUS | Status: DC | PRN

## 2020-06-22 MED ORDER — HEPARIN SODIUM (PORCINE) 1000 UNIT/ML IJ SOLN
INTRAMUSCULAR | Status: AC
  Filled 2020-06-22: qty 1

## 2020-06-22 MED ORDER — HALOPERIDOL LACTATE 5 MG/ML IJ SOLN
1.0000 mg | Freq: Once | INTRAMUSCULAR | Status: AC
  Administered 2020-06-22: 1 mg via INTRAVENOUS
  Filled 2020-06-22: qty 1

## 2020-06-22 MED ORDER — LABETALOL HCL 5 MG/ML IV SOLN: 10.0000 mg | INTRAVENOUS | Status: AC | PRN

## 2020-06-22 MED ORDER — HYDRALAZINE HCL 20 MG/ML IJ SOLN: 10.0000 mg | INTRAMUSCULAR | Status: AC | PRN

## 2020-06-22 MED ORDER — IOHEXOL 350 MG/ML SOLN
INTRAVENOUS | Status: DC | PRN
  Administered 2020-06-22: 45 mL

## 2020-06-22 MED ORDER — ASPIRIN 81 MG PO CHEW
81.0000 mg | CHEWABLE_TABLET | Freq: Every day | ORAL | Status: DC
  Administered 2020-06-22 – 2020-06-23 (×2): 81 mg via ORAL
  Filled 2020-06-22 (×2): qty 1

## 2020-06-22 MED ORDER — HEPARIN (PORCINE) 25000 UT/250ML-% IV SOLN
1250.0000 [IU]/h | INTRAVENOUS | Status: DC
  Administered 2020-06-22 – 2020-06-23 (×2): 850 [IU]/h via INTRAVENOUS
  Administered 2020-06-24: 1250 [IU]/h via INTRAVENOUS
  Filled 2020-06-22 (×4): qty 250

## 2020-06-22 MED ORDER — VERAPAMIL HCL 2.5 MG/ML IV SOLN
INTRAVENOUS | Status: AC
  Filled 2020-06-22: qty 2

## 2020-06-22 MED ORDER — SODIUM CHLORIDE 0.9 % WEIGHT BASED INFUSION: 3.0000 mL/kg/h | INTRAVENOUS | Status: DC

## 2020-06-22 MED ORDER — LIDOCAINE HCL (PF) 1 % IJ SOLN
INTRAMUSCULAR | Status: DC | PRN
  Administered 2020-06-22: 2 mL

## 2020-06-22 MED ORDER — SODIUM CHLORIDE 0.9 % IV SOLN: 250.0000 mL | INTRAVENOUS | Status: DC | PRN

## 2020-06-22 MED ORDER — SODIUM CHLORIDE 0.9% FLUSH
3.0000 mL | Freq: Two times a day (BID) | INTRAVENOUS | Status: DC
  Administered 2020-06-22: 3 mL via INTRAVENOUS

## 2020-06-22 MED ORDER — SODIUM CHLORIDE 0.9% FLUSH
3.0000 mL | Freq: Two times a day (BID) | INTRAVENOUS | Status: DC
  Administered 2020-06-22 – 2020-06-25 (×5): 3 mL via INTRAVENOUS

## 2020-06-22 MED ORDER — ATORVASTATIN CALCIUM 80 MG PO TABS
80.0000 mg | ORAL_TABLET | Freq: Every day | ORAL | Status: DC
  Administered 2020-06-22 – 2020-06-25 (×4): 80 mg via ORAL
  Filled 2020-06-22 (×4): qty 1

## 2020-06-22 MED ORDER — HEPARIN (PORCINE) IN NACL 1000-0.9 UT/500ML-% IV SOLN
INTRAVENOUS | Status: AC
  Filled 2020-06-22: qty 1000

## 2020-06-22 MED ORDER — HEPARIN (PORCINE) IN NACL 1000-0.9 UT/500ML-% IV SOLN
INTRAVENOUS | Status: DC | PRN
  Administered 2020-06-22 (×2): 500 mL

## 2020-06-22 MED ORDER — ACETAMINOPHEN 325 MG PO TABS
650.0000 mg | ORAL_TABLET | Freq: Four times a day (QID) | ORAL | Status: DC | PRN
  Administered 2020-06-22: 16:00:00 650 mg via ORAL
  Filled 2020-06-22 (×2): qty 2

## 2020-06-22 MED ORDER — SODIUM CHLORIDE 0.9 % WEIGHT BASED INFUSION
1.0000 mL/kg/h | INTRAVENOUS | Status: DC
  Administered 2020-06-22: 1 mL/kg/h via INTRAVENOUS

## 2020-06-22 SURGICAL SUPPLY — 13 items
BAG SNAP BAND KOVER 36X36 (MISCELLANEOUS) ×2 IMPLANT
CATH 5FR JL3.5 JR4 ANG PIG MP (CATHETERS) ×2 IMPLANT
COVER DOME SNAP 22 D (MISCELLANEOUS) ×2 IMPLANT
DEVICE RAD COMP TR BAND LRG (VASCULAR PRODUCTS) ×2 IMPLANT
DEVICE RAD TR BAND REGULAR (VASCULAR PRODUCTS) ×2 IMPLANT
GLIDESHEATH SLEND SS 6F .021 (SHEATH) ×2 IMPLANT
GUIDEWIRE INQWIRE 1.5J.035X260 (WIRE) ×1 IMPLANT
INQWIRE 1.5J .035X260CM (WIRE) ×2
KIT HEART LEFT (KITS) ×2 IMPLANT
PACK CARDIAC CATHETERIZATION (CUSTOM PROCEDURE TRAY) ×2 IMPLANT
TRANSDUCER W/STOPCOCK (MISCELLANEOUS) ×2 IMPLANT
TUBING CIL FLEX 10 FLL-RA (TUBING) ×2 IMPLANT
WIRE HI TORQ VERSACORE-J 145CM (WIRE) ×2 IMPLANT

## 2020-06-22 NOTE — ED Notes (Signed)
Pt assisted to bedside commode

## 2020-06-22 NOTE — H&P (View-Only) (Signed)
Progress Note  Patient Name: James Orcutt Sr. Date of Encounter: 06/22/2020  [facility] Cardiologist: No primary care provider on file.  Subjective   No chest pain this morning  Very HOH, but son at the bedside assisting with history. He denies any obvious blood in his stool, but says his PCP had recently done blood work and asked that he bring in stool cards  Inpatient Medications    Scheduled Meds:  aspirin  81 mg Oral Daily   atorvastatin  80 mg Oral Daily   levothyroxine  150 mcg Oral Q0600   metoprolol succinate  25 mg Oral Daily   Continuous Infusions:  heparin 850 Units/hr (06/22/20 0221)   PRN Meds: albuterol, ipratropium-albuterol   Vital Signs    Vitals:   06/22/20 0400 06/22/20 0500 06/22/20 0638 06/22/20 0656  BP: 103/70 112/77 118/72   Pulse: 80 82 (!) 25   Resp: 18 (!) 21 12   Temp:    98 F (36.7 C)  TempSrc:    Oral  SpO2: 100% 100% 96%   Weight:      Height:       No intake or output data in the 24 hours ending 06/22/20 0841 Last 3 Weights 06/21/2020 06/16/2020 04/10/2020  Weight (lbs) 135 lb 131 lb 6.4 oz 121 lb 7.6 oz  Weight (kg) 61.236 kg 59.603 kg 55.1 kg      Telemetry    Afib RVR-->conversion to SR with PACs - Personally Reviewed  ECG    Afib RVR with ST depression in lateral leads   SR with ST depression in lateral leads, PVCs - Personally Reviewed  Physical Exam  Pleasant, HOH older WM GEN: No acute distress.   Neck: No JVD Cardiac: RRR, no murmurs, rubs, or gallops.  Respiratory: Clear to auscultation bilaterally. GI: Soft, nontender, non-distended  MS: No edema; No deformity. Neuro:  Nonfocal  Psych: Normal affect   Labs    High Sensitivity Troponin:   Recent Labs  Lab 06/21/20 1906 06/21/20 2040 06/22/20 0513  TROPONINIHS 111* 276* 3,745*      Chemistry Recent Labs  Lab 06/16/20 1105 06/21/20 1906 06/22/20 0513  NA 138 135 136  K 4.7 3.0* 3.8  CL 102 102 105  CO2 21 20* 23  GLUCOSE 93 204* 94   BUN 15 22 18   CREATININE 0.90 0.88 0.76  CALCIUM 9.4 8.6* 8.0*  PROT 6.4  --   --   ALBUMIN 4.1  --   --   AST 18  --   --   ALT 13  --   --   ALKPHOS 69  --   --   BILITOT 0.3  --   --   GFRNONAA 77 >60 >60  GFRAA 89  --   --   ANIONGAP  --  13 8     Hematology Recent Labs  Lab 06/16/20 1105 06/21/20 1906 06/22/20 0513  WBC 8.5 10.1 13.2*  RBC 3.79* 3.61* 3.06*  HGB 10.3* 9.8* 8.2*  HCT 32.7* 32.1* 27.0*  MCV 86 88.9 88.2  MCH 27.2 27.1 26.8  MCHC 31.5 30.5 30.4  RDW 12.8 13.0 13.2  PLT 301 300 249    BNPNo results for input(s): BNP, PROBNP in the last 168 hours.   DDimer No results for input(s): DDIMER in the last 168 hours.   Radiology    DG Chest 2 View  Result Date: 06/21/2020 CLINICAL DATA:  Chest pain EXAM: CHEST - 2 VIEW COMPARISON:  July 14, 2017 FINDINGS: The heart size and mediastinal contours are within normal limits. Lungs are hyperexpanded both lungs are clear. The visualized skeletal structures are unremarkable. Aortic calcifications are noted. IMPRESSION: No active cardiopulmonary disease. Electronically Signed   By: Constance Holster M.D.   On: 06/21/2020 19:44    Cardiac Studies   Echo: pending  Patient Profile     87 y.o. male with PMH of NSVT, COPD, Hypothyroidism, and HTN who presented with chest pain and new onset Afib  Assessment & Plan    1. NSTEMI: Reports developing chest pain yesterday that was intermittent throughout the day with associated shortness of breath. hsTn rose to 3745. EKG on admission showed afib RVR with ST depression in lateral leads which persisted once he had converted to SR. No chest pain at time of assessment.  -- he is also noted to be more anemic this morning with hgb dropping from 9.8>>8.2 -- will review with MD, but suspect will need to undergo diagnostic cath to rule out obstructive CAD -- on ASA, statin, and BB  2. New onset afib RVR: denies any specific palpitations prior to admission, suspect his  shortness of breath was related to Afib.  -- given IV diltiazem IVP and converted to SR, maintaining at present -- currently on IV heparin -- CHA2DS2-VASc Score of at least 3, will need [place] pending clinic work up  3. Anemia: Hgb 9.8>>8.2 this morning. Reports having blood done recently through PCP office and asked to check stool cards.  -- denies any obvious bleeding or dark tarry stools prior to admission -- says he does not recall ever having had a colonoscopy  -- check FOBT -- suspect may need GI input prior to starting [place]  4. Hypokalemia: K+ 3.8 this morning. Has received 3 runs of IV K+, and 32meq PO -- BMET in am  5. HTN: controlled on current regimen -- continue Toprol XL 25mg  daily    For questions or updates, please contact [place] Please consult [email or web address] for contact info under        Signed, Reino Bellis, NP  06/22/2020, 8:41 AM    Agree with note by Reino Bellis NP-C  Mr. Hinderman was admitted with a non-STEMI.  He was awakened at night with chest pain.  He was admitted with A. fib with RVR which converted with IV Diltia to sinus rhythm.  His enzymes rose to the 3000 level.  He currently has no acute changes on his EKG and is pain-free.  His exam is benign.  He does have positive risk factors.  I favor diagnostic coronary angiography to define his anatomy.  I have reviewed the risks, indications, and alternatives to cardiac catheterization, possible angioplasty, and stenting with the patient. Risks include but are not limited to bleeding, infection, vascular injury, stroke, myocardial infection, arrhythmia, kidney injury, radiation-related injury in the case of prolonged fluoroscopy use, emergency cardiac surgery, and death. The patient understands the risks of serious complication is 1-2 in 1610 with diagnostic cardiac cath and 1-2% or less with angioplasty/stenting.     Lorretta Harp, M.D., [place], [facility], [place] [address] [place], [place]  [address]  [phone] 06/22/2020 10:09 AM

## 2020-06-22 NOTE — Progress Notes (Signed)
Next troponin noted to be 3745, consistent with ischemic insult. Per d/w nurse patient is asymptomatic. VSS. Hemoglobin also noted to be downtrending slightly from prior with no obvious evidence for bleeding reported so placed order for hemoccult. Will discuss with rounding team to see early to decide next steps. Orphia Mctigue PA-C

## 2020-06-22 NOTE — CV Procedure (Signed)
2D echo attempted, but patient bleeding from pressure band, Will try echo later.

## 2020-06-22 NOTE — Progress Notes (Signed)
[place] for heparin Indication: chest pain/ACS and atrial fibrillation  Allergies  Allergen Reactions  . Clarithromycin     REACTION: GI upset    Patient Measurements: Height: 6' (182.9 cm) Weight: 63.2 kg (139 lb 5.3 oz) IBW/kg (Calculated) : 77.6 Heparin Dosing Weight: 61.2 kg   Vital Signs: Temp: 97.6 F (36.4 C) (02/14 1400) Temp Source: Oral (02/14 1400) BP: 120/94 (02/14 1415) Pulse Rate: 69 (02/14 1415)  Labs: Recent Labs    06/21/20 1906 06/21/20 2040 06/22/20 0513 06/22/20 0648  HGB 9.8*  --  8.2*  --   HCT 32.1*  --  27.0*  --   PLT 300  --  249  --   HEPARINUNFRC  --   --   --  0.31  CREATININE 0.88  --  0.76  --   TROPONINIHS 111* 276* 3,745*  --     Estimated Creatinine Clearance: 59.3 mL/min (by C-G formula based on SCr of 0.76 mg/dL).  Assessment: 87 y.o. male with chest pain and Afib for heparin.   S/p cath today, found severe single-vessel coronary artery disease with heavily calcified mid LAD stenosis of up to 90%. Pending further workup of declining hemoglobin before considering further interventions. To restart heparin 4h after sheath removal.   Hgb dropped from 9.8 to 8.2 today. Patient has a history of blood in stools but denies denies bleeding or dark tarry stools currently.   Goal of Therapy:  Heparin level 0.3-0.7 units/ml Monitor platelets by anticoagulation protocol: Yes   Plan:  Restart IV heparin at 850 units/hr today at 1800 (4h post sheath removal) Daily heparin level/CBC Monitor s/sx bleeding  Rebbeca Paul, PharmD PGY1 Pharmacy Resident 06/22/2020 3:14 PM  Please check [email or web address] for unit-specific pharmacy phone numbers.

## 2020-06-22 NOTE — Progress Notes (Addendum)
Progress Note  Patient Name: James Smoak Sr. Date of Encounter: 06/22/2020  [facility] Cardiologist: No primary care provider on file.  Subjective   No chest pain this morning  Very HOH, but son at the bedside assisting with history. He denies any obvious blood in his stool, but says his PCP had recently done blood work and asked that he bring in stool cards  Inpatient Medications    Scheduled Meds:  aspirin  81 mg Oral Daily   atorvastatin  80 mg Oral Daily   levothyroxine  150 mcg Oral Q0600   metoprolol succinate  25 mg Oral Daily   Continuous Infusions:  heparin 850 Units/hr (06/22/20 0221)   PRN Meds: albuterol, ipratropium-albuterol   Vital Signs    Vitals:   06/22/20 0400 06/22/20 0500 06/22/20 0638 06/22/20 0656  BP: 103/70 112/77 118/72   Pulse: 80 82 (!) 25   Resp: 18 (!) 21 12   Temp:    98 F (36.7 C)  TempSrc:    Oral  SpO2: 100% 100% 96%   Weight:      Height:       No intake or output data in the 24 hours ending 06/22/20 0841 Last 3 Weights 06/21/2020 06/16/2020 04/10/2020  Weight (lbs) 135 lb 131 lb 6.4 oz 121 lb 7.6 oz  Weight (kg) 61.236 kg 59.603 kg 55.1 kg      Telemetry    Afib RVR-->conversion to SR with PACs - Personally Reviewed  ECG    Afib RVR with ST depression in lateral leads   SR with ST depression in lateral leads, PVCs - Personally Reviewed  Physical Exam  Pleasant, HOH older WM GEN: No acute distress.   Neck: No JVD Cardiac: RRR, no murmurs, rubs, or gallops.  Respiratory: Clear to auscultation bilaterally. GI: Soft, nontender, non-distended  MS: No edema; No deformity. Neuro:  Nonfocal  Psych: Normal affect   Labs    High Sensitivity Troponin:   Recent Labs  Lab 06/21/20 1906 06/21/20 2040 06/22/20 0513  TROPONINIHS 111* 276* 3,745*      Chemistry Recent Labs  Lab 06/16/20 1105 06/21/20 1906 06/22/20 0513  NA 138 135 136  K 4.7 3.0* 3.8  CL 102 102 105  CO2 21 20* 23  GLUCOSE 93 204* 94   BUN 15 22 18   CREATININE 0.90 0.88 0.76  CALCIUM 9.4 8.6* 8.0*  PROT 6.4  --   --   ALBUMIN 4.1  --   --   AST 18  --   --   ALT 13  --   --   ALKPHOS 69  --   --   BILITOT 0.3  --   --   GFRNONAA 77 >60 >60  GFRAA 89  --   --   ANIONGAP  --  13 8     Hematology Recent Labs  Lab 06/16/20 1105 06/21/20 1906 06/22/20 0513  WBC 8.5 10.1 13.2*  RBC 3.79* 3.61* 3.06*  HGB 10.3* 9.8* 8.2*  HCT 32.7* 32.1* 27.0*  MCV 86 88.9 88.2  MCH 27.2 27.1 26.8  MCHC 31.5 30.5 30.4  RDW 12.8 13.0 13.2  PLT 301 300 249    BNPNo results for input(s): BNP, PROBNP in the last 168 hours.   DDimer No results for input(s): DDIMER in the last 168 hours.   Radiology    DG Chest 2 View  Result Date: 06/21/2020 CLINICAL DATA:  Chest pain EXAM: CHEST - 2 VIEW COMPARISON:  July 14, 2017 FINDINGS: The heart size and mediastinal contours are within normal limits. Lungs are hyperexpanded both lungs are clear. The visualized skeletal structures are unremarkable. Aortic calcifications are noted. IMPRESSION: No active cardiopulmonary disease. Electronically Signed   By: Constance Holster M.D.   On: 06/21/2020 19:44    Cardiac Studies   Echo: pending  Patient Profile     87 y.o. male with PMH of NSVT, COPD, Hypothyroidism, and HTN who presented with chest pain and new onset Afib  Assessment & Plan    1. NSTEMI: Reports developing chest pain yesterday that was intermittent throughout the day with associated shortness of breath. hsTn rose to 3745. EKG on admission showed afib RVR with ST depression in lateral leads which persisted once he had converted to SR. No chest pain at time of assessment.  -- he is also noted to be more anemic this morning with hgb dropping from 9.8>>8.2 -- will review with MD, but suspect will need to undergo diagnostic cath to rule out obstructive CAD -- on ASA, statin, and BB  2. New onset afib RVR: denies any specific palpitations prior to admission, suspect his  shortness of breath was related to Afib.  -- given IV diltiazem IVP and converted to SR, maintaining at present -- currently on IV heparin -- CHA2DS2-VASc Score of at least 3, will need [place] pending clinic work up  3. Anemia: Hgb 9.8>>8.2 this morning. Reports having blood done recently through PCP office and asked to check stool cards.  -- denies any obvious bleeding or dark tarry stools prior to admission -- says he does not recall ever having had a colonoscopy  -- check FOBT -- suspect may need GI input prior to starting [place]  4. Hypokalemia: K+ 3.8 this morning. Has received 3 runs of IV K+, and 81meq PO -- BMET in am  5. HTN: controlled on current regimen -- continue Toprol XL 25mg  daily    For questions or updates, please contact [place] Please consult [email or web address] for contact info under        Signed, Reino Bellis, NP  06/22/2020, 8:41 AM    Agree with note by Reino Bellis NP-C  Mr. Borak was admitted with a non-STEMI.  He was awakened at night with chest pain.  He was admitted with A. fib with RVR which converted with IV Diltia to sinus rhythm.  His enzymes rose to the 3000 level.  He currently has no acute changes on his EKG and is pain-free.  His exam is benign.  He does have positive risk factors.  I favor diagnostic coronary angiography to define his anatomy.  I have reviewed the risks, indications, and alternatives to cardiac catheterization, possible angioplasty, and stenting with the patient. Risks include but are not limited to bleeding, infection, vascular injury, stroke, myocardial infection, arrhythmia, kidney injury, radiation-related injury in the case of prolonged fluoroscopy use, emergency cardiac surgery, and death. The patient understands the risks of serious complication is 1-2 in 1610 with diagnostic cardiac cath and 1-2% or less with angioplasty/stenting.     Lorretta Harp, M.D., [place], [facility], [place] [address] [place], [place]  [address]  [phone] 06/22/2020 10:09 AM

## 2020-06-22 NOTE — Consult Note (Addendum)
[facility] Consult: 3:24 PM 06/22/2020  LOS: 1 day    Referring Provider: Dr End  Primary Care Physician:  [name], MD Primary Gastroenterologist: Dr Lyndel Safe.     Reason for Consultation: Anemia.  MCV normal.     HPI: James Bostic Sr. is a 87 y.o. male.  PMH outlined below.  Pertinents include prostate cancer treated with radiation seed implantation.  COPD.  Bradycardia.  Hypothyroidism.  S/p appendectomy.  Hearing impairment. Remote colon polyps 03/2013 Colonoscopy: severe sigmoid tics w luminal narrowing.  Small, non-bleeding int rrhoids.    Arrived via [facility] with chest pain, new A. fib with RVR..  Ruled in for NSTEMI w trops to 3745 this AM.  Hyperkalemia addressed with IV and oral potassium. Underwent cardiac catheterization via transradial approach earlier today.  It shows severe single-vessel CAD with heavily calcified, 90% stenosed lesion in mid LAD.  Other mild to moderate disease.  LVEF moderately to severely reduced.  No intervention performed due to anemia but MD recommends PCI/atherectomy to lesion in LAD if there is no active bleeding and Hb stabilizes. Patient dislodged the transradial band applying pressure to the cath access site on his right wrist and there was some bleeding which has resolved.  IV Heparin was discontinued at 2 PM today.  Hgb 9.8 >> 8.2.  It was 10.3 a week ago, 11.6 six months ago.  MCV 88.  Normal platelets Renal function normal.  LFTs normal.   CXR negative for acute cardiopulmonary disease.  No GI imaging.  Last Tuesday, 6 d ago, dermatologist started him on a prednisone taper for a rash on his back.  No PPI or H2 blockers, no aspirin or NSAIDs PTA.  No ETOH.   GI wise has no dysphagia.  No anorexia, nausea, weight loss, abdominal pain.  No bloody  or black stools.  At recent routine lab work he was slightly anemic so his family physician was going to be sending him some stool Hemoccult cards to complete at home and submit for testing.  Family history positive for diabetes in his mother, Alzheimer's dementia in his father.  Past Medical History:  Diagnosis Date  . Acquired hypothyroidism 08/15/2019  . Anemia 02/05/2020   boarderline anemic  . Atopic dermatitis 12/05/2019  . Bacterial pneumonia, unspecified 06/14/2012   06/14/2012 CXR w/ RML PNA >Zithromax and Rocephin    . Bradycardia 11/28/2018  . Cancer [facility])    prostate cancer-seed implant  . Closed fracture dislocation of right elbow   . COPD (chronic obstructive pulmonary disease) ([place]) 01/21/2020  . Dermatitis 02/05/2020  . Dizziness 02/19/2016  . Essential hypertension 11/28/2018  . History of prostate cancer    seed implant  . Hyperlipemia   . Hyperlipidemia, mixed 08/21/2008   Qualifier: Diagnosis of  By: Annamaria Boots MD, Clinton D   . LVH (left ventricular hypertrophy) 11/28/2018  . Malnutrition of moderate degree ([place]) 08/15/2019  . Mixed conductive and sensorineural hearing loss, 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  . Moderate persistent asthma, uncomplicated 9/98/3382  . Nonsustained ventricular tachycardia ([place]) 01/30/2019  .  Other fatigue 02/05/2020  . Seasonal and perennial allergic rhinitis 05/17/2007      . Senile osteoporosis 08/15/2019  . SINUSITIS, ACUTE 06/28/2007   Qualifier: Diagnosis of  By: Royal Piedra NP, Tammy    . Ventricular ectopy 11/28/2018    Past Surgical History:  Procedure Laterality Date  . APPENDECTOMY    . ORIF ELBOW FRACTURE Right 04/10/2020   Procedure: OPEN REDUCTION INTERNAL FIXATION (ORIF) ELBOW/OLECRANON FRACTURE;  Surgeon: Marchia Bond, MD;  Location: [place];  Service: Orthopedics;  Laterality: Right;    Prior to Admission medications   Medication Sig Start Date End Date Taking? Authorizing Provider  acetaminophen (TYLENOL) 325 MG tablet Take 2  tablets (650 mg total) by mouth every 6 (six) hours as needed. 04/10/20  Yes Merlene Pulling K, PA-C  albuterol (VENTOLIN HFA) 108 (90 Base) MCG/ACT inhaler INHALE 2 PUFFS INTO THE LUNGS EVERY 6 HOURS AS NEEDED FOR WHEEZING OR SHORTNESS OF BREATH Patient taking differently: Inhale 2 puffs into the lungs every 6 (six) hours as needed for wheezing or shortness of breath. INHALE 2 PUFFS INTO THE LUNGS EVERY 6 HOURS AS NEEDED FOR WHEEZING OR SHORTNESS OF BREATH 01/06/20  Yes Young, Clinton D, MD  alendronate (FOSAMAX) 70 MG tablet Take 70 mg by mouth once a week. Friday 04/06/20  Yes [provider]  Fexofenadine HCl (ALLEGRA PO) Take 1 tablet by mouth daily as needed (allergies).   Yes [provider]  ipratropium-albuterol (DUONEB) 0.5-2.5 (3) MG/3ML SOLN Take 3 mLs by nebulization every 4 (four) hours as needed (shortness of breath or wheezing).   Yes [provider]  Multiple Vitamins-Minerals (MULTIVITAMIN ADULT PO) Take 1 tablet by mouth daily.    Yes [provider]  Nutritional Supplements (BOOST NUTRITIONAL ENERGY PO) Take 237 mLs by mouth in the morning and at bedtime. Drinking 1-2 per day   Yes [provider]  predniSONE (DELTASONE) 10 MG tablet Take 10-40 mg by mouth as directed. Started on 06-16-20 16 DS # 34 06/16/20  Yes [provider]  SYNTHROID 150 MCG tablet TAKE 1 TABLET BY MOUTH DAILY BEFORE BREAKFAST. Patient taking differently: Take 150 mcg by mouth daily before breakfast. 04/09/20  Yes [name], MD  triamcinolone (KENALOG) 0.1 % Apply 1 application topically 2 (two) times daily as needed (rash). 06/08/20  Yes [provider]  metoprolol succinate (TOPROL-XL) 25 MG 24 hr tablet Take 1 tablet (25 mg total) by mouth daily. Take with or immediately following a meal. Patient not taking: No sig reported 01/14/20 10/10/20  Park Liter, MD    Scheduled Meds: . aspirin  81 mg Oral Daily  . atorvastatin  80 mg Oral  Daily  . levothyroxine  150 mcg Oral Q0600  . metoprolol succinate  25 mg Oral Daily  . sodium chloride flush  3 mL Intravenous Q12H   Infusions: . sodium chloride    . heparin     PRN Meds: sodium chloride, albuterol, hydrALAZINE, ipratropium-albuterol, labetalol, sodium chloride flush   Allergies as of 06/21/2020 - Review Complete 06/16/2020  Allergen Reaction Noted  . Clarithromycin  06/14/2012    Family History  Problem Relation Age of Onset  . Alzheimer's disease Father   . Diabetes Mother     Social History   Socioeconomic History  . Marital status: Married    Spouse name: Not on file  . Number of children: Not on file  . Years of education: Not on file  . Highest education level:  Not on file  Occupational History  . Occupation: Retired    Fish farm manager: OTHER    Comment: Scientist, research (life sciences)  Tobacco Use  . Smoking status: Former Smoker    Types: Cigarettes    Quit date: 1960    Years since quitting: 62.1  . Smokeless tobacco: Former Systems developer    Types: [place] date: 2011  Vaping Use  . Vaping Use: Never used  Substance and Sexual Activity  . Alcohol use: No  . Drug use: No  . Sexual activity: Not Currently  Other Topics Concern  . Not on file  Social History Narrative  . Not on file   Social Determinants of Health   Financial Resource Strain: Not on file  Food Insecurity: Not on file  Transportation Needs: No Transportation Needs  . Lack of Transportation (Medical): No  . Lack of Transportation (Non-Medical): No  Physical Activity: Sufficiently Active  . Days of Exercise per Week: 6 days  . Minutes of Exercise per Session: 30 min  Stress: Not on file  Social Connections: Not on file  Intimate Partner Violence: Not on file    REVIEW OF SYSTEMS: Constitutional: Has had recent dizziness.  No profound fatigue. ENT:  No nose bleeds Pulm: No shortness of breath.  No cough. CV:  No palpitations, no LE edema.  Chest pain resolved. GU:  No  hematuria, no frequency GI: See HPI. Heme: At home no unusual or excessive bleeding or bruising.  Since coming into the hospital and being started on heparin, has developed purpura on his arms at sites of IVs and phlebotomy. Transfusions: None Neuro:  No headaches, no peripheral tingling or numbness Derm:  No itching, no rash or sores.  Endocrine:  No sweats or chills.  No polyuria or dysuria Immunization: Vaccine history reviewed.  He was vaccinated with [place] COVID-19 vax 2 in February 2021 Travel:  None beyond local counties in last few months.    PHYSICAL EXAM: Vital signs in last 24 hours: Vitals:   06/22/20 1400 06/22/20 1415  BP: 111/76 (!) 120/94  Pulse: 71 69  Resp:    Temp: 97.6 F (36.4 C)   SpO2:     Wt Readings from Last 3 Encounters:  06/22/20 63.2 kg  06/16/20 59.6 kg  04/10/20 55.1 kg    General: Pleasant, elderly, comfortable, somewhat frail but not ill-appearing.  He is hard of hearing Head: No facial asymmetry or swelling.  No signs of head trauma. Eyes: No conjunctival pallor.  No scleral icterus.  EOMI. Ears: Hard of hearing but if you speak loud and pull down your mask he can hear just fine. Nose: No congestion or discharge Mouth: Moist, pink, clear oral mucosa.  Tongue midline.  Good dentition with a few missing teeth. Neck: No JVD, no thyromegaly, no masses Lungs: Clear bilaterally no somewhat diminished breath sounds overall.  No labored breathing or cough. Heart: RRR.  Sinus rhythm in the 70s on telemetry monitor.  No MRG.  S1/S2 audible Abdomen: Soft, nontender, nondistended.  Active bowel sounds.  No HSM, masses, bruits, hernias.   Rectal: Deferred.  Stool Hemoccult cards x3 have been ordered. Musc/Skeltl: Slight spinal kyphosis and osteoporotic skeletal appearance. Extremities: No CCE Neurologic: Alert.  Appropriate.  Says the year is 2021.  Able to tell me his full name where he lives and identify the president. Skin: Purpura on both arms.   Clear TR (transradial) band at wrist.  No bleeding but some bruising at site Nodes:  No cervical adenopathy. Psych: Calm, cooperative, pleasant.  Speech is fluid.  Intake/Output from previous day: No intake/output data recorded. Intake/Output this shift: No intake/output data recorded.  LAB RESULTS: Recent Labs    06/21/20 1906 06/22/20 0513  WBC 10.1 13.2*  HGB 9.8* 8.2*  HCT 32.1* 27.0*  PLT 300 249   BMET Lab Results  Component Value Date   NA 136 06/22/2020   NA 135 06/21/2020   NA 138 06/16/2020   K 3.8 06/22/2020   K 3.0 (L) 06/21/2020   K 4.7 06/16/2020   CL 105 06/22/2020   CL 102 06/21/2020   CL 102 06/16/2020   CO2 23 06/22/2020   CO2 20 (L) 06/21/2020   CO2 21 06/16/2020   GLUCOSE 94 06/22/2020   GLUCOSE 204 (H) 06/21/2020   GLUCOSE 93 06/16/2020   BUN 18 06/22/2020   BUN 22 06/21/2020   BUN 15 06/16/2020   CREATININE 0.76 06/22/2020   CREATININE 0.88 06/21/2020   CREATININE 0.90 06/16/2020   CALCIUM 8.0 (L) 06/22/2020   CALCIUM 8.6 (L) 06/21/2020   CALCIUM 9.4 06/16/2020   LFT No results for input(s): PROT, ALBUMIN, AST, ALT, ALKPHOS, BILITOT, BILIDIR, IBILI in the last 72 hours. PT/INR No results found for: INR, PROTIME Hepatitis Panel No results for input(s): HEPBSAG, HCVAB, HEPAIGM, HEPBIGM in the last 72 hours. C-Diff No components found for: CDIFF Lipase  No results found for: LIPASE  Drugs of Abuse  No results found for: LABOPIA, COCAINSCRNUR, LABBENZ, AMPHETMU, THCU, LABBARB   RADIOLOGY STUDIES: DG Chest 2 View  Result Date: 06/21/2020 CLINICAL DATA:  Chest pain EXAM: CHEST - 2 VIEW COMPARISON:  July 14, 2017 FINDINGS: The heart size and mediastinal contours are within normal limits. Lungs are hyperexpanded both lungs are clear. The visualized skeletal structures are unremarkable. Aortic calcifications are noted. IMPRESSION: No active cardiopulmonary disease. Electronically Signed   By: Constance Holster M.D.   On: 06/21/2020 19:44    CARDIAC CATHETERIZATION  Result Date: 06/22/2020 Conclusions: 1. Severe single-vessel coronary artery disease with heavily calcified mid LAD stenosis of up to 90%. 2. Mild to moderate, ostial LMCA, proximal LAD, OM2, and proximal RCA disease. 3. Moderately to severely reduced left ventricular systolic function with mid/apical anterior and apical hypokinesis/akinesis. 4. Moderately elevated left ventricular filling pressure (LVEDP ~25 mmHg). Recommendations: 1. Images reviewed with Dr. Gwenlyn Found.  Given worsening anemia of uncertain etiology, recommend further workup of declining hemoglobin.  IV heparin will be restarted in 4 hours in the setting of ACS and PAF.  Defer adding P2Y12 inhibitor at this time. 2. Aggressive secondary prevention. 3. If no active bleeding is identified and hemoglobin stabilizes, recommend PCI/atherectomy to mid LAD. 4. Optimize evidence-based heart failure therapy. Nelva Bush, MD [facility]     IMPRESSION:   *    Normocytic anemia.  *    Severe single-vessel LAD disease.  Cardiology would like to pursue PCI but not until the anemia is evaluated. Echocardiogram performed at 2:45 PM today, report not yet available.  Per the cath note, patient has severely reduced left ventricular systolic function  *    Atrial fibrillation, resolved.  *    Hx prostate cancer, treated w radiation seed implants in 2005.    PLAN:     *    Collect stool specimens for FOBT testing as ordered.  *    Dr. Fuller Plan will follow. ? pursue EGD / colonoscopy  *    In a.m. obtain studies for iron parameters as  well as B12 and folate levels.   Azucena Freed  06/22/2020, 3:24 PM Phone [phone]     Attending Physician Note   I have taken a history, examined the patient and reviewed the chart. I agree with the Advanced Practitioner's note, impression and recommendations.  Worsening normocytic anemia without overt bleeding. PCI being considered with ASA, Plavix for 1 year. Obtain  FOBT, iron studies, B12, folate. Given acute NSTEMI, mod to severely reduced LV systolic function he is high risk for endoscopic procedures and anesthesia. Ideally would wait several weeks post MI. Will discuss further with Cardiology tomorrow.   Lucio Edward, MD FACG [phone]

## 2020-06-22 NOTE — H&P (Addendum)
Cardiology Admission History and Physical:   Patient ID: James Iha Sr. MRN: [id number]; DOB: [birth year]   Admission date: 06/21/2020  Primary Care Provider: [name], MD [facility] Cardiologist: No primary care provider on file.  [facility] Electrophysiologist:  None   Chief Complaint: chest pain/AF  Patient Profile:   [name] is an 61M with HLD, NSSVT, COPD, hypothyroidism, and HTN who presents with new onset chest pressure and AF/RVR.   History of Present Illness:   [name] is extremely hard of hearing and does not currently have his hearing aids with him so most of the story was obtained from his son. He lives with his wife and reports that this morning he woke up around 0700 and was sitting down when he had sudden onset chest pressure that was 10/10 severity. The pressure felt like someone was standing on his chest. No radiation and predominately L sided/center of chest. He has never experienced anything like this before. Per his son's report he has a very high pain tolerance and doesn't ever complain about anything. He did notice his heart racing as well as the severe chest pressure. He dealt with in on and off throughout the morning until eventually it was severe enough to prompt ED evaluation. His son said that [name] wife had initially told him to not worry about it and to stay at home until it became much more severe.   Past Medical History:  Diagnosis Date  . Acquired hypothyroidism 08/15/2019  . Anemia 02/05/2020   boarderline anemic  . Atopic dermatitis 12/05/2019  . Bacterial pneumonia, unspecified 06/14/2012   06/14/2012 CXR w/ RML PNA >Zithromax and Rocephin    . Bradycardia 11/28/2018  . Cancer [facility])    prostate cancer-seed implant  . Closed fracture dislocation of right elbow   . COPD (chronic obstructive pulmonary disease) ([place]) 01/21/2020  . Dermatitis 02/05/2020  . Dizziness 02/19/2016  . Essential hypertension 11/28/2018  . History of  prostate cancer    seed implant  . Hyperlipemia   . Hyperlipidemia, mixed 08/21/2008   Qualifier: Diagnosis of  By: Annamaria Boots MD, Clinton D   . LVH (left ventricular hypertrophy) 11/28/2018  . Malnutrition of moderate degree ([place]) 08/15/2019  . Mixed conductive and sensorineural hearing loss, 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  . Moderate persistent asthma, uncomplicated 11/25/9468  . Nonsustained ventricular tachycardia ([place]) 01/30/2019  . Other fatigue 02/05/2020  . Seasonal and perennial allergic rhinitis 05/17/2007      . Senile osteoporosis 08/15/2019  . SINUSITIS, ACUTE 06/28/2007   Qualifier: Diagnosis of  By: Royal Piedra NP, Tammy    . Ventricular ectopy 11/28/2018   Past Surgical History:  Procedure Laterality Date  . APPENDECTOMY    . ORIF ELBOW FRACTURE Right 04/10/2020   Procedure: OPEN REDUCTION INTERNAL FIXATION (ORIF) ELBOW/OLECRANON FRACTURE;  Surgeon: Marchia Bond, MD;  Location: [facility];  Service: Orthopedics;  Laterality: Right;    Medications Prior to Admission: Prior to Admission medications   Medication Sig Start Date End Date Taking? Authorizing Provider  acetaminophen (TYLENOL) 325 MG tablet Take 2 tablets (650 mg total) by mouth every 6 (six) hours as needed. 04/10/20   Merlene Pulling K, PA-C  albuterol (VENTOLIN HFA) 108 (90 Base) MCG/ACT inhaler INHALE 2 PUFFS INTO THE LUNGS EVERY 6 HOURS AS NEEDED FOR WHEEZING OR SHORTNESS OF BREATH 01/06/20   Baird Lyons D, MD  alendronate (FOSAMAX) 70 MG tablet Take 70 mg by mouth once a week. 04/06/20   [provider]  Fexofenadine HCl (ALLEGRA PO) Take daily as needed by mouth.    [provider]  ipratropium-albuterol (DUONEB) 0.5-2.5 (3) MG/3ML SOLN Take 3 mLs by nebulization every 4 (four) hours as needed.    [provider]  metoprolol succinate (TOPROL-XL) 25 MG 24 hr tablet Take 1 tablet (25 mg total) by mouth daily. Take with or immediately following a meal. 01/14/20 10/10/20  Park Liter, MD   Multiple Vitamins-Minerals (MULTIVITAMIN ADULT PO) Take 1 tablet by mouth daily.     [provider]  Nutritional Supplements (BOOST NUTRITIONAL ENERGY PO) Take by mouth. Drinking 1-2 per day    [provider]  SYNTHROID 150 MCG tablet TAKE 1 TABLET BY MOUTH DAILY BEFORE BREAKFAST. 04/09/20   [name], MD  triamcinolone (KENALOG) 0.1 % Apply 1 application topically 2 (two) times daily as needed. 06/08/20   [provider]    Allergies:    Allergies  Allergen Reactions  . Clarithromycin     REACTION: GI upset   Social History:   Social History   Socioeconomic History  . Marital status: Married    Spouse name: Not on file  . Number of children: Not on file  . Years of education: Not on file  . Highest education level: Not on file  Occupational History  . Occupation: Retired    Fish farm manager: OTHER    Comment: Scientist, research (life sciences)  Tobacco Use  . Smoking status: Former Smoker    Types: Cigarettes    Quit date: 1960    Years since quitting: 62.1  . Smokeless tobacco: Former Systems developer    Types: [place] date: 2011  Vaping Use  . Vaping Use: Never used  Substance and Sexual Activity  . Alcohol use: No  . Drug use: No  . Sexual activity: Not Currently  Other Topics Concern  . Not on file  Social History Narrative  . Not on file   Social Determinants of Health   Financial Resource Strain: Not on file  Food Insecurity: Not on file  Transportation Needs: No Transportation Needs  . Lack of Transportation (Medical): No  . Lack of Transportation (Non-Medical): No  Physical Activity: Sufficiently Active  . Days of Exercise per Week: 6 days  . Minutes of Exercise per Session: 30 min  Stress: Not on file  Social Connections: Not on file  Intimate Partner Violence: Not on file    Family History:   The patient's family history includes Alzheimer's disease in his father; Diabetes in his mother.    ROS:   Review of Systems: [y] = yes, [ ]   = no       General: Weight gain [ ] ; Weight loss [ ] ; Anorexia [ ] ; Fatigue [ ] ; Fever [ ] ; Chills [ ] ; Weakness [ ]     Cardiac: Chest pain/pressure [y]; Resting SOB [y]; Exertional SOB [ ] ; Orthopnea [ ] ; Pedal Edema [ ] ; Palpitations [ ] ; Syncope [ ] ; Presyncope [ ] ; Paroxysmal nocturnal dyspnea [ ]     Pulmonary: Cough [ ] ; Wheezing [ ] ; Hemoptysis [ ] ; Sputum [ ] ; Snoring [ ]     GI: Vomiting [ ] ; Dysphagia [ ] ; Melena [ ] ; Hematochezia [ ] ; Heartburn [ ] ; Abdominal pain [ ] ; Constipation [ ] ; Diarrhea [ ] ; BRBPR [ ]     GU: Hematuria [ ] ; Dysuria [ ] ; Nocturia [ ]   Vascular: Pain in legs with walking [ ] ; Pain in feet with lying flat [ ] ; Non-healing sores [ ] ;  Stroke [ ] ; TIA [ ] ; Slurred speech [ ] ;    Neuro: Headaches [ ] ; Vertigo [ ] ; Seizures [ ] ; Paresthesias [ ] ;Blurred vision [ ] ; Diplopia [ ] ; Vision changes [ ]     Ortho/Skin: Arthritis [ ] ; Joint pain [ ] ; Muscle pain [ ] ; Joint swelling [ ] ; Back Pain [ ] ; Rash [ ]     Psych: Depression [ ] ; Anxiety [ ]     Heme: Bleeding problems [ ] ; Clotting disorders [ ] ; Anemia [ ]     Endocrine: Diabetes [ ] ; Thyroid dysfunction [ ]    Physical Exam/Data:   Vitals:   06/22/20 0030 06/22/20 0100 06/22/20 0200 06/22/20 0300  BP: (!) 105/58 108/66 95/66 99/71   Pulse: (!) 48 86 (!) 51 83  Resp: (!) 23 (!) 31 19 (!) 24  Temp:      TempSrc:      SpO2: 97% 98% 99% 99%  Weight:      Height:       No intake or output data in the 24 hours ending 06/22/20 0413 Last 3 Weights 06/21/2020 06/16/2020 04/10/2020  Weight (lbs) 135 lb 131 lb 6.4 oz 121 lb 7.6 oz  Weight (kg) 61.236 kg 59.603 kg 55.1 kg     Body mass index is 18.31 kg/m.  General:  Well nourished, well developed, in no acute distress HEENT: normal Lymph: no adenopathy Neck: no JVD Endocrine:  No thryomegaly Vascular: No carotid bruits; FA pulses 2+ bilaterally without bruits  Cardiac:  normal S1, S2; RRR; no murmur  Lungs:  clear to auscultation bilaterally, no wheezing,  rhonchi or rales  Abd: soft, nontender, no hepatomegaly  Ext: no LE edema Musculoskeletal:  No deformities, BUE and BLE strength normal and equal Skin: warm and dry  Neuro:  CNs 2-12 intact, no focal abnormalities noted Psych:  Normal affect   EKG:  The ECG that was done demonstrated AF/RVR with lateral ST depressions. Follow up ECG he converted to NSR but still had mild anterolateral ST depressions in NSR  Relevant CV Studies:  TTE Result date: 01/17/19 1. The left ventricle has normal systolic function with an ejection  fraction of 60-65%. The cavity size was normal. There is mildly increased  left ventricular wall thickness. Left ventricular diastolic Doppler  parameters are consistent with  pseudonormalization.  2. The right ventricle has normal systolic function. The cavity was  normal. There is no increase in right ventricular wall thickness.  3. Left atrial size was mildly dilated.  4. Tricuspid valve regurgitation is mild-moderate.  5. The aortic valve has an indeterminate number of cusps. Mild thickening  of the aortic valve. Mild calcification of the aortic valve. Aortic valve  regurgitation was not assessed by color flow Doppler.  6. The aorta is normal unless otherwise noted.   Laboratory Data:  High Sensitivity Troponin:   Recent Labs  Lab 06/21/20 1906 06/21/20 2040  TROPONINIHS 111* 276*      Chemistry Recent Labs  Lab 06/16/20 1105 06/21/20 1906  NA 138 135  K 4.7 3.0*  CL 102 102  CO2 21 20*  GLUCOSE 93 204*  BUN 15 22  CREATININE 0.90 0.88  CALCIUM 9.4 8.6*  GFRNONAA 77 >60  GFRAA 89  --   ANIONGAP  --  13    Recent Labs  Lab 06/16/20 1105  PROT 6.4  ALBUMIN 4.1  AST 18  ALT 13  ALKPHOS 69  BILITOT 0.3   Hematology Recent Labs  Lab 06/16/20 1105 06/21/20 1906  WBC 8.5 10.1  RBC 3.79* 3.61*  HGB 10.3* 9.8*  HCT 32.7* 32.1*  MCV 86 88.9  MCH 27.2 27.1  MCHC 31.5 30.5  RDW 12.8 13.0  PLT 301 300   BNPNo results for  input(s): BNP, PROBNP in the last 168 hours.  DDimer No results for input(s): DDIMER in the last 168 hours.  Radiology/Studies:  DG Chest 2 View  Result Date: 06/21/2020 CLINICAL DATA:  Chest pain EXAM: CHEST - 2 VIEW COMPARISON:  July 14, 2017 FINDINGS: The heart size and mediastinal contours are within normal limits. Lungs are hyperexpanded both lungs are clear. The visualized skeletal structures are unremarkable. Aortic calcifications are noted. IMPRESSION: No active cardiopulmonary disease. Electronically Signed   By: Constance Holster M.D.   On: 06/21/2020 19:44   TIMI Risk Score for Unstable Angina or Non-ST Elevation MI:   The patient's TIMI risk score is 4, which indicates a 20% risk of all cause mortality, new or recurrent myocardial infarction or need for urgent revascularization in the next 14 days.{  Assessment and Plan:   Mr. Gomez is an 40M with HLD, NSVT, COPD, hypothyroidism, and HTN who presents with new onset chest pressure and AF/RVR. He has a distant past history of tobacco use but otherwise his RFs for CAD include HTN, HLD and age. He is currently completely asymptomatic following tx with diltiazem 10 mg IV and rate control of his AF which by report was HR 110-170. He had a significant troponin rise which is hard to know whether this was primary ACS vs demand ischemia in the setting of AF/RVR. The severity of the chest pressure is concerning. C2V (3) so OAC would be indicated, mainly 2/2 age. There was no obvious precipitating factor to either his chest pressure or AF. He has had no signs of AF or chest pressure before. The ST depressions in his lateral leads were much more pronounced when in AF/RVR although they haven't completely resolved when back in NSR. Several studies over the past few years have provided somewhat contradictory results regarding the relationship between AF/NSTEMI. Which came first for [name] is currently unclear although his main sx was the chest pressure  which he felt started around 10 AM prior to experiencing palpitations after lunch which were relatively minor. Given that his main symptom was severe chest pressure which seemed to precede the palpitations makes me more concerned for a type I event as opposed to demand ischemia. Will plan to get echo early this morning to assess for WMA. Likely will need coronary evaluation given story, at minimum echo+cCTA but I would favor coronary angio today. Patient and son are agreeable to any evaluation necessary. Currently on ASA/heparin. Will transition to eliquis after cardiac eval for NSTEMI is complete.   Severity of Illness: The appropriate patient status for this patient is INPATIENT. Inpatient status is judged to be reasonable and necessary in order to provide the required intensity of service to ensure the patient's safety. The patient's presenting symptoms, physical exam findings, and initial radiographic and laboratory data in the context of their chronic comorbidities is felt to place them at high risk for further clinical deterioration. Furthermore, it is not anticipated that the patient will be medically stable for discharge from the hospital within 2 midnights of admission. The following factors support the patient status of inpatient.   " The patient's presenting symptoms include chest pressure. " The worrisome physical exam findings include chest pressure. " The initial radiographic and laboratory data are worrisome  because of ST depressions on ECG. " The chronic co-morbidities include HTN, HLD.  * I certify that at the point of admission it is my clinical judgment that the patient will require inpatient hospital care spanning beyond 2 midnights from the point of admission due to high intensity of service, high risk for further deterioration and high frequency of surveillance required.*   For questions or updates, please contact [place] Please consult [email or web address] for contact info under    Signed, Dion Body, MD  06/22/2020 4:13 AM

## 2020-06-22 NOTE — Progress Notes (Signed)
[facility] for heparin Indication: atrial fibrillation  Allergies  Allergen Reactions  . Clarithromycin     REACTION: GI upset    Patient Measurements: Height: 6' (182.9 cm) Weight: 61.2 kg (135 lb) IBW/kg (Calculated) : 77.6 Heparin Dosing Weight: 61.2 kg   Vital Signs: Temp: 98 F (36.7 C) (02/14 0656) Temp Source: Oral (02/14 0656) BP: 118/72 (02/14 3825) Pulse Rate: 25 (02/14 0638)  Labs: Recent Labs    06/21/20 1906 06/21/20 2040 06/22/20 0513 06/22/20 0648  HGB 9.8*  --  8.2*  --   HCT 32.1*  --  27.0*  --   PLT 300  --  249  --   HEPARINUNFRC  --   --   --  0.31  CREATININE 0.88  --  0.76  --   TROPONINIHS 111* 276* 3,745*  --     Estimated Creatinine Clearance: 57.4 mL/min (by C-G formula based on SCr of 0.76 mg/dL).  Assessment: 87 y.o. male with chest pain and Afib for heparin  Goal of Therapy:  Heparin level 0.3-0.7 units/ml Monitor platelets by anticoagulation protocol: Yes   Plan:  Continue Heparin at current rate  Phillis Knack, PharmD, BCPS  06/22/2020 7:23 AM  Please check AMION for all [place] phone numbers After 10:00 PM, call [place] [phone]

## 2020-06-22 NOTE — Interval H&P Note (Signed)
History and Physical Interval Note:  06/22/2020 12:50 PM  James Iha Sr.  has presented today for surgery, with the diagnosis of NSTEMI.  The various methods of treatment have been discussed with the patient and family. After consideration of risks, benefits and other options for treatment, the patient has consented to  Procedure(s): LEFT HEART CATH AND CORONARY ANGIOGRAPHY (N/A) as a surgical intervention.  The patient's history has been reviewed, patient examined, no change in status, stable for surgery.  I have reviewed the patient's chart and labs.  Questions were answered to the patient's satisfaction.    Cath Lab Visit (complete for each Cath Lab visit)  Clinical Evaluation Leading to the Procedure:   ACS: Yes.    Non-ACS:  N/A  [name]

## 2020-06-22 NOTE — ED Notes (Signed)
Pt placed on hospital bed

## 2020-06-22 NOTE — ED Notes (Signed)
Cardiology made aware of troponin levels.

## 2020-06-22 NOTE — ED Notes (Signed)
MD at bedside. 

## 2020-06-23 ENCOUNTER — Inpatient Hospital Stay (HOSPITAL_COMMUNITY): Payer: Medicare HMO

## 2020-06-23 DIAGNOSIS — D509 Iron deficiency anemia, unspecified: Secondary | ICD-10-CM

## 2020-06-23 DIAGNOSIS — I214 Non-ST elevation (NSTEMI) myocardial infarction: Secondary | ICD-10-CM | POA: Diagnosis not present

## 2020-06-23 DIAGNOSIS — I480 Paroxysmal atrial fibrillation: Secondary | ICD-10-CM

## 2020-06-23 DIAGNOSIS — D519 Vitamin B12 deficiency anemia, unspecified: Secondary | ICD-10-CM

## 2020-06-23 DIAGNOSIS — I255 Ischemic cardiomyopathy: Secondary | ICD-10-CM

## 2020-06-23 LAB — BASIC METABOLIC PANEL
Anion gap: 8 (ref 5–15)
BUN: 13 mg/dL (ref 8–23)
CO2: 22 mmol/L (ref 22–32)
Calcium: 8.5 mg/dL — ABNORMAL LOW (ref 8.9–10.3)
Chloride: 104 mmol/L (ref 98–111)
Creatinine, Ser: 0.75 mg/dL (ref 0.61–1.24)
GFR, Estimated: >60 mL/min (ref >60)
Glucose, Bld: 97 mg/dL (ref 70–99)
Potassium: 3.7 mmol/L (ref 3.5–5.1)
Sodium: 134 mmol/L — ABNORMAL LOW (ref 135–145)

## 2020-06-23 LAB — CBC
HCT: 31.5 % — ABNORMAL LOW (ref 39.0–52.0)
Hemoglobin: 10.1 g/dL — ABNORMAL LOW (ref 13.0–17.0)
MCH: 27.9 pg (ref 26.0–34.0)
MCHC: 32.1 g/dL (ref 30.0–36.0)
MCV: 87 fL (ref 80.0–100.0)
Platelets: 351 10*3/uL (ref 150–400)
RBC: 3.62 MIL/uL — ABNORMAL LOW (ref 4.22–5.81)
RDW: 13.3 % (ref 11.5–15.5)
WBC: 23.5 10*3/uL — ABNORMAL HIGH (ref 4.0–10.5)
nRBC: 0 % (ref 0.0–0.2)

## 2020-06-23 LAB — IRON AND TIBC
Iron: 17 ug/dL — ABNORMAL LOW (ref 45–182)
Saturation Ratios: 4 % — ABNORMAL LOW (ref 17.9–39.5)
TIBC: 407 ug/dL (ref 250–450)
UIBC: 390 ug/dL

## 2020-06-23 LAB — FOLATE: Folate: 29.8 ng/mL (ref >5.9)

## 2020-06-23 LAB — ECHOCARDIOGRAM COMPLETE
Height: 72 in
P 1/2 time: 405 msec
S' Lateral: 3.5 cm
Weight: 2137.58 oz

## 2020-06-23 LAB — RETICULOCYTES
Immature Retic Fract: 27.7 % — ABNORMAL HIGH (ref 2.3–15.9)
RBC.: 3.59 MIL/uL — ABNORMAL LOW (ref 4.22–5.81)
Retic Count, Absolute: 61 10*3/uL (ref 19.0–186.0)
Retic Ct Pct: 1.7 % (ref 0.4–3.1)

## 2020-06-23 LAB — OCCULT BLOOD X 1 CARD TO LAB, STOOL: Fecal Occult Bld: NEGATIVE

## 2020-06-23 LAB — FERRITIN: Ferritin: 22 ng/mL — ABNORMAL LOW (ref 24–336)

## 2020-06-23 LAB — HEPARIN LEVEL (UNFRACTIONATED)
Heparin Unfractionated: 0.19 IU/mL — ABNORMAL LOW (ref 0.30–0.70)
Heparin Unfractionated: 0.22 IU/mL — ABNORMAL LOW (ref 0.30–0.70)
Heparin Unfractionated: 0.21 IU/mL — ABNORMAL LOW (ref 0.30–0.70)

## 2020-06-23 LAB — VITAMIN B12: Vitamin B-12: <50 pg/mL — ABNORMAL LOW (ref 180–914)

## 2020-06-23 MED ORDER — CLOPIDOGREL BISULFATE 75 MG PO TABS
75.0000 mg | ORAL_TABLET | Freq: Every day | ORAL | Status: DC
Start: 2020-06-24 — End: 2020-06-25
  Administered 2020-06-24 – 2020-06-25 (×2): 75 mg via ORAL
  Filled 2020-06-23 (×2): qty 1

## 2020-06-23 MED ORDER — ASPIRIN 81 MG PO CHEW
81.0000 mg | CHEWABLE_TABLET | ORAL | Status: AC
  Administered 2020-06-24: 81 mg via ORAL
  Filled 2020-06-23: qty 1

## 2020-06-23 MED ORDER — MELATONIN 3 MG PO TABS
3.0000 mg | ORAL_TABLET | Freq: Every day | ORAL | Status: DC
  Administered 2020-06-23 – 2020-06-24 (×3): 3 mg via ORAL
  Filled 2020-06-23 (×3): qty 1

## 2020-06-23 MED ORDER — METOPROLOL TARTRATE 12.5 MG HALF TABLET
12.5000 mg | ORAL_TABLET | Freq: Two times a day (BID) | ORAL | Status: DC
  Administered 2020-06-23 – 2020-06-24 (×3): 12.5 mg via ORAL
  Filled 2020-06-23 (×3): qty 1

## 2020-06-23 MED ORDER — PANTOPRAZOLE SODIUM 40 MG PO TBEC
40.0000 mg | DELAYED_RELEASE_TABLET | Freq: Every day | ORAL | Status: DC
  Administered 2020-06-23 – 2020-06-25 (×3): 40 mg via ORAL
  Filled 2020-06-23 (×3): qty 1

## 2020-06-23 MED ORDER — SODIUM CHLORIDE 0.9 % WEIGHT BASED INFUSION
1.0000 mL/kg/h | INTRAVENOUS | Status: DC
  Administered 2020-06-24: 1 mL/kg/h via INTRAVENOUS

## 2020-06-23 MED ORDER — SODIUM CHLORIDE 0.9 % IV SOLN
510.0000 mg | INTRAVENOUS | Status: DC
  Administered 2020-06-23: 510 mg via INTRAVENOUS
  Filled 2020-06-23: qty 17

## 2020-06-23 MED ORDER — ALBUTEROL SULFATE HFA 108 (90 BASE) MCG/ACT IN AERS
2.0000 | INHALATION_SPRAY | RESPIRATORY_TRACT | Status: DC | PRN
  Filled 2020-06-23: qty 6.7

## 2020-06-23 MED ORDER — ASPIRIN 81 MG PO CHEW
81.0000 mg | CHEWABLE_TABLET | Freq: Every day | ORAL | Status: DC
  Administered 2020-06-25: 81 mg via ORAL
  Filled 2020-06-23: qty 1

## 2020-06-23 MED ORDER — SODIUM CHLORIDE 0.9% FLUSH
3.0000 mL | Freq: Two times a day (BID) | INTRAVENOUS | Status: DC
  Administered 2020-06-23 (×2): 3 mL via INTRAVENOUS

## 2020-06-23 MED ORDER — SODIUM CHLORIDE 0.9 % IV SOLN: 250.0000 mL | INTRAVENOUS | Status: DC | PRN

## 2020-06-23 MED ORDER — SODIUM CHLORIDE 0.9% FLUSH: 3.0000 mL | INTRAVENOUS | Status: DC | PRN

## 2020-06-23 MED ORDER — CLOPIDOGREL BISULFATE 75 MG PO TABS
300.0000 mg | ORAL_TABLET | Freq: Every day | ORAL | Status: AC
  Administered 2020-06-23: 300 mg via ORAL
  Filled 2020-06-23: qty 4

## 2020-06-23 MED ORDER — HALOPERIDOL LACTATE 5 MG/ML IJ SOLN
1.0000 mg | Freq: Once | INTRAMUSCULAR | Status: AC
  Administered 2020-06-23: 1 mg via INTRAVENOUS
  Filled 2020-06-23: qty 1

## 2020-06-23 MED ORDER — SODIUM CHLORIDE 0.9 % WEIGHT BASED INFUSION
3.0000 mL/kg/h | INTRAVENOUS | Status: DC
  Administered 2020-06-24: 3 mL/kg/h via INTRAVENOUS

## 2020-06-23 NOTE — Progress Notes (Signed)
[facility] for heparin Indication: chest pain/ACS and atrial fibrillation  Allergies  Allergen Reactions  . Clarithromycin     REACTION: GI upset    Patient Measurements: Height: 6' (182.9 cm) Weight: 60.6 kg (133 lb 9.6 oz) IBW/kg (Calculated) : 77.6 Heparin Dosing Weight: 61.2 kg   Vital Signs: Temp: 97.5 F (36.4 C) (02/15 0800) Temp Source: Oral (02/15 0800) BP: 141/73 (02/15 0800) Pulse Rate: 85 (02/15 1139)  Labs: Recent Labs    06/21/20 1906 06/21/20 2040 06/22/20 0513 06/22/20 0648 06/23/20 0223 06/23/20 1148  HGB 9.8*  --  8.2*  --  10.1*  --   HCT 32.1*  --  27.0*  --  31.5*  --   PLT 300  --  249  --  351  --   HEPARINUNFRC  --   --   --  0.31 0.19* 0.22*  CREATININE 0.88  --  0.76  --  0.75  --   TROPONINIHS 111* 276* 3,745*  --   --   --     Estimated Creatinine Clearance: 56.8 mL/min (by C-G formula based on SCr of 0.75 mg/dL).  Assessment: 87 y.o. male with chest pain and Afib for heparin.   S/p cath 2/14, found severe single-vessel coronary artery disease with heavily calcified mid LAD stenosis of up to 90%. Pending further workup of declining hemoglobin before considering further interventions.   Heparin level this afternoon is subtherapeutic (0.22) after rate increase overnight to 950 units/hr. Plan is for cardiac cath/PCI tomorrow. Still having intermittent episodes of afib. Hgb improved to 10.1 today, plt wnl. No bleeding. GI following for anemia workup.   Goal of Therapy:  Heparin level 0.3-0.7 units/ml Monitor platelets by anticoagulation protocol: Yes   Plan:  Increase IV heparin to 1100 units/hr 8h heparin level Daily heparin level, CBC Monitor s/s bleeding  Rebbeca Paul, PharmD PGY1 Pharmacy Resident 06/23/2020 1:39 PM  Please check [email or web address] for unit-specific pharmacy phone numbers.

## 2020-06-23 NOTE — Progress Notes (Signed)
[facility] for heparin Indication: chest pain/ACS and atrial fibrillation  Allergies  Allergen Reactions  . Clarithromycin     REACTION: GI upset    Patient Measurements: Height: 6' (182.9 cm) Weight: 60.6 kg (133 lb 9.6 oz) IBW/kg (Calculated) : 77.6 Heparin Dosing Weight: 61.2 kg   Vital Signs: Temp: 98.3 F (36.8 C) (02/15 2216) Temp Source: Oral (02/15 2216) BP: 125/89 (02/15 2216) Pulse Rate: 81 (02/15 2216)  Labs: Recent Labs    06/21/20 1906 06/21/20 2040 06/22/20 0513 06/22/20 0648 06/23/20 0223 06/23/20 1148 06/23/20 2244  HGB 9.8*  --  8.2*  --  10.1*  --   --   HCT 32.1*  --  27.0*  --  31.5*  --   --   PLT 300  --  249  --  351  --   --   HEPARINUNFRC  --   --   --    < > 0.19* 0.22* 0.21*  CREATININE 0.88  --  0.76  --  0.75  --   --   TROPONINIHS 111* 276* 3,745*  --   --   --   --    < > = values in this interval not displayed.    Estimated Creatinine Clearance: 56.8 mL/min (by C-G formula based on SCr of 0.75 mg/dL).  Assessment: 87 y.o. male with chest pain and Afib, s/p cath and awaiting PCI, for heparin.  Goal of Therapy:  Heparin level 0.3-0.7 units/ml Monitor platelets by anticoagulation protocol: Yes   Plan:  Increase Heparin 1250 units/hr Follow-up am labs.   Phillis Knack, PharmD, BCPS

## 2020-06-23 NOTE — Progress Notes (Addendum)
Progress Note  Patient Name: James Burbano Sr. Date of Encounter: 06/23/2020  [facility] Cardiologist: No primary care provider on file.   Subjective   Had a rough night, son at the bedside this morning  Inpatient Medications    Scheduled Meds: . aspirin  81 mg Oral Daily  . atorvastatin  80 mg Oral Daily  . levothyroxine  150 mcg Oral Q0600  . melatonin  3 mg Oral QHS  . metoprolol succinate  25 mg Oral Daily  . pantoprazole  40 mg Oral Q0600  . sodium chloride flush  3 mL Intravenous Q12H   Continuous Infusions: . sodium chloride    . ferumoxytol    . heparin 950 Units/hr (06/23/20 0421)   PRN Meds: sodium chloride, acetaminophen, albuterol, ipratropium-albuterol, sodium chloride flush   Vital Signs    Vitals:   06/22/20 2321 06/23/20 0255 06/23/20 0300 06/23/20 0800  BP: 135/83 121/73  (!) 141/73  Pulse: 85 83  (!) 49  Resp: 18 16  (!) 22  Temp: 98.3 F (36.8 C) 98.6 F (37 C)  (!) 97.5 F (36.4 C)  TempSrc: Oral Oral  Oral  SpO2: 95% 96%  97%  Weight:   60.6 kg   Height:        Intake/Output Summary (Last 24 hours) at 06/23/2020 1027 Last data filed at 06/23/2020 0930 Gross per 24 hour  Intake 566.27 ml  Output 800 ml  Net -233.73 ml   Last 3 Weights 06/23/2020 06/22/2020 06/21/2020  Weight (lbs) 133 lb 9.6 oz 139 lb 5.3 oz 135 lb  Weight (kg) 60.6 kg 63.2 kg 61.236 kg      Telemetry    SR-->intermittent episodes of afib, PACs - Personally Reviewed  ECG    No new tracing this morning  Physical Exam   GEN: No acute distress.   Neck: No JVD Cardiac: RRR, no murmurs, rubs, or gallops.  Respiratory: Clear to auscultation bilaterally. GI: Soft, nontender, non-distended  MS: No edema; No deformity. Neuro:  Nonfocal  Psych: Normal affect   Labs    High Sensitivity Troponin:   Recent Labs  Lab 06/21/20 1906 06/21/20 2040 06/22/20 0513  TROPONINIHS 111* 276* 3,745*      Chemistry Recent Labs  Lab 06/16/20 1105 06/21/20 1906  06/22/20 0513 06/23/20 0223  NA 138 135 136 134*  K 4.7 3.0* 3.8 3.7  CL 102 102 105 104  CO2 21 20* 23 22  GLUCOSE 93 204* 94 97  BUN 15 22 18 13   CREATININE 0.90 0.88 0.76 0.75  CALCIUM 9.4 8.6* 8.0* 8.5*  PROT 6.4  --   --   --   ALBUMIN 4.1  --   --   --   AST 18  --   --   --   ALT 13  --   --   --   ALKPHOS 69  --   --   --   BILITOT 0.3  --   --   --   GFRNONAA 77 >60 >60 >60  GFRAA 89  --   --   --   ANIONGAP  --  13 8 8      Hematology Recent Labs  Lab 06/21/20 1906 06/22/20 0513 06/23/20 0223  WBC 10.1 13.2* 23.5*  RBC 3.61* 3.06* 3.62*  3.59*  HGB 9.8* 8.2* 10.1*  HCT 32.1* 27.0* 31.5*  MCV 88.9 88.2 87.0  MCH 27.1 26.8 27.9  MCHC 30.5 30.4 32.1  RDW 13.0 13.2 13.3  PLT 300 249 351    BNPNo results for input(s): BNP, PROBNP in the last 168 hours.   DDimer No results for input(s): DDIMER in the last 168 hours.   Radiology    DG Chest 2 View  Result Date: 06/21/2020 CLINICAL DATA:  Chest pain EXAM: CHEST - 2 VIEW COMPARISON:  July 14, 2017 FINDINGS: The heart size and mediastinal contours are within normal limits. Lungs are hyperexpanded both lungs are clear. The visualized skeletal structures are unremarkable. Aortic calcifications are noted. IMPRESSION: No active cardiopulmonary disease. Electronically Signed   By: Constance Holster M.D.   On: 06/21/2020 19:44   CARDIAC CATHETERIZATION  Result Date: 06/22/2020 Conclusions: 1. Severe single-vessel coronary artery disease with heavily calcified mid LAD stenosis of up to 90%. 2. Mild to moderate, ostial LMCA, proximal LAD, OM2, and proximal RCA disease. 3. Moderately to severely reduced left ventricular systolic function with mid/apical anterior and apical hypokinesis/akinesis. 4. Moderately elevated left ventricular filling pressure (LVEDP ~25 mmHg). Recommendations: 1. Images reviewed with Dr. Gwenlyn Found.  Given worsening anemia of uncertain etiology, recommend further workup of declining hemoglobin.  IV  heparin will be restarted in 4 hours in the setting of ACS and PAF.  Defer adding P2Y12 inhibitor at this time. 2. Aggressive secondary prevention. 3. If no active bleeding is identified and hemoglobin stabilizes, recommend PCI/atherectomy to mid LAD. 4. Optimize evidence-based heart failure therapy. Nelva Bush, MD [facility]    Cardiac Studies   Cath: 06/22/20  Conclusions: 1. Severe single-vessel coronary artery disease with heavily calcified mid LAD stenosis of up to 90%. 2. Mild to moderate, ostial LMCA, proximal LAD, OM2, and proximal RCA disease. 3. Moderately to severely reduced left ventricular systolic function with mid/apical anterior and apical hypokinesis/akinesis. 4. Moderately elevated left ventricular filling pressure (LVEDP ~25 mmHg).  Recommendations: 1. Images reviewed with Dr. Gwenlyn Found.  Given worsening anemia of uncertain etiology, recommend further workup of declining hemoglobin.  IV heparin will be restarted in 4 hours in the setting of ACS and PAF.  Defer adding P2Y12 inhibitor at this time. 2. Aggressive secondary prevention. 3. If no active bleeding is identified and hemoglobin stabilizes, recommend PCI/atherectomy to mid LAD. 4. Optimize evidence-based heart failure therapy.  Nelva Bush, MD  Diagnostic Dominance: Right     Patient Profile     87 y.o. male  with PMH of NSVT, COPD, Hypothyroidism, and HTN who presented with chest pain and new onset Afib/NSTEMI.  Assessment & Plan    1. NSTEMI: Reports developing chest pain yesterday that was intermittent throughout the day with associated shortness of breath. hsTn rose to 3745. EKG on admission showed afib RVR with ST depression in lateral leads which persisted once he had converted to SR. Underwent cardiac cath noted above with Dr. Saunders Revel showing heavily calcified mLAD lesion, along with 60% in mRCA. He was not stented given his anemia. Seen by GI but considered high risk for procedure. This was  discussed with the patient and son who understand he will need PCI and close monitoring of his CBC -- will plan for cardiac cath tomorrow with orbital arthrectomy  -- load with plavix 300mg  x1 now -- on ASA, statin, and BB  2. New onset afib RVR: denies any specific palpitations prior to admission, suspect his shortness of breath was related to Afib.  -- given IV diltiazem IVP and converted to SR. Having intermittent episodes of afib with sinus beats as well -- currently on IV heparin -- CHA2DS2-VASc Score of at least  3, suspect will need [place] pending clinic work up -- will switch to metoprolol 12.5mg  BID  3. Anemia: Hgb 9.8>>8.2>>10.1 this morning. Unclear why increased this morning. Possible that 8.2 was not correct. Reports having blood done recently through PCP office and asked to check stool cards.  -- denies any obvious bleeding or dark tarry stools prior to admission -- seen by GI, but high risk for procedure with cardiac disease -- iron def, IV feraheme ordered  4. Hypokalemia: K+ 3.7 this morning -- daily BMET  5. HTN: controlled on current regimen -- switching to metoprolol 12.5mg  BID  6. ICM: EF reported at 35% on LV gram. No signs of volume overload on exam -- tolerating low dose BB, will hold on adding ACE/ARB given need for cath tomorrow  7. Leukocytosis: WBC elevated at 23.5 this morning. No fevers overnight or s/s of infection. Could be inflammatory in response to MI?  -- will follow for now  For questions or updates, please contact [place] Please consult [email or web address] for contact info under        Signed, Reino Bellis, NP  06/23/2020, 10:27 AM    Agree with note by Reino Bellis NP-C  Mr. Arlington Calix was admitted with non-STEMI.  His troponins were in the 3000 range.  He was in A. fib.  His cardiac cath revealed a high-grade calcified mid LAD lesion with an anteroapical wall motion normality Karlo Goeden's EF is 35%.  (As outlined.  His white count is elevated  at 35,000 potentially related to his non-STEMI.  He remains in and had A. fib with PVCs on IV heparin.  There was a question of GI bleeding but his hemoglobin is, to 10.1 without transfusion.  He is pain-free.  His exam is benign.  He has been evaluated by GI as well.  At this point, I think it is prudent to proceed with orbital atherectomy, PCI drug-eluting stenting of his mid LAD.  He will most likely need to be on low-dose aspirin, Plavix and 8 DOAC for 3 days after which aspirin can be discontinued.  We will carefully monitor his hemoglobin.  Lorretta Harp, M.D., [place], [facility], [place] [address] [place], [place]  [address]  [phone] 06/23/2020 10:56 AM

## 2020-06-23 NOTE — Progress Notes (Signed)
Pt is confuse, needs to redirect often, actively getting out of the bed, pulls tele leads, and disoriented. Paged Cardiology, Haldol was ordered and given.  After two hours, pt still agitated, additional haldol was ordered.  Will monitor.

## 2020-06-23 NOTE — Plan of Care (Signed)
  Problem: Education: Goal: Knowledge of General Education information will improve Description Including pain rating scale, medication(s)/side effects and non-pharmacologic comfort measures Outcome: Progressing   

## 2020-06-23 NOTE — Progress Notes (Signed)
  Echocardiogram 2D Echocardiogram has been performed.  [name] 06/23/2020, 2:37 PM

## 2020-06-23 NOTE — Progress Notes (Addendum)
Daily Rounding Note  06/23/2020, 9:27 AM  LOS: 2 days   SUBJECTIVE:   Chief complaint: Anemia with iron and B12 deficiency.  Undetermined FOBT status  Confusion, agitation overnight, treated with Haldol.  Presently calm.  No BM's to send for FOBT.   At present Dr Gwenlyn Found is planning cardiac cath and stenting tmrw and DAPT after that.    Hgb improved.   No complaints Intermittent A fib.    OBJECTIVE:         Vital signs in last 24 hours:    Temp:  [97.5 F (36.4 C)-98.6 F (37 C)] 97.5 F (36.4 C) (02/15 0800) Pulse Rate:  [0-230] 49 (02/15 0800) Resp:  [0-39] 22 (02/15 0800) BP: (111-141)/(69-96) 141/73 (02/15 0800) SpO2:  [0 %-100 %] 97 % (02/15 0800) Weight:  [60.6 kg-63.2 kg] 60.6 kg (02/15 0300)   Filed Weights   06/21/20 2044 06/22/20 0952 06/23/20 0300  Weight: 61.2 kg 63.2 kg 60.6 kg   General: looks chronically unwell.  Alert.  HOH.   Heart: RRR. Chest: Clear bilaterally.  No labored breathing or cough Abdomen: Soft without tenderness or distention.  Active bowel sounds Extremities: No CCE. Neuro/Psych: Hard of hearing.  Following commands.  Calm.  Moves all 4 limbs.  No tremors.  Intake/Output from previous day: 02/14 0701 - 02/15 0700 In: 326.3 [P.O.:240; I.V.:86.3] Out: 500 [Urine:500]  Intake/Output this shift: Total I/O In: 240 [P.O.:240] Out: 100 [Urine:100]  Lab Results: Recent Labs    06/21/20 1906 06/22/20 0513 06/23/20 0223  WBC 10.1 13.2* 23.5*  HGB 9.8* 8.2* 10.1*  HCT 32.1* 27.0* 31.5*  PLT 300 249 351   BMET Recent Labs    06/21/20 1906 06/22/20 0513 06/23/20 0223  NA 135 136 134*  K 3.0* 3.8 3.7  CL 102 105 104  CO2 20* 23 22  GLUCOSE 204* 94 97  BUN 22 18 13   CREATININE 0.88 0.76 0.75  CALCIUM 8.6* 8.0* 8.5*   LFT No results for input(s): PROT, ALBUMIN, AST, ALT, ALKPHOS, BILITOT, BILIDIR, IBILI in the last 72 hours. PT/INR No results for input(s): LABPROT,  INR in the last 72 hours. Hepatitis Panel No results for input(s): HEPBSAG, HCVAB, HEPAIGM, HEPBIGM in the last 72 hours.  Studies/Results: DG Chest 2 View  Result Date: 06/21/2020 CLINICAL DATA:  Chest pain EXAM: CHEST - 2 VIEW COMPARISON:  July 14, 2017 FINDINGS: The heart size and mediastinal contours are within normal limits. Lungs are hyperexpanded both lungs are clear. The visualized skeletal structures are unremarkable. Aortic calcifications are noted. IMPRESSION: No active cardiopulmonary disease. Electronically Signed   By: Constance Holster M.D.   On: 06/21/2020 19:44   CARDIAC CATHETERIZATION  Result Date: 06/22/2020 Conclusions: 1. Severe single-vessel coronary artery disease with heavily calcified mid LAD stenosis of up to 90%. 2. Mild to moderate, ostial LMCA, proximal LAD, OM2, and proximal RCA disease. 3. Moderately to severely reduced left ventricular systolic function with mid/apical anterior and apical hypokinesis/akinesis. 4. Moderately elevated left ventricular filling pressure (LVEDP ~25 mmHg). Recommendations: 1. Images reviewed with Dr. Gwenlyn Found.  Given worsening anemia of uncertain etiology, recommend further workup of declining hemoglobin.  IV heparin will be restarted in 4 hours in the setting of ACS and PAF.  Defer adding P2Y12 inhibitor at this time. 2. Aggressive secondary prevention. 3. If no active bleeding is identified and hemoglobin stabilizes, recommend PCI/atherectomy to mid LAD. 4. Optimize evidence-based heart failure therapy. Nelva Bush, MD [facility]  [facility]   Scheduled Meds: . aspirin  81 mg Oral Daily  . atorvastatin  80 mg Oral Daily  . levothyroxine  150 mcg Oral Q0600  . melatonin  3 mg Oral QHS  . metoprolol succinate  25 mg Oral Daily  . sodium chloride flush  3 mL Intravenous Q12H   Continuous Infusions: . sodium chloride    . heparin 950 Units/hr (06/23/20 0421)   PRN Meds:.sodium chloride, acetaminophen, albuterol, ipratropium-albuterol,  sodium chloride flush   ASSESMENT:   *   [place] anemia. Low iron 17, low iron sats, low ferritin 22, low B12 at less than 50.  Normal folate. No FOBT tests to date. Hgb 8.2 >> 10.1.  No PRBCs transfused to date.  Son reports that the patient and his wife eat mostly fast food or prepared frozen foods such as hot pockets.  Get very little in the way of vegetables.  *   NSTEMI.  Single-vessel LAD disease.  PCI intervention on hold due to anemia. EF 35% at cath.   New A. fib, RVR.  Now intermittent, rate controlled A. fib.  Cardiology wanting to start Eliquis. Remains on heparin gtt.  *   Leukocytosis.    PLAN   *    Hold off on EGD, colonoscopy.  Cards proceeding to cath/PCI tmrw and DAPT/eliquis after that.    *   Start empiric Protonix 40/day.  Feraheme infusion x 2, defer management of B12 deficiency to attending MD. Do not start oral iron as it will impede colonoscopy prep should we choose to pursue colonoscopy and it turns stools black in color which is confusing for staff in terms of GI bleeding.  *   If and when any procedures are planned would need consent from either his Daughter Paola, [place] at [phone] or Son Kermitt Harjo, Junior [phone]   Azucena Freed  06/23/2020, 9:27 AM Phone [phone]     Attending Physician Note   I have taken an interval history, reviewed the chart and examined the patient. I agree with the Advanced Practitioner's note, impression and recommendations.   Iron deficiency and B12 deficiency anemia. Supplement both. FOBT is pending.  For PCI tomorrow.   Lucio Edward, MD FACG [phone]

## 2020-06-23 NOTE — Progress Notes (Signed)
[place] for heparin Indication: chest pain/ACS and atrial fibrillation  Allergies  Allergen Reactions  . Clarithromycin     REACTION: GI upset    Patient Measurements: Height: 6' (182.9 cm) Weight: 60.6 kg (133 lb 9.6 oz) IBW/kg (Calculated) : 77.6 Heparin Dosing Weight: 61.2 kg   Vital Signs: Temp: 98.6 F (37 C) (02/15 0255) Temp Source: Oral (02/15 0255) BP: 121/73 (02/15 0255) Pulse Rate: 83 (02/15 0255)  Labs: Recent Labs    06/21/20 1906 06/21/20 2040 06/22/20 0513 06/22/20 0648 06/23/20 0223  HGB 9.8*  --  8.2*  --   --   HCT 32.1*  --  27.0*  --   --   PLT 300  --  249  --   --   HEPARINUNFRC  --   --   --  0.31 0.19*  CREATININE 0.88  --  0.76  --   --   TROPONINIHS 111* 276* 3,745*  --   --     Estimated Creatinine Clearance: 56.8 mL/min (by C-G formula based on SCr of 0.76 mg/dL).  Assessment: 87 y.o. male with chest pain and Afib for heparin.   S/p cath today, found severe single-vessel coronary artery disease with heavily calcified mid LAD stenosis of up to 90%. Pending further workup of declining hemoglobin before considering further interventions. To restart heparin 4h after sheath removal.   2/15 AM update:  Heparin level low after re-start s/p cath  Goal of Therapy:  Heparin level 0.3-0.7 units/ml Monitor platelets by anticoagulation protocol: Yes   Plan:  Inc heparin to 950 units/hr 1200 heparin level  Narda Bonds, PharmD, BCPS Clinical Pharmacist Phone: [phone]

## 2020-06-24 ENCOUNTER — Inpatient Hospital Stay (HOSPITAL_COMMUNITY)
Admission: EM | Disposition: A | Discharge status: Home Health | Payer: Self-pay | Source: Home / Self Care | Attending: Cardiology

## 2020-06-24 HISTORY — PX: CORONARY ATHERECTOMY: CATH118238

## 2020-06-24 HISTORY — PX: INTRAVASCULAR ULTRASOUND/IVUS: CATH118244

## 2020-06-24 LAB — BASIC METABOLIC PANEL
Anion gap: 10 (ref 5–15)
BUN: 12 mg/dL (ref 8–23)
CO2: 19 mmol/L — ABNORMAL LOW (ref 22–32)
Calcium: 8.3 mg/dL — ABNORMAL LOW (ref 8.9–10.3)
Chloride: 107 mmol/L (ref 98–111)
Creatinine, Ser: 0.89 mg/dL (ref 0.61–1.24)
GFR, Estimated: >60 mL/min (ref >60)
Glucose, Bld: 85 mg/dL (ref 70–99)
Potassium: 3.5 mmol/L (ref 3.5–5.1)
Sodium: 136 mmol/L (ref 135–145)

## 2020-06-24 LAB — HEPARIN LEVEL (UNFRACTIONATED): Heparin Unfractionated: 0.44 IU/mL (ref 0.30–0.70)

## 2020-06-24 LAB — CBC
HCT: 28.5 % — ABNORMAL LOW (ref 39.0–52.0)
Hemoglobin: 9.3 g/dL — ABNORMAL LOW (ref 13.0–17.0)
MCH: 27.8 pg (ref 26.0–34.0)
MCHC: 32.6 g/dL (ref 30.0–36.0)
MCV: 85.1 fL (ref 80.0–100.0)
Platelets: 258 10*3/uL (ref 150–400)
RBC: 3.35 MIL/uL — ABNORMAL LOW (ref 4.22–5.81)
RDW: 13.3 % (ref 11.5–15.5)
WBC: 13.5 10*3/uL — ABNORMAL HIGH (ref 4.0–10.5)
nRBC: 0 % (ref 0.0–0.2)

## 2020-06-24 LAB — POCT ACTIVATED CLOTTING TIME
Activated Clotting Time: 464 seconds
Activated Clotting Time: 273 seconds

## 2020-06-24 SURGERY — CORONARY ATHERECTOMY
Anesthesia: LOCAL

## 2020-06-24 MED ORDER — HEPARIN (PORCINE) IN NACL 1000-0.9 UT/500ML-% IV SOLN
INTRAVENOUS | Status: DC | PRN
  Administered 2020-06-24 (×2): 500 mL

## 2020-06-24 MED ORDER — METOPROLOL TARTRATE 25 MG PO TABS
25.0000 mg | ORAL_TABLET | Freq: Two times a day (BID) | ORAL | Status: DC
  Administered 2020-06-24 – 2020-06-25 (×2): 25 mg via ORAL
  Filled 2020-06-24 (×2): qty 1

## 2020-06-24 MED ORDER — LIDOCAINE HCL (PF) 1 % IJ SOLN
INTRAMUSCULAR | Status: DC | PRN
  Administered 2020-06-24: 2 mL via INTRADERMAL

## 2020-06-24 MED ORDER — HEPARIN SODIUM (PORCINE) 1000 UNIT/ML IJ SOLN
INTRAMUSCULAR | Status: AC
  Filled 2020-06-24: qty 1

## 2020-06-24 MED ORDER — HEPARIN (PORCINE) IN NACL 1000-0.9 UT/500ML-% IV SOLN
INTRAVENOUS | Status: AC
  Filled 2020-06-24: qty 1000

## 2020-06-24 MED ORDER — SODIUM CHLORIDE 0.9% FLUSH: 3.0000 mL | INTRAVENOUS | Status: DC | PRN

## 2020-06-24 MED ORDER — LIDOCAINE HCL (PF) 1 % IJ SOLN
INTRAMUSCULAR | Status: AC
  Filled 2020-06-24: qty 30

## 2020-06-24 MED ORDER — HALOPERIDOL LACTATE 5 MG/ML IJ SOLN
2.0000 mg | Freq: Once | INTRAMUSCULAR | Status: AC
  Administered 2020-06-24: 2 mg via INTRAVENOUS
  Filled 2020-06-24: qty 1

## 2020-06-24 MED ORDER — POTASSIUM CHLORIDE CRYS ER 20 MEQ PO TBCR
40.0000 meq | EXTENDED_RELEASE_TABLET | Freq: Once | ORAL | Status: AC
  Administered 2020-06-24: 40 meq via ORAL
  Filled 2020-06-24: qty 2

## 2020-06-24 MED ORDER — NITROGLYCERIN 1 MG/10 ML FOR IR/CATH LAB
INTRA_ARTERIAL | Status: AC
  Filled 2020-06-24: qty 10

## 2020-06-24 MED ORDER — HEPARIN (PORCINE) IN NACL 1000-0.9 UT/500ML-% IV SOLN
INTRAVENOUS | Status: AC
  Filled 2020-06-24: qty 1500

## 2020-06-24 MED ORDER — SODIUM CHLORIDE 0.9 % WEIGHT BASED INFUSION
1.0000 mL/kg/h | INTRAVENOUS | Status: AC
  Administered 2020-06-24: 1 mL/kg/h via INTRAVENOUS

## 2020-06-24 MED ORDER — METOPROLOL TARTRATE 12.5 MG HALF TABLET
12.5000 mg | ORAL_TABLET | Freq: Once | ORAL | Status: AC
  Administered 2020-06-24: 12.5 mg via ORAL
  Filled 2020-06-24: qty 1

## 2020-06-24 MED ORDER — HALOPERIDOL 1 MG PO TABS
1.0000 mg | ORAL_TABLET | Freq: Every evening | ORAL | Status: DC | PRN
  Administered 2020-06-24: 1 mg via ORAL
  Filled 2020-06-24 (×2): qty 1

## 2020-06-24 MED ORDER — IOHEXOL 350 MG/ML SOLN
INTRAVENOUS | Status: DC | PRN
  Administered 2020-06-24: 175 mL

## 2020-06-24 MED ORDER — ONDANSETRON HCL 4 MG/2ML IJ SOLN: 4.0000 mg | Freq: Four times a day (QID) | INTRAMUSCULAR | Status: DC | PRN

## 2020-06-24 MED ORDER — SODIUM CHLORIDE 0.9% FLUSH
3.0000 mL | Freq: Two times a day (BID) | INTRAVENOUS | Status: DC
  Administered 2020-06-25 (×2): 3 mL via INTRAVENOUS

## 2020-06-24 MED ORDER — VERAPAMIL HCL 2.5 MG/ML IV SOLN
INTRAVENOUS | Status: AC
  Filled 2020-06-24: qty 2

## 2020-06-24 MED ORDER — HEPARIN SODIUM (PORCINE) 1000 UNIT/ML IJ SOLN
INTRAMUSCULAR | Status: DC | PRN
  Administered 2020-06-24: 6000 [IU] via INTRAVENOUS

## 2020-06-24 MED ORDER — NITROGLYCERIN 1 MG/10 ML FOR IR/CATH LAB
INTRA_ARTERIAL | Status: DC | PRN
  Administered 2020-06-24: 200 ug via INTRACORONARY

## 2020-06-24 MED ORDER — SODIUM CHLORIDE 0.9 % IV SOLN: 250.0000 mL | INTRAVENOUS | Status: DC | PRN

## 2020-06-24 SURGICAL SUPPLY — 26 items
BAG SNAP BAND KOVER 36X36 (MISCELLANEOUS) ×2 IMPLANT
BALLN SAPPHIRE 2.0X20 (BALLOONS) ×2
BALLN SAPPHIRE ~~LOC~~ 2.5X18 (BALLOONS) ×1 IMPLANT
BALLN SAPPHIRE ~~LOC~~ 3.25X18 (BALLOONS) ×1 IMPLANT
BALLN WOLVERINE 2.50X15 (BALLOONS) ×2
BALLN ~~LOC~~ EUPHORA RX 3.5X8 (BALLOONS) ×2
BALLOON SAPPHIRE 2.0X20 (BALLOONS) IMPLANT
BALLOON WOLVERINE 2.50X15 (BALLOONS) IMPLANT
BALLOON ~~LOC~~ EUPHORA RX 3.5X8 (BALLOONS) IMPLANT
CATH DRAGONFLY OPSTAR (CATHETERS) ×1 IMPLANT
CATH LAUNCHER 6FR EBU3.5 (CATHETERS) ×1 IMPLANT
COVER DOME SNAP 22 D (MISCELLANEOUS) ×1 IMPLANT
CROWN DIAMONDBACK CLASSIC 1.25 (BURR) ×1 IMPLANT
DEVICE RAD TR BAND REGULAR (VASCULAR PRODUCTS) ×1 IMPLANT
GLIDESHEATH SLEND SS 6F .021 (SHEATH) ×1 IMPLANT
GUIDEWIRE INQWIRE 1.5J.035X260 (WIRE) IMPLANT
INQWIRE 1.5J .035X260CM (WIRE) ×2
KIT ENCORE 26 ADVANTAGE (KITS) ×1 IMPLANT
KIT HEART LEFT (KITS) ×2 IMPLANT
LUBRICANT VIPERSLIDE CORONARY (MISCELLANEOUS) ×1 IMPLANT
PACK CARDIAC CATHETERIZATION (CUSTOM PROCEDURE TRAY) ×2 IMPLANT
STENT RESOLUTE ONYX 2.75X38 (Permanent Stent) ×1 IMPLANT
TRANSDUCER W/STOPCOCK (MISCELLANEOUS) ×2 IMPLANT
TUBING CIL FLEX 10 FLL-RA (TUBING) ×2 IMPLANT
WIRE RUNTHROUGH .014X180CM (WIRE) ×1 IMPLANT
WIRE VIPERWIRE COR FLEX .012 (WIRE) ×1 IMPLANT

## 2020-06-24 NOTE — Interval H&P Note (Signed)
Cath Lab Visit (complete for each Cath Lab visit)  Clinical Evaluation Leading to the Procedure:   ACS: Yes.    Non-ACS:  n/a   History and Physical Interval Note:  06/24/2020 1:18 PM  James Iha Sr.  has presented today for surgery, with the diagnosis of cad.  The various methods of treatment have been discussed with the patient and family. After consideration of risks, benefits and other options for treatment, the patient has consented to  Procedure(s): CORONARY ATHERECTOMY (N/A) as a surgical intervention.  The patient's history has been reviewed, patient examined, no change in status, stable for surgery.  I have reviewed the patient's chart and labs.  Questions were answered to the patient's satisfaction.     Kathlyn [place]

## 2020-06-24 NOTE — H&P (View-Only) (Signed)
Progress Note  Patient Name: James Mrozek Sr. Date of Encounter: 06/24/2020  [facility] Cardiologist: Quay Burow, MD   Subjective   Had another rough night with confusion. Family was at the bedside overnight. No chest pain  Inpatient Medications    Scheduled Meds:  [START ON 06/25/2020] aspirin  81 mg Oral Daily   atorvastatin  80 mg Oral Daily   clopidogrel  75 mg Oral Daily   levothyroxine  150 mcg Oral Q0600   melatonin  3 mg Oral QHS   metoprolol tartrate  12.5 mg Oral BID   pantoprazole  40 mg Oral Q0600   sodium chloride flush  3 mL Intravenous Q12H   Continuous Infusions:  sodium chloride     sodium chloride 1 mL/kg/hr (06/24/20 0528)   ferumoxytol Stopped (06/24/20 0555)   heparin 1,250 Units/hr (06/24/20 0003)   PRN Meds: sodium chloride, acetaminophen, albuterol, ipratropium-albuterol, sodium chloride flush   Vital Signs    Vitals:   06/23/20 1139 06/23/20 2216 06/24/20 0412 06/24/20 0831  BP:  125/89 138/65 (!) 141/99  Pulse: 85 81 89 79  Resp:  (!) 22 18 18   Temp:  98.3 F (36.8 C) (!) 97.3 F (36.3 C) 97.9 F (36.6 C)  TempSrc:  Oral Oral Oral  SpO2:  95% 97% 98%  Weight:   59 kg   Height:        Intake/Output Summary (Last 24 hours) at 06/24/2020 0956 Last data filed at 06/24/2020 9024 Gross per 24 hour  Intake 1215.59 ml  Output 1050 ml  Net 165.59 ml   Last 3 Weights 06/24/2020 06/23/2020 06/22/2020  Weight (lbs) 130 lb 1.1 oz 133 lb 9.6 oz 139 lb 5.3 oz  Weight (kg) 59 kg 60.6 kg 63.2 kg      Telemetry    SR with PACs - Personally Reviewed  Physical Exam   GEN: No acute distress.   Neck: No JVD Cardiac: RRR, no murmurs, rubs, or gallops.  Respiratory: Clear to auscultation bilaterally. GI: Soft, nontender, non-distended  MS: No edema; No deformity. Right radial cath site with bruising but no hematoma Neuro:  Nonfocal  Psych: Normal affect   Labs    High Sensitivity Troponin:   Recent Labs  Lab 06/21/20 1906  06/21/20 2040 06/22/20 0513  TROPONINIHS 111* 276* 3,745*      Chemistry Recent Labs  Lab 06/22/20 0513 06/23/20 0223 06/24/20 0810  NA 136 134* 136  K 3.8 3.7 3.5  CL 105 104 107  CO2 23 22 19*  GLUCOSE 94 97 85  BUN 18 13 12   CREATININE 0.76 0.75 0.89  CALCIUM 8.0* 8.5* 8.3*  GFRNONAA >60 >60 >60  ANIONGAP 8 8 10      Hematology Recent Labs  Lab 06/22/20 0513 06/23/20 0223 06/24/20 0810  WBC 13.2* 23.5* 13.5*  RBC 3.06* 3.62*  3.59* 3.35*  HGB 8.2* 10.1* 9.3*  HCT 27.0* 31.5* 28.5*  MCV 88.2 87.0 85.1  MCH 26.8 27.9 27.8  MCHC 30.4 32.1 32.6  RDW 13.2 13.3 13.3  PLT 249 351 258    BNPNo results for input(s): BNP, PROBNP in the last 168 hours.   DDimer No results for input(s): DDIMER in the last 168 hours.   Radiology    CARDIAC CATHETERIZATION  Result Date: 06/22/2020 Conclusions: 1. Severe single-vessel coronary artery disease with heavily calcified mid LAD stenosis of up to 90%. 2. Mild to moderate, ostial LMCA, proximal LAD, OM2, and proximal RCA disease. 3. Moderately to severely  reduced left ventricular systolic function with mid/apical anterior and apical hypokinesis/akinesis. 4. Moderately elevated left ventricular filling pressure (LVEDP ~25 mmHg). Recommendations: 1. Images reviewed with Dr. Gwenlyn Found.  Given worsening anemia of uncertain etiology, recommend further workup of declining hemoglobin.  IV heparin will be restarted in 4 hours in the setting of ACS and PAF.  Defer adding P2Y12 inhibitor at this time. 2. Aggressive secondary prevention. 3. If no active bleeding is identified and hemoglobin stabilizes, recommend PCI/atherectomy to mid LAD. 4. Optimize evidence-based heart failure therapy. Nelva Bush, MD [facility]   ECHOCARDIOGRAM COMPLETE  Result Date: 06/23/2020    ECHOCARDIOGRAM REPORT   Patient Name:   James Turton Sr. Date of Exam: 06/23/2020 Medical Rec #:  [id number]             Height:       72.0 in Accession #:    [id number]             Weight:       133.6 lb Date of Birth:  1931-06-17              BSA:          1.795 m Patient Age:    87 years              BP:           141/73 mmHg Patient Gender: M                     HR:           72 bpm. Exam Location:  Inpatient Procedure: 2D Echo, Cardiac Doppler and Color Doppler Indications:    NSTEMI I21.4  History:        Patient has prior history of Echocardiogram examinations, most                 recent 01/17/2019. COPD; Risk Factors:Dyslipidemia.  Sonographer:    Bernadene Person RDCS Referring Phys: [id number] [place]  1. Left ventricular ejection fraction, by estimation, is 55 to 60%. The left ventricle has normal function. The left ventricle has no regional wall motion abnormalities. Left ventricular diastolic function could not be evaluated.  2. Right ventricular systolic function is normal. The right ventricular size is normal. There is moderately elevated pulmonary artery systolic pressure. The estimated right ventricular systolic pressure is 02.6 mmHg.  3. Left atrial size was mildly dilated.  4. The mitral valve is normal in structure. Trivial mitral valve regurgitation. No evidence of mitral stenosis.  5. Tricuspid valve regurgitation is moderate.  6. The aortic valve is tricuspid. There is mild calcification of the aortic valve. There is moderate thickening of the aortic valve. Aortic valve regurgitation is trivial. Mild to moderate aortic valve sclerosis/calcification is present, without any evidence of aortic stenosis.  7. The inferior vena cava is dilated in size with >50% respiratory variability, suggesting right atrial pressure of 8 mmHg. FINDINGS  Left Ventricle: Left ventricular ejection fraction, by estimation, is 55 to 60%. The left ventricle has normal function. The left ventricle has no regional wall motion abnormalities. The left ventricular internal cavity size was normal in size. There is  no left ventricular hypertrophy. Left ventricular diastolic  function could not be evaluated due to incessant ectopy. Left ventricular diastolic function could not be evaluated. Right Ventricle: The right ventricular size is normal. No increase in right ventricular wall thickness. Right ventricular systolic function is normal. There is moderately elevated pulmonary  artery systolic pressure. The tricuspid regurgitant velocity is 3.54 m/s, and with an assumed right atrial pressure of 8 mmHg, the estimated right ventricular systolic pressure is 16.1 mmHg. Left Atrium: Left atrial size was mildly dilated. Right Atrium: Right atrial size was normal in size. Pericardium: There is no evidence of pericardial effusion. Mitral Valve: The mitral valve is normal in structure. Trivial mitral valve regurgitation. No evidence of mitral valve stenosis. Tricuspid Valve: The tricuspid valve is normal in structure. Tricuspid valve regurgitation is moderate . No evidence of tricuspid stenosis. Aortic Valve: The aortic valve is tricuspid. There is mild calcification of the aortic valve. There is moderate thickening of the aortic valve. Aortic valve regurgitation is trivial. Aortic regurgitation PHT measures 405 msec. Mild to moderate aortic valve sclerosis/calcification is present, without any evidence of aortic stenosis. Pulmonic Valve: The pulmonic valve was normal in structure. Pulmonic valve regurgitation is not visualized. No evidence of pulmonic stenosis. Aorta: The aortic root is normal in size and structure. Venous: The inferior vena cava is dilated in size with greater than 50% respiratory variability, suggesting right atrial pressure of 8 mmHg. IAS/Shunts: No atrial level shunt detected by color flow Doppler.  LEFT VENTRICLE PLAX 2D LVIDd:         5.00 cm LVIDs:         3.50 cm LV PW:         0.90 cm LV IVS:        0.90 cm LVOT diam:     2.20 cm LV SV:         84 LV SV Index:   47 LVOT Area:     3.80 cm  RIGHT VENTRICLE TAPSE (M-mode): 2.8 cm LEFT ATRIUM             Index       RIGHT  ATRIUM           Index LA diam:        3.70 cm 2.06 cm/m  RA Area:     17.70 cm LA Vol (A2C):   54.5 ml 30.37 ml/m RA Volume:   48.50 ml  27.02 ml/m LA Vol (A4C):   56.3 ml 31.37 ml/m LA Biplane Vol: 55.3 ml 30.81 ml/m  AORTIC VALVE LVOT Vmax:   104.00 cm/s LVOT Vmean:  62.700 cm/s LVOT VTI:    0.222 m AI PHT:      405 msec  AORTA Ao Root diam: 3.50 cm Ao Asc diam:  3.70 cm TRICUSPID VALVE TR Peak grad:   50.1 mmHg TR Vmax:        354.00 cm/s  SHUNTS Systemic VTI:  0.22 m Systemic Diam: 2.20 cm Sanda Klein MD Electronically signed by Sanda Klein MD Signature Date/Time: 06/23/2020/3:28:42 PM    Final     Cardiac Studies   Cath: 06/22/20   Conclusions: Severe single-vessel coronary artery disease with heavily calcified mid LAD stenosis of up to 90%. Mild to moderate, ostial LMCA, proximal LAD, OM2, and proximal RCA disease. Moderately to severely reduced left ventricular systolic function with mid/apical anterior and apical hypokinesis/akinesis. Moderately elevated left ventricular filling pressure (LVEDP ~25 mmHg).   Recommendations: Images reviewed with Dr. Gwenlyn Found.  Given worsening anemia of uncertain etiology, recommend further workup of declining hemoglobin.  IV heparin will be restarted in 4 hours in the setting of ACS and PAF.  Defer adding P2Y12 inhibitor at this time. Aggressive secondary prevention. If no active bleeding is identified and hemoglobin stabilizes, recommend PCI/atherectomy to mid LAD.  Optimize evidence-based heart failure therapy.   Nelva Bush, MD   Diagnostic Dominance: Right    Echo: 06/23/20  IMPRESSIONS     1. Left ventricular ejection fraction, by estimation, is 55 to 60%. The  left ventricle has normal function. The left ventricle has no regional  wall motion abnormalities. Left ventricular diastolic function could not  be evaluated.   2. Right ventricular systolic function is normal. The right ventricular  size is normal. There is moderately  elevated pulmonary artery systolic  pressure. The estimated right ventricular systolic pressure is 86.7 mmHg.   3. Left atrial size was mildly dilated.   4. The mitral valve is normal in structure. Trivial mitral valve  regurgitation. No evidence of mitral stenosis.   5. Tricuspid valve regurgitation is moderate.   6. The aortic valve is tricuspid. There is mild calcification of the  aortic valve. There is moderate thickening of the aortic valve. Aortic  valve regurgitation is trivial. Mild to moderate aortic valve  sclerosis/calcification is present, without any  evidence of aortic stenosis.   7. The inferior vena cava is dilated in size with >50% respiratory  variability, suggesting right atrial pressure of 8 mmHg.   Patient Profile     87 y.o. male with PMH of NSVT, COPD, Hypothyroidism, and HTN who presented with chest pain and new onset Afib/NSTEMI.  Assessment & Plan    1. NSTEMI: Reports developing chest pain yesterday that was intermittent throughout the day with associated shortness of breath. hsTn rose to 3745. EKG on admission showed afib RVR with ST depression in lateral leads which persisted once he had converted to SR. Underwent cardiac cath noted above with Dr. Saunders Revel showing heavily calcified mLAD lesion, along with 60% in mRCA. He was not stented given his anemia. Seen by GI but considered high risk for procedure. This was discussed with the patient and son who understand he will need PCI and close monitoring of his CBC. Planned for cath today with Dr. Fletcher Anon.  -- he was loaded with plavix yesterday, now on 75mg  daily -- on ASA, statin, and BB   2. New onset afib RVR: denies any specific palpitations prior to admission, suspect his shortness of breath was related to Afib.  -- given IV diltiazem IVP and converted to SR. Having intermittent episodes of afib with sinus beats as well -- currently on IV heparin -- CHA2DS2-VASc Score of at least 4, suspect will need [place] pending  clinic work up -- will further titrate to metoprolol 25mg  BID   3. Anemia: Hgb 9.8>>8.2>>10.1>>9.3 this morning. Reports having blood done recently through PCP office and asked to check stool cards.  -- denies any obvious bleeding or dark tarry stools prior to admission. FOBT were negative -- seen by GI, but high risk for procedure with cardiac disease -- iron def, recieved IV feraheme yesterday   4. Hypokalemia: K+ 3.5 this morning, will suppl  -- daily BMET   5. HTN: controlled on current regimen -- switching to metoprolol 25mg  BID   6. ICM: EF reported at 35% on LV gram, but echo showed improvement in 50-55% with no rWMA noted. No signs of volume overload on exam -- tolerating low dose BB, will hold on adding ACE/ARB given need for cath tomorrow   7. Leukocytosis: WBC elevated at 23.5>>improve to 13.5 this morning. No fevers overnight or s/s of infection. Suspect inflammatory in response to MI?  -- will follow for now  PT following for dispo needs post cath  as he lives at home alone. Family reports freq falls, broke his arm and some teeth last year. Will need to discuss risk/benefit of DAPT +/- [place] at discharge   For questions or updates, please contact [place] Please consult [email or web address] for contact info under        Signed, Reino Bellis, NP  06/24/2020, 9:56 AM    Agree with note by Reino Bellis NP-C  Patient mated with non-STEMI, A. fib with RVR converted to sinus rhythm.  Cardiac catheterization revealed high-grade calcified mid LAD stenosis.  His hemoglobin has been stable.  He has had some sundowning and disorientation at night.  He is scheduled for LAD atherectomy, PCI drug-eluting stent today.  Given his age and fall risk I do not think he is a good candidate for oral anticoagulation.   Lorretta Harp, M.D., [place], [facility], [place] [address] [place], [place]  [id number]  [phone] 06/24/2020 12:34  PM

## 2020-06-24 NOTE — Progress Notes (Addendum)
Progress Note  Patient Name: James Thayne Sr. Date of Encounter: 06/24/2020  [facility] Cardiologist: Quay Burow, MD   Subjective   Had another rough night with confusion. Family was at the bedside overnight. No chest pain  Inpatient Medications    Scheduled Meds:  [START ON 06/25/2020] aspirin  81 mg Oral Daily   atorvastatin  80 mg Oral Daily   clopidogrel  75 mg Oral Daily   levothyroxine  150 mcg Oral Q0600   melatonin  3 mg Oral QHS   metoprolol tartrate  12.5 mg Oral BID   pantoprazole  40 mg Oral Q0600   sodium chloride flush  3 mL Intravenous Q12H   Continuous Infusions:  sodium chloride     sodium chloride 1 mL/kg/hr (06/24/20 0528)   ferumoxytol Stopped (06/24/20 0555)   heparin 1,250 Units/hr (06/24/20 0003)   PRN Meds: sodium chloride, acetaminophen, albuterol, ipratropium-albuterol, sodium chloride flush   Vital Signs    Vitals:   06/23/20 1139 06/23/20 2216 06/24/20 0412 06/24/20 0831  BP:  125/89 138/65 (!) 141/99  Pulse: 85 81 89 79  Resp:  (!) 22 18 18   Temp:  98.3 F (36.8 C) (!) 97.3 F (36.3 C) 97.9 F (36.6 C)  TempSrc:  Oral Oral Oral  SpO2:  95% 97% 98%  Weight:   59 kg   Height:        Intake/Output Summary (Last 24 hours) at 06/24/2020 0956 Last data filed at 06/24/2020 5284 Gross per 24 hour  Intake 1215.59 ml  Output 1050 ml  Net 165.59 ml   Last 3 Weights 06/24/2020 06/23/2020 06/22/2020  Weight (lbs) 130 lb 1.1 oz 133 lb 9.6 oz 139 lb 5.3 oz  Weight (kg) 59 kg 60.6 kg 63.2 kg      Telemetry    SR with PACs - Personally Reviewed  Physical Exam   GEN: No acute distress.   Neck: No JVD Cardiac: RRR, no murmurs, rubs, or gallops.  Respiratory: Clear to auscultation bilaterally. GI: Soft, nontender, non-distended  MS: No edema; No deformity. Right radial cath site with bruising but no hematoma Neuro:  Nonfocal  Psych: Normal affect   Labs    High Sensitivity Troponin:   Recent Labs  Lab 06/21/20 1906  06/21/20 2040 06/22/20 0513  TROPONINIHS 111* 276* 3,745*      Chemistry Recent Labs  Lab 06/22/20 0513 06/23/20 0223 06/24/20 0810  NA 136 134* 136  K 3.8 3.7 3.5  CL 105 104 107  CO2 23 22 19*  GLUCOSE 94 97 85  BUN 18 13 12   CREATININE 0.76 0.75 0.89  CALCIUM 8.0* 8.5* 8.3*  GFRNONAA >60 >60 >60  ANIONGAP 8 8 10      Hematology Recent Labs  Lab 06/22/20 0513 06/23/20 0223 06/24/20 0810  WBC 13.2* 23.5* 13.5*  RBC 3.06* 3.62*  3.59* 3.35*  HGB 8.2* 10.1* 9.3*  HCT 27.0* 31.5* 28.5*  MCV 88.2 87.0 85.1  MCH 26.8 27.9 27.8  MCHC 30.4 32.1 32.6  RDW 13.2 13.3 13.3  PLT 249 351 258    BNPNo results for input(s): BNP, PROBNP in the last 168 hours.   DDimer No results for input(s): DDIMER in the last 168 hours.   Radiology    CARDIAC CATHETERIZATION  Result Date: 06/22/2020 Conclusions: 1. Severe single-vessel coronary artery disease with heavily calcified mid LAD stenosis of up to 90%. 2. Mild to moderate, ostial LMCA, proximal LAD, OM2, and proximal RCA disease. 3. Moderately to severely  reduced left ventricular systolic function with mid/apical anterior and apical hypokinesis/akinesis. 4. Moderately elevated left ventricular filling pressure (LVEDP ~25 mmHg). Recommendations: 1. Images reviewed with Dr. Gwenlyn Found.  Given worsening anemia of uncertain etiology, recommend further workup of declining hemoglobin.  IV heparin will be restarted in 4 hours in the setting of ACS and PAF.  Defer adding P2Y12 inhibitor at this time. 2. Aggressive secondary prevention. 3. If no active bleeding is identified and hemoglobin stabilizes, recommend PCI/atherectomy to mid LAD. 4. Optimize evidence-based heart failure therapy. Nelva Bush, MD [facility]   ECHOCARDIOGRAM COMPLETE  Result Date: 06/23/2020    ECHOCARDIOGRAM REPORT   Patient Name:   James Lemmons Sr. Date of Exam: 06/23/2020 Medical Rec #:  [id number]             Height:       72.0 in Accession #:    [id number]             Weight:       133.6 lb Date of Birth:  [birth year]              BSA:          1.795 m Patient Age:    87 years              BP:           141/73 mmHg Patient Gender: M                     HR:           72 bpm. Exam Location:  Inpatient Procedure: 2D Echo, Cardiac Doppler and Color Doppler Indications:    NSTEMI I21.4  History:        Patient has prior history of Echocardiogram examinations, most                 recent 01/17/2019. COPD; Risk Factors:Dyslipidemia.  Sonographer:    Bernadene Person RDCS Referring Phys: [id number] [place]  1. Left ventricular ejection fraction, by estimation, is 55 to 60%. The left ventricle has normal function. The left ventricle has no regional wall motion abnormalities. Left ventricular diastolic function could not be evaluated.  2. Right ventricular systolic function is normal. The right ventricular size is normal. There is moderately elevated pulmonary artery systolic pressure. The estimated right ventricular systolic pressure is 96.7 mmHg.  3. Left atrial size was mildly dilated.  4. The mitral valve is normal in structure. Trivial mitral valve regurgitation. No evidence of mitral stenosis.  5. Tricuspid valve regurgitation is moderate.  6. The aortic valve is tricuspid. There is mild calcification of the aortic valve. There is moderate thickening of the aortic valve. Aortic valve regurgitation is trivial. Mild to moderate aortic valve sclerosis/calcification is present, without any evidence of aortic stenosis.  7. The inferior vena cava is dilated in size with >50% respiratory variability, suggesting right atrial pressure of 8 mmHg. FINDINGS  Left Ventricle: Left ventricular ejection fraction, by estimation, is 55 to 60%. The left ventricle has normal function. The left ventricle has no regional wall motion abnormalities. The left ventricular internal cavity size was normal in size. There is  no left ventricular hypertrophy. Left ventricular diastolic  function could not be evaluated due to incessant ectopy. Left ventricular diastolic function could not be evaluated. Right Ventricle: The right ventricular size is normal. No increase in right ventricular wall thickness. Right ventricular systolic function is normal. There is moderately elevated pulmonary  artery systolic pressure. The tricuspid regurgitant velocity is 3.54 m/s, and with an assumed right atrial pressure of 8 mmHg, the estimated right ventricular systolic pressure is 20.2 mmHg. Left Atrium: Left atrial size was mildly dilated. Right Atrium: Right atrial size was normal in size. Pericardium: There is no evidence of pericardial effusion. Mitral Valve: The mitral valve is normal in structure. Trivial mitral valve regurgitation. No evidence of mitral valve stenosis. Tricuspid Valve: The tricuspid valve is normal in structure. Tricuspid valve regurgitation is moderate . No evidence of tricuspid stenosis. Aortic Valve: The aortic valve is tricuspid. There is mild calcification of the aortic valve. There is moderate thickening of the aortic valve. Aortic valve regurgitation is trivial. Aortic regurgitation PHT measures 405 msec. Mild to moderate aortic valve sclerosis/calcification is present, without any evidence of aortic stenosis. Pulmonic Valve: The pulmonic valve was normal in structure. Pulmonic valve regurgitation is not visualized. No evidence of pulmonic stenosis. Aorta: The aortic root is normal in size and structure. Venous: The inferior vena cava is dilated in size with greater than 50% respiratory variability, suggesting right atrial pressure of 8 mmHg. IAS/Shunts: No atrial level shunt detected by color flow Doppler.  LEFT VENTRICLE PLAX 2D LVIDd:         5.00 cm LVIDs:         3.50 cm LV PW:         0.90 cm LV IVS:        0.90 cm LVOT diam:     2.20 cm LV SV:         84 LV SV Index:   47 LVOT Area:     3.80 cm  RIGHT VENTRICLE TAPSE (M-mode): 2.8 cm LEFT ATRIUM             Index       RIGHT  ATRIUM           Index LA diam:        3.70 cm 2.06 cm/m  RA Area:     17.70 cm LA Vol (A2C):   54.5 ml 30.37 ml/m RA Volume:   48.50 ml  27.02 ml/m LA Vol (A4C):   56.3 ml 31.37 ml/m LA Biplane Vol: 55.3 ml 30.81 ml/m  AORTIC VALVE LVOT Vmax:   104.00 cm/s LVOT Vmean:  62.700 cm/s LVOT VTI:    0.222 m AI PHT:      405 msec  AORTA Ao Root diam: 3.50 cm Ao Asc diam:  3.70 cm TRICUSPID VALVE TR Peak grad:   50.1 mmHg TR Vmax:        354.00 cm/s  SHUNTS Systemic VTI:  0.22 m Systemic Diam: 2.20 cm Sanda Klein MD Electronically signed by Sanda Klein MD Signature Date/Time: 06/23/2020/3:28:42 PM    Final     Cardiac Studies   Cath: 06/22/20   Conclusions: Severe single-vessel coronary artery disease with heavily calcified mid LAD stenosis of up to 90%. Mild to moderate, ostial LMCA, proximal LAD, OM2, and proximal RCA disease. Moderately to severely reduced left ventricular systolic function with mid/apical anterior and apical hypokinesis/akinesis. Moderately elevated left ventricular filling pressure (LVEDP ~25 mmHg).   Recommendations: Images reviewed with Dr. Gwenlyn Found.  Given worsening anemia of uncertain etiology, recommend further workup of declining hemoglobin.  IV heparin will be restarted in 4 hours in the setting of ACS and PAF.  Defer adding P2Y12 inhibitor at this time. Aggressive secondary prevention. If no active bleeding is identified and hemoglobin stabilizes, recommend PCI/atherectomy to mid LAD.  Optimize evidence-based heart failure therapy.   Nelva Bush, MD   Diagnostic Dominance: Right    Echo: 06/23/20  IMPRESSIONS     1. Left ventricular ejection fraction, by estimation, is 55 to 60%. The  left ventricle has normal function. The left ventricle has no regional  wall motion abnormalities. Left ventricular diastolic function could not  be evaluated.   2. Right ventricular systolic function is normal. The right ventricular  size is normal. There is moderately  elevated pulmonary artery systolic  pressure. The estimated right ventricular systolic pressure is 16.6 mmHg.   3. Left atrial size was mildly dilated.   4. The mitral valve is normal in structure. Trivial mitral valve  regurgitation. No evidence of mitral stenosis.   5. Tricuspid valve regurgitation is moderate.   6. The aortic valve is tricuspid. There is mild calcification of the  aortic valve. There is moderate thickening of the aortic valve. Aortic  valve regurgitation is trivial. Mild to moderate aortic valve  sclerosis/calcification is present, without any  evidence of aortic stenosis.   7. The inferior vena cava is dilated in size with >50% respiratory  variability, suggesting right atrial pressure of 8 mmHg.   Patient Profile     87 y.o. male with PMH of NSVT, COPD, Hypothyroidism, and HTN who presented with chest pain and new onset Afib/NSTEMI.  Assessment & Plan    1. NSTEMI: Reports developing chest pain yesterday that was intermittent throughout the day with associated shortness of breath. hsTn rose to 3745. EKG on admission showed afib RVR with ST depression in lateral leads which persisted once he had converted to SR. Underwent cardiac cath noted above with Dr. Saunders Revel showing heavily calcified mLAD lesion, along with 60% in mRCA. He was not stented given his anemia. Seen by GI but considered high risk for procedure. This was discussed with the patient and son who understand he will need PCI and close monitoring of his CBC. Planned for cath today with Dr. Fletcher Anon.  -- he was loaded with plavix yesterday, now on 75mg  daily -- on ASA, statin, and BB   2. New onset afib RVR: denies any specific palpitations prior to admission, suspect his shortness of breath was related to Afib.  -- given IV diltiazem IVP and converted to SR. Having intermittent episodes of afib with sinus beats as well -- currently on IV heparin -- CHA2DS2-VASc Score of at least 4, suspect will need [place] pending  clinic work up -- will further titrate to metoprolol 25mg  BID   3. Anemia: Hgb 9.8>>8.2>>10.1>>9.3 this morning. Reports having blood done recently through PCP office and asked to check stool cards.  -- denies any obvious bleeding or dark tarry stools prior to admission. FOBT were negative -- seen by GI, but high risk for procedure with cardiac disease -- iron def, recieved IV feraheme yesterday   4. Hypokalemia: K+ 3.5 this morning, will suppl  -- daily BMET   5. HTN: controlled on current regimen -- switching to metoprolol 25mg  BID   6. ICM: EF reported at 35% on LV gram, but echo showed improvement in 50-55% with no rWMA noted. No signs of volume overload on exam -- tolerating low dose BB, will hold on adding ACE/ARB given need for cath tomorrow   7. Leukocytosis: WBC elevated at 23.5>>improve to 13.5 this morning. No fevers overnight or s/s of infection. Suspect inflammatory in response to MI?  -- will follow for now  PT following for dispo needs post cath  as he lives at home alone. Family reports freq falls, broke his arm and some teeth last year. Will need to discuss risk/benefit of DAPT +/- [place] at discharge   For questions or updates, please contact [place] Please consult [email or web address] for contact info under        Signed, Reino Bellis, NP  06/24/2020, 9:56 AM    Agree with note by Reino Bellis NP-C  Patient mated with non-STEMI, A. fib with RVR converted to sinus rhythm.  Cardiac catheterization revealed high-grade calcified mid LAD stenosis.  His hemoglobin has been stable.  He has had some sundowning and disorientation at night.  He is scheduled for LAD atherectomy, PCI drug-eluting stent today.  Given his age and fall risk I do not think he is a good candidate for oral anticoagulation.   Lorretta Harp, M.D., [place], [facility], [place] [address] [place], [place]  [address]  [phone] 06/24/2020 12:34  PM

## 2020-06-24 NOTE — Progress Notes (Signed)
[place] for heparin Indication: chest pain/ACS and atrial fibrillation  Allergies  Allergen Reactions  . Clarithromycin     REACTION: GI upset    Patient Measurements: Height: 6' (182.9 cm) Weight: 59 kg (130 lb 1.1 oz) IBW/kg (Calculated) : 77.6 Heparin Dosing Weight: 61.2 kg   Vital Signs: Temp: 97.9 F (36.6 C) (02/16 0831) Temp Source: Oral (02/16 0831) BP: 127/85 (02/16 1112) Pulse Rate: 70 (02/16 1112)  Labs: Recent Labs    06/21/20 1906 06/21/20 2040 06/22/20 0513 06/22/20 0648 06/23/20 0223 06/23/20 1148 06/23/20 2244 06/24/20 0810 06/24/20 0936  HGB 9.8*  --  8.2*  --  10.1*  --   --  9.3*  --   HCT 32.1*  --  27.0*  --  31.5*  --   --  28.5*  --   PLT 300  --  249  --  351  --   --  258  --   HEPARINUNFRC  --   --   --    < > 0.19* 0.22* 0.21*  --  0.44  CREATININE 0.88  --  0.76  --  0.75  --   --  0.89  --   TROPONINIHS 111* 276* 3,745*  --   --   --   --   --   --    < > = values in this interval not displayed.    Estimated Creatinine Clearance: 49.7 mL/min (by C-G formula based on SCr of 0.89 mg/dL).  Assessment: 87 y.o. male with chest pain and Afib, s/p cath and awaiting PCI, for heparin.  Heparin level this morning is therapeutic. Planning for cath today. Hgb 9.3, plt wnl, no bleeding.   Goal of Therapy:  Heparin level 0.3-0.7 units/ml Monitor platelets by anticoagulation protocol: Yes   Plan:  Continue heparin 1250 units/hr Follow up post cath plans - check heparin level with AM labs if continued  Rebbeca Paul, PharmD PGY1 Pharmacy Resident 06/24/2020 12:00 PM  Please check [email or web address] for unit-specific pharmacy phone numbers.

## 2020-06-24 NOTE — Progress Notes (Signed)
Patient resting quietly. Pleasantly confused; reorients when talked to. Daughter staying with patient in holding area; the son is planning on staying with patient tonight.

## 2020-06-24 NOTE — Progress Notes (Signed)
On arrival to holding, patient trying to get out off stretcher. Daughter called and asked to come here and stay w/patient; here now. Patient calming down. Dr. Fletcher Anon at bedside talking w/daughter. Arm board on rt forearm.

## 2020-06-24 NOTE — Evaluation (Addendum)
Physical Therapy Evaluation Patient Details Name: James Conde Sr. MRN: [id number] DOB: [birth year] Today's Date: 06/24/2020   History of Present Illness  87 y.o. male with PMH of NSVT, COPD, Hypothyroidism, and HTN who presented with chest pain and new onset Afib/NSTEMI.  Clinical Impression  Prior to admission, pt lives with his spouse and is independent. Pt presents with acute confusion, decreased cardiopulmonary endurance and mild balance deficits. Pt oriented to self only; however, very pleasant and following all one step commands. Pt ambulating x 200 feet with a walker at a min guard assist level, SpO2 100% on RA, HR 91-120 bpm. Discussed with pt son methods to improve orientation including leaving blinds open during the day, having pt sit up in chair out of bed for meals, re-orienting him to place/time/situation. Will continue to follow acutely to progress as tolerated.     Follow Up Recommendations Home health PT;Supervision/Assistance - 24 hour    Equipment Recommendations  Rolling walker with 5" wheels    Recommendations for Other Services       Precautions / Restrictions Precautions Precautions: Fall Restrictions Weight Bearing Restrictions: No      Mobility  Bed Mobility Overal bed mobility: Needs Assistance Bed Mobility: Supine to Sit     Supine to sit: Supervision          Transfers Overall transfer level: Needs assistance Equipment used: Rolling walker (2 wheeled) Transfers: Sit to/from Stand Sit to Stand: Min guard         General transfer comment: Good power up  Ambulation/Gait Ambulation/Gait assistance: Min guard Gait Distance (Feet): 200 Feet Assistive device: Rolling walker (2 wheeled) Gait Pattern/deviations: Step-through pattern;Decreased stride length     General Gait Details: Min guard for safety, cues for direction, no overt LOB  Stairs            Wheelchair Mobility    Modified Rankin (Stroke Patients Only)        Balance Overall balance assessment: Mild deficits observed, not formally tested                                           Pertinent Vitals/Pain Pain Assessment: No/denies pain    Home Living Family/patient expects to be discharged to:: Private residence Living Arrangements: Spouse/significant other Available Help at Discharge: Family Type of Home: House Home Access: Stairs to enter   Technical brewer of Steps: 2 (from garage (low steps)) Home Layout: One level Home Equipment: None (pt wife has equipment, but pt does not)      Prior Function Level of Independence: Independent         Comments: 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, takes care of IADL's     Hand Dominance        Extremity/Trunk Assessment   Upper Extremity Assessment Upper Extremity Assessment: Overall WFL for tasks assessed    Lower Extremity Assessment Lower Extremity Assessment: Overall WFL for tasks assessed       Communication   Communication: HOH  Cognition Arousal/Alertness: Awake/alert Behavior During Therapy: WFL for tasks assessed/performed Overall Cognitive Status: Impaired/Different from baseline Area of Impairment: Orientation;Memory;Awareness                 Orientation Level: Disoriented to;Place;Time;Situation   Memory: Decreased short-term memory     Awareness: Intellectual   General Comments: Pt acutely confused; cognition typically WFL at baseline. A&Ox1, pt son reports pt  trying to "turn the coffee pot on this morning with the bed controls." Pt very pleasant, easy to direct and following all commands      General Comments      Exercises     Assessment/Plan    PT Assessment Patient needs continued PT services  PT Problem List Decreased strength;Decreased activity tolerance;Decreased balance;Decreased mobility;Decreased cognition       PT Treatment Interventions DME instruction;Gait training;Stair training;Functional mobility  training;Therapeutic activities;Therapeutic exercise;Balance training;Patient/family education    PT Goals (Current goals can be found in the Care Plan section)  Acute Rehab PT Goals Patient Stated Goal: get out of bed PT Goal Formulation: With patient/family Time For Goal Achievement: 07/08/20 Potential to Achieve Goals: Good    Frequency Min 3X/week   Barriers to discharge        Co-evaluation               AM-PAC PT "6 Clicks" Mobility  Outcome Measure Help needed turning from your back to your side while in a flat bed without using bedrails?: None Help needed moving from lying on your back to sitting on the side of a flat bed without using bedrails?: None Help needed moving to and from a bed to a chair (including a wheelchair)?: None Help needed standing up from a chair using your arms (e.g., wheelchair or bedside chair)?: A Little Help needed to walk in hospital room?: A Little Help needed climbing 3-5 steps with a railing? : A Little 6 Click Score: 21    End of Session Equipment Utilized During Treatment: Gait belt Activity Tolerance: Patient tolerated treatment well Patient left: in bed;with call bell/phone within reach;with bed alarm set;with family/visitor present Nurse Communication: Mobility status PT Visit Diagnosis: Unsteadiness on feet (R26.81)    Time: 4970-2637 PT Time Calculation (min) (ACUTE ONLY): 19 min   Charges:   PT Evaluation $PT Eval Moderate Complexity: 1 Mod          Wyona Almas, PT, DPT Acute Rehabilitation Services Pager [phone] Office [phone]   Deno Etienne 06/24/2020, 11:17 AM

## 2020-06-25 ENCOUNTER — Other Ambulatory Visit: Payer: Self-pay | Admitting: Cardiology

## 2020-06-25 ENCOUNTER — Encounter (HOSPITAL_COMMUNITY): Payer: Self-pay | Admitting: Cardiovascular Disease

## 2020-06-25 DIAGNOSIS — I428 Other cardiomyopathies: Secondary | ICD-10-CM | POA: Diagnosis not present

## 2020-06-25 DIAGNOSIS — I5021 Acute systolic (congestive) heart failure: Secondary | ICD-10-CM | POA: Diagnosis not present

## 2020-06-25 DIAGNOSIS — R69 Illness, unspecified: Secondary | ICD-10-CM | POA: Diagnosis not present

## 2020-06-25 DIAGNOSIS — I480 Paroxysmal atrial fibrillation: Secondary | ICD-10-CM | POA: Diagnosis not present

## 2020-06-25 DIAGNOSIS — J449 Chronic obstructive pulmonary disease, unspecified: Secondary | ICD-10-CM | POA: Diagnosis not present

## 2020-06-25 DIAGNOSIS — E782 Mixed hyperlipidemia: Secondary | ICD-10-CM | POA: Diagnosis not present

## 2020-06-25 DIAGNOSIS — Z20822 Contact with and (suspected) exposure to covid-19: Secondary | ICD-10-CM | POA: Diagnosis not present

## 2020-06-25 DIAGNOSIS — I110 Hypertensive heart disease with heart failure: Secondary | ICD-10-CM | POA: Diagnosis not present

## 2020-06-25 DIAGNOSIS — I214 Non-ST elevation (NSTEMI) myocardial infarction: Secondary | ICD-10-CM | POA: Diagnosis not present

## 2020-06-25 DIAGNOSIS — I2510 Atherosclerotic heart disease of native coronary artery without angina pectoris: Secondary | ICD-10-CM | POA: Diagnosis not present

## 2020-06-25 HISTORY — DX: Paroxysmal atrial fibrillation: I48.0

## 2020-06-25 HISTORY — DX: Acute systolic (congestive) heart failure: I50.21

## 2020-06-25 LAB — CBC
HCT: 28.2 % — ABNORMAL LOW (ref 39.0–52.0)
Hemoglobin: 8.8 g/dL — ABNORMAL LOW (ref 13.0–17.0)
MCH: 27.1 pg (ref 26.0–34.0)
MCHC: 31.2 g/dL (ref 30.0–36.0)
MCV: 86.8 fL (ref 80.0–100.0)
Platelets: 254 10*3/uL (ref 150–400)
RBC: 3.25 MIL/uL — ABNORMAL LOW (ref 4.22–5.81)
RDW: 13.2 % (ref 11.5–15.5)
WBC: 14.3 10*3/uL — ABNORMAL HIGH (ref 4.0–10.5)
nRBC: 0 % (ref 0.0–0.2)

## 2020-06-25 LAB — BASIC METABOLIC PANEL
Anion gap: 10 (ref 5–15)
BUN: 16 mg/dL (ref 8–23)
CO2: 18 mmol/L — ABNORMAL LOW (ref 22–32)
Calcium: 8.2 mg/dL — ABNORMAL LOW (ref 8.9–10.3)
Chloride: 106 mmol/L (ref 98–111)
Creatinine, Ser: 0.92 mg/dL (ref 0.61–1.24)
GFR, Estimated: >60 mL/min (ref >60)
Glucose, Bld: 108 mg/dL — ABNORMAL HIGH (ref 70–99)
Potassium: 3.5 mmol/L (ref 3.5–5.1)
Sodium: 134 mmol/L — ABNORMAL LOW (ref 135–145)

## 2020-06-25 MED ORDER — ASPIRIN EC 81 MG PO TBEC
81.0000 mg | DELAYED_RELEASE_TABLET | Freq: Every day | ORAL | Status: DC
  Filled 2020-06-25: qty 1

## 2020-06-25 MED ORDER — FOLIC ACID 1 MG PO TABS: 1.0000 mg | ORAL_TABLET | Freq: Every day | ORAL | Status: DC

## 2020-06-25 MED ORDER — POTASSIUM CHLORIDE CRYS ER 20 MEQ PO TBCR
60.0000 meq | EXTENDED_RELEASE_TABLET | Freq: Once | ORAL | Status: AC
  Administered 2020-06-25: 60 meq via ORAL
  Filled 2020-06-25: qty 3

## 2020-06-25 MED ORDER — ASPIRIN EC 81 MG PO TBEC
81.0000 mg | DELAYED_RELEASE_TABLET | Freq: Every day | ORAL | Status: DC
Start: 2020-06-26 — End: 2020-06-25

## 2020-06-25 MED ORDER — CLOPIDOGREL BISULFATE 75 MG PO TABS: 75.0000 mg | ORAL_TABLET | Freq: Every day | ORAL | 3 refills | Status: DC

## 2020-06-25 MED ORDER — METOPROLOL TARTRATE 25 MG PO TABS: 37.5000 mg | ORAL_TABLET | Freq: Two times a day (BID) | ORAL | Status: DC

## 2020-06-25 MED ORDER — ASPIRIN 81 MG PO TBEC: 81.0000 mg | DELAYED_RELEASE_TABLET | Freq: Every day | ORAL | 3 refills | Status: DC

## 2020-06-25 MED ORDER — ATORVASTATIN CALCIUM 80 MG PO TABS: 80.0000 mg | ORAL_TABLET | Freq: Every day | ORAL | 3 refills | Status: DC

## 2020-06-25 MED ORDER — FERROUS SULFATE 325 (65 FE) MG PO TABS: 325.0000 mg | ORAL_TABLET | Freq: Every day | ORAL | 12 refills | Status: DC

## 2020-06-25 MED ORDER — METOPROLOL TARTRATE 37.5 MG PO TABS: 37.5000 mg | ORAL_TABLET | Freq: Two times a day (BID) | ORAL | 11 refills | Status: DC

## 2020-06-25 MED ORDER — VITAMIN B-12 1000 MCG PO TABS: 1000.0000 ug | ORAL_TABLET | Freq: Every day | ORAL | 0 refills | Status: DC

## 2020-06-25 MED FILL — Verapamil HCl IV Soln 2.5 MG/ML: INTRAVENOUS | Qty: 2 | Status: AC

## 2020-06-25 MED FILL — VITAMIN B-12 1000 MCG TABS: 1000 | 30 days supply | Qty: 30 | Fill #0

## 2020-06-25 MED FILL — CLOPIDOGREL 75 MG TABLET: 75 | 90 days supply | Qty: 90 | Fill #0

## 2020-06-25 MED FILL — METOPROLOL TARTRATE 25 MG T: 25 | 30 days supply | Qty: 90 | Fill #0

## 2020-06-25 MED FILL — FERROUS SULFATE 325 MG TAB: 325 (65 FE) | 30 days supply | Qty: 30 | Fill #0

## 2020-06-25 MED FILL — ASPIRIN LOW DOSE 81 MG TBEC: 81 | 90 days supply | Qty: 90 | Fill #0

## 2020-06-25 MED FILL — ATORVASTATIN CALCIUM 80 MG: 80 | 90 days supply | Qty: 90 | Fill #0

## 2020-06-25 NOTE — TOC Initial Note (Addendum)
Transition of Care (TOC) - Initial/Assessment Note    Patient Details  Name: James Commons Sr. MRN: [id number] Date of Birth: 20-Jul-1931  Transition of [facility]) CM/SW Contact:    Bethena Roys, RN Phone Number: 06/25/2020, 2:30 PM  Clinical Narrative:  Patient presented for chest pain. Prior to arrival patient was from home alone and has support of children. Patient is in need of home health services. Case Manager spoke to patient and son and they want an agency within network. Case Manager reached out to [facility] and they will not be able to accept the patient. Case Manager then called [facility] and they can accept the patient. Start of Care to begin within 24-48 hours post transition home. Daughter provided transportation via private vehicle home.                 Expected Discharge Plan: [place] Barriers to Discharge: No Barriers Identified   Patient Goals and CMS Choice Patient states their goals for this hospitalization and ongoing recovery are:: to return home CMS Medicare.gov Compare Post Acute Care list provided to:: Patient Choice offered to / list presented to : [place]  Expected Discharge Plan and Services Expected Discharge Plan: [place]   Discharge Planning Services: CM Consult Post Acute Care Choice: [place] arrangements for the past 2 months: Single Family Home Expected Discharge Date: 06/25/20               DME Arranged: Gilford Rile rolling DME Agency: [employer] Date DME Agency Contacted: 06/25/20 Time DME Agency Contacted: 1220 Representative spoke with at DME Agency: Freda Munro HH Arranged: PT [place]: [facility] ([facility] [employer]) Date [place]: 06/25/20 Time [place]: 1400 Representative spoke with at [place]: [place] Arrangements/Services Living arrangements for the past 2 months: [place] Lives with:: Self (adult children will provide 24 hr supervision) Patient language and need for interpreter reviewed:: Yes Do you feel safe going back to the place where you live?: Yes      Need for Family Participation in Patient Care: Yes (Comment) Care giver support system in place?: Yes (comment)   Criminal Activity/Legal Involvement Pertinent to Current Situation/Hospitalization: No - Comment as needed   Permission Sought/Granted Permission sought to share information with : [place] granted to share information with : Yes, Verbal Permission Granted     Permission granted to share info w AGENCY: [facility]        Emotional Assessment Appearance:: Appears stated age Attitude/Demeanor/Rapport: Engaged Affect (typically observed): Appropriate Orientation: : Oriented to Situation,Oriented to  Time,Oriented to Place,Oriented to Self Alcohol / Substance Use: Not Applicable Psych Involvement: No (comment)  Admission diagnosis:  Elevated troponin [R77.8] NSTEMI (non-ST elevated myocardial infarction) ([place]) [I21.4] Atrial fibrillation with RVR ([place]) [I48.91] Patient Active Problem List   Diagnosis Date Noted  . PAF (paroxysmal atrial fibrillation) ([place]) 06/25/2020  . Acute systolic heart failure ([place]) 06/25/2020  . NSTEMI (non-ST elevated myocardial infarction) ([place]) 06/21/2020  . Closed fracture of right olecranon process 04/10/2020  . Hyperlipemia   . History of prostate cancer   . Other fatigue 02/05/2020  . Dermatitis 02/05/2020  . Anemia 02/05/2020  . Moderate persistent asthma, uncomplicated 70/62/3762  . COPD (chronic obstructive pulmonary disease) ([place]) 01/21/2020  . Atopic dermatitis 12/05/2019  . Acquired hypothyroidism 08/15/2019  . Mixed conductive and sensorineural  hearing loss, 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  . Malnutrition of moderate degree ([place]) 08/15/2019  . Senile osteoporosis 08/15/2019  .  Nonsustained ventricular tachycardia ([place]) 01/30/2019  . Bradycardia 11/28/2018  . Ventricular ectopy 11/28/2018  . Essential hypertension 11/28/2018  . LVH (left ventricular hypertrophy) 11/28/2018  . Dizziness 02/19/2016  . Bacterial pneumonia, unspecified 06/14/2012  . Hyperlipidemia, mixed 08/21/2008  . SINUSITIS, ACUTE 06/28/2007  . Seasonal and perennial allergic rhinitis 05/17/2007   PCP:  [name], MD Pharmacy:   [id number] - [place], [place] [address] Phone: [phone] Fax: [phone]  [facility] [place] [place] - [address] [place] [address] Phone: [phone] Fax: [phone]   Readmission Risk Interventions No flowsheet data found.

## 2020-06-25 NOTE — Clinical Note (Incomplete)
CC: chest pain, suspected NSTEMI, Afib RVR (rapid ventricular response), anemia, hypokalemia   HPI: 87 yo M presented to ED with chest pain, SOB, and new onset Afib. Gave diltiazem (d/c) and started heparin for Afib. Was taking prednisone pre-admission (2/8-2/13). Pt reports poor diet, low vegetable intake. Pt is eager and excited to ambulate and go home.  PMH: NSVT, COPD, hypothyroidism, HTN, hx prostate cancer   NSTEMI/severe single-vessel LAD disease:  ASA 81mg  QD  Atorvastatin 80mg  QD  Clopidogrel 75 mg QD Metoprolol TARTRATE 25 mg BID Potassium 60 mEq x1 BP still elevated   Tachycardic HR~100bpm ECHO: EF 55-60%, no LVH Received PCI 2/16, no triple therapy due to potential bleed from fall risk  Normocytic Anemia:  RBC low Hgb low Hct low Plt WNL  Iron very low Ferritin low Folate normal B12 very low FOBT negative  Protonix 40 mg to minimize bleeding risk Feraheme infusion 510 mg weekly  Afib RVR (resolved):  Received diltiazem  Currently on heparin  Will need OAC on d/c   Hypokalemia (resolved):  3+ runs of IV K+ and 14meq PO  K 3, 3.8, 3.7   Other Labs:  !!! WBC 10.1, 13.2, 23.5 (very high, 4.5-11)  ! A1C 6.3 (pre-DM)  BG 204, 94, 97  CO2 20, 23  Ca 8.6, 8.0  Scr 0.88, 0.76, 0.75   Other Meds:  Levothyroxine 182mcg QD -- hypothyroidism (well controlled) Pantoprazole 40 mg QD PRN SA inhalers for SOB   Plan:  Daily HL/CBC  May need GI input before [place] -- hx blood in stool, none currently, f/u FOBT

## 2020-06-25 NOTE — Discharge Summary (Addendum)
Discharge Summary    Patient ID: James Brandis Sr. MRN: [id number]; DOB: 08/12/1931  Admit date: 06/21/2020 Discharge date: 06/25/2020  PCP:  [name], [place]  Cardiologist:  Quay Burow, MD  Advanced Practice Provider:  No care team member to display Electrophysiologist:  None   Discharge Diagnoses    Principal Problem:   NSTEMI (non-ST elevated myocardial infarction) [facility]) Active Problems:   Hyperlipidemia, mixed   Essential hypertension   Anemia   PAF (paroxysmal atrial fibrillation) ([place])   Acute systolic heart failure ([place])    Diagnostic Studies/Procedures      Cath: 06/22/20   Conclusions: Severe single-vessel coronary artery disease with heavily calcified mid LAD stenosis of up to 90%. Mild to moderate, ostial LMCA, proximal LAD, OM2, and proximal RCA disease. Moderately to severely reduced left ventricular systolic function with mid/apical anterior and apical hypokinesis/akinesis. Moderately elevated left ventricular filling pressure (LVEDP ~25 mmHg).   Recommendations: Images reviewed with Dr. Gwenlyn Found.  Given worsening anemia of uncertain etiology, recommend further workup of declining hemoglobin.  IV heparin will be restarted in 4 hours in the setting of ACS and PAF.  Defer adding P2Y12 inhibitor at this time. Aggressive secondary prevention. If no active bleeding is identified and hemoglobin stabilizes, recommend PCI/atherectomy to mid LAD. Optimize evidence-based heart failure therapy.   Nelva Bush, MD [facility]   Echo: 06/23/20   IMPRESSIONS     1. Left ventricular ejection fraction, by estimation, is 55 to 60%. The  left ventricle has normal function. The left ventricle has no regional  wall motion abnormalities. Left ventricular diastolic function could not  be evaluated.   2. Right ventricular systolic function is normal. The right ventricular  size is normal. There is moderately  elevated pulmonary artery systolic  pressure. The estimated right ventricular systolic pressure is 16.6 mmHg.   3. Left atrial size was mildly dilated.   4. The mitral valve is normal in structure. Trivial mitral valve  regurgitation. No evidence of mitral stenosis.   5. Tricuspid valve regurgitation is moderate.   6. The aortic valve is tricuspid. There is mild calcification of the  aortic valve. There is moderate thickening of the aortic valve. Aortic  valve regurgitation is trivial. Mild to moderate aortic valve  sclerosis/calcification is present, without any  evidence of aortic stenosis.   7. The inferior vena cava is dilated in size with >50% respiratory  variability, suggesting right atrial pressure of 8 mmHg.    Cath: 06/24/20   Ost LM lesion is 30% stenosed. Prox LAD lesion is 25% stenosed. 2nd Diag lesion is 50% stenosed. Mid LAD lesion is 90% stenosed. 2nd Mrg lesion is 35% stenosed. Post intervention, there is a 0% residual stenosis. A drug-eluting stent was successfully placed using a Harris [id number].   Successful OCT guided orbital atherectomy and drug-eluting stent placement to the mid LAD.  Overall, very difficult procedure due to heavy calcifications and difficulty expanding the stent in the proximal segment of the stent.  The stent was fully dilated with a 3.5 noncompliant balloon to high pressure.   Recommendations: I discontinued unfractionated heparin drip. The patient is already on dual antiplatelet therapy with aspirin and clopidogrel which should be continued for at least 6 months.   I do not think he is a good candidate for long-term anticoagulation considering his anemia and GI bleed.  I discussed with Dr. Gwenlyn Found. Diagnostic Dominance: Right      Intervention  _____________   History of Present Illness     James Counsell Sr. is a 87 y.o. male with history of hyperlipidemia, nonsustained VT, COPD, hypothyroidism and  hypertension who presented with new onset chest pain and A. fib RVR. Patient was extremely hard of hearing and the majority of history was obtained from son. Lives at home with his wife he reported waking up the morning of admission to sit down to eat and had sudden onset of chest pressure. Felt like someone standing on his chest. Never experienced anything like that before. States he dealt with it intermittently throughout the morning and into the afternoon but symptoms became more severe and he presented to the ED.  Hospital Course     Consultants: GI  1. NSTEMI: Reported developing chest pain the day prior to admission that was intermittent throughout the day with associated shortness of breath. hsTn rose to 3745. EKG on admission showed afib RVR with ST depression in lateral leads which persisted once he had converted to SR. Underwent cardiac cath noted above with Dr. Saunders Revel showing heavily calcified mLAD lesion, along with 60% in mRCA. He was not stented given his anemia. Seen by GI but considered high risk for procedure. This was discussed with the patient and son who agreed to PCI. Underwent successful PCI with OCT guided orbital atherectomy and DES to the mLAD. -- plan for DAPT with ASA/plavix for at least 6 months, continue statin and BB   2. New onset afib RVR: denied any specific palpitations prior to admission, suspect his shortness of breath was related to Afib.  -- given IV diltiazem IVP and converted to SR. Had some intermittent episodes of afib with sinus beats as well, but then remained in SR with PACs, PVCs -- was on IV heparin, but stopped post cath. -- CHA2DS2-VASc Score of at least 4, but with his anemia, frail state and frequent falls would not consider him a good candidate for DOAC -- continue metoprolol 25mg  BID>>increased to 37.5mg  BID   3. Anemia: Hgb 9.8>>8.2>>10.1>>9.3>>8.8. Reported having blood done recently through PCP office and asked to check stool cards.  -- denied any  obvious bleeding or dark tarry stools prior to admission. FOBT were negative -- seen by GI, but high risk for procedure with cardiac disease -- iron def, recieved IV feraheme -- low vit B12 on iron panel, started on oral Iron and vit B12 on discharge -- will need to follow up with PCP as an outpatient for ongoing management -- CBC at follow up   4. Hypokalemia: K+ 3.5, supplemented during admission -- BMET at follow up  5. HTN: controlled on current regimen -- switched to metoprolol 25mg  BID>> increased to 37.5mg  BID   6. ICM: EF reported at 35% on LV gram, but echo showed improvement in 50-55% with no rWMA noted. No signs of volume overload on exam -- tolerating low dose BB, will hold on ACE/ARB to allow for more BB therapy   7. Leukocytosis: WBC elevated at 23.5>>improve to 14.3 this morning. No fevers overnight or s/s of infection. Suspect inflammatory in response to MI.   Did the patient have an acute coronary syndrome (MI, NSTEMI, STEMI, etc) this admission?:  Yes                               AHA/ACC Clinical Performance & Quality Measures: Aspirin prescribed? - Yes ADP Receptor Inhibitor (Plavix/Clopidogrel, Brilinta/Ticagrelor or Effient/Prasugrel) prescribed (includes  medically managed patients)? - Yes Beta Blocker prescribed? - Yes High Intensity Statin (Lipitor 40-80mg  or Crestor 20-40mg ) prescribed? - Yes EF assessed during THIS hospitalization? - Yes For EF <40%, was ACEI/ARB prescribed? - Not Applicable (EF >/= 02%) For EF <40%, Aldosterone Antagonist (Spironolactone or Eplerenone) prescribed? - Not Applicable (EF >/= 58%) Cardiac Rehab Phase II ordered (including medically managed patients)? - Yes       _____________  Discharge Vitals Blood pressure (!) 138/99, pulse (!) 107, temperature 97.6 F (36.4 C), temperature source Axillary, resp. rate 19, height 6' (1.829 m), weight 61.2 kg, SpO2 97 %.  Filed Weights   06/23/20 0300 06/24/20 0412 06/25/20 0321  Weight:  60.6 kg 59 kg 61.2 kg    Labs & Radiologic Studies    CBC Recent Labs    06/24/20 0810 06/25/20 0445  WBC 13.5* 14.3*  HGB 9.3* 8.8*  HCT 28.5* 28.2*  MCV 85.1 86.8  PLT 258 527   Basic Metabolic Panel Recent Labs    06/24/20 0810 06/25/20 0445  NA 136 134*  K 3.5 3.5  CL 107 106  CO2 19* 18*  GLUCOSE 85 108*  BUN 12 16  CREATININE 0.89 0.92  CALCIUM 8.3* 8.2*   Liver Function Tests No results for input(s): AST, ALT, ALKPHOS, BILITOT, PROT, ALBUMIN in the last 72 hours. No results for input(s): LIPASE, AMYLASE in the last 72 hours. High Sensitivity Troponin:   Recent Labs  Lab 06/21/20 1906 06/21/20 2040 06/22/20 0513  TROPONINIHS 111* 276* 3,745*    BNP Invalid input(s): POCBNP D-Dimer No results for input(s): DDIMER in the last 72 hours. Hemoglobin A1C No results for input(s): HGBA1C in the last 72 hours. Fasting Lipid Panel No results for input(s): CHOL, HDL, LDLCALC, TRIG, CHOLHDL, LDLDIRECT in the last 72 hours. Thyroid Function Tests No results for input(s): TSH, T4TOTAL, T3FREE, THYROIDAB in the last 72 hours.  Invalid input(s): FREET3 _____________  DG Chest 2 View  Result Date: 06/21/2020 CLINICAL DATA:  Chest pain EXAM: CHEST - 2 VIEW COMPARISON:  July 14, 2017 FINDINGS: The heart size and mediastinal contours are within normal limits. Lungs are hyperexpanded both lungs are clear. The visualized skeletal structures are unremarkable. Aortic calcifications are noted. IMPRESSION: No active cardiopulmonary disease. Electronically Signed   By: Constance Holster M.D.   On: 06/21/2020 19:44   CARDIAC CATHETERIZATION  Result Date: 06/24/2020  Ost LM lesion is 30% stenosed.  Prox LAD lesion is 25% stenosed.  2nd Diag lesion is 50% stenosed.  Mid LAD lesion is 90% stenosed.  2nd Mrg lesion is 35% stenosed.  Post intervention, there is a 0% residual stenosis.  A drug-eluting stent was successfully placed using a Britt [id number].   Successful OCT guided orbital atherectomy and drug-eluting stent placement to the mid LAD.  Overall, very difficult procedure due to heavy calcifications and difficulty expanding the stent in the proximal segment of the stent.  The stent was fully dilated with a 3.5 noncompliant balloon to high pressure. Recommendations: I discontinued unfractionated heparin drip. The patient is already on dual antiplatelet therapy with aspirin and clopidogrel which should be continued for at least 6 months. I do not think he is a good candidate for long-term anticoagulation considering his anemia and GI bleed.  I discussed with Dr. Gwenlyn Found.   CARDIAC CATHETERIZATION  Result Date: 06/22/2020 Conclusions: 1. Severe single-vessel coronary artery disease with heavily calcified mid LAD stenosis of up to 90%. 2. Mild to moderate, ostial LMCA, proximal LAD,  OM2, and proximal RCA disease. 3. Moderately to severely reduced left ventricular systolic function with mid/apical anterior and apical hypokinesis/akinesis. 4. Moderately elevated left ventricular filling pressure (LVEDP ~25 mmHg). Recommendations: 1. Images reviewed with Dr. Gwenlyn Found.  Given worsening anemia of uncertain etiology, recommend further workup of declining hemoglobin.  IV heparin will be restarted in 4 hours in the setting of ACS and PAF.  Defer adding P2Y12 inhibitor at this time. 2. Aggressive secondary prevention. 3. If no active bleeding is identified and hemoglobin stabilizes, recommend PCI/atherectomy to mid LAD. 4. Optimize evidence-based heart failure therapy. Nelva Bush, MD [facility]   ECHOCARDIOGRAM COMPLETE  Result Date: 06/23/2020    ECHOCARDIOGRAM REPORT   Patient Name:   James Enriquez Sr. Date of Exam: 06/23/2020 Medical Rec #:  [id number]             Height:       72.0 in Accession #:    [id number]            Weight:       133.6 lb Date of Birth:  Mar 12, 1932              BSA:          1.795 m Patient Age:    34 years              BP:            141/73 mmHg Patient Gender: M                     HR:           72 bpm. Exam Location:  Inpatient Procedure: 2D Echo, Cardiac Doppler and Color Doppler Indications:    NSTEMI I21.4  History:        Patient has prior history of Echocardiogram examinations, most                 recent 01/17/2019. COPD; Risk Factors:Dyslipidemia.  Sonographer:    Bernadene Person RDCS Referring Phys: [id number] [place]  1. Left ventricular ejection fraction, by estimation, is 55 to 60%. The left ventricle has normal function. The left ventricle has no regional wall motion abnormalities. Left ventricular diastolic function could not be evaluated.  2. Right ventricular systolic function is normal. The right ventricular size is normal. There is moderately elevated pulmonary artery systolic pressure. The estimated right ventricular systolic pressure is 63.3 mmHg.  3. Left atrial size was mildly dilated.  4. The mitral valve is normal in structure. Trivial mitral valve regurgitation. No evidence of mitral stenosis.  5. Tricuspid valve regurgitation is moderate.  6. The aortic valve is tricuspid. There is mild calcification of the aortic valve. There is moderate thickening of the aortic valve. Aortic valve regurgitation is trivial. Mild to moderate aortic valve sclerosis/calcification is present, without any evidence of aortic stenosis.  7. The inferior vena cava is dilated in size with >50% respiratory variability, suggesting right atrial pressure of 8 mmHg. FINDINGS  Left Ventricle: Left ventricular ejection fraction, by estimation, is 55 to 60%. The left ventricle has normal function. The left ventricle has no regional wall motion abnormalities. The left ventricular internal cavity size was normal in size. There is  no left ventricular hypertrophy. Left ventricular diastolic function could not be evaluated due to incessant ectopy. Left ventricular diastolic function could not be evaluated. Right Ventricle: The right  ventricular size is normal. No increase in right ventricular wall thickness. Right ventricular  systolic function is normal. There is moderately elevated pulmonary artery systolic pressure. The tricuspid regurgitant velocity is 3.54 m/s, and with an assumed right atrial pressure of 8 mmHg, the estimated right ventricular systolic pressure is 32.2 mmHg. Left Atrium: Left atrial size was mildly dilated. Right Atrium: Right atrial size was normal in size. Pericardium: There is no evidence of pericardial effusion. Mitral Valve: The mitral valve is normal in structure. Trivial mitral valve regurgitation. No evidence of mitral valve stenosis. Tricuspid Valve: The tricuspid valve is normal in structure. Tricuspid valve regurgitation is moderate . No evidence of tricuspid stenosis. Aortic Valve: The aortic valve is tricuspid. There is mild calcification of the aortic valve. There is moderate thickening of the aortic valve. Aortic valve regurgitation is trivial. Aortic regurgitation PHT measures 405 msec. Mild to moderate aortic valve sclerosis/calcification is present, without any evidence of aortic stenosis. Pulmonic Valve: The pulmonic valve was normal in structure. Pulmonic valve regurgitation is not visualized. No evidence of pulmonic stenosis. Aorta: The aortic root is normal in size and structure. Venous: The inferior vena cava is dilated in size with greater than 50% respiratory variability, suggesting right atrial pressure of 8 mmHg. IAS/Shunts: No atrial level shunt detected by color flow Doppler.  LEFT VENTRICLE PLAX 2D LVIDd:         5.00 cm LVIDs:         3.50 cm LV PW:         0.90 cm LV IVS:        0.90 cm LVOT diam:     2.20 cm LV SV:         84 LV SV Index:   47 LVOT Area:     3.80 cm  RIGHT VENTRICLE TAPSE (M-mode): 2.8 cm LEFT ATRIUM             Index       RIGHT ATRIUM           Index LA diam:        3.70 cm 2.06 cm/m  RA Area:     17.70 cm LA Vol (A2C):   54.5 ml 30.37 ml/m RA Volume:   48.50 ml   27.02 ml/m LA Vol (A4C):   56.3 ml 31.37 ml/m LA Biplane Vol: 55.3 ml 30.81 ml/m  AORTIC VALVE LVOT Vmax:   104.00 cm/s LVOT Vmean:  62.700 cm/s LVOT VTI:    0.222 m AI PHT:      405 msec  AORTA Ao Root diam: 3.50 cm Ao Asc diam:  3.70 cm TRICUSPID VALVE TR Peak grad:   50.1 mmHg TR Vmax:        354.00 cm/s  SHUNTS Systemic VTI:  0.22 m Systemic Diam: 2.20 cm Dani Gobble Croitoru MD Electronically signed by Sanda Klein MD Signature Date/Time: 06/23/2020/3:28:42 PM    Final    Disposition   Pt is being discharged home today in good condition.  Follow-up Plans & Appointments     Follow-up Information     Park Liter, MD Follow up on 06/29/2020.   Specialty: Cardiology Why: at 8:20am for your follow up appt Contact information: [place] [address] [phone]         [name], MD Follow up.   Specialty: Family Medicine Why: Please follow up with MD regarding ongoing management of iron and b12 replacement Contact information: [address] [place] [address] [phone]  Discharge Instructions     (HEART FAILURE PATIENTS) Call MD:  Anytime you have any of the following symptoms: 1) 3 pound weight gain in 24 hours or 5 pounds in 1 week 2) shortness of breath, with or without a dry hacking cough 3) swelling in the hands, feet or stomach 4) if you have to sleep on extra pillows at night in order to breathe.   Complete by: As directed    AMB Referral to Cardiac Rehabilitation - Phase II   Complete by: As directed    Diagnosis:  NSTEMI Coronary Stents     After initial evaluation and assessments completed: Virtual Based Care may be provided alone or in conjunction with Phase 2 Cardiac Rehab based on patient barriers.: Yes   Call MD for:  difficulty breathing, headache or visual disturbances   Complete by: As directed    Call MD for:  persistant dizziness or light-headedness   Complete by: As directed    Call MD for:   redness, tenderness, or signs of infection (pain, swelling, redness, odor or green/yellow discharge around incision site)   Complete by: As directed    Diet - low sodium heart healthy   Complete by: As directed    Discharge instructions   Complete by: As directed    Radial Site Care Refer to this sheet in the next few weeks. These instructions provide you with information on caring for yourself after your procedure. Your caregiver may also give you more specific instructions. Your treatment has been planned according to current medical practices, but problems sometimes occur. Call your caregiver if you have any problems or questions after your procedure. HOME CARE INSTRUCTIONS You may shower the day after the procedure. Remove the bandage (dressing) and gently wash the site with plain soap and water. Gently pat the site dry.  Do not apply powder or lotion to the site.  Do not submerge the affected site in water for 3 to 5 days.  Inspect the site at least twice daily.  Do not flex or bend the affected arm for 24 hours.  No lifting over 5 pounds (2.3 kg) for 5 days after your procedure.  Do not drive home if you are discharged the same day of the procedure. Have someone else drive you.  You may drive 24 hours after the procedure unless otherwise instructed by your caregiver.  What to expect: Any bruising will usually fade within 1 to 2 weeks.  Blood that collects in the tissue (hematoma) may be painful to the touch. It should usually decrease in size and tenderness within 1 to 2 weeks.  SEEK IMMEDIATE MEDICAL CARE IF: You have unusual pain at the radial site.  You have redness, warmth, swelling, or pain at the radial site.  You have drainage (other than a small amount of blood on the dressing).  You have chills.  You have a fever or persistent symptoms for more than 72 hours.  You have a fever and your symptoms suddenly get worse.  Your arm becomes pale, cool, tingly, or numb.  You have  heavy bleeding from the site. Hold pressure on the site.   PLEASE DO NOT MISS ANY DOSES OF YOUR PLAVIX!!!!! Also keep a log of you blood pressures and bring back to your follow up appt. Please call the office with any questions.   Patients taking blood thinners should generally stay away from medicines like ibuprofen, Advil, Motrin, naproxen, and Aleve due to risk of stomach bleeding. You may  take Tylenol as directed or talk to your primary doctor about alternatives.   PLEASE ENSURE THAT YOU DO NOT RUN OUT OF YOUR PLAVIX. This medication is very important to remain on for at least one year. IF you have issues obtaining this medication due to cost please CALL the office 3-5 business days prior to running out in order to prevent missing doses of this medication.   Face-to-face encounter (required for Medicare/Medicaid patients)   Complete by: As directed    I Reino Bellis certify that this patient is under my care and that I, or a nurse practitioner or physician's assistant working with me, had a face-to-face encounter that meets the physician face-to-face encounter requirements with this patient on 06/25/2020. The encounter with the patient was in whole, or in part for the following medical condition(s) which is the primary reason for home health care (List medical condition): CAD, COPD, deconditioning   The encounter with the patient was in whole, or in part, for the following medical condition, which is the primary reason for home health care: Decondition, CAD   I certify that, based on my findings, the following services are medically necessary home health services: Physical therapy   Reason for Medically Necessary Home Health Services: Other See Comments   My clinical findings support the need for the above services: OTHER SEE COMMENTS   Further, I certify that my clinical findings support that this patient is homebound due to: Ambulates short distances less than 300 feet   Home Health    Complete by: As directed    To provide the following care/treatments: PT   Increase activity slowly   Complete by: As directed        Discharge Medications   Allergies as of 06/25/2020       Reactions   Clarithromycin    REACTION: GI upset        Medication List     STOP taking these medications    metoprolol succinate 25 MG 24 hr tablet Commonly known as: TOPROL-XL   predniSONE 10 MG tablet Commonly known as: DELTASONE       TAKE these medications    acetaminophen 325 MG tablet Commonly known as: Tylenol Take 2 tablets (650 mg total) by mouth every 6 (six) hours as needed.   albuterol 108 (90 Base) MCG/ACT inhaler Commonly known as: VENTOLIN HFA INHALE 2 PUFFS INTO THE LUNGS EVERY 6 HOURS AS NEEDED FOR WHEEZING OR SHORTNESS OF BREATH What changed: See the new instructions.   alendronate 70 MG tablet Commonly known as: FOSAMAX Take 70 mg by mouth once a week. Friday   ALLEGRA PO Take 1 tablet by mouth daily as needed (allergies).   aspirin 81 MG EC tablet Take 1 tablet (81 mg total) by mouth daily. Swallow whole.   atorvastatin 80 MG tablet Commonly known as: LIPITOR Take 1 tablet (80 mg total) by mouth daily. Start taking on: June 26, 2020   BOOST NUTRITIONAL ENERGY PO Take 237 mLs by mouth in the morning and at bedtime. Drinking 1-2 per day   clopidogrel 75 MG tablet Commonly known as: PLAVIX Take 1 tablet (75 mg total) by mouth daily. Start taking on: June 26, 2020   ferrous sulfate 325 (65 FE) MG tablet Commonly known as: FerrouSul Take 1 tablet (325 mg total) by mouth daily with breakfast.   ipratropium-albuterol 0.5-2.5 (3) MG/3ML Soln Commonly known as: DUONEB Take 3 mLs by nebulization every 4 (four) hours as needed (shortness of breath or  wheezing).   Metoprolol Tartrate 37.5 MG Tabs Take 37.5 mg by mouth 2 (two) times daily.   MULTIVITAMIN ADULT PO Take 1 tablet by mouth daily.   Synthroid 150 MCG tablet Generic drug:  levothyroxine TAKE 1 TABLET BY MOUTH DAILY BEFORE BREAKFAST. What changed: See the new instructions.   triamcinolone 0.1 % Commonly known as: KENALOG Apply 1 application topically 2 (two) times daily as needed (rash).   vitamin B-12 1000 MCG tablet Commonly known as: CYANOCOBALAMIN Take 1 tablet (1,000 mcg total) by mouth daily.         Outstanding Labs/Studies   BMET, CBC at follow-up  Duration of Discharge Encounter   Greater than 30 minutes including physician time.  Signed, Reino Bellis, NP 06/25/2020, 11:53 AM   Agree with note by Reino Bellis NP-C  Mr. Savitz was admitted with a non-STEMI.  Cardiac catheterization revealed high-grade calcified mid LAD stenosis.  There was a question of GI bleeding and anemia.  He also had PAF.  He underwent staged mid LAD orbital atherectomy, PCI drug-eluting stenting with excellent result.  His hemoglobin remained stable.  He can be discharged home today on aspirin Plavix.  I think given his risk of falling as well as his anemia it was decided not to put him on oral anticoagulant.  He will have a TOC 7 then follow-up with me in several weeks.   Lorretta Harp, M.D., [place], [facility], [place] [address] [place], [place]  [address]  [phone] 06/25/2020 12:03 PM

## 2020-06-25 NOTE — Progress Notes (Addendum)
Progress Note  Patient Name: James Kutzer Sr. Date of Encounter: 06/25/2020  [facility] Cardiologist: Quay Burow, MD   Subjective   He was again up most of the night and combative. Now has a Actuary. Better this morning. Family at the bedside  Inpatient Medications    Scheduled Meds:  aspirin  81 mg Oral Daily   atorvastatin  80 mg Oral Daily   clopidogrel  75 mg Oral Daily   levothyroxine  150 mcg Oral Q0600   melatonin  3 mg Oral QHS   metoprolol tartrate  25 mg Oral BID   pantoprazole  40 mg Oral Q0600   potassium chloride  60 mEq Oral Once   sodium chloride flush  3 mL Intravenous Q12H   sodium chloride flush  3 mL Intravenous Q12H   Continuous Infusions:  sodium chloride     sodium chloride     ferumoxytol Stopped (06/24/20 0555)   PRN Meds: sodium chloride, sodium chloride, acetaminophen, albuterol, haloperidol, ipratropium-albuterol, sodium chloride flush, sodium chloride flush   Vital Signs    Vitals:   06/24/20 1810 06/24/20 1815 06/24/20 1849 06/25/20 0321  BP:  (!) 140/103 126/90 (!) 138/99  Pulse: 85  79 (!) 107  Resp: 20 13  19   Temp:   98.1 F (36.7 C) 97.6 F (36.4 C)  TempSrc:   Oral Axillary  SpO2: 96% 96% 97%   Weight:    61.2 kg  Height:        Intake/Output Summary (Last 24 hours) at 06/25/2020 0917 Last data filed at 06/24/2020 2341 Gross per 24 hour  Intake --  Output 825 ml  Net -825 ml   Last 3 Weights 06/25/2020 06/24/2020 06/23/2020  Weight (lbs) 134 lb 14.7 oz 130 lb 1.1 oz 133 lb 9.6 oz  Weight (kg) 61.2 kg 59 kg 60.6 kg      Telemetry    SR, PACs, PVCs - Personally Reviewed  ECG    SR, PVCs LVH - Personally Reviewed  Physical Exam  Frail older WM, sitting up in the chair GEN: No acute distress.   Neck: No JVD Cardiac: RRR, no murmurs, rubs, or gallops.  Respiratory: Clear to auscultation bilaterally. GI: Soft, nontender, non-distended  MS: No edema; No deformity. Bruising to right arm into forearm, but  no hematoma Neuro:  Nonfocal  Psych: Normal affect   Labs    High Sensitivity Troponin:   Recent Labs  Lab 06/21/20 1906 06/21/20 2040 06/22/20 0513  TROPONINIHS 111* 276* 3,745*      Chemistry Recent Labs  Lab 06/23/20 0223 06/24/20 0810 06/25/20 0445  NA 134* 136 134*  K 3.7 3.5 3.5  CL 104 107 106  CO2 22 19* 18*  GLUCOSE 97 85 108*  BUN 13 12 16   CREATININE 0.75 0.89 0.92  CALCIUM 8.5* 8.3* 8.2*  GFRNONAA >60 >60 >60  ANIONGAP 8 10 10      Hematology Recent Labs  Lab 06/23/20 0223 06/24/20 0810 06/25/20 0445  WBC 23.5* 13.5* 14.3*  RBC 3.62*  3.59* 3.35* 3.25*  HGB 10.1* 9.3* 8.8*  HCT 31.5* 28.5* 28.2*  MCV 87.0 85.1 86.8  MCH 27.9 27.8 27.1  MCHC 32.1 32.6 31.2  RDW 13.3 13.3 13.2  PLT 351 258 254    BNPNo results for input(s): BNP, PROBNP in the last 168 hours.   DDimer No results for input(s): DDIMER in the last 168 hours.   Radiology    CARDIAC CATHETERIZATION  Result Date: 06/24/2020  Ost LM lesion is 30% stenosed.  Prox LAD lesion is 25% stenosed.  2nd Diag lesion is 50% stenosed.  Mid LAD lesion is 90% stenosed.  2nd Mrg lesion is 35% stenosed.  Post intervention, there is a 0% residual stenosis.  A drug-eluting stent was successfully placed using a [place] [id number].  Successful OCT guided orbital atherectomy and drug-eluting stent placement to the mid LAD.  Overall, very difficult procedure due to heavy calcifications and difficulty expanding the stent in the proximal segment of the stent.  The stent was fully dilated with a 3.5 noncompliant balloon to high pressure. Recommendations: I discontinued unfractionated heparin drip. The patient is already on dual antiplatelet therapy with aspirin and clopidogrel which should be continued for at least 6 months. I do not think he is a good candidate for long-term anticoagulation considering his anemia and GI bleed.  I discussed with Dr. Gwenlyn Found.   ECHOCARDIOGRAM COMPLETE  Result Date:  06/23/2020    ECHOCARDIOGRAM REPORT   Patient Name:   James Schwager Sr. Date of Exam: 06/23/2020 Medical Rec #:  [id number]             Height:       72.0 in Accession #:    [id number]            Weight:       133.6 lb Date of Birth:  1932-04-20              BSA:          1.795 m Patient Age:    87 years              BP:           141/73 mmHg Patient Gender: M                     HR:           72 bpm. Exam Location:  Inpatient Procedure: 2D Echo, Cardiac Doppler and Color Doppler Indications:    NSTEMI I21.4  History:        Patient has prior history of Echocardiogram examinations, most                 recent 01/17/2019. COPD; Risk Factors:Dyslipidemia.  Sonographer:    Bernadene Person RDCS Referring Phys: [id number] [place]  1. Left ventricular ejection fraction, by estimation, is 55 to 60%. The left ventricle has normal function. The left ventricle has no regional wall motion abnormalities. Left ventricular diastolic function could not be evaluated.  2. Right ventricular systolic function is normal. The right ventricular size is normal. There is moderately elevated pulmonary artery systolic pressure. The estimated right ventricular systolic pressure is 67.6 mmHg.  3. Left atrial size was mildly dilated.  4. The mitral valve is normal in structure. Trivial mitral valve regurgitation. No evidence of mitral stenosis.  5. Tricuspid valve regurgitation is moderate.  6. The aortic valve is tricuspid. There is mild calcification of the aortic valve. There is moderate thickening of the aortic valve. Aortic valve regurgitation is trivial. Mild to moderate aortic valve sclerosis/calcification is present, without any evidence of aortic stenosis.  7. The inferior vena cava is dilated in size with >50% respiratory variability, suggesting right atrial pressure of 8 mmHg. FINDINGS  Left Ventricle: Left ventricular ejection fraction, by estimation, is 55 to 60%. The left ventricle has normal function. The left  ventricle has no regional wall motion abnormalities. The left  ventricular internal cavity size was normal in size. There is  no left ventricular hypertrophy. Left ventricular diastolic function could not be evaluated due to incessant ectopy. Left ventricular diastolic function could not be evaluated. Right Ventricle: The right ventricular size is normal. No increase in right ventricular wall thickness. Right ventricular systolic function is normal. There is moderately elevated pulmonary artery systolic pressure. The tricuspid regurgitant velocity is 3.54 m/s, and with an assumed right atrial pressure of 8 mmHg, the estimated right ventricular systolic pressure is 16.1 mmHg. Left Atrium: Left atrial size was mildly dilated. Right Atrium: Right atrial size was normal in size. Pericardium: There is no evidence of pericardial effusion. Mitral Valve: The mitral valve is normal in structure. Trivial mitral valve regurgitation. No evidence of mitral valve stenosis. Tricuspid Valve: The tricuspid valve is normal in structure. Tricuspid valve regurgitation is moderate . No evidence of tricuspid stenosis. Aortic Valve: The aortic valve is tricuspid. There is mild calcification of the aortic valve. There is moderate thickening of the aortic valve. Aortic valve regurgitation is trivial. Aortic regurgitation PHT measures 405 msec. Mild to moderate aortic valve sclerosis/calcification is present, without any evidence of aortic stenosis. Pulmonic Valve: The pulmonic valve was normal in structure. Pulmonic valve regurgitation is not visualized. No evidence of pulmonic stenosis. Aorta: The aortic root is normal in size and structure. Venous: The inferior vena cava is dilated in size with greater than 50% respiratory variability, suggesting right atrial pressure of 8 mmHg. IAS/Shunts: No atrial level shunt detected by color flow Doppler.  LEFT VENTRICLE PLAX 2D LVIDd:         5.00 cm LVIDs:         3.50 cm LV PW:         0.90 cm LV  IVS:        0.90 cm LVOT diam:     2.20 cm LV SV:         84 LV SV Index:   47 LVOT Area:     3.80 cm  RIGHT VENTRICLE TAPSE (M-mode): 2.8 cm LEFT ATRIUM             Index       RIGHT ATRIUM           Index LA diam:        3.70 cm 2.06 cm/m  RA Area:     17.70 cm LA Vol (A2C):   54.5 ml 30.37 ml/m RA Volume:   48.50 ml  27.02 ml/m LA Vol (A4C):   56.3 ml 31.37 ml/m LA Biplane Vol: 55.3 ml 30.81 ml/m  AORTIC VALVE LVOT Vmax:   104.00 cm/s LVOT Vmean:  62.700 cm/s LVOT VTI:    0.222 m AI PHT:      405 msec  AORTA Ao Root diam: 3.50 cm Ao Asc diam:  3.70 cm TRICUSPID VALVE TR Peak grad:   50.1 mmHg TR Vmax:        354.00 cm/s  SHUNTS Systemic VTI:  0.22 m Systemic Diam: 2.20 cm Sanda Klein MD Electronically signed by Sanda Klein MD Signature Date/Time: 06/23/2020/3:28:42 PM    Final     Cardiac Studies   Cath: 06/22/20  Conclusions: Severe single-vessel coronary artery disease with heavily calcified mid LAD stenosis of up to 90%. Mild to moderate, ostial LMCA, proximal LAD, OM2, and proximal RCA disease. Moderately to severely reduced left ventricular systolic function with mid/apical anterior and apical hypokinesis/akinesis. Moderately elevated left ventricular filling pressure (LVEDP ~25 mmHg).  Recommendations: Images reviewed with Dr. Gwenlyn Found.  Given worsening anemia of uncertain etiology, recommend further workup of declining hemoglobin.  IV heparin will be restarted in 4 hours in the setting of ACS and PAF.  Defer adding P2Y12 inhibitor at this time. Aggressive secondary prevention. If no active bleeding is identified and hemoglobin stabilizes, recommend PCI/atherectomy to mid LAD. Optimize evidence-based heart failure therapy.   Nelva Bush, MD [facility]  Echo: 06/23/20  IMPRESSIONS     1. Left ventricular ejection fraction, by estimation, is 55 to 60%. The  left ventricle has normal function. The left ventricle has no regional  wall motion abnormalities. Left  ventricular diastolic function could not  be evaluated.   2. Right ventricular systolic function is normal. The right ventricular  size is normal. There is moderately elevated pulmonary artery systolic  pressure. The estimated right ventricular systolic pressure is 00.1 mmHg.   3. Left atrial size was mildly dilated.   4. The mitral valve is normal in structure. Trivial mitral valve  regurgitation. No evidence of mitral stenosis.   5. Tricuspid valve regurgitation is moderate.   6. The aortic valve is tricuspid. There is mild calcification of the  aortic valve. There is moderate thickening of the aortic valve. Aortic  valve regurgitation is trivial. Mild to moderate aortic valve  sclerosis/calcification is present, without any  evidence of aortic stenosis.   7. The inferior vena cava is dilated in size with >50% respiratory  variability, suggesting right atrial pressure of 8 mmHg.   Cath: 06/24/20  Ost LM lesion is 30% stenosed. Prox LAD lesion is 25% stenosed. 2nd Diag lesion is 50% stenosed. Mid LAD lesion is 90% stenosed. 2nd Mrg lesion is 35% stenosed. Post intervention, there is a 0% residual stenosis. A drug-eluting stent was successfully placed using a [place] [id number].   Successful OCT guided orbital atherectomy and drug-eluting stent placement to the mid LAD.  Overall, very difficult procedure due to heavy calcifications and difficulty expanding the stent in the proximal segment of the stent.  The stent was fully dilated with a 3.5 noncompliant balloon to high pressure.   Recommendations: I discontinued unfractionated heparin drip. The patient is already on dual antiplatelet therapy with aspirin and clopidogrel which should be continued for at least 6 months.   I do not think he is a good candidate for long-term anticoagulation considering his anemia and GI bleed.  I discussed with Dr. Gwenlyn Found. Diagnostic Dominance: Right    Intervention      Patient  Profile     87 y.o. male with PMH of NSVT, COPD, Hypothyroidism, and HTN who presented with chest pain and new onset Afib/NSTEMI.  Assessment & Plan    1. NSTEMI: Reports developing chest pain yesterday that was intermittent throughout the day with associated shortness of breath. hsTn rose to 3745. EKG on admission showed afib RVR with ST depression in lateral leads which persisted once he had converted to SR. Underwent cardiac cath noted above with Dr. Saunders Revel showing heavily calcified mLAD lesion, along with 60% in mRCA. He was not stented given his anemia. Seen by GI but considered high risk for procedure. This was discussed with the patient and son who agreed to PCI. Underwent successful PCI with OCT guided orbital atherectomy and DES to the mLAD. -- plan for DAPT with ASA/plavix for at least 6 months, continue statin and BB   2. New onset afib RVR: denies any specific palpitations prior to admission, suspect his shortness of  breath was related to Afib.  -- given IV diltiazem IVP and converted to SR. Having intermittent episodes of afib with sinus beats as well, but now appears to be SR with PACs, PVCs -- was on IV heparin, but stopped post cath. -- CHA2DS2-VASc Score of at least 4, but with his anemia, frail state and frequent falls would not consider him a good candidate for DOAC -- continue metoprolol 25mg  BID>>increase to 37.5mg  BID   3. Anemia: Hgb 9.8>>8.2>>10.1>>9.3>>8.8 this morning. Reports having blood done recently through PCP office and asked to check stool cards.  -- denies any obvious bleeding or dark tarry stools prior to admission. FOBT were negative -- seen by GI, but high risk for procedure with cardiac disease -- iron def, recieved IV feraheme -- low vit B12 on iron panel   4. Hypokalemia: K+ 3.5 this morning, will continue to suppl  -- check mag -- daily BMET   5. HTN: controlled on current regimen -- switched to metoprolol 25mg  BID>> increased to 37.5mg  BID   6. ICM:  EF reported at 35% on LV gram, but echo showed improvement in 50-55% with no rWMA noted. No signs of volume overload on exam -- tolerating low dose BB, will hold on ACE/ARB to allow for more BB therapy   7. Leukocytosis: WBC elevated at 23.5>>improve to 14.3 this morning. No fevers overnight or s/s of infection. Suspect inflammatory in response to MI?  -- will follow for now   PT following for dispo with recommendations for [facility].   For questions or updates, please contact [place] Please consult [email or web address] for contact info under        Signed, Reino Bellis, NP  06/25/2020, 9:17 AM    Agree with note by Reino Bellis NP-C  Mr. Dunnaway had successful OCT guided mid LAD orbital atherectomy, PCI drug-eluting stent using a 38 mm stent.  This was done radially by Dr. Fletcher Anon.  He had an excellent result.  He said no chest pain.  His hemoglobin is stable in the high 8 range.  After careful consideration it was elected that he would not be started on an oral anticoagulant for stroke prophylaxis given his low hemoglobin and his fall risk.  This was discussed with Dr. Fletcher Anon as well.  He does have sundowning and is combative at night in the hospital.  He stable for discharge home, TOC 7 in the return office visit with me.  Lorretta Harp, M.D., [place], [facility], [place] [address] [place], [place]  [address]  [phone] 06/25/2020 10:09 AM

## 2020-06-25 NOTE — Progress Notes (Signed)
All discharge information provided to patient and patient's daughter at bedside. No further questions ask.  IV removed. Patient provided with rollator to discharge home with Home Health PT.

## 2020-06-25 NOTE — Progress Notes (Addendum)
CARDIAC REHAB PHASE I   PRE:  Rate/Rhythm: 93 SR with PVCs/PACs    BP: lying 113/70    SaO2:   MODE:  Ambulation: 200 ft   POST:  Rate/Rhythm: 115 ST with PVCs/PACs    BP: sitting 132/72     SaO2:   Pt pleasantly confused, sitter present. Pt excited to ambulate and followed commands well. He was able to wait on me to adjust RW and stand when I asked him to. He ambulated with min assist with gait belt support and RW. Pt scissors feet at times, esp on turns. HR up but continued to have p waves. No c/o while walking. Pt to recliner. Had to d/c RW in room due to lack of space getting around bed. Pt very unsteady without RW and needed max support with gait belt. He is high fall risk for home and family will need to be with him at all times. Will need RW. Will give family gait belt from fall bag. Gave pt MI book. He is unable to retain education. He is n/a for CRPII at this time but referral is in for [place]. Left in recliner with blinds open, sitter present and engaged. [place], ACSM 06/25/2020 8:53 AM

## 2020-06-26 DIAGNOSIS — C801 Malignant (primary) neoplasm, unspecified: Secondary | ICD-10-CM | POA: Insufficient documentation

## 2020-06-26 DIAGNOSIS — S42401A Unspecified fracture of lower end of right humerus, initial encounter for closed fracture: Secondary | ICD-10-CM | POA: Insufficient documentation

## 2020-06-29 ENCOUNTER — Encounter: Payer: Self-pay | Admitting: Cardiology

## 2020-06-29 ENCOUNTER — Other Ambulatory Visit: Payer: Self-pay

## 2020-06-29 ENCOUNTER — Ambulatory Visit: Payer: Medicare HMO | Admitting: Cardiology

## 2020-06-29 VITALS — BP 118/64 | HR 72 | Ht 72.0 in | Wt 135.0 lb

## 2020-06-29 DIAGNOSIS — I480 Paroxysmal atrial fibrillation: Secondary | ICD-10-CM | POA: Diagnosis not present

## 2020-06-29 DIAGNOSIS — I5021 Acute systolic (congestive) heart failure: Secondary | ICD-10-CM | POA: Diagnosis not present

## 2020-06-29 DIAGNOSIS — E782 Mixed hyperlipidemia: Secondary | ICD-10-CM | POA: Diagnosis not present

## 2020-06-29 DIAGNOSIS — I214 Non-ST elevation (NSTEMI) myocardial infarction: Secondary | ICD-10-CM

## 2020-06-29 DIAGNOSIS — I2510 Atherosclerotic heart disease of native coronary artery without angina pectoris: Secondary | ICD-10-CM | POA: Diagnosis not present

## 2020-06-29 DIAGNOSIS — H9193 Unspecified hearing loss, bilateral: Secondary | ICD-10-CM | POA: Diagnosis not present

## 2020-06-29 DIAGNOSIS — Z7902 Long term (current) use of antithrombotics/antiplatelets: Secondary | ICD-10-CM | POA: Diagnosis not present

## 2020-06-29 DIAGNOSIS — E039 Hypothyroidism, unspecified: Secondary | ICD-10-CM | POA: Diagnosis not present

## 2020-06-29 DIAGNOSIS — Z7982 Long term (current) use of aspirin: Secondary | ICD-10-CM | POA: Diagnosis not present

## 2020-06-29 DIAGNOSIS — J449 Chronic obstructive pulmonary disease, unspecified: Secondary | ICD-10-CM | POA: Diagnosis not present

## 2020-06-29 DIAGNOSIS — I472 Ventricular tachycardia: Secondary | ICD-10-CM | POA: Diagnosis not present

## 2020-06-29 DIAGNOSIS — D649 Anemia, unspecified: Secondary | ICD-10-CM | POA: Diagnosis not present

## 2020-06-29 DIAGNOSIS — I493 Ventricular premature depolarization: Secondary | ICD-10-CM

## 2020-06-29 DIAGNOSIS — I110 Hypertensive heart disease with heart failure: Secondary | ICD-10-CM | POA: Diagnosis not present

## 2020-06-29 DIAGNOSIS — Z955 Presence of coronary angioplasty implant and graft: Secondary | ICD-10-CM | POA: Diagnosis not present

## 2020-06-29 DIAGNOSIS — I255 Ischemic cardiomyopathy: Secondary | ICD-10-CM | POA: Diagnosis not present

## 2020-06-29 DIAGNOSIS — D72829 Elevated white blood cell count, unspecified: Secondary | ICD-10-CM | POA: Diagnosis not present

## 2020-06-29 DIAGNOSIS — I10 Essential (primary) hypertension: Secondary | ICD-10-CM | POA: Diagnosis not present

## 2020-06-29 NOTE — Progress Notes (Signed)
Cardiology Office Note:    Date:  06/29/2020   ID:  James Iha Sr., DOB [birth year], MRN [id number]  PCP:  [name], MD  Cardiologist:  [name], MD    Referring MD: [name],*   Chief Complaint  Patient presents with  . follow heart attack 06/21/2020  . seen at [facility]    History of Present Illness:    James Daywalt Sr. is a 87 y.o. male who was initially referred to [place] because of sinus bradycardia, nonsustained ventricular tachycardia.  Work-up however has been negative that included stress test done more than a year ago.  Recently he ended up having some chest pain he went to [facility] he was find to be in atrial fibrillation with fast ventricular rate he also got some spell of troponin.  After that cardiac catheterization has been done which showed critical more than 90% lesion in the mid LAD.  Eventually he did have staged procedures to open up the artery he required Rotablator and of this lesion and stenting.  He comes today to my office for follow-up after that.  He was also found to be in atrial fibrillation he was put on Cardizem and converted spontaneously, he is not anticoagulated. Overall he is doing well denies have any chest pain tightness squeezing pressure burning chest.  Denies have any palpitation feeling good and he is very optimistic about this entire scenario.  Past Medical History:  Diagnosis Date  . Acquired hypothyroidism 08/15/2019  . Acute systolic heart failure ([place]) 06/25/2020  . Anemia 02/05/2020   boarderline anemic  . Atopic dermatitis 12/05/2019  . Bacterial pneumonia, unspecified 06/14/2012   06/14/2012 CXR w/ RML PNA >Zithromax and Rocephin    . Bradycardia 11/28/2018  . Cancer [facility])    prostate cancer-seed implant  . Closed fracture dislocation of right elbow   . Closed fracture of right olecranon process 04/10/2020  . COPD (chronic obstructive pulmonary disease) ([place]) 01/21/2020  . Dermatitis 02/05/2020  .  Dizziness 02/19/2016  . Essential hypertension 11/28/2018  . History of prostate cancer    seed implant  . Hyperlipemia   . Hyperlipidemia, mixed 08/21/2008   Qualifier: Diagnosis of  By: Annamaria Boots MD, Clinton D   . LVH (left ventricular hypertrophy) 11/28/2018  . Malnutrition of moderate degree ([place]) 08/15/2019  . Mixed conductive and sensorineural hearing loss, 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  . Moderate persistent asthma, uncomplicated 07/30/5571  . Nonsustained ventricular tachycardia ([place]) 01/30/2019  . NSTEMI (non-ST elevated myocardial infarction) ([place]) 06/21/2020  . Other fatigue 02/05/2020  . PAF (paroxysmal atrial fibrillation) ([place]) 06/25/2020  . Seasonal and perennial allergic rhinitis 05/17/2007      . Senile osteoporosis 08/15/2019  . SINUSITIS, ACUTE 06/28/2007   Qualifier: Diagnosis of  By: Royal Piedra NP, Tammy    . Ventricular ectopy 11/28/2018    Past Surgical History:  Procedure Laterality Date  . APPENDECTOMY    . CORONARY ATHERECTOMY N/A 06/24/2020   Procedure: CORONARY ATHERECTOMY;  Surgeon: Wellington Hampshire, MD;  Location: [place] [facility];  Service: Cardiovascular;  Laterality: N/A;  . INTRAVASCULAR ULTRASOUND/IVUS N/A 06/24/2020   Procedure: Intravascular Ultrasound/IVUS;  Surgeon: Wellington Hampshire, MD;  Location: [place] [facility];  Service: Cardiovascular;  Laterality: N/A;  . LEFT HEART CATH AND CORONARY ANGIOGRAPHY N/A 06/22/2020   Procedure: LEFT HEART CATH AND CORONARY ANGIOGRAPHY;  Surgeon: Nelva Bush, MD;  Location: [place] [facility];  Service: Cardiovascular;  Laterality: N/A;  . ORIF ELBOW FRACTURE Right 04/10/2020  Procedure: OPEN REDUCTION INTERNAL FIXATION (ORIF) ELBOW/OLECRANON FRACTURE;  Surgeon: Marchia Bond, MD;  Location: [place];  Service: Orthopedics;  Laterality: Right;    Current Medications: Current Meds  Medication Sig  . acetaminophen (TYLENOL) 325 MG tablet Take 2 tablets (650 mg total) by mouth every 6 (six) hours as needed.  Marland Kitchen  albuterol (VENTOLIN HFA) 108 (90 Base) MCG/ACT inhaler INHALE 2 PUFFS INTO THE LUNGS EVERY 6 HOURS AS NEEDED FOR WHEEZING OR SHORTNESS OF BREATH (Patient taking differently: Inhale 2 puffs into the lungs every 6 (six) hours as needed for wheezing or shortness of breath. INHALE 2 PUFFS INTO THE LUNGS EVERY 6 HOURS AS NEEDED FOR WHEEZING OR SHORTNESS OF BREATH)  . alendronate (FOSAMAX) 70 MG tablet Take 70 mg by mouth once a week. Friday  . aspirin EC 81 MG EC tablet Take 1 tablet (81 mg total) by mouth daily. Swallow whole.  Marland Kitchen atorvastatin (LIPITOR) 80 MG tablet Take 1 tablet (80 mg total) by mouth daily.  . clopidogrel (PLAVIX) 75 MG tablet Take 1 tablet (75 mg total) by mouth daily.  . ferrous sulfate (FERROUSUL) 325 (65 FE) MG tablet Take 1 tablet (325 mg total) by mouth daily with breakfast.  . ipratropium-albuterol (DUONEB) 0.5-2.5 (3) MG/3ML SOLN Take 3 mLs by nebulization every 4 (four) hours as needed (shortness of breath or wheezing).  . Metoprolol Tartrate 37.5 MG TABS Take 37.5 mg by mouth 2 (two) times daily.  . Multiple Vitamins-Minerals (MULTIVITAMIN ADULT PO) Take 1 tablet by mouth daily.   . Nutritional Supplements (BOOST NUTRITIONAL ENERGY PO) Take 237 mLs by mouth in the morning and at bedtime. Drinking 1-2 per day  . SYNTHROID 150 MCG tablet TAKE 1 TABLET BY MOUTH DAILY BEFORE BREAKFAST. (Patient taking differently: Take 150 mcg by mouth daily before breakfast.)  . triamcinolone (KENALOG) 0.1 % Apply 1 application topically 2 (two) times daily as needed (rash).  . vitamin B-12 (CYANOCOBALAMIN) 1000 MCG tablet Take 1 tablet (1,000 mcg total) by mouth daily.  . [DISCONTINUED] metoprolol tartrate (LOPRESSOR) 25 MG tablet Take 37.5 mg by mouth 2 (two) times daily.     Allergies:   Clarithromycin   Social History   Socioeconomic History  . Marital status: Married    Spouse name: Not on file  . Number of children: Not on file  . Years of education: Not on file  . Highest  education level: Not on file  Occupational History  . Occupation: Retired    Fish farm manager: OTHER    Comment: Scientist, research (life sciences)  Tobacco Use  . Smoking status: Former Smoker    Types: Cigarettes    Quit date: 1960    Years since quitting: 62.1  . Smokeless tobacco: Former Systems developer    Types: [place] date: 2011  Vaping Use  . Vaping Use: Never used  Substance and Sexual Activity  . Alcohol use: No  . Drug use: No  . Sexual activity: Not Currently  Other Topics Concern  . Not on file  Social History Narrative  . Not on file   Social Determinants of Health   Financial Resource Strain: Not on file  Food Insecurity: Not on file  Transportation Needs: No Transportation Needs  . Lack of Transportation (Medical): No  . Lack of Transportation (Non-Medical): No  Physical Activity: Sufficiently Active  . Days of Exercise per Week: 6 days  . Minutes of Exercise per Session: 30 min  Stress: Not on file  Social Connections: Not  on file     Family History: The patient's family history includes Alzheimer's disease in his father; Diabetes in his mother. ROS:   Please see the history of present illness.    All 14 point review of systems negative except as described per history of present illness  EKGs/Labs/Other Studies Reviewed:      Recent Labs: 06/16/2020: ALT 13; TSH 1.360 06/25/2020: BUN 16; Creatinine, Ser 0.92; Hemoglobin 8.8; Platelets 254; Potassium 3.5; Sodium 134  Recent Lipid Panel    Component Value Date/Time   CHOL 206 (H) 06/16/2020 1105   TRIG 113 06/16/2020 1105   HDL 49 06/16/2020 1105   CHOLHDL 4.2 06/16/2020 1105   LDLCALC 137 (H) 06/16/2020 1105    Physical Exam:    VS:  BP 118/64 (BP Location: Left Arm, Patient Position: Sitting)   Pulse 72   Ht 6' (1.829 m)   Wt 135 lb (61.2 kg)   SpO2 95%   BMI 18.31 kg/m     Wt Readings from Last 3 Encounters:  06/29/20 135 lb (61.2 kg)  06/25/20 134 lb 14.7 oz (61.2 kg)  06/16/20 131 lb 6.4 oz (59.6 kg)      GEN:  Well nourished, well developed in no acute distress HEENT: Normal NECK: No JVD; No carotid bruits LYMPHATICS: No lymphadenopathy CARDIAC: RRR, no murmurs, no rubs, no gallops RESPIRATORY:  Clear to auscultation without rales, wheezing or rhonchi  ABDOMEN: Soft, non-tender, non-distended MUSCULOSKELETAL:  No edema; No deformity  SKIN: Warm and dry LOWER EXTREMITIES: no swelling NEUROLOGIC:  Alert and oriented x 3 PSYCHIATRIC:  Normal affect   ASSESSMENT:    1. NSTEMI (non-ST elevated myocardial infarction) ([place])   2. PAF (paroxysmal atrial fibrillation) ([place])   3. Ventricular ectopy   4. Mixed hyperlipidemia    PLAN:    In order of problems listed above:  1. History of non-STEMI.  Doing well from that point review likely his left ventricle ejection fraction is preserved.  He is on beta-blocker which I will continue, he is not on ACE inhibitor, ARB I suspect that is because of kidney dysfunction.  I will check his kidney function today.  He is on dual antiplatelets therapy and I explained to him as well as to his daughter reason for taking those 2 medications.  We will check CBC today since there was an issue with him being anemic. 2. Paroxysmal atrial fibrillation with chads 2 vascular even calling for however he is fragile he got falls he does have multiple bruises decision was made not to pursue anticoagulation therapy at this stage.  I will check his CBC today. 3. Ventricular ectopy.  Beta-blocker has been increased doing well from that point of view. 4. Mixed dyslipidemia: He is on Lipitor 80 which I will continue next times when I see him which will be in about 6 weeks I will recheck his fasting lipid profile.  I did review record from [facility] for this visit.   Medication Adjustments/Labs and Tests Ordered: Current medicines are reviewed at length with the patient today.  Concerns regarding medicines are outlined above.  No orders of the defined types were placed in  this encounter.  Medication changes: No orders of the defined types were placed in this encounter.   Signed, Park Liter, MD, [facility] 06/29/2020 9:10 AM    [place]

## 2020-06-29 NOTE — Addendum Note (Signed)
Addended by: Senaida Ores on: 06/29/2020 09:26 AM   Modules accepted: Orders

## 2020-06-29 NOTE — Patient Instructions (Signed)
Medication Instructions:  Your physician recommends that you continue on your current medications as directed. Please refer to the Current Medication list given to you today.  *If you need a refill on your cardiac medications before your next appointment, please call your pharmacy*   Lab Work: Your physician recommends that you return for lab work today: cbc, bmp   If you have labs (blood work) drawn today and your tests are completely normal, you will receive your results only by: Marland Kitchen MyChart Message (if you have MyChart) OR . A paper copy in the mail If you have any lab test that is abnormal or we need to change your treatment, we will call you to review the results.   Testing/Procedures: None   Follow-Up: At [facility], you and your health needs are our priority.  As part of our continuing mission to provide you with exceptional heart care, we have created designated Provider Care Teams.  These Care Teams include your primary Cardiologist (physician) and Advanced Practice Providers (APPs -  Physician Assistants and Nurse Practitioners) who all work together to provide you with the care you need, when you need it.  We recommend signing up for the patient portal called "MyChart".  Sign up information is provided on this After Visit Summary.  MyChart is used to connect with patients for Virtual Visits (Telemedicine).  Patients are able to view lab/test results, encounter notes, upcoming appointments, etc.  Non-urgent messages can be sent to your provider as well.   To learn more about what you can do with MyChart, go to [email or web address].    Your next appointment:   6 week(s)  The format for your next appointment:   In Person  Provider:   [name], MD   Other Instructions

## 2020-06-30 ENCOUNTER — Telehealth: Payer: Self-pay

## 2020-06-30 LAB — CBC
Hematocrit: 29.6 % — ABNORMAL LOW (ref 37.5–51.0)
Hemoglobin: 9.3 g/dL — ABNORMAL LOW (ref 13.0–17.7)
MCH: 26.7 pg (ref 26.6–33.0)
MCHC: 31.4 g/dL — ABNORMAL LOW (ref 31.5–35.7)
MCV: 85 fL (ref 79–97)
Platelets: 314 10*3/uL (ref 150–450)
RBC: 3.48 x10E6/uL — ABNORMAL LOW (ref 4.14–5.80)
RDW: 13.6 % (ref 11.6–15.4)
WBC: 15.9 10*3/uL — ABNORMAL HIGH (ref 3.4–10.8)

## 2020-06-30 LAB — BASIC METABOLIC PANEL
BUN/Creatinine Ratio: 14 (ref 10–24)
BUN: 12 mg/dL (ref 8–27)
CO2: 21 mmol/L (ref 20–29)
Calcium: 8.9 mg/dL (ref 8.6–10.2)
Chloride: 99 mmol/L (ref 96–106)
Creatinine, Ser: 0.88 mg/dL (ref 0.76–1.27)
GFR calc Af Amer: 90 mL/min/{1.73_m2} (ref >59)
GFR calc non Af Amer: 78 mL/min/{1.73_m2} (ref >59)
Glucose: 82 mg/dL (ref 65–99)
Potassium: 4.9 mmol/L (ref 3.5–5.2)
Sodium: 134 mmol/L (ref 134–144)

## 2020-06-30 NOTE — Telephone Encounter (Signed)
Elta Guadeloupe, PT with [facility], left message requesting orders for PT. Schedule: two times a week for three weeks and 1 time a week for two weeks.   Attempted to call Elta Guadeloupe back but no answer. Left detailed message with verbal ok to these orders for pt. Elta Guadeloupe is to call back.   Royce Macadamia, [place] 06/30/20 8:59 AM

## 2020-07-02 ENCOUNTER — Telehealth (HOSPITAL_COMMUNITY): Payer: Self-pay | Admitting: Pharmacist

## 2020-07-02 NOTE — Telephone Encounter (Signed)
Transitions of Care Pharmacy   Call attempted for a pharmacy transitions of care follow-up. Pt is not available per family member.  Call attempt #1. Will follow-up in 2-3 days.   Garnet Sierras, PharmD

## 2020-07-03 ENCOUNTER — Telehealth: Payer: Self-pay | Admitting: Cardiology

## 2020-07-03 ENCOUNTER — Telehealth: Payer: Self-pay

## 2020-07-03 NOTE — Telephone Encounter (Signed)
Returned [employer] call and left a message.

## 2020-07-03 NOTE — Telephone Encounter (Signed)
-----   Message from Park Liter, MD sent at 07/03/2020 10:08 AM EST ----- Anemia improved to previous level.  Rest of the test normal

## 2020-07-03 NOTE — Telephone Encounter (Signed)
    Pt's daughter returning Hayley's call to get pt's lab result, [name] ask if Angie Fava can call her after 4:30 so she will be available to answer her call

## 2020-07-03 NOTE — Progress Notes (Signed)
Chronic Care Management Pharmacy Assistant   Name: James Friesen Sr.  MRN: [id number] DOB: 07-29-1931  Reason for Encounter: Adherence  PCP : [name], MD  Allergies:   Allergies  Allergen Reactions  . Clarithromycin     REACTION: GI upset    Medications: Outpatient Encounter Medications as of 07/03/2020  Medication Sig  . acetaminophen (TYLENOL) 325 MG tablet Take 2 tablets (650 mg total) by mouth every 6 (six) hours as needed.  Marland Kitchen albuterol (VENTOLIN HFA) 108 (90 Base) MCG/ACT inhaler INHALE 2 PUFFS INTO THE LUNGS EVERY 6 HOURS AS NEEDED FOR WHEEZING OR SHORTNESS OF BREATH (Patient taking differently: Inhale 2 puffs into the lungs every 6 (six) hours as needed for wheezing or shortness of breath. INHALE 2 PUFFS INTO THE LUNGS EVERY 6 HOURS AS NEEDED FOR WHEEZING OR SHORTNESS OF BREATH)  . alendronate (FOSAMAX) 70 MG tablet Take 70 mg by mouth once a week. Friday  . aspirin EC 81 MG EC tablet Take 1 tablet (81 mg total) by mouth daily. Swallow whole.  Marland Kitchen atorvastatin (LIPITOR) 80 MG tablet Take 1 tablet (80 mg total) by mouth daily.  . clopidogrel (PLAVIX) 75 MG tablet Take 1 tablet (75 mg total) by mouth daily.  . ferrous sulfate (FERROUSUL) 325 (65 FE) MG tablet Take 1 tablet (325 mg total) by mouth daily with breakfast.  . ipratropium-albuterol (DUONEB) 0.5-2.5 (3) MG/3ML SOLN Take 3 mLs by nebulization every 4 (four) hours as needed (shortness of breath or wheezing).  . Metoprolol Tartrate 37.5 MG TABS Take 37.5 mg by mouth 2 (two) times daily.  . Multiple Vitamins-Minerals (MULTIVITAMIN ADULT PO) Take 1 tablet by mouth daily.   . Nutritional Supplements (BOOST NUTRITIONAL ENERGY PO) Take 237 mLs by mouth in the morning and at bedtime. Drinking 1-2 per day  . SYNTHROID 150 MCG tablet TAKE 1 TABLET BY MOUTH DAILY BEFORE BREAKFAST. (Patient taking differently: Take 150 mcg by mouth daily before breakfast.)  . triamcinolone (KENALOG) 0.1 % Apply 1 application  topically 2 (two) times daily as needed (rash).  . vitamin B-12 (CYANOCOBALAMIN) 1000 MCG tablet Take 1 tablet (1,000 mcg total) by mouth daily.   No facility-administered encounter medications on file as of 07/03/2020.    Current Diagnosis: Patient Active Problem List   Diagnosis Date Noted  . Closed fracture dislocation of right elbow   . Cancer ([place])   . PAF (paroxysmal atrial fibrillation) ([place]) 06/25/2020  . Acute systolic heart failure ([place]) 06/25/2020  . NSTEMI (non-ST elevated myocardial infarction) ([place]) 06/21/2020  . Closed fracture of right olecranon process 04/10/2020  . Hyperlipemia   . History of prostate cancer   . Other fatigue 02/05/2020  . Dermatitis 02/05/2020  . Anemia 02/05/2020  . Moderate persistent asthma, uncomplicated 33/29/5188  . COPD (chronic obstructive pulmonary disease) ([place]) 01/21/2020  . Atopic dermatitis 12/05/2019  . Acquired hypothyroidism 08/15/2019  . Mixed conductive and sensorineural hearing loss, 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  . Malnutrition of moderate degree ([place]) 08/15/2019  . Senile osteoporosis 08/15/2019  . Nonsustained ventricular tachycardia ([place]) 01/30/2019  . Bradycardia 11/28/2018  . Ventricular ectopy 11/28/2018  . Essential hypertension 11/28/2018  . LVH (left ventricular hypertrophy) 11/28/2018  . Dizziness 02/19/2016  . Bacterial pneumonia, unspecified 06/14/2012  . Hyperlipidemia, mixed 08/21/2008  . SINUSITIS, ACUTE 06/28/2007  . Seasonal and perennial allergic rhinitis 05/17/2007    Chart review noted the patient was admitted to ED on 06/21/20, 06/22/20 had heart cath for CORONARY ATHERECTOMY    Patient  also had some physical therapy ordered.  Patient next appointment is on 12/15/20 with PCP, will follow up on 07/07/20 for MI  Follow-Up:  Pharmacist Review  Donette Larry, [place], [place] Pharmacist Assistant [phone]

## 2020-07-03 NOTE — Telephone Encounter (Signed)
[name] notified of results and verbalized understanding.

## 2020-07-06 DIAGNOSIS — I214 Non-ST elevation (NSTEMI) myocardial infarction: Secondary | ICD-10-CM | POA: Diagnosis not present

## 2020-07-06 DIAGNOSIS — I110 Hypertensive heart disease with heart failure: Secondary | ICD-10-CM | POA: Diagnosis not present

## 2020-07-06 DIAGNOSIS — D649 Anemia, unspecified: Secondary | ICD-10-CM | POA: Diagnosis not present

## 2020-07-06 DIAGNOSIS — I472 Ventricular tachycardia: Secondary | ICD-10-CM | POA: Diagnosis not present

## 2020-07-06 DIAGNOSIS — I5021 Acute systolic (congestive) heart failure: Secondary | ICD-10-CM | POA: Diagnosis not present

## 2020-07-06 DIAGNOSIS — J449 Chronic obstructive pulmonary disease, unspecified: Secondary | ICD-10-CM | POA: Diagnosis not present

## 2020-07-06 DIAGNOSIS — I255 Ischemic cardiomyopathy: Secondary | ICD-10-CM | POA: Diagnosis not present

## 2020-07-06 DIAGNOSIS — I480 Paroxysmal atrial fibrillation: Secondary | ICD-10-CM | POA: Diagnosis not present

## 2020-07-06 DIAGNOSIS — E782 Mixed hyperlipidemia: Secondary | ICD-10-CM | POA: Diagnosis not present

## 2020-07-06 DIAGNOSIS — I2510 Atherosclerotic heart disease of native coronary artery without angina pectoris: Secondary | ICD-10-CM | POA: Diagnosis not present

## 2020-07-07 ENCOUNTER — Telehealth (HOSPITAL_COMMUNITY): Payer: Self-pay | Admitting: Pharmacist

## 2020-07-07 ENCOUNTER — Telehealth (HOSPITAL_COMMUNITY): Payer: Self-pay

## 2020-07-07 ENCOUNTER — Other Ambulatory Visit: Payer: Self-pay

## 2020-07-07 ENCOUNTER — Ambulatory Visit (INDEPENDENT_AMBULATORY_CARE_PROVIDER_SITE_OTHER): Payer: Medicare HMO | Admitting: Legal Medicine

## 2020-07-07 ENCOUNTER — Encounter: Payer: Self-pay | Admitting: Legal Medicine

## 2020-07-07 VITALS — BP 110/64 | HR 74 | Temp 97.3°F | Resp 16 | Ht 72.0 in | Wt 133.0 lb

## 2020-07-07 DIAGNOSIS — I25110 Atherosclerotic heart disease of native coronary artery with unstable angina pectoris: Secondary | ICD-10-CM | POA: Diagnosis not present

## 2020-07-07 DIAGNOSIS — I255 Ischemic cardiomyopathy: Secondary | ICD-10-CM | POA: Diagnosis not present

## 2020-07-07 DIAGNOSIS — D508 Other iron deficiency anemias: Secondary | ICD-10-CM

## 2020-07-07 DIAGNOSIS — I5021 Acute systolic (congestive) heart failure: Secondary | ICD-10-CM | POA: Diagnosis not present

## 2020-07-07 DIAGNOSIS — I214 Non-ST elevation (NSTEMI) myocardial infarction: Secondary | ICD-10-CM | POA: Diagnosis not present

## 2020-07-07 DIAGNOSIS — I2510 Atherosclerotic heart disease of native coronary artery without angina pectoris: Secondary | ICD-10-CM | POA: Insufficient documentation

## 2020-07-07 DIAGNOSIS — J449 Chronic obstructive pulmonary disease, unspecified: Secondary | ICD-10-CM | POA: Diagnosis not present

## 2020-07-07 DIAGNOSIS — E782 Mixed hyperlipidemia: Secondary | ICD-10-CM | POA: Diagnosis not present

## 2020-07-07 DIAGNOSIS — I110 Hypertensive heart disease with heart failure: Secondary | ICD-10-CM | POA: Diagnosis not present

## 2020-07-07 DIAGNOSIS — I480 Paroxysmal atrial fibrillation: Secondary | ICD-10-CM

## 2020-07-07 DIAGNOSIS — D649 Anemia, unspecified: Secondary | ICD-10-CM | POA: Diagnosis not present

## 2020-07-07 DIAGNOSIS — I472 Ventricular tachycardia: Secondary | ICD-10-CM | POA: Diagnosis not present

## 2020-07-07 HISTORY — DX: Atherosclerotic heart disease of native coronary artery without angina pectoris: I25.10

## 2020-07-07 NOTE — Telephone Encounter (Signed)
Pharmacy Transitions of Care Follow-up Telephone Call  Date of discharge: 06/25/20 Discharge Diagnosis: Afib  How have you been since you were released from the hospital? Patient doing well. Knows s/sx of bleeding to look out for.  Medication changes made at discharge: yes  Medication changes obtained and verified? yes    Medication Accessibility:  Home Pharmacy: [facility]   Was the patient provided with refills on discharged medications? yes  Have all prescriptions been transferred from [facility] to home pharmacy? yes   . Is the patient able to afford medications? yes    Medication Review:  CLOPIDOGREL (PLAVIX) Clopidogrel 75 mg once daily.  - Educated patient on expected duration of therapy of with clopidogrel. Advised patient that aspirin will be continued indefinitely.  - Advised patient of medications to avoid (NSAIDs, ASA)  - Educated that Tylenol (acetaminophen) will be the preferred analgesic to prevent risk of bleeding  - Emphasized importance of monitoring for signs and symptoms of bleeding (abnormal bruising, prolonged bleeding, nose bleeds, bleeding from gums, discolored urine, black tarry stools)  - Advised patient to alert all providers of anticoagulation therapy prior to starting a new medication or having a procedure    Follow-up Appointments:  Had follow up with PCP today on 07/17/20 and follow up with Pulmonology on 07/17/20  If their condition worsens, is the pt aware to call PCP or go to the Emergency Dept.? yes  Final Patient Assessment: Patient doing well. Has refills at home pharmacy and follow ups scheduled.

## 2020-07-07 NOTE — Telephone Encounter (Signed)
Transitions of Care Pharmacy   Call attempted for a pharmacy transitions of care follow-up. HIPAA appropriate voicemail was left with call back information provided.   Call attempt #2. Will follow-up in 2-3 days.   Garnet Sierras, PharmD

## 2020-07-07 NOTE — Progress Notes (Signed)
Subjective:  Patient ID: James Iha Sr., male    DOB: 05/24/1931  Age: 87 y.o. MRN: [id number]  Chief Complaint  Patient presents with  . Transitions Of Care    Patient was admitted on 06/21/2020 to 06/25/2020 at [facility]  . Atrial Fibrillation    HPI transition of care and reconciliation of medicines Patient admitted on 06/21/2020 for NSTEMI, he had catheterization and was found to have 90& mid LAD lesion. He has atherectomy and stent placement 06/24/2020 by Dr. Rogue Jury.  No complications.  He was discharged on 06/25/2020.  He is now on atorvastatin and plavix.  Conclusions: 1. Severe single-vessel coronary artery disease with heavily calcified mid LAD stenosis of up to 90%. 2. Mild to moderate, ostial LMCA, proximal LAD, OM2, and proximal RCA disease. 3. Moderately to severely reduced left ventricular systolic function with mid/apical anterior and apical hypokinesis/akinesis. 4. Moderately elevated left ventricular filling pressure (LVEDP ~25 mmHg).     Current Outpatient Medications on File Prior to Visit  Medication Sig Dispense Refill  . acetaminophen (TYLENOL) 325 MG tablet Take 2 tablets (650 mg total) by mouth every 6 (six) hours as needed. 30 tablet 1  . albuterol (VENTOLIN HFA) 108 (90 Base) MCG/ACT inhaler INHALE 2 PUFFS INTO THE LUNGS EVERY 6 HOURS AS NEEDED FOR WHEEZING OR SHORTNESS OF BREATH (Patient taking differently: Inhale 2 puffs into the lungs every 6 (six) hours as needed for wheezing or shortness of breath. INHALE 2 PUFFS INTO THE LUNGS EVERY 6 HOURS AS NEEDED FOR WHEEZING OR SHORTNESS OF BREATH) 18 g 6  . alendronate (FOSAMAX) 70 MG tablet Take 70 mg by mouth once a week. Friday    . aspirin EC 81 MG EC tablet Take 1 tablet (81 mg total) by mouth daily. Swallow whole. 90 tablet 3  . atorvastatin (LIPITOR) 80 MG tablet Take 1 tablet (80 mg total) by mouth daily. 90 tablet 3  . clopidogrel (PLAVIX) 75 MG tablet Take 1 tablet (75 mg total) by mouth  daily. 90 tablet 3  . ferrous sulfate (FERROUSUL) 325 (65 FE) MG tablet Take 1 tablet (325 mg total) by mouth daily with breakfast. 30 tablet 12  . ipratropium-albuterol (DUONEB) 0.5-2.5 (3) MG/3ML SOLN Take 3 mLs by nebulization every 4 (four) hours as needed (shortness of breath or wheezing).    . Metoprolol Tartrate 37.5 MG TABS Take 37.5 mg by mouth 2 (two) times daily. 60 tablet 11  . Multiple Vitamins-Minerals (MULTIVITAMIN ADULT PO) Take 1 tablet by mouth daily.     . Nutritional Supplements (BOOST NUTRITIONAL ENERGY PO) Take 237 mLs by mouth in the morning and at bedtime. Drinking 1-2 per day    . SYNTHROID 150 MCG tablet TAKE 1 TABLET BY MOUTH DAILY BEFORE BREAKFAST. (Patient taking differently: Take 150 mcg by mouth daily before breakfast.) 90 tablet 2  . triamcinolone (KENALOG) 0.1 % Apply 1 application topically 2 (two) times daily as needed (rash).    . vitamin B-12 (CYANOCOBALAMIN) 1000 MCG tablet Take 1 tablet (1,000 mcg total) by mouth daily. 30 tablet 0   No current facility-administered medications on file prior to visit.   Past Medical History:  Diagnosis Date  . Acquired hypothyroidism 08/15/2019  . Acute systolic heart failure ([place]) 06/25/2020  . Anemia 02/05/2020   boarderline anemic  . Atopic dermatitis 12/05/2019  . Bacterial pneumonia, unspecified 06/14/2012   06/14/2012 CXR w/ RML PNA >Zithromax and Rocephin    . Bradycardia 11/28/2018  . Cancer ([place])  prostate cancer-seed implant  . Closed fracture dislocation of right elbow   . Closed fracture of right olecranon process 04/10/2020  . COPD (chronic obstructive pulmonary disease) ([place]) 01/21/2020  . Dermatitis 02/05/2020  . Dizziness 02/19/2016  . Essential hypertension 11/28/2018  . History of prostate cancer    seed implant  . Hyperlipemia   . Hyperlipidemia, mixed 08/21/2008   Qualifier: Diagnosis of  By: Annamaria Boots MD, Clinton D   . LVH (left ventricular hypertrophy) 11/28/2018  . Malnutrition of moderate degree ([place])  08/15/2019  . Mixed conductive and sensorineural hearing loss, 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  . Moderate persistent asthma, uncomplicated 4/78/2956  . Nonsustained ventricular tachycardia ([place]) 01/30/2019  . NSTEMI (non-ST elevated myocardial infarction) ([place]) 06/21/2020  . Other fatigue 02/05/2020  . PAF (paroxysmal atrial fibrillation) ([place]) 06/25/2020  . Seasonal and perennial allergic rhinitis 05/17/2007      . Senile osteoporosis 08/15/2019  . SINUSITIS, ACUTE 06/28/2007   Qualifier: Diagnosis of  By: Royal Piedra NP, Tammy    . Ventricular ectopy 11/28/2018   Past Surgical History:  Procedure Laterality Date  . APPENDECTOMY    . CORONARY ATHERECTOMY N/A 06/24/2020   Procedure: CORONARY ATHERECTOMY;  Surgeon: Wellington Hampshire, MD;  Location: [place] [facility];  Service: Cardiovascular;  Laterality: N/A;  . INTRAVASCULAR ULTRASOUND/IVUS N/A 06/24/2020   Procedure: Intravascular Ultrasound/IVUS;  Surgeon: Wellington Hampshire, MD;  Location: [place] [facility];  Service: Cardiovascular;  Laterality: N/A;  . LEFT HEART CATH AND CORONARY ANGIOGRAPHY N/A 06/22/2020   Procedure: LEFT HEART CATH AND CORONARY ANGIOGRAPHY;  Surgeon: Nelva Bush, MD;  Location: [place] [facility];  Service: Cardiovascular;  Laterality: N/A;  . ORIF ELBOW FRACTURE Right 04/10/2020   Procedure: OPEN REDUCTION INTERNAL FIXATION (ORIF) ELBOW/OLECRANON FRACTURE;  Surgeon: Marchia Bond, MD;  Location: [place];  Service: Orthopedics;  Laterality: Right;    Family History  Problem Relation Age of Onset  . Alzheimer's disease Father   . Diabetes Mother    Social History   Socioeconomic History  . Marital status: Married    Spouse name: Not on file  . Number of children: Not on file  . Years of education: Not on file  . Highest education level: Not on file  Occupational History  . Occupation: Retired    Fish farm manager: OTHER    Comment: Scientist, research (life sciences)  Tobacco Use  . Smoking status: Former Smoker    Types:  Cigarettes    Quit date: 1960    Years since quitting: 62.2  . Smokeless tobacco: Former Systems developer    Types: [place] date: 2011  Vaping Use  . Vaping Use: Never used  Substance and Sexual Activity  . Alcohol use: No  . Drug use: No  . Sexual activity: Not Currently  Other Topics Concern  . Not on file  Social History Narrative  . Not on file   Social Determinants of Health   Financial Resource Strain: Not on file  Food Insecurity: Not on file  Transportation Needs: No Transportation Needs  . Lack of Transportation (Medical): No  . Lack of Transportation (Non-Medical): No  Physical Activity: Sufficiently Active  . Days of Exercise per Week: 6 days  . Minutes of Exercise per Session: 30 min  Stress: Not on file  Social Connections: Not on file    Review of Systems  Constitutional: Negative for activity change, appetite change and unexpected weight change.  HENT: Positive for sinus pain. Negative for congestion.   Eyes:  Negative for visual disturbance.  Respiratory: Negative for shortness of breath.   Cardiovascular: Negative for chest pain, palpitations and leg swelling.  Gastrointestinal: Negative for abdominal distention and abdominal pain.  Endocrine: Negative for polyuria.  Genitourinary: Negative for difficulty urinating, dysuria and urgency.  Musculoskeletal: Negative for arthralgias and back pain.  Skin: Negative.   Neurological: Negative.   Psychiatric/Behavioral: Negative.      Objective:  BP 110/64   Pulse 74   Temp (!) 97.3 F (36.3 C)   Resp 16   Ht 6' (1.829 m)   Wt 133 lb (60.3 kg)   BMI 18.04 kg/m   BP/Weight 07/07/2020 06/29/2020 11/09/5007  Systolic BP 381 829 937  Diastolic BP 64 64 99  Wt. (Lbs) 133 135 134.92  BMI 18.04 18.31 18.3    Physical Exam Vitals reviewed.  Constitutional:      Appearance: Normal appearance.  HENT:     Right Ear: Tympanic membrane normal.     Left Ear: Tympanic membrane normal.  Cardiovascular:     Rate  and Rhythm: Normal rate and regular rhythm.     Pulses: Normal pulses.     Heart sounds: No murmur heard. No gallop.   Pulmonary:     Effort: Pulmonary effort is normal. No respiratory distress.     Breath sounds: Normal breath sounds. No rales.  Musculoskeletal:        General: Normal range of motion.     Cervical back: Normal range of motion and neck supple.  Skin:    General: Skin is warm and dry.     Capillary Refill: Capillary refill takes less than 2 seconds.  Neurological:     Mental Status: He is alert.       Lab Results  Component Value Date   WBC 15.9 (H) 06/29/2020   HGB 9.3 (L) 06/29/2020   HCT 29.6 (L) 06/29/2020   PLT 314 06/29/2020   GLUCOSE 82 06/29/2020   CHOL 206 (H) 06/16/2020   TRIG 113 06/16/2020   HDL 49 06/16/2020   LDLCALC 137 (H) 06/16/2020   ALT 13 06/16/2020   AST 18 06/16/2020   NA 134 06/29/2020   K 4.9 06/29/2020   CL 99 06/29/2020   CREATININE 0.88 06/29/2020   BUN 12 06/29/2020   CO2 21 06/29/2020   TSH 1.360 06/16/2020   HGBA1C 6.3 (H) 06/22/2020      Assessment & Plan:  Diagnoses and all orders for this visit: NSTEMI (non-ST elevated myocardial infarction) [facility]) Patient has a NSTEMI at [place] and is doing well.  Coronary artery disease involving native coronary artery of native heart with unstable angina pectoris ([place]) Patient's CAD was assessed using history and physical along with other information to maximize treatment.  Evidence based criteria was use in deciding proper management for this disease process.  Patient's CAD is under good control.therapy continue present medicines.  He has 90% mid LAD lesion with atherectomy and stent placement. Iron deficiency anemia secondary to inadequate dietary iron intake -     CBC with Differential/Platelet Patient has iron deficient anemia and is on iron supplements.  We will consider new colonoscopy.  PAF (paroxysmal atrial fibrillation) ([place]) Patient has a diagnosis of  paroxysmal atrial fibrillation.   Patient is on no anticoagulant and has controlled ventricular response.  Patient is CV stable.          I spent 40 minutes dedicated to the care of this patient on the date of this encounter to include  face-to-face time with the patient, as well as:   Follow-up: Return in about 1 month (around 08/07/2020) for Nstemi.  An After Visit Summary was printed and given to the patient.  Reinaldo Meeker, MD [facility] [phone]

## 2020-07-08 LAB — CBC WITH DIFFERENTIAL/PLATELET
Basophils Absolute: 0.1 10*3/uL (ref 0.0–0.2)
Basos: 1 %
EOS (ABSOLUTE): 0.5 10*3/uL — ABNORMAL HIGH (ref 0.0–0.4)
Eos: 5 %
Hematocrit: 30.4 % — ABNORMAL LOW (ref 37.5–51.0)
Hemoglobin: 9.6 g/dL — ABNORMAL LOW (ref 13.0–17.7)
Immature Grans (Abs): 0.1 10*3/uL (ref 0.0–0.1)
Immature Granulocytes: 1 %
Lymphocytes Absolute: 1.8 10*3/uL (ref 0.7–3.1)
Lymphs: 19 %
MCH: 27.3 pg (ref 26.6–33.0)
MCHC: 31.6 g/dL (ref 31.5–35.7)
MCV: 86 fL (ref 79–97)
Monocytes Absolute: 1.3 10*3/uL — ABNORMAL HIGH (ref 0.1–0.9)
Monocytes: 14 %
Neutrophils Absolute: 5.9 10*3/uL (ref 1.4–7.0)
Neutrophils: 60 %
Platelets: 309 10*3/uL (ref 150–450)
RBC: 3.52 x10E6/uL — ABNORMAL LOW (ref 4.14–5.80)
RDW: 14.4 % (ref 11.6–15.4)
WBC: 9.6 10*3/uL (ref 3.4–10.8)

## 2020-07-08 NOTE — Progress Notes (Signed)
Blood count is slightly higher than last time, keep on iron supplements lp

## 2020-07-09 DIAGNOSIS — I255 Ischemic cardiomyopathy: Secondary | ICD-10-CM | POA: Diagnosis not present

## 2020-07-09 DIAGNOSIS — I214 Non-ST elevation (NSTEMI) myocardial infarction: Secondary | ICD-10-CM | POA: Diagnosis not present

## 2020-07-09 DIAGNOSIS — I2510 Atherosclerotic heart disease of native coronary artery without angina pectoris: Secondary | ICD-10-CM | POA: Diagnosis not present

## 2020-07-09 DIAGNOSIS — E782 Mixed hyperlipidemia: Secondary | ICD-10-CM | POA: Diagnosis not present

## 2020-07-09 DIAGNOSIS — J449 Chronic obstructive pulmonary disease, unspecified: Secondary | ICD-10-CM | POA: Diagnosis not present

## 2020-07-09 DIAGNOSIS — I5021 Acute systolic (congestive) heart failure: Secondary | ICD-10-CM | POA: Diagnosis not present

## 2020-07-09 DIAGNOSIS — I110 Hypertensive heart disease with heart failure: Secondary | ICD-10-CM | POA: Diagnosis not present

## 2020-07-09 DIAGNOSIS — D649 Anemia, unspecified: Secondary | ICD-10-CM | POA: Diagnosis not present

## 2020-07-09 DIAGNOSIS — I480 Paroxysmal atrial fibrillation: Secondary | ICD-10-CM | POA: Diagnosis not present

## 2020-07-09 DIAGNOSIS — I472 Ventricular tachycardia: Secondary | ICD-10-CM | POA: Diagnosis not present

## 2020-07-14 DIAGNOSIS — I5021 Acute systolic (congestive) heart failure: Secondary | ICD-10-CM | POA: Diagnosis not present

## 2020-07-14 DIAGNOSIS — J449 Chronic obstructive pulmonary disease, unspecified: Secondary | ICD-10-CM | POA: Diagnosis not present

## 2020-07-14 DIAGNOSIS — I2510 Atherosclerotic heart disease of native coronary artery without angina pectoris: Secondary | ICD-10-CM | POA: Diagnosis not present

## 2020-07-14 DIAGNOSIS — I110 Hypertensive heart disease with heart failure: Secondary | ICD-10-CM | POA: Diagnosis not present

## 2020-07-14 DIAGNOSIS — I472 Ventricular tachycardia: Secondary | ICD-10-CM | POA: Diagnosis not present

## 2020-07-14 DIAGNOSIS — I480 Paroxysmal atrial fibrillation: Secondary | ICD-10-CM | POA: Diagnosis not present

## 2020-07-14 DIAGNOSIS — I214 Non-ST elevation (NSTEMI) myocardial infarction: Secondary | ICD-10-CM | POA: Diagnosis not present

## 2020-07-14 DIAGNOSIS — I255 Ischemic cardiomyopathy: Secondary | ICD-10-CM | POA: Diagnosis not present

## 2020-07-14 DIAGNOSIS — D649 Anemia, unspecified: Secondary | ICD-10-CM | POA: Diagnosis not present

## 2020-07-14 DIAGNOSIS — E782 Mixed hyperlipidemia: Secondary | ICD-10-CM | POA: Diagnosis not present

## 2020-07-15 DIAGNOSIS — S52021D Displaced fracture of olecranon process without intraarticular extension of right ulna, subsequent encounter for closed fracture with routine healing: Secondary | ICD-10-CM | POA: Diagnosis not present

## 2020-07-16 DIAGNOSIS — I480 Paroxysmal atrial fibrillation: Secondary | ICD-10-CM | POA: Diagnosis not present

## 2020-07-16 DIAGNOSIS — D649 Anemia, unspecified: Secondary | ICD-10-CM | POA: Diagnosis not present

## 2020-07-16 DIAGNOSIS — I255 Ischemic cardiomyopathy: Secondary | ICD-10-CM | POA: Diagnosis not present

## 2020-07-16 DIAGNOSIS — I2510 Atherosclerotic heart disease of native coronary artery without angina pectoris: Secondary | ICD-10-CM | POA: Diagnosis not present

## 2020-07-16 DIAGNOSIS — I5021 Acute systolic (congestive) heart failure: Secondary | ICD-10-CM | POA: Diagnosis not present

## 2020-07-16 DIAGNOSIS — I472 Ventricular tachycardia: Secondary | ICD-10-CM | POA: Diagnosis not present

## 2020-07-16 DIAGNOSIS — E782 Mixed hyperlipidemia: Secondary | ICD-10-CM | POA: Diagnosis not present

## 2020-07-16 DIAGNOSIS — J449 Chronic obstructive pulmonary disease, unspecified: Secondary | ICD-10-CM | POA: Diagnosis not present

## 2020-07-16 DIAGNOSIS — I214 Non-ST elevation (NSTEMI) myocardial infarction: Secondary | ICD-10-CM | POA: Diagnosis not present

## 2020-07-16 DIAGNOSIS — I110 Hypertensive heart disease with heart failure: Secondary | ICD-10-CM | POA: Diagnosis not present

## 2020-07-17 ENCOUNTER — Ambulatory Visit: Payer: Medicare HMO | Admitting: Internal Medicine

## 2020-07-20 DIAGNOSIS — I480 Paroxysmal atrial fibrillation: Secondary | ICD-10-CM | POA: Diagnosis not present

## 2020-07-20 DIAGNOSIS — I214 Non-ST elevation (NSTEMI) myocardial infarction: Secondary | ICD-10-CM | POA: Diagnosis not present

## 2020-07-20 DIAGNOSIS — I472 Ventricular tachycardia: Secondary | ICD-10-CM | POA: Diagnosis not present

## 2020-07-20 DIAGNOSIS — I2510 Atherosclerotic heart disease of native coronary artery without angina pectoris: Secondary | ICD-10-CM | POA: Diagnosis not present

## 2020-07-20 DIAGNOSIS — I110 Hypertensive heart disease with heart failure: Secondary | ICD-10-CM | POA: Diagnosis not present

## 2020-07-20 DIAGNOSIS — I5021 Acute systolic (congestive) heart failure: Secondary | ICD-10-CM | POA: Diagnosis not present

## 2020-07-20 DIAGNOSIS — E782 Mixed hyperlipidemia: Secondary | ICD-10-CM | POA: Diagnosis not present

## 2020-07-20 DIAGNOSIS — D649 Anemia, unspecified: Secondary | ICD-10-CM | POA: Diagnosis not present

## 2020-07-20 DIAGNOSIS — J449 Chronic obstructive pulmonary disease, unspecified: Secondary | ICD-10-CM | POA: Diagnosis not present

## 2020-07-20 DIAGNOSIS — I255 Ischemic cardiomyopathy: Secondary | ICD-10-CM | POA: Diagnosis not present

## 2020-07-22 DIAGNOSIS — I255 Ischemic cardiomyopathy: Secondary | ICD-10-CM | POA: Diagnosis not present

## 2020-07-22 DIAGNOSIS — I480 Paroxysmal atrial fibrillation: Secondary | ICD-10-CM | POA: Diagnosis not present

## 2020-07-22 DIAGNOSIS — I2510 Atherosclerotic heart disease of native coronary artery without angina pectoris: Secondary | ICD-10-CM | POA: Diagnosis not present

## 2020-07-22 DIAGNOSIS — E782 Mixed hyperlipidemia: Secondary | ICD-10-CM | POA: Diagnosis not present

## 2020-07-22 DIAGNOSIS — J449 Chronic obstructive pulmonary disease, unspecified: Secondary | ICD-10-CM | POA: Diagnosis not present

## 2020-07-22 DIAGNOSIS — D649 Anemia, unspecified: Secondary | ICD-10-CM | POA: Diagnosis not present

## 2020-07-22 DIAGNOSIS — I472 Ventricular tachycardia: Secondary | ICD-10-CM | POA: Diagnosis not present

## 2020-07-22 DIAGNOSIS — I214 Non-ST elevation (NSTEMI) myocardial infarction: Secondary | ICD-10-CM | POA: Diagnosis not present

## 2020-07-22 DIAGNOSIS — I5021 Acute systolic (congestive) heart failure: Secondary | ICD-10-CM | POA: Diagnosis not present

## 2020-07-22 DIAGNOSIS — I110 Hypertensive heart disease with heart failure: Secondary | ICD-10-CM | POA: Diagnosis not present

## 2020-07-28 NOTE — Progress Notes (Signed)
Subjective:    Patient ID: [name], male    DOB: 1932-01-25, 87 y.o.   MRN: [id number]  HPI  male never smoker followed for allergic rhinitis, mild intermittent asthma, history sinusitis, complicated by history prostate cancer, hyperlipidemia, hypothyroid, PAFIib/ Plavix ,NSTEM MI/ CAD/ Stents, HTN,  Dizziness, Anemia,   FENO 08/22/16- 19- WNL Office Spirometry 08/22/16- WNL- FVC 3.65/ 85%, FEV1 2.70-/ 89%, ratio 0.74, FEF 25-75% 2.03/ 98% ------------------------------------------------------------------------------------------   07/18/19- 87 year old male never smoker followed for allergic rhinitis, mild asthma, history sinusitis, complicated by history prostate cancer, hyperlipidemia, hypothyroid, HTN, Bradycardia, Dizziness,  Ventolin hfa, Neb Duoneb, Nasacort,                  Son here -----f/u Asthma mild intermittent. Breathing is at patient's baseline Had flu vax and both Phizer covid vax Breathing has been stable, well controlled this winter, using neb or rescue inhaler occasionally wth no sustained exacerbation. Feels well controlled. Slipped on wet curb and fell, fx wrist. Son says prior dizziness has been better and was not a factor.  07/28/20- 87 year old male never smoker followed for allergic Rhinitis, mild Asthma, history Sinusitis, complicated by history Prostate Cancer, Hyperlipidemia, Hypothyroid, HTN, Bradycardia PAFIib/ Plavix ,NSTEM MI/ CAD/ Stents, HTN,  Dizziness, Anemia, Hypothyroid,  -Ventolin hfa, Neb Duoneb, Nasacort, Covid vax-     3 Phizer                                               Son  here Flu vax-had -----Pt had a heart attack in February , son states  his voice has been hoarse since then. Breathing has been okay. ACT score- 21 Says breathing is "fine" and son concurs. CXR 2/13/222-  FINDINGS: The heart size and mediastinal contours are within normal limits. Lungs are hyperexpanded both lungs are clear. The visualized skeletal structures are  unremarkable. Aortic calcifications are noted. IMPRESSION: No active cardiopulmonary disease.  ROS-see HPI  + = positive Constitutional:   +  weight loss, night sweats, fevers, chills, fatigue, lassitude. HEENT:   No-  headaches, difficulty swallowing, tooth/dental problems, sore throat,       No-  sneezing, itching, ear ache, +nasal congestion, +post nasal drip,  CV:  No-   chest pain, orthopnea, PND, swelling in lower extremities, anasarca, dizziness, palpitations Resp: + shortness of breath with exertion or at rest.               productive cough,   non-productive cough,  No- coughing up of blood.           change in color of mucus.  No- wheezing.   Skin: No-   rash or lesions. GI:  No-   heartburn, indigestion, abdominal pain, nausea, vomiting,  GU:  MS:  No-   joint pain or swelling.   Neuro-     nothing unusual Psych:  No- change in mood or affect. No depression or anxiety.  No memory loss.  OBJ- Physical Exam General- Alert, Oriented, Affect-appropriate, Distress- none acute. + Thin, talkative Skin- rash-none, lesions- none, excoriation- none Lymphadenopathy- none Head- atraumatic            Eyes- Gross vision intact, PERRLA, conjunctivae and secretions clear            Ears-  + very HOH, + hearing aids  Nose- Clear, no-Septal dev, mucus, polyps, erosion, perforation             Throat- Mallampati II , mucosa -clear, drainage-none seen, tonsils- atrophic, =Weak voice Neck- flexible , trachea midline, no stridor , thyroid nl, carotid no bruit Chest - symmetrical excursion , unlabored           Heart/CV- RRR/ rare skip , no murmur , no gallop  , no rub, nl s1 s2                           - JVD- none , edema- none, stasis changes- none, varices- none           Lung- +distant/unlabored, wheeze- none , dullness-none, rub- none           Chest wall-  Abd-  Br/ Gen/ Rectal- Not done, not indicated Extrem- + R forearm cast Neuro- grossly intact to observation

## 2020-07-29 ENCOUNTER — Encounter: Payer: Self-pay | Admitting: Internal Medicine

## 2020-07-29 ENCOUNTER — Other Ambulatory Visit: Payer: Self-pay

## 2020-07-29 ENCOUNTER — Ambulatory Visit: Payer: Medicare HMO | Admitting: Internal Medicine

## 2020-07-29 DIAGNOSIS — J4540 Moderate persistent asthma, uncomplicated: Secondary | ICD-10-CM | POA: Diagnosis not present

## 2020-07-29 DIAGNOSIS — R490 Dysphonia: Secondary | ICD-10-CM | POA: Diagnosis not present

## 2020-07-29 NOTE — Patient Instructions (Signed)
Ok to continue present meds, and to add an otc antihistamine and Flonase if needed  Order- referral to [facility] ( the group with Dr Redmond Baseman and Dr Wilburn Cornelia)  For hoarse since MI  Please call if we can help

## 2020-07-30 DIAGNOSIS — I214 Non-ST elevation (NSTEMI) myocardial infarction: Secondary | ICD-10-CM | POA: Diagnosis not present

## 2020-07-30 DIAGNOSIS — D649 Anemia, unspecified: Secondary | ICD-10-CM | POA: Diagnosis not present

## 2020-07-30 DIAGNOSIS — I5021 Acute systolic (congestive) heart failure: Secondary | ICD-10-CM | POA: Diagnosis not present

## 2020-07-30 DIAGNOSIS — Z955 Presence of coronary angioplasty implant and graft: Secondary | ICD-10-CM | POA: Diagnosis not present

## 2020-07-30 DIAGNOSIS — H9193 Unspecified hearing loss, bilateral: Secondary | ICD-10-CM | POA: Diagnosis not present

## 2020-07-30 DIAGNOSIS — E782 Mixed hyperlipidemia: Secondary | ICD-10-CM | POA: Diagnosis not present

## 2020-07-30 DIAGNOSIS — I472 Ventricular tachycardia: Secondary | ICD-10-CM | POA: Diagnosis not present

## 2020-07-30 DIAGNOSIS — I480 Paroxysmal atrial fibrillation: Secondary | ICD-10-CM | POA: Diagnosis not present

## 2020-07-30 DIAGNOSIS — D72829 Elevated white blood cell count, unspecified: Secondary | ICD-10-CM | POA: Diagnosis not present

## 2020-07-30 DIAGNOSIS — I255 Ischemic cardiomyopathy: Secondary | ICD-10-CM | POA: Diagnosis not present

## 2020-07-30 DIAGNOSIS — E039 Hypothyroidism, unspecified: Secondary | ICD-10-CM | POA: Diagnosis not present

## 2020-07-30 DIAGNOSIS — Z7902 Long term (current) use of antithrombotics/antiplatelets: Secondary | ICD-10-CM | POA: Diagnosis not present

## 2020-07-30 DIAGNOSIS — J449 Chronic obstructive pulmonary disease, unspecified: Secondary | ICD-10-CM | POA: Diagnosis not present

## 2020-07-30 DIAGNOSIS — I110 Hypertensive heart disease with heart failure: Secondary | ICD-10-CM | POA: Diagnosis not present

## 2020-07-30 DIAGNOSIS — Z7982 Long term (current) use of aspirin: Secondary | ICD-10-CM | POA: Diagnosis not present

## 2020-07-30 DIAGNOSIS — I2510 Atherosclerotic heart disease of native coronary artery without angina pectoris: Secondary | ICD-10-CM | POA: Diagnosis not present

## 2020-08-05 DIAGNOSIS — I5021 Acute systolic (congestive) heart failure: Secondary | ICD-10-CM | POA: Diagnosis not present

## 2020-08-05 DIAGNOSIS — E782 Mixed hyperlipidemia: Secondary | ICD-10-CM | POA: Diagnosis not present

## 2020-08-05 DIAGNOSIS — Z7902 Long term (current) use of antithrombotics/antiplatelets: Secondary | ICD-10-CM | POA: Diagnosis not present

## 2020-08-05 DIAGNOSIS — E039 Hypothyroidism, unspecified: Secondary | ICD-10-CM | POA: Diagnosis not present

## 2020-08-05 DIAGNOSIS — I2510 Atherosclerotic heart disease of native coronary artery without angina pectoris: Secondary | ICD-10-CM | POA: Diagnosis not present

## 2020-08-05 DIAGNOSIS — H9193 Unspecified hearing loss, bilateral: Secondary | ICD-10-CM | POA: Diagnosis not present

## 2020-08-05 DIAGNOSIS — I480 Paroxysmal atrial fibrillation: Secondary | ICD-10-CM | POA: Diagnosis not present

## 2020-08-05 DIAGNOSIS — J449 Chronic obstructive pulmonary disease, unspecified: Secondary | ICD-10-CM | POA: Diagnosis not present

## 2020-08-05 DIAGNOSIS — I255 Ischemic cardiomyopathy: Secondary | ICD-10-CM | POA: Diagnosis not present

## 2020-08-05 DIAGNOSIS — I214 Non-ST elevation (NSTEMI) myocardial infarction: Secondary | ICD-10-CM | POA: Diagnosis not present

## 2020-08-05 DIAGNOSIS — Z955 Presence of coronary angioplasty implant and graft: Secondary | ICD-10-CM | POA: Diagnosis not present

## 2020-08-05 DIAGNOSIS — I472 Ventricular tachycardia: Secondary | ICD-10-CM | POA: Diagnosis not present

## 2020-08-05 DIAGNOSIS — I110 Hypertensive heart disease with heart failure: Secondary | ICD-10-CM | POA: Diagnosis not present

## 2020-08-05 DIAGNOSIS — D72829 Elevated white blood cell count, unspecified: Secondary | ICD-10-CM | POA: Diagnosis not present

## 2020-08-05 DIAGNOSIS — D649 Anemia, unspecified: Secondary | ICD-10-CM | POA: Diagnosis not present

## 2020-08-05 DIAGNOSIS — Z7982 Long term (current) use of aspirin: Secondary | ICD-10-CM | POA: Diagnosis not present

## 2020-08-07 ENCOUNTER — Other Ambulatory Visit: Payer: Self-pay

## 2020-08-11 ENCOUNTER — Other Ambulatory Visit: Payer: Self-pay

## 2020-08-11 ENCOUNTER — Encounter: Payer: Self-pay | Admitting: Cardiology

## 2020-08-11 ENCOUNTER — Ambulatory Visit: Payer: Medicare HMO | Admitting: Cardiology

## 2020-08-11 ENCOUNTER — Ambulatory Visit (INDEPENDENT_AMBULATORY_CARE_PROVIDER_SITE_OTHER): Payer: Medicare HMO | Admitting: Legal Medicine

## 2020-08-11 ENCOUNTER — Encounter: Payer: Self-pay | Admitting: Legal Medicine

## 2020-08-11 VITALS — BP 138/78 | HR 68 | Temp 97.2°F | Resp 16 | Ht 73.0 in | Wt 133.0 lb

## 2020-08-11 VITALS — BP 120/80 | HR 63 | Ht 73.0 in | Wt 133.0 lb

## 2020-08-11 DIAGNOSIS — I25110 Atherosclerotic heart disease of native coronary artery with unstable angina pectoris: Secondary | ICD-10-CM

## 2020-08-11 DIAGNOSIS — I480 Paroxysmal atrial fibrillation: Secondary | ICD-10-CM | POA: Diagnosis not present

## 2020-08-11 DIAGNOSIS — I493 Ventricular premature depolarization: Secondary | ICD-10-CM

## 2020-08-11 DIAGNOSIS — C801 Malignant (primary) neoplasm, unspecified: Secondary | ICD-10-CM | POA: Diagnosis not present

## 2020-08-11 DIAGNOSIS — E782 Mixed hyperlipidemia: Secondary | ICD-10-CM

## 2020-08-11 NOTE — Patient Instructions (Signed)
Medication Instructions:  Your physician recommends that you continue on your current medications as directed. Please refer to the Current Medication list given to you today.  *If you need a refill on your cardiac medications before your next appointment, please call your pharmacy*   Lab Work: Your physician recommends that you return for lab work today: lipid, lft   If you have labs (blood work) drawn today and your tests are completely normal, you will receive your results only by: Marland Kitchen MyChart Message (if you have MyChart) OR . A paper copy in the mail If you have any lab test that is abnormal or we need to change your treatment, we will call you to review the results.   Testing/Procedures: None   Follow-Up: At [facility], you and your health needs are our priority.  As part of our continuing mission to provide you with exceptional heart care, we have created designated Provider Care Teams.  These Care Teams include your primary Cardiologist (physician) and Advanced Practice Providers (APPs -  Physician Assistants and Nurse Practitioners) who all work together to provide you with the care you need, when you need it.  We recommend signing up for the patient portal called "MyChart".  Sign up information is provided on this After Visit Summary.  MyChart is used to connect with patients for Virtual Visits (Telemedicine).  Patients are able to view lab/test results, encounter notes, upcoming appointments, etc.  Non-urgent messages can be sent to your provider as well.   To learn more about what you can do with MyChart, go to [email or web address].    Your next appointment:   4 month(s)  The format for your next appointment:   In Person  Provider:   [name], MD   Other Instructions

## 2020-08-11 NOTE — Progress Notes (Signed)
Cardiology Office Note:    Date:  08/11/2020   ID:  [name], DOB [birth year], MRN [id number]  PCP:  [name], MD  Cardiologist:  [name], MD    Referring MD: [name],*   Chief Complaint  Patient presents with  . Follow-up  Doing well  History of Present Illness:    James Sorbo Sr. is a 87 y.o. male   male who was initially referred to [place] because of sinus bradycardia, nonsustained ventricular tachycardia.  Work-up however has been negative that included stress test done more than a year ago.  Recently he ended up having some chest pain he went to [facility] he was find to be in atrial fibrillation with fast ventricular rate he also got some spell of troponin.  After that cardiac catheterization has been done which showed critical more than 90% lesion in the mid LAD.  Eventually he did have staged procedures to open up the artery he required Rotablator and of this lesion and stenting.  He comes today to my office for follow-up after that.  He was also found to be in atrial fibrillation he was put on Cardizem and converted spontaneously, he is not anticoagulated. He comes today 2 months of follow-up overall doing well.  Denies of any chest pain tightness squeezing pressure burning chest is still very fragile but see if he can have returned to his baseline before events.  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   Past Medical History:  Diagnosis Date  . Acquired hypothyroidism 08/15/2019  . Acute systolic heart failure ([place]) 06/25/2020  . Anemia 02/05/2020   boarderline anemic  . Atopic dermatitis 12/05/2019  . Bacterial pneumonia, unspecified 06/14/2012   06/14/2012 CXR w/ RML PNA >Zithromax and Rocephin    . Bradycardia 11/28/2018  . CAD (coronary artery disease) 07/07/2020  . Cancer [facility])    prostate cancer-seed implant  . Closed fracture dislocation of right elbow   . Closed fracture of right  olecranon process 04/10/2020  . COPD (chronic obstructive pulmonary disease) ([place]) 01/21/2020  . Dermatitis 02/05/2020  . Dizziness 02/19/2016  . Essential hypertension 11/28/2018  . History of prostate cancer    seed implant  . Hyperlipemia   . Hyperlipidemia, mixed 08/21/2008   Qualifier: Diagnosis of  By: Annamaria Boots MD, Clinton D   . LVH (left ventricular hypertrophy) 11/28/2018  . Malnutrition of moderate degree ([place]) 08/15/2019  . Mixed conductive and sensorineural hearing loss, 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  . Moderate persistent asthma, uncomplicated 01/07/5175  . Nonsustained ventricular tachycardia ([place]) 01/30/2019  . NSTEMI (non-ST elevated myocardial infarction) ([place]) 06/21/2020  . Other fatigue 02/05/2020  . PAF (paroxysmal atrial fibrillation) ([place]) 06/25/2020  . Seasonal and perennial allergic rhinitis 05/17/2007      . Senile osteoporosis 08/15/2019  . SINUSITIS, ACUTE 06/28/2007   Qualifier: Diagnosis of  By: Royal Piedra NP, Tammy    . Ventricular ectopy 11/28/2018    Past Surgical History:  Procedure Laterality Date  . APPENDECTOMY    . CORONARY ATHERECTOMY N/A 06/24/2020   Procedure: CORONARY ATHERECTOMY;  Surgeon: Wellington Hampshire, MD;  Location: [place] CV LAB;  Service: Cardiovascular;  Laterality: N/A;  . INTRAVASCULAR ULTRASOUND/IVUS N/A 06/24/2020   Procedure: Intravascular Ultrasound/IVUS;  Surgeon: Wellington Hampshire, MD;  Location: [place] CV LAB;  Service: Cardiovascular;  Laterality: N/A;  . LEFT HEART CATH AND CORONARY ANGIOGRAPHY N/A 06/22/2020   Procedure: LEFT HEART  CATH AND CORONARY ANGIOGRAPHY;  Surgeon: Nelva Bush, MD;  Location: [place] CV LAB;  Service: Cardiovascular;  Laterality: N/A;  . ORIF ELBOW FRACTURE Right 04/10/2020   Procedure: OPEN REDUCTION INTERNAL FIXATION (ORIF) ELBOW/OLECRANON FRACTURE;  Surgeon: Marchia Bond, MD;  Location: [place];  Service: Orthopedics;  Laterality: Right;    Current Medications: Current Meds  Medication  Sig  . acetaminophen (TYLENOL) 325 MG tablet Take 2 tablets (650 mg total) by mouth every 6 (six) hours as needed. (Patient taking differently: Take 650 mg by mouth every 6 (six) hours as needed for mild pain or moderate pain.)  . albuterol (VENTOLIN HFA) 108 (90 Base) MCG/ACT inhaler INHALE 2 PUFFS INTO THE LUNGS EVERY 6 HOURS AS NEEDED FOR WHEEZING OR SHORTNESS OF BREATH (Patient taking differently: Inhale 2 puffs into the lungs every 6 (six) hours as needed for wheezing or shortness of breath. INHALE 2 PUFFS INTO THE LUNGS EVERY 6 HOURS AS NEEDED FOR WHEEZING OR SHORTNESS OF BREATH)  . alendronate (FOSAMAX) 70 MG tablet Take 70 mg by mouth once a week. Friday  . aspirin EC 81 MG EC tablet Take 1 tablet (81 mg total) by mouth daily. Swallow whole.  Marland Kitchen atorvastatin (LIPITOR) 80 MG tablet Take 1 tablet (80 mg total) by mouth daily.  . clopidogrel (PLAVIX) 75 MG tablet Take 1 tablet (75 mg total) by mouth daily.  . Cyanocobalamin (B-12) 1000 MCG TABS TAKE 1 TABLET (1,000 MCG TOTAL) BY MOUTH DAILY.  . ferrous sulfate (FERROUSUL) 325 (65 FE) MG tablet Take 1 tablet (325 mg total) by mouth daily with breakfast.  . ferrous sulfate 325 (65 FE) MG tablet TAKE 1 TABLET (325 MG TOTAL) BY MOUTH DAILY WITH BREAKFAST. (Patient taking differently: Take 325 mg by mouth daily with breakfast.)  . ipratropium-albuterol (DUONEB) 0.5-2.5 (3) MG/3ML SOLN Take 3 mLs by nebulization every 4 (four) hours as needed (shortness of breath or wheezing).  . Metoprolol Tartrate 37.5 MG TABS Take 37.5 mg by mouth 2 (two) times daily.  . Multiple Vitamins-Minerals (MULTIVITAMIN ADULT PO) Take 1 tablet by mouth daily. Unknown strength  . Nutritional Supplements (BOOST NUTRITIONAL ENERGY PO) Take 237 mLs by mouth in the morning and at bedtime. Drinking 1-2 per day  . SYNTHROID 150 MCG tablet TAKE 1 TABLET BY MOUTH DAILY BEFORE BREAKFAST. (Patient taking differently: Take 150 mcg by mouth daily before breakfast.)  . triamcinolone  (KENALOG) 0.1 % Apply 1 application topically 2 (two) times daily as needed (rash).  . [DISCONTINUED] ASPIRIN 81 PO Take 1 tablet by mouth daily.     Allergies:   Clarithromycin   Social History   Socioeconomic History  . Marital status: Married    Spouse name: Not on file  . Number of children: Not on file  . Years of education: Not on file  . Highest education level: Not on file  Occupational History  . Occupation: Retired    Fish farm manager: OTHER    Comment: Scientist, research (life sciences)  Tobacco Use  . Smoking status: Former Smoker    Types: Cigarettes    Quit date: 1960    Years since quitting: 62.3  . Smokeless tobacco: Former Systems developer    Types: [place] date: 2011  Vaping Use  . Vaping Use: Never used  Substance and Sexual Activity  . Alcohol use: No  . Drug use: No  . Sexual activity: Not Currently  Other Topics Concern  . Not on file  Social History Narrative  .  Not on file   Social Determinants of Health   Financial Resource Strain: Not on file  Food Insecurity: Not on file  Transportation Needs: No Transportation Needs  . Lack of Transportation (Medical): No  . Lack of Transportation (Non-Medical): No  Physical Activity: Sufficiently Active  . Days of Exercise per Week: 6 days  . Minutes of Exercise per Session: 30 min  Stress: Not on file  Social Connections: Not on file     Family History: The patient's family history includes Alzheimer's disease in his father; Diabetes in his mother. ROS:   Please see the history of present illness.    All 14 point review of systems negative except as described per history of present illness  EKGs/Labs/Other Studies Reviewed:      Recent Labs: 06/16/2020: ALT 13; TSH 1.360 06/29/2020: BUN 12; Creatinine, Ser 0.88; Potassium 4.9; Sodium 134 07/07/2020: Hemoglobin 9.6; Platelets 309  Recent Lipid Panel    Component Value Date/Time   CHOL 206 (H) 06/16/2020 1105   TRIG 113 06/16/2020 1105   HDL 49 06/16/2020 1105    CHOLHDL 4.2 06/16/2020 1105   LDLCALC 137 (H) 06/16/2020 1105    Physical Exam:    VS:  There were no vitals taken for this visit.    Wt Readings from Last 3 Encounters:  08/11/20 133 lb (60.3 kg)  07/29/20 133 lb (60.3 kg)  07/07/20 133 lb (60.3 kg)     GEN:  Well nourished, well developed in no acute distress HEENT: Normal NECK: No JVD; No carotid bruits LYMPHATICS: No lymphadenopathy CARDIAC: RRR, no murmurs, no rubs, no gallops RESPIRATORY:  Clear to auscultation without rales, wheezing or rhonchi  ABDOMEN: Soft, non-tender, non-distended MUSCULOSKELETAL:  No edema; No deformity  SKIN: Warm and dry LOWER EXTREMITIES: no swelling NEUROLOGIC:  Alert and oriented x 3 PSYCHIATRIC:  Normal affect   ASSESSMENT:    1. Coronary artery disease involving native coronary artery of native heart with unstable angina pectoris ([place])   2. PAF (paroxysmal atrial fibrillation) ([place])   3. Ventricular ectopy   4. Mixed hyperlipidemia    PLAN:    In order of problems listed above:  1. Coronary artery disease stable on appropriate medication which I will continue. 2. Paroxysmal atrial fibrillation seems to be maintaining sinus rhythm, he is on dual antiplatelet therapy but not anticoagulated secondary to significant anemia that she still have and fragility.  He is already on dual antiplatelet therapy. 3. Ventricular ectopy does not seems to bother him at all.  There is no dizziness no passing out. 4. Mixed dyslipidemia he is on high intense statin I did review his K PN before he was put on that medication his LDL was 137 with HDL of 49.  He is on 80 mg Lipitor we will check his cholesterol in 3 to 4 weeks.   Medication Adjustments/Labs and Tests Ordered: Current medicines are reviewed at length with the patient today.  Concerns regarding medicines are outlined above.  No orders of the defined types were placed in this encounter.  Medication changes: No orders of the defined types were  placed in this encounter.   Signed, Park Liter, MD, [facility] 08/11/2020 10:32 AM    [place]

## 2020-08-11 NOTE — Progress Notes (Signed)
Subjective:  Patient ID: James Iha Sr., male    DOB: Mar 30, 1932  Age: 87 y.o. MRN: [id number]  Chief Complaint  Patient presents with  . Myocardial infaction    HPI: patient has NSTEMI and no chest pain, he has physical therapy and is now back to his baseline. He has hearing difficulty. He is driving despite my warnings and son says he will not stop.  He is only driving around [place].  Patient is active outside now.   Current Outpatient Medications on File Prior to Visit  Medication Sig Dispense Refill  . acetaminophen (TYLENOL) 325 MG tablet Take 2 tablets (650 mg total) by mouth every 6 (six) hours as needed. 30 tablet 1  . albuterol (VENTOLIN HFA) 108 (90 Base) MCG/ACT inhaler INHALE 2 PUFFS INTO THE LUNGS EVERY 6 HOURS AS NEEDED FOR WHEEZING OR SHORTNESS OF BREATH (Patient taking differently: Inhale 2 puffs into the lungs every 6 (six) hours as needed for wheezing or shortness of breath. INHALE 2 PUFFS INTO THE LUNGS EVERY 6 HOURS AS NEEDED FOR WHEEZING OR SHORTNESS OF BREATH) 18 g 6  . alendronate (FOSAMAX) 70 MG tablet Take 70 mg by mouth once a week. Friday    . ASPIRIN 81 PO Take 1 tablet by mouth daily.    Marland Kitchen aspirin EC 81 MG EC tablet Take 1 tablet (81 mg total) by mouth daily. Swallow whole. 90 tablet 3  . atorvastatin (LIPITOR) 80 MG tablet Take 1 tablet (80 mg total) by mouth daily. 90 tablet 3  . clopidogrel (PLAVIX) 75 MG tablet Take 1 tablet (75 mg total) by mouth daily. 90 tablet 3  . Cyanocobalamin (B-12) 1000 MCG TABS TAKE 1 TABLET (1,000 MCG TOTAL) BY MOUTH DAILY. 30 tablet 0  . ferrous sulfate (FERROUSUL) 325 (65 FE) MG tablet Take 1 tablet (325 mg total) by mouth daily with breakfast. 30 tablet 12  . ferrous sulfate 325 (65 FE) MG tablet TAKE 1 TABLET (325 MG TOTAL) BY MOUTH DAILY WITH BREAKFAST. 30 tablet 12  . ipratropium-albuterol (DUONEB) 0.5-2.5 (3) MG/3ML SOLN Take 3 mLs by nebulization every 4 (four) hours as needed (shortness of breath or wheezing).     . Metoprolol Tartrate 37.5 MG TABS Take 37.5 mg by mouth 2 (two) times daily. 60 tablet 11  . Multiple Vitamins-Minerals (MULTIVITAMIN ADULT PO) Take 1 tablet by mouth daily.     . Nutritional Supplements (BOOST NUTRITIONAL ENERGY PO) Take 237 mLs by mouth in the morning and at bedtime. Drinking 1-2 per day    . SYNTHROID 150 MCG tablet TAKE 1 TABLET BY MOUTH DAILY BEFORE BREAKFAST. (Patient taking differently: Take 150 mcg by mouth daily before breakfast.) 90 tablet 2  . triamcinolone (KENALOG) 0.1 % Apply 1 application topically 2 (two) times daily as needed (rash).    . vitamin B-12 (CYANOCOBALAMIN) 1000 MCG tablet Take 1 tablet (1,000 mcg total) by mouth daily. 30 tablet 0   No current facility-administered medications on file prior to visit.   Past Medical History:  Diagnosis Date  . Acquired hypothyroidism 08/15/2019  . Acute systolic heart failure ([place]) 06/25/2020  . Anemia 02/05/2020   boarderline anemic  . Atopic dermatitis 12/05/2019  . Bacterial pneumonia, unspecified 06/14/2012   06/14/2012 CXR w/ RML PNA >Zithromax and Rocephin    . Bradycardia 11/28/2018  . CAD (coronary artery disease) 07/07/2020  . Cancer [facility])    prostate cancer-seed implant  . Closed fracture dislocation of right elbow   . Closed fracture  of right olecranon process 04/10/2020  . COPD (chronic obstructive pulmonary disease) ([place]) 01/21/2020  . Dermatitis 02/05/2020  . Dizziness 02/19/2016  . Essential hypertension 11/28/2018  . History of prostate cancer    seed implant  . Hyperlipemia   . Hyperlipidemia, mixed 08/21/2008   Qualifier: Diagnosis of  By: Annamaria Boots MD, [place] D   . LVH (left ventricular hypertrophy) 11/28/2018  . Malnutrition of moderate degree ([place]) 08/15/2019  . Mixed conductive and sensorineural hearing loss, 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  . Moderate persistent asthma, uncomplicated 2/59/5638  . Nonsustained ventricular tachycardia ([place]) 01/30/2019  . NSTEMI (non-ST elevated myocardial infarction) ([place])  06/21/2020  . Other fatigue 02/05/2020  . PAF (paroxysmal atrial fibrillation) ([place]) 06/25/2020  . Seasonal and perennial allergic rhinitis 05/17/2007      . Senile osteoporosis 08/15/2019  . SINUSITIS, ACUTE 06/28/2007   Qualifier: Diagnosis of  By: Royal Piedra NP, Tammy    . Ventricular ectopy 11/28/2018   Past Surgical History:  Procedure Laterality Date  . APPENDECTOMY    . CORONARY ATHERECTOMY N/A 06/24/2020   Procedure: CORONARY ATHERECTOMY;  Surgeon: Wellington Hampshire, MD;  Location: [place] CV LAB;  Service: Cardiovascular;  Laterality: N/A;  . INTRAVASCULAR ULTRASOUND/IVUS N/A 06/24/2020   Procedure: Intravascular Ultrasound/IVUS;  Surgeon: Wellington Hampshire, MD;  Location: [place] CV LAB;  Service: Cardiovascular;  Laterality: N/A;  . LEFT HEART CATH AND CORONARY ANGIOGRAPHY N/A 06/22/2020   Procedure: LEFT HEART CATH AND CORONARY ANGIOGRAPHY;  Surgeon: Nelva Bush, MD;  Location: [place] CV LAB;  Service: Cardiovascular;  Laterality: N/A;  . ORIF ELBOW FRACTURE Right 04/10/2020   Procedure: OPEN REDUCTION INTERNAL FIXATION (ORIF) ELBOW/OLECRANON FRACTURE;  Surgeon: Marchia Bond, MD;  Location: [place];  Service: Orthopedics;  Laterality: Right;    Family History  Problem Relation Age of Onset  . Alzheimer's disease Father   . Diabetes Mother    Social History   Socioeconomic History  . Marital status: Married    Spouse name: Not on file  . Number of children: Not on file  . Years of education: Not on file  . Highest education level: Not on file  Occupational History  . Occupation: Retired    Fish farm manager: OTHER    Comment: Scientist, research (life sciences)  Tobacco Use  . Smoking status: Former Smoker    Types: Cigarettes    Quit date: 1960    Years since quitting: 62.3  . Smokeless tobacco: Former Systems developer    Types:  date: 2011  Vaping Use  . Vaping Use: Never used  Substance and Sexual Activity  . Alcohol use: No  . Drug use: No  . Sexual  activity: Not Currently  Other Topics Concern  . Not on file  Social History Narrative  . Not on file   Social Determinants of Health   Financial Resource Strain: Not on file  Food Insecurity: Not on file  Transportation Needs: No Transportation Needs  . Lack of Transportation (Medical): No  . Lack of Transportation (Non-Medical): No  Physical Activity: Sufficiently Active  . Days of Exercise per Week: 6 days  . Minutes of Exercise per Session: 30 min  Stress: Not on file  Social Connections: Not on file    Review of Systems  Constitutional: Negative for activity change and appetite change.  HENT: Negative for congestion and sinus pain.   Eyes: Negative for visual disturbance.  Respiratory: Negative for chest tightness and shortness of breath.   Cardiovascular: Negative for chest  pain, palpitations and leg swelling.  Gastrointestinal: Negative for abdominal distention and abdominal pain.  Endocrine: Negative for polyuria.  Genitourinary: Negative for difficulty urinating and dysuria.  Musculoskeletal: Negative for arthralgias and back pain.  Skin: Negative.   Neurological: Negative.   Psychiatric/Behavioral: Negative.      Objective:  BP 138/78   Pulse 68   Temp (!) 97.2 F (36.2 C)   Resp 16   Ht 6\' 1"  (1.854 m)   Wt 133 lb (60.3 kg)   SpO2 99%   BMI 17.55 kg/m   BP/Weight 08/11/2020 8/54/6270 07/12/91  Systolic BP 818 299 371  Diastolic BP 78 58 64  Wt. (Lbs) 133 133 133  BMI 17.55 17.55 18.04    Physical Exam Vitals reviewed.  Constitutional:      Appearance: Normal appearance. He is normal weight.  HENT:     Right Ear: Tympanic membrane normal.     Left Ear: Tympanic membrane normal.  Eyes:     Extraocular Movements: Extraocular movements intact.     Conjunctiva/sclera: Conjunctivae normal.     Pupils: Pupils are equal, round, and reactive to light.  Cardiovascular:     Rate and Rhythm: Normal rate and regular rhythm.     Pulses: Normal pulses.      Heart sounds: Normal heart sounds. No murmur heard. No gallop.   Pulmonary:     Effort: Pulmonary effort is normal. No respiratory distress.     Breath sounds: Normal breath sounds. No rales.  Abdominal:     General: Abdomen is flat. Bowel sounds are normal. There is no distension.     Palpations: Abdomen is soft.     Tenderness: There is no abdominal tenderness.  Musculoskeletal:        General: Normal range of motion.     Cervical back: Normal range of motion and neck supple.  Skin:    General: Skin is warm.     Capillary Refill: Capillary refill takes less than 2 seconds.  Neurological:     General: No focal deficit present.     Mental Status: He is alert.       Lab Results  Component Value Date   WBC 9.6 07/07/2020   HGB 9.6 (L) 07/07/2020   HCT 30.4 (L) 07/07/2020   PLT 309 07/07/2020   GLUCOSE 82 06/29/2020   CHOL 206 (H) 06/16/2020   TRIG 113 06/16/2020   HDL 49 06/16/2020   LDLCALC 137 (H) 06/16/2020   ALT 13 06/16/2020   AST 18 06/16/2020   NA 134 06/29/2020   K 4.9 06/29/2020   CL 99 06/29/2020   CREATININE 0.88 06/29/2020   BUN 12 06/29/2020   CO2 21 06/29/2020   TSH 1.360 06/16/2020   HGBA1C 6.3 (H) 06/22/2020      Assessment & Plan:   1. Cancer [facility]) Patient has a long history of prostate cancer about 15 years ago, he has implants still present.  We discussed urology consult.  2. Coronary artery disease involving native coronary artery of native heart with unstable angina pectoris [facility]) Patient has not had any chest pain or dyspnea since his stent.     I spent 25 minutes with patient and reviewed hospital records    Follow-up: Return if symptoms worsen or fail to improve.  An After Visit Summary was printed and given to the patient.  Reinaldo Meeker, MD [facility] [phone]

## 2020-08-25 ENCOUNTER — Ambulatory Visit: Payer: Medicare HMO | Admitting: Cardiology

## 2020-09-02 ENCOUNTER — Telehealth: Payer: Self-pay

## 2020-09-02 NOTE — Chronic Care Management (AMB) (Signed)
Chronic Care Management Pharmacy Assistant   Name: James Cashman Sr.  MRN: [id number] DOB: [birth year]   Reason for Encounter: Disease State for hypertension    Recent office visits:  08/11/20-cardiology, no medication changes, follow up 4 mos  08/11/20-Dr. Henrene Pastor PCP- no medication changes, urology referral   07/29/20- pulmonology, Ok to continue present meds, and to add an otc antihistamine and Flonase if needed, Order- referral to [facility] ENT ( the group with Dr Redmond Baseman and Dr Wilburn Cornelia)  For hoarse since MI  Recent consult visits:  none  Hospital visits:  06/21/20-ED/admission- Afib, left heart cath   Medications: Outpatient Encounter Medications as of 09/02/2020  Medication Sig  . acetaminophen (TYLENOL) 325 MG tablet Take 2 tablets (650 mg total) by mouth every 6 (six) hours as needed. (Patient taking differently: Take 650 mg by mouth every 6 (six) hours as needed for mild pain or moderate pain.)  . albuterol (VENTOLIN HFA) 108 (90 Base) MCG/ACT inhaler INHALE 2 PUFFS INTO THE LUNGS EVERY 6 HOURS AS NEEDED FOR WHEEZING OR SHORTNESS OF BREATH (Patient taking differently: Inhale 2 puffs into the lungs every 6 (six) hours as needed for wheezing or shortness of breath. INHALE 2 PUFFS INTO THE LUNGS EVERY 6 HOURS AS NEEDED FOR WHEEZING OR SHORTNESS OF BREATH)  . alendronate (FOSAMAX) 70 MG tablet Take 70 mg by mouth once a week. Friday  . aspirin EC 81 MG EC tablet Take 1 tablet (81 mg total) by mouth daily. Swallow whole.  Marland Kitchen atorvastatin (LIPITOR) 80 MG tablet Take 1 tablet (80 mg total) by mouth daily.  . clopidogrel (PLAVIX) 75 MG tablet Take 1 tablet (75 mg total) by mouth daily.  . Cyanocobalamin (B-12) 1000 MCG TABS TAKE 1 TABLET (1,000 MCG TOTAL) BY MOUTH DAILY.  . ferrous sulfate (FERROUSUL) 325 (65 FE) MG tablet Take 1 tablet (325 mg total) by mouth daily with breakfast.  . ferrous sulfate 325 (65 FE) MG tablet TAKE 1 TABLET (325 MG TOTAL) BY MOUTH DAILY WITH BREAKFAST.  (Patient taking differently: Take 325 mg by mouth daily with breakfast.)  . ipratropium-albuterol (DUONEB) 0.5-2.5 (3) MG/3ML SOLN Take 3 mLs by nebulization every 4 (four) hours as needed (shortness of breath or wheezing).  . Metoprolol Tartrate 37.5 MG TABS Take 37.5 mg by mouth 2 (two) times daily.  . Multiple Vitamins-Minerals (MULTIVITAMIN ADULT PO) Take 1 tablet by mouth daily. Unknown strength  . Nutritional Supplements (BOOST NUTRITIONAL ENERGY PO) Take 237 mLs by mouth in the morning and at bedtime. Drinking 1-2 per day  . SYNTHROID 150 MCG tablet TAKE 1 TABLET BY MOUTH DAILY BEFORE BREAKFAST. (Patient taking differently: Take 150 mcg by mouth daily before breakfast.)  . triamcinolone (KENALOG) 0.1 % Apply 1 application topically 2 (two) times daily as needed (rash).   No facility-administered encounter medications on file as of 09/02/2020.     Recent Office Vitals: BP Readings from Last 3 Encounters:  08/11/20 120/80  08/11/20 138/78  07/29/20 (!) 100/58   Pulse Readings from Last 3 Encounters:  08/11/20 63  08/11/20 68  07/29/20 63    Wt Readings from Last 3 Encounters:  08/11/20 133 lb (60.3 kg)  08/11/20 133 lb (60.3 kg)  07/29/20 133 lb (60.3 kg)     Kidney Function Lab Results  Component Value Date/Time   CREATININE 0.88 06/29/2020 12:00 AM   CREATININE 0.92 06/25/2020 04:45 AM   GFRNONAA 78 06/29/2020 12:00 AM   GFRNONAA >60 06/25/2020 04:45 AM   GFRAA  90 06/29/2020 12:00 AM    BMP Latest Ref Rng & Units 06/29/2020 06/25/2020 06/24/2020  Glucose 65 - 99 mg/dL 82 108(H) 85  BUN 8 - 27 mg/dL 12 16 12   Creatinine 0.76 - 1.27 mg/dL 0.88 0.92 0.89  BUN/Creat Ratio 10 - 24 14 - -  Sodium 134 - 144 mmol/L 134 134(L) 136  Potassium 3.5 - 5.2 mmol/L 4.9 3.5 3.5  Chloride 96 - 106 mmol/L 99 106 107  CO2 20 - 29 mmol/L 21 18(L) 19(L)  Calcium 8.6 - 10.2 mg/dL 8.9 8.2(L) 8.3(L)     . Current antihypertensive regimen:  Metoprolol  Patient verbally confirms he is  taking the above medications as directed. Yes, daughter [name] fixes his medications.   . How often are you checking your Blood Pressure?  Spoke to daughter [name], she stated her step mom only checks if he feels bad, but will tell her to start checking more often since he has started some new medications with his heart attack.   . Current home BP readings: No recent readings  . Wrist or arm cuff:  [name] has used both in past . Caffeine intake: morning coffee . Salt intake:  [name] stated she tries to stress better eating habits, she said another family member makes the meals, she does his medications.   . OTC medications including pseudoephedrine or NSAIDs? Tylenol as needed, takes low dose ASA  . Any readings above 180/120? No  . What recent interventions/DTPs have been made by any provider to improve Blood Pressure control since last CPP Visit:  [name], stated he is taking his medications as directed, he is set up for repeat blood work next week.   He has not had to use his nebulizer as much. He is finished with physical therapy since his MI.   Marland Kitchen Any recent hospitalizations or ED visits since last visit with CPP? No  . What diet changes have been made to improve Blood Pressure Control?  [name] is trying to stress better food choices, less fried foods, he is taking his medications as directed.    . What exercise is being done to improve your Blood Pressure Control?  [name] states he stays very active  Adherence Review: Is the patient currently on ACE/ARB medication? Yes Does the patient have >5 day gap between last estimated fill dates? CPP to review   Star Rating Drugs:   Atorvastatin            09/01/20      90ds Clopidogrel             06/25/20     90ds Metoprolol              07/18/20      [place], [place] Pharmacist Assistant [phone]

## 2020-09-03 ENCOUNTER — Other Ambulatory Visit (HOSPITAL_COMMUNITY): Payer: Self-pay

## 2020-09-08 DIAGNOSIS — E782 Mixed hyperlipidemia: Secondary | ICD-10-CM | POA: Diagnosis not present

## 2020-09-08 DIAGNOSIS — I493 Ventricular premature depolarization: Secondary | ICD-10-CM | POA: Diagnosis not present

## 2020-09-08 DIAGNOSIS — I25110 Atherosclerotic heart disease of native coronary artery with unstable angina pectoris: Secondary | ICD-10-CM | POA: Diagnosis not present

## 2020-09-08 DIAGNOSIS — I480 Paroxysmal atrial fibrillation: Secondary | ICD-10-CM | POA: Diagnosis not present

## 2020-09-09 LAB — LIPID PANEL
Chol/HDL Ratio: 2.9 ratio (ref 0.0–5.0)
Cholesterol, Total: 123 mg/dL (ref 100–199)
HDL: 43 mg/dL (ref >39)
LDL Chol Calc (NIH): 64 mg/dL (ref 0–99)
Triglycerides: 82 mg/dL (ref 0–149)
VLDL Cholesterol Cal: 16 mg/dL (ref 5–40)

## 2020-09-09 LAB — HEPATIC FUNCTION PANEL
ALT: 22 IU/L (ref 0–44)
AST: 24 IU/L (ref 0–40)
Albumin: 4.1 g/dL (ref 3.6–4.6)
Alkaline Phosphatase: 68 IU/L (ref 44–121)
Bilirubin Total: 0.3 mg/dL (ref 0.0–1.2)
Bilirubin, Direct: <0.1 mg/dL (ref 0.00–0.40)
Total Protein: 6.2 g/dL (ref 6.0–8.5)

## 2020-09-11 ENCOUNTER — Other Ambulatory Visit: Payer: Self-pay

## 2020-09-11 MED ORDER — CLOPIDOGREL BISULFATE 75 MG PO TABS: 75.0000 mg | ORAL_TABLET | Freq: Every day | ORAL | 3 refills | Status: DC

## 2020-09-30 DIAGNOSIS — S52021D Displaced fracture of olecranon process without intraarticular extension of right ulna, subsequent encounter for closed fracture with routine healing: Secondary | ICD-10-CM | POA: Diagnosis not present

## 2020-10-19 ENCOUNTER — Telehealth: Payer: Self-pay

## 2020-10-19 NOTE — Progress Notes (Signed)
Chronic Care Management Pharmacy Assistant   Name: Ketih Goodie Sr.  MRN: [id number] DOB: [birth year]   Reason for Encounter: Disease State   Conditions to be addressed/monitored: HTN  Recent office visits:  09/08/20-Labs, Cholesterol looks perfect, continue present management  08/11/20-cardiology, no medication changes, follow up 4 mos   08/11/20-Dr. Henrene Pastor PCP- no medication changes, urology referral   07/29/20- pulmonology, Ok to continue present meds, and to add an otc antihistamine and Flonase if needed,  Order- referral to [facility] ENT ( the group with Dr Redmond Baseman and Dr Wilburn Cornelia)  For hoarse since MI  Recent consult visits:  None  Hospital visits:  06/21/20-ED/admission- Afib, left heart cath   Medications: Outpatient Encounter Medications as of 10/19/2020  Medication Sig   acetaminophen (TYLENOL) 325 MG tablet Take 2 tablets (650 mg total) by mouth every 6 (six) hours as needed. (Patient taking differently: Take 650 mg by mouth every 6 (six) hours as needed for mild pain or moderate pain.)   albuterol (VENTOLIN HFA) 108 (90 Base) MCG/ACT inhaler INHALE 2 PUFFS INTO THE LUNGS EVERY 6 HOURS AS NEEDED FOR WHEEZING OR SHORTNESS OF BREATH (Patient taking differently: Inhale 2 puffs into the lungs every 6 (six) hours as needed for wheezing or shortness of breath. INHALE 2 PUFFS INTO THE LUNGS EVERY 6 HOURS AS NEEDED FOR WHEEZING OR SHORTNESS OF BREATH)   alendronate (FOSAMAX) 70 MG tablet Take 70 mg by mouth once a week. Friday   aspirin EC 81 MG EC tablet Take 1 tablet (81 mg total) by mouth daily. Swallow whole.   atorvastatin (LIPITOR) 80 MG tablet Take 1 tablet (80 mg total) by mouth daily.   clopidogrel (PLAVIX) 75 MG tablet Take 1 tablet (75 mg total) by mouth daily.   Cyanocobalamin (B-12) 1000 MCG TABS TAKE 1 TABLET (1,000 MCG TOTAL) BY MOUTH DAILY.   ferrous sulfate (FERROUSUL) 325 (65 FE) MG tablet Take 1 tablet (325 mg total) by mouth daily with breakfast.   ferrous  sulfate 325 (65 FE) MG tablet TAKE 1 TABLET (325 MG TOTAL) BY MOUTH DAILY WITH BREAKFAST. (Patient taking differently: Take 325 mg by mouth daily with breakfast.)   ipratropium-albuterol (DUONEB) 0.5-2.5 (3) MG/3ML SOLN Take 3 mLs by nebulization every 4 (four) hours as needed (shortness of breath or wheezing).   Metoprolol Tartrate 37.5 MG TABS Take 37.5 mg by mouth 2 (two) times daily.   Multiple Vitamins-Minerals (MULTIVITAMIN ADULT PO) Take 1 tablet by mouth daily. Unknown strength   Nutritional Supplements (BOOST NUTRITIONAL ENERGY PO) Take 237 mLs by mouth in the morning and at bedtime. Drinking 1-2 per day   SYNTHROID 150 MCG tablet TAKE 1 TABLET BY MOUTH DAILY BEFORE BREAKFAST. (Patient taking differently: Take 150 mcg by mouth daily before breakfast.)   triamcinolone (KENALOG) 0.1 % Apply 1 application topically 2 (two) times daily as needed (rash).   No facility-administered encounter medications on file as of 10/19/2020.    Recent Office Vitals: BP Readings from Last 3 Encounters:  08/11/20 120/80  08/11/20 138/78  07/29/20 (!) 100/58   Pulse Readings from Last 3 Encounters:  08/11/20 63  08/11/20 68  07/29/20 63    Wt Readings from Last 3 Encounters:  08/11/20 133 lb (60.3 kg)  08/11/20 133 lb (60.3 kg)  07/29/20 133 lb (60.3 kg)     Kidney Function Lab Results  Component Value Date/Time   CREATININE 0.88 06/29/2020 12:00 AM   CREATININE 0.92 06/25/2020 04:45 AM   GFRNONAA 78 06/29/2020  12:00 AM   GFRNONAA >60 06/25/2020 04:45 AM   GFRAA 90 06/29/2020 12:00 AM    BMP Latest Ref Rng & Units 06/29/2020 06/25/2020 06/24/2020  Glucose 65 - 99 mg/dL 82 108(H) 85  BUN 8 - 27 mg/dL 12 16 12   Creatinine 0.76 - 1.27 mg/dL 0.88 0.92 0.89  BUN/Creat Ratio 10 - 24 14 - -  Sodium 134 - 144 mmol/L 134 134(L) 136  Potassium 3.5 - 5.2 mmol/L 4.9 3.5 3.5  Chloride 96 - 106 mmol/L 99 106 107  CO2 20 - 29 mmol/L 21 18(L) 19(L)  Calcium 8.6 - 10.2 mg/dL 8.9 8.2(L) 8.3(L)      Current antihypertensive regimen:  Metoprolol   Patient verbally confirms he is taking the above medications as directed. Yes  How often are you checking your Blood Pressure? infrequently  Wrist or arm cuff: Has used both in past  Caffeine intake: Has morning coffee Salt intake: Per daughter [name], he is trying to do better OTC medications including pseudoephedrine or NSAIDs? Takes low dose aspirin daily  Any readings above 180/120? No  What recent interventions/DTPs have been made by any provider to improve Blood Pressure control since last CPP Visit: Patient takes his medicaiton, daughter [name] does those for him.  Patient doe not take his blood pressure daily, its only if he feels bad.   What diet changes have been made to improve Blood Pressure Control?  [name] said, she tries to stress food choices, said they had some fresh home cooked vegetable soup last week.   [name] stated its a situation of having to pick your battles, and he is set in his ways.  What exercise is being done to improve your Blood Pressure Control?   Patient is very active   Adherence Review: Is the patient currently on ACE/ARB medication? Yes Does the patient have >5 day gap between last estimated fill dates? CPP to review  Care Gaps: Last annual wellness visit? 04/23/20-cancelled appointment   Star Rating Drugs:  Medication:  Last Fill: Day Supp Atorvastatin  09/01/20 90 Clopidogrel  09/11/20  90 Synthroid  07/30/20 90 Metoprolol  10/14/20  [place], [place] Pharmacist Assistant [phone]

## 2020-10-30 DIAGNOSIS — D649 Anemia, unspecified: Secondary | ICD-10-CM | POA: Diagnosis not present

## 2020-10-30 DIAGNOSIS — I4891 Unspecified atrial fibrillation: Secondary | ICD-10-CM | POA: Diagnosis not present

## 2020-10-30 DIAGNOSIS — I25119 Atherosclerotic heart disease of native coronary artery with unspecified angina pectoris: Secondary | ICD-10-CM | POA: Diagnosis not present

## 2020-10-30 DIAGNOSIS — E785 Hyperlipidemia, unspecified: Secondary | ICD-10-CM | POA: Diagnosis not present

## 2020-10-30 DIAGNOSIS — E039 Hypothyroidism, unspecified: Secondary | ICD-10-CM | POA: Diagnosis not present

## 2020-10-30 DIAGNOSIS — J45909 Unspecified asthma, uncomplicated: Secondary | ICD-10-CM | POA: Diagnosis not present

## 2020-10-30 DIAGNOSIS — K08409 Partial loss of teeth, unspecified cause, unspecified class: Secondary | ICD-10-CM | POA: Diagnosis not present

## 2020-10-30 DIAGNOSIS — M1990 Unspecified osteoarthritis, unspecified site: Secondary | ICD-10-CM | POA: Diagnosis not present

## 2020-10-30 DIAGNOSIS — I252 Old myocardial infarction: Secondary | ICD-10-CM | POA: Diagnosis not present

## 2020-10-30 DIAGNOSIS — D6869 Other thrombophilia: Secondary | ICD-10-CM | POA: Diagnosis not present

## 2020-11-14 ENCOUNTER — Encounter: Payer: Self-pay | Admitting: Internal Medicine

## 2020-11-14 DIAGNOSIS — R490 Dysphonia: Secondary | ICD-10-CM | POA: Insufficient documentation

## 2020-11-14 NOTE — Assessment & Plan Note (Signed)
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

## 2020-11-14 NOTE — Assessment & Plan Note (Signed)
No recent exacerbation Plan- continue current meds

## 2020-12-15 ENCOUNTER — Ambulatory Visit (INDEPENDENT_AMBULATORY_CARE_PROVIDER_SITE_OTHER): Payer: Medicare HMO | Admitting: Legal Medicine

## 2020-12-15 ENCOUNTER — Other Ambulatory Visit: Payer: Self-pay

## 2020-12-15 ENCOUNTER — Encounter: Payer: Self-pay | Admitting: Legal Medicine

## 2020-12-15 VITALS — BP 144/80 | HR 79 | Temp 97.6°F | Resp 15 | Ht 73.0 in | Wt 138.0 lb

## 2020-12-15 DIAGNOSIS — I10 Essential (primary) hypertension: Secondary | ICD-10-CM | POA: Diagnosis not present

## 2020-12-15 DIAGNOSIS — I480 Paroxysmal atrial fibrillation: Secondary | ICD-10-CM | POA: Diagnosis not present

## 2020-12-15 DIAGNOSIS — E440 Moderate protein-calorie malnutrition: Secondary | ICD-10-CM

## 2020-12-15 DIAGNOSIS — J4540 Moderate persistent asthma, uncomplicated: Secondary | ICD-10-CM

## 2020-12-15 DIAGNOSIS — E782 Mixed hyperlipidemia: Secondary | ICD-10-CM

## 2020-12-15 DIAGNOSIS — I25110 Atherosclerotic heart disease of native coronary artery with unstable angina pectoris: Secondary | ICD-10-CM | POA: Diagnosis not present

## 2020-12-15 DIAGNOSIS — E039 Hypothyroidism, unspecified: Secondary | ICD-10-CM | POA: Diagnosis not present

## 2020-12-15 DIAGNOSIS — H906 Mixed conductive and sensorineural hearing loss, bilateral: Secondary | ICD-10-CM

## 2020-12-15 NOTE — Progress Notes (Addendum)
Established Patient Office Visit  Subjective:  Patient ID: James Senn Sr., male    DOB: 11/18/1931  Age: 87 y.o. MRN: [id number]  CC:  Chief Complaint  Patient presents with   Hypothyroidism   Hyperlipidemia   Hypertension    HPI James Iha Sr. presents for chronic visit  Patient presents for follow up of hypertension.  Patient tolerating metoprolol well with side effects.  Patient was diagnosed with hypertension 2010 so has been treated for hypertension for 10 years.Patient is working on maintaining diet and exercise regimen and follows up as directed. Complication include none.   Patient presents with hyperlipidemia.  Compliance with treatment has been good; patient takes medicines as directed, maintains low cholesterol diet, follows up as directed, and maintains exercise regimen.  Patient is using atorvastin without problems.   Patient has HYPOTHYROIDISM.  Diagnosed 10 years ago.  Patient has stable thyroid readings.  Patient is having no symptoms.  Last TSH was normal.  continue dosage of thyroid medicine.   Past Medical History:  Diagnosis Date   Acquired hypothyroidism [id number]   Acute systolic heart failure ([place]) 06/25/2020   Anemia 02/05/2020   boarderline anemic   Atopic dermatitis 12/05/2019   Bacterial pneumonia, unspecified 06/14/2012   06/14/2012 CXR w/ RML PNA >Zithromax and Rocephin     Bradycardia 11/28/2018   CAD (coronary artery disease) 07/07/2020   Cancer ([place])    prostate cancer-seed implant   Closed fracture dislocation of right elbow    Closed fracture of right olecranon process 04/10/2020   COPD (chronic obstructive pulmonary disease) ([place]) 01/21/2020   Dermatitis 02/05/2020   Dizziness 02/19/2016   Essential hypertension 11/28/2018   History of prostate cancer    seed implant   Hyperlipemia    Hyperlipidemia, mixed 08/21/2008   Qualifier: Diagnosis of  By: Annamaria Boots MD, Clinton D    LVH (left ventricular hypertrophy) 11/28/2018   Malnutrition of  moderate degree ([place]) 08/15/2019   Mixed conductive and sensorineural hearing loss, 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   Moderate persistent asthma, uncomplicated [id number]   Nonsustained ventricular tachycardia ([place]) 01/30/2019   NSTEMI (non-ST elevated myocardial infarction) ([place]) 06/21/2020   Other fatigue 02/05/2020   PAF (paroxysmal atrial fibrillation) ([place]) 06/25/2020   Seasonal and perennial allergic rhinitis 05/17/2007       Senile osteoporosis 08/15/2019   SINUSITIS, ACUTE 06/28/2007   Qualifier: Diagnosis of  By: Royal Piedra NP, Tammy     Ventricular ectopy 11/28/2018    Past Surgical History:  Procedure Laterality Date   APPENDECTOMY     CORONARY ATHERECTOMY N/A 06/24/2020   Procedure: CORONARY ATHERECTOMY;  Surgeon: Wellington Hampshire, MD;  Location: [facility];  Service: Cardiovascular;  Laterality: N/A;   INTRAVASCULAR ULTRASOUND/IVUS N/A 06/24/2020   Procedure: Intravascular Ultrasound/IVUS;  Surgeon: Wellington Hampshire, MD;  Location: [facility];  Service: Cardiovascular;  Laterality: N/A;   LEFT HEART CATH AND CORONARY ANGIOGRAPHY N/A 06/22/2020   Procedure: LEFT HEART CATH AND CORONARY ANGIOGRAPHY;  Surgeon: Nelva Bush, MD;  Location: [facility];  Service: Cardiovascular;  Laterality: N/A;   ORIF ELBOW FRACTURE Right 04/10/2020   Procedure: OPEN REDUCTION INTERNAL FIXATION (ORIF) ELBOW/OLECRANON FRACTURE;  Surgeon: Marchia Bond, MD;  Location: [facility];  Service: Orthopedics;  Laterality: Right;    Family History  Problem Relation Age of Onset   Alzheimer's disease Father    Diabetes Mother     Social History   Socioeconomic History   Marital status: Married  Spouse name: Not on file   Number of children: Not on file   Years of education: Not on file   Highest education level: Not on file  Occupational History   Occupation: Retired    Fish farm manager: OTHER    Comment: Scientist, research (life sciences)  Tobacco Use   Smoking status: Former    Types:  Cigarettes    Quit date: 1960    Years since quitting: 62.6   Smokeless tobacco: Former    Types: Chew    Quit date: 2011  Vaping Use   Vaping Use: Never used  Substance and Sexual Activity   Alcohol use: No   Drug use: No   Sexual activity: Not Currently  Other Topics Concern   Not on file  Social History Narrative   Not on file   Social Determinants of Health   Financial Resource Strain: Not on file  Food Insecurity: Not on file  Transportation Needs: Not on file  Physical Activity: Not on file  Stress: Not on file  Social Connections: Not on file  Intimate Partner Violence: Not on file    Outpatient Medications Prior to Visit  Medication Sig Dispense Refill   acetaminophen (TYLENOL) 325 MG tablet Take 2 tablets (650 mg total) by mouth every 6 (six) hours as needed. (Patient taking differently: Take 650 mg by mouth every 6 (six) hours as needed for mild pain or moderate pain.) 30 tablet 1   albuterol (VENTOLIN HFA) 108 (90 Base) MCG/ACT inhaler INHALE 2 PUFFS INTO THE LUNGS EVERY 6 HOURS AS NEEDED FOR WHEEZING OR SHORTNESS OF BREATH (Patient taking differently: Inhale 2 puffs into the lungs every 6 (six) hours as needed for wheezing or shortness of breath. INHALE 2 PUFFS INTO THE LUNGS EVERY 6 HOURS AS NEEDED FOR WHEEZING OR SHORTNESS OF BREATH) 18 g 6   alendronate (FOSAMAX) 70 MG tablet Take 70 mg by mouth once a week. Friday     aspirin EC 81 MG EC tablet Take 1 tablet (81 mg total) by mouth daily. Swallow whole. 90 tablet 3   atorvastatin (LIPITOR) 80 MG tablet Take 1 tablet (80 mg total) by mouth daily. 90 tablet 3   clopidogrel (PLAVIX) 75 MG tablet Take 1 tablet (75 mg total) by mouth daily. 90 tablet 3   Cyanocobalamin (B-12) 1000 MCG TABS TAKE 1 TABLET (1,000 MCG TOTAL) BY MOUTH DAILY. 30 tablet 0   ferrous sulfate (FERROUSUL) 325 (65 FE) MG tablet Take 1 tablet (325 mg total) by mouth daily with breakfast. 30 tablet 12   ferrous sulfate 325 (65 FE) MG tablet TAKE 1  TABLET (325 MG TOTAL) BY MOUTH DAILY WITH BREAKFAST. (Patient taking differently: Take 325 mg by mouth daily with breakfast.) 30 tablet 12   ipratropium-albuterol (DUONEB) 0.5-2.5 (3) MG/3ML SOLN Take 3 mLs by nebulization every 4 (four) hours as needed (shortness of breath or wheezing).     Metoprolol Tartrate 37.5 MG TABS Take 37.5 mg by mouth 2 (two) times daily. 60 tablet 11   Multiple Vitamins-Minerals (MULTIVITAMIN ADULT PO) Take 1 tablet by mouth daily. Unknown strength     Nutritional Supplements (BOOST NUTRITIONAL ENERGY PO) Take 237 mLs by mouth in the morning and at bedtime. Drinking 1-2 per day     SYNTHROID 150 MCG tablet TAKE 1 TABLET BY MOUTH DAILY BEFORE BREAKFAST. (Patient taking differently: Take 150 mcg by mouth daily before breakfast.) 90 tablet 2   triamcinolone (KENALOG) 0.1 % Apply 1 application topically 2 (two) times daily as needed (  rash).     No facility-administered medications prior to visit.    Allergies  Allergen Reactions   Clarithromycin     REACTION: GI upset    ROS Review of Systems  Constitutional:  Negative for chills, fatigue and fever.  HENT:  Negative for congestion, ear pain and sore throat.   Respiratory:  Negative for cough and shortness of breath.   Cardiovascular:  Negative for chest pain.  Gastrointestinal:  Negative for abdominal pain, constipation, diarrhea, nausea and vomiting.  Endocrine: Negative for polydipsia, polyphagia and polyuria.  Genitourinary:  Negative for dysuria and frequency.  Musculoskeletal:  Negative for arthralgias and myalgias.  Neurological:  Negative for dizziness and headaches.  Psychiatric/Behavioral:  Negative for dysphoric mood.        No dysphoria     Objective:    Physical Exam Vitals reviewed.  Constitutional:      General: He is not in acute distress.    Appearance: Normal appearance.  HENT:     Head: Normocephalic.     Right Ear: Tympanic membrane, ear canal and external ear normal.     Left Ear:  Tympanic membrane, ear canal and external ear normal.     Mouth/Throat:     Mouth: Mucous membranes are moist.  Eyes:     Conjunctiva/sclera: Conjunctivae normal.     Pupils: Pupils are equal, round, and reactive to light.  Cardiovascular:     Rate and Rhythm: Normal rate and regular rhythm.     Pulses: Normal pulses.     Heart sounds: Normal heart sounds. No murmur heard.   No gallop.  Pulmonary:     Effort: Pulmonary effort is normal. No respiratory distress.     Breath sounds: Normal breath sounds. No wheezing.  Abdominal:     General: Abdomen is flat. Bowel sounds are normal. There is no distension.     Palpations: Abdomen is soft.     Tenderness: There is no abdominal tenderness.  Musculoskeletal:     Cervical back: Normal range of motion.     Comments: Muscle wasting  Skin:    General: Skin is warm.     Capillary Refill: Capillary refill takes less than 2 seconds.     Findings: Bruising present.  Neurological:     General: No focal deficit present.     Mental Status: He is alert and oriented to person, place, and time. Mental status is at baseline.  Psychiatric:        Mood and Affect: Mood normal.        Thought Content: Thought content normal.        Judgment: Judgment normal.    BP (!) 144/80   Pulse 79   Temp 97.6 F (36.4 C)   Resp 15   Ht '6\' 1"'$  (1.854 m)   Wt 138 lb (62.6 kg)   SpO2 98%   BMI 18.21 kg/m  Wt Readings from Last 3 Encounters:  12/15/20 138 lb (62.6 kg)  08/11/20 133 lb (60.3 kg)  08/11/20 133 lb (60.3 kg)     Health Maintenance Due  Topic Date Due   Zoster Vaccines- Shingrix (1 of 2) Never done   COVID-19 Vaccine (4 - Booster for Pfizer series) 05/30/2020   INFLUENZA VACCINE  12/07/2020    There are no preventive care reminders to display for this patient.  Lab Results  Component Value Date   TSH 1.360 06/16/2020   Lab Results  Component Value Date   WBC 9.6 07/07/2020  HGB 9.6 (L) 07/07/2020   HCT 30.4 (L) 07/07/2020    MCV 86 07/07/2020   PLT 309 07/07/2020   Lab Results  Component Value Date   NA 134 06/29/2020   K 4.9 06/29/2020   CO2 21 06/29/2020   GLUCOSE 82 06/29/2020   BUN 12 06/29/2020   CREATININE 0.88 06/29/2020   BILITOT 0.3 09/08/2020   ALKPHOS 68 09/08/2020   AST 24 09/08/2020   ALT 22 09/08/2020   PROT 6.2 09/08/2020   ALBUMIN 4.1 09/08/2020   CALCIUM 8.9 06/29/2020   ANIONGAP 10 06/25/2020   Lab Results  Component Value Date   CHOL 123 09/08/2020   Lab Results  Component Value Date   HDL 43 09/08/2020   Lab Results  Component Value Date   LDLCALC 64 09/08/2020   Lab Results  Component Value Date   TRIG 82 09/08/2020   Lab Results  Component Value Date   CHOLHDL 2.9 09/08/2020   Lab Results  Component Value Date   HGBA1C 6.3 (H) 06/22/2020      Assessment & Plan:   Problem List Items Addressed This Visit       Cardiovascular and Mediastinum   Essential hypertension   Relevant Orders   Comprehensive metabolic panel   CBC with Differential/Platelet An individual hypertension care plan was established and reinforced today.  The patient's status was assessed using clinical findings on exam and labs or diagnostic tests. The patient's success at meeting treatment goals on disease specific evidence-based guidelines and found to be well controlled. SELF MANAGEMENT: The patient and I together assessed ways to personally work towards obtaining the recommended goals. RECOMMENDATIONS: avoid decongestants found in common cold remedies, decrease consumption of alcohol, perform routine monitoring of BP with home BP cuff, exercise, reduction of dietary salt, take medicines as prescribed, try not to miss doses and quit smoking.  Regular exercise and maintaining a healthy weight is needed.  Stress reduction may help. A CLINICAL SUMMARY including written plan identify barriers to care unique to individual due to social or financial issues.  We attempt to mutually creat  solutions for individual and family understanding.     PAF (paroxysmal atrial fibrillation) ([place]) Patient has a diagnosis of chronic atrial fibrillation.   Patient is on eliquis and has controled ventricular response.  Patient is CV stable .    CAD (coronary artery disease) An individual plan was formulated based on patient history and exam, labs and evidence based data. Patient has not had recent angina or nitroglycerin use. continue present treatment.      Respiratory   Moderate persistent asthma, uncomplicated This patient has asthma moderate and is on duoneb.  Patient is not having a flair.  Chronic medicines include duoneb. Addition new medicines none.  Asthma action plan is in place.       Nervous and Auditory   Mixed conductive and sensorineural hearing loss, bilateral Patient has bilateral hearing loss and using hearing aids     Other   Hyperlipidemia, mixed - Primary   Relevant Orders   Lipid panel AN INDIVIDUAL CARE PLAN for hyperlipidemia/ cholesterol was established and reinforced today.  The patient's status was assessed using clinical findings on exam, lab and other diagnostic tests. The patient's disease status was assessed based on evidence-based guidelines and found to be fair controlled. MEDICATIONS were reviewed. SELF MANAGEMENT GOALS have been discussed and patient's success at attaining the goal of low cholesterol was assessed. RECOMMENDATION given include regular exercise 3 days a week  and low cholesterol/low fat diet. CLINICAL SUMMARY including written plan to identify barriers unique to the patient due to social or economic  reasons was discussed.     Malnutrition of moderate degree (H[facility]) Supplement nutrition with protein/calorie supplement with meals to improve nutritional status.    Other Visit Diagnoses     Primary hypothyroidism       Relevant Orders   TSH Patient is known to have hypothyroidism and is n treatment with levothyroxine 149mg.  Patient was  diagnosed 10 years ago.  Other treatment includes none.  Patient is compliant with medicines and last TSH 6 months ago.  Last TSH was normal.       Visit 30 minutes with review of old records    Follow-up: Return in about 4 months (around 04/16/2021) for fasting.    LReinaldo Meeker MD

## 2020-12-16 ENCOUNTER — Other Ambulatory Visit: Payer: Self-pay

## 2020-12-16 ENCOUNTER — Other Ambulatory Visit: Payer: Self-pay | Admitting: Legal Medicine

## 2020-12-16 DIAGNOSIS — E039 Hypothyroidism, unspecified: Secondary | ICD-10-CM

## 2020-12-16 LAB — COMPREHENSIVE METABOLIC PANEL
ALT: 22 IU/L (ref 0–44)
AST: 28 IU/L (ref 0–40)
Albumin/Globulin Ratio: 2 (ref 1.2–2.2)
Albumin: 4.4 g/dL (ref 3.6–4.6)
Alkaline Phosphatase: 73 IU/L (ref 44–121)
BUN/Creatinine Ratio: 17 (ref 10–24)
BUN: 16 mg/dL (ref 8–27)
Bilirubin Total: 0.3 mg/dL (ref 0.0–1.2)
CO2: 22 mmol/L (ref 20–29)
Calcium: 9.4 mg/dL (ref 8.6–10.2)
Chloride: 102 mmol/L (ref 96–106)
Creatinine, Ser: 0.92 mg/dL (ref 0.76–1.27)
Globulin, Total: 2.2 g/dL (ref 1.5–4.5)
Glucose: 98 mg/dL (ref 65–99)
Potassium: 4.4 mmol/L (ref 3.5–5.2)
Sodium: 137 mmol/L (ref 134–144)
Total Protein: 6.6 g/dL (ref 6.0–8.5)
eGFR: 81 mL/min/{1.73_m2} (ref >59)

## 2020-12-16 LAB — CBC WITH DIFFERENTIAL/PLATELET
Basophils Absolute: 0.1 10*3/uL (ref 0.0–0.2)
Basos: 1 %
EOS (ABSOLUTE): 0.7 10*3/uL — ABNORMAL HIGH (ref 0.0–0.4)
Eos: 8 %
Hematocrit: 36.2 % — ABNORMAL LOW (ref 37.5–51.0)
Hemoglobin: 11.8 g/dL — ABNORMAL LOW (ref 13.0–17.7)
Immature Grans (Abs): 0.1 10*3/uL (ref 0.0–0.1)
Immature Granulocytes: 1 %
Lymphocytes Absolute: 1.5 10*3/uL (ref 0.7–3.1)
Lymphs: 16 %
MCH: 29.8 pg (ref 26.6–33.0)
MCHC: 32.6 g/dL (ref 31.5–35.7)
MCV: 91 fL (ref 79–97)
Monocytes Absolute: 1.2 10*3/uL — ABNORMAL HIGH (ref 0.1–0.9)
Monocytes: 13 %
Neutrophils Absolute: 5.8 10*3/uL (ref 1.4–7.0)
Neutrophils: 61 %
Platelets: 209 10*3/uL (ref 150–450)
RBC: 3.96 x10E6/uL — ABNORMAL LOW (ref 4.14–5.80)
RDW: 12.4 % (ref 11.6–15.4)
WBC: 9.3 10*3/uL (ref 3.4–10.8)

## 2020-12-16 LAB — LIPID PANEL
Chol/HDL Ratio: 2.7 ratio (ref 0.0–5.0)
Cholesterol, Total: 127 mg/dL (ref 100–199)
HDL: 47 mg/dL (ref >39)
LDL Chol Calc (NIH): 64 mg/dL (ref 0–99)
Triglycerides: 80 mg/dL (ref 0–149)
VLDL Cholesterol Cal: 16 mg/dL (ref 5–40)

## 2020-12-16 LAB — TSH: TSH: 13 u[IU]/mL — ABNORMAL HIGH (ref 0.450–4.500)

## 2020-12-16 LAB — CARDIOVASCULAR RISK ASSESSMENT

## 2020-12-16 MED ORDER — LEVOTHYROXINE SODIUM 175 MCG PO TABS: 175.0000 ug | ORAL_TABLET | Freq: Every day | ORAL | 3 refills | Status: DC

## 2020-12-16 NOTE — Progress Notes (Signed)
Kidney and liver tests normal, TSH 13 high- has he stopped thyroid medicine?, cholesterol normal, CBC hemoglobin and hematocrit rising toward normal,  lp

## 2020-12-16 NOTE — Progress Notes (Signed)
Changed to 175 dose and recheck TSH in 6 weeks lp

## 2020-12-18 ENCOUNTER — Telehealth: Payer: Self-pay

## 2020-12-18 NOTE — Chronic Care Management (AMB) (Signed)
Error

## 2020-12-23 ENCOUNTER — Telehealth: Payer: Self-pay | Admitting: Cardiology

## 2020-12-23 NOTE — Telephone Encounter (Signed)
   Patient Name: James Rausa Sr.  DOB: [birth year] MRN: [id number]  Primary Cardiologist: [name], MD  Chart reviewed as part of pre-operative protocol coverage.   Request received for 5 dental extractions. I was able to connect with his daughter [name] who explained they were not planning to have all extractions completed at one time.   If extractions are spaced out, simple extractions of 1-2 teeth are considered low risk procedures per guidelines and generally do not require any specific cardiac clearance. It is also generally accepted that for simple extractions and dental cleanings, there is no need to interrupt blood thinner therapy.  Given the above, he does have an appt with his cardiologist on 01/15/21. They wish to discuss further with him about proceeding with dental work. I will ask Dr. Agustin Cree to complete clearance and fax the requesting office.   SBE prophylaxis is not required for the patient from a cardiac standpoint.  I will route this recommendation to the requesting party via Epic fax function and remove from pre-op pool.  Please call with questions.  Tami Lin Hibba Schram, PA 12/23/2020, 11:55 AM

## 2020-12-23 NOTE — Telephone Encounter (Signed)
   [facility] Pre-operative Risk Assessment    Patient Name: James Sedore Sr.  DOB: Apr 13, 1932 MRN: [id number]  HEARTCARE STAFF:  - IMPORTANT!!!!!! Under Visit Info/Reason for Call, type in Other and utilize the format Clearance MM/DD/YY or Clearance TBD. Do not use dashes or single digits. - Please review there is not already an duplicate clearance open for this procedure. - If request is for dental extraction, please clarify the # of teeth to be extracted. - If the patient is currently at the dentist's office, call Pre-Op Callback Staff (MA/nurse) to input urgent request.  - If the patient is not currently in the dentist office, please route to the Pre-Op pool.  Request for surgical clearance:  What type of surgery is being performed? 5 extractions   When is this surgery scheduled? Waiting on the doctor  What type of clearance is required (medical clearance vs. Pharmacy clearance to hold med vs. Both)? Not sure  Are there any medications that need to be held prior to surgery and how long? Not sure  Practice name and name of physician performing surgery? [facility]  What is the office phone number? [phone]   7.   What is the office fax number? [phone]  8.   Anesthesia type (None, local, MAC, general) ? Local    Jeanelle L Green 12/23/2020, 9:33 AM  _________________________________________________________________   (provider comments below)

## 2020-12-31 ENCOUNTER — Other Ambulatory Visit: Payer: Self-pay | Admitting: Legal Medicine

## 2021-01-15 ENCOUNTER — Ambulatory Visit: Payer: Medicare HMO | Admitting: Cardiology

## 2021-01-15 ENCOUNTER — Other Ambulatory Visit: Payer: Self-pay

## 2021-01-15 ENCOUNTER — Encounter: Payer: Self-pay | Admitting: Cardiology

## 2021-01-15 VITALS — BP 108/62 | HR 67 | Ht 72.0 in | Wt 139.2 lb

## 2021-01-15 DIAGNOSIS — I480 Paroxysmal atrial fibrillation: Secondary | ICD-10-CM

## 2021-01-15 DIAGNOSIS — I10 Essential (primary) hypertension: Secondary | ICD-10-CM | POA: Diagnosis not present

## 2021-01-15 DIAGNOSIS — I25110 Atherosclerotic heart disease of native coronary artery with unstable angina pectoris: Secondary | ICD-10-CM

## 2021-01-15 DIAGNOSIS — E782 Mixed hyperlipidemia: Secondary | ICD-10-CM | POA: Diagnosis not present

## 2021-01-15 DIAGNOSIS — I493 Ventricular premature depolarization: Secondary | ICD-10-CM | POA: Diagnosis not present

## 2021-01-15 NOTE — Patient Instructions (Signed)

## 2021-01-15 NOTE — Progress Notes (Signed)
Cardiology Office Note:    Date:  01/15/2021   ID:  James Iha Sr., DOB Dec 20, 1931, MRN [id number]  PCP:  [name], MD  Cardiologist:  [name], MD    Referring MD: [name],*   Chief Complaint  Patient presents with   Clearance 01/19/2021    History of Present Illness:    James Hunsicker Sr. is a 87 y.o. male   who was initially referred to [place] because of sinus bradycardia, nonsustained ventricular tachycardia.  Work-up however has been negative that included stress test done more than a year ago.  Recently he ended up having some chest pain he went to [facility] he was find to be in atrial fibrillation with fast ventricular rate he also got some spell of troponin.  After that cardiac catheterization has been done which showed critical more than 90% lesion in the mid LAD.  Eventually he did have staged procedures to open up the artery he required Rotablator and of this lesion and stenting.  He comes today to my office for follow-up after that.  He was also found to be in atrial fibrillation he was put on Cardizem and converted spontaneously, he is not anticoagulated. He comes today to my office for follow-up overall he seems to be doing well.  Denies have any chest pain tightness squeezing pressure burning chest.  He required dental work he required 5 teeth to be removed.  And the question to me was this is safe to do it no overall from cardiac point of view should be fine to perform it.  Apparently dentist said that if we maintain dual antiplatelet therapy done 2 teeth will be removed and then couple weeks later another 2 or 3 will be removed.  I do think that will be the best approach to it.  Overall cardiac wise he seems to be doing well.  Stable and compensated  Past Medical History:  Diagnosis Date   Acquired hypothyroidism [id number]   Acute systolic heart failure ([place]) 06/25/2020   Anemia 02/05/2020   boarderline anemic   Atopic dermatitis  12/05/2019   Bacterial pneumonia, unspecified 06/14/2012   06/14/2012 CXR w/ RML PNA >Zithromax and Rocephin     Bradycardia 11/28/2018   CAD (coronary artery disease) 07/07/2020   Cancer ([place])    prostate cancer-seed implant   Closed fracture dislocation of right elbow    Closed fracture of right olecranon process 04/10/2020   COPD (chronic obstructive pulmonary disease) ([place]) 01/21/2020   Dermatitis 02/05/2020   Dizziness 02/19/2016   Essential hypertension 11/28/2018   History of prostate cancer    seed implant   Hyperlipemia    Hyperlipidemia, mixed 08/21/2008   Qualifier: Diagnosis of  By: Annamaria Boots MD, Clinton D    LVH (left ventricular hypertrophy) 11/28/2018   Malnutrition of moderate degree ([place]) 08/15/2019   Mixed conductive and sensorineural hearing loss, 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   Moderate persistent asthma, uncomplicated [id number]   Nonsustained ventricular tachycardia ([place]) 01/30/2019   NSTEMI (non-ST elevated myocardial infarction) ([place]) 06/21/2020   Other fatigue 02/05/2020   PAF (paroxysmal atrial fibrillation) ([place]) 06/25/2020   Seasonal and perennial allergic rhinitis 05/17/2007       Senile osteoporosis 08/15/2019   SINUSITIS, ACUTE 06/28/2007   Qualifier: Diagnosis of  By: Royal Piedra NP, Tammy     Ventricular ectopy 11/28/2018    Past Surgical History:  Procedure Laterality Date   APPENDECTOMY     CORONARY ATHERECTOMY N/A 06/24/2020   Procedure: CORONARY ATHERECTOMY;  Surgeon: Wellington Hampshire, MD;  Location: [facility];  Service: Cardiovascular;  Laterality: N/A;   INTRAVASCULAR ULTRASOUND/IVUS N/A 06/24/2020   Procedure: Intravascular Ultrasound/IVUS;  Surgeon: Wellington Hampshire, MD;  Location: [facility];  Service: Cardiovascular;  Laterality: N/A;   LEFT HEART CATH AND CORONARY ANGIOGRAPHY N/A 06/22/2020   Procedure: LEFT HEART CATH AND CORONARY ANGIOGRAPHY;  Surgeon: Nelva Bush, MD;  Location: [facility];  Service: Cardiovascular;  Laterality: N/A;   ORIF  ELBOW FRACTURE Right 04/10/2020   Procedure: OPEN REDUCTION INTERNAL FIXATION (ORIF) ELBOW/OLECRANON FRACTURE;  Surgeon: Marchia Bond, MD;  Location: [facility];  Service: Orthopedics;  Laterality: Right;    Current Medications: Current Meds  Medication Sig   acetaminophen (TYLENOL) 325 MG tablet Take 2 tablets (650 mg total) by mouth every 6 (six) hours as needed. (Patient taking differently: Take 650 mg by mouth every 6 (six) hours as needed for mild pain or moderate pain.)   albuterol (VENTOLIN HFA) 108 (90 Base) MCG/ACT inhaler INHALE 2 PUFFS INTO THE LUNGS EVERY 6 HOURS AS NEEDED FOR WHEEZING OR SHORTNESS OF BREATH (Patient taking differently: Inhale 2 puffs into the lungs every 6 (six) hours as needed for wheezing or shortness of breath. INHALE 2 PUFFS INTO THE LUNGS EVERY 6 HOURS AS NEEDED FOR WHEEZING OR SHORTNESS OF BREATH)   alendronate (FOSAMAX) 70 MG tablet 1 TABLET BY MOUTH WEEKLY IN THE MORNING, AT LEAST 30 MIN BEFORE FIRST FOOD/BEVERAGE/MEDS OF DAY (Patient taking differently: Take 70 mg by mouth once a week.)   aspirin EC 81 MG EC tablet Take 1 tablet (81 mg total) by mouth daily. Swallow whole.   atorvastatin (LIPITOR) 80 MG tablet Take 1 tablet (80 mg total) by mouth daily.   clopidogrel (PLAVIX) 75 MG tablet Take 1 tablet (75 mg total) by mouth daily.   Cyanocobalamin (B-12) 1000 MCG TABS TAKE 1 TABLET (1,000 MCG TOTAL) BY MOUTH DAILY.   ferrous sulfate (FERROUSUL) 325 (65 FE) MG tablet Take 1 tablet (325 mg total) by mouth daily with breakfast.   ferrous sulfate 325 (65 FE) MG tablet TAKE 1 TABLET (325 MG TOTAL) BY MOUTH DAILY WITH BREAKFAST. (Patient taking differently: Take 325 mg by mouth daily with breakfast.)   ipratropium-albuterol (DUONEB) 0.5-2.5 (3) MG/3ML SOLN Take 3 mLs by nebulization every 4 (four) hours as needed (shortness of breath or wheezing).   levothyroxine (SYNTHROID) 175 MCG tablet Take 1 tablet (175 mcg total) by mouth daily before  breakfast.   Metoprolol Tartrate 37.5 MG TABS Take 37.5 mg by mouth 2 (two) times daily.   Multiple Vitamins-Minerals (MULTIVITAMIN ADULT PO) Take 1 tablet by mouth daily. Unknown strength   Nutritional Supplements (BOOST NUTRITIONAL ENERGY PO) Take 237 mLs by mouth in the morning and at bedtime. Drinking 1-2 per day   triamcinolone (KENALOG) 0.1 % Apply 1 application topically 2 (two) times daily as needed (rash).     Allergies:   Clarithromycin   Social History   Socioeconomic History   Marital status: Married    Spouse name: Not on file   Number of children: Not on file   Years of education: Not on file   Highest education level: Not on file  Occupational History   Occupation: Retired    Fish farm manager: OTHER    Comment: [employer]  Tobacco Use   Smoking status: Former    Types: Cigarettes    Quit date: 1960    Years since quitting: 34.7  Smokeless tobacco: Former    Types: Chew    Quit date: 2011  Vaping Use   Vaping Use: Never used  Substance and Sexual Activity   Alcohol use: No   Drug use: No   Sexual activity: Not Currently  Other Topics Concern   Not on file  Social History Narrative   Not on file   Social Determinants of Health   Financial Resource Strain: Not on file  Food Insecurity: Not on file  Transportation Needs: Not on file  Physical Activity: Not on file  Stress: Not on file  Social Connections: Not on file     Family History: The patient's family history includes Alzheimer's disease in his father; Diabetes in his mother. ROS:   Please see the history of present illness.    All 14 point review of systems negative except as described per history of present illness  EKGs/Labs/Other Studies Reviewed:      Recent Labs: 12/15/2020: ALT 22; BUN 16; Creatinine, Ser 0.92; Hemoglobin 11.8; Platelets 209; Potassium 4.4; Sodium 137; TSH 13.000  Recent Lipid Panel    Component Value Date/Time   CHOL 127 12/15/2020 1030   TRIG 80 12/15/2020  1030   HDL 47 12/15/2020 1030   CHOLHDL 2.7 12/15/2020 1030   LDLCALC 64 12/15/2020 1030    Physical Exam:    VS:  BP 108/62 (BP Location: Right Arm, Patient Position: Sitting)   Pulse 67   Ht 6' (1.829 m)   Wt 139 lb 3.2 oz (63.1 kg)   SpO2 98%   BMI 18.88 kg/m     Wt Readings from Last 3 Encounters:  01/15/21 139 lb 3.2 oz (63.1 kg)  12/15/20 138 lb (62.6 kg)  08/11/20 133 lb (60.3 kg)     GEN:  Well nourished, well developed in no acute distress HEENT: Normal NECK: No JVD; No carotid bruits LYMPHATICS: No lymphadenopathy CARDIAC: RRR, no murmurs, no rubs, no gallops RESPIRATORY:  Clear to auscultation without rales, wheezing or rhonchi  ABDOMEN: Soft, non-tender, non-distended MUSCULOSKELETAL:  No edema; No deformity  SKIN: Warm and dry LOWER EXTREMITIES: no swelling NEUROLOGIC:  Alert and oriented x 3 PSYCHIATRIC:  Normal affect   ASSESSMENT:    1. Coronary artery disease involving native coronary artery of native heart with unstable angina pectoris ([place])   2. Essential hypertension   3. Ventricular ectopy   4. Hyperlipidemia, mixed   5. PAF (paroxysmal atrial fibrillation) ([facility])    PLAN:    In order of problems listed above:  Coronary disease stable from that point review on dual antiplatelet therapy which we will continue for a year Essential hypertension blood pressure well controlled continue present management. Ventricular ectopy: Stable denies having any. Dyslipidemia: I did review his K PN which show me his LDL of 64 HDL 47 this is from 12/15/2020, we will continue present management which include high intensity statin form of Lipitor 80. Cardiovascular evaluation before dental procedure.  I do not see any contraindication from heart point of view to perform a tooth extraction.  However ideally we should maintain dual antiplatelet therapy.  If at that site not visible from dentist point of view we either can wait until a year anniversary of his stent  implantation which probably will be most desirable in this scenario.   Medication Adjustments/Labs and Tests Ordered: Current medicines are reviewed at length with the patient today.  Concerns regarding medicines are outlined above.  No orders of the defined types were placed in this  encounter.  Medication changes: No orders of the defined types were placed in this encounter.   Signed, Park Liter, MD, [facility] 01/15/2021 11:22 AM    [place]

## 2021-01-22 ENCOUNTER — Telehealth: Payer: Self-pay

## 2021-01-22 NOTE — Chronic Care Management (AMB) (Signed)
Chronic Care Management Pharmacy Assistant   Name: James Buchberger Sr.  MRN: [id number] DOB: 12-03-1931   Reason for Encounter: Disease State/ Hypertension  Recent office visits:  12-15-2020 [name], MD. RBC= 3.96, Hemo= 11.8, Hema= 36.2, Monocytes= 1.2, EOS absolute= 0.7. TSH= 13.000  Recent consult visits:  01-15-2021 Park Liter, MD (Cardiology). EKG placed  Hospital visits:  None in previous 6 months  Medications: Outpatient Encounter Medications as of 01/22/2021  Medication Sig   acetaminophen (TYLENOL) 325 MG tablet Take 2 tablets (650 mg total) by mouth every 6 (six) hours as needed. (Patient taking differently: Take 650 mg by mouth every 6 (six) hours as needed for mild pain or moderate pain.)   albuterol (VENTOLIN HFA) 108 (90 Base) MCG/ACT inhaler INHALE 2 PUFFS INTO THE LUNGS EVERY 6 HOURS AS NEEDED FOR WHEEZING OR SHORTNESS OF BREATH (Patient taking differently: Inhale 2 puffs into the lungs every 6 (six) hours as needed for wheezing or shortness of breath. INHALE 2 PUFFS INTO THE LUNGS EVERY 6 HOURS AS NEEDED FOR WHEEZING OR SHORTNESS OF BREATH)   alendronate (FOSAMAX) 70 MG tablet 1 TABLET BY MOUTH WEEKLY IN THE MORNING, AT LEAST 30 MIN BEFORE FIRST FOOD/BEVERAGE/MEDS OF DAY (Patient taking differently: Take 70 mg by mouth once a week.)   aspirin EC 81 MG EC tablet Take 1 tablet (81 mg total) by mouth daily. Swallow whole.   atorvastatin (LIPITOR) 80 MG tablet Take 1 tablet (80 mg total) by mouth daily.   clopidogrel (PLAVIX) 75 MG tablet Take 1 tablet (75 mg total) by mouth daily.   Cyanocobalamin (B-12) 1000 MCG TABS TAKE 1 TABLET (1,000 MCG TOTAL) BY MOUTH DAILY.   ferrous sulfate (FERROUSUL) 325 (65 FE) MG tablet Take 1 tablet (325 mg total) by mouth daily with breakfast.   ferrous sulfate 325 (65 FE) MG tablet TAKE 1 TABLET (325 MG TOTAL) BY MOUTH DAILY WITH BREAKFAST. (Patient taking differently: Take 325 mg by mouth daily with breakfast.)    ipratropium-albuterol (DUONEB) 0.5-2.5 (3) MG/3ML SOLN Take 3 mLs by nebulization every 4 (four) hours as needed (shortness of breath or wheezing).   levothyroxine (SYNTHROID) 175 MCG tablet Take 1 tablet (175 mcg total) by mouth daily before breakfast.   Metoprolol Tartrate 37.5 MG TABS Take 37.5 mg by mouth 2 (two) times daily.   Multiple Vitamins-Minerals (MULTIVITAMIN ADULT PO) Take 1 tablet by mouth daily. Unknown strength   Nutritional Supplements (BOOST NUTRITIONAL ENERGY PO) Take 237 mLs by mouth in the morning and at bedtime. Drinking 1-2 per day   triamcinolone (KENALOG) 0.1 % Apply 1 application topically 2 (two) times daily as needed (rash).   No facility-administered encounter medications on file as of 01/22/2021.    Recent Office Vitals: BP Readings from Last 3 Encounters:  01/15/21 108/62  12/15/20 (!) 144/80  08/11/20 120/80   Pulse Readings from Last 3 Encounters:  01/15/21 67  12/15/20 79  08/11/20 63    Wt Readings from Last 3 Encounters:  01/15/21 139 lb 3.2 oz (63.1 kg)  12/15/20 138 lb (62.6 kg)  08/11/20 133 lb (60.3 kg)     Kidney Function Lab Results  Component Value Date/Time   CREATININE 0.92 12/15/2020 10:30 AM   CREATININE 0.88 06/29/2020 12:00 AM   GFRNONAA 78 06/29/2020 12:00 AM   GFRNONAA >60 06/25/2020 04:45 AM   GFRAA 90 06/29/2020 12:00 AM    BMP Latest Ref Rng & Units 12/15/2020 06/29/2020 06/25/2020  Glucose 65 - 99  mg/dL 98 82 108(H)  BUN 8 - 27 mg/dL '16 12 16  '$ Creatinine 0.76 - 1.27 mg/dL 0.92 0.88 0.92  BUN/Creat Ratio 10 - '24 17 14 '$ -  Sodium 134 - 144 mmol/L 137 134 134(L)  Potassium 3.5 - 5.2 mmol/L 4.4 4.9 3.5  Chloride 96 - 106 mmol/L 102 99 106  CO2 20 - 29 mmol/L 22 21 18(L)  Calcium 8.6 - 10.2 mg/dL 9.4 8.9 8.2(L)     Current antihypertensive regimen:  Metoprolol 37.5 mg 3 times daily  Patient verbally confirms he is taking the above medications as directed. Yes  How often are you checking your Blood Pressure?  infrequently  Current home BP readings: Patient's daughter states he rarely checks blood pressure at home mainly at doctor visits.  DATE:             BP               PULSE 01-15-2021  108/62  67   Wrist or arm cuff: Arm cuff Caffeine intake: 2 cups of coffee Salt intake: limited OTC medications including pseudoephedrine or NSAIDs? Patient's daughter stated none  Any readings above 180/120? No   What recent interventions/DTPs have been made by any provider to improve Blood Pressure control since last CPP Visit:  Check BP weekly, document, and provide at future appointments Ensure daily salt intake < 2300 mg/day  Any recent hospitalizations or ED visits since last visit with CPP? No  What diet changes have been made to improve Blood Pressure Control?  Patient's daughter states he drinks water and eats vegetables/ fruits often.  What exercise is being done to improve your Blood Pressure Control?  Patient's daughter states he is active daily outside daily  Adherence Review: Is the patient currently on ACE/ARB medication? No Does the patient have >5 day gap between last estimated fill dates? CPP to review  Care Gaps: Last annual wellness visit? 04-23-2020 but canceled due to surgery. Patient's daughter will schedule appointment on 01-25-2021 at his lab visit.  Shingrix overdue  Star Rating Drugs:  Medication:  Last Fill: Day Supply Atorvastatin 80 mg 12-07-2020 [place] Clinical Pharmacist Assistant [phone]

## 2021-01-25 ENCOUNTER — Other Ambulatory Visit: Payer: Medicare HMO

## 2021-01-25 ENCOUNTER — Other Ambulatory Visit: Payer: Self-pay

## 2021-01-25 DIAGNOSIS — E039 Hypothyroidism, unspecified: Secondary | ICD-10-CM

## 2021-01-26 LAB — TSH: TSH: 3.87 u[IU]/mL (ref 0.450–4.500)

## 2021-01-26 NOTE — Progress Notes (Signed)
TSH is now 3.87 normal range, continue present dose lp

## 2021-01-29 ENCOUNTER — Ambulatory Visit (INDEPENDENT_AMBULATORY_CARE_PROVIDER_SITE_OTHER): Payer: Medicare HMO

## 2021-01-29 VITALS — BP 109/56 | HR 61 | Ht 73.0 in | Wt 139.0 lb

## 2021-01-29 DIAGNOSIS — Z0000 Encounter for general adult medical examination without abnormal findings: Secondary | ICD-10-CM | POA: Diagnosis not present

## 2021-01-29 NOTE — Progress Notes (Signed)
Subjective:   James Overbaugh Sr. is a 87 y.o. male who presents for Medicare Annual/Subsequent preventive examination.  I connected with  [name] on 01/29/21 by a Audio enabled telemedicine application and verified that I am speaking with the correct person using two identifiers.   I discussed the limitations of evaluation and management by telemedicine. The patient expressed understanding and agreed to proceed.   Location of Patient: Home  Location of Provider: [place] participating in visit: [name] (patient), [name] (patient daughter) and Philipp Ovens, CMA  Review of Systems    Defer to PCP   Patient would like Hep C screening and shingrix at next appointment with Dr. Henrene Pastor. Patient will go to pharmacy to for flu shot.    Objective:    There were no vitals filed for this visit. There is no height or weight on file to calculate BMI.  Advanced Directives 04/10/2020 04/06/2020 12/31/2014  Does Patient Have a Medical Advance Directive? No No No  Would patient like information on creating a medical advance directive? No - Patient declined No - Patient declined -    Current Medications (verified) Outpatient Encounter Medications as of 01/29/2021  Medication Sig   acetaminophen (TYLENOL) 325 MG tablet Take 2 tablets (650 mg total) by mouth every 6 (six) hours as needed. (Patient taking differently: Take 650 mg by mouth every 6 (six) hours as needed for mild pain or moderate pain.)   albuterol (VENTOLIN HFA) 108 (90 Base) MCG/ACT inhaler INHALE 2 PUFFS INTO THE LUNGS EVERY 6 HOURS AS NEEDED FOR WHEEZING OR SHORTNESS OF BREATH (Patient taking differently: Inhale 2 puffs into the lungs every 6 (six) hours as needed for wheezing or shortness of breath. INHALE 2 PUFFS INTO THE LUNGS EVERY 6 HOURS AS NEEDED FOR WHEEZING OR SHORTNESS OF BREATH)   alendronate (FOSAMAX) 70 MG tablet 1 TABLET BY MOUTH WEEKLY IN THE MORNING, AT LEAST 30 MIN BEFORE FIRST  FOOD/BEVERAGE/MEDS OF DAY (Patient taking differently: Take 70 mg by mouth once a week.)   aspirin EC 81 MG EC tablet Take 1 tablet (81 mg total) by mouth daily. Swallow whole.   atorvastatin (LIPITOR) 80 MG tablet Take 1 tablet (80 mg total) by mouth daily.   clopidogrel (PLAVIX) 75 MG tablet Take 1 tablet (75 mg total) by mouth daily.   Cyanocobalamin (B-12) 1000 MCG TABS TAKE 1 TABLET (1,000 MCG TOTAL) BY MOUTH DAILY.   ferrous sulfate (FERROUSUL) 325 (65 FE) MG tablet Take 1 tablet (325 mg total) by mouth daily with breakfast.   ferrous sulfate 325 (65 FE) MG tablet TAKE 1 TABLET (325 MG TOTAL) BY MOUTH DAILY WITH BREAKFAST. (Patient taking differently: Take 325 mg by mouth daily with breakfast.)   ipratropium-albuterol (DUONEB) 0.5-2.5 (3) MG/3ML SOLN Take 3 mLs by nebulization every 4 (four) hours as needed (shortness of breath or wheezing).   levothyroxine (SYNTHROID) 175 MCG tablet Take 1 tablet (175 mcg total) by mouth daily before breakfast.   Metoprolol Tartrate 37.5 MG TABS Take 37.5 mg by mouth 2 (two) times daily.   Multiple Vitamins-Minerals (MULTIVITAMIN ADULT PO) Take 1 tablet by mouth daily. Unknown strength   Nutritional Supplements (BOOST NUTRITIONAL ENERGY PO) Take 237 mLs by mouth in the morning and at bedtime. Drinking 1-2 per day   triamcinolone (KENALOG) 0.1 % Apply 1 application topically 2 (two) times daily as needed (rash).   No facility-administered encounter medications on file as of 01/29/2021.    Allergies (verified)  Clarithromycin   History: Past Medical History:  Diagnosis Date   Acquired hypothyroidism 08/15/6757   Acute systolic heart failure ([place]) 06/25/2020   Anemia 02/05/2020   boarderline anemic   Atopic dermatitis 12/05/2019   Bacterial pneumonia, unspecified 06/14/2012   06/14/2012 CXR w/ RML PNA >Zithromax and Rocephin     Bradycardia 11/28/2018   CAD (coronary artery disease) 07/07/2020   Cancer ([place])    prostate cancer-seed implant   Closed fracture  dislocation of right elbow    Closed fracture of right olecranon process 04/10/2020   COPD (chronic obstructive pulmonary disease) ([place]) 01/21/2020   Dermatitis 02/05/2020   Dizziness 02/19/2016   Essential hypertension 11/28/2018   History of prostate cancer    seed implant   Hyperlipemia    Hyperlipidemia, mixed 08/21/2008   Qualifier: Diagnosis of  By: Annamaria Boots MD, Clinton D    LVH (left ventricular hypertrophy) 11/28/2018   Malnutrition of moderate degree ([place]) 08/15/2019   Mixed conductive and sensorineural hearing loss, 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   Moderate persistent asthma, uncomplicated 1/63/8466   Nonsustained ventricular tachycardia ([place]) 01/30/2019   NSTEMI (non-ST elevated myocardial infarction) ([place]) 06/21/2020   Other fatigue 02/05/2020   PAF (paroxysmal atrial fibrillation) ([place]) 06/25/2020   Seasonal and perennial allergic rhinitis 05/17/2007       Senile osteoporosis 08/15/2019   SINUSITIS, ACUTE 06/28/2007   Qualifier: Diagnosis of  By: Royal Piedra NP, Tammy     Ventricular ectopy 11/28/2018   Past Surgical History:  Procedure Laterality Date   APPENDECTOMY     CORONARY ATHERECTOMY N/A 06/24/2020   Procedure: CORONARY ATHERECTOMY;  Surgeon: Wellington Hampshire, MD;  Location: [facility];  Service: Cardiovascular;  Laterality: N/A;   INTRAVASCULAR ULTRASOUND/IVUS N/A 06/24/2020   Procedure: Intravascular Ultrasound/IVUS;  Surgeon: Wellington Hampshire, MD;  Location: [facility];  Service: Cardiovascular;  Laterality: N/A;   LEFT HEART CATH AND CORONARY ANGIOGRAPHY N/A 06/22/2020   Procedure: LEFT HEART CATH AND CORONARY ANGIOGRAPHY;  Surgeon: Nelva Bush, MD;  Location: [facility];  Service: Cardiovascular;  Laterality: N/A;   ORIF ELBOW FRACTURE Right 04/10/2020   Procedure: OPEN REDUCTION INTERNAL FIXATION (ORIF) ELBOW/OLECRANON FRACTURE;  Surgeon: Marchia Bond, MD;  Location: [facility];  Service: Orthopedics;  Laterality: Right;   Family History   Problem Relation Age of Onset   Alzheimer's disease Father    Diabetes Mother    Social History   Socioeconomic History   Marital status: Married    Spouse name: Not on file   Number of children: Not on file   Years of education: Not on file   Highest education level: Not on file  Occupational History   Occupation: Retired    Fish farm manager: OTHER    Comment: Scientist, research (life sciences)  Tobacco Use   Smoking status: Former    Types: Cigarettes    Quit date: 1960    Years since quitting: 62.7   Smokeless tobacco: Former    Types: Chew    Quit date: 2011  Vaping Use   Vaping Use: Never used  Substance and Sexual Activity   Alcohol use: No   Drug use: No   Sexual activity: Not Currently  Other Topics Concern   Not on file  Social History Narrative   Not on file   Social Determinants of Health   Financial Resource Strain: Not on file  Food Insecurity: Not on file  Transportation Needs: Not on file  Physical Activity: Not on file  Stress: Not  on file  Social Connections: Not on file    Tobacco Counseling Counseling given: Not Answered   Clinical Intake:                 Diabetic?No         Activities of Daily Living In your present state of health, do you have any difficulty performing the following activities: 12/15/2020 04/10/2020  Hearing? Tempie Donning  Vision? N N  Difficulty concentrating or making decisions? N N  Walking or climbing stairs? Y Y  Dressing or bathing? N Y  Doing errands, shopping? N -  Some recent data might be hidden    Patient Care Team: [name], MD as PCP - General (Family Medicine) Park Liter, MD as PCP - Cardiology (Cardiology) Burnice Logan, [facility] (Inactive) as Pharmacist (Pharmacist)  Indicate any recent Medical Services you may have received from other than [facility] providers in the past year (date may be approximate).     Assessment:   This is a routine wellness examination for Mads.  Hearing/Vision  screen No results found.  Dietary issues and exercise activities discussed:     Goals Addressed   None   Depression Screen PHQ 2/9 Scores 12/15/2020 09/04/2019 08/15/2019 08/15/2019  PHQ - 2 Score 0 0 0 0    Fall Risk Fall Risk  12/15/2020 12/05/2019 08/15/2019 08/15/2019 01/12/2016  Falls in the past year? 0 0 1 1 No  Comment - - - - Emmi Telephone Survey: data to providers prior to load  Number falls in past yr: 0 0 0 0 -  Injury with Fall? 0 0 1 1 -  Risk for fall due to : Impaired balance/gait - Impaired vision Other (Comment) -  Follow up Falls prevention discussed Falls evaluation completed Falls evaluation completed - -    FALL RISK PREVENTION PERTAINING TO THE HOME:  Any stairs in or around the home? No  If so, are there any without handrails?  N/A Home free of loose throw rugs in walkways, pet beds, electrical cords, etc? Yes  Adequate lighting in your home to reduce risk of falls? Yes   ASSISTIVE DEVICES UTILIZED TO PREVENT FALLS:  Life alert? No  Use of a cane, walker or w/c? No  Grab bars in the bathroom? Yes  Shower chair or bench in shower? Yes  Elevated toilet seat or a handicapped toilet? Yes   TIMED UP AND GO:  Was the test performed?  N/A .  Length of time to ambulate 10 feet: N/A sec.     Cognitive Function: MMSE - Mini Mental State Exam 12/12/2019  Orientation to time 4  Orientation to Place 3  Registration 3  Attention/ Calculation 2  Recall 1  Language- name 2 objects 2  Language- repeat 1  Language- follow 3 step command 3  Language- read & follow direction 1  Write a sentence 1  Copy design 0  Total score 21        Immunizations Immunization History  Administered Date(s) Administered   Influenza Split 02/06/2013, 02/18/2015   Influenza Whole 02/07/2011, 03/15/2012   Influenza, High Dose Seasonal PF 02/01/2017, 01/17/2018   Influenza,inj,Quad PF,6+ Mos 01/07/2014   Influenza-Unspecified 01/21/2020   PFIZER(Purple Top)SARS-COV-2 Vaccination  06/10/2019, 07/01/2019, 02/28/2020   Pneumococcal Conjugate-13 12/31/2014   Pneumococcal Polysaccharide-23 02/07/2011   Tdap 08/01/2011    TDAP status: Up to date  Flu Vaccine status: Due, Education has been provided regarding the importance of this vaccine. Advised may receive this  vaccine at local pharmacy or Health Dept. Aware to provide a copy of the vaccination record if obtained from local pharmacy or Health Dept. Verbalized acceptance and understanding.  Pneumococcal vaccine status: Up to date  Covid-19 vaccine status: Completed vaccines  Qualifies for Shingles Vaccine? Yes   Zostavax completed No   Shingrix Completed?: No.    Education has been provided regarding the importance of this vaccine. Patient has been advised to call insurance company to determine out of pocket expense if they have not yet received this vaccine. Advised may also receive vaccine at local pharmacy or Health Dept. Verbalized acceptance and understanding.  Screening Tests Health Maintenance  Topic Date Due   Zoster Vaccines- Shingrix (1 of 2) Never done   COVID-19 Vaccine (4 - Booster for Pfizer series) 05/22/2020   INFLUENZA VACCINE  12/07/2020   TETANUS/TDAP  07/31/2021   HPV VACCINES  Aged Out    Health Maintenance  Health Maintenance Due  Topic Date Due   Zoster Vaccines- Shingrix (1 of 2) Never done   COVID-19 Vaccine (4 - Booster for Pfizer series) 05/22/2020   INFLUENZA VACCINE  12/07/2020    Colorectal cancer screening: No longer required.   Lung Cancer Screening: (Low Dose CT Chest recommended if Age 84-80 years, 30 pack-year currently smoking OR have quit w/in 15years.) does not qualify.   Lung Cancer Screening Referral: N/A  Additional Screening:  Hepatitis C Screening: does qualify; Completed N/A  Vision Screening: Recommended annual ophthalmology exams for early detection of glaucoma and other disorders of the eye. Is the patient up to date with their annual eye exam?  No   Who is the provider or what is the name of the office in which the patient attends annual eye exams? [facility] in [place] If pt is not established with a provider, would they like to be referred to a provider to establish care? No .   Dental Screening: Recommended annual dental exams for proper oral hygiene  Community Resource Referral / Chronic Care Management: CRR required this visit?  No   CCM required this visit?  No      Plan:     I have personally reviewed and noted the following in the patient's chart:   Medical and social history Use of alcohol, tobacco or illicit drugs  Current medications and supplements including opioid prescriptions. Patient is not currently taking opioid prescriptions. Functional ability and status Nutritional status Physical activity Advanced directives List of other physicians Hospitalizations, surgeries, and ER visits in previous 12 months Vitals Screenings to include cognitive, depression, and falls Referrals and appointments  In addition, I have reviewed and discussed with patient certain preventive protocols, quality metrics, and best practice recommendations. A written personalized care plan for preventive services as well as general preventive health recommendations were provided to patient.     Burna Forts, [place]   01/29/2021   Nurse Notes: Non Face to face 30 minute visit encounter.   Mr. Dufresne , Thank you for taking time to come for your Medicare Wellness Visit. I appreciate your ongoing commitment to your health goals. Please review the following plan we discussed and let me know if I can assist you in the future.   These are the goals we discussed:  Goals      Pharmacy Care Plan     CARE PLAN ENTRY  Current Barriers:  Chronic Disease Management support, education, and care coordination needs related to Hypertension and Hypothyroidism   Hypertension Pharmacist  Clinical Goal(s): Over the next 90 days,  patient will work with PharmD and providers to maintain BP goal <140/90 Current regimen:  Metoprolol 25 mg daily Interventions: Patient resumed metorprolol 25 mg dosing once daily in the evening.  Recommend patient utilize chart from daughter to stay on track with consistent dosing.  Patient self care activities - Over the next 90 days, patient will: Check BP weekly, document, and provide at future appointments Ensure daily salt intake < 2300 mg/day  Hypothyroidism Pharmacist Clinical Goal(s): Over the next 90 days, patient will work with PharmD and providers to achieve in range TSH  Current regimen:  Synthroid 150 mcg daily Interventions: Recommend patient take thyroid medication consistently each day at same time.  Pharmacist coordinating lab appointment with Dr. Henrene Pastor to avoid excess Synthroid if dosage changes at next check.  Patient self care activities - Over the next 90 days, patient will: Continue taking Synthroid daily each am 30 minutes prior to food.  Medication management Pharmacist Clinical Goal(s): Over the next 90 days, patient will work with PharmD and providers to maintain optimal medication adherence Current pharmacy: [facility] Interventions Comprehensive medication review performed. Continue current medication management strategy Patient self care activities - Over the next 90 days, patient will: Focus on medication adherence by using pill box and chart Take medications as prescribed Report any questions or concerns to PharmD and/or provider(s)  Please see past updates related to this goal by clicking on the "Past Updates" button in the selected goal          This is a list of the screening recommended for you and due dates:  Health Maintenance  Topic Date Due   Zoster (Shingles) Vaccine (1 of 2) Never done   COVID-19 Vaccine (4 - Booster for Pfizer series) 05/22/2020   Flu Shot  12/07/2020   Tetanus Vaccine  07/31/2021   HPV Vaccine  Aged Out

## 2021-02-16 ENCOUNTER — Other Ambulatory Visit: Payer: Self-pay | Admitting: Internal Medicine

## 2021-02-22 ENCOUNTER — Telehealth: Payer: Self-pay | Admitting: Cardiology

## 2021-02-22 NOTE — Telephone Encounter (Signed)
Daughter of the patient wanted to discuss how to take this medication following a dental extraction   The patient had a tooth extraction last week and had some bleeding that woke him up approximately 2:30 am Sunday morning. The bleeding lasted ~ 12 hours.   The daughter just wanted to know what to do to make sure the patient is taking his meds properly

## 2021-02-22 NOTE — Telephone Encounter (Signed)
Pt c/o medication issue:  1. Name of Medication: clopidogrel (PLAVIX) 75 MG tablet  2. How are you currently taking this medication (dosage and times per day)?   3. Are you having a reaction (difficulty breathing--STAT)?   4. What is your medication issue? Daughter of the patient wanted to discuss how to take this medication following a dental extraction  The patient had a tooth extraction last week and had some bleeding that woke him up approximately 2:30 am Sunday morning. The bleeding lasted ~ 12 hours.  The daughter just wanted to know what to do to make sure the patient is taking his meds properly

## 2021-02-23 NOTE — Telephone Encounter (Signed)
Daughter states that the patient has had no bleeding since Sunday. She also states she feels the bleeding started after the pt ate tacos on Saturday. How do you advise on the Plavix and aspirin?

## 2021-02-26 NOTE — Telephone Encounter (Signed)
Spoke to patient daughter per dpr. Patient has not quit aspirin. Per Dr. Agustin Cree patient will add back plavix and see how it does if they have any bleeding patient daughter advised to call our office. No further questions.

## 2021-03-01 ENCOUNTER — Telehealth: Payer: Self-pay | Admitting: Cardiology

## 2021-03-01 NOTE — Telephone Encounter (Signed)
STAT if patient feels like he/she is going to faint   Are you dizzy now? No   Do you feel faint or have you passed out? No   Do you have any other symptoms? No   Have you checked your HR and BP (record if available)?   His BP has been good HR regulated since dental procedure    Wants to know if a sooner appt needs to be scheduled for blood work and medication adjustments due to this.

## 2021-03-01 NOTE — Telephone Encounter (Signed)
Spoke with [name] per DPR. Advised that pt had recent labs at PCP. Advised to discuss all medications with pharmacist as when would be best time to take as [name] states the dizziness starts after the pt takes 6 of his medications in the morning. [name] verbalized understanding and had no additional questions.

## 2021-03-02 ENCOUNTER — Telehealth: Payer: Self-pay

## 2021-03-02 ENCOUNTER — Telehealth: Payer: Self-pay | Admitting: Cardiology

## 2021-03-02 NOTE — Telephone Encounter (Signed)
Patient daughter spoke to pharmacist. They advised the immediate release metoprolol may be causing the issues versus the long acting version of metoprolol. She wants me to check with Dr. Agustin Cree about that.

## 2021-03-02 NOTE — Telephone Encounter (Signed)
Patient changed from Metoprolol ER to IR, now is dizzy. Had questions as to why. Told them to call Cardio and asked to get it changed.   Spoke with them about Onboarding to [facility], they are very interested. Will go over more in person during f/u

## 2021-03-02 NOTE — Telephone Encounter (Signed)
Follow Up:     Please call, daughter wants to talk about patient's Metoprolol.

## 2021-03-02 NOTE — Telephone Encounter (Signed)
Left message for patient daughter to return call.  

## 2021-03-03 MED ORDER — METOPROLOL SUCCINATE ER 50 MG PO TB24: 50.0000 mg | ORAL_TABLET | Freq: Every day | ORAL | 2 refills | Status: DC

## 2021-03-03 NOTE — Telephone Encounter (Signed)
Patient daughter called. Informed her we are stopping metoprolol tartrate and starting metoprolol succinate 50 mg daily.

## 2021-03-03 NOTE — Telephone Encounter (Signed)
Patient is feeling dizzy and they are concerned that immediate release metoprolol is causing that. Should we change to long acting?

## 2021-04-07 ENCOUNTER — Other Ambulatory Visit: Payer: Self-pay | Admitting: Legal Medicine

## 2021-04-07 DIAGNOSIS — E039 Hypothyroidism, unspecified: Secondary | ICD-10-CM

## 2021-04-20 ENCOUNTER — Other Ambulatory Visit: Payer: Medicare HMO

## 2021-04-28 ENCOUNTER — Ambulatory Visit (INDEPENDENT_AMBULATORY_CARE_PROVIDER_SITE_OTHER): Payer: Medicare HMO | Admitting: Legal Medicine

## 2021-04-28 ENCOUNTER — Encounter: Payer: Self-pay | Admitting: Legal Medicine

## 2021-04-28 ENCOUNTER — Other Ambulatory Visit: Payer: Self-pay

## 2021-04-28 VITALS — BP 100/60 | HR 76 | Temp 97.4°F | Resp 16 | Ht 73.0 in | Wt 131.0 lb

## 2021-04-28 DIAGNOSIS — I25110 Atherosclerotic heart disease of native coronary artery with unstable angina pectoris: Secondary | ICD-10-CM

## 2021-04-28 DIAGNOSIS — M810 Age-related osteoporosis without current pathological fracture: Secondary | ICD-10-CM | POA: Diagnosis not present

## 2021-04-28 DIAGNOSIS — E782 Mixed hyperlipidemia: Secondary | ICD-10-CM

## 2021-04-28 DIAGNOSIS — E440 Moderate protein-calorie malnutrition: Secondary | ICD-10-CM | POA: Diagnosis not present

## 2021-04-28 DIAGNOSIS — E039 Hypothyroidism, unspecified: Secondary | ICD-10-CM

## 2021-04-28 DIAGNOSIS — I10 Essential (primary) hypertension: Secondary | ICD-10-CM | POA: Diagnosis not present

## 2021-04-28 NOTE — Progress Notes (Signed)
Subjective:  Patient ID: James Iha Sr., male    DOB: 05/04/1932  Age: 87 y.o. MRN: [id number]  Chief Complaint  Patient presents with   Hypertension   Hyperlipidemia   Asthma    HPI: chronic visit  Patient presents for follow up of hypertension.  Patient tolerating metoprolol well with side effects.  Patient was diagnosed with hypertension 2012 so has been treated for hypertension for 12 years.Patient is working on maintaining diet and exercise regimen and follows up as directed. Complication include none.   Patient presents with hyperlipidemia.  Compliance with treatment has been good; patient takes medicines as directed, maintains low cholesterol diet, follows up as directed, and maintains exercise regimen.  Patient is using atorvastatin without problems.   No wheezing or SOB He is on levothyroxine 156mcg   Current Outpatient Medications on File Prior to Visit  Medication Sig Dispense Refill   acetaminophen (TYLENOL) 325 MG tablet Take 2 tablets (650 mg total) by mouth every 6 (six) hours as needed. (Patient taking differently: Take 650 mg by mouth every 6 (six) hours as needed for mild pain or moderate pain.) 30 tablet 1   albuterol (VENTOLIN HFA) 108 (90 Base) MCG/ACT inhaler INHALE 2 PUFFS INTO THE LUNGS EVERY 6 HOURS AS NEEDED FOR WHEEZING OR SHORTNESS OF BREATH 18 each 1   alendronate (FOSAMAX) 70 MG tablet 1 TABLET BY MOUTH WEEKLY IN THE MORNING, AT LEAST 30 MIN BEFORE FIRST FOOD/BEVERAGE/MEDS OF DAY (Patient taking differently: Take 70 mg by mouth once a week.) 12 tablet 4   aspirin EC 81 MG EC tablet Take 1 tablet (81 mg total) by mouth daily. Swallow whole. 90 tablet 3   atorvastatin (LIPITOR) 80 MG tablet Take 1 tablet (80 mg total) by mouth daily. 90 tablet 3   clopidogrel (PLAVIX) 75 MG tablet Take 1 tablet (75 mg total) by mouth daily. 90 tablet 3   Cyanocobalamin (B-12) 1000 MCG TABS TAKE 1 TABLET (1,000 MCG TOTAL) BY MOUTH DAILY. 30 tablet 0   ferrous sulfate  (FERROUSUL) 325 (65 FE) MG tablet Take 1 tablet (325 mg total) by mouth daily with breakfast. 30 tablet 12   ferrous sulfate 325 (65 FE) MG tablet TAKE 1 TABLET (325 MG TOTAL) BY MOUTH DAILY WITH BREAKFAST. (Patient taking differently: Take 325 mg by mouth daily with breakfast.) 30 tablet 12   ipratropium-albuterol (DUONEB) 0.5-2.5 (3) MG/3ML SOLN Take 3 mLs by nebulization every 4 (four) hours as needed (shortness of breath or wheezing).     metoprolol succinate (TOPROL-XL) 50 MG 24 hr tablet Take 1 tablet (50 mg total) by mouth daily. Take with or immediately following a meal. 30 tablet 2   Multiple Vitamins-Minerals (MULTIVITAMIN ADULT PO) Take 1 tablet by mouth daily. Unknown strength     Nutritional Supplements (BOOST NUTRITIONAL ENERGY PO) Take 237 mLs by mouth in the morning and at bedtime. Drinking 1-2 per day     SYNTHROID 175 MCG tablet TAKE 1 TABLET BY MOUTH DAILY BEFORE BREAKFAST. 75 tablet 2   triamcinolone (KENALOG) 0.1 % Apply 1 application topically 2 (two) times daily as needed (rash).     No current facility-administered medications on file prior to visit.   Past Medical History:  Diagnosis Date   Acquired hypothyroidism 05/11/7515   Acute systolic heart failure ([place]) 06/25/2020   Anemia 02/05/2020   boarderline anemic   Atopic dermatitis 12/05/2019   Bacterial pneumonia, unspecified 06/14/2012   06/14/2012 CXR w/ RML PNA >Zithromax and Rocephin  Bradycardia 11/28/2018   CAD (coronary artery disease) 07/07/2020   Cancer (HCC)    prostate cancer-seed implant   Closed fracture dislocation of right elbow    Closed fracture of right olecranon process 04/10/2020   COPD (chronic obstructive pulmonary disease) ([place]) 01/21/2020   Dermatitis 02/05/2020   Dizziness 02/19/2016   Essential hypertension 11/28/2018   History of prostate cancer    seed implant   Hyperlipemia    Hyperlipidemia, mixed 08/21/2008   Qualifier: Diagnosis of  By: Annamaria Boots MD, Clinton D    LVH (left ventricular  hypertrophy) 11/28/2018   Malnutrition of moderate degree ([place]) 08/15/2019   Mixed conductive and sensorineural hearing loss, 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   Moderate persistent asthma, uncomplicated 2/44/0102   Nonsustained ventricular tachycardia 01/30/2019   NSTEMI (non-ST elevated myocardial infarction) ([place]) 06/21/2020   Other fatigue 02/05/2020   PAF (paroxysmal atrial fibrillation) ([place]) 06/25/2020   Seasonal and perennial allergic rhinitis 05/17/2007       Senile osteoporosis 08/15/2019   SINUSITIS, ACUTE 06/28/2007   Qualifier: Diagnosis of  By: Royal Piedra NP, Tammy     Ventricular ectopy 11/28/2018   Past Surgical History:  Procedure Laterality Date   APPENDECTOMY     CORONARY ATHERECTOMY N/A 06/24/2020   Procedure: CORONARY ATHERECTOMY;  Surgeon: Wellington Hampshire, MD;  Location: [facility];  Service: Cardiovascular;  Laterality: N/A;   INTRAVASCULAR ULTRASOUND/IVUS N/A 06/24/2020   Procedure: Intravascular Ultrasound/IVUS;  Surgeon: Wellington Hampshire, MD;  Location: [facility];  Service: Cardiovascular;  Laterality: N/A;   LEFT HEART CATH AND CORONARY ANGIOGRAPHY N/A 06/22/2020   Procedure: LEFT HEART CATH AND CORONARY ANGIOGRAPHY;  Surgeon: Nelva Bush, MD;  Location: [facility];  Service: Cardiovascular;  Laterality: N/A;   ORIF ELBOW FRACTURE Right 04/10/2020   Procedure: OPEN REDUCTION INTERNAL FIXATION (ORIF) ELBOW/OLECRANON FRACTURE;  Surgeon: Marchia Bond, MD;  Location: [facility];  Service: Orthopedics;  Laterality: Right;    Family History  Problem Relation Age of Onset   Alzheimer's disease Father    Diabetes Mother    Social History   Socioeconomic History   Marital status: Married    Spouse name: Not on file   Number of children: Not on file   Years of education: Not on file   Highest education level: Not on file  Occupational History   Occupation: Retired    Fish farm manager: OTHER    Comment: Scientist, research (life sciences)  Tobacco Use   Smoking  status: Former    Types: Cigarettes    Quit date: 1960    Years since quitting: 63.0   Smokeless tobacco: Former    Types: Chew    Quit date: 2011  Vaping Use   Vaping Use: Never used  Substance and Sexual Activity   Alcohol use: No   Drug use: No   Sexual activity: Not Currently  Other Topics Concern   Not on file  Social History Narrative   Not on file   Social Determinants of Health   Financial Resource Strain: Low Risk    Difficulty of Paying Living Expenses: Not hard at all  Food Insecurity: No Food Insecurity   Worried About Charity fundraiser in the Last Year: Never true   Arboriculturist in the Last Year: Never true  Transportation Needs: No Transportation Needs   Lack of Transportation (Medical): No   Lack of Transportation (Non-Medical): No  Physical Activity: Insufficiently Active   Days of Exercise per Week: 7 days  Minutes of Exercise per Session: 10 min  Stress: No Stress Concern Present   Feeling of Stress : Not at all  Social Connections: Socially Integrated   Frequency of Communication with Friends and Family: Three times a week   Frequency of Social Gatherings with Friends and Family: Three times a week   Attends Religious Services: 1 to 4 times per year   Active Member of Clubs or Organizations: Yes   Attends Archivist Meetings: 1 to 4 times per year   Marital Status: Married    Review of Systems  Constitutional:  Negative for chills, fatigue, fever and unexpected weight change.  HENT:  Negative for congestion, ear pain, sinus pain and sore throat.   Respiratory:  Negative for cough and shortness of breath.   Cardiovascular:  Negative for chest pain and palpitations.  Gastrointestinal:  Negative for abdominal pain, blood in stool, constipation, diarrhea, nausea and vomiting.  Endocrine: Negative for polydipsia.  Genitourinary:  Negative for dysuria.  Musculoskeletal:  Negative for back pain.  Skin:  Negative for rash.  Neurological:   Negative for headaches.    Objective:  BP 100/60    Pulse 76    Temp (!) 97.4 F (36.3 C)    Resp 16    Ht 6\' 1"  (1.854 m)    Wt 131 lb (59.4 kg)    SpO2 98%    BMI 17.28 kg/m   BP/Weight 04/28/2021 08/25/6220 01/14/9891  Systolic BP 119 417 408  Diastolic BP 60 56 62  Wt. (Lbs) 131 139 139.2  BMI 17.28 18.34 18.88    Physical Exam Vitals reviewed.  Constitutional:      General: He is not in acute distress.    Appearance: Normal appearance.  HENT:     Right Ear: Tympanic membrane, ear canal and external ear normal.     Left Ear: Tympanic membrane, ear canal and external ear normal.     Mouth/Throat:     Mouth: Mucous membranes are moist.     Pharynx: Oropharynx is clear.  Eyes:     Extraocular Movements: Extraocular movements intact.     Conjunctiva/sclera: Conjunctivae normal.     Pupils: Pupils are equal, round, and reactive to light.  Cardiovascular:     Rate and Rhythm: Normal rate and regular rhythm.     Pulses: Normal pulses.     Heart sounds: Normal heart sounds. No murmur heard.   No gallop.  Pulmonary:     Effort: Pulmonary effort is normal. No respiratory distress.     Breath sounds: Normal breath sounds. No wheezing.  Abdominal:     General: Abdomen is flat. Bowel sounds are normal. There is no distension.     Palpations: Abdomen is soft.     Tenderness: There is no abdominal tenderness.  Musculoskeletal:        General: Normal range of motion.     Cervical back: Normal range of motion and neck supple.     Right lower leg: No edema.     Left lower leg: No edema.  Skin:    General: Skin is warm.     Capillary Refill: Capillary refill takes less than 2 seconds.  Neurological:     General: No focal deficit present.     Mental Status: He is alert.     Motor: Weakness present.        Lab Results  Component Value Date   WBC 9.3 12/15/2020   HGB 11.8 (L) 12/15/2020  HCT 36.2 (L) 12/15/2020   PLT 209 12/15/2020   GLUCOSE 98 12/15/2020   CHOL 127  12/15/2020   TRIG 80 12/15/2020   HDL 47 12/15/2020   LDLCALC 64 12/15/2020   ALT 22 12/15/2020   AST 28 12/15/2020   NA 137 12/15/2020   K 4.4 12/15/2020   CL 102 12/15/2020   CREATININE 0.92 12/15/2020   BUN 16 12/15/2020   CO2 22 12/15/2020   TSH 3.870 01/25/2021   HGBA1C 6.3 (H) 06/22/2020      Assessment & Plan:   Problem List Items Addressed This Visit       Cardiovascular and Mediastinum   Essential hypertension   Relevant Orders   CBC with Differential/Platelet   Comprehensive metabolic panel An individual hypertension care plan was established and reinforced today.  The patient's status was assessed using clinical findings on exam and labs or diagnostic tests. The patient's success at meeting treatment goals on disease specific evidence-based guidelines and found to be well controlled. SELF MANAGEMENT: The patient and I together assessed ways to personally work towards obtaining the recommended goals. RECOMMENDATIONS: avoid decongestants found in common cold remedies, decrease consumption of alcohol, perform routine monitoring of BP with home BP cuff, exercise, reduction of dietary salt, take medicines as prescribed, try not to miss doses and quit smoking.  Regular exercise and maintaining a healthy weight is needed.  Stress reduction may help. A CLINICAL SUMMARY including written plan identify barriers to care unique to individual due to social or financial issues.  We attempt to mutually creat solutions for individual and family understanding.    CAD (coronary artery disease) An individual plan was formulated based on patient history and exam, labs and evidence based data. Patient has not had recent angina or nitroglycerin use. continue present treatment.      Endocrine   Acquired hypothyroidism   Relevant Orders   TSH Patient is known to have hypothyroidism and is n treatment with levothyroid 164mcg.  Patient was diagnosed 20 years ago.  Other treatment includes  none.  Patient is compliant with medicines and last TSH 6 months ago.  Last TSH was normal.      Musculoskeletal and Integument   Senile osteoporosis Patient on osteoporosis treatment     Other   Hyperlipidemia, mixed - Primary   Relevant Orders   Lipid panel AN INDIVIDUAL CARE PLAN for hyperlipidemia/ cholesterol was established and reinforced today.  The patient's status was assessed using clinical findings on exam, lab and other diagnostic tests. The patient's disease status was assessed based on evidence-based guidelines and found to be well controlled. MEDICATIONS were reviewed. SELF MANAGEMENT GOALS have been discussed and patient's success at attaining the goal of low cholesterol was assessed. RECOMMENDATION given include regular exercise 3 days a week and low cholesterol/low fat diet. CLINICAL SUMMARY including written plan to identify barriers unique to the patient due to social or economic  reasons was discussed.     Malnutrition of moderate degree (HCC) Supplement nutrition with protein/calorie supplement with meals to improve nutritional status.   .    Orders Placed This Encounter  Procedures   CBC with Differential/Platelet   Comprehensive metabolic panel   Lipid panel   TSH   30 minute visit review of old records  Follow-up: Return in about 6 months (around 10/27/2021) for fasting.  An After Visit Summary was printed and given to the patient.  Reinaldo Meeker, MD [facility] [phone]

## 2021-04-29 ENCOUNTER — Other Ambulatory Visit: Payer: Self-pay

## 2021-04-29 LAB — LIPID PANEL
Chol/HDL Ratio: 2.5 ratio (ref 0.0–5.0)
Cholesterol, Total: 117 mg/dL (ref 100–199)
HDL: 47 mg/dL (ref >39)
LDL Chol Calc (NIH): 56 mg/dL (ref 0–99)
Triglycerides: 66 mg/dL (ref 0–149)
VLDL Cholesterol Cal: 14 mg/dL (ref 5–40)

## 2021-04-29 LAB — COMPREHENSIVE METABOLIC PANEL
ALT: 17 IU/L (ref 0–44)
AST: 22 IU/L (ref 0–40)
Albumin/Globulin Ratio: 1.9 (ref 1.2–2.2)
Albumin: 4.1 g/dL (ref 3.6–4.6)
Alkaline Phosphatase: 64 IU/L (ref 44–121)
BUN/Creatinine Ratio: 9 — ABNORMAL LOW (ref 10–24)
BUN: 8 mg/dL (ref 8–27)
Bilirubin Total: 0.3 mg/dL (ref 0.0–1.2)
CO2: 22 mmol/L (ref 20–29)
Calcium: 9.2 mg/dL (ref 8.6–10.2)
Chloride: 103 mmol/L (ref 96–106)
Creatinine, Ser: 0.93 mg/dL (ref 0.76–1.27)
Globulin, Total: 2.2 g/dL (ref 1.5–4.5)
Glucose: 97 mg/dL (ref 70–99)
Potassium: 4.1 mmol/L (ref 3.5–5.2)
Sodium: 139 mmol/L (ref 134–144)
Total Protein: 6.3 g/dL (ref 6.0–8.5)
eGFR: 79 mL/min/{1.73_m2} (ref >59)

## 2021-04-29 LAB — CBC WITH DIFFERENTIAL/PLATELET
Basophils Absolute: 0 10*3/uL (ref 0.0–0.2)
Basos: 1 %
EOS (ABSOLUTE): 0.3 10*3/uL (ref 0.0–0.4)
Eos: 5 %
Hematocrit: 35.9 % — ABNORMAL LOW (ref 37.5–51.0)
Hemoglobin: 11.2 g/dL — ABNORMAL LOW (ref 13.0–17.7)
Immature Grans (Abs): 0 10*3/uL (ref 0.0–0.1)
Immature Granulocytes: 1 %
Lymphocytes Absolute: 1.2 10*3/uL (ref 0.7–3.1)
Lymphs: 20 %
MCH: 28.1 pg (ref 26.6–33.0)
MCHC: 31.2 g/dL — ABNORMAL LOW (ref 31.5–35.7)
MCV: 90 fL (ref 79–97)
Monocytes Absolute: 0.7 10*3/uL (ref 0.1–0.9)
Monocytes: 12 %
Neutrophils Absolute: 3.9 10*3/uL (ref 1.4–7.0)
Neutrophils: 61 %
Platelets: 238 10*3/uL (ref 150–450)
RBC: 3.99 x10E6/uL — ABNORMAL LOW (ref 4.14–5.80)
RDW: 12.7 % (ref 11.6–15.4)
WBC: 6.2 10*3/uL (ref 3.4–10.8)

## 2021-04-29 LAB — CARDIOVASCULAR RISK ASSESSMENT

## 2021-04-29 LAB — TSH: TSH: 1.71 u[IU]/mL (ref 0.450–4.500)

## 2021-04-29 NOTE — Progress Notes (Signed)
Anemia of chronic disease stable, kidney and liver tests normal, cholesterol positive, TSH 1.71 normal,  lp

## 2021-05-13 ENCOUNTER — Telehealth: Payer: Self-pay

## 2021-05-13 NOTE — Chronic Care Management (AMB) (Signed)
Chronic Care Management Pharmacy Assistant   Name: James Lassen Sr.  MRN: [id number] DOB: 03-27-1932  Reason for Encounter: Disease State/ Hypertension  Recent office visits:  01-29-2021 Burna Forts, CMA. Medicare annual visit.  04-28-2021 [name], MD. RBC=3.99, Hemo= 11.2, Hema= 35.9, MCHC= 31.2. BUN/Creatinine= 9. Follow up in 6 months.  Recent consult visits:  None  Hospital visits:  None in previous 6 months  Medications: Outpatient Encounter Medications as of 05/13/2021  Medication Sig   acetaminophen (TYLENOL) 325 MG tablet Take 2 tablets (650 mg total) by mouth every 6 (six) hours as needed. (Patient taking differently: Take 650 mg by mouth every 6 (six) hours as needed for mild pain or moderate pain.)   albuterol (VENTOLIN HFA) 108 (90 Base) MCG/ACT inhaler INHALE 2 PUFFS INTO THE LUNGS EVERY 6 HOURS AS NEEDED FOR WHEEZING OR SHORTNESS OF BREATH   alendronate (FOSAMAX) 70 MG tablet 1 TABLET BY MOUTH WEEKLY IN THE MORNING, AT LEAST 30 MIN BEFORE FIRST FOOD/BEVERAGE/MEDS OF DAY (Patient taking differently: Take 70 mg by mouth once a week.)   aspirin EC 81 MG EC tablet Take 1 tablet (81 mg total) by mouth daily. Swallow whole.   atorvastatin (LIPITOR) 80 MG tablet Take 1 tablet (80 mg total) by mouth daily.   clopidogrel (PLAVIX) 75 MG tablet Take 1 tablet (75 mg total) by mouth daily.   Cyanocobalamin (B-12) 1000 MCG TABS TAKE 1 TABLET (1,000 MCG TOTAL) BY MOUTH DAILY.   ferrous sulfate (FERROUSUL) 325 (65 FE) MG tablet Take 1 tablet (325 mg total) by mouth daily with breakfast.   ferrous sulfate 325 (65 FE) MG tablet TAKE 1 TABLET (325 MG TOTAL) BY MOUTH DAILY WITH BREAKFAST. (Patient taking differently: Take 325 mg by mouth daily with breakfast.)   ipratropium-albuterol (DUONEB) 0.5-2.5 (3) MG/3ML SOLN Take 3 mLs by nebulization every 4 (four) hours as needed (shortness of breath or wheezing).   metoprolol succinate (TOPROL-XL) 50 MG 24 hr tablet Take 1  tablet (50 mg total) by mouth daily. Take with or immediately following a meal.   Multiple Vitamins-Minerals (MULTIVITAMIN ADULT PO) Take 1 tablet by mouth daily. Unknown strength   Nutritional Supplements (BOOST NUTRITIONAL ENERGY PO) Take 237 mLs by mouth in the morning and at bedtime. Drinking 1-2 per day   SYNTHROID 175 MCG tablet TAKE 1 TABLET BY MOUTH DAILY BEFORE BREAKFAST.   triamcinolone (KENALOG) 0.1 % Apply 1 application topically 2 (two) times daily as needed (rash).   No facility-administered encounter medications on file as of 05/13/2021.   Recent Office Vitals: BP Readings from Last 3 Encounters:  04/28/21 100/60  01/29/21 (!) 109/56  01/15/21 108/62   Pulse Readings from Last 3 Encounters:  04/28/21 76  01/29/21 61  01/15/21 67    Wt Readings from Last 3 Encounters:  04/28/21 131 lb (59.4 kg)  01/29/21 139 lb (63 kg)  01/15/21 139 lb 3.2 oz (63.1 kg)     Kidney Function Lab Results  Component Value Date/Time   CREATININE 0.93 04/28/2021 11:47 AM   CREATININE 0.92 12/15/2020 10:30 AM   GFRNONAA 78 06/29/2020 12:00 AM   GFRNONAA >60 06/25/2020 04:45 AM   GFRAA 90 06/29/2020 12:00 AM    BMP Latest Ref Rng & Units 04/28/2021 12/15/2020 06/29/2020  Glucose 70 - 99 mg/dL 97 98 82  BUN 8 - 27 mg/dL 8 16 12   Creatinine 0.76 - 1.27 mg/dL 0.93 0.92 0.88  BUN/Creat Ratio 10 - 24 9(L) 17 14  Sodium 134 - 144 mmol/L 139 137 134  Potassium 3.5 - 5.2 mmol/L 4.1 4.4 4.9  Chloride 96 - 106 mmol/L 103 102 99  CO2 20 - 29 mmol/L 22 22 21   Calcium 8.6 - 10.2 mg/dL 9.2 9.4 8.9     05-13-2021: 1st attempt left VM. 05-21-2021: 2nd attempt left VM 05-28-2021: 3rd attempt left VM  Care Gaps: Last annual wellness visit? 01-29-2021 Shingrix overdue Covid booster overdue Flu vaccine overdue  Star Rating Drugs: Atorvastatin 80 mg- Last filled 03-20-2021 90 DS  [place] Clinical Pharmacist Assistant [phone]

## 2021-05-29 ENCOUNTER — Other Ambulatory Visit: Payer: Self-pay | Admitting: Cardiology

## 2021-06-14 ENCOUNTER — Other Ambulatory Visit: Payer: Self-pay

## 2021-06-14 MED ORDER — ATORVASTATIN CALCIUM 80 MG PO TABS
80.0000 mg | ORAL_TABLET | Freq: Every day | ORAL | 1 refills | Status: DC
Start: 2021-06-14 — End: 2021-09-22

## 2021-06-15 ENCOUNTER — Other Ambulatory Visit: Payer: Self-pay

## 2021-06-16 ENCOUNTER — Other Ambulatory Visit: Payer: Self-pay

## 2021-06-16 MED ORDER — ASPIRIN 81 MG PO TBEC: 81.0000 mg | DELAYED_RELEASE_TABLET | Freq: Every day | ORAL | 3 refills | Status: DC

## 2021-06-16 NOTE — Telephone Encounter (Signed)
This is a Technical sales engineer pt, Dr. Agustin Cree

## 2021-06-24 ENCOUNTER — Telehealth: Payer: Self-pay

## 2021-06-24 NOTE — Chronic Care Management (AMB) (Signed)
Chronic Care Management Pharmacy Assistant   Name: James Kennard Sr.  MRN: [id number] DOB: 1932-01-28   Reason for Encounter: Disease State/ Hypertension   Recent office visits:  04-28-2021 [name], MD. RBC= 3.99, Hemo= 11.2, Hema= 35.9, MCHC= 31.2. BUN/Creatinine= 9. Follow up in 6 months.  Recent consult visits:  None  Hospital visits:  None in previous 6 months  Medications: Outpatient Encounter Medications as of 06/24/2021  Medication Sig   acetaminophen (TYLENOL) 325 MG tablet Take 2 tablets (650 mg total) by mouth every 6 (six) hours as needed. (Patient taking differently: Take 650 mg by mouth every 6 (six) hours as needed for mild pain or moderate pain.)   albuterol (VENTOLIN HFA) 108 (90 Base) MCG/ACT inhaler INHALE 2 PUFFS INTO THE LUNGS EVERY 6 HOURS AS NEEDED FOR WHEEZING OR SHORTNESS OF BREATH   alendronate (FOSAMAX) 70 MG tablet 1 TABLET BY MOUTH WEEKLY IN THE MORNING, AT LEAST 30 MIN BEFORE FIRST FOOD/BEVERAGE/MEDS OF DAY (Patient taking differently: Take 70 mg by mouth once a week.)   aspirin 81 MG EC tablet Take 1 tablet (81 mg total) by mouth daily. Swallow whole.   atorvastatin (LIPITOR) 80 MG tablet Take 1 tablet (80 mg total) by mouth daily.   clopidogrel (PLAVIX) 75 MG tablet Take 1 tablet (75 mg total) by mouth daily.   Cyanocobalamin (B-12) 1000 MCG TABS TAKE 1 TABLET (1,000 MCG TOTAL) BY MOUTH DAILY.   ferrous sulfate (FERROUSUL) 325 (65 FE) MG tablet Take 1 tablet (325 mg total) by mouth daily with breakfast.   ferrous sulfate 325 (65 FE) MG tablet TAKE 1 TABLET (325 MG TOTAL) BY MOUTH DAILY WITH BREAKFAST. (Patient taking differently: Take 325 mg by mouth daily with breakfast.)   ipratropium-albuterol (DUONEB) 0.5-2.5 (3) MG/3ML SOLN Take 3 mLs by nebulization every 4 (four) hours as needed (shortness of breath or wheezing).   metoprolol succinate (TOPROL-XL) 50 MG 24 hr tablet TAKE 1 TABLET BY MOUTH DAILY. TAKE WITH OR IMMEDIATELY  FOLLOWING A MEAL.   Multiple Vitamins-Minerals (MULTIVITAMIN ADULT PO) Take 1 tablet by mouth daily. Unknown strength   Nutritional Supplements (BOOST NUTRITIONAL ENERGY PO) Take 237 mLs by mouth in the morning and at bedtime. Drinking 1-2 per day   SYNTHROID 175 MCG tablet TAKE 1 TABLET BY MOUTH DAILY BEFORE BREAKFAST.   triamcinolone (KENALOG) 0.1 % Apply 1 application topically 2 (two) times daily as needed (rash).   No facility-administered encounter medications on file as of 06/24/2021.    Recent Office Vitals: BP Readings from Last 3 Encounters:  04/28/21 100/60  01/29/21 (!) 109/56  01/15/21 108/62   Pulse Readings from Last 3 Encounters:  04/28/21 76  01/29/21 61  01/15/21 67    Wt Readings from Last 3 Encounters:  04/28/21 131 lb (59.4 kg)  01/29/21 139 lb (63 kg)  01/15/21 139 lb 3.2 oz (63.1 kg)     Kidney Function Lab Results  Component Value Date/Time   CREATININE 0.93 04/28/2021 11:47 AM   CREATININE 0.92 12/15/2020 10:30 AM   GFRNONAA 78 06/29/2020 12:00 AM   GFRNONAA >60 06/25/2020 04:45 AM   GFRAA 90 06/29/2020 12:00 AM    BMP Latest Ref Rng & Units 04/28/2021 12/15/2020 06/29/2020  Glucose 70 - 99 mg/dL 97 98 82  BUN 8 - 27 mg/dL 8 16 12   Creatinine 0.76 - 1.27 mg/dL 0.93 0.92 0.88  BUN/Creat Ratio 10 - 24 9(L) 17 14  Sodium 134 - 144 mmol/L 139 137 134  Potassium 3.5 - 5.2 mmol/L 4.1 4.4 4.9  Chloride 96 - 106 mmol/L 103 102 99  CO2 20 - 29 mmol/L 22 22 21   Calcium 8.6 - 10.2 mg/dL 9.2 9.4 8.9    06-24-2021: 1st attempt left VM 07-02-2021: 2nd attempt left VM 07-05-2021: 3rd attempt left VM  Care Gaps: Last annual wellness visit? 01-29-2021 Shingrix overdue Covid booster overdue Flu vaccine overdue  Star Rating Drugs: Atorvastatin 80 mg- Last filled 06-19-2021 90 [facility]  [place] Clinical Pharmacist Assistant [phone]

## 2021-06-28 ENCOUNTER — Ambulatory Visit: Payer: Medicare HMO | Admitting: Cardiology

## 2021-06-28 ENCOUNTER — Other Ambulatory Visit: Payer: Self-pay

## 2021-06-28 ENCOUNTER — Encounter: Payer: Self-pay | Admitting: Cardiology

## 2021-06-28 VITALS — BP 112/78 | HR 72 | Ht 72.0 in | Wt 132.0 lb

## 2021-06-28 DIAGNOSIS — I10 Essential (primary) hypertension: Secondary | ICD-10-CM | POA: Diagnosis not present

## 2021-06-28 DIAGNOSIS — I480 Paroxysmal atrial fibrillation: Secondary | ICD-10-CM

## 2021-06-28 DIAGNOSIS — I2510 Atherosclerotic heart disease of native coronary artery without angina pectoris: Secondary | ICD-10-CM | POA: Diagnosis not present

## 2021-06-28 DIAGNOSIS — I493 Ventricular premature depolarization: Secondary | ICD-10-CM

## 2021-06-28 DIAGNOSIS — J449 Chronic obstructive pulmonary disease, unspecified: Secondary | ICD-10-CM | POA: Diagnosis not present

## 2021-06-28 NOTE — Patient Instructions (Signed)
Medication Instructions:  Your physician has recommended you make the following change in your medication:  STOP PLAVIX  *If you need a refill on your cardiac medications before your next appointment, please call your pharmacy*   Lab Work: NONE If you have labs (blood work) drawn today and your tests are completely normal, you will receive your results only by: [place] (if you have MyChart) OR A paper copy in the mail If you have any lab test that is abnormal or we need to change your treatment, we will call you to review the results.   Testing/Procedures: NONE   Follow-Up: At [facility], you and your health needs are our priority.  As part of our continuing mission to provide you with exceptional heart care, we have created designated Provider Care Teams.  These Care Teams include your primary Cardiologist (physician) and Advanced Practice Providers (APPs -  Physician Assistants and Nurse Practitioners) who all work together to provide you with the care you need, when you need it.  We recommend signing up for the patient portal called "MyChart".  Sign up information is provided on this After Visit Summary.  MyChart is used to connect with patients for Virtual Visits (Telemedicine).  Patients are able to view lab/test results, encounter notes, upcoming appointments, etc.  Non-urgent messages can be sent to your provider as well.   To learn more about what you can do with MyChart, go to [email or web address].    Your next appointment:   6 month(s)  The format for your next appointment:   In Person  Provider:   [name], MD    Other Instructions

## 2021-06-28 NOTE — Progress Notes (Signed)
Cardiology Office Note:    Date:  06/28/2021   ID:  James Iha Sr., DOB [birth year], MRN [id number]  PCP:  [name], MD  Cardiologist:  [name], MD    Referring MD: [name],*   No chief complaint on file. Doing very well  History of Present Illness:    James Herrington Sr. is a 88 y.o. male   who was initially referred to [place] because of sinus bradycardia, nonsustained ventricular tachycardia.  Work-up however has been negative that included stress test done more than a year ago.  Recently he ended up having some chest pain he went to [facility] he was find to be in atrial fibrillation with fast ventricular rate he also got some spell of troponin.  After that cardiac catheterization has been done which showed critical more than 90% lesion in the mid LAD.  Eventually he did have staged procedures to open up the artery he required Rotablator and of this lesion and stenting.  He comes today to my office for follow-up after that.  He was also found to be in atrial fibrillation he was put on Cardizem and converted spontaneously, he is not anticoagulated because of to significant fall that he sustained 1 resulted with face trauma and multiple broken teeth and second fall resulted in both wrist fracture. Comes today to my office for follow-up overall doing very well.  Denies have any chest pain tightness squeezing pressure mid chest.  Still trying to be active and walk around but very Fully  Past Medical History:  Diagnosis Date   Acquired hypothyroidism 07/09/9922   Acute systolic heart failure ([place]) 06/25/2020   Anemia 02/05/2020   boarderline anemic   Atopic dermatitis 12/05/2019   Bacterial pneumonia, unspecified 06/14/2012   06/14/2012 CXR w/ RML PNA >Zithromax and Rocephin     Bradycardia 11/28/2018   CAD (coronary artery disease) 07/07/2020   Cancer ([place])    prostate cancer-seed implant   Closed fracture dislocation of right elbow    Closed fracture of  right olecranon process 04/10/2020   COPD (chronic obstructive pulmonary disease) ([place]) 01/21/2020   Dermatitis 02/05/2020   Dizziness 02/19/2016   Essential hypertension 11/28/2018   History of prostate cancer    seed implant   Hyperlipemia    Hyperlipidemia, mixed 08/21/2008   Qualifier: Diagnosis of  By: Annamaria Boots MD, Clinton D    LVH (left ventricular hypertrophy) 11/28/2018   Malnutrition of moderate degree ([place]) 08/15/2019   Mixed conductive and sensorineural hearing loss, 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   Moderate persistent asthma, uncomplicated 2/68/3419   Nonsustained ventricular tachycardia 01/30/2019   NSTEMI (non-ST elevated myocardial infarction) ([place]) 06/21/2020   Other fatigue 02/05/2020   PAF (paroxysmal atrial fibrillation) ([place]) 06/25/2020   Seasonal and perennial allergic rhinitis 05/17/2007       Senile osteoporosis 08/15/2019   SINUSITIS, ACUTE 06/28/2007   Qualifier: Diagnosis of  By: Royal Piedra NP, Tammy     Ventricular ectopy 11/28/2018    Past Surgical History:  Procedure Laterality Date   APPENDECTOMY     CORONARY ATHERECTOMY N/A 06/24/2020   Procedure: CORONARY ATHERECTOMY;  Surgeon: Wellington Hampshire, MD;  Location: [facility];  Service: Cardiovascular;  Laterality: N/A;   INTRAVASCULAR ULTRASOUND/IVUS N/A 06/24/2020   Procedure: Intravascular Ultrasound/IVUS;  Surgeon: Wellington Hampshire, MD;  Location: [facility];  Service: Cardiovascular;  Laterality: N/A;   LEFT HEART CATH AND CORONARY ANGIOGRAPHY N/A 06/22/2020   Procedure: LEFT HEART CATH AND CORONARY  ANGIOGRAPHY;  Surgeon: Nelva Bush, MD;  Location: [facility];  Service: Cardiovascular;  Laterality: N/A;   ORIF ELBOW FRACTURE Right 04/10/2020   Procedure: OPEN REDUCTION INTERNAL FIXATION (ORIF) ELBOW/OLECRANON FRACTURE;  Surgeon: Marchia Bond, MD;  Location: [facility];  Service: Orthopedics;  Laterality: Right;    Current Medications: Current Meds  Medication Sig   acetaminophen  (TYLENOL) 325 MG tablet Take 2 tablets (650 mg total) by mouth every 6 (six) hours as needed. (Patient taking differently: Take 650 mg by mouth every 6 (six) hours as needed for mild pain or moderate pain.)   albuterol (VENTOLIN HFA) 108 (90 Base) MCG/ACT inhaler INHALE 2 PUFFS INTO THE LUNGS EVERY 6 HOURS AS NEEDED FOR WHEEZING OR SHORTNESS OF BREATH   alendronate (FOSAMAX) 70 MG tablet 1 TABLET BY MOUTH WEEKLY IN THE MORNING, AT LEAST 30 MIN BEFORE FIRST FOOD/BEVERAGE/MEDS OF DAY (Patient taking differently: Take 70 mg by mouth once a week.)   aspirin 81 MG EC tablet Take 1 tablet (81 mg total) by mouth daily. Swallow whole.   atorvastatin (LIPITOR) 80 MG tablet Take 1 tablet (80 mg total) by mouth daily.   clopidogrel (PLAVIX) 75 MG tablet Take 1 tablet (75 mg total) by mouth daily.   ferrous sulfate (FERROUSUL) 325 (65 FE) MG tablet Take 1 tablet (325 mg total) by mouth daily with breakfast.   ipratropium-albuterol (DUONEB) 0.5-2.5 (3) MG/3ML SOLN Take 3 mLs by nebulization every 4 (four) hours as needed (shortness of breath or wheezing).   metoprolol succinate (TOPROL-XL) 50 MG 24 hr tablet TAKE 1 TABLET BY MOUTH DAILY. TAKE WITH OR IMMEDIATELY FOLLOWING A MEAL.   Multiple Vitamins-Minerals (MULTIVITAMIN ADULT PO) Take 1 tablet by mouth daily. Unknown strength   Nutritional Supplements (BOOST NUTRITIONAL ENERGY PO) Take 237 mLs by mouth in the morning and at bedtime. Drinking 1-2 per day   SYNTHROID 175 MCG tablet TAKE 1 TABLET BY MOUTH DAILY BEFORE BREAKFAST.   triamcinolone (KENALOG) 0.1 % Apply 1 application topically 2 (two) times daily as needed (rash).     Allergies:   Clarithromycin   Social History   Socioeconomic History   Marital status: Married    Spouse name: Not on file   Number of children: Not on file   Years of education: Not on file   Highest education level: Not on file  Occupational History   Occupation: Retired    Fish farm manager: OTHER    Comment: Scientist, research (life sciences)   Tobacco Use   Smoking status: Former    Types: Cigarettes    Quit date: 1960    Years since quitting: 63.1   Smokeless tobacco: Former    Types: Chew    Quit date: 2011  Vaping Use   Vaping Use: Never used  Substance and Sexual Activity   Alcohol use: No   Drug use: No   Sexual activity: Not Currently  Other Topics Concern   Not on file  Social History Narrative   Not on file   Social Determinants of Health   Financial Resource Strain: Low Risk    Difficulty of Paying Living Expenses: Not hard at all  Food Insecurity: No Food Insecurity   Worried About Charity fundraiser in the Last Year: Never true   Arboriculturist in the Last Year: Never true  Transportation Needs: No Transportation Needs   Lack of Transportation (Medical): No   Lack of Transportation (Non-Medical): No  Physical Activity: Insufficiently Active   Days  of Exercise per Week: 7 days   Minutes of Exercise per Session: 10 min  Stress: No Stress Concern Present   Feeling of Stress : Not at all  Social Connections: Socially Integrated   Frequency of Communication with Friends and Family: Three times a week   Frequency of Social Gatherings with Friends and Family: Three times a week   Attends Religious Services: 1 to 4 times per year   Active Member of Clubs or Organizations: Yes   Attends Archivist Meetings: 1 to 4 times per year   Marital Status: Married     Family History: The patient's family history includes Alzheimer's disease in his father; Diabetes in his mother. ROS:   Please see the history of present illness.    All 14 point review of systems negative except as described per history of present illness  EKGs/Labs/Other Studies Reviewed:      Recent Labs: 04/28/2021: ALT 17; BUN 8; Creatinine, Ser 0.93; Hemoglobin 11.2; Platelets 238; Potassium 4.1; Sodium 139; TSH 1.710  Recent Lipid Panel    Component Value Date/Time   CHOL 117 04/28/2021 1147   TRIG 66 04/28/2021 1147    HDL 47 04/28/2021 1147   CHOLHDL 2.5 04/28/2021 1147   LDLCALC 56 04/28/2021 1147    Physical Exam:    VS:  BP 112/78 (BP Location: Right Arm)    Pulse 72    Ht 6' (1.829 m)    Wt 132 lb (59.9 kg)    SpO2 99%    BMI 17.90 kg/m     Wt Readings from Last 3 Encounters:  06/28/21 132 lb (59.9 kg)  04/28/21 131 lb (59.4 kg)  01/29/21 139 lb (63 kg)     GEN:  Well nourished, well developed in no acute distress HEENT: Normal NECK: No JVD; No carotid bruits LYMPHATICS: No lymphadenopathy CARDIAC: RRR, no murmurs, no rubs, no gallops RESPIRATORY:  Clear to auscultation without rales, wheezing or rhonchi  ABDOMEN: Soft, non-tender, non-distended MUSCULOSKELETAL:  No edema; No deformity  SKIN: Warm and dry LOWER EXTREMITIES: no swelling NEUROLOGIC:  Alert and oriented x 3 PSYCHIATRIC:  Normal affect   ASSESSMENT:    1. Essential hypertension   2. Ventricular ectopy   3. Coronary artery disease involving native coronary artery of native heart without angina pectoris   4. Chronic obstructive pulmonary disease, unspecified COPD type ([place])   5. PAF (paroxysmal atrial fibrillation) (H[facility])    PLAN:    In order of problems listed above:  Essential hypertension blood pressure well controlled continue present management. Coronary disease stable this is when he is on a very serial he is angioplasty therefore I told him to stop Plavix.  We will continue rest of the medication Dyslipidemia I did review K PN which show me his LDL of 56 and HDL 47.  We will continue high intense statin form of Lipitor 80. Paroxysmal atrial fibrillation, denies having a palpitations.  Continue present management he is not anticoagulated because of frequent falls and fragility   Medication Adjustments/Labs and Tests Ordered: Current medicines are reviewed at length with the patient today.  Concerns regarding medicines are outlined above.  No orders of the defined types were placed in this  encounter.  Medication changes: No orders of the defined types were placed in this encounter.   Signed, Park Liter, MD, [place] 06/28/2021 10:51 AM    [place]

## 2021-07-04 ENCOUNTER — Other Ambulatory Visit: Payer: Self-pay | Admitting: Internal Medicine

## 2021-07-22 ENCOUNTER — Telehealth: Payer: Self-pay

## 2021-07-22 NOTE — Chronic Care Management (AMB) (Signed)
? ? ?Chronic Care Management ?Pharmacy Assistant  ? ?Name: James Wendorf Sr.  MRN: [id number] DOB: [birth year] ? ?Reason for Encounter: Disease State/ Hypertension ? ?Recent office visits:  ?04-28-2021 [name], MD. RBC= 3.99, Hemo= 11.2, Hema= 35.9, MCHC= 31.2. BUN/Creatinine= 9. Follow up in 6 months. ? ?01-29-2021 Burna Forts, CMA. Medicare annual visit. ? ?Recent consult visits:  ?06-28-2021 Park Liter, MD (Cardiology). STOP Plavix. Follow up in 6 months. ? ?Hospital visits:  ?None in previous 6 months ? ?Medications: ?Outpatient Encounter Medications as of 07/22/2021  ?Medication Sig  ? albuterol (VENTOLIN HFA) 108 (90 Base) MCG/ACT inhaler INHALE 2 PUFFS INTO THE LUNGS EVERY 6 HOURS AS NEEDED FOR WHEEZING OR SHORTNESS OF BREATH  ? acetaminophen (TYLENOL) 325 MG tablet Take 2 tablets (650 mg total) by mouth every 6 (six) hours as needed. (Patient taking differently: Take 650 mg by mouth every 6 (six) hours as needed for mild pain or moderate pain.)  ? alendronate (FOSAMAX) 70 MG tablet 1 TABLET BY MOUTH WEEKLY IN THE MORNING, AT LEAST 30 MIN BEFORE FIRST FOOD/BEVERAGE/MEDS OF DAY (Patient taking differently: Take 70 mg by mouth once a week.)  ? aspirin 81 MG EC tablet Take 1 tablet (81 mg total) by mouth daily. Swallow whole.  ? atorvastatin (LIPITOR) 80 MG tablet Take 1 tablet (80 mg total) by mouth daily.  ? clopidogrel (PLAVIX) 75 MG tablet Take 1 tablet (75 mg total) by mouth daily.  ? ferrous sulfate (FERROUSUL) 325 (65 FE) MG tablet Take 1 tablet (325 mg total) by mouth daily with breakfast.  ? ipratropium-albuterol (DUONEB) 0.5-2.5 (3) MG/3ML SOLN Take 3 mLs by nebulization every 4 (four) hours as needed (shortness of breath or wheezing).  ? metoprolol succinate (TOPROL-XL) 50 MG 24 hr tablet TAKE 1 TABLET BY MOUTH DAILY. TAKE WITH OR IMMEDIATELY FOLLOWING A MEAL.  ? Multiple Vitamins-Minerals (MULTIVITAMIN ADULT PO) Take 1 tablet by mouth daily. Unknown strength  ? Nutritional  Supplements (BOOST NUTRITIONAL ENERGY PO) Take 237 mLs by mouth in the morning and at bedtime. Drinking 1-2 per day  ? SYNTHROID 175 MCG tablet TAKE 1 TABLET BY MOUTH DAILY BEFORE BREAKFAST.  ? triamcinolone (KENALOG) 0.1 % Apply 1 application topically 2 (two) times daily as needed (rash).  ? ?No facility-administered encounter medications on file as of 07/22/2021.  ? ? ?Recent Office Vitals: ?BP Readings from Last 3 Encounters:  ?06/28/21 112/78  ?04/28/21 100/60  ?01/29/21 (!) 109/56  ? ?Pulse Readings from Last 3 Encounters:  ?06/28/21 72  ?04/28/21 76  ?01/29/21 61  ?  ?Wt Readings from Last 3 Encounters:  ?06/28/21 132 lb (59.9 kg)  ?04/28/21 131 lb (59.4 kg)  ?01/29/21 139 lb (63 kg)  ?  ? ?Kidney Function ?Lab Results  ?Component Value Date/Time  ? CREATININE 0.93 04/28/2021 11:47 AM  ? CREATININE 0.92 12/15/2020 10:30 AM  ? GFRNONAA 78 06/29/2020 12:00 AM  ? GFRNONAA >60 06/25/2020 04:45 AM  ? GFRAA 90 06/29/2020 12:00 AM  ? ? ?BMP Latest Ref Rng & Units 04/28/2021 12/15/2020 06/29/2020  ?Glucose 70 - 99 mg/dL 97 98 82  ?BUN 8 - 27 mg/dL '8 16 12  '$ ?Creatinine 0.76 - 1.27 mg/dL 0.93 0.92 0.88  ?BUN/Creat Ratio 10 - 24 9(L) 17 14  ?Sodium 134 - 144 mmol/L 139 137 134  ?Potassium 3.5 - 5.2 mmol/L 4.1 4.4 4.9  ?Chloride 96 - 106 mmol/L 103 102 99  ?CO2 20 - 29 mmol/L '22 22 21  '$ ?Calcium 8.6 -  10.2 mg/dL 9.2 9.4 8.9  ? ? ? ?07-22-2021: 1st attempt left VM ?07-26-2021: 2nd attempt left VM ?07-27-2021: 3rd attempt left VM ? ?Care Gaps: ?Last annual wellness visit?  01-29-2021 ?Shingrix overdue ?Covid booster overdue ?Flu vaccine overdue ? ?Star Rating Drugs: ?Atorvastatin 80 mg- Last filled 06-19-2021 90 DS  ? ?Malecca Hicks CMA ?Clinical Pharmacist Assistant ?[phone] ? ?

## 2021-07-29 ENCOUNTER — Ambulatory Visit: Payer: Medicare HMO | Admitting: Internal Medicine

## 2021-08-23 DIAGNOSIS — H52223 Regular astigmatism, bilateral: Secondary | ICD-10-CM | POA: Diagnosis not present

## 2021-08-23 DIAGNOSIS — H5203 Hypermetropia, bilateral: Secondary | ICD-10-CM | POA: Diagnosis not present

## 2021-08-23 DIAGNOSIS — H4010X1 Unspecified open-angle glaucoma, mild stage: Secondary | ICD-10-CM | POA: Diagnosis not present

## 2021-08-24 ENCOUNTER — Ambulatory Visit: Payer: Medicare HMO | Admitting: Internal Medicine

## 2021-08-30 ENCOUNTER — Telehealth: Payer: Self-pay

## 2021-08-30 NOTE — Chronic Care Management (AMB) (Signed)
? ? ?Chronic Care Management ?Pharmacy Assistant  ? ?Name: James Laraia Sr.  MRN: [id number] DOB: 18-Jun-1931 ? ?Reason for Encounter: Disease State/ Hypertension ? ?Recent office visits:  ?04-28-2021 [name], MD. RBC= 3.99, Hemo= 11.2, Hema= 35.9, MCHC= 31.2. BUN/Creatinine= 9. Follow up in 6 months. ? ?Recent consult visits:  ?06-28-2021 Park Liter, MD (Cardiology). STOP Plavix. Follow up in 6 months. ? ?Hospital visits:  ?None in previous 6 months ? ?Medications: ?Outpatient Encounter Medications as of 08/30/2021  ?Medication Sig  ? albuterol (VENTOLIN HFA) 108 (90 Base) MCG/ACT inhaler INHALE 2 PUFFS INTO THE LUNGS EVERY 6 HOURS AS NEEDED FOR WHEEZING OR SHORTNESS OF BREATH  ? acetaminophen (TYLENOL) 325 MG tablet Take 2 tablets (650 mg total) by mouth every 6 (six) hours as needed. (Patient taking differently: Take 650 mg by mouth every 6 (six) hours as needed for mild pain or moderate pain.)  ? alendronate (FOSAMAX) 70 MG tablet 1 TABLET BY MOUTH WEEKLY IN THE MORNING, AT LEAST 30 MIN BEFORE FIRST FOOD/BEVERAGE/MEDS OF DAY (Patient taking differently: Take 70 mg by mouth once a week.)  ? aspirin 81 MG EC tablet Take 1 tablet (81 mg total) by mouth daily. Swallow whole.  ? atorvastatin (LIPITOR) 80 MG tablet Take 1 tablet (80 mg total) by mouth daily.  ? clopidogrel (PLAVIX) 75 MG tablet Take 1 tablet (75 mg total) by mouth daily.  ? ferrous sulfate (FERROUSUL) 325 (65 FE) MG tablet Take 1 tablet (325 mg total) by mouth daily with breakfast.  ? ipratropium-albuterol (DUONEB) 0.5-2.5 (3) MG/3ML SOLN Take 3 mLs by nebulization every 4 (four) hours as needed (shortness of breath or wheezing).  ? metoprolol succinate (TOPROL-XL) 50 MG 24 hr tablet TAKE 1 TABLET BY MOUTH DAILY. TAKE WITH OR IMMEDIATELY FOLLOWING A MEAL.  ? Multiple Vitamins-Minerals (MULTIVITAMIN ADULT PO) Take 1 tablet by mouth daily. Unknown strength  ? Nutritional Supplements (BOOST NUTRITIONAL ENERGY PO) Take 237 mLs by  mouth in the morning and at bedtime. Drinking 1-2 per day  ? SYNTHROID 175 MCG tablet TAKE 1 TABLET BY MOUTH DAILY BEFORE BREAKFAST.  ? triamcinolone (KENALOG) 0.1 % Apply 1 application topically 2 (two) times daily as needed (rash).  ? ?No facility-administered encounter medications on file as of 08/30/2021.  ? ?Recent Office Vitals: ?BP Readings from Last 3 Encounters:  ?06/28/21 112/78  ?04/28/21 100/60  ?01/29/21 (!) 109/56  ? ?Pulse Readings from Last 3 Encounters:  ?06/28/21 72  ?04/28/21 76  ?01/29/21 61  ?  ?Wt Readings from Last 3 Encounters:  ?06/28/21 132 lb (59.9 kg)  ?04/28/21 131 lb (59.4 kg)  ?01/29/21 139 lb (63 kg)  ?  ? ?Kidney Function ?Lab Results  ?Component Value Date/Time  ? CREATININE 0.93 04/28/2021 11:47 AM  ? CREATININE 0.92 12/15/2020 10:30 AM  ? GFRNONAA 78 06/29/2020 12:00 AM  ? GFRNONAA >60 06/25/2020 04:45 AM  ? GFRAA 90 06/29/2020 12:00 AM  ? ? ? ?  Latest Ref Rng & Units 04/28/2021  ? 11:47 AM 12/15/2020  ? 10:30 AM 06/29/2020  ? 12:00 AM  ?BMP  ?Glucose 70 - 99 mg/dL 97   98   82    ?BUN 8 - 27 mg/dL '8   16   12    '$ ?Creatinine 0.76 - 1.27 mg/dL 0.93   0.92   0.88    ?BUN/Creat Ratio 10 - '24 9   17   14    '$ ?Sodium 134 - 144 mmol/L 139  137   134    ?Potassium 3.5 - 5.2 mmol/L 4.1   4.4   4.9    ?Chloride 96 - 106 mmol/L 103   102   99    ?CO2 20 - 29 mmol/L '22   22   21    '$ ?Calcium 8.6 - 10.2 mg/dL 9.2   9.4   8.9    ? ? ? ?Current antihypertensive regimen:  ?Metoprolol 50 mg daily ? Patient's daughter verbally confirms he is taking the above medications as directed. Yes ? ?How often are you checking your Blood Pressure? 1-2x per week ? ?he checks his blood pressure in the morning after taking his medication. ? ?Current home BP readings: 119/73 63  ?Wrist or arm cuff: cuff ?Caffeine intake: Patient's daughter states 1 cup of coffee  ?Salt intake: Limited ?OTC medications including pseudoephedrine or NSAIDs? Tylenol ? ?Any readings above 180/120? No ? ?What recent interventions/DTPs  have been made by any provider to improve Blood Pressure control since last CPP Visit:  ?None ? ?Any recent hospitalizations or ED visits since last visit with CPP? No ? ?What diet changes have been made to improve Blood Pressure Control?  ?Patient's daughter states patient has limited his salt intake and drinks plenty of water. ? ?What exercise is being done to improve your Blood Pressure Control?  ?Patient's daughter states he does arm exercises and walking.  ? ?Adherence Review: ?Is the patient currently on ACE/ARB medication? No ?Does the patient have >5 day gap between last estimated fill dates? CPP to review ? ?Care Gaps: ?Last annual wellness visit? 01-29-2021 ?Shingrix overdue ?Covid booster overdue ?Tdap overdue ? ?Star Rating Drugs: ?Atorvastatin 80 mg- Last filled 06-19-2021 90 DS  ?  ?Malecca Hicks CMA ?Clinical Pharmacist Assistant ?[phone] ? ?

## 2021-08-31 ENCOUNTER — Ambulatory Visit (INDEPENDENT_AMBULATORY_CARE_PROVIDER_SITE_OTHER): Payer: Medicare HMO

## 2021-08-31 DIAGNOSIS — E039 Hypothyroidism, unspecified: Secondary | ICD-10-CM

## 2021-08-31 DIAGNOSIS — E782 Mixed hyperlipidemia: Secondary | ICD-10-CM

## 2021-08-31 DIAGNOSIS — I10 Essential (primary) hypertension: Secondary | ICD-10-CM

## 2021-08-31 NOTE — Progress Notes (Signed)
? ?Chronic Care Management ?Pharmacy Note ? ?08/31/2021 ?Name:  James Roussel Sr. MRN:  [id number] DOB:  05/24/1931 ? ?Summary: ?-Patient is very mobile, able to drive on his own and drives to his Doctor appointments. He has 2 children, daughter and son, who will try to accompany him to these. ? ?Recommendations/Changes made from today's visit: ?-Onboard to [facility] ?-Patient has been on Alendronate since 2009, recommend changing to alternative agent ?-Patient hasn't gotten a DEXA since 2019, recommend repeat DEXA ?-Due to Osteo, recommend TSH goal of 3-4. Recommend decreasing dose to prevent bone loss ? ?Subjective: ?James Bembenek Sr. is an 88 y.o. year old male who is a primary patient of Henrene Pastor, Zeb Comfort, MD.  The CCM team was consulted for assistance with disease management and care coordination needs.   ? ?Engaged with patient by telephone for follow up visit in response to provider referral for pharmacy case management and/or care coordination services.  ? ?Consent to Services:  ?The patient was given the following information about Chronic Care Management services today, agreed to services, and gave verbal consent: 1. CCM service includes personalized support from designated clinical staff supervised by the primary care provider, including individualized plan of care and coordination with other care providers 2. 24/7 contact phone numbers for assistance for urgent and routine care needs. 3. Service will only be billed when office clinical staff spend 20 minutes or more in a month to coordinate care. 4. Only one practitioner may furnish and bill the service in a calendar month. 5.The patient may stop CCM services at any time (effective at the end of the month) by phone call to the office staff. 6. The patient will be responsible for cost sharing (co-pay) of up to 20% of the service fee (after annual deductible is met). Patient agreed to services and consent obtained. ? ?Patient Care Team: ?[name], MD as PCP - General (Family Medicine) ?Park Liter, MD as PCP - Cardiology (Cardiology) ?Lane Hacker, [facility] (Pharmacist) ? ?Recent office visits:  ?04-28-2021 [name], MD. RBC= 3.99, Hemo= 11.2, Hema= 35.9, MCHC= 31.2. BUN/Creatinine= 9. Follow up in 6 months. ?  ?Recent consult visits:  ?06-28-2021 Park Liter, MD (Cardiology). STOP Plavix. Follow up in 6 months. ?  ?Hospital visits:  ?None in previous 6 months ? ? ?Objective: ? ?Lab Results  ?Component Value Date  ? CREATININE 0.93 04/28/2021  ? BUN 8 04/28/2021  ? EGFR 79 04/28/2021  ? GFRNONAA 78 06/29/2020  ? GFRAA 90 06/29/2020  ? NA 139 04/28/2021  ? K 4.1 04/28/2021  ? CALCIUM 9.2 04/28/2021  ? CO2 22 04/28/2021  ? GLUCOSE 97 04/28/2021  ? ? ?Lab Results  ?Component Value Date/Time  ? HGBA1C 6.3 (H) 06/22/2020 05:13 AM  ?  ?Last diabetic Eye exam: No results found for: HMDIABEYEEXA  ?Last diabetic Foot exam: No results found for: HMDIABFOOTEX  ? ?Lab Results  ?Component Value Date  ? CHOL 117 04/28/2021  ? HDL 47 04/28/2021  ? [place] 56 04/28/2021  ? TRIG 66 04/28/2021  ? CHOLHDL 2.5 04/28/2021  ? ? ? ?  Latest Ref Rng & Units 04/28/2021  ? 11:47 AM 12/15/2020  ? 10:30 AM 09/08/2020  ? 10:16 AM  ?Hepatic Function  ?Total Protein 6.0 - 8.5 g/dL 6.3   6.6   6.2    ?Albumin 3.6 - 4.6 g/dL 4.1   4.4   4.1    ?AST 0 - 40 IU/L 22  28   24    ?ALT 0 - 44 IU/L '17   22   22    ' ?Alk Phosphatase 44 - 121 IU/L 64   73   68    ?Total Bilirubin 0.0 - 1.2 mg/dL 0.3   0.3   0.3    ?Bilirubin, Direct 0.00 - 0.40 mg/dL   <0.10    ? ? ?Lab Results  ?Component Value Date/Time  ? TSH 1.710 04/28/2021 11:47 AM  ? TSH 3.870 01/25/2021 11:49 AM  ? FREET4 1.66 08/15/2019 09:59 AM  ? ? ? ?  Latest Ref Rng & Units 04/28/2021  ? 11:47 AM 12/15/2020  ? 10:30 AM 07/07/2020  ? 11:38 AM  ?CBC  ?WBC 3.4 - 10.8 x10E3/uL 6.2   9.3   9.6    ?Hemoglobin 13.0 - 17.7 g/dL 11.2   11.8   9.6    ?Hematocrit 37.5 - 51.0 % 35.9   36.2   30.4    ?Platelets  150 - 450 x10E3/uL 238   209   309    ? ? ?No results found for: VD25OH ? ?Clinical ASCVD: Yes  ?The ASCVD Risk score (Arnett DK, et al., 2019) failed to calculate for the following reasons: ?  The 2019 ASCVD risk score is only valid for ages 19 to 6 ?  The patient has a prior MI or stroke diagnosis   ? ? ?  01/29/2021  ?  1:14 PM 12/15/2020  ? 10:03 AM 09/04/2019  ? 11:25 AM  ?Depression screen PHQ 2/9  ?Decreased Interest 0 0 0  ?Down, Depressed, Hopeless 0 0 0  ?PHQ - 2 Score 0 0 0  ?  ? ?Other: (CHADS2VASc if Afib, MMRC or CAT for COPD, ACT, DEXA) ? ?Social History  ? ?Tobacco Use  ?Smoking Status Former  ? Types: Cigarettes  ? Quit date: 77  ? Years since quitting: 63.3  ?Smokeless Tobacco Former  ? Types: Chew  ? Quit date: 2011  ? ?BP Readings from Last 3 Encounters:  ?06/28/21 112/78  ?04/28/21 100/60  ?01/29/21 (!) 109/56  ? ?Pulse Readings from Last 3 Encounters:  ?06/28/21 72  ?04/28/21 76  ?01/29/21 61  ? ?Wt Readings from Last 3 Encounters:  ?06/28/21 132 lb (59.9 kg)  ?04/28/21 131 lb (59.4 kg)  ?01/29/21 139 lb (63 kg)  ? ?BMI Readings from Last 3 Encounters:  ?06/28/21 17.90 kg/m?  ?04/28/21 17.28 kg/m?  ?01/29/21 18.34 kg/m?  ? ? ?Assessment/Interventions: Review of patient past medical history, allergies, medications, health status, including review of consultants reports, laboratory and other test data, was performed as part of comprehensive evaluation and provision of chronic care management services.  ? ?SDOH:  (Social Determinants of Health) assessments and interventions performed: Yes ?SDOH Interventions   ? ?Flowsheet Row Most Recent Value  ?SDOH Interventions   ?Financial Strain Interventions Intervention Not Indicated  ?Transportation Interventions Intervention Not Indicated  ? ?  ? ?SDOH Screenings  ? ?Alcohol Screen: Not on file  ?Depression (PHQ2-9): Low Risk   ? PHQ-2 Score: 0  ?Financial Resource Strain: Low Risk   ? Difficulty of Paying Living Expenses: Not hard at all  ?Food  Insecurity: No Food Insecurity  ? Worried About Charity fundraiser in the Last Year: Never true  ? Ran Out of Food in the Last Year: Never true  ?Housing: Low Risk   ? Last Housing Risk Score: 0  ?Physical Activity: Insufficiently Active  ? Days of Exercise per Week:  7 days  ? Minutes of Exercise per Session: 10 min  ?Social Connections: Socially Integrated  ? Frequency of Communication with Friends and Family: Three times a week  ? Frequency of Social Gatherings with Friends and Family: Three times a week  ? Attends Religious Services: 1 to 4 times per year  ? Active Member of Clubs or Organizations: Yes  ? Attends Archivist Meetings: 1 to 4 times per year  ? Marital Status: Married  ?Stress: No Stress Concern Present  ? Feeling of Stress : Not at all  ?Tobacco Use: Medium Risk  ? Smoking Tobacco Use: Former  ? Smokeless Tobacco Use: Former  ? Passive Exposure: Not on file  ?Transportation Needs: No Transportation Needs  ? Lack of Transportation (Medical): No  ? Lack of Transportation (Non-Medical): No  ? ? ?CCM Care Plan ? ?Allergies  ?Allergen Reactions  ? Clarithromycin   ?  REACTION: GI upset  ? ? ?Medications Reviewed Today   ? ? Reviewed by Lane Hacker, [facility] (Pharmacist) on 08/31/21 at [place] List Status: <None>  ? ?Medication Order Taking? Sig Documenting Provider Last Dose Status Informant  ?acetaminophen (TYLENOL) 325 MG tablet [id number]  Take 2 tablets (650 mg total) by mouth every 6 (six) hours as needed.  ?Patient taking differently: Take 650 mg by mouth every 6 (six) hours as needed for mild pain or moderate pain.  ? Ventura Bruns, PA-C  Active   ?albuterol (VENTOLIN HFA) 108 (90 Base) MCG/ACT inhaler [id number]  INHALE 2 PUFFS INTO THE LUNGS EVERY 6 HOURS AS NEEDED FOR WHEEZING OR SHORTNESS OF BREATH Young, Clinton D, MD  Active   ?alendronate (FOSAMAX) 70 MG tablet [id number] Yes 1 TABLET BY MOUTH WEEKLY IN THE MORNING, AT LEAST 30 MIN BEFORE FIRST FOOD/BEVERAGE/MEDS OF DAY   ?Patient taking differently: Take 70 mg by mouth once a week.  ? [name], MD Taking Active   ?aspirin 81 MG EC tablet [id number] Yes Take 1 tablet (81 mg total) by mouth daily. Swallow whole. Agustin Cree,

## 2021-08-31 NOTE — Patient Instructions (Signed)
Visit Information ? ? Goals Addressed   ?None ?  ? ?Patient Care Plan: [name]  ?  ? ?Problem Identified: COPD, BP, Thyroid   ?Priority: High  ?  ? ?Long-Range Goal: Disease State Management   ?Start Date: 08/31/2021  ?Expected End Date: 08/31/2022  ?This Visit's Progress: On track  ?Priority: High  ?Note:   ?Current Barriers:  ?Unable to self administer medications as prescribed ? ?Pharmacist Clinical Goal(s):  ?Patient will achieve ability to self administer medications as prescribed through use of packs as evidenced by patient report through collaboration with PharmD and provider.  ? ?Interventions: ?1:1 collaboration with James Anes, MD regarding development and update of comprehensive plan of care as evidenced by provider attestation and co-signature ?Inter-disciplinary care team collaboration (see longitudinal plan of care) ?Comprehensive medication review performed; medication list updated in electronic medical record ? ?Hyperlipidemia: (LDL goal < 70) ?The ASCVD Risk score (Arnett DK, et al., 2019) failed to calculate for the following reasons: ?  The 2019 ASCVD risk score is only valid for ages 60 to 71 ?  The patient has a prior MI or stroke diagnosis ?Lab Results  ?Component Value Date  ? CHOL 117 04/28/2021  ? CHOL 127 12/15/2020  ? CHOL 123 09/08/2020  ? ?Lab Results  ?Component Value Date  ? HDL 47 04/28/2021  ? HDL 47 12/15/2020  ? HDL 43 09/08/2020  ? ?Lab Results  ?Component Value Date  ? [place] 56 04/28/2021  ? [place] 64 12/15/2020  ? [place] 64 09/08/2020  ? ?Lab Results  ?Component Value Date  ? TRIG 66 04/28/2021  ? TRIG 80 12/15/2020  ? TRIG 82 09/08/2020  ? ?Lab Results  ?Component Value Date  ? CHOLHDL 2.5 04/28/2021  ? CHOLHDL 2.7 12/15/2020  ? CHOLHDL 2.9 09/08/2020  ?No results found for: LDLDIRECT ?-Controlled ?-Current treatment: ?Atorvastatin '80mg'$  Appropriate, Effective, Safe, Accessible ?-Medications previously tried: N/A  ?-Current dietary patterns: "Tries  to eat healthy" ?-Current exercise habits: N/A ?-Educated on Cholesterol goals;  ?-Recommended to continue current medication ? ?Atrial Fibrillation (Goal: prevent stroke and major bleeding) ?-Controlled ?-CHADSVASC: 5 ?-Current treatment: ?Rate control:  ?Metoprolol Succ '50mg'$  Appropriate, Effective, Safe, Accessible ?Anticoagulation: Only ASA due to fall risk ?ASA '81mg'$  Appropriate, Effective, Safe, Accessible ?-Medications previously tried: Clopidogrel ?-Home BP and HR readings: Didn't have readings  ?-Counseled on seeking medical attention after a head injury or if there is blood in the urine/stool; ?-Recommended to continue current medication ? ? ?Osteoporosis / Osteopenia (Goal Prevent fractures) ?-Not ideally controlled ?-Last DEXA Scan: March 2019 (Unable to find results) ?-Patient is a candidate for pharmacologic treatment due to   ?-Current treatment  ?Alendronate (Since 2009) Appropriate, Effective, Safe, Accessible ?-Medications previously tried: N/A  ?-Recommend [phone] units of vitamin D daily. ?April 2023: Will ask PCP about Alendronate length of therapy ? ?Thyroid (Goal TSH: 0.4-5.0) ?Lab Results  ?Component Value Date  ? TSH 1.710 04/28/2021  ?-Controlled ?-Current treatment: ?Synthroid 163mg Appropriate, Effective, Query Safe, Accessible ?-Counseled to take medication on an empty stomach ?April 2023: Recommend decreasing dose for a goal on the higher end of 0.4-5.0 due to osteo risk ? ? ?Patient Goals/Self-Care Activities ?Patient will:  ?- take medications as prescribed as evidenced by patient report and record review ? ?Follow Up Plan: The patient has been provided with contact information for the care management team and has been advised to call with any health related questions or concerns.  ? ?CPP F/U October 2023 ? ?NOvid Curd  [name].D. - [phone] ? ? ?  ? ? ?[name] was given information about Chronic Care Management services today including:  ?CCM service includes personalized  support from designated clinical staff supervised by his physician, including individualized plan of care and coordination with other care providers ?24/7 contact phone numbers for assistance for urgent and routine care needs. ?Standard insurance, coinsurance, copays and deductibles apply for chronic care management only during months in which we provide at least 20 minutes of these services. Most insurances cover these services at 100%, however patients may be responsible for any copay, coinsurance and/or deductible if applicable. This service may help you avoid the need for more expensive face-to-face services. ?Only one practitioner may furnish and bill the service in a calendar month. ?The patient may stop CCM services at any time (effective at the end of the month) by phone call to the office staff. ? ?Patient agreed to services and verbal consent obtained.  ? ?The patient verbalized understanding of instructions, educational materials, and care plan provided today and declined offer to receive copy of patient instructions, educational materials, and care plan.  ?The pharmacy team will reach out to the patient again over the next 60 days.  ? ?[name], [place] ? ?

## 2021-09-01 ENCOUNTER — Other Ambulatory Visit: Payer: Self-pay | Admitting: Legal Medicine

## 2021-09-02 NOTE — Progress Notes (Signed)
Updated onboarding sheet per daughter's VM ?

## 2021-09-05 DIAGNOSIS — I10 Essential (primary) hypertension: Secondary | ICD-10-CM | POA: Diagnosis not present

## 2021-09-05 DIAGNOSIS — E782 Mixed hyperlipidemia: Secondary | ICD-10-CM | POA: Diagnosis not present

## 2021-09-05 DIAGNOSIS — E039 Hypothyroidism, unspecified: Secondary | ICD-10-CM

## 2021-09-08 NOTE — Progress Notes (Signed)
?Subjective:  ? ? Patient ID: [name], male    DOB: December 12, 1931, 88 y.o.   MRN: [id number] ? ?HPI ? male never smoker followed for allergic rhinitis, mild intermittent asthma, history sinusitis, complicated by history prostate cancer, hyperlipidemia, hypothyroid, PAFIib/ Plavix ,NSTEM MI/ CAD/ Stents, HTN,  Dizziness, Anemia,   ?FENO 08/22/16- 19- WNL ?Office Spirometry 08/22/16- WNL- FVC 3.65/ 85%, FEV1 2.70-/ 89%, ratio 0.74, FEF 25-75% 2.03/ 98% ?------------------------------------------------------------------------------------------ ? ? ?07/28/20- 88 year old male never smoker followed for allergic Rhinitis, mild Asthma, history Sinusitis, complicated by history Prostate Cancer, Hyperlipidemia, Hypothyroid, HTN, Bradycardia PAFIib/ Plavix ,NSTEM MI/ CAD/ Stents, HTN,  Dizziness, Anemia, Hypothyroid,  ?-Ventolin hfa, Neb Duoneb, Nasacort, ?Covid vax-     3 Phizer                                               Son  here ?Flu vax-had ?-----Pt had a heart attack in February , son states  his voice has been hoarse since then. Breathing has been okay. ?ACT score- 21 ?Says breathing is "fine" and son concurs. ?CXR 2/13/222-  ?FINDINGS: ?The heart size and mediastinal contours are within normal limits. ?Lungs are hyperexpanded both lungs are clear. The visualized ?skeletal structures are unremarkable. Aortic calcifications are ?noted. ?IMPRESSION: ?No active cardiopulmonary disease. ? ?09/09/21-  88 year old male former smoker followed for allergic Rhinitis, mild Asthma, history Sinusitis, complicated by history Prostate Cancer, Hyperlipidemia, Hypothyroid, HTN, Bradycardia PAFIib/ Plavix ,NSTEM MI/ CAD/ Stents, HTN,  Dizziness, Anemia, Hypothyroid,  ?-Ventolin hfa, Neb Duoneb, Nasacort, ?Covid vax-     3 Phizer                                              ?Flu vax-had ?ACT score 20 ?He feels he is doing very well for having had his 89th birthday yesterday.  No significant respiratory events.  Daughter is here  and she confirms.  He uses nebulizer machine a couple of times a week during the pollen season now if trying to work any outdoors.  Using rescue inhaler once or twice a week.  Cardiac status reported stable. ? ?ROS-see HPI  + = positive ?Constitutional:   +  weight loss, night sweats, fevers, chills, fatigue, lassitude. ?HEENT:   No-  headaches, difficulty swallowing, tooth/dental problems, sore throat,  ?     No-  sneezing, itching, ear ache, +nasal congestion, +post nasal drip,  ?CV:  No-   chest pain, orthopnea, PND, swelling in lower extremities, anasarca, dizziness, palpitations ?Resp: + shortness of breath with exertion or at rest.   ?            productive cough,   non-productive cough,  No- coughing up of blood.   ?        change in color of mucus.  No- wheezing.   ?Skin: No-   rash or lesions. ?GI:  No-   heartburn, indigestion, abdominal pain, nausea, vomiting,  ?GU:  ?MS:  No-   joint pain or swelling.   ?Neuro-     nothing unusual ?Psych:  No- change in mood or affect. No depression or anxiety.  No memory loss. ? ?OBJ- Physical Exam ?General- Alert, Oriented, Affect-appropriate, Distress- none acute. + Thin, talkative ?Skin- rash-none, lesions-  none, excoriation- none ?Lymphadenopathy- none ?Head- atraumatic ?           Eyes- Gross vision intact, PERRLA, conjunctivae and secretions clear ?           Ears-  + very HOH, + hearing aids ?           Nose- Clear, no-Septal dev, mucus, polyps, erosion, perforation  ?           Throat- Mallampati II , mucosa -clear, drainage-none seen, tonsils- atrophic, Weak voice ?Neck- flexible , trachea midline, no stridor , thyroid nl, carotid no bruit ?Chest - symmetrical excursion , unlabored ?          Heart/CV- RRR/ rare skip , no murmur , no gallop  , no rub, nl s1 s2 ?                          - JVD- none , edema- none, stasis changes- none, varices- none ?          Lung- +distant/unlabored, wheeze- none , dullness-none, rub- none ?          Chest wall-  ?Abd-  ?Br/  Gen/ Rectal- Not done, not indicated ?Extrem- + R forearm cast ?Neuro- grossly intact to observation ? ? ? ? ?

## 2021-09-09 ENCOUNTER — Ambulatory Visit: Payer: Medicare HMO | Admitting: Internal Medicine

## 2021-09-09 ENCOUNTER — Encounter: Payer: Self-pay | Admitting: Internal Medicine

## 2021-09-09 DIAGNOSIS — I2510 Atherosclerotic heart disease of native coronary artery without angina pectoris: Secondary | ICD-10-CM

## 2021-09-09 DIAGNOSIS — J4540 Moderate persistent asthma, uncomplicated: Secondary | ICD-10-CM | POA: Diagnosis not present

## 2021-09-09 MED ORDER — ALBUTEROL SULFATE HFA 108 (90 BASE) MCG/ACT IN AERS: 2.0000 | INHALATION_SPRAY | Freq: Four times a day (QID) | RESPIRATORY_TRACT | 12 refills | Status: DC | PRN

## 2021-09-09 NOTE — Patient Instructions (Signed)
Glad you are doing well. ? ?Refill script sent for albuterol rescue inhaler ?

## 2021-09-09 NOTE — Assessment & Plan Note (Signed)
Intermittent and uncomplicated.  Current meds are comfortable and adequate. ?Plan-albuterol inhaler refilled. ?

## 2021-09-09 NOTE — Assessment & Plan Note (Signed)
He denies any acute changes or concerns.  Cardiology follows. ?

## 2021-09-15 DIAGNOSIS — S52021D Displaced fracture of olecranon process without intraarticular extension of right ulna, subsequent encounter for closed fracture with routine healing: Secondary | ICD-10-CM | POA: Diagnosis not present

## 2021-09-22 ENCOUNTER — Telehealth: Payer: Self-pay

## 2021-09-22 ENCOUNTER — Other Ambulatory Visit: Payer: Self-pay

## 2021-09-22 ENCOUNTER — Other Ambulatory Visit: Payer: Self-pay | Admitting: Legal Medicine

## 2021-09-22 DIAGNOSIS — E039 Hypothyroidism, unspecified: Secondary | ICD-10-CM

## 2021-09-22 MED ORDER — LEVOTHYROXINE SODIUM 175 MCG PO TABS: 175.0000 ug | ORAL_TABLET | Freq: Every day | ORAL | 1 refills | Status: DC

## 2021-09-22 MED ORDER — ALBUTEROL SULFATE HFA 108 (90 BASE) MCG/ACT IN AERS: 2.0000 | INHALATION_SPRAY | Freq: Four times a day (QID) | RESPIRATORY_TRACT | 12 refills | Status: DC | PRN

## 2021-09-22 MED ORDER — ATORVASTATIN CALCIUM 80 MG PO TABS
80.0000 mg | ORAL_TABLET | Freq: Every day | ORAL | 1 refills | Status: DC
Start: 2021-09-22 — End: 2022-02-14

## 2021-09-22 MED ORDER — IPRATROPIUM-ALBUTEROL 0.5-2.5 (3) MG/3ML IN SOLN: 3.0000 mL | RESPIRATORY_TRACT | 2 refills | Status: DC | PRN

## 2021-09-22 MED ORDER — FERROUS SULFATE 325 (65 FE) MG PO TABS
325.0000 mg | ORAL_TABLET | Freq: Every day | ORAL | 12 refills | Status: DC
Start: 2021-09-22 — End: 2022-10-12

## 2021-09-22 MED ORDER — METOPROLOL SUCCINATE ER 50 MG PO TB24: 50.0000 mg | ORAL_TABLET | Freq: Every day | ORAL | 3 refills | Status: DC

## 2021-09-22 NOTE — Telephone Encounter (Signed)
Daughter called with questions regarding patient's first delivery. Spoke to her and answered. Double checked with [facility] to make sure delivery is coming Friday. They have not gotten scripts yet. Will coordinate with [facility] to get meds ASAP ?

## 2021-09-22 NOTE — Telephone Encounter (Signed)
[facility] synthroid to [facility].  ?Harrell Lark 09/22/21 1:43 PM ? ?

## 2021-09-24 ENCOUNTER — Telehealth: Payer: Self-pay

## 2021-09-24 NOTE — Chronic Care Management (AMB) (Signed)
09-24-2021: Received a message from [place] at [facility] that patient's daughter [name] left a voicemail wanting medications delivered to her business address instead of patient's home but didn't leave address. Attempted to contact daughter and left a voicemail. Informed Ria Comment.  [place] Pharmacist Assistant [phone]

## 2021-09-24 NOTE — Telephone Encounter (Signed)
Coordinated with [facility] to make sure delivery went out today for patient

## 2021-10-12 ENCOUNTER — Telehealth: Payer: Self-pay

## 2021-10-12 NOTE — Chronic Care Management (AMB) (Signed)
    Chronic Care Management Pharmacy Assistant   Name: Jamy Cleckler Sr.  MRN: [id number] DOB: [birth year]   Reason for Encounter: Medication Coordination   Daughter Wellington [place] called, leaving a message that patent is need a refill of his Fosamax. Checked patient's chart, Fosamax was discontinued on 09/01/2021 as completed course. Called daughter back, no answer, left message that of medication discontinuation but will verify with PCP and clinical team to see if medication should be continued.   Medications: Outpatient Encounter Medications as of 10/12/2021  Medication Sig   acetaminophen (TYLENOL) 325 MG tablet Take 2 tablets (650 mg total) by mouth every 6 (six) hours as needed. (Patient taking differently: Take 650 mg by mouth every 6 (six) hours as needed for mild pain or moderate pain.)   albuterol (VENTOLIN HFA) 108 (90 Base) MCG/ACT inhaler Inhale 2 puffs into the lungs every 6 (six) hours as needed for wheezing or shortness of breath.   aspirin 81 MG EC tablet Take 1 tablet (81 mg total) by mouth daily. Swallow whole.   atorvastatin (LIPITOR) 80 MG tablet Take 1 tablet (80 mg total) by mouth daily.   ferrous sulfate (FERROUSUL) 325 (65 FE) MG tablet Take 1 tablet (325 mg total) by mouth daily with breakfast.   ipratropium-albuterol (DUONEB) 0.5-2.5 (3) MG/3ML SOLN Take 3 mLs by nebulization every 4 (four) hours as needed (shortness of breath or wheezing).   levothyroxine (SYNTHROID) 175 MCG tablet Take 1 tablet (175 mcg total) by mouth daily before breakfast.   metoprolol succinate (TOPROL-XL) 50 MG 24 hr tablet Take 1 tablet (50 mg total) by mouth daily. TAKE WITH OR IMMEDIATELY FOLLOWING A MEAL.   Multiple Vitamins-Minerals (MULTIVITAMIN ADULT PO) Take 1 tablet by mouth daily. Unknown strength   Nutritional Supplements (BOOST NUTRITIONAL ENERGY PO) Take 237 mLs by mouth in the morning and at bedtime. Drinking 1-2 per day   triamcinolone (KENALOG) 0.1 % Apply 1 application  topically 2 (two) times daily as needed (rash).   No facility-administered encounter medications on file as of 10/12/2021.   Pattricia Boss, [place] Pharmacist Assistant [phone]

## 2021-10-13 ENCOUNTER — Ambulatory Visit (INDEPENDENT_AMBULATORY_CARE_PROVIDER_SITE_OTHER): Payer: Medicare HMO | Admitting: Legal Medicine

## 2021-10-13 ENCOUNTER — Encounter: Payer: Self-pay | Admitting: Legal Medicine

## 2021-10-13 VITALS — BP 120/70 | HR 72 | Temp 96.7°F | Ht 72.0 in | Wt 135.0 lb

## 2021-10-13 DIAGNOSIS — K921 Melena: Secondary | ICD-10-CM | POA: Insufficient documentation

## 2021-10-13 NOTE — Progress Notes (Signed)
Subjective:  Patient ID: James Iha Sr., male    DOB: 02-28-1932  Age: 88 y.o. MRN: [id number]  Chief Complaint  Patient presents with   Dark stools    HPI: doe for one month.  Using nebulizer more, last hemoglobin 11.2 04/28/21, He had catheterization 06/28/21. No stents. Still waling, no chest pain    Patient states he just doesn't feel good, BM have been somewhat darker than usual but denies any bloody stools or constipation. Walked his puppy this week and felt sob.  No diarrhea or constipation Current Outpatient Medications on File Prior to Visit  Medication Sig Dispense Refill   acetaminophen (TYLENOL) 325 MG tablet Take 2 tablets (650 mg total) by mouth every 6 (six) hours as needed. (Patient taking differently: Take 650 mg by mouth every 6 (six) hours as needed for mild pain or moderate pain.) 30 tablet 1   albuterol (VENTOLIN HFA) 108 (90 Base) MCG/ACT inhaler Inhale 2 puffs into the lungs every 6 (six) hours as needed for wheezing or shortness of breath. 18 each 12   aspirin 81 MG EC tablet Take 1 tablet (81 mg total) by mouth daily. Swallow whole. 90 tablet 3   atorvastatin (LIPITOR) 80 MG tablet Take 1 tablet (80 mg total) by mouth daily. 90 tablet 1   ferrous sulfate (FERROUSUL) 325 (65 FE) MG tablet Take 1 tablet (325 mg total) by mouth daily with breakfast. 30 tablet 12   ipratropium-albuterol (DUONEB) 0.5-2.5 (3) MG/3ML SOLN Take 3 mLs by nebulization every 4 (four) hours as needed (shortness of breath or wheezing). 360 mL 2   levothyroxine (SYNTHROID) 175 MCG tablet Take 1 tablet (175 mcg total) by mouth daily before breakfast. 90 tablet 1   metoprolol succinate (TOPROL-XL) 50 MG 24 hr tablet Take 1 tablet (50 mg total) by mouth daily. TAKE WITH OR IMMEDIATELY FOLLOWING A MEAL. 90 tablet 3   Multiple Vitamins-Minerals (MULTIVITAMIN ADULT PO) Take 1 tablet by mouth daily. Unknown strength     Nutritional Supplements (BOOST NUTRITIONAL ENERGY PO) Take 237 mLs by mouth in  the morning and at bedtime. Drinking 1-2 per day     triamcinolone (KENALOG) 0.1 % Apply 1 application topically 2 (two) times daily as needed (rash).     No current facility-administered medications on file prior to visit.   Past Medical History:  Diagnosis Date   Acquired hypothyroidism 10/10/158   Acute systolic heart failure ([place]) 06/25/2020   Anemia 02/05/2020   boarderline anemic   Atopic dermatitis 12/05/2019   Bacterial pneumonia, unspecified 06/14/2012   06/14/2012 CXR w/ RML PNA >Zithromax and Rocephin     Bradycardia 11/28/2018   CAD (coronary artery disease) 07/07/2020   Cancer ([place])    prostate cancer-seed implant   Closed fracture dislocation of right elbow    Closed fracture of right olecranon process 04/10/2020   COPD (chronic obstructive pulmonary disease) ([place]) 01/21/2020   Dermatitis 02/05/2020   Dizziness 02/19/2016   Essential hypertension 11/28/2018   History of prostate cancer    seed implant   Hyperlipemia    Hyperlipidemia, mixed 08/21/2008   Qualifier: Diagnosis of  By: Annamaria Boots MD, Clinton D    LVH (left ventricular hypertrophy) 11/28/2018   Malnutrition of moderate degree ([place]) 08/15/2019   Mixed conductive and sensorineural hearing loss, 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   Moderate persistent asthma, uncomplicated 05/17/3233   Nonsustained ventricular tachycardia ([place]) 01/30/2019   NSTEMI (non-ST elevated myocardial infarction) ([place]) 06/21/2020   Other fatigue 02/05/2020   PAF (paroxysmal  atrial fibrillation) ([place]) 06/25/2020   Seasonal and perennial allergic rhinitis 05/17/2007       Senile osteoporosis 08/15/2019   SINUSITIS, ACUTE 06/28/2007   Qualifier: Diagnosis of  By: Royal Piedra NP, Tammy     Ventricular ectopy 11/28/2018   Past Surgical History:  Procedure Laterality Date   APPENDECTOMY     CORONARY ATHERECTOMY N/A 06/24/2020   Procedure: CORONARY ATHERECTOMY;  Surgeon: Wellington Hampshire, MD;  Location: [facility];  Service: Cardiovascular;  Laterality: N/A;   INTRAVASCULAR  ULTRASOUND/IVUS N/A 06/24/2020   Procedure: Intravascular Ultrasound/IVUS;  Surgeon: Wellington Hampshire, MD;  Location: [facility];  Service: Cardiovascular;  Laterality: N/A;   LEFT HEART CATH AND CORONARY ANGIOGRAPHY N/A 06/22/2020   Procedure: LEFT HEART CATH AND CORONARY ANGIOGRAPHY;  Surgeon: Nelva Bush, MD;  Location: [facility];  Service: Cardiovascular;  Laterality: N/A;   ORIF ELBOW FRACTURE Right 04/10/2020   Procedure: OPEN REDUCTION INTERNAL FIXATION (ORIF) ELBOW/OLECRANON FRACTURE;  Surgeon: Marchia Bond, MD;  Location: [facility];  Service: Orthopedics;  Laterality: Right;    Family History  Problem Relation Age of Onset   Alzheimer's disease Father    Diabetes Mother    Social History   Socioeconomic History   Marital status: Married    Spouse name: Not on file   Number of children: Not on file   Years of education: Not on file   Highest education level: Not on file  Occupational History   Occupation: Retired    Fish farm manager: OTHER    Comment: Scientist, research (life sciences)  Tobacco Use   Smoking status: Former    Types: Cigarettes    Quit date: 1960    Years since quitting: 63.4   Smokeless tobacco: Former    Types: Chew    Quit date: 2011  Vaping Use   Vaping Use: Never used  Substance and Sexual Activity   Alcohol use: No   Drug use: No   Sexual activity: Not Currently  Other Topics Concern   Not on file  Social History Narrative   Not on file   Social Determinants of Health   Financial Resource Strain: [place]  (08/31/2021)   Overall Financial Resource Strain (CARDIA)    Difficulty of Paying Living Expenses: Not hard at all  Food Insecurity: No Food Insecurity (01/29/2021)   Hunger Vital Sign    Worried About Running Out of Food in the Last Year: Never true    [employer] in the Last Year: Never true  Transportation Needs: No Transportation Needs (08/31/2021)   PRAPARE - Hydrologist  (Medical): No    Lack of Transportation (Non-Medical): No  Physical Activity: Insufficiently Active (01/29/2021)   Exercise Vital Sign    Days of Exercise per Week: 7 days    Minutes of Exercise per Session: 10 min  Stress: No Stress Concern Present (01/29/2021)   [place]    Feeling of Stress : Not at all  Social Connections: [place] (01/29/2021)   Social Connection and Isolation Panel [NHANES]    Frequency of Communication with Friends and Family: Three times a week    Frequency of Social Gatherings with Friends and Family: Three times a week    Attends Religious Services: 1 to 4 times per year    Active Member of Clubs or Organizations: Yes    Attends Archivist Meetings: 1 to 4 times  per year    Marital Status: Married    Review of Systems  Constitutional:  Negative for activity change and appetite change.  HENT:  Negative for congestion and dental problem.   Eyes:  Negative for visual disturbance.  Respiratory:  Positive for cough.   Cardiovascular:  Negative for chest pain.  Endocrine: Negative for polyuria.  Genitourinary:  Negative for difficulty urinating and dysuria.  Musculoskeletal:  Negative for arthralgias and back pain.  Skin:  Positive for pallor.  Allergic/Immunologic: Negative for immunocompromised state.  Neurological:  Negative for dizziness and facial asymmetry.  Psychiatric/Behavioral:  Negative for agitation and behavioral problems.      Objective:  BP 120/70   Pulse 72   Temp (!) 96.7 F (35.9 C)   Ht 6' (1.829 m)   Wt 135 lb (61.2 kg)   SpO2 99%   BMI 18.31 kg/m      10/13/2021    3:47 PM 09/09/2021   10:35 AM 06/28/2021   10:25 AM  BP/Weight  Systolic BP 193 790 240  Diastolic BP 70 70 78  Wt. (Lbs) 135 134.2 132  BMI 18.31 kg/m2 18.2 kg/m2 17.9 kg/m2    Physical Exam Vitals reviewed.  Constitutional:      Appearance: Normal appearance. He is  ill-appearing.  HENT:     Head: Normocephalic.     Right Ear: Tympanic membrane normal.     Left Ear: Tympanic membrane normal.     Mouth/Throat:     Mouth: Mucous membranes are moist.  Eyes:     Extraocular Movements: Extraocular movements intact.     Conjunctiva/sclera: Conjunctivae normal.     Pupils: Pupils are equal, round, and reactive to light.     Comments: Pale conjuctiva  Cardiovascular:     Rate and Rhythm: Normal rate and regular rhythm.     Pulses: Normal pulses.     Heart sounds: Normal heart sounds. No murmur heard.    No gallop.  Pulmonary:     Effort: Pulmonary effort is normal. No respiratory distress.     Breath sounds: Normal breath sounds. No wheezing.  Abdominal:     General: Abdomen is flat. Bowel sounds are normal. There is no distension.     Palpations: Abdomen is soft.     Tenderness: There is no abdominal tenderness.  Genitourinary:    Rectum: Guaiac result positive.  Musculoskeletal:        General: Normal range of motion.     Cervical back: Normal range of motion and neck supple.  Skin:    Capillary Refill: Capillary refill takes less than 2 seconds.  Neurological:     General: No focal deficit present.     Mental Status: He is alert. Mental status is at baseline.         Lab Results  Component Value Date   WBC 8.9 10/13/2021   HGB 11.0 (L) 10/13/2021   HCT 33.5 (L) 10/13/2021   PLT 225 10/13/2021   GLUCOSE 93 10/13/2021   CHOL 117 04/28/2021   TRIG 66 04/28/2021   HDL 47 04/28/2021   LDLCALC 56 04/28/2021   ALT 17 10/13/2021   AST 21 10/13/2021   NA 137 10/13/2021   K 4.8 10/13/2021   CL 102 10/13/2021   CREATININE 0.76 10/13/2021   BUN 18 10/13/2021   CO2 23 10/13/2021   TSH 1.710 04/28/2021   HGBA1C 6.3 (H) 06/22/2020      Assessment & Plan:   Problem List Items Addressed This  Visit       Digestive   Melena - Primary   Relevant Orders   CBC with Differential/Platelet (Completed)   Comprehensive metabolic panel  (Completed) Patient has Hemoccult positive stool we will get a CBC to see how he is doing we discussed we may have to perform a type and crossmatch and possible transfusion but we will need the the labs to confirm this.  He has no evidence for congestive failure we did encourage him to continue fluids.  CBC does not show any drop in hemoglobin hematocrit we will continue to watch him at this time  .    Orders Placed This Encounter  Procedures   CBC with Differential/Platelet   Comprehensive metabolic panel     Follow-up: Return in about 1 week (around 10/20/2021).  An After Visit Summary was printed and given to the patient.  Reinaldo Meeker, MD [facility] [phone]

## 2021-10-14 ENCOUNTER — Telehealth: Payer: Self-pay

## 2021-10-14 LAB — CBC WITH DIFFERENTIAL/PLATELET
Basophils Absolute: 0.1 10*3/uL (ref 0.0–0.2)
Basos: 1 %
EOS (ABSOLUTE): 0.5 10*3/uL — ABNORMAL HIGH (ref 0.0–0.4)
Eos: 6 %
Hematocrit: 33.5 % — ABNORMAL LOW (ref 37.5–51.0)
Hemoglobin: 11 g/dL — ABNORMAL LOW (ref 13.0–17.7)
Immature Grans (Abs): 0 10*3/uL (ref 0.0–0.1)
Immature Granulocytes: 0 %
Lymphocytes Absolute: 1.9 10*3/uL (ref 0.7–3.1)
Lymphs: 21 %
MCH: 30.6 pg (ref 26.6–33.0)
MCHC: 32.8 g/dL (ref 31.5–35.7)
MCV: 93 fL (ref 79–97)
Monocytes Absolute: 1.1 10*3/uL — ABNORMAL HIGH (ref 0.1–0.9)
Monocytes: 13 %
Neutrophils Absolute: 5.2 10*3/uL (ref 1.4–7.0)
Neutrophils: 59 %
Platelets: 225 10*3/uL (ref 150–450)
RBC: 3.59 x10E6/uL — ABNORMAL LOW (ref 4.14–5.80)
RDW: 12.6 % (ref 11.6–15.4)
WBC: 8.9 10*3/uL (ref 3.4–10.8)

## 2021-10-14 LAB — COMPREHENSIVE METABOLIC PANEL
ALT: 17 IU/L (ref 0–44)
AST: 21 IU/L (ref 0–40)
Albumin/Globulin Ratio: 2.1 (ref 1.2–2.2)
Albumin: 4.4 g/dL (ref 3.6–4.6)
Alkaline Phosphatase: 51 IU/L (ref 44–121)
BUN/Creatinine Ratio: 24 (ref 10–24)
BUN: 18 mg/dL (ref 8–27)
Bilirubin Total: <0.2 mg/dL (ref 0.0–1.2)
CO2: 23 mmol/L (ref 20–29)
Calcium: 9.1 mg/dL (ref 8.6–10.2)
Chloride: 102 mmol/L (ref 96–106)
Creatinine, Ser: 0.76 mg/dL (ref 0.76–1.27)
Globulin, Total: 2.1 g/dL (ref 1.5–4.5)
Glucose: 93 mg/dL (ref 70–99)
Potassium: 4.8 mmol/L (ref 3.5–5.2)
Sodium: 137 mmol/L (ref 134–144)
Total Protein: 6.5 g/dL (ref 6.0–8.5)
eGFR: 86 mL/min/{1.73_m2} (ref >59)

## 2021-10-14 NOTE — Chronic Care Management (AMB) (Signed)
    Chronic Care Management Pharmacy Assistant   Name: Sabien Umland Sr.  MRN: [id number] DOB: [birth year]  Reason for Encounter: Medication Follow up   Called patient daughter to follow up on Fosamax and explained that per Dr Blanch Media advisement he no longer needs Fosamax. Daughter aware and mentioned she did talk to Dr Henrene Pastor about this yesterday at acute visit with her father. She appreciated the call and was waiting to hear from Dr [facility] office regarding patient labs, patient was seen for blood in stools and wasn't feeling good at all yesterday. Informed daughter that labs were back and will send an Urgent message to the clinical team and Dr Tobie Poet (Dr Henrene Pastor out of office today) to review labs and contact her with instructions.   Daughter called back and has not heard from PCP office, checked notes and labs were released and Dr Henrene Pastor reviewed. Called Daughter back to inform, no answer, left message that she should be able to view labs results with Dr Blanch Media recommendations from [facility].   Medications: Outpatient Encounter Medications as of 10/14/2021  Medication Sig   acetaminophen (TYLENOL) 325 MG tablet Take 2 tablets (650 mg total) by mouth every 6 (six) hours as needed. (Patient taking differently: Take 650 mg by mouth every 6 (six) hours as needed for mild pain or moderate pain.)   albuterol (VENTOLIN HFA) 108 (90 Base) MCG/ACT inhaler Inhale 2 puffs into the lungs every 6 (six) hours as needed for wheezing or shortness of breath.   aspirin 81 MG EC tablet Take 1 tablet (81 mg total) by mouth daily. Swallow whole.   atorvastatin (LIPITOR) 80 MG tablet Take 1 tablet (80 mg total) by mouth daily.   ferrous sulfate (FERROUSUL) 325 (65 FE) MG tablet Take 1 tablet (325 mg total) by mouth daily with breakfast.   ipratropium-albuterol (DUONEB) 0.5-2.5 (3) MG/3ML SOLN Take 3 mLs by nebulization every 4 (four) hours as needed (shortness of breath or wheezing).   levothyroxine (SYNTHROID) 175 MCG  tablet Take 1 tablet (175 mcg total) by mouth daily before breakfast.   metoprolol succinate (TOPROL-XL) 50 MG 24 hr tablet Take 1 tablet (50 mg total) by mouth daily. TAKE WITH OR IMMEDIATELY FOLLOWING A MEAL.   Multiple Vitamins-Minerals (MULTIVITAMIN ADULT PO) Take 1 tablet by mouth daily. Unknown strength   Nutritional Supplements (BOOST NUTRITIONAL ENERGY PO) Take 237 mLs by mouth in the morning and at bedtime. Drinking 1-2 per day   triamcinolone (KENALOG) 0.1 % Apply 1 application topically 2 (two) times daily as needed (rash).   No facility-administered encounter medications on file as of 10/14/2021.   Pattricia Boss, [place] Pharmacist Assistant [phone]

## 2021-10-14 NOTE — Progress Notes (Signed)
Hemoglobin is still 11, no changes despite hemocult positive stool, Kidney and liver tests normal, no need to transfuse, give 3 hemocults, maintain hydration for now. lp

## 2021-10-18 ENCOUNTER — Telehealth: Payer: Self-pay

## 2021-10-18 NOTE — Progress Notes (Signed)
    Chronic Care Management Pharmacy Assistant   Name: Ishan Sanroman Sr.  MRN: [id number] DOB: 05/26/1931   Reason for Encounter: Medication Coordination for Upstream    Recent office visits:  10/13/21 Reinaldo Meeker MD. Seen for Melena. No med changes.  09/01/21 Reinaldo Meeker MD. Orders Only. D/C Alendronate '70mg'$ .   Recent consult visits:  09/09/21 (Pulmonology) Baird Lyons MD. Seen for Asthma. D/C Plavix '75mg'$ .   Hospital visits:  None  Medications: Outpatient Encounter Medications as of 10/18/2021  Medication Sig   acetaminophen (TYLENOL) 325 MG tablet Take 2 tablets (650 mg total) by mouth every 6 (six) hours as needed. (Patient taking differently: Take 650 mg by mouth every 6 (six) hours as needed for mild pain or moderate pain.)   albuterol (VENTOLIN HFA) 108 (90 Base) MCG/ACT inhaler Inhale 2 puffs into the lungs every 6 (six) hours as needed for wheezing or shortness of breath.   aspirin 81 MG EC tablet Take 1 tablet (81 mg total) by mouth daily. Swallow whole.   atorvastatin (LIPITOR) 80 MG tablet Take 1 tablet (80 mg total) by mouth daily.   ferrous sulfate (FERROUSUL) 325 (65 FE) MG tablet Take 1 tablet (325 mg total) by mouth daily with breakfast.   ipratropium-albuterol (DUONEB) 0.5-2.5 (3) MG/3ML SOLN Take 3 mLs by nebulization every 4 (four) hours as needed (shortness of breath or wheezing).   levothyroxine (SYNTHROID) 175 MCG tablet Take 1 tablet (175 mcg total) by mouth daily before breakfast.   metoprolol succinate (TOPROL-XL) 50 MG 24 hr tablet Take 1 tablet (50 mg total) by mouth daily. TAKE WITH OR IMMEDIATELY FOLLOWING A MEAL.   Multiple Vitamins-Minerals (MULTIVITAMIN ADULT PO) Take 1 tablet by mouth daily. Unknown strength   Nutritional Supplements (BOOST NUTRITIONAL ENERGY PO) Take 237 mLs by mouth in the morning and at bedtime. Drinking 1-2 per day   triamcinolone (KENALOG) 0.1 % Apply 1 application topically 2 (two) times daily as needed (rash).   No  facility-administered encounter medications on file as of 10/18/2021.    Reviewed chart for medication changes ahead of medication coordination call.  No hospital visits since last care coordination call/Pharmacist visit.   BP Readings from Last 3 Encounters:  10/13/21 120/70  09/09/21 130/70  06/28/21 112/78    Lab Results  Component Value Date   HGBA1C 6.3 (H) 06/22/2020     Patient obtains medications through Adherence Packaging  30 Days   Patient is due for first adherence delivery on: 10/28/21. Called patient and reviewed medications and coordinated delivery.  This delivery to include: Levothyroxine 181mg 1 BB Atorvastatin '80mg'$  1 B Aspirin '81mg'$  1 B Metoprolol Succ ER '50mg'$  1 B Ferrous Sulfate '325mg'$  1 B Vitamin B12 1007m 1 B Fexofenadine '180mg'$  1 B Albuterol Inhaler 2 puffs every 6 hours as needed   Patient declined the following medications  None  Patient needs refills  None  Confirmed delivery date of 10/28/21, advised patient that pharmacy will contact them the morning of delivery.  DaElray McgregorCMClevelandharmacist Assistant  [phone]

## 2021-10-21 ENCOUNTER — Telehealth: Payer: Self-pay

## 2021-11-16 ENCOUNTER — Telehealth: Payer: Self-pay

## 2021-11-16 NOTE — Progress Notes (Signed)
    Chronic Care Management Pharmacy Assistant   Name: James Vickrey Sr.  MRN: [id number] DOB: 30-Jun-1931   Reason for Encounter: Medication Coordination for Upstream    Recent office visits:  None  Recent consult visits:  None  Hospital visits:  None  Medications: Outpatient Encounter Medications as of 11/16/2021  Medication Sig   acetaminophen (TYLENOL) 325 MG tablet Take 2 tablets (650 mg total) by mouth every 6 (six) hours as needed. (Patient taking differently: Take 650 mg by mouth every 6 (six) hours as needed for mild pain or moderate pain.)   albuterol (VENTOLIN HFA) 108 (90 Base) MCG/ACT inhaler Inhale 2 puffs into the lungs every 6 (six) hours as needed for wheezing or shortness of breath.   aspirin 81 MG EC tablet Take 1 tablet (81 mg total) by mouth daily. Swallow whole.   atorvastatin (LIPITOR) 80 MG tablet Take 1 tablet (80 mg total) by mouth daily.   ferrous sulfate (FERROUSUL) 325 (65 FE) MG tablet Take 1 tablet (325 mg total) by mouth daily with breakfast.   ipratropium-albuterol (DUONEB) 0.5-2.5 (3) MG/3ML SOLN Take 3 mLs by nebulization every 4 (four) hours as needed (shortness of breath or wheezing).   levothyroxine (SYNTHROID) 175 MCG tablet Take 1 tablet (175 mcg total) by mouth daily before breakfast.   metoprolol succinate (TOPROL-XL) 50 MG 24 hr tablet Take 1 tablet (50 mg total) by mouth daily. TAKE WITH OR IMMEDIATELY FOLLOWING A MEAL.   Multiple Vitamins-Minerals (MULTIVITAMIN ADULT PO) Take 1 tablet by mouth daily. Unknown strength   Nutritional Supplements (BOOST NUTRITIONAL ENERGY PO) Take 237 mLs by mouth in the morning and at bedtime. Drinking 1-2 per day   triamcinolone (KENALOG) 0.1 % Apply 1 application topically 2 (two) times daily as needed (rash).   No facility-administered encounter medications on file as of 11/16/2021.    Reviewed chart for medication changes ahead of medication coordination call.  No OVs, Consults, or hospital visits  since last care coordination call/Pharmacist visit.   No medication changes indicated OR if recent visit, treatment plan here.  BP Readings from Last 3 Encounters:  10/13/21 120/70  09/09/21 130/70  06/28/21 112/78    Lab Results  Component Value Date   HGBA1C 6.3 (H) 06/22/2020     Patient obtains medications through Adherence Packaging  30 Days   Last adherence delivery included:  Levothyroxine 159mg 1 BB Atorvastatin '80mg'$  1 B Aspirin '81mg'$  1 B Metoprolol Succ ER '50mg'$  1 B Ferrous Sulfate '325mg'$  1 B Vitamin B12 10051m 1 B Fexofenadine '180mg'$  1 B Albuterol Inhaler 2 puffs every 6 hours as needed      Patient declined (meds) last month  None  Patient is due for next adherence delivery on: 11/26/21. Called patient and reviewed medications and coordinated delivery.  This delivery to include: Levothyroxine 17582m1 BB Atorvastatin '80mg'$  1 B Aspirin '81mg'$  1 B Metoprolol Succ ER '50mg'$  1 B Ferrous Sulfate '325mg'$  1 B Vitamin B12 1000m87m B Fexofenadine '180mg'$  1 B Albuterol Inhaler 2 puffs every 6 hours as needed      Patient declined the following medications  None  Patient needs refills  None  Confirmed delivery date of 11/26/21, advised patient that pharmacy will contact them the morning of delivery.  DaniElray McgregorA [place]acist Assistant  [phone]

## 2021-11-24 ENCOUNTER — Other Ambulatory Visit: Payer: Self-pay

## 2021-12-15 ENCOUNTER — Telehealth: Payer: Self-pay

## 2021-12-15 NOTE — Progress Notes (Signed)
    Chronic Care Management Pharmacy Assistant   Name: Syncere Eble Sr.  MRN: [id number] DOB: [birth year]   Reason for Encounter: Medication Coordination for Upstream    Recent office visits:  None  Recent consult visits:  None  Hospital visits:  None  Medications: Outpatient Encounter Medications as of 12/15/2021  Medication Sig   acetaminophen (TYLENOL) 325 MG tablet Take 2 tablets (650 mg total) by mouth every 6 (six) hours as needed. (Patient taking differently: Take 650 mg by mouth every 6 (six) hours as needed for mild pain or moderate pain.)   albuterol (VENTOLIN HFA) 108 (90 Base) MCG/ACT inhaler Inhale 2 puffs into the lungs every 6 (six) hours as needed for wheezing or shortness of breath.   aspirin 81 MG EC tablet Take 1 tablet (81 mg total) by mouth daily. Swallow whole.   atorvastatin (LIPITOR) 80 MG tablet Take 1 tablet (80 mg total) by mouth daily.   ferrous sulfate (FERROUSUL) 325 (65 FE) MG tablet Take 1 tablet (325 mg total) by mouth daily with breakfast.   ipratropium-albuterol (DUONEB) 0.5-2.5 (3) MG/3ML SOLN Take 3 mLs by nebulization every 4 (four) hours as needed (shortness of breath or wheezing).   levothyroxine (SYNTHROID) 175 MCG tablet Take 1 tablet (175 mcg total) by mouth daily before breakfast.   metoprolol succinate (TOPROL-XL) 50 MG 24 hr tablet Take 1 tablet (50 mg total) by mouth daily. TAKE WITH OR IMMEDIATELY FOLLOWING A MEAL.   Multiple Vitamins-Minerals (MULTIVITAMIN ADULT PO) Take 1 tablet by mouth daily. Unknown strength   Nutritional Supplements (BOOST NUTRITIONAL ENERGY PO) Take 237 mLs by mouth in the morning and at bedtime. Drinking 1-2 per day   triamcinolone (KENALOG) 0.1 % Apply 1 application topically 2 (two) times daily as needed (rash).   No facility-administered encounter medications on file as of 12/15/2021.    Reviewed chart for medication changes ahead of medication coordination call.  No OVs, Consults, or hospital visits since  last care coordination call/Pharmacist visit.   No medication changes indicated OR if recent visit, treatment plan here.  BP Readings from Last 3 Encounters:  10/13/21 120/70  09/09/21 130/70  06/28/21 112/78    Lab Results  Component Value Date   HGBA1C 6.3 (H) 06/22/2020     Patient obtains medications through Adherence Packaging  30 Days   Last adherence delivery included:  Levothyroxine 135mg 1 BB Atorvastatin '80mg'$  1 B Aspirin '81mg'$  1 B Metoprolol Succ ER '50mg'$  1 B Ferrous Sulfate '325mg'$  1 B Vitamin B12 1001m 1 B Fexofenadine '180mg'$  1 B Albuterol Inhaler 2 puffs every 6 hours as needed      Patient declined (meds) last month  None  Patient is due for next adherence delivery on: 12/27/21. Called patient and reviewed medications and coordinated delivery.  This delivery to include: Levothyroxine 17562m1 BB Atorvastatin '80mg'$  1 B Aspirin '81mg'$  1 B Metoprolol Succ ER '50mg'$  1 B Ferrous Sulfate '325mg'$  1 B Vitamin B12 1000m21m B Fexofenadine '180mg'$  1 B  Patient declined the following medications  Albuterol Inhaler- Pt has enough supply  Ipratropium-Albuterol Nebulizer- Pt was able to get at [facility]  Patient needs refills  None  Confirmed delivery date of 12/27/21, advised patient that pharmacy will contact them the morning of delivery.   DaniElray McgregorA [place]ist Assistant  [phone]

## 2021-12-20 IMAGING — CR DG CHEST 2V
2 series · 2 of 2 positions shown · non-contrast
Comparison: July 14, 2017

CLINICAL DATA: Chest pain

EXAM:
CHEST - 2 VIEW

[chest pa]
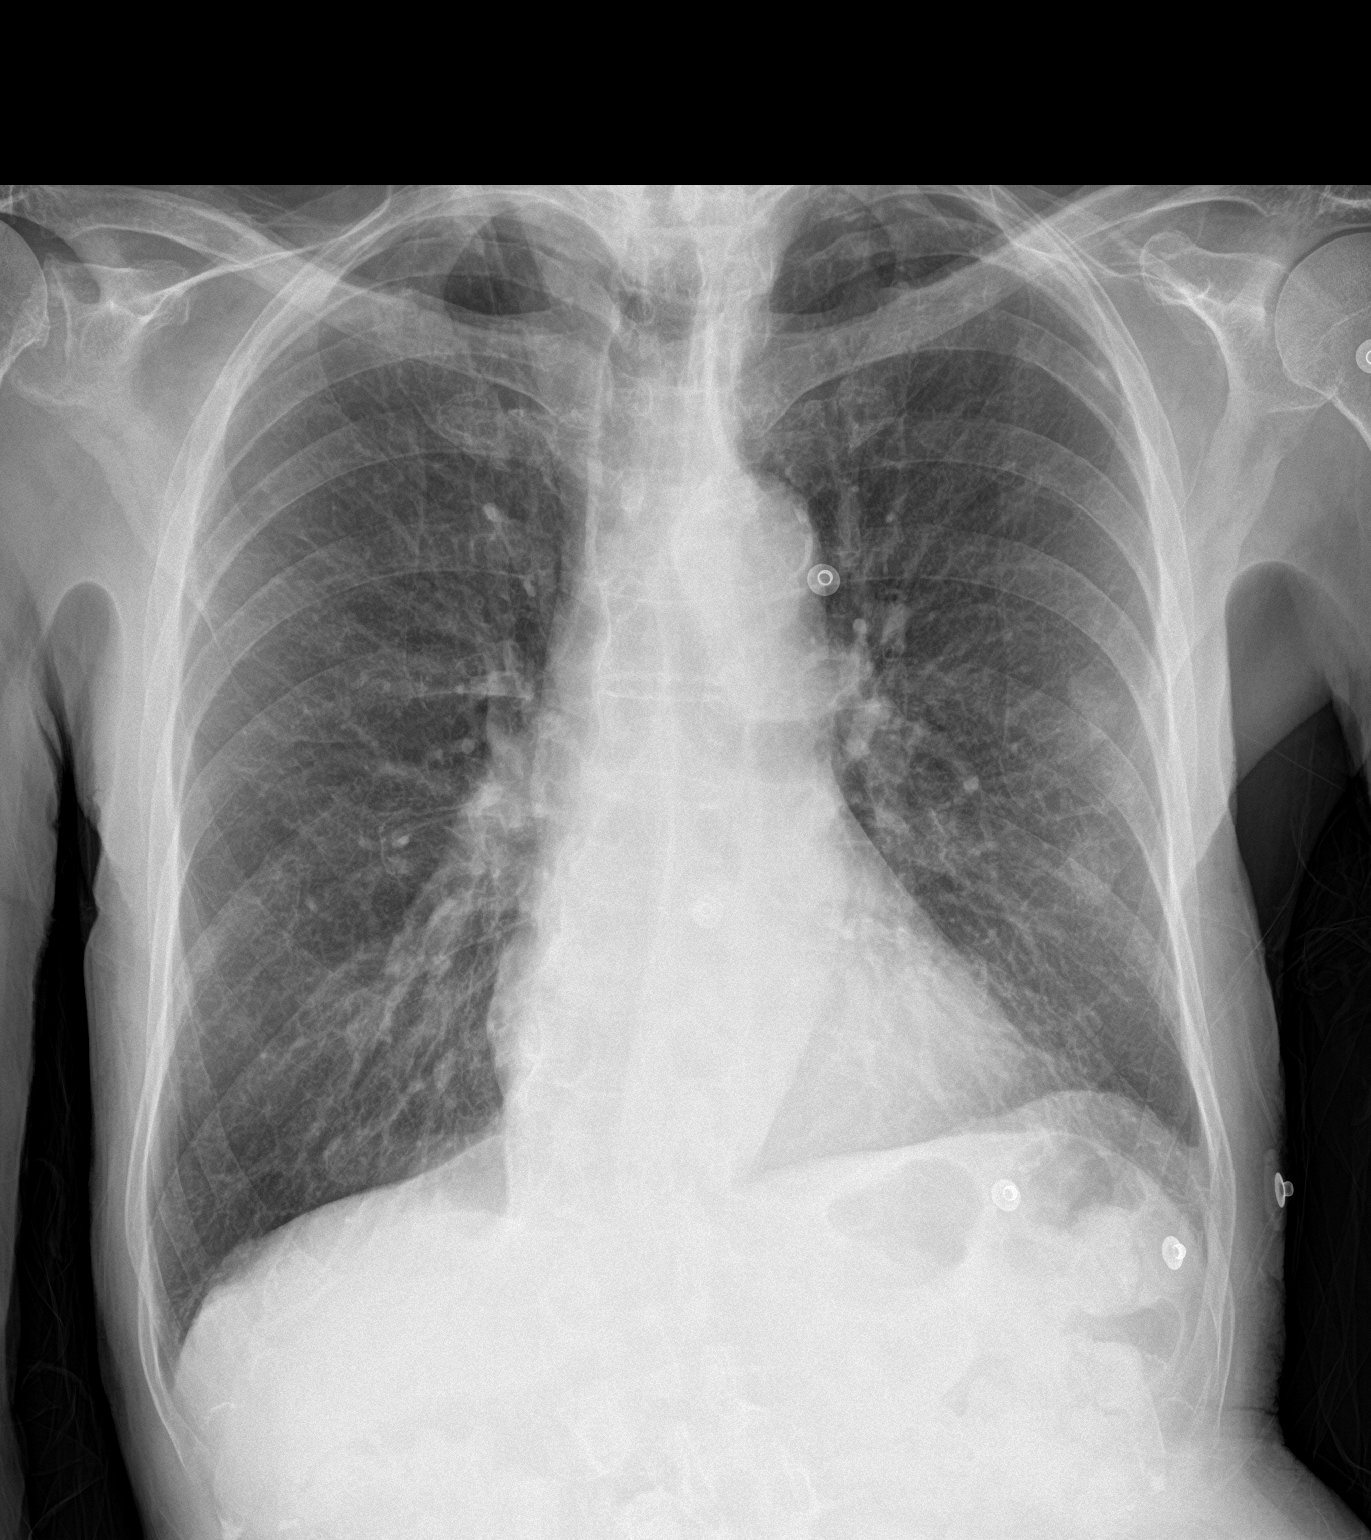

[chest lat]
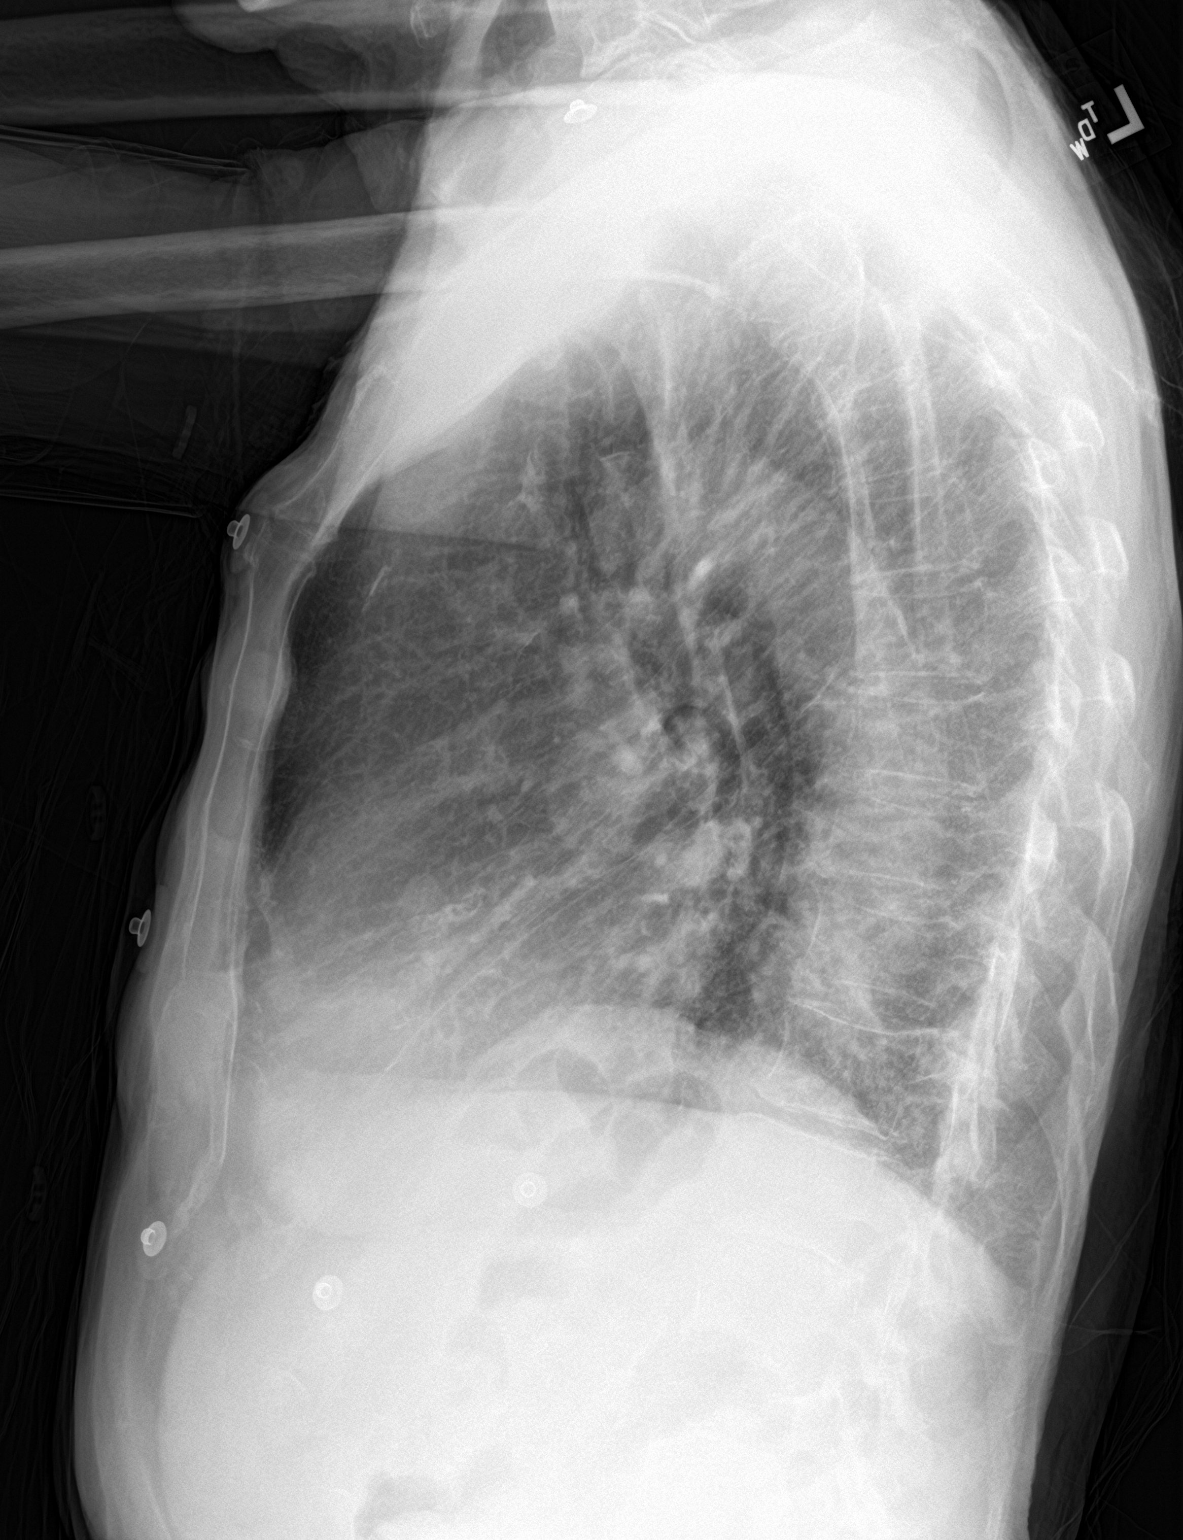

[2 of 2 positions shown; findings below may reference images not displayed]

FINDINGS: The heart size and mediastinal contours are within normal limits.
Lungs are hyperexpanded both lungs are clear. The visualized
skeletal structures are unremarkable. Aortic calcifications are
noted.
IMPRESSION: No active cardiopulmonary disease.

## 2021-12-27 ENCOUNTER — Encounter: Payer: Self-pay | Admitting: Cardiology

## 2021-12-27 ENCOUNTER — Ambulatory Visit (INDEPENDENT_AMBULATORY_CARE_PROVIDER_SITE_OTHER): Payer: Medicare HMO

## 2021-12-27 ENCOUNTER — Ambulatory Visit: Payer: Medicare HMO | Admitting: Cardiology

## 2021-12-27 VITALS — BP 120/70 | HR 46 | Ht 72.0 in | Wt 131.2 lb

## 2021-12-27 DIAGNOSIS — E782 Mixed hyperlipidemia: Secondary | ICD-10-CM

## 2021-12-27 DIAGNOSIS — I2510 Atherosclerotic heart disease of native coronary artery without angina pectoris: Secondary | ICD-10-CM

## 2021-12-27 DIAGNOSIS — I480 Paroxysmal atrial fibrillation: Secondary | ICD-10-CM

## 2021-12-27 DIAGNOSIS — I4729 Other ventricular tachycardia: Secondary | ICD-10-CM | POA: Diagnosis not present

## 2021-12-27 NOTE — Addendum Note (Signed)
Addended by: Jacobo Forest D on: 12/27/2021 11:39 AM   Modules accepted: Orders

## 2021-12-27 NOTE — Patient Instructions (Signed)
Medication Instructions:  Your physician recommends that you continue on your current medications as directed. Please refer to the Current Medication list given to you today.  *If you need a refill on your cardiac medications before your next appointment, please call your pharmacy*   Lab Work: None Ordered If you have labs (blood work) drawn today and your tests are completely normal, you will receive your results only by: [place] (if you have MyChart) OR A paper copy in the mail If you have any lab test that is abnormal or we need to change your treatment, we will call you to review the results.   Testing/Procedures:  WHY IS MY DOCTOR PRESCRIBING ZIO? The Zio system is proven and trusted by physicians to detect and diagnose irregular heart rhythms -- and has been prescribed to hundreds of thousands of patients.  The FDA has cleared the Zio system to monitor for many different kinds of irregular heart rhythms. In a study, physicians were able to reach a diagnosis 90% of the time with the Zio system1.  You can wear the Zio monitor -- a small, discreet, comfortable patch -- during your normal day-to-day activity, including while you sleep, shower, and exercise, while it records every single heartbeat for analysis.  1Barrett, P., et al. Comparison of 24 Hour Holter Monitoring Versus 14 Day Novel Adhesive Patch Electrocardiographic Monitoring. [place], 2014.  ZIO VS. HOLTER MONITORING The Zio monitor can be comfortably worn for up to 14 days. Holter monitors can be worn for 24 to 48 hours, limiting the time to record any irregular heart rhythms you may have. Zio is able to capture data for the 51% of patients who have their first symptom-triggered arrhythmia after 48 hours.1  LIVE WITHOUT RESTRICTIONS The Zio ambulatory cardiac monitor is a small, unobtrusive, and water-resistant patch--you might even forget you're wearing it. The Zio monitor records and stores  every beat of your heart, whether you're sleeping, working out, or showering.     Follow-Up: At [facility], you and your health needs are our priority.  As part of our continuing mission to provide you with exceptional heart care, we have created designated Provider Care Teams.  These Care Teams include your primary Cardiologist (physician) and Advanced Practice Providers (APPs -  Physician Assistants and Nurse Practitioners) who all work together to provide you with the care you need, when you need it.  We recommend signing up for the patient portal called "MyChart".  Sign up information is provided on this After Visit Summary.  MyChart is used to connect with patients for Virtual Visits (Telemedicine).  Patients are able to view lab/test results, encounter notes, upcoming appointments, etc.  Non-urgent messages can be sent to your provider as well.   To learn more about what you can do with MyChart, go to [email or web address].    Your next appointment:   6 month(s)  The format for your next appointment:   In Person  Provider:   [name], MD    Other Instructions:

## 2021-12-27 NOTE — Progress Notes (Signed)
Cardiology Office Note:    Date:  12/27/2021   ID:  James Iha Sr., DOB 1931/12/09, MRN [id number]  PCP:  [name], MD  Cardiologist:  [name], MD    Referring MD: [name],*   Chief Complaint  Patient presents with   Dizziness    History of Present Illness:    James Mey Sr. is a 88 y.o. male   who was initially referred to [place] because of sinus bradycardia, nonsustained ventricular tachycardia.  Work-up however has been negative that included stress test done more than a year ago.  Recently he ended up having some chest pain he went to [facility] he was find to be in atrial fibrillation with fast ventricular rate he also got some spell of troponin.  After that cardiac catheterization has been done which showed critical more than 90% lesion in the mid LAD.  Eventually he did have staged procedures to open up the artery he required Rotablator and of this lesion and stenting.  He comes today to my office for follow-up after that.  He was also found to be in atrial fibrillation he was put on Cardizem and converted spontaneously, he is not anticoagulated because of to significant fall that he sustained  He is coming today to my office for follow-up.  He denies have any chest pain tightness squeezing pressure burning chest but he does complain of having some dizziness especially in the afternoon when he tried to goes outside.  He did not fall down because of this but is concerned about his symptomatology.  Like always he comes to my office with his son.  Denies have any TIA/CVA-like symptoms  Past Medical History:  Diagnosis Date   Acquired hypothyroidism 10/08/6071   Acute systolic heart failure ([place]) 06/25/2020   Anemia 02/05/2020   boarderline anemic   Atopic dermatitis 12/05/2019   Bacterial pneumonia, unspecified 06/14/2012   06/14/2012 CXR w/ RML PNA >Zithromax and Rocephin     Bradycardia 11/28/2018   CAD (coronary artery disease) 07/07/2020    Cancer ([place])    prostate cancer-seed implant   Closed fracture dislocation of right elbow    Closed fracture of right olecranon process 04/10/2020   COPD (chronic obstructive pulmonary disease) ([place]) 01/21/2020   Dermatitis 02/05/2020   Dizziness 02/19/2016   Essential hypertension 11/28/2018   History of prostate cancer    seed implant   Hyperlipemia    Hyperlipidemia, mixed 08/21/2008   Qualifier: Diagnosis of  By: Annamaria Boots MD, [place] D    LVH (left ventricular hypertrophy) 11/28/2018   Malnutrition of moderate degree ([place]) 08/15/2019   Mixed conductive and sensorineural hearing loss, 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   Moderate persistent asthma, uncomplicated 11/16/6267   Nonsustained ventricular tachycardia ([place]) 01/30/2019   NSTEMI (non-ST elevated myocardial infarction) ([place]) 06/21/2020   Other fatigue 02/05/2020   PAF (paroxysmal atrial fibrillation) ([place]) 06/25/2020   Seasonal and perennial allergic rhinitis 05/17/2007       Senile osteoporosis 08/15/2019   SINUSITIS, ACUTE 06/28/2007   Qualifier: Diagnosis of  By: Royal Piedra NP, Tammy     Ventricular ectopy 11/28/2018    Past Surgical History:  Procedure Laterality Date   APPENDECTOMY     CORONARY ATHERECTOMY N/A 06/24/2020   Procedure: CORONARY ATHERECTOMY;  Surgeon: Wellington Hampshire, MD;  Location: [place] [facility];  Service: Cardiovascular;  Laterality: N/A;   INTRAVASCULAR ULTRASOUND/IVUS N/A 06/24/2020   Procedure: Intravascular Ultrasound/IVUS;  Surgeon: Wellington Hampshire, MD;  Location: [place] IN[facility]  LAB;  Service: Cardiovascular;  Laterality: N/A;   LEFT HEART CATH AND CORONARY ANGIOGRAPHY N/A 06/22/2020   Procedure: LEFT HEART CATH AND CORONARY ANGIOGRAPHY;  Surgeon: Nelva Bush, MD;  Location: [place] [facility];  Service: Cardiovascular;  Laterality: N/A;   ORIF ELBOW FRACTURE Right 04/10/2020   Procedure: OPEN REDUCTION INTERNAL FIXATION (ORIF) ELBOW/OLECRANON FRACTURE;  Surgeon: Marchia Bond, MD;  Location: [place] [facility];   Service: Orthopedics;  Laterality: Right;    Current Medications: Current Meds  Medication Sig   acetaminophen (TYLENOL) 325 MG tablet Take 2 tablets (650 mg total) by mouth every 6 (six) hours as needed. (Patient taking differently: Take 650 mg by mouth every 6 (six) hours as needed for mild pain or moderate pain.)   albuterol (VENTOLIN HFA) 108 (90 Base) MCG/ACT inhaler Inhale 2 puffs into the lungs every 6 (six) hours as needed for wheezing or shortness of breath.   aspirin 81 MG EC tablet Take 1 tablet (81 mg total) by mouth daily. Swallow whole.   atorvastatin (LIPITOR) 80 MG tablet Take 1 tablet (80 mg total) by mouth daily.   ferrous sulfate (FERROUSUL) 325 (65 FE) MG tablet Take 1 tablet (325 mg total) by mouth daily with breakfast.   ipratropium-albuterol (DUONEB) 0.5-2.5 (3) MG/3ML SOLN Take 3 mLs by nebulization every 4 (four) hours as needed (shortness of breath or wheezing).   levothyroxine (SYNTHROID) 175 MCG tablet Take 1 tablet (175 mcg total) by mouth daily before breakfast.   metoprolol succinate (TOPROL-XL) 50 MG 24 hr tablet Take 1 tablet (50 mg total) by mouth daily. TAKE WITH OR IMMEDIATELY FOLLOWING A MEAL.   Multiple Vitamins-Minerals (MULTIVITAMIN ADULT PO) Take 1 tablet by mouth daily. Unknown strength   Nutritional Supplements (BOOST NUTRITIONAL ENERGY PO) Take 237 mLs by mouth in the morning and at bedtime. Drinking 1-2 per day   triamcinolone (KENALOG) 0.1 % Apply 1 application topically 2 (two) times daily as needed (rash).     Allergies:   Clarithromycin   Social History   Socioeconomic History   Marital status: Married    Spouse name: Not on file   Number of children: Not on file   Years of education: Not on file   Highest education level: Not on file  Occupational History   Occupation: Retired    Fish farm manager: OTHER    Comment: Scientist, research (life sciences)  Tobacco Use   Smoking status: Former    Types: Cigarettes    Quit date: 1960    Years since quitting:  63.6   Smokeless tobacco: Former    Types: Chew    Quit date: 2011  Vaping Use   Vaping Use: Never used  Substance and Sexual Activity   Alcohol use: No   Drug use: No   Sexual activity: Not Currently  Other Topics Concern   Not on file  Social History Narrative   Not on file   Social Determinants of Health   Financial Resource Strain: [place]  (08/31/2021)   Overall Financial Resource Strain (CARDIA)    Difficulty of Paying Living Expenses: Not hard at all  Food Insecurity: No Food Insecurity (01/29/2021)   Hunger Vital Sign    Worried About Running Out of Food in the Last Year: Never true    [place] in the Last Year: Never true  Transportation Needs: No Transportation Needs (08/31/2021)   PRAPARE - Hydrologist (Medical): No    Lack of Transportation (Non-Medical): No  Physical Activity: Insufficiently Active (01/29/2021)   Exercise Vital Sign    Days of Exercise per Week: 7 days    Minutes of Exercise per Session: 10 min  Stress: No Stress Concern Present (01/29/2021)   [place]    Feeling of Stress : Not at all  Social Connections: [place] (01/29/2021)   Social Connection and Isolation Panel [NHANES]    Frequency of Communication with Friends and Family: Three times a week    Frequency of Social Gatherings with Friends and Family: Three times a week    Attends Religious Services: 1 to 4 times per year    Active Member of Clubs or Organizations: Yes    Attends Archivist Meetings: 1 to 4 times per year    Marital Status: Married     Family History: The patient's family history includes Alzheimer's disease in his father; Diabetes in his mother. ROS:   Please see the history of present illness.    All 14 point review of systems negative except as described per history of present illness  EKGs/Labs/Other Studies Reviewed:      Recent  Labs: 04/28/2021: TSH 1.710 10/13/2021: ALT 17; BUN 18; Creatinine, Ser 0.76; Hemoglobin 11.0; Platelets 225; Potassium 4.8; Sodium 137  Recent Lipid Panel    Component Value Date/Time   CHOL 117 04/28/2021 1147   TRIG 66 04/28/2021 1147   HDL 47 04/28/2021 1147   CHOLHDL 2.5 04/28/2021 1147   LDLCALC 56 04/28/2021 1147    Physical Exam:    VS:  BP 120/70 (BP Location: Left Arm, Patient Position: Sitting)   Pulse (!) 46   Ht 6' (1.829 m)   Wt 131 lb 3.2 oz (59.5 kg)   SpO2 96%   BMI 17.79 kg/m     Wt Readings from Last 3 Encounters:  12/27/21 131 lb 3.2 oz (59.5 kg)  10/13/21 135 lb (61.2 kg)  09/09/21 134 lb 3.2 oz (60.9 kg)     GEN:  Well nourished, well developed in no acute distress HEENT: Normal NECK: No JVD; No carotid bruits LYMPHATICS: No lymphadenopathy CARDIAC: RRR, no murmurs, no rubs, no gallops RESPIRATORY:  Clear to auscultation without rales, wheezing or rhonchi  ABDOMEN: Soft, non-tender, non-distended MUSCULOSKELETAL:  No edema; No deformity  SKIN: Warm and dry LOWER EXTREMITIES: no swelling NEUROLOGIC:  Alert and oriented x 3 PSYCHIATRIC:  Normal affect   ASSESSMENT:    1. Coronary artery disease involving native coronary artery of native heart without angina pectoris   2. PAF (paroxysmal atrial fibrillation) ([place])   3. Nonsustained ventricular tachycardia ([place])   4. Mixed hyperlipidemia    PLAN:    In order of problems listed above:  Dizziness in this gentleman with history of ventricular tachycardia as well as paroxysmal atrial fibrillation, I will ask him to wear Zio patch for 2 weeks to see if he gets any significant arrhythmia. Coronary disease stable denies having any chest pain tightness squeezing pressure burning chest. Paroxysmal atrial fibrillation he is not anticoagulated I continue discussion about Watchman device today and they are willing to explore it however they are not eager to do that.  They want to have monitored first and see  if he gets any recurrence of atrial fibrillation if so he will be referred to our EP team in [facility] to talk about potentially having Watchman device implanted.  Luckily lately he did not have any falls but prior falls were significant to  the point that he lost his teeth when he fell down. Mixed dyslipidemia I did review his K PN which show me his LDL of 56 HDL 47, he is taking Lipitor 80 which I will continue History of nonsustained ventricular tachycardia: We will do a monitor   Medication Adjustments/Labs and Tests Ordered: Current medicines are reviewed at length with the patient today.  Concerns regarding medicines are outlined above.  No orders of the defined types were placed in this encounter.  Medication changes: No orders of the defined types were placed in this encounter.   Signed, Park Liter, MD, [facility] 12/27/2021 10:31 AM    [place]

## 2021-12-29 ENCOUNTER — Telehealth: Payer: Self-pay | Admitting: Cardiology

## 2021-12-29 NOTE — Telephone Encounter (Signed)
Patient's daughter states the patient's heart monitor came off and he is unable to find it. She states he only had it on for about 24 hours. She would like to know how patient needs to proceed. Will a new monitor need to be ordered? Please advise.

## 2021-12-29 NOTE — Telephone Encounter (Signed)
Spoke with daughter. Advised to call Zio for instruction on replacing [place].

## 2022-01-04 NOTE — Telephone Encounter (Signed)
Received email from [employer] that pt's monitor has fallen off. Pt is to send monitor back to company and they will send out a new monitor to patient.

## 2022-01-04 NOTE — Telephone Encounter (Signed)
Daughter called wanting a call back to confirm Dr. Agustin Cree still wants the patient wear a heart monitor.  Daughter stated she will be expecting the new heart monitor to arrive at the patient's [address] [place] address.

## 2022-01-04 NOTE — Telephone Encounter (Signed)
Called patient's daughter and reviewed how to apply a zio heart monitor with her so she will know how to apply it when the replacement heart monitor comes to their house from [employer]. Informed the patient's daughter that Dr. Agustin Cree still wanted the patient to wear the heart monitor. The daughter was agreeable with this plan and had no further questions at this time.

## 2022-01-14 ENCOUNTER — Telehealth: Payer: Self-pay

## 2022-01-14 NOTE — Chronic Care Management (AMB) (Signed)
Chronic Care Management Pharmacy Assistant   Name: Cole Eastridge Sr.  MRN: [id number] DOB: 1931-07-30  Reason for Encounter: Medication Review/ Medication coordination  Recent office visits:  None  Recent consult visits:  12-27-2021 Park Liter, MD (cardiology). Order placed for long term monitor. Referral placed to cardiac Electrophysiology.   Hospital visits:  None in previous 6 months  Medications: Outpatient Encounter Medications as of 01/14/2022  Medication Sig   acetaminophen (TYLENOL) 325 MG tablet Take 2 tablets (650 mg total) by mouth every 6 (six) hours as needed. (Patient taking differently: Take 650 mg by mouth every 6 (six) hours as needed for mild pain or moderate pain.)   albuterol (VENTOLIN HFA) 108 (90 Base) MCG/ACT inhaler Inhale 2 puffs into the lungs every 6 (six) hours as needed for wheezing or shortness of breath.   aspirin 81 MG EC tablet Take 1 tablet (81 mg total) by mouth daily. Swallow whole.   atorvastatin (LIPITOR) 80 MG tablet Take 1 tablet (80 mg total) by mouth daily.   ferrous sulfate (FERROUSUL) 325 (65 FE) MG tablet Take 1 tablet (325 mg total) by mouth daily with breakfast.   ipratropium-albuterol (DUONEB) 0.5-2.5 (3) MG/3ML SOLN Take 3 mLs by nebulization every 4 (four) hours as needed (shortness of breath or wheezing).   levothyroxine (SYNTHROID) 175 MCG tablet Take 1 tablet (175 mcg total) by mouth daily before breakfast.   metoprolol succinate (TOPROL-XL) 50 MG 24 hr tablet Take 1 tablet (50 mg total) by mouth daily. TAKE WITH OR IMMEDIATELY FOLLOWING A MEAL.   Multiple Vitamins-Minerals (MULTIVITAMIN ADULT PO) Take 1 tablet by mouth daily. Unknown strength   Nutritional Supplements (BOOST NUTRITIONAL ENERGY PO) Take 237 mLs by mouth in the morning and at bedtime. Drinking 1-2 per day   triamcinolone (KENALOG) 0.1 % Apply 1 application topically 2 (two) times daily as needed (rash).   No facility-administered encounter medications on  file as of 01/14/2022.  Reviewed chart for medication changes ahead of medication coordination call.  No hospital visits since last care coordination call/Pharmacist visit.   No medication changes indicated   BP Readings from Last 3 Encounters:  12/27/21 120/70  10/13/21 120/70  09/09/21 130/70    Lab Results  Component Value Date   HGBA1C 6.3 (H) 06/22/2020     Patient obtains medications through Adherence Packaging  30 Days   Last adherence delivery included:  Levothyroxine 184mg 1 BB Atorvastatin '80mg'$  1 B Aspirin '81mg'$  1 B Metoprolol Succ ER '50mg'$  1 B Ferrous Sulfate '325mg'$  1 B Vitamin B12 10045m 1 B Fexofenadine '180mg'$  1 B  Patient declined (meds) last month: Albuterol Inhaler- Pt has enough supply  Ipratropium-Albuterol Nebulizer- [facility]  Patient is due for next adherence delivery on: 01-26-2022  Called patient and reviewed medications and coordinated delivery.  This delivery to include: Levothyroxine 17525m1 BB Atorvastatin '80mg'$  1 B Aspirin '81mg'$  1 B Metoprolol Succ ER '50mg'$  1 B Ferrous Sulfate '325mg'$  1 B Vitamin B12 1000m64m B Fexofenadine '180mg'$  1 B  No acute/short fill needed  Patient declined the following medications: Albuterol Inhaler- Pt has enough supply  Ipratropium-Albuterol Nebulizer- plenty supply  Patient needs refills for: None  Confirmed delivery date of 01-26-2022 advised patient's daughter that pharmacy will contact them the morning of delivery.  Care Gaps: Shingrix overdue Covid booster overdue Tdap overdue Flu vaccine overdue  Star Rating Drugs: Atorvastatin 80 mg- Last filled 12-22-2021 30 DS  [facility]  Pharmacist Assistant [phone]

## 2022-02-01 DIAGNOSIS — I480 Paroxysmal atrial fibrillation: Secondary | ICD-10-CM | POA: Diagnosis not present

## 2022-02-09 ENCOUNTER — Telehealth: Payer: Self-pay | Admitting: *Deleted

## 2022-02-09 NOTE — Patient Outreach (Signed)
  Care Coordination   Initial Visit Note   02/09/2022 Name: James Platz Sr. MRN: [id number] DOB: 09-Jul-1931  James Iha Sr. is a 88 y.o. year old male who sees James Anes, MD for primary care. I spoke with  family of James Borawski Sr. by phone today.  What matters to the patients health and wellness today?  Declines Care Coordination services/support at this time    Goals Addressed   None     SDOH assessments and interventions completed:  No     Care Coordination Interventions Activated:  No  Care Coordination Interventions:  No, not indicated   Follow up plan: No further intervention required.      Encounter Outcome:  Pt. Refused

## 2022-02-14 ENCOUNTER — Other Ambulatory Visit: Payer: Self-pay

## 2022-02-14 ENCOUNTER — Telehealth: Payer: Self-pay

## 2022-02-14 MED ORDER — ATORVASTATIN CALCIUM 80 MG PO TABS: 80.0000 mg | ORAL_TABLET | Freq: Every day | ORAL | 1 refills | Status: DC

## 2022-02-14 NOTE — Chronic Care Management (AMB) (Signed)
Chronic Care Management Pharmacy Assistant   Name: James Haen Sr.  MRN: [id number] DOB: [birth year]  Reason for Encounter: Medication Review/ Medication coordination   Recent office visits:  None  Recent consult visits:  None  Hospital visits:  None in previous 6 months  Medications: Outpatient Encounter Medications as of 02/14/2022  Medication Sig   acetaminophen (TYLENOL) 325 MG tablet Take 2 tablets (650 mg total) by mouth every 6 (six) hours as needed. (Patient taking differently: Take 650 mg by mouth every 6 (six) hours as needed for mild pain or moderate pain.)   albuterol (VENTOLIN HFA) 108 (90 Base) MCG/ACT inhaler Inhale 2 puffs into the lungs every 6 (six) hours as needed for wheezing or shortness of breath.   aspirin 81 MG EC tablet Take 1 tablet (81 mg total) by mouth daily. Swallow whole.   atorvastatin (LIPITOR) 80 MG tablet Take 1 tablet (80 mg total) by mouth daily.   ferrous sulfate (FERROUSUL) 325 (65 FE) MG tablet Take 1 tablet (325 mg total) by mouth daily with breakfast.   ipratropium-albuterol (DUONEB) 0.5-2.5 (3) MG/3ML SOLN Take 3 mLs by nebulization every 4 (four) hours as needed (shortness of breath or wheezing).   levothyroxine (SYNTHROID) 175 MCG tablet Take 1 tablet (175 mcg total) by mouth daily before breakfast.   metoprolol succinate (TOPROL-XL) 50 MG 24 hr tablet Take 1 tablet (50 mg total) by mouth daily. TAKE WITH OR IMMEDIATELY FOLLOWING A MEAL.   Multiple Vitamins-Minerals (MULTIVITAMIN ADULT PO) Take 1 tablet by mouth daily. Unknown strength   Nutritional Supplements (BOOST NUTRITIONAL ENERGY PO) Take 237 mLs by mouth in the morning and at bedtime. Drinking 1-2 per day   triamcinolone (KENALOG) 0.1 % Apply 1 application topically 2 (two) times daily as needed (rash).   No facility-administered encounter medications on file as of 02/14/2022.  Reviewed chart for medication changes ahead of medication coordination call.  No hospital visits  since last care coordination call/Pharmacist visit.  No medication changes indicated  BP Readings from Last 3 Encounters:  12/27/21 120/70  10/13/21 120/70  09/09/21 130/70    Lab Results  Component Value Date   HGBA1C 6.3 (H) 06/22/2020     Patient obtains medications through Adherence Packaging  30 Days   Last adherence delivery included:  Levothyroxine 138mg 1 BB Atorvastatin '80mg'$  1 B Aspirin '81mg'$  1 B Metoprolol Succ ER '50mg'$  1 B Ferrous Sulfate '325mg'$  1 B Vitamin B12 10020m 1 B Fexofenadine '180mg'$  1 B  Patient declined (meds) last month: Albuterol Inhaler- Pt has enough supply  Ipratropium-Albuterol Nebulizer- plenty supply  Patient is due for next adherence delivery on: 02-24-2022  Called patient and reviewed medications and coordinated delivery.  This delivery to include: Levothyroxine 1753m1 BB Atorvastatin '80mg'$  1 B Aspirin '81mg'$  1 B Metoprolol Succ ER '50mg'$  1 B Ferrous Sulfate '325mg'$  1 B Vitamin B12 1000m47m B Fexofenadine '180mg'$  1 B  No short/acute fill needed  Patient declined the following medications: None  Patient needs refills for: Sent to PCP Atorvastatin  Notes: Patient's daughter wanted to see if Ipratropium-Albuterol Nebulizer was in stock. Confirmed with [name] that medication is still on back order. Patient's daughter informed and stated [facility] will [facility] to fill.  Confirmed delivery date of 02-24-2022 advised patient's daughter that pharmacy will contact them the morning of delivery.  Care Gaps: Shingrix overdue Covid booster overdue Tdap overdue Flu vaccine overdue  Star Rating Drugs: Atorvastatin  80 mg- Last filled 01-21-2022 30 DS  [place] Clinical Pharmacist Assistant [phone]

## 2022-02-15 ENCOUNTER — Telehealth: Payer: Self-pay | Admitting: Cardiology

## 2022-02-15 DIAGNOSIS — M1990 Unspecified osteoarthritis, unspecified site: Secondary | ICD-10-CM | POA: Diagnosis not present

## 2022-02-15 DIAGNOSIS — E611 Iron deficiency: Secondary | ICD-10-CM | POA: Diagnosis not present

## 2022-02-15 DIAGNOSIS — I493 Ventricular premature depolarization: Secondary | ICD-10-CM

## 2022-02-15 DIAGNOSIS — R69 Illness, unspecified: Secondary | ICD-10-CM | POA: Diagnosis not present

## 2022-02-15 DIAGNOSIS — E039 Hypothyroidism, unspecified: Secondary | ICD-10-CM | POA: Diagnosis not present

## 2022-02-15 DIAGNOSIS — Z833 Family history of diabetes mellitus: Secondary | ICD-10-CM | POA: Diagnosis not present

## 2022-02-15 DIAGNOSIS — E785 Hyperlipidemia, unspecified: Secondary | ICD-10-CM | POA: Diagnosis not present

## 2022-02-15 DIAGNOSIS — G3184 Mild cognitive impairment, so stated: Secondary | ICD-10-CM | POA: Diagnosis not present

## 2022-02-15 DIAGNOSIS — Z8249 Family history of ischemic heart disease and other diseases of the circulatory system: Secondary | ICD-10-CM | POA: Diagnosis not present

## 2022-02-15 DIAGNOSIS — I10 Essential (primary) hypertension: Secondary | ICD-10-CM | POA: Diagnosis not present

## 2022-02-15 DIAGNOSIS — J4489 Other specified chronic obstructive pulmonary disease: Secondary | ICD-10-CM | POA: Diagnosis not present

## 2022-02-15 DIAGNOSIS — Z7982 Long term (current) use of aspirin: Secondary | ICD-10-CM | POA: Diagnosis not present

## 2022-02-15 NOTE — Telephone Encounter (Signed)
The patient daughter [name] has been notified of the result and verbalized understanding.  All questions (if any) were answered. Precious Gilding, RN 02/15/2022 5:11 PM  Advised scheduler will call to set up OV with Dr. Curt Bears.

## 2022-02-15 NOTE — Telephone Encounter (Signed)
Compliant on meds 

## 2022-02-15 NOTE — Telephone Encounter (Signed)
Pt's daughter is requesting call in regards to her dad's monitor results. She states that she is going to see him today and would like to be able to give him the results.

## 2022-02-16 ENCOUNTER — Institutional Professional Consult (permissible substitution): Payer: Medicare HMO | Admitting: Cardiology

## 2022-02-21 ENCOUNTER — Ambulatory Visit: Payer: Medicare HMO | Attending: Cardiology | Admitting: Cardiology

## 2022-02-21 ENCOUNTER — Encounter: Payer: Self-pay | Admitting: Cardiology

## 2022-02-21 VITALS — BP 116/64 | HR 61 | Ht 72.0 in | Wt 136.0 lb

## 2022-02-21 DIAGNOSIS — D6869 Other thrombophilia: Secondary | ICD-10-CM

## 2022-02-21 DIAGNOSIS — I480 Paroxysmal atrial fibrillation: Secondary | ICD-10-CM

## 2022-02-21 DIAGNOSIS — I493 Ventricular premature depolarization: Secondary | ICD-10-CM | POA: Diagnosis not present

## 2022-02-21 NOTE — Patient Instructions (Signed)
Medication Instructions:  Your physician recommends that you continue on your current medications as directed. Please refer to the Current Medication list given to you today.  *If you need a refill on your cardiac medications before your next appointment, please call your pharmacy*   Lab Work: None ordered   Testing/Procedures: None ordered   Follow-Up: At [facility], you and your health needs are our priority.  As part of our continuing mission to provide you with exceptional heart care, we have created designated Provider Care Teams.  These Care Teams include your primary Cardiologist (physician) and Advanced Practice Providers (APPs -  Physician Assistants and Nurse Practitioners) who all work together to provide you with the care you need, when you need it.  Your next appointment:   6 month(s)  The format for your next appointment:   In Person  Provider:   Will Camnitz, MD    Thank you for choosing [facility]!!   Annelle Behrendt, RN [phone]  Other Instructions   Important Information About Sugar           

## 2022-02-21 NOTE — Progress Notes (Signed)
Electrophysiology Office Note   Date:  02/21/2022   ID:  James Iha Sr., DOB 26-Jan-1932, MRN [id number]  PCP:  [name], MD  Cardiologist:  Agustin Cree Primary Electrophysiologist:  Jovon Winterhalter Meredith Leeds, MD    Chief Complaint: PVC   History of Present Illness: James Ranganathan Sr. is a 88 y.o. male who is being seen today for the evaluation of PVC at the request of Park Liter, MD. Presenting today for electrophysiology evaluation.  He has a history significant for coronary artery disease, hypertension, COPD, hyperlipidemia, paroxysmal atrial fibrillation.  He presented to [facility] February 2023 with rapid atrial fibrillation.  Left heart catheterization showed a 90% LAD lesion post stenting.  He was started on Cardizem and converted to sinus rhythm.  He was not anticoagulated due to a fall.  Today, he denies symptoms of palpitations, chest pain, shortness of breath, orthopnea, PND, lower extremity edema, claudication, dizziness, presyncope, syncope, bleeding, or neurologic sequela. The patient is tolerating medications without difficulties.  He is unaware of his PVCs.  He states that he is able to do all of his daily activities.  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  He does get intermittently short of breath, but due to his lung issues and allergies, he feels that it may be related to this.   Past Medical History:  Diagnosis Date   Acquired hypothyroidism 11/09/2593   Acute systolic heart failure ([place]) 06/25/2020   Anemia 02/05/2020   boarderline anemic   Atopic dermatitis 12/05/2019   Bacterial pneumonia, unspecified 06/14/2012   06/14/2012 CXR w/ RML PNA >Zithromax and Rocephin     Bradycardia 11/28/2018   CAD (coronary artery disease) 07/07/2020   Cancer ([place])    prostate cancer-seed implant   Closed fracture dislocation of right elbow    Closed fracture of right olecranon process 04/10/2020   COPD (chronic obstructive  pulmonary disease) ([place]) 01/21/2020   Dermatitis 02/05/2020   Dizziness 02/19/2016   Essential hypertension 11/28/2018   History of prostate cancer    seed implant   Hyperlipemia    Hyperlipidemia, mixed 08/21/2008   Qualifier: Diagnosis of  By: Annamaria Boots MD, Clinton D    LVH (left ventricular hypertrophy) 11/28/2018   Malnutrition of moderate degree ([place]) 08/15/2019   Mixed conductive and sensorineural hearing loss, 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   Moderate persistent asthma, uncomplicated 6/38/7564   Nonsustained ventricular tachycardia ([place]) 01/30/2019   NSTEMI (non-ST elevated myocardial infarction) ([place]) 06/21/2020   Other fatigue 02/05/2020   PAF (paroxysmal atrial fibrillation) ( [place]) 06/25/2020   Seasonal and perennial allergic rhinitis 05/17/2007       Senile osteoporosis 08/15/2019   SINUSITIS, ACUTE 06/28/2007   Qualifier: Diagnosis of  By: Royal Piedra NP, Tammy     Ventricular ectopy 11/28/2018   Past Surgical History:  Procedure Laterality Date   APPENDECTOMY     CORONARY ATHERECTOMY N/A 06/24/2020   Procedure: CORONARY ATHERECTOMY;  Surgeon: Wellington Hampshire, MD;  Location: [facility];  Service: Cardiovascular;  Laterality: N/A;   INTRAVASCULAR ULTRASOUND/IVUS N/A 06/24/2020   Procedure: Intravascular Ultrasound/IVUS;  Surgeon: Wellington Hampshire, MD;  Location: [facility];  Service: Cardiovascular;  Laterality: N/A;   LEFT HEART CATH AND CORONARY ANGIOGRAPHY N/A 06/22/2020   Procedure: LEFT HEART CATH AND CORONARY ANGIOGRAPHY;  Surgeon: Nelva Bush, MD;  Location: [facility];  Service: Cardiovascular;  Laterality: N/A;   ORIF ELBOW FRACTURE Right 04/10/2020   Procedure:  OPEN REDUCTION INTERNAL FIXATION (ORIF) ELBOW/OLECRANON FRACTURE;  Surgeon: Marchia Bond, MD;  Location: [facility];  Service: Orthopedics;  Laterality: Right;     Current Outpatient Medications  Medication Sig Dispense Refill   acetaminophen (TYLENOL) 325 MG tablet Take 2 tablets (650 mg  total) by mouth every 6 (six) hours as needed. (Patient taking differently: Take 650 mg by mouth every 6 (six) hours as needed for mild pain or moderate pain.) 30 tablet 1   albuterol (VENTOLIN HFA) 108 (90 Base) MCG/ACT inhaler Inhale 2 puffs into the lungs every 6 (six) hours as needed for wheezing or shortness of breath. 18 each 12   aspirin 81 MG EC tablet Take 1 tablet (81 mg total) by mouth daily. Swallow whole. 90 tablet 3   atorvastatin (LIPITOR) 80 MG tablet Take 1 tablet (80 mg total) by mouth daily. 90 tablet 1   ferrous sulfate (FERROUSUL) 325 (65 FE) MG tablet Take 1 tablet (325 mg total) by mouth daily with breakfast. 30 tablet 12   ipratropium-albuterol (DUONEB) 0.5-2.5 (3) MG/3ML SOLN Take 3 mLs by nebulization every 4 (four) hours as needed (shortness of breath or wheezing). 360 mL 2   levothyroxine (SYNTHROID) 175 MCG tablet Take 1 tablet (175 mcg total) by mouth daily before breakfast. 90 tablet 1   metoprolol succinate (TOPROL-XL) 50 MG 24 hr tablet Take 1 tablet (50 mg total) by mouth daily. TAKE WITH OR IMMEDIATELY FOLLOWING A MEAL. 90 tablet 3   Multiple Vitamins-Minerals (MULTIVITAMIN ADULT PO) Take 1 tablet by mouth daily. Unknown strength     Nutritional Supplements (BOOST NUTRITIONAL ENERGY PO) Take 237 mLs by mouth in the morning and at bedtime. Drinking 1-2 per day     triamcinolone (KENALOG) 0.1 % Apply 1 application topically 2 (two) times daily as needed (rash).     No current facility-administered medications for this visit.    Allergies:   Clarithromycin   Social History:  The patient  reports that he quit smoking about 63 years ago. His smoking use included cigarettes. He quit smokeless tobacco use about 12 years ago.  His smokeless tobacco use included chew. He reports that he does not drink alcohol and does not use drugs.   Family History:  The patient's family history includes Alzheimer's disease in his father; Diabetes in his mother.    ROS:  Please see  the history of present illness.   Otherwise, review of systems is positive for none.   All other systems are reviewed and negative.    PHYSICAL EXAM: VS:  BP 116/64   Pulse 61   Ht 6' (1.829 m)   Wt 136 lb (61.7 kg)   SpO2 99%   BMI 18.44 kg/m  , BMI Body mass index is 18.44 kg/m. GEN: Well nourished, well developed, in no acute distress  HEENT: normal  Neck: no JVD, carotid bruits, or masses Cardiac: RRR; no murmurs, rubs, or gallops,no edema  Respiratory:  clear to auscultation bilaterally, normal work of breathing GI: soft, nontender, nondistended, + BS MS: no deformity or atrophy  Skin: warm and dry Neuro:  Strength and sensation are intact Psych: euthymic mood, full affect  EKG:  EKG is ordered today. Personal review of the ekg ordered shows sinus rhythm, voltage criteria for LVH, rate 61  Recent Labs: 04/28/2021: TSH 1.710 10/13/2021: ALT 17; BUN 18; Creatinine, Ser 0.76; Hemoglobin 11.0; Platelets 225; Potassium 4.8; Sodium 137    Lipid Panel     Component Value Date/Time  CHOL 117 04/28/2021 1147   TRIG 66 04/28/2021 1147   HDL 47 04/28/2021 1147   CHOLHDL 2.5 04/28/2021 1147   LDLCALC 56 04/28/2021 1147     Wt Readings from Last 3 Encounters:  02/21/22 136 lb (61.7 kg)  12/27/21 131 lb 3.2 oz (59.5 kg)  10/13/21 135 lb (61.2 kg)      Other studies Reviewed: Additional studies/ records that were reviewed today include: TTE 06/23/20  Review of the above records today demonstrates:   1. Left ventricular ejection fraction, by estimation, is 55 to 60%. The  left ventricle has normal function. The left ventricle has no regional  wall motion abnormalities. Left ventricular diastolic function could not  be evaluated.   2. Right ventricular systolic function is normal. The right ventricular  size is normal. There is moderately elevated pulmonary artery systolic  pressure. The estimated right ventricular systolic pressure is 57.3 mmHg.   3. Left atrial size was  mildly dilated.   4. The mitral valve is normal in structure. Trivial mitral valve  regurgitation. No evidence of mitral stenosis.   5. Tricuspid valve regurgitation is moderate.   6. The aortic valve is tricuspid. There is mild calcification of the  aortic valve. There is moderate thickening of the aortic valve. Aortic  valve regurgitation is trivial. Mild to moderate aortic valve  sclerosis/calcification is present, without any  evidence of aortic stenosis.   7. The inferior vena cava is dilated in size with >50% respiratory  variability, suggesting right atrial pressure of 8 mmHg.   Left heart catheterization 06/22/2020 Severe single-vessel coronary artery disease with heavily calcified mid LAD stenosis of up to 90%. Mild to moderate, ostial LMCA, proximal LAD, OM2, and proximal RCA disease. Moderately to severely reduced left ventricular systolic function with mid/apical anterior and apical hypokinesis/akinesis. Moderately elevated left ventricular filling pressure (LVEDP ~25 mmHg). Ost LM lesion is 30% stenosed. Prox LAD lesion is 25% stenosed. 2nd Diag lesion is 50% stenosed. Mid LAD lesion is 90% stenosed. 2nd Mrg lesion is 35% stenosed. Post intervention, there is a 0% residual stenosis. A drug-eluting stent was successfully placed using a [place] [id number].  Cardiac monitor 02/10/2022 personally reviewed Frequent ventricular ectopy in the neighborhood of 15%. Multiple episodes of ventricular tachycardia, however many of those episodes interpreted as ventricular tachycardia Bardy monitor showing some ventricular ectopy with some aberrantly conducted QRS complexes with origin of sinus.  So total number of ventricular tachycardia is less than reported however overall obviously concerning.  ASSESSMENT AND PLAN:  1.  Coronary artery disease: Status post LAD stent.  No current chest pain.  Plan per primary cardiology.  2.  Paroxysmal atrial fibrillation: CHA2DS2-VASc of at  least 5.  Currently not anticoagulated.  Remains in sinus rhythm.  3.  PVC/nonsustained VT: 15% burden noted on cardiac monitor.  Fortunately his ejection fraction is remained normal.  He seems completely asymptomatic from his PVCs.  At this point, we [name] continue to monitor without further intervention.  If he does develop symptoms, or if his ejection fraction goes down, antiarrhythmics such as amiodarone versus mexiletine would be a reasonable option.  4.  Hypertension: Currently well controlled  5.  Secondary hypercoagulable state: Currently not anticoagulated for atrial fibrillation due to frequent falls.  There have been discussions on watchman implant.    Current medicines are reviewed at length with the patient today.   The patient does not have concerns regarding his medicines.  The following changes were made today:  none  Labs/ tests ordered today include:  Orders Placed This Encounter  Procedures   EKG 12-Lead     Disposition:   FU with Djon Tith 6 months  Signed, Leviticus Harton Meredith Leeds, MD  02/21/2022 10:17 AM     [facility] [address] [place] [address] [phone] (office) [phone] (fax)

## 2022-02-24 ENCOUNTER — Telehealth: Payer: Self-pay

## 2022-02-24 NOTE — Telephone Encounter (Signed)
Message was addressed, patient seen with Dr. Audie Box

## 2022-02-24 NOTE — Telephone Encounter (Signed)
-----   Message from Park Liter, MD sent at 02/15/2022 10:16 AM EDT ----- Monitor showed frequent ventricular ectopy, please refer him to Dr. Curt Bears for evaluation

## 2022-03-01 ENCOUNTER — Telehealth: Payer: Medicare HMO

## 2022-03-09 ENCOUNTER — Telehealth: Payer: Medicare HMO

## 2022-03-16 ENCOUNTER — Telehealth: Payer: Self-pay

## 2022-03-16 NOTE — Progress Notes (Signed)
    Chronic Care Management Pharmacy Assistant   Name: James Arthurs Sr.  MRN: [id number] DOB: 31-Oct-1931  Reason for Encounter: Medication Review/ Medication Coordination  Recent office visits:  None  Recent consult visits:  02-21-2022 Constance Haw, MD (Cardiology). EKG completed.  Hospital visits:  None in previous 6 months  Medications: Outpatient Encounter Medications as of 03/16/2022  Medication Sig   acetaminophen (TYLENOL) 325 MG tablet Take 2 tablets (650 mg total) by mouth every 6 (six) hours as needed. (Patient taking differently: Take 650 mg by mouth every 6 (six) hours as needed for mild pain or moderate pain.)   albuterol (VENTOLIN HFA) 108 (90 Base) MCG/ACT inhaler Inhale 2 puffs into the lungs every 6 (six) hours as needed for wheezing or shortness of breath.   aspirin 81 MG EC tablet Take 1 tablet (81 mg total) by mouth daily. Swallow whole.   atorvastatin (LIPITOR) 80 MG tablet Take 1 tablet (80 mg total) by mouth daily.   ferrous sulfate (FERROUSUL) 325 (65 FE) MG tablet Take 1 tablet (325 mg total) by mouth daily with breakfast.   ipratropium-albuterol (DUONEB) 0.5-2.5 (3) MG/3ML SOLN Take 3 mLs by nebulization every 4 (four) hours as needed (shortness of breath or wheezing).   levothyroxine (SYNTHROID) 175 MCG tablet Take 1 tablet (175 mcg total) by mouth daily before breakfast.   metoprolol succinate (TOPROL-XL) 50 MG 24 hr tablet Take 1 tablet (50 mg total) by mouth daily. TAKE WITH OR IMMEDIATELY FOLLOWING A MEAL.   Multiple Vitamins-Minerals (MULTIVITAMIN ADULT PO) Take 1 tablet by mouth daily. Unknown strength   Nutritional Supplements (BOOST NUTRITIONAL ENERGY PO) Take 237 mLs by mouth in the morning and at bedtime. Drinking 1-2 per day   triamcinolone (KENALOG) 0.1 % Apply 1 application topically 2 (two) times daily as needed (rash).   No facility-administered encounter medications on file as of 03/16/2022.  Reviewed chart for medication changes  ahead of medication coordination call.  No hospital visits since last care coordination call/Pharmacist visit.   No medication changes indicated   BP Readings from Last 3 Encounters:  02/21/22 116/64  12/27/21 120/70  10/13/21 120/70    Lab Results  Component Value Date   HGBA1C 6.3 (H) 06/22/2020     Patient obtains medications through Adherence Packaging  30 Days   Last adherence delivery included:  Levothyroxine 167mg 1 BB Atorvastatin '80mg'$  1 B Aspirin '81mg'$  1 B Metoprolol Succ ER '50mg'$  1 B Ferrous Sulfate '325mg'$  1 B Vitamin B12 10056m 1 B Fexofenadine '180mg'$  1 B  Patient declined (meds) last month: None   Patient is due for next adherence delivery on: 03-28-2022  Called patient and reviewed medications and coordinated delivery.  This delivery to include: Levothyroxine 17569m1 BB Atorvastatin '80mg'$  1 B Aspirin '81mg'$  1 B Metoprolol Succ ER '50mg'$  1 B Ferrous Sulfate '325mg'$  1 B Vitamin B12 1000m76m B Fexofenadine '180mg'$  1 B  No acute/short fill needed  Patient declined the following medications: Albuterol Inhaler- Pt has enough supply  Ipratropium-Albuterol Nebulizer- plenty supply Kenelog cream- not using Tylenol- OTC Multivitamin- OTC  Patient needs refills for: None  Confirmed delivery date of 03-28-2022 advised patient that pharmacy will contact them the morning of delivery.  Care Gaps: Shingrix overdue Covid Booster overdue Tdap overdue AWV overdue  Star Rating Drugs: Atorvastatin 80 mg- Last filled 02-21-2022 30 DS. Previous 01-21-2022 30 DS  MaleHatchnical Pharmacist Assistant [phone]

## 2022-03-23 ENCOUNTER — Telehealth: Payer: Medicare HMO

## 2022-03-30 ENCOUNTER — Ambulatory Visit (INDEPENDENT_AMBULATORY_CARE_PROVIDER_SITE_OTHER): Payer: Medicare HMO

## 2022-03-30 VITALS — BP 126/74 | HR 66

## 2022-03-30 DIAGNOSIS — I10 Essential (primary) hypertension: Secondary | ICD-10-CM

## 2022-03-30 DIAGNOSIS — E782 Mixed hyperlipidemia: Secondary | ICD-10-CM

## 2022-03-30 DIAGNOSIS — E039 Hypothyroidism, unspecified: Secondary | ICD-10-CM

## 2022-03-30 NOTE — Progress Notes (Signed)
Chronic Care Management Pharmacy Note  03/30/2022 Name:  James Malter Sr. MRN:  [id number] DOB:  [birth year]  Summary: -Patient is very mobile, able to drive on his own and drives to his Doctor appointments. He has 2 children, daughter and son, who will try to accompany him to these.  Recommendations/Changes made from today's visit: -Onboard to [facility] -Recommend DEXA scan (Last done 2019)  -Asked daughter to schedule f/u, patient needs labs -Patient due for vaccines (Shingriz, Covid, Tdap, Flu)  Subjective: James Remedios Sr. is an 88 y.o. year old male who is a primary patient of [name], Zeb Comfort, MD.  The CCM team was consulted for assistance with disease management and care coordination needs.    Engaged with patient by telephone for follow up visit in response to provider referral for pharmacy case management and/or care coordination services.   Consent to Services:  The patient was given the following information about Chronic Care Management services today, agreed to services, and gave verbal consent: 1. CCM service includes personalized support from designated clinical staff supervised by the primary care provider, including individualized plan of care and coordination with other care providers 2. 24/7 contact phone numbers for assistance for urgent and routine care needs. 3. Service will only be billed when office clinical staff spend 20 minutes or more in a month to coordinate care. 4. Only one practitioner may furnish and bill the service in a calendar month. 5.The patient may stop CCM services at any time (effective at the end of the month) by phone call to the office staff. 6. The patient will be responsible for cost sharing (co-pay) of up to 20% of the service fee (after annual deductible is met). Patient agreed to services and consent obtained.  Patient Care Team: [name], MD as PCP - General (Family Medicine) Park Liter, MD as PCP -  Cardiology (Cardiology) Constance Haw, MD as PCP - Electrophysiology (Cardiology) Lane Hacker, [facility] (Pharmacist)  Recent office visits:  None   Recent consult visits:  None   Hospital visits:  None in previous 6 months   Objective:  Lab Results  Component Value Date   CREATININE 0.76 10/13/2021   BUN 18 10/13/2021   EGFR 86 10/13/2021   GFRNONAA 78 06/29/2020   GFRAA 90 06/29/2020   NA 137 10/13/2021   K 4.8 10/13/2021   CALCIUM 9.1 10/13/2021   CO2 23 10/13/2021   GLUCOSE 93 10/13/2021    Lab Results  Component Value Date/Time   HGBA1C 6.3 (H) 06/22/2020 05:13 AM    Last diabetic Eye exam: No results found for: "HMDIABEYEEXA"  Last diabetic Foot exam: No results found for: "HMDIABFOOTEX"   Lab Results  Component Value Date   CHOL 117 04/28/2021   HDL 47 04/28/2021   LDLCALC 56 04/28/2021   TRIG 66 04/28/2021   CHOLHDL 2.5 04/28/2021       Latest Ref Rng & Units 10/13/2021    5:06 PM 04/28/2021   11:47 AM 12/15/2020   10:30 AM  Hepatic Function  Total Protein 6.0 - 8.5 g/dL 6.5  6.3  6.6   Albumin 3.6 - 4.6 g/dL 4.4  4.1  4.4   AST 0 - 40 IU/L _0 ALT 0 - 44 IU/L _1 Alk Phosphatase 44 - 121 IU/L 51  64  73   Total Bilirubin 0.0 - 1.2 mg/dL <0.2  0.3  0.3  Lab Results  Component Value Date/Time   TSH 1.710 04/28/2021 11:47 AM   TSH 3.870 01/25/2021 11:49 AM   FREET4 1.66 08/15/2019 09:59 AM       Latest Ref Rng & Units 10/13/2021    5:06 PM 04/28/2021   11:47 AM 12/15/2020   10:30 AM  CBC  WBC 3.4 - 10.8 x10E3/uL 8.9  6.2  9.3   Hemoglobin 13.0 - 17.7 g/dL 11.0  11.2  11.8   Hematocrit 37.5 - 51.0 % 33.5  35.9  36.2   Platelets 150 - 450 x10E3/uL 225  238  209     No results found for: "VD25OH"  Clinical ASCVD: Yes  The ASCVD Risk score (Arnett DK, et al., 2019) failed to calculate for the following reasons:   The 2019 ASCVD risk score is only valid for ages 71 to 55   The patient has a prior MI or stroke  diagnosis       01/29/2021    1:14 PM 12/15/2020   10:03 AM 09/04/2019   11:25 AM  Depression screen PHQ 2/9  Decreased Interest 0 0 0  Down, Depressed, Hopeless 0 0 0  PHQ - 2 Score 0 0 0     Other: (CHADS2VASc if Afib, MMRC or CAT for COPD, ACT, DEXA)  Social History   Tobacco Use  Smoking Status Former   Types: Cigarettes   Quit date: 1960   Years since quitting: 63.9  Smokeless Tobacco Former   Types: Chew   Quit date: 2011   BP Readings from Last 3 Encounters:  03/30/22 126/74  02/21/22 116/64  12/27/21 120/70   Pulse Readings from Last 3 Encounters:  03/30/22 66  02/21/22 61  12/27/21 (!) 46   Wt Readings from Last 3 Encounters:  02/21/22 136 lb (61.7 kg)  12/27/21 131 lb 3.2 oz (59.5 kg)  10/13/21 135 lb (61.2 kg)   BMI Readings from Last 3 Encounters:  02/21/22 18.44 kg/m  12/27/21 17.79 kg/m  10/13/21 18.31 kg/m    Assessment/Interventions: Review of patient past medical history, allergies, medications, health status, including review of consultants reports, laboratory and other test data, was performed as part of comprehensive evaluation and provision of chronic care management services.   SDOH:  (Social Determinants of Health) assessments and interventions performed: Yes SDOH Interventions    Flowsheet Row Chronic Care Management from 03/30/2022 in [place] Management from 08/31/2021 in [place] from 01/29/2021 in [place] Interventions     Food Insecurity Interventions -- -- Intervention Not Indicated  Housing Interventions -- -- Intervention Not Indicated  Transportation Interventions Intervention Not Indicated Intervention Not Indicated Intervention Not Indicated  Financial Strain Interventions Intervention Not Indicated Intervention Not Indicated Intervention Not Indicated  Physical Activity Interventions -- -- Intervention Not Indicated  Stress Interventions -- -- Intervention  Not Indicated  Social Connections Interventions -- -- Intervention Not Indicated      SDOH Screenings   Food Insecurity: No Food Insecurity (01/29/2021)  Housing: Low Risk  (01/29/2021)  Transportation Needs: No Transportation Needs (03/30/2022)  Depression (PHQ2-9): Low Risk  (01/29/2021)  Financial Resource Strain: Low Risk  (03/30/2022)  Physical Activity: Insufficiently Active (01/29/2021)  Social Connections: Socially Integrated (01/29/2021)  Stress: No Stress Concern Present (01/29/2021)  Tobacco Use: Medium Risk (02/21/2022)    CCM Care Plan  Allergies  Allergen Reactions   Clarithromycin     REACTION: GI upset    Medications Reviewed Today  Reviewed by Lane Hacker, [place] (Pharmacist) on 03/30/22 at 1439  Med List Status: <None>   Medication Order Taking? Sig Documenting Provider Last Dose Status Informant  acetaminophen (TYLENOL) 325 MG tablet [id number] No Take 2 tablets (650 mg total) by mouth every 6 (six) hours as needed.  Patient taking differently: Take 650 mg by mouth every 6 (six) hours as needed for mild pain or moderate pain.   Ventura Bruns, PA-C Taking Active   albuterol (VENTOLIN HFA) 108 (90 Base) MCG/ACT inhaler [id number] No Inhale 2 puffs into the lungs every 6 (six) hours as needed for wheezing or shortness of breath. [name], MD Taking Active   aspirin 81 MG EC tablet [id number] No Take 1 tablet (81 mg total) by mouth daily. Swallow whole. Park Liter, MD Taking Active   atorvastatin (LIPITOR) 80 MG tablet [id number] No Take 1 tablet (80 mg total) by mouth daily. [name], MD Taking Active   ferrous sulfate (FERROUSUL) 325 (65 FE) MG tablet [id number] No Take 1 tablet (325 mg total) by mouth daily with breakfast. [name], MD Taking Active   ipratropium-albuterol (DUONEB) 0.5-2.5 (3) MG/3ML SOLN [id number] No Take 3 mLs by nebulization every 4 (four) hours as needed (shortness of breath or wheezing).  [name], MD Taking Active   levothyroxine (SYNTHROID) 175 MCG tablet [id number] No Take 1 tablet (175 mcg total) by mouth daily before breakfast. [name], MD Taking Active   metoprolol succinate (TOPROL-XL) 50 MG 24 hr tablet [id number] No Take 1 tablet (50 mg total) by mouth daily. TAKE WITH OR IMMEDIATELY FOLLOWING A MEAL. [name], MD Taking Active   Multiple Vitamins-Minerals (MULTIVITAMIN ADULT PO) [id number] No Take 1 tablet by mouth daily. Unknown strength [provider] Taking Active Child  Nutritional Supplements (BOOST NUTRITIONAL ENERGY PO) [id number] No Take 237 mLs by mouth in the morning and at bedtime. Drinking 1-2 per day [provider] Taking Active Child  triamcinolone (KENALOG) 0.1 % [id number] No Apply 1 application topically 2 (two) times daily as needed (rash). [provider] Taking Active Child            Patient Active Problem List   Diagnosis Date Noted   Melena 10/13/2021   Hoarse voice quality 11/14/2020   CAD (coronary artery disease) 07/07/2020   Closed fracture dislocation of right elbow    Cancer ([place])    PAF (paroxysmal atrial fibrillation) ([place]) 16/55/3748   Acute systolic heart failure ([place]) 06/25/2020   NSTEMI (non-ST elevated myocardial infarction) ([place]) 06/21/2020   Closed fracture of right olecranon process 04/10/2020   Hyperlipemia    History of prostate cancer    Other fatigue 02/05/2020   Dermatitis 02/05/2020   Anemia 02/05/2020   Moderate persistent asthma, uncomplicated 27/11/8673   COPD (chronic obstructive pulmonary disease) ([place]) 01/21/2020   Atopic dermatitis 12/05/2019   Acquired hypothyroidism 08/15/2019   Mixed conductive and sensorineural hearing loss, 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   Malnutrition of moderate degree ([place]) 08/15/2019   Senile osteoporosis 08/15/2019   Nonsustained ventricular tachycardia ([place]) 01/30/2019   Bradycardia 11/28/2018   Ventricular ectopy  11/28/2018   Essential hypertension 11/28/2018   LVH (left ventricular hypertrophy) 11/28/2018   Dizziness 02/19/2016   Bacterial pneumonia, unspecified 06/14/2012   Hyperlipidemia, mixed 08/21/2008   SINUSITIS, ACUTE 06/28/2007   Seasonal and perennial allergic rhinitis 05/17/2007    Immunization History  Administered Date(s) Administered   Fluad Quad(high Dose 65+) 03/22/2021   Influenza Split 02/06/2013,  02/18/2015   Influenza Whole 02/07/2011, 03/15/2012   Influenza, High Dose Seasonal PF 02/01/2017, 01/17/2018   Influenza,inj,Quad PF,6+ Mos 01/07/2014   Influenza-Unspecified 01/21/2020, 03/09/2022   PFIZER(Purple Top)SARS-COV-2 Vaccination 06/10/2019, 07/01/2019, 02/28/2020   Pneumococcal Conjugate-13 12/31/2014   Pneumococcal Polysaccharide-23 02/07/2011   Tdap 08/01/2011    Conditions to be addressed/monitored:  Hyperlipidemia, Atrial Fibrillation, and Hypothyroidism  Care Plan : [place]  Updates made by Lane Hacker, RPH since 03/30/2022 12:00 AM     Problem: COPD, BP, Thyroid   Priority: High     Long-Range Goal: Disease State Management   Start Date: 08/31/2021  Expected End Date: 08/31/2022  Recent Progress: On track  Priority: High  Note:   Current Barriers:  Unable to self administer medications as prescribed  Pharmacist Clinical Goal(s):  Patient will achieve ability to self administer medications as prescribed through use of packs as evidenced by patient report through collaboration with PharmD and provider.   Interventions: 1:1 collaboration with [name], MD regarding development and update of comprehensive plan of care as evidenced by provider attestation and co-signature Inter-disciplinary care team collaboration (see longitudinal plan of care) Comprehensive medication review performed; medication list updated in electronic medical record  Hyperlipidemia: (LDL goal < 70) The ASCVD Risk score (Arnett DK, et al.,  2019) failed to calculate for the following reasons:   The 2019 ASCVD risk score is only valid for ages 40 to 57   The patient has a prior MI or stroke diagnosis Lab Results  Component Value Date   CHOL 117 04/28/2021   CHOL 127 12/15/2020   CHOL 123 09/08/2020   Lab Results  Component Value Date   HDL 47 04/28/2021   HDL 47 12/15/2020   HDL 43 09/08/2020   Lab Results  Component Value Date   LDLCALC 56 04/28/2021   LDLCALC 64 12/15/2020   LDLCALC 64 09/08/2020   Lab Results  Component Value Date   TRIG 66 04/28/2021   TRIG 80 12/15/2020   TRIG 82 09/08/2020   Lab Results  Component Value Date   CHOLHDL 2.5 04/28/2021   CHOLHDL 2.7 12/15/2020   CHOLHDL 2.9 09/08/2020  No results found for: "LDLDIRECT" -Controlled -Current treatment: Atorvastatin 18m Appropriate, Effective, Safe, Accessible -Medications previously tried: N/A  -Current dietary patterns: "Tries to eat healthy" -Current exercise habits: N/A -Educated on Cholesterol goals;  -Recommended to continue current medication  Atrial Fibrillation (Goal: prevent stroke and major bleeding) -Controlled -CHADSVASC: 5 -Current treatment: Rate control:  Metoprolol Succ 539mAppropriate, Effective, Safe, Accessible Anticoagulation: Only ASA due to fall risk ASA 8183mppropriate, Effective, Safe, Accessible -Medications previously tried: Clopidogrel -Home BP and HR readings: Didn't have readings  -Counseled on seeking medical attention after a head injury or if there is blood in the urine/stool; -Recommended to continue current medication   Osteoporosis / Osteopenia (Goal Prevent fractures) -Not ideally controlled -Last DEXA Scan: March 2019 (Unable to find results) -Patient is a candidate for pharmacologic treatment due to   -Current treatment  N/A -Medications previously tried: Alendronate (Started 2009-Stop 2023) -Recommend [phone] units of vitamin D daily. April 2023: Will ask PCP about Alendronate  length of therapy November 2023: Will ask PCP for DEXA Scan  Thyroid (Goal TSH: 0.4-5.0) Lab Results  Component Value Date   TSH 1.710 04/28/2021  -Controlled -Current treatment: Levothyroxine 175m7mppropriate, Effective, Query Safe, Accessible -Counseled to take medication on an empty stomach April 2023: Recommend decreasing dose for a goal on the higher  end of 0.4-5.0 due to osteo risk November 2023: Recommend higher goal. Coordinated with daughter to get patient scheduled for f/u and labs   Patient Goals/Self-Care Activities Patient will:  - take medications as prescribed as evidenced by patient report and record review  Follow Up Plan: The patient has been provided with contact information for the care management team and has been advised to call with any health related questions or concerns.   CPP F/U prn  [place] Constable, Pharm.D. - [phone]        Medication Assistance: Utilizing Enhanced Pharmacy Services Packs.  Compliance/Adherence/Medication fill history: Care Gaps: Shingrix overdue Covid booster overdue Tdap overdue Flu vaccine overdue    Patient's preferred pharmacy is:  [id number] [place]Phone: [phone]: [phone] [facility] [address]Phone: [phone]: [phone] [facility] [place] [address] [place]Phone: [phone]: [phone] Uses pill box? Yes Pt endorses 50% compliance  We discussed: Benefits of medication synchronization, packaging and delivery as well as enhanced pharmacist oversight with [facility]. Patient decided to: Utilize [facility] pharmacy for medication synchronization, packaging and delivery -Verbal consent obtained for [facility] enhanced  pharmacy services (medication synchronization, adherence packaging, delivery coordination). A medication sync plan was created to allow patient to get all medications delivered once every 30 to 90 days per patient preference. Patient understands they have freedom to choose pharmacy and clinical pharmacist will coordinate care between all prescribers and [facility].   Care Plan and Follow Up Patient Decision:  Patient agrees to Care Plan and Follow-up.  Plan: The patient has been provided with contact information for the care management team and has been advised to call with any health related questions or concerns.   CPP F/U October 2023  NAri[place] Constable PSherian ReinD. -- [phone]

## 2022-03-30 NOTE — Patient Instructions (Signed)
Visit Information   Goals Addressed   None    Patient Care Plan: CCM Pharmacy Care Plan     Problem Identified: COPD, BP, Thyroid   Priority: High     Long-Range Goal: Disease State Management   Start Date: 08/31/2021  Expected End Date: 08/31/2022  Recent Progress: On track  Priority: High  Note:   Current Barriers:  Unable to self administer medications as prescribed  Pharmacist Clinical Goal(s):  Patient will achieve ability to self administer medications as prescribed through use of packs as evidenced by patient report through collaboration with PharmD and provider.   Interventions: 1:1 collaboration with [name], MD regarding development and update of comprehensive plan of care as evidenced by provider attestation and co-signature Inter-disciplinary care team collaboration (see longitudinal plan of care) Comprehensive medication review performed; medication list updated in electronic medical record  Hyperlipidemia: (LDL goal < 70) The ASCVD Risk score (Arnett DK, et al., 2019) failed to calculate for the following reasons:   The 2019 ASCVD risk score is only valid for ages 71 to 81   The patient has a prior MI or stroke diagnosis Lab Results  Component Value Date   CHOL 117 04/28/2021   CHOL 127 12/15/2020   CHOL 123 09/08/2020   Lab Results  Component Value Date   HDL 47 04/28/2021   HDL 47 12/15/2020   HDL 43 09/08/2020   Lab Results  Component Value Date   LDLCALC 56 04/28/2021   LDLCALC 64 12/15/2020   LDLCALC 64 09/08/2020   Lab Results  Component Value Date   TRIG 66 04/28/2021   TRIG 80 12/15/2020   TRIG 82 09/08/2020   Lab Results  Component Value Date   CHOLHDL 2.5 04/28/2021   CHOLHDL 2.7 12/15/2020   CHOLHDL 2.9 09/08/2020  No results found for: "LDLDIRECT" -Controlled -Current treatment: Atorvastatin '80mg'$  Appropriate, Effective, Safe, Accessible -Medications previously tried: N/A  -Current dietary patterns: "Tries to  eat healthy" -Current exercise habits: N/A -Educated on Cholesterol goals;  -Recommended to continue current medication  Atrial Fibrillation (Goal: prevent stroke and major bleeding) -Controlled -CHADSVASC: 5 -Current treatment: Rate control:  Metoprolol Succ '50mg'$  Appropriate, Effective, Safe, Accessible Anticoagulation: Only ASA due to fall risk ASA '81mg'$  Appropriate, Effective, Safe, Accessible -Medications previously tried: Clopidogrel -Home BP and HR readings: Didn't have readings  -Counseled on seeking medical attention after a head injury or if there is blood in the urine/stool; -Recommended to continue current medication   Osteoporosis / Osteopenia (Goal Prevent fractures) -Not ideally controlled -Last DEXA Scan: March 2019 (Unable to find results) -Patient is a candidate for pharmacologic treatment due to   -Current treatment  N/A -Medications previously tried: Alendronate (Started 2009-Stop 2023) -Recommend [phone] units of vitamin D daily. April 2023: Will ask PCP about Alendronate length of therapy November 2023: Will ask PCP for DEXA Scan  Thyroid (Goal TSH: 0.4-5.0) Lab Results  Component Value Date   TSH 1.710 04/28/2021  -Controlled -Current treatment: Levothyroxine 145mg Appropriate, Effective, Query Safe, Accessible -Counseled to take medication on an empty stomach April 2023: Recommend decreasing dose for a goal on the higher end of 0.4-5.0 due to osteo risk November 2023: Recommend higher goal. Coordinated with daughter to get patient scheduled for f/u and labs   Patient Goals/Self-Care Activities Patient will:  - take medications as prescribed as evidenced by patient report and record review  Follow Up Plan: The patient has been provided with contact information for the care management team and has  been advised to call with any health related questions or concerns.   CPP F/U prn  [name], Pharm.D. - [phone]       [name] was  given information about Chronic Care Management services today including:  CCM service includes personalized support from designated clinical staff supervised by his physician, including individualized plan of care and coordination with other care providers 24/7 contact phone numbers for assistance for urgent and routine care needs. Standard insurance, coinsurance, copays and deductibles apply for chronic care management only during months in which we provide at least 20 minutes of these services. Most insurances cover these services at 100%, however patients may be responsible for any copay, coinsurance and/or deductible if applicable. This service may help you avoid the need for more expensive face-to-face services. Only one practitioner may furnish and bill the service in a calendar month. The patient may stop CCM services at any time (effective at the end of the month) by phone call to the office staff.  Patient agreed to services and verbal consent obtained.   The patient verbalized understanding of instructions, educational materials, and care plan provided today and DECLINED offer to receive copy of patient instructions, educational materials, and care plan.  The pharmacy team will reach out to the patient again over the next 60 days.   [name], [place]

## 2022-04-07 DIAGNOSIS — I10 Essential (primary) hypertension: Secondary | ICD-10-CM

## 2022-04-07 DIAGNOSIS — E782 Mixed hyperlipidemia: Secondary | ICD-10-CM

## 2022-04-07 DIAGNOSIS — E039 Hypothyroidism, unspecified: Secondary | ICD-10-CM

## 2022-04-14 ENCOUNTER — Other Ambulatory Visit: Payer: Self-pay

## 2022-04-14 ENCOUNTER — Telehealth: Payer: Self-pay

## 2022-04-14 DIAGNOSIS — E039 Hypothyroidism, unspecified: Secondary | ICD-10-CM

## 2022-04-14 MED ORDER — LEVOTHYROXINE SODIUM 175 MCG PO TABS: 175.0000 ug | ORAL_TABLET | Freq: Every day | ORAL | 1 refills | Status: DC

## 2022-04-14 NOTE — Progress Notes (Signed)
    Chronic Care Management Pharmacy Assistant   Name: Markeith Jue Sr.  MRN: [id number] DOB: Oct 08, 1931   Reason for Encounter: Medication Coordination for [facility]    Recent office visits:  None  Recent consult visits:  None  Hospital visits:  None  Medications: Outpatient Encounter Medications as of 04/14/2022  Medication Sig   acetaminophen (TYLENOL) 325 MG tablet Take 2 tablets (650 mg total) by mouth every 6 (six) hours as needed. (Patient taking differently: Take 650 mg by mouth every 6 (six) hours as needed for mild pain or moderate pain.)   albuterol (VENTOLIN HFA) 108 (90 Base) MCG/ACT inhaler Inhale 2 puffs into the lungs every 6 (six) hours as needed for wheezing or shortness of breath.   aspirin 81 MG EC tablet Take 1 tablet (81 mg total) by mouth daily. Swallow whole.   atorvastatin (LIPITOR) 80 MG tablet Take 1 tablet (80 mg total) by mouth daily.   ferrous sulfate (FERROUSUL) 325 (65 FE) MG tablet Take 1 tablet (325 mg total) by mouth daily with breakfast.   ipratropium-albuterol (DUONEB) 0.5-2.5 (3) MG/3ML SOLN Take 3 mLs by nebulization every 4 (four) hours as needed (shortness of breath or wheezing).   levothyroxine (SYNTHROID) 175 MCG tablet Take 1 tablet (175 mcg total) by mouth daily before breakfast.   metoprolol succinate (TOPROL-XL) 50 MG 24 hr tablet Take 1 tablet (50 mg total) by mouth daily. TAKE WITH OR IMMEDIATELY FOLLOWING A MEAL.   Multiple Vitamins-Minerals (MULTIVITAMIN ADULT PO) Take 1 tablet by mouth daily. Unknown strength   Nutritional Supplements (BOOST NUTRITIONAL ENERGY PO) Take 237 mLs by mouth in the morning and at bedtime. Drinking 1-2 per day   triamcinolone (KENALOG) 0.1 % Apply 1 application topically 2 (two) times daily as needed (rash).   No facility-administered encounter medications on file as of 04/14/2022.    Reviewed chart for medication changes ahead of medication coordination call.  No OVs, Consults, or hospital visits  since last care coordination call/Pharmacist visit.   No medication changes indicated OR if recent visit, treatment plan here.  BP Readings from Last 3 Encounters:  03/30/22 126/74  02/21/22 116/64  12/27/21 120/70    Lab Results  Component Value Date   HGBA1C 6.3 (H) 06/22/2020     Patient obtains medications through Adherence Packaging  30 Days   Last adherence delivery included:  Levothyroxine 164mg 1 BB Atorvastatin '80mg'$  1 B Aspirin '81mg'$  1 B Metoprolol Succ ER '50mg'$  1 B Ferrous Sulfate '325mg'$  1 B Vitamin B12 10065m 1 B Fexofenadine '180mg'$  1 B  Patient declined (meds) last month  Albuterol Inhaler- Pt has enough supply  Ipratropium-Albuterol Nebulizer- plenty supply Kenelog cream- not using Tylenol- OTC Multivitamin- OTC  Patient is due for next adherence delivery on: 04/26/22. Called patient and reviewed medications and coordinated delivery.  This delivery to include: Levothyroxine 17565m1 BB Atorvastatin '80mg'$  1 B Aspirin '81mg'$  1 B Metoprolol Succ ER '50mg'$  1 B Ferrous Sulfate '325mg'$  1 B Vitamin B12 1000m57m B Fexofenadine '180mg'$  1 B  Patient declined the following medications  Albuterol Inhaler- Pt has enough supply  Ipratropium-Albuterol Nebulizer- plenty supply Kenelog cream- not using Tylenol- OTC Multivitamin- OTC  Patient needs refills-Request Sent  Levothyroxine 175mc40mConfirmed delivery date of 04/26/22, advised patient that pharmacy will contact them the morning of delivery.   DanieElray Mcgregor CDurhammacist Assistant  [phone]

## 2022-05-13 ENCOUNTER — Telehealth: Payer: Self-pay

## 2022-05-13 NOTE — Progress Notes (Signed)
Care Management & Coordination Services Pharmacy Team  Reason for Encounter: Medication coordination and delivery  Contacted patient on 05/13/2022 to discuss medications   Recent office visits:  None  Recent consult visits:  None  Hospital visits:  None  Medications: Outpatient Encounter Medications as of 05/13/2022  Medication Sig   acetaminophen (TYLENOL) 325 MG tablet Take 2 tablets (650 mg total) by mouth every 6 (six) hours as needed. (Patient taking differently: Take 650 mg by mouth every 6 (six) hours as needed for mild pain or moderate pain.)   albuterol (VENTOLIN HFA) 108 (90 Base) MCG/ACT inhaler Inhale 2 puffs into the lungs every 6 (six) hours as needed for wheezing or shortness of breath.   aspirin 81 MG EC tablet Take 1 tablet (81 mg total) by mouth daily. Swallow whole.   atorvastatin (LIPITOR) 80 MG tablet Take 1 tablet (80 mg total) by mouth daily.   ferrous sulfate (FERROUSUL) 325 (65 FE) MG tablet Take 1 tablet (325 mg total) by mouth daily with breakfast.   ipratropium-albuterol (DUONEB) 0.5-2.5 (3) MG/3ML SOLN Take 3 mLs by nebulization every 4 (four) hours as needed (shortness of breath or wheezing).   levothyroxine (SYNTHROID) 175 MCG tablet Take 1 tablet (175 mcg total) by mouth daily before breakfast.   metoprolol succinate (TOPROL-XL) 50 MG 24 hr tablet Take 1 tablet (50 mg total) by mouth daily. TAKE WITH OR IMMEDIATELY FOLLOWING A MEAL.   Multiple Vitamins-Minerals (MULTIVITAMIN ADULT PO) Take 1 tablet by mouth daily. Unknown strength   Nutritional Supplements (BOOST NUTRITIONAL ENERGY PO) Take 237 mLs by mouth in the morning and at bedtime. Drinking 1-2 per day   triamcinolone (KENALOG) 0.1 % Apply 1 application topically 2 (two) times daily as needed (rash).   No facility-administered encounter medications on file as of 05/13/2022.   BP Readings from Last 3 Encounters:  03/30/22 126/74  02/21/22 116/64  12/27/21 120/70    Pulse Readings from Last 3  Encounters:  03/30/22 66  02/21/22 61  12/27/21 (!) 46    Lab Results  Component Value Date/Time   HGBA1C 6.3 (H) 06/22/2020 05:13 AM   Lab Results  Component Value Date   CREATININE 0.76 10/13/2021   BUN 18 10/13/2021   GFRNONAA 78 06/29/2020   GFRAA 90 06/29/2020   NA 137 10/13/2021   K 4.8 10/13/2021   CALCIUM 9.1 10/13/2021   CO2 23 10/13/2021     Last adherence delivery date:04/26/22      Patient is due for next adherence delivery on: 05/25/22  Multiple attempts made to reach patient. Unsuccessful outreach. Will refill based off of last adherence fill.   This delivery to include: Adherence Packaging  30 Days  Levothyroxine 147mg 1 BB Atorvastatin '80mg'$  1 B Aspirin '81mg'$  1 B Metoprolol Succ ER '50mg'$  1 B Ferrous Sulfate '325mg'$  1 B Vitamin B12 10016m 1 B Fexofenadine '180mg'$  1 B  Patient declined the following medications this month: Unable to speak to pt  Refills requested from providers include: Levothyroxine 17579m Delivery scheduled for 05/25/22. Unable to speak with patient to confirm date.   Any concerns about your medications?   How often do you forget or accidentally miss a dose?   Do you use a pillbox? No  Is patient in packaging Yes  If yes  What is the date on your next pill pack?  Any concerns or issues with your packaging?   Recent blood pressure readings are as follows:02/21/22 116/64  Recent blood glucose readings are as  follows:None   Cycle dispensing form sent to [facility] for review.   Ovidio Hanger Gerringer

## 2022-05-17 ENCOUNTER — Other Ambulatory Visit: Payer: Self-pay

## 2022-05-17 DIAGNOSIS — E039 Hypothyroidism, unspecified: Secondary | ICD-10-CM

## 2022-05-17 MED ORDER — LEVOTHYROXINE SODIUM 175 MCG PO TABS: 175.0000 ug | ORAL_TABLET | Freq: Every day | ORAL | 0 refills | Status: DC

## 2022-05-17 NOTE — Telephone Encounter (Signed)
-----   Message from Eulis Canner sent at 05/17/2022  2:10 PM EST ----- Regarding: refills Good afternoon,  We will need a refill sent to [facility] on the following: Levothyroxine 12mg   Thank you  DElray Mcgregor CPeruPharmacist Assistant  [phone]

## 2022-05-17 NOTE — Telephone Encounter (Signed)
Patient needs an appointment fasting. Dr. Tobie Poet

## 2022-06-14 ENCOUNTER — Telehealth: Payer: Self-pay

## 2022-06-14 NOTE — Progress Notes (Unsigned)
Care Management & Coordination Services Pharmacy Team  Reason for Encounter: Medication coordination and delivery  Contacted patient to discuss medications and coordinate delivery from [facility]. {US HC Outreach:28874} Cycle dispensing form sent to *** for review.   Last adherence delivery date:05/25/22    Patient is due for next adherence delivery on: 06/24/22  This delivery to include: Adherence Packaging  30 Days  Levothyroxine 185mg 1 BB Atorvastatin '80mg'$  1 B Aspirin '81mg'$  1 B Metoprolol Succ ER '50mg'$  1 B Ferrous Sulfate '325mg'$  1 B Vitamin B12 10035m 1 B Fexofenadine '180mg'$  1 B Albuterol Inhaler 90 mcg  2 puffs every 6 hours as needed   Patient declined the following medications this month: None  Refills requested from providers include: Levothyroxine 17568m  {Delivery date:25786}   Any concerns about your medications? {yes/no:20286}  How often do you forget or accidentally miss a dose? {Missed doses:25554}  Do you use a pillbox? No  Is patient in packaging Yes  If yes  What is the date on your next pill pack?  Any concerns or issues with your packaging?   Recent blood pressure readings are as follows:02/21/22 116/64   Chart review: Recent office visits:  None  Recent consult visits:  None  Hospital visits:  None  Medications: Outpatient Encounter Medications as of 06/14/2022  Medication Sig   acetaminophen (TYLENOL) 325 MG tablet Take 2 tablets (650 mg total) by mouth every 6 (six) hours as needed. (Patient taking differently: Take 650 mg by mouth every 6 (six) hours as needed for mild pain or moderate pain.)   albuterol (VENTOLIN HFA) 108 (90 Base) MCG/ACT inhaler Inhale 2 puffs into the lungs every 6 (six) hours as needed for wheezing or shortness of breath.   aspirin 81 MG EC tablet Take 1 tablet (81 mg total) by mouth daily. Swallow whole.   atorvastatin (LIPITOR) 80 MG tablet Take 1 tablet (80 mg total) by mouth daily.   ferrous sulfate  (FERROUSUL) 325 (65 FE) MG tablet Take 1 tablet (325 mg total) by mouth daily with breakfast.   ipratropium-albuterol (DUONEB) 0.5-2.5 (3) MG/3ML SOLN Take 3 mLs by nebulization every 4 (four) hours as needed (shortness of breath or wheezing).   levothyroxine (SYNTHROID) 175 MCG tablet Take 1 tablet (175 mcg total) by mouth daily before breakfast.   metoprolol succinate (TOPROL-XL) 50 MG 24 hr tablet Take 1 tablet (50 mg total) by mouth daily. TAKE WITH OR IMMEDIATELY FOLLOWING A MEAL.   Multiple Vitamins-Minerals (MULTIVITAMIN ADULT PO) Take 1 tablet by mouth daily. Unknown strength   Nutritional Supplements (BOOST NUTRITIONAL ENERGY PO) Take 237 mLs by mouth in the morning and at bedtime. Drinking 1-2 per day   triamcinolone (KENALOG) 0.1 % Apply 1 application topically 2 (two) times daily as needed (rash).   No facility-administered encounter medications on file as of 06/14/2022.   BP Readings from Last 3 Encounters:  03/30/22 126/74  02/21/22 116/64  12/27/21 120/70    Pulse Readings from Last 3 Encounters:  03/30/22 66  02/21/22 61  12/27/21 (!) 46    Lab Results  Component Value Date/Time   HGBA1C 6.3 (H) 06/22/2020 05:13 AM   Lab Results  Component Value Date   CREATININE 0.76 10/13/2021   BUN 18 10/13/2021   GFRNONAA 78 06/29/2020   GFRAA 90 06/29/2020   NA 137 10/13/2021   K 4.8 10/13/2021   CALCIUM 9.1 10/13/2021   CO2 23 10/13/2021     DanElray McgregorMASchleyinical Pharmacist Assistant  [phone]  

## 2022-06-17 ENCOUNTER — Other Ambulatory Visit: Payer: Self-pay | Admitting: Family Medicine

## 2022-06-17 DIAGNOSIS — E039 Hypothyroidism, unspecified: Secondary | ICD-10-CM

## 2022-06-30 ENCOUNTER — Encounter: Payer: Self-pay | Admitting: Cardiology

## 2022-06-30 ENCOUNTER — Ambulatory Visit: Payer: Medicare HMO | Attending: Cardiology | Admitting: Cardiology

## 2022-06-30 ENCOUNTER — Telehealth: Payer: Self-pay

## 2022-06-30 VITALS — BP 144/80 | HR 72 | Ht 72.0 in | Wt 138.0 lb

## 2022-06-30 DIAGNOSIS — I493 Ventricular premature depolarization: Secondary | ICD-10-CM | POA: Diagnosis not present

## 2022-06-30 DIAGNOSIS — R42 Dizziness and giddiness: Secondary | ICD-10-CM | POA: Diagnosis not present

## 2022-06-30 DIAGNOSIS — J449 Chronic obstructive pulmonary disease, unspecified: Secondary | ICD-10-CM

## 2022-06-30 DIAGNOSIS — I2510 Atherosclerotic heart disease of native coronary artery without angina pectoris: Secondary | ICD-10-CM

## 2022-06-30 DIAGNOSIS — R0609 Other forms of dyspnea: Secondary | ICD-10-CM | POA: Diagnosis not present

## 2022-06-30 DIAGNOSIS — I4729 Other ventricular tachycardia: Secondary | ICD-10-CM

## 2022-06-30 DIAGNOSIS — I480 Paroxysmal atrial fibrillation: Secondary | ICD-10-CM

## 2022-06-30 NOTE — Telephone Encounter (Signed)
Scheduled the patient for Watchman consult with Dr. Quentin Ore 08/03/2022. His daughter was grateful for call and agreed with plan.

## 2022-06-30 NOTE — Patient Instructions (Addendum)
Medication Instructions:  Your physician recommends that you continue on your current medications as directed. Please refer to the Current Medication list given to you today.  *If you need a refill on your cardiac medications before your next appointment, please call your pharmacy*   Lab Work: BMP, ProBNP- today If you have labs (blood work) drawn today and your tests are completely normal, you will receive your results only by: [place] (if you have MyChart) OR A paper copy in the mail If you have any lab test that is abnormal or we need to change your treatment, we will call you to review the results.   Testing/Procedures: None Ordered   Follow-Up: At [facility], you and your health needs are our priority.  As part of our continuing mission to provide you with exceptional heart care, we have created designated Provider Care Teams.  These Care Teams include your primary Cardiologist (physician) and Advanced Practice Providers (APPs -  Physician Assistants and Nurse Practitioners) who all work together to provide you with the care you need, when you need it.  We recommend signing up for the patient portal called "MyChart".  Sign up information is provided on this After Visit Summary.  MyChart is used to connect with patients for Virtual Visits (Telemedicine).  Patients are able to view lab/test results, encounter notes, upcoming appointments, etc.  Non-urgent messages can be sent to your provider as well.   To learn more about what you can do with MyChart, go to [email or web address].    Your next appointment:   3-4 month(s)  The format for your next appointment:   In Person  Provider:   [name], MD    Other Instructions NA

## 2022-06-30 NOTE — Telephone Encounter (Signed)
Per Dr. Agustin Cree, called to arrange Watchman consult with Dr. Quentin Ore.  Left message to call back.
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
## 2022-07-01 LAB — BASIC METABOLIC PANEL
BUN/Creatinine Ratio: 22 (ref 10–24)
BUN: 18 mg/dL (ref 8–27)
CO2: 23 mmol/L (ref 20–29)
Calcium: 9.8 mg/dL (ref 8.6–10.2)
Chloride: 103 mmol/L (ref 96–106)
Creatinine, Ser: 0.81 mg/dL (ref 0.76–1.27)
Glucose: 95 mg/dL (ref 70–99)
Potassium: 4.6 mmol/L (ref 3.5–5.2)
Sodium: 139 mmol/L (ref 134–144)
eGFR: 85 mL/min/{1.73_m2} (ref >59)

## 2022-07-01 LAB — PRO B NATRIURETIC PEPTIDE: NT-Pro BNP: 810 pg/mL — ABNORMAL HIGH (ref 0–486)

## 2022-07-04 ENCOUNTER — Telehealth: Payer: Self-pay | Admitting: Cardiology

## 2022-07-04 ENCOUNTER — Telehealth: Payer: Self-pay

## 2022-07-04 NOTE — Telephone Encounter (Signed)
Results reviewed with pt's daughter, Wellington Hampshire- per [facility] as per Dr. Wendy Poet note. Daughter verbalized understanding and had no additional questions. Routed to PCP

## 2022-07-04 NOTE — Telephone Encounter (Signed)
Daughter calling bout patient labs. Please advise

## 2022-07-05 ENCOUNTER — Telehealth: Payer: Self-pay

## 2022-07-05 NOTE — Telephone Encounter (Signed)
Welcome. to schedule their annual wellness visit. Patient declined to schedule AWV at this time.  Daughter states they will call back at a later date to schedule.  Norton Blizzard, [place] ([facility])  [facility] [phone]

## 2022-07-05 NOTE — Telephone Encounter (Signed)
Called patient to schedule Medicare Annual Wellness Visit (AWV). Left message for patient to call back and schedule an appointment. Please schedule him for a Medicare Annual Wellness Visit (AWV).   Last date of AWV: 01/29/2021    Please schedule an appointment at any time with Shelle Iron, LPN or Lemmie Evens, [place].

## 2022-07-08 ENCOUNTER — Ambulatory Visit (INDEPENDENT_AMBULATORY_CARE_PROVIDER_SITE_OTHER): Payer: Medicare HMO | Admitting: Family Medicine

## 2022-07-08 ENCOUNTER — Encounter: Payer: Self-pay | Admitting: Family Medicine

## 2022-07-08 VITALS — BP 112/62 | HR 80 | Temp 97.2°F | Ht 72.0 in | Wt 138.0 lb

## 2022-07-08 DIAGNOSIS — D530 Protein deficiency anemia: Secondary | ICD-10-CM

## 2022-07-08 DIAGNOSIS — I480 Paroxysmal atrial fibrillation: Secondary | ICD-10-CM | POA: Diagnosis not present

## 2022-07-08 DIAGNOSIS — J4540 Moderate persistent asthma, uncomplicated: Secondary | ICD-10-CM

## 2022-07-08 DIAGNOSIS — I25110 Atherosclerotic heart disease of native coronary artery with unstable angina pectoris: Secondary | ICD-10-CM | POA: Diagnosis not present

## 2022-07-08 DIAGNOSIS — E039 Hypothyroidism, unspecified: Secondary | ICD-10-CM | POA: Diagnosis not present

## 2022-07-08 DIAGNOSIS — J410 Simple chronic bronchitis: Secondary | ICD-10-CM | POA: Diagnosis not present

## 2022-07-08 DIAGNOSIS — E782 Mixed hyperlipidemia: Secondary | ICD-10-CM

## 2022-07-08 DIAGNOSIS — I10 Essential (primary) hypertension: Secondary | ICD-10-CM

## 2022-07-08 NOTE — Progress Notes (Unsigned)
Subjective:  Patient ID: James Iha Sr., male    DOB: 07/23/1931  Age: 89 y.o. MRN: [id number]  Chief Complaint  Patient presents with   Hyperlipidemia   Hypertension    HPI Hyperlipidemia:  taking lipitor 80 mg daily  Hypothyroidism: synthroid 175 mcg daily  Hypertensive heart disease: patient has history of cad.  taking toprol xl 50 mg daily and aspirin 81 mg daily. Hx of MI 2 years ago had a stent placed. Dr. Raliegh Ip rec patient see Dr. Quentin Ore due to possible A-fib   Anemia: iron sulfate 325 mg daily and b12 1000 mcg once daily.  Asthma, moderate persistent: Albuterol or duoneb nebs if hot and muggy outside.     07/08/2022   11:08 AM 01/29/2021    1:14 PM 12/15/2020   10:03 AM 09/04/2019   11:25 AM 08/15/2019    9:36 AM  Depression screen PHQ 2/9  Decreased Interest 0 0 0 0 0  Down, Depressed, Hopeless 0 0 0 0 0  PHQ - 2 Score 0 0 0 0 0         06/24/2020    8:20 AM 06/25/2020    8:51 AM 12/15/2020   10:01 AM 01/29/2021    1:18 PM 07/08/2022   11:08 AM  Fall Risk  Falls in the past year?   0 1 0  Was there an injury with Fall?   0 1 0  Was there an injury with Fall? - Comments    broke wrist in March 2021, broke his elbow in Nov 2021   Fall Risk Category Calculator   0 2 0  Fall Risk Category (Retired)   Low Moderate   (RETIRED) Patient Fall Risk Level High fall risk High fall risk Moderate fall risk Moderate fall risk   Patient at Risk for Falls Due to   Impaired balance/gait History of fall(s) No Fall Risks  Fall risk Follow up   Falls prevention discussed Education provided Falls evaluation completed      Review of Systems  Constitutional:  Negative for chills, diaphoresis, fatigue and fever.  HENT:  Negative for congestion, ear pain and sore throat.   Respiratory:  Negative for cough and shortness of breath.   Cardiovascular:  Negative for chest pain and leg swelling.  Gastrointestinal:  Negative for abdominal pain, constipation, diarrhea, nausea and vomiting.   Genitourinary:  Negative for dysuria and urgency.  Musculoskeletal:  Negative for arthralgias and myalgias.  Neurological:  Negative for dizziness and headaches.  Psychiatric/Behavioral:  Negative for dysphoric mood.     Current Outpatient Medications on File Prior to Visit  Medication Sig Dispense Refill   acetaminophen (TYLENOL) 325 MG tablet Take 2 tablets (650 mg total) by mouth every 6 (six) hours as needed. (Patient taking differently: Take 650 mg by mouth every 6 (six) hours as needed for mild pain or moderate pain.) 30 tablet 1   albuterol (VENTOLIN HFA) 108 (90 Base) MCG/ACT inhaler Inhale 2 puffs into the lungs every 6 (six) hours as needed for wheezing or shortness of breath. 18 each 12   aspirin 81 MG EC tablet Take 1 tablet (81 mg total) by mouth daily. Swallow whole. 90 tablet 3   atorvastatin (LIPITOR) 80 MG tablet Take 1 tablet (80 mg total) by mouth daily. 90 tablet 1   ferrous sulfate (FERROUSUL) 325 (65 FE) MG tablet Take 1 tablet (325 mg total) by mouth daily with breakfast. 30 tablet 12   ipratropium-albuterol (DUONEB) 0.5-2.5 (3) MG/3ML SOLN  Take 3 mLs by nebulization every 4 (four) hours as needed (shortness of breath or wheezing). 360 mL 2   levothyroxine (SYNTHROID) 175 MCG tablet TAKE ONE TABLET BY MOUTH BEFORE BREAKFAST 90 tablet 1   metoprolol succinate (TOPROL-XL) 50 MG 24 hr tablet Take 1 tablet (50 mg total) by mouth daily. TAKE WITH OR IMMEDIATELY FOLLOWING A MEAL. 90 tablet 3   Multiple Vitamins-Minerals (MULTIVITAMIN ADULT PO) Take 1 tablet by mouth daily. Unknown strength     Nutritional Supplements (BOOST NUTRITIONAL ENERGY PO) Take 237 mLs by mouth in the morning and at bedtime. Drinking 1-2 per day     triamcinolone (KENALOG) 0.1 % Apply 1 application topically 2 (two) times daily as needed (rash).     No current facility-administered medications on file prior to visit.   Past Medical History:  Diagnosis Date   Acquired hypothyroidism [id number]   Acute  systolic heart failure ([place]) 06/25/2020   Anemia 02/05/2020   boarderline anemic   Atopic dermatitis 12/05/2019   Bacterial pneumonia, unspecified 06/14/2012   06/14/2012 CXR w/ RML PNA >Zithromax and Rocephin     Bradycardia 11/28/2018   CAD (coronary artery disease) 07/07/2020   Cancer ([place])    prostate cancer-seed implant   Closed fracture dislocation of right elbow    Closed fracture of right olecranon process 04/10/2020   COPD (chronic obstructive pulmonary disease) ([place]) 01/21/2020   Dermatitis 02/05/2020   Dizziness 02/19/2016   Essential hypertension 11/28/2018   History of prostate cancer    seed implant   Hyperlipemia    Hyperlipidemia, mixed 08/21/2008   Qualifier: Diagnosis of  By: Annamaria Boots MD, Clinton D    LVH (left ventricular hypertrophy) 11/28/2018   Malnutrition of moderate degree ([place]) 08/15/2019   Mixed conductive and sensorineural hearing loss, 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   Moderate persistent asthma, uncomplicated [id number]   Nonsustained ventricular tachycardia ([place]) 01/30/2019   NSTEMI (non-ST elevated myocardial infarction) ([place]) 06/21/2020   Other fatigue 02/05/2020   PAF (paroxysmal atrial fibrillation) ([place]) 06/25/2020   Seasonal and perennial allergic rhinitis 05/17/2007       Senile osteoporosis 08/15/2019   SINUSITIS, ACUTE 06/28/2007   Qualifier: Diagnosis of  By: Royal Piedra NP, Tammy     Ventricular ectopy 11/28/2018   Past Surgical History:  Procedure Laterality Date   APPENDECTOMY     CORONARY ATHERECTOMY N/A 06/24/2020   Procedure: CORONARY ATHERECTOMY;  Surgeon: Wellington Hampshire, MD;  Location: [facility];  Service: Cardiovascular;  Laterality: N/A;   INTRAVASCULAR ULTRASOUND/IVUS N/A 06/24/2020   Procedure: Intravascular Ultrasound/IVUS;  Surgeon: Wellington Hampshire, MD;  Location: [facility];  Service: Cardiovascular;  Laterality: N/A;   LEFT HEART CATH AND CORONARY ANGIOGRAPHY N/A 06/22/2020   Procedure: LEFT HEART CATH AND CORONARY ANGIOGRAPHY;  Surgeon: Nelva Bush, MD;  Location: [facility];  Service: Cardiovascular;  Laterality: N/A;   ORIF ELBOW FRACTURE Right 04/10/2020   Procedure: OPEN REDUCTION INTERNAL FIXATION (ORIF) ELBOW/OLECRANON FRACTURE;  Surgeon: Marchia Bond, MD;  Location: [facility];  Service: Orthopedics;  Laterality: Right;    Family History  Problem Relation Age of Onset   Alzheimer's disease Father    Diabetes Mother    Social History   Socioeconomic History   Marital status: Married    Spouse name: Not on file   Number of children: Not on file   Years of education: Not on file   Highest education level: Not on file  Occupational History   Occupation: Retired  Employer: OTHER    Comment: [employer]  Tobacco Use   Smoking status: Former    Types: Cigarettes    Quit date: 1960    Years since quitting: 64.2   Smokeless tobacco: Former    Types: Chew    Quit date: 2011  Vaping Use   Vaping Use: Never used  Substance and Sexual Activity   Alcohol use: No   Drug use: No   Sexual activity: Not Currently  Other Topics Concern   Not on file  Social History Narrative   Not on file   Social Determinants of Health   Financial Resource Strain: Low Risk  (03/30/2022)   Overall Financial Resource Strain (CARDIA)    Difficulty of Paying Living Expenses: Not hard at all  Food Insecurity: No Food Insecurity (01/29/2021)   Hunger Vital Sign    Worried About Running Out of Food in the Last Year: Never true    [place] in the Last Year: Never true  Transportation Needs: No Transportation Needs (03/30/2022)   PRAPARE - Hydrologist (Medical): No    Lack of Transportation (Non-Medical): No  Physical Activity: Insufficiently Active (01/29/2021)   Exercise Vital Sign    Days of Exercise per Week: 7 days    Minutes of Exercise per Session: 10 min  Stress: No Stress Concern Present (01/29/2021)       Feeling of Stress : Not at all  Social Connections: [place] (01/29/2021)   Social Connection and Isolation Panel [NHANES]    Frequency of Communication with Friends and Family: Three times a week    Frequency of Social Gatherings with Friends and Family: Three times a week    Attends Religious Services: 1 to 4 times per year    Active Member of Clubs or Organizations: Yes    Attends Archivist Meetings: 1 to 4 times per year    Marital Status: Married    Objective:  BP 112/62   Pulse 80   Temp (!) 97.2 F (36.2 C)   Ht 6' (1.829 m)   Wt 138 lb (62.6 kg)   SpO2 98%   BMI 18.72 kg/m      07/08/2022   11:06 AM 06/30/2022   11:29 AM 03/30/2022    2:00 PM  BP/Weight  Systolic BP [id number] [id number] [id number]  Diastolic BP 62 80 74  Wt. (Lbs) 138 138   BMI 18.72 kg/m2 18.72 kg/m2     Physical Exam Vitals reviewed.  Constitutional:      Appearance: Normal appearance. He is normal weight.  Cardiovascular:     Rate and Rhythm: Normal rate and regular rhythm.     Heart sounds: No murmur heard. Pulmonary:     Effort: Pulmonary effort is normal.     Breath sounds: Normal breath sounds.  Abdominal:     General: Abdomen is flat. Bowel sounds are normal.     Palpations: Abdomen is soft.     Tenderness: There is no abdominal tenderness.  Neurological:     Mental Status: He is alert and oriented to person, place, and time.  Psychiatric:        Mood and Affect: Mood normal.        Behavior: Behavior normal.     Diabetic Foot Exam - Simple   No data filed      Lab Results  Component Value Date   WBC 8.9  10/13/2021   HGB 11.0 (L) 10/13/2021   HCT 33.5 (L) 10/13/2021   PLT 225 10/13/2021   GLUCOSE 95 06/30/2022   CHOL 117 04/28/2021   TRIG 66 04/28/2021   HDL 47 04/28/2021   LDLCALC 56 04/28/2021   ALT 17 10/13/2021   AST 21 10/13/2021   NA 139 06/30/2022   K 4.6 06/30/2022   CL 103 06/30/2022   CREATININE 0.81 06/30/2022    BUN 18 06/30/2022   CO2 23 06/30/2022   TSH 1.710 04/28/2021   HGBA1C 6.3 (H) 06/22/2020      Assessment & Plan:    There are no diagnoses linked to this encounter.   No orders of the defined types were placed in this encounter.   No orders of the defined types were placed in this encounter.    Follow-up: Return for chronic.   I,Katherina A Bramblett,acting as a scribe for Rochel Brome, MD.,have documented all relevant documentation on the behalf of Rochel Brome, MD,as directed by  Rochel Brome, MD while in the presence of Rochel Brome, MD.   Geralynn Ochs I Leal-Borjas,acting as a scribe for Rochel Brome, MD.,have documented all relevant documentation on the behalf of Rochel Brome, MD,as directed by  Rochel Brome, MD while in the presence of Rochel Brome, MD.     An After Visit Summary was printed and given to the patient.  Rochel Brome, MD [facility] [phone]

## 2022-07-09 NOTE — Assessment & Plan Note (Signed)
Well controlled.  No changes to medicines. Atorvastatin 80 mg daily. Continue to work on eating a healthy diet and exercise.  Labs drawn today.

## 2022-07-09 NOTE — Assessment & Plan Note (Signed)
Management per specialist. 

## 2022-07-09 NOTE — Assessment & Plan Note (Signed)
Well controlled.  No changes to medicines. Taking Metoprolol 50 mg daily, aspirin 81 mg daily. Continue to work on eating a healthy diet and exercise.  Labs drawn today.

## 2022-07-09 NOTE — Assessment & Plan Note (Signed)
Check labs 

## 2022-07-09 NOTE — Assessment & Plan Note (Signed)
Management per specialist. Continue with aspirin 81 mg daily, Atorvastatin 80 mg daily.

## 2022-07-09 NOTE — Assessment & Plan Note (Signed)
Previously well controlled Continue Synthroid at current dose  Recheck TSH and adjust Synthroid as indicated   

## 2022-07-10 NOTE — Assessment & Plan Note (Signed)
Management per specialist. 

## 2022-07-13 ENCOUNTER — Telehealth: Payer: Self-pay

## 2022-07-13 DIAGNOSIS — I700 Atherosclerosis of aorta: Secondary | ICD-10-CM | POA: Diagnosis not present

## 2022-07-13 DIAGNOSIS — I252 Old myocardial infarction: Secondary | ICD-10-CM | POA: Diagnosis not present

## 2022-07-13 DIAGNOSIS — Z8249 Family history of ischemic heart disease and other diseases of the circulatory system: Secondary | ICD-10-CM | POA: Diagnosis not present

## 2022-07-13 DIAGNOSIS — E785 Hyperlipidemia, unspecified: Secondary | ICD-10-CM | POA: Diagnosis not present

## 2022-07-13 DIAGNOSIS — I2510 Atherosclerotic heart disease of native coronary artery without angina pectoris: Secondary | ICD-10-CM | POA: Diagnosis not present

## 2022-07-13 DIAGNOSIS — I4891 Unspecified atrial fibrillation: Secondary | ICD-10-CM | POA: Diagnosis not present

## 2022-07-13 DIAGNOSIS — J45909 Unspecified asthma, uncomplicated: Secondary | ICD-10-CM | POA: Diagnosis not present

## 2022-07-13 DIAGNOSIS — M1990 Unspecified osteoarthritis, unspecified site: Secondary | ICD-10-CM | POA: Diagnosis not present

## 2022-07-13 DIAGNOSIS — G3184 Mild cognitive impairment, so stated: Secondary | ICD-10-CM | POA: Diagnosis not present

## 2022-07-13 DIAGNOSIS — I10 Essential (primary) hypertension: Secondary | ICD-10-CM | POA: Diagnosis not present

## 2022-07-13 DIAGNOSIS — D6869 Other thrombophilia: Secondary | ICD-10-CM | POA: Diagnosis not present

## 2022-07-13 DIAGNOSIS — E039 Hypothyroidism, unspecified: Secondary | ICD-10-CM | POA: Diagnosis not present

## 2022-07-13 NOTE — Progress Notes (Signed)
Care Management & Coordination Services Pharmacy Team  Reason for Encounter: Medication coordination and delivery  Contacted patient to discuss medications and coordinate delivery from [facility]. Spoke with family on 07/13/2022   [facility] dispensing form sent to [name] for review.   Last adherence delivery date:06/24/22      Patient is due for next adherence delivery on: 07/25/22  This delivery to include: Adherence Packaging  30 Days  Levothyroxine 170mg 1 BB Atorvastatin '80mg'$  1 B Aspirin '81mg'$  1 B Metoprolol Succ ER '50mg'$  1 B Ferrous Sulfate '325mg'$  1 B Vitamin B12 10087m 1 B Fexofenadine '180mg'$  1 B Albuterol Inhaler 90 mcg  2 puffs every 6 hours as needed    Patient declined the following medications this month: None  Refills requested from providers include: Levothyroxine 17542m  Confirmed delivery date of 07/25/22, advised patient that pharmacy will contact them the morning of delivery.  Any concerns about your medications? No  How often do you forget or accidentally miss a dose? Never  Do you use a pillbox? No  Is patient in packaging Yes   Recent blood pressure readings are as follows:07/08/22 112/62  Chart review: Recent office visits:  07/08/22 [facility]. Seen for routine visit. No med changes.   Recent consult visits:  06/30/22 (Cardiology) [facility]. Seen for routine visit. No med changes.  Hospital visits:  None  Medications: Outpatient Encounter Medications as of 07/13/2022  Medication Sig   acetaminophen (TYLENOL) 325 MG tablet Take 2 tablets (650 mg total) by mouth every 6 (six) hours as needed. (Patient taking differently: Take 650 mg by mouth every 6 (six) hours as needed for mild pain or moderate pain.)   albuterol (VENTOLIN HFA) 108 (90 Base) MCG/ACT inhaler Inhale 2 puffs into the lungs every 6 (six) hours as needed for wheezing or shortness of breath.   aspirin 81 MG EC tablet Take 1 tablet (81 mg total) by mouth daily.  Swallow whole.   atorvastatin (LIPITOR) 80 MG tablet Take 1 tablet (80 mg total) by mouth daily.   ferrous sulfate (FERROUSUL) 325 (65 FE) MG tablet Take 1 tablet (325 mg total) by mouth daily with breakfast.   ipratropium-albuterol (DUONEB) 0.5-2.5 (3) MG/3ML SOLN Take 3 mLs by nebulization every 4 (four) hours as needed (shortness of breath or wheezing).   levothyroxine (SYNTHROID) 175 MCG tablet TAKE ONE TABLET BY MOUTH BEFORE BREAKFAST   metoprolol succinate (TOPROL-XL) 50 MG 24 hr tablet Take 1 tablet (50 mg total) by mouth daily. TAKE WITH OR IMMEDIATELY FOLLOWING A MEAL.   Multiple Vitamins-Minerals (MULTIVITAMIN ADULT PO) Take 1 tablet by mouth daily. Unknown strength   Nutritional Supplements (BOOST NUTRITIONAL ENERGY PO) Take 237 mLs by mouth in the morning and at bedtime. Drinking 1-2 per day   triamcinolone (KENALOG) 0.1 % Apply 1 application topically 2 (two) times daily as needed (rash).   No facility-administered encounter medications on file as of 07/13/2022.   BP Readings from Last 3 Encounters:  07/08/22 112/62  06/30/22 (!) 144/80  03/30/22 126/74    Pulse Readings from Last 3 Encounters:  07/08/22 80  06/30/22 72  03/30/22 66    Lab Results  Component Value Date/Time   HGBA1C 6.3 (H) 06/22/2020 05:13 AM   Lab Results  Component Value Date   CREATININE 0.81 06/30/2022   BUN 18 06/30/2022   GFRNONAA 78 06/29/2020   GFRAA 90 06/29/2020   NA 139 06/30/2022   K 4.6 06/30/2022   CALCIUM 9.8 06/30/2022  CO2 23 06/30/2022     Elray Mcgregor, [place] Pharmacist Assistant  [phone]

## 2022-07-18 LAB — CBC WITH DIFFERENTIAL/PLATELET
Basophils Absolute: 0.1 10*3/uL (ref 0.0–0.2)
Basos: 1 %
EOS (ABSOLUTE): 0.4 10*3/uL (ref 0.0–0.4)
Eos: 4 %
Hematocrit: 36.8 % — ABNORMAL LOW (ref 37.5–51.0)
Hemoglobin: 12.1 g/dL — ABNORMAL LOW (ref 13.0–17.7)
Immature Grans (Abs): 0.1 10*3/uL (ref 0.0–0.1)
Immature Granulocytes: 1 %
Lymphocytes Absolute: 1.4 10*3/uL (ref 0.7–3.1)
Lymphs: 14 %
MCH: 31.4 pg (ref 26.6–33.0)
MCHC: 32.9 g/dL (ref 31.5–35.7)
MCV: 96 fL (ref 79–97)
Monocytes Absolute: 1.1 10*3/uL — ABNORMAL HIGH (ref 0.1–0.9)
Monocytes: 12 %
Neutrophils Absolute: 6.4 10*3/uL (ref 1.4–7.0)
Neutrophils: 68 %
Platelets: 209 10*3/uL (ref 150–450)
RBC: 3.85 x10E6/uL — ABNORMAL LOW (ref 4.14–5.80)
RDW: 12.4 % (ref 11.6–15.4)
WBC: 9.4 10*3/uL (ref 3.4–10.8)

## 2022-07-18 LAB — COMPREHENSIVE METABOLIC PANEL
ALT: 21 IU/L (ref 0–44)
AST: 24 IU/L (ref 0–40)
Albumin/Globulin Ratio: 1.8 (ref 1.2–2.2)
Albumin: 4.4 g/dL (ref 3.7–4.7)
Alkaline Phosphatase: 62 IU/L (ref 44–121)
BUN/Creatinine Ratio: 16 (ref 10–24)
BUN: 16 mg/dL (ref 8–27)
Bilirubin Total: <0.2 mg/dL (ref 0.0–1.2)
CO2: 22 mmol/L (ref 20–29)
Calcium: 9.6 mg/dL (ref 8.6–10.2)
Chloride: 99 mmol/L (ref 96–106)
Creatinine, Ser: 0.98 mg/dL (ref 0.76–1.27)
Globulin, Total: 2.4 g/dL (ref 1.5–4.5)
Glucose: 74 mg/dL (ref 70–99)
Potassium: 4.3 mmol/L (ref 3.5–5.2)
Sodium: 134 mmol/L (ref 134–144)
Total Protein: 6.8 g/dL (ref 6.0–8.5)
eGFR: 74 mL/min/{1.73_m2} (ref >59)

## 2022-07-18 LAB — IRON,TIBC AND FERRITIN PANEL
Ferritin: 65 ng/mL (ref 30–400)
Iron Saturation: 32 % (ref 15–55)
Iron: 105 ug/dL (ref 38–169)
Total Iron Binding Capacity: 331 ug/dL (ref 250–450)
UIBC: 226 ug/dL (ref 111–343)

## 2022-07-18 LAB — LIPID PANEL
Chol/HDL Ratio: 2.4 ratio (ref 0.0–5.0)
Cholesterol, Total: 139 mg/dL (ref 100–199)
HDL: 57 mg/dL (ref >39)
LDL Chol Calc (NIH): 65 mg/dL (ref 0–99)
Triglycerides: 93 mg/dL (ref 0–149)
VLDL Cholesterol Cal: 17 mg/dL (ref 5–40)

## 2022-07-18 LAB — METHYLMALONIC ACID, SERUM: Methylmalonic Acid: 152 nmol/L (ref 0–378)

## 2022-07-18 LAB — B12 AND FOLATE PANEL
Folate: 13.4 ng/mL (ref >3.0)
Vitamin B-12: 770 pg/mL (ref 232–1245)

## 2022-07-18 LAB — T4, FREE: Free T4: 1.74 ng/dL (ref 0.82–1.77)

## 2022-07-18 LAB — CARDIOVASCULAR RISK ASSESSMENT

## 2022-07-18 LAB — TSH: TSH: 2.43 u[IU]/mL (ref 0.450–4.500)

## 2022-07-18 NOTE — Progress Notes (Signed)
Blood count improved.  Liver function normal.  Kidney function normal.  Thyroid function normal.  Cholesterol: great B12, FOLATE NORMAL IRON STUDIES NORMAL.

## 2022-07-19 NOTE — Progress Notes (Unsigned)
Subjective:  Patient ID: James Iha Sr., male    DOB: [birth year]  Age: 89 y.o. MRN: [id number]  No chief complaint on file.    History of Present illness:  Hyperlipidemia:  taking lipitor 80 mg daily   Hypothyroidism: synthroid 175 mcg daily   Hypertensive heart disease: patient has history of cad.  taking toprol xl 50 mg daily and aspirin 81 mg daily. Hx of MI 2 years ago had a stent placed. Dr. Raliegh Ip rec patient see Dr. Quentin Ore due to possible A-fib   Anemia: iron sulfate 325 mg daily and b12 1000 mcg once daily.   Asthma, moderate persistent/COPD: Albuterol or duoneb nebs if hot and muggy outside.    Current Outpatient Medications on File Prior to Visit  Medication Sig Dispense Refill   acetaminophen (TYLENOL) 325 MG tablet Take 2 tablets (650 mg total) by mouth every 6 (six) hours as needed. (Patient taking differently: Take 650 mg by mouth every 6 (six) hours as needed for mild pain or moderate pain.) 30 tablet 1   albuterol (VENTOLIN HFA) 108 (90 Base) MCG/ACT inhaler Inhale 2 puffs into the lungs every 6 (six) hours as needed for wheezing or shortness of breath. 18 each 12   aspirin 81 MG EC tablet Take 1 tablet (81 mg total) by mouth daily. Swallow whole. 90 tablet 3   atorvastatin (LIPITOR) 80 MG tablet Take 1 tablet (80 mg total) by mouth daily. 90 tablet 1   ferrous sulfate (FERROUSUL) 325 (65 FE) MG tablet Take 1 tablet (325 mg total) by mouth daily with breakfast. 30 tablet 12   ipratropium-albuterol (DUONEB) 0.5-2.5 (3) MG/3ML SOLN Take 3 mLs by nebulization every 4 (four) hours as needed (shortness of breath or wheezing). 360 mL 2   levothyroxine (SYNTHROID) 175 MCG tablet TAKE ONE TABLET BY MOUTH BEFORE BREAKFAST 90 tablet 1   metoprolol succinate (TOPROL-XL) 50 MG 24 hr tablet Take 1 tablet (50 mg total) by mouth daily. TAKE WITH OR IMMEDIATELY FOLLOWING A MEAL. 90 tablet 3   Multiple Vitamins-Minerals (MULTIVITAMIN ADULT PO) Take 1 tablet by mouth daily. Unknown  strength     Nutritional Supplements (BOOST NUTRITIONAL ENERGY PO) Take 237 mLs by mouth in the morning and at bedtime. Drinking 1-2 per day     triamcinolone (KENALOG) 0.1 % Apply 1 application topically 2 (two) times daily as needed (rash).     No current facility-administered medications on file prior to visit.   Past Medical History:  Diagnosis Date   Acquired hypothyroidism [id number]   Acute systolic heart failure ([place]) 06/25/2020   Anemia 02/05/2020   boarderline anemic   Atopic dermatitis 12/05/2019   Bacterial pneumonia, unspecified 06/14/2012   06/14/2012 CXR w/ RML PNA >Zithromax and Rocephin     Bradycardia 11/28/2018   CAD (coronary artery disease) 07/07/2020   Cancer ([place])    prostate cancer-seed implant   Closed fracture dislocation of right elbow    Closed fracture of right olecranon process 04/10/2020   COPD (chronic obstructive pulmonary disease) ([place]) 01/21/2020   Dermatitis 02/05/2020   Dizziness 02/19/2016   Essential hypertension 11/28/2018   History of prostate cancer    seed implant   Hyperlipemia    Hyperlipidemia, mixed 08/21/2008   Qualifier: Diagnosis of  By: Annamaria Boots MD, [place] D    LVH (left ventricular hypertrophy) 11/28/2018   Malnutrition of moderate degree ([place]) 08/15/2019   Mixed conductive and sensorineural hearing loss, 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   Moderate persistent asthma, uncomplicated [id number]  Nonsustained ventricular tachycardia ([place]) 01/30/2019   NSTEMI (non-ST elevated myocardial infarction) ([place]) 06/21/2020   Other fatigue 02/05/2020   PAF (paroxysmal atrial fibrillation) ([place]) 06/25/2020   Seasonal and perennial allergic rhinitis 05/17/2007       Senile osteoporosis 08/15/2019   SINUSITIS, ACUTE 06/28/2007   Qualifier: Diagnosis of  By: Royal Piedra NP, Tammy     Ventricular ectopy 11/28/2018   Past Surgical History:  Procedure Laterality Date   APPENDECTOMY     CORONARY ATHERECTOMY N/A 06/24/2020   Procedure: CORONARY ATHERECTOMY;  Surgeon: Wellington Hampshire, MD;   Location: [facility];  Service: Cardiovascular;  Laterality: N/A;   INTRAVASCULAR ULTRASOUND/IVUS N/A 06/24/2020   Procedure: Intravascular Ultrasound/IVUS;  Surgeon: Wellington Hampshire, MD;  Location: [facility];  Service: Cardiovascular;  Laterality: N/A;   LEFT HEART CATH AND CORONARY ANGIOGRAPHY N/A 06/22/2020   Procedure: LEFT HEART CATH AND CORONARY ANGIOGRAPHY;  Surgeon: Nelva Bush, MD;  Location: [facility];  Service: Cardiovascular;  Laterality: N/A;   ORIF ELBOW FRACTURE Right 04/10/2020   Procedure: OPEN REDUCTION INTERNAL FIXATION (ORIF) ELBOW/OLECRANON FRACTURE;  Surgeon: Marchia Bond, MD;  Location: [facility];  Service: Orthopedics;  Laterality: Right;    Family History  Problem Relation Age of Onset   Alzheimer's disease Father    Diabetes Mother    Social History   Socioeconomic History   Marital status: Married    Spouse name: Not on file   Number of children: Not on file   Years of education: Not on file   Highest education level: Not on file  Occupational History   Occupation: Retired    Fish farm manager: OTHER    Comment: Scientist, research (life sciences)  Tobacco Use   Smoking status: Former    Types: Cigarettes    Quit date: 1960    Years since quitting: 64.2   Smokeless tobacco: Former    Types: Chew    Quit date: 2011  Vaping Use   Vaping Use: Never used  Substance and Sexual Activity   Alcohol use: No   Drug use: No   Sexual activity: Not Currently  Other Topics Concern   Not on file  Social History Narrative   Not on file   Social Determinants of Health   Financial Resource Strain: [place]  (03/30/2022)   Overall Financial Resource Strain (CARDIA)    Difficulty of Paying Living Expenses: Not hard at all  Food Insecurity: No [place] (01/29/2021)   Hunger Vital Sign    Worried About Running Out of Food in the Last Year: Never true    [place] in the Last Year: Never true  Transportation Needs: No  Transportation Needs (03/30/2022)   PRAPARE - Hydrologist (Medical): No    Lack of Transportation (Non-Medical): No  Physical Activity: Insufficiently Active (01/29/2021)   Exercise Vital Sign    Days of Exercise per Week: 7 days    Minutes of Exercise per Session: 10 min  Stress: No Stress Concern Present (01/29/2021)   [place]    Feeling of Stress : Not at all  Social Connections: [place] (01/29/2021)   Social Connection and Isolation Panel [NHANES]    Frequency of Communication with Friends and Family: Three times a week    Frequency of Social Gatherings with Friends and Family: Three times a week    Attends Religious Services: 1 to 4 times per  year    Active Member of Clubs or Organizations: Yes    Attends Archivist Meetings: 1 to 4 times per year    Marital Status: Married    Review of Systems  Constitutional:  Negative for chills, diaphoresis, fatigue and fever.  HENT:  Negative for congestion, ear pain and sore throat.   Respiratory:  Negative for cough and shortness of breath.   Cardiovascular:  Negative for chest pain and palpitations.  Gastrointestinal:  Negative for abdominal pain, constipation, diarrhea, nausea and vomiting.  Endocrine: Negative for polydipsia, polyphagia and polyuria.  Genitourinary:  Negative for dysuria and frequency.  Musculoskeletal:  Negative for arthralgias, back pain and myalgias.  Neurological:  Negative for dizziness and headaches.  Psychiatric/Behavioral:  Negative for dysphoric mood. The patient is not nervous/anxious.      Objective:  There were no vitals taken for this visit.     07/08/2022   11:06 AM 06/30/2022   11:29 AM 03/30/2022    2:00 PM  BP/Weight  Systolic BP [id number]  Diastolic BP 62 80 74  Wt. (Lbs) 138 138   BMI 18.72 kg/m2 18.72 kg/m2     Physical Exam Vitals reviewed.  Constitutional:       Appearance: Normal appearance.  HENT:     Right Ear: Tympanic membrane normal.     Left Ear: Tympanic membrane normal.     Nose: Nose normal.     Mouth/Throat:     Pharynx: No oropharyngeal exudate or posterior oropharyngeal erythema.  Eyes:     Conjunctiva/sclera: Conjunctivae normal.  Neck:     Vascular: No carotid bruit.  Cardiovascular:     Rate and Rhythm: Normal rate and regular rhythm.     Pulses: Normal pulses.     Heart sounds: Normal heart sounds.  Pulmonary:     Effort: Pulmonary effort is normal.     Breath sounds: Normal breath sounds.  Abdominal:     General: Bowel sounds are normal.     Palpations: There is no mass.     Tenderness: There is no abdominal tenderness.  Musculoskeletal:     Cervical back: Normal range of motion.  Skin:    Findings: No bruising, erythema, lesion or rash.  Neurological:     Mental Status: He is alert and oriented to person, place, and time.  Psychiatric:        Mood and Affect: Mood normal.        Behavior: Behavior normal.     Diabetic Foot Exam - Simple   No data filed        07/08/2022   11:08 AM 01/29/2021    1:14 PM 12/15/2020   10:03 AM 09/04/2019   11:25 AM 08/15/2019    9:36 AM  Depression screen PHQ 2/9  Decreased Interest 0 0 0 0 0  Down, Depressed, Hopeless 0 0 0 0 0  PHQ - 2 Score 0 0 0 0 0       06/24/2020    8:20 AM 06/25/2020    8:51 AM 12/15/2020   10:01 AM 01/29/2021    1:18 PM 07/08/2022   11:08 AM  Fall Risk  Falls in the past year?   0 1 0  Was there an injury with Fall?   0 1 0  Was there an injury with Fall? - Comments    broke wrist in March 2021, broke his elbow in Nov 2021   Fall Risk Category Calculator   0 2 0  Fall Risk Category (Retired)   Low Moderate   (RETIRED) Patient Fall Risk Level High fall risk High fall risk Moderate fall risk Moderate fall risk   Patient at Risk for Falls Due to   Impaired balance/gait History of fall(s) No Fall Risks  Fall risk Follow up   Falls prevention discussed  Education provided Falls evaluation completed    Lab Results  Component Value Date   WBC 9.4 07/08/2022   HGB 12.1 (L) 07/08/2022   HCT 36.8 (L) 07/08/2022   PLT 209 07/08/2022   GLUCOSE 74 07/08/2022   CHOL 139 07/08/2022   TRIG 93 07/08/2022   HDL 57 07/08/2022   LDLCALC 65 07/08/2022   ALT 21 07/08/2022   AST 24 07/08/2022   NA 134 07/08/2022   K 4.3 07/08/2022   CL 99 07/08/2022   CREATININE 0.98 07/08/2022   BUN 16 07/08/2022   CO2 22 07/08/2022   TSH 2.430 07/08/2022   HGBA1C 6.3 (H) 06/22/2020      Assessment & Plan:   There are no diagnoses linked to this encounter.   Follow-up: No follow-ups on file.  An After Visit Summary was printed and given to the patient.  I, Neil Crouch have reviewed all documentation for this visit. The documentation on 07/19/22   for the exam, diagnosis, procedures, and orders are all accurate and complete.    Neil Crouch, DNP, [facility] [phone]

## 2022-07-20 ENCOUNTER — Ambulatory Visit (INDEPENDENT_AMBULATORY_CARE_PROVIDER_SITE_OTHER): Payer: Medicare HMO | Admitting: Nurse Practitioner

## 2022-07-20 ENCOUNTER — Encounter: Payer: Self-pay | Admitting: Nurse Practitioner

## 2022-07-20 VITALS — BP 138/70 | HR 80 | Temp 97.0°F | Resp 14 | Ht 72.0 in | Wt 137.0 lb

## 2022-07-20 DIAGNOSIS — R350 Frequency of micturition: Secondary | ICD-10-CM | POA: Insufficient documentation

## 2022-07-20 DIAGNOSIS — N3000 Acute cystitis without hematuria: Secondary | ICD-10-CM

## 2022-07-20 DIAGNOSIS — Z125 Encounter for screening for malignant neoplasm of prostate: Secondary | ICD-10-CM | POA: Diagnosis not present

## 2022-07-20 LAB — POCT URINALYSIS DIP (CLINITEK)
Bilirubin, UA: NEGATIVE
Blood, UA: NEGATIVE
Glucose, UA: NEGATIVE mg/dL
Ketones, POC UA: NEGATIVE mg/dL
Nitrite, UA: NEGATIVE
POC PROTEIN,UA: NEGATIVE
Spec Grav, UA: <=1.005 — AB (ref 1.010–1.025)
Urobilinogen, UA: 0.2 E.U./dL
pH, UA: 6 (ref 5.0–8.0)

## 2022-07-20 MED ORDER — NITROFURANTOIN MONOHYD MACRO 100 MG PO CAPS: 100.0000 mg | ORAL_CAPSULE | Freq: Two times a day (BID) | ORAL | 0 refills | Status: DC

## 2022-07-20 NOTE — Patient Instructions (Signed)
Finish the course of Nitrofurantoin (macrobid) 100 mg twice a day with foods Drink plenty of water   Urinary Tract Infection, Adult  A urinary tract infection (UTI) is an infection of any part of the urinary tract. The urinary tract includes the kidneys, ureters, bladder, and urethra. These organs make, store, and get rid of urine in the body. An upper UTI affects the ureters and kidneys. A lower UTI affects the bladder and urethra. What are the causes? Most urinary tract infections are caused by bacteria in your genital area around your urethra, where urine leaves your body. These bacteria grow and cause inflammation of your urinary tract. What increases the risk? You are more likely to develop this condition if: You have a urinary catheter that stays in place. You are not able to control when you urinate or have a bowel movement (incontinence). You are male and you: Use a spermicide or diaphragm for birth control. Have low estrogen levels. Are pregnant. You have certain genes that increase your risk. You are sexually active. You take antibiotic medicines. You have a condition that causes your flow of urine to slow down, such as: An enlarged prostate, if you are male. Blockage in your urethra. A kidney stone. A nerve condition that affects your bladder control (neurogenic bladder). Not getting enough to drink, or not urinating often. You have certain medical conditions, such as: Diabetes. A weak disease-fighting system (immunesystem). Sickle cell disease. Gout. Spinal cord injury. What are the signs or symptoms? Symptoms of this condition include: Needing to urinate right away (urgency). Frequent urination. This may include small amounts of urine each time you urinate. Pain or burning with urination. Blood in the urine. Urine that smells bad or unusual. Trouble urinating. Cloudy urine. Vaginal discharge, if you are male. Pain in the abdomen or the lower back. You may  also have: Vomiting or a decreased appetite. Confusion. Irritability or tiredness. A fever or chills. Diarrhea. The first symptom in older adults may be confusion. In some cases, they may not have any symptoms until the infection has worsened. How is this diagnosed? This condition is diagnosed based on your medical history and a physical exam. You may also have other tests, including: Urine tests. Blood tests. Tests for STIs (sexually transmitted infections). If you have had more than one UTI, a cystoscopy or imaging studies may be done to determine the cause of the infections. How is this treated? Treatment for this condition includes: Antibiotic medicine. Over-the-counter medicines to treat discomfort. Drinking enough water to stay hydrated. If you have frequent infections or have other conditions such as a kidney stone, you may need to see a health care provider who specializes in the urinary tract (urologist). In rare cases, urinary tract infections can cause sepsis. Sepsis is a life-threatening condition that occurs when the body responds to an infection. Sepsis is treated in the hospital with IV antibiotics, fluids, and other medicines. Follow these instructions at home:  Medicines Take over-the-counter and prescription medicines only as told by your health care provider. If you were prescribed an antibiotic medicine, take it as told by your health care provider. Do not stop using the antibiotic even if you start to feel better. General instructions Make sure you: Empty your bladder often and completely. Do not hold urine for long periods of time. Empty your bladder after sex. Wipe from front to back after urinating or having a bowel movement if you are male. Use each tissue only one time when you wipe.  Drink enough fluid to keep your urine pale yellow. Keep all follow-up visits. This is important. Contact a health care provider if: Your symptoms do not get better after 1-2  days. Your symptoms go away and then return. Get help right away if: You have severe pain in your back or your lower abdomen. You have a fever or chills. You have nausea or vomiting. Summary A urinary tract infection (UTI) is an infection of any part of the urinary tract, which includes the kidneys, ureters, bladder, and urethra. Most urinary tract infections are caused by bacteria in your genital area. Treatment for this condition often includes antibiotic medicines. If you were prescribed an antibiotic medicine, take it as told by your health care provider. Do not stop using the antibiotic even if you start to feel better. Keep all follow-up visits. This is important. This information is not intended to replace advice given to you by your health care provider. Make sure you discuss any questions you have with your health care provider. Document Revised: 12/06/2019 Document Reviewed: 12/06/2019 Elsevier Patient Education  [place].

## 2022-07-20 NOTE — Assessment & Plan Note (Signed)
PSA will be checked today

## 2022-07-20 NOTE — Assessment & Plan Note (Signed)
  Start Macrobid 100 mg BD for 5 days Urine culture will be sent   Drink plenty of fluids  UTI handsout provided and discussed with patient

## 2022-07-21 LAB — PSA, TOTAL AND FREE
PSA, Free: <0.02 ng/mL
Prostate Specific Ag, Serum: <0.1 ng/mL (ref 0.0–4.0)

## 2022-07-21 LAB — URINE CULTURE: Organism ID, Bacteria: NO GROWTH

## 2022-07-22 ENCOUNTER — Ambulatory Visit: Payer: Medicare HMO | Admitting: Family Medicine

## 2022-07-26 ENCOUNTER — Ambulatory Visit (INDEPENDENT_AMBULATORY_CARE_PROVIDER_SITE_OTHER): Payer: Medicare HMO

## 2022-07-26 VITALS — BP 112/65

## 2022-07-26 DIAGNOSIS — Z0000 Encounter for general adult medical examination without abnormal findings: Secondary | ICD-10-CM

## 2022-07-26 NOTE — Patient Instructions (Signed)
[name] , Thank you for taking time to come for your Medicare Wellness Visit. I appreciate your ongoing commitment to your health goals. Please review the following plan we discussed and let me know if I can assist you in the future.   Screening recommendations/referrals: Colonoscopy: No longer required Recommended yearly ophthalmology/optometry visit for glaucoma screening and checkup Recommended yearly dental visit for hygiene and checkup  Vaccinations: Influenza vaccine: Due yearly in the fall Pneumococcal vaccine: Complete Tdap vaccine: Due - you can get this at the pharmacy Shingles vaccine: Due - you can get this at the pharmacy     Preventive Care 65 Years and Older, Male Preventive care refers to lifestyle choices and visits with your health care provider that can promote health and wellness. What does preventive care include? A yearly physical exam. This is also called an annual well check. Dental exams once or twice a year. Routine eye exams. Ask your health care provider how often you should have your eyes checked. Personal lifestyle choices, including: Daily care of your teeth and gums. Regular physical activity. Eating a healthy diet. Avoiding tobacco and drug use. Limiting alcohol use. Practicing safe sex. Taking low doses of aspirin every day. Taking vitamin and mineral supplements as recommended by your health care provider. What happens during an annual well check? The services and screenings done by your health care provider during your annual well check will depend on your age, overall health, lifestyle risk factors, and family history of disease. Counseling  Your health care provider may ask you questions about your: Alcohol use. Tobacco use. Drug use. Emotional well-being. Home and relationship well-being. Sexual activity. Eating habits. History of falls. Memory and ability to understand (cognition). Work and work Statistician. Screening  You may have  the following tests or measurements: Height, weight, and BMI. Blood pressure. Lipid and cholesterol levels. These may be checked every 5 years, or more frequently if you are over 63 years old. Skin check. Lung cancer screening. You may have this screening every year starting at age 4 if you have a 30-pack-year history of smoking and currently smoke or have quit within the past 15 years. Fecal occult blood test (FOBT) of the stool. You may have this test every year starting at age 66. Flexible sigmoidoscopy or colonoscopy. You may have a sigmoidoscopy every 5 years or a colonoscopy every 10 years starting at age 26. Prostate cancer screening. Recommendations will vary depending on your family history and other risks. Hepatitis C blood test. Hepatitis B blood test. Sexually transmitted disease (STD) testing. Diabetes screening. This is done by checking your blood sugar (glucose) after you have not eaten for a while (fasting). You may have this done every 1-3 years. Abdominal aortic aneurysm (AAA) screening. You may need this if you are a current or former smoker. Osteoporosis. You may be screened starting at age 15 if you are at high risk. Talk with your health care provider about your test results, treatment options, and if necessary, the need for more tests. Vaccines  Your health care provider may recommend certain vaccines, such as: Influenza vaccine. This is recommended every year. Tetanus, diphtheria, and acellular pertussis (Tdap, Td) vaccine. You may need a Td booster every 10 years. Zoster vaccine. You may need this after age 18. Pneumococcal 13-valent conjugate (PCV13) vaccine. One dose is recommended after age 76. Pneumococcal polysaccharide (PPSV23) vaccine. One dose is recommended after age 33. Talk to your health care provider about which screenings and vaccines you need  and how often you need them. This information is not intended to replace advice given to you by your health  care provider. Make sure you discuss any questions you have with your health care provider. Document Released: 05/22/2015 Document Revised: 01/13/2016 Document Reviewed: 02/24/2015 Elsevier Interactive Patient Education  2017 [employer] Prevention in the Home Falls can cause injuries. They can happen to people of all ages. There are many things you can do to make your home safe and to help prevent falls. What can I do on the outside of my home? Regularly fix the edges of walkways and driveways and fix any cracks. Remove anything that might make you trip as you walk through a door, such as a raised step or threshold. Trim any bushes or trees on the path to your home. Use bright outdoor lighting. Clear any walking paths of anything that might make someone trip, such as rocks or tools. Regularly check to see if handrails are loose or broken. Make sure that both sides of any steps have handrails. Any raised decks and porches should have guardrails on the edges. Have any leaves, snow, or ice cleared regularly. Use sand or salt on walking paths during winter. Clean up any spills in your garage right away. This includes oil or grease spills. What can I do in the bathroom? Use night lights. Install grab bars by the toilet and in the tub and shower. Do not use towel bars as grab bars. Use non-skid mats or decals in the tub or shower. If you need to sit down in the shower, use a plastic, non-slip stool. Keep the floor dry. Clean up any water that spills on the floor as soon as it happens. Remove soap buildup in the tub or shower regularly. Attach bath mats securely with double-sided non-slip rug tape. Do not have throw rugs and other things on the floor that can make you trip. What can I do in the bedroom? Use night lights. Make sure that you have a light by your bed that is easy to reach. Do not use any sheets or blankets that are too big for your bed. They should not hang down onto the  floor. Have a firm chair that has side arms. You can use this for support while you get dressed. Do not have throw rugs and other things on the floor that can make you trip. What can I do in the kitchen? Clean up any spills right away. Avoid walking on wet floors. Keep items that you use a lot in easy-to-reach places. If you need to reach something above you, use a strong step stool that has a grab bar. Keep electrical cords out of the way. Do not use floor polish or wax that makes floors slippery. If you must use wax, use non-skid floor wax. Do not have throw rugs and other things on the floor that can make you trip. What can I do with my stairs? Do not leave any items on the stairs. Make sure that there are handrails on both sides of the stairs and use them. Fix handrails that are broken or loose. Make sure that handrails are as long as the stairways. Check any carpeting to make sure that it is firmly attached to the stairs. Fix any carpet that is loose or worn. Avoid having throw rugs at the top or bottom of the stairs. If you do have throw rugs, attach them to the floor with carpet tape. Make sure that you  have a light switch at the top of the stairs and the bottom of the stairs. If you do not have them, ask someone to add them for you. What else can I do to help prevent falls? Wear shoes that: Do not have high heels. Have rubber bottoms. Are comfortable and fit you well. Are closed at the toe. Do not wear sandals. If you use a stepladder: Make sure that it is fully opened. Do not climb a closed stepladder. Make sure that both sides of the stepladder are locked into place. Ask someone to hold it for you, if possible. Clearly mark and make sure that you can see: Any grab bars or handrails. First and last steps. Where the edge of each step is. Use tools that help you move around (mobility aids) if they are needed. These include: Canes. Walkers. Scooters. Crutches. Turn on the  lights when you go into a dark area. Replace any light bulbs as soon as they burn out. Set up your furniture so you have a clear path. Avoid moving your furniture around. If any of your floors are uneven, fix them. If there are any pets around you, be aware of where they are. Review your medicines with your doctor. Some medicines can make you feel dizzy. This can increase your chance of falling. Ask your doctor what other things that you can do to help prevent falls. This information is not intended to replace advice given to you by your health care provider. Make sure you discuss any questions you have with your health care provider. Document Released: 02/19/2009 Document Revised: 10/01/2015 Document Reviewed: 05/30/2014 Elsevier Interactive Patient Education  2017 [employer].

## 2022-07-26 NOTE — Progress Notes (Signed)
Subjective:   James Libby Sr. is a 89 y.o. male who presents for Medicare Annual/Subsequent preventive examination. I connected with  James Iha Sr. on 07/26/22 by a audio enabled telemedicine application and verified that I am speaking with the correct person using two identifiers.  Patient Location: Home  Provider Location: Office/Clinic  I discussed the limitations of evaluation and management by telemedicine. The patient expressed understanding and agreed to proceed. On the call in addition to [name] and myself was his daughter, [name].  Cardiac Risk Factors include: advanced age (>64men, >23 women);male gender     Objective:    Today's Vitals   07/26/22 1507  BP: 112/65   There is no height or weight on file to calculate BMI.     01/29/2021    1:17 PM 04/10/2020   11:59 AM 04/06/2020    1:04 PM 12/31/2014    9:32 AM  Advanced Directives  Does Patient Have a Medical Advance Directive? No No No No  Would patient like information on creating a medical advance directive? No - Patient declined No - Patient declined No - Patient declined     Current Medications (verified) Outpatient Encounter Medications as of 07/26/2022  Medication Sig   acetaminophen (TYLENOL) 325 MG tablet Take 2 tablets (650 mg total) by mouth every 6 (six) hours as needed. (Patient taking differently: Take 650 mg by mouth every 6 (six) hours as needed for mild pain or moderate pain.)   albuterol (VENTOLIN HFA) 108 (90 Base) MCG/ACT inhaler Inhale 2 puffs into the lungs every 6 (six) hours as needed for wheezing or shortness of breath.   aspirin 81 MG EC tablet Take 1 tablet (81 mg total) by mouth daily. Swallow whole.   atorvastatin (LIPITOR) 80 MG tablet Take 1 tablet (80 mg total) by mouth daily.   ferrous sulfate (FERROUSUL) 325 (65 FE) MG tablet Take 1 tablet (325 mg total) by mouth daily with breakfast.   ipratropium-albuterol (DUONEB) 0.5-2.5 (3) MG/3ML SOLN Take 3 mLs by  nebulization every 4 (four) hours as needed (shortness of breath or wheezing).   levothyroxine (SYNTHROID) 175 MCG tablet TAKE ONE TABLET BY MOUTH BEFORE BREAKFAST   metoprolol succinate (TOPROL-XL) 50 MG 24 hr tablet Take 1 tablet (50 mg total) by mouth daily. TAKE WITH OR IMMEDIATELY FOLLOWING A MEAL.   Multiple Vitamins-Minerals (MULTIVITAMIN ADULT PO) Take 1 tablet by mouth daily. Unknown strength   nitrofurantoin, macrocrystal-monohydrate, (MACROBID) 100 MG capsule Take 1 capsule (100 mg total) by mouth 2 (two) times daily.   Nutritional Supplements (BOOST NUTRITIONAL ENERGY PO) Take 237 mLs by mouth in the morning and at bedtime. Drinking 1-2 per day   triamcinolone (KENALOG) 0.1 % Apply 1 application topically 2 (two) times daily as needed (rash).   No facility-administered encounter medications on file as of 07/26/2022.    Allergies (verified) Clarithromycin   History: Past Medical History:  Diagnosis Date   Acquired hypothyroidism [id number]   Acute systolic heart failure ([place]) 06/25/2020   Anemia 02/05/2020   boarderline anemic   Atopic dermatitis 12/05/2019   Bacterial pneumonia, unspecified 06/14/2012   06/14/2012 CXR w/ RML PNA >Zithromax and Rocephin     Bradycardia 11/28/2018   CAD (coronary artery disease) 07/07/2020   Cancer ([place])    prostate cancer-seed implant   Closed fracture dislocation of right elbow    Closed fracture of right olecranon process 04/10/2020   COPD (chronic obstructive pulmonary disease) ([place]) 01/21/2020   Dermatitis 02/05/2020  Dizziness 02/19/2016   Essential hypertension 11/28/2018   History of prostate cancer    seed implant   Hyperlipemia    Hyperlipidemia, mixed 08/21/2008   Qualifier: Diagnosis of  By: Annamaria Boots MD, Clinton D    LVH (left ventricular hypertrophy) 11/28/2018   Malnutrition of moderate degree ([place]) 08/15/2019   Mixed conductive and sensorineural hearing loss, 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   Moderate persistent asthma, uncomplicated [id number]    Nonsustained ventricular tachycardia ([place]) 01/30/2019   NSTEMI (non-ST elevated myocardial infarction) ([place]) 06/21/2020   Other fatigue 02/05/2020   PAF (paroxysmal atrial fibrillation) ([place]) 06/25/2020   Seasonal and perennial allergic rhinitis 05/17/2007       Senile osteoporosis 08/15/2019   SINUSITIS, ACUTE 06/28/2007   Qualifier: Diagnosis of  By: Royal Piedra NP, Tammy     Ventricular ectopy 11/28/2018   Past Surgical History:  Procedure Laterality Date   APPENDECTOMY     CORONARY ATHERECTOMY N/A 06/24/2020   Procedure: CORONARY ATHERECTOMY;  Surgeon: Wellington Hampshire, MD;  Location: [facility];  Service: Cardiovascular;  Laterality: N/A;   INTRAVASCULAR ULTRASOUND/IVUS N/A 06/24/2020   Procedure: Intravascular Ultrasound/IVUS;  Surgeon: Wellington Hampshire, MD;  Location: [facility];  Service: Cardiovascular;  Laterality: N/A;   LEFT HEART CATH AND CORONARY ANGIOGRAPHY N/A 06/22/2020   Procedure: LEFT HEART CATH AND CORONARY ANGIOGRAPHY;  Surgeon: Nelva Bush, MD;  Location: [facility];  Service: Cardiovascular;  Laterality: N/A;   ORIF ELBOW FRACTURE Right 04/10/2020   Procedure: OPEN REDUCTION INTERNAL FIXATION (ORIF) ELBOW/OLECRANON FRACTURE;  Surgeon: Marchia Bond, MD;  Location: [facility];  Service: Orthopedics;  Laterality: Right;   Family History  Problem Relation Age of Onset   Alzheimer's disease Father    Diabetes Mother    Social History   Socioeconomic History   Marital status: Married    Spouse name: [name]   Number of children: Not on file   Years of education: Not on file   Highest education level: Not on file  Occupational History   Occupation: Retired    Fish farm manager: OTHER    Comment: Scientist, research (life sciences)  Tobacco Use   Smoking status: Former    Types: Cigarettes    Quit date: 1960    Years since quitting: 64.2   Smokeless tobacco: Former    Types: Chew    Quit date: 2011  Vaping Use   Vaping Use: Never used   Substance and Sexual Activity   Alcohol use: No   Drug use: No   Sexual activity: Not Currently  Other Topics Concern   Not on file  Social History Narrative   Not on file   Social Determinants of Health   Financial Resource Strain: [place]  (03/30/2022)   Overall Financial Resource Strain (CARDIA)    Difficulty of Paying Living Expenses: Not hard at all  Food Insecurity: No [place] (01/29/2021)   Hunger Vital Sign    Worried About Running Out of Food in the Last Year: Never true    [place] in the Last Year: Never true  Transportation Needs: No Transportation Needs (03/30/2022)   PRAPARE - Hydrologist (Medical): No    Lack of Transportation (Non-Medical): No  Physical Activity: Insufficiently Active (01/29/2021)   Exercise Vital Sign    Days of Exercise per Week: 7 days    Minutes of Exercise per Session: 10 min  Stress: No Stress Concern Present (01/29/2021)   [employer] of  Occupational Health - Occupational Stress Questionnaire    Feeling of Stress : Not at all  Social Connections: Socially Integrated (01/29/2021)   Social Connection and Isolation Panel [NHANES]    Frequency of Communication with Friends and Family: Three times a week    Frequency of Social Gatherings with Friends and Family: Three times a week    Attends Religious Services: 1 to 4 times per year    Active Member of Clubs or Organizations: Yes    Attends Archivist Meetings: 1 to 4 times per year    Marital Status: Married    Tobacco Counseling Counseling given: Not Answered   Clinical Intake:  Pre-visit preparation completed: Yes Pain : No/denies pain   BMI - recorded: 18.58 Nutritional Status: BMI <19  Underweight Nutritional Risks: None Diabetes: No How often do you need to have someone help you when you read instructions, pamphlets, or other written materials from your doctor or pharmacy?: 3 - Sometimes Interpreter Needed?: No     Activities of Daily Living    07/26/2022    3:11 PM 07/08/2022   11:08 AM  In your present state of health, do you have any difficulty performing the following activities:  Hearing? 1 0  Vision? 0 0  Difficulty concentrating or making decisions? 0 0  Walking or climbing stairs? 0 0  Dressing or bathing? 0 0  Doing errands, shopping? 1 0  Preparing Food and eating ? N   Using the Toilet? N   In the past six months, have you accidently leaked urine? Y   Do you have problems with loss of bowel control? N   Managing your Medications? N   Comment uses packaging   Managing your Finances? N     Patient Care Team: Rochel Brome, MD as PCP - General (Family Medicine) Park Liter, MD as PCP - Cardiology (Cardiology) Constance Haw, MD as PCP - Electrophysiology (Cardiology) Lane Hacker, [facility] (Pharmacist)  Indicate any recent Medical Services you may have received from other than [facility] providers in the past year (date may be approximate).     Assessment:   This is a routine wellness examination for [place].  Hearing/Vision screen No results found.  Dietary issues and exercise activities discussed: Current Exercise Habits: Home exercise routine, Type of exercise: stretching, Time (Minutes): 15, Frequency (Times/Week): 5, Weekly Exercise (Minutes/Week): 75, Intensity: Mild, Exercise limited by: None identified   Goals Addressed   None    Depression Screen    07/26/2022    3:11 PM 07/08/2022   11:08 AM 01/29/2021    1:14 PM 12/15/2020   10:03 AM 09/04/2019   11:25 AM 08/15/2019    9:36 AM 08/15/2019    9:20 AM  PHQ 2/9 Scores  PHQ - 2 Score 0 0 0 0 0 0 0    Fall Risk    07/26/2022    3:10 PM 07/08/2022   11:08 AM 01/29/2021    1:18 PM 12/15/2020   10:01 AM 12/05/2019   10:03 AM  Painter in the past year? 0 0 1 0 0  Number falls in past yr: 0 0 0 0 0  Injury with Fall? 0 0 1 0 0  Comment   broke wrist in March 2021, broke his elbow in Nov 2021    Risk for  fall due to : No Fall Risks No Fall Risks History of fall(s) Impaired balance/gait   Follow up Falls evaluation completed;Education provided Falls  evaluation completed Education provided Falls prevention discussed Falls evaluation completed    FALL RISK PREVENTION PERTAINING TO THE HOME:  Any stairs in or around the home? No  If so, are there any without handrails? No  Home free of loose throw rugs in walkways, pet beds, electrical cords, etc? Yes  Adequate lighting in your home to reduce risk of falls? Yes   ASSISTIVE DEVICES UTILIZED TO PREVENT FALLS:  Use of a cane, walker or w/c? No   Cognitive Function:    12/12/2019    8:48 AM  MMSE - Mini Mental State Exam  Orientation to time 4  Orientation to Place 3  Registration 3  Attention/ Calculation 2  Recall 1  Language- name 2 objects 2  Language- repeat 1  Language- follow 3 step command 3  Language- read & follow direction 1  Write a sentence 1  Copy design 0  Total score 21        01/29/2021    1:20 PM  6CIT Screen  What Year? 0 points  What month? 0 points  What time? 0 points  Count back from 20 0 points  Months in reverse 0 points  Repeat phrase 0 points  Total Score 0 points    Immunizations Immunization History  Administered Date(s) Administered   Fluad Quad(high Dose 65+) 03/22/2021   Influenza Split 02/06/2013, 02/18/2015   Influenza Whole 02/07/2011, 03/15/2012   Influenza, High Dose Seasonal PF 02/01/2017, 01/17/2018   Influenza,inj,Quad PF,6+ Mos 01/07/2014   Influenza-Unspecified 01/21/2020, 03/09/2022   PFIZER(Purple Top)SARS-COV-2 Vaccination 06/10/2019, 07/01/2019, 02/28/2020   Pneumococcal Conjugate-13 12/31/2014   Pneumococcal Polysaccharide-23 02/07/2011   Tdap 08/01/2011    TDAP status: Due, Education has been provided regarding the importance of this vaccine. Advised may receive this vaccine at local pharmacy or Health Dept. Aware to provide a copy of the vaccination record if obtained  from local pharmacy or Health Dept. Verbalized acceptance and understanding.  Flu Vaccine status: Up to date  Pneumococcal vaccine status: Up to date  Covid-19 vaccine status: Information provided on how to obtain vaccines.   Qualifies for Shingles Vaccine? Yes   Zostavax completed No   Shingrix Completed?: No.    Education has been provided regarding the importance of this vaccine. Patient has been advised to call insurance company to determine out of pocket expense if they have not yet received this vaccine. Advised may also receive vaccine at local pharmacy or Health Dept. Verbalized acceptance and understanding.  Screening Tests Health Maintenance  Topic Date Due   Zoster Vaccines- Shingrix (1 of 2) Never done   DTaP/Tdap/Td (2 - Td or Tdap) 07/31/2021   COVID-19 Vaccine (4 - 2023-24 season) 01/07/2022   Medicare Annual Wellness (AWV)  01/29/2022   Pneumonia Vaccine 40+ Years old  Completed   INFLUENZA VACCINE  Completed   HPV VACCINES  Aged Out    Health Maintenance  Health Maintenance Due  Topic Date Due   Zoster Vaccines- Shingrix (1 of 2) Never done   DTaP/Tdap/Td (2 - Td or Tdap) 07/31/2021   COVID-19 Vaccine (4 - 2023-24 season) 01/07/2022   Medicare Annual Wellness (AWV)  01/29/2022    Colorectal cancer screening: No longer required.   Lung Cancer Screening: (Low Dose CT Chest recommended if Age 60-80 years, 30 pack-year currently smoking OR have quit w/in 15years.) does not qualify.   Lung Cancer Screening Referral: N/A  Additional Screening:  Dental Screening: Recommended annual dental exams for proper oral hygiene  [employer]  Referral / Chronic Care Management: CRR required this visit?  No   CCM required this visit?  No      Plan:    1- Patient due for Tetanus and Shingrix Vaccine - recommend getting at the pharmacy 2- Continue stretching exercises daily  I have personally reviewed and noted the following in the patient's chart:    Medical and social history Use of alcohol, tobacco or illicit drugs  Current medications and supplements including opioid prescriptions. Patient is not currently taking opioid prescriptions. Functional ability and status Nutritional status Physical activity Advanced directives List of other physicians Hospitalizations, surgeries, and ER visits in previous 12 months Vitals Screenings to include cognitive, depression, and falls Referrals and appointments  In addition, I have reviewed and discussed with patient certain preventive protocols, quality metrics, and best practice recommendations. A written personalized care plan for preventive services as well as general preventive health recommendations were provided to patient.     Erie Noe, LPN   [id number]

## 2022-07-31 NOTE — Progress Notes (Unsigned)
  Electrophysiology Office Follow up Visit Note:    Date:  08/03/2022   ID:  James Iha Sr., DOB 1931/07/05, MRN [id number]  PCP:  Rochel Brome, MD  [facility] Cardiologist:  [name], MD  [facility] Electrophysiologist:  Will Meredith Leeds, MD    Interval History:    James Huxley Sr. is a 89 y.o. male who presents for a follow up visit.   He has been seen by Dr. Curt Bears in the past for his atrial fibrillation. He has a history of frailty and falls.  For this reason he has not been anticoagulated in the past.  The patient and his daughter were at a recent appointment with Dr. Agustin Cree in February and discussed options for stroke risk mitigation moving forward as it relates to his atrial fibrillation.  They discussed using a loop recorder for monitoring atrial fibrillation episodes/burden to guide anticoagulation decision.  They also discussed left atrial appendage occlusion.  They asked to be referred for a discussion surrounding the Watchman device.  Today he is with his daughter in clinic.  He is fairly active and gets out in the yard to do some minor yard work.  He has a small dog that he takes for walks.  He is fallen in the past.  His falls appear to be mechanical.  No loss of consciousness.  He is not aware of any atrial fibrillation episodes.  But the patient and his family are interested in a conservative management strategy given his goals of care and advanced age.     Past medical, surgical, social and family history were reviewed.  ROS:   Please see the history of present illness.    All other systems reviewed and are negative.  EKGs/Labs/Other Studies Reviewed:    The following studies were reviewed today:    Physical Exam:    VS:  BP 110/60   Pulse 70   Ht 6' (1.829 m)   Wt 138 lb (62.6 kg)   SpO2 97%   BMI 18.72 kg/m     Wt Readings from Last 3 Encounters:  08/03/22 138 lb (62.6 kg)  07/20/22 137 lb (62.1 kg)  07/08/22 138 lb  (62.6 kg)     GEN:  Well nourished, well developed in no acute distress CARDIAC: RRR, no murmurs, rubs, gallops RESPIRATORY:  Clear to auscultation without rales, wheezing or rhonchi       ASSESSMENT:    1. Paroxysmal atrial fibrillation (HCC)   2. Coronary artery disease involving native coronary artery of native heart without angina pectoris    PLAN:    In order of problems listed above:   #Paroxysmal atrial fibrillation Low burden.  Not anticoagulated because of frailty and frequent falls. Given his age and frailty, I do not think he is a good candidate for invasive EP procedures.  I discussed using a loop recorder for monitoring atrial fibrillation burden to help guide his anticoagulation strategy.  The patient would like to proceed with a conservative management strategy with intermittent visits with Dr. Agustin Cree.  I think this is very reasonable.  #Coronary artery disease No ischemic symptoms today.  Continue prescribed medical therapy.   Follow-up with me on an as-needed basis.  Continue routine follow-up with Dr. Curt Bears and Agustin Cree.      Signed, Lars Mage, MD, [facility], [facility] 08/03/2022 11:42 AM    Electrophysiology [facility]

## 2022-08-03 ENCOUNTER — Encounter: Payer: Self-pay | Admitting: Cardiology

## 2022-08-03 ENCOUNTER — Ambulatory Visit: Payer: Medicare HMO | Attending: Cardiology | Admitting: Cardiology

## 2022-08-03 VITALS — BP 110/60 | HR 70 | Ht 72.0 in | Wt 138.0 lb

## 2022-08-03 DIAGNOSIS — I2510 Atherosclerotic heart disease of native coronary artery without angina pectoris: Secondary | ICD-10-CM | POA: Diagnosis not present

## 2022-08-03 DIAGNOSIS — I480 Paroxysmal atrial fibrillation: Secondary | ICD-10-CM

## 2022-08-03 NOTE — Patient Instructions (Signed)
Medication Instructions:  Your physician recommends that you continue on your current medications as directed. Please refer to the Current Medication list given to you today.  *If you need a refill on your cardiac medications before your next appointment, please call your pharmacy*  Follow-Up: At [facility], you and your health needs are our priority.  As part of our continuing mission to provide you with exceptional heart care, we have created designated Provider Care Teams.  These Care Teams include your primary Cardiologist (physician) and Advanced Practice Providers (APPs -  Physician Assistants and Nurse Practitioners) who all work together to provide you with the care you need, when you need it.  Your next appointment:   As needed with Dr. Lambert 

## 2022-08-12 ENCOUNTER — Telehealth: Payer: Self-pay

## 2022-08-12 NOTE — Progress Notes (Signed)
Care Management & Coordination Services Pharmacy Team  Reason for Encounter: Medication coordination and delivery  Contacted patient to discuss medications and coordinate delivery from [facility].  Spoke with family on 08/12/2022   Cycle dispensing form sent to Billee Cashing for review.   Last adherence delivery date:07/25/22      Patient is due for next adherence delivery on: 08/24/22  This delivery to include: Adherence Packaging  30 Days  Levothyroxine 1 BB Atorvastatin 80mg  1 B Aspirin 81mg  1 B Metoprolol Succ ER 50mg  1 B Ferrous Sulfate 325mg  1 B Vitamin B12 1 B Fexofenadine 180mg  1 B Albuterol Inhaler 90 mcg  2 puffs every 6 hours as needed    Patient declined the following medications this month: [facility]oneb- Gets [facility]. [facility] did not have it in stock  Refills requested from providers include: Atorvastatin 80mg    Confirmed delivery date of 08/24/22, advised patient that pharmacy will contact them the morning of delivery.  Any concerns about your medications? No  How often do you forget or accidentally miss a dose? Never  Do you use a pillbox? Yes  Is patient in packaging Yes  If yes  What is the date on your next pill pack? 08/12/22  Any concerns or issues with your packaging? No concerns   Recent blood pressure readings are as follows: 08/03/22 110/60, 07/20/22 138/70, 07/08/22 112/62  Chart review: Recent office visits:  07/26/22 Caro Hight LPN. Medicare Annual Wellness. No med changes.   07/20/22 Lurline Del FNP. Seen for Urinary Frequency. Started on Macrobid.  Recent consult visits:  08/03/22 (Cardiology) Steffanie Dunn MD. Seen for Afib. No med changes.  Hospital visits:  None  Medications: Outpatient Encounter Medications as of 08/12/2022  Medication Sig   acetaminophen (TYLENOL) 325 MG tablet Take 2 tablets (650 mg total) by mouth every 6 (six) hours as needed. (Patient taking differently: Take 650 mg by mouth every 6 (six)  hours as needed for mild pain or moderate pain.)   albuterol (VENTOLIN HFA) 108 (90 Base) MCG/ACT inhaler Inhale 2 puffs into the lungs every 6 (six) hours as needed for wheezing or shortness of breath.   aspirin 81 MG EC tablet Take 1 tablet (81 mg total) by mouth daily. Swallow whole.   atorvastatin (LIPITOR) 80 MG tablet Take 1 tablet (80 mg total) by mouth daily.   ferrous sulfate (FERROUSUL) 325 (65 FE) MG tablet Take 1 tablet (325 mg total) by mouth daily with breakfast.   ipratropium-albuterol (DUONEB) 0.5-2.5 (3) MG/3ML SOLN Take 3 mLs by nebulization every 4 (four) hours as needed (shortness of breath or wheezing).   levothyroxine (SYNTHROID) 175 MCG tablet TAKE ONE TABLET BY MOUTH BEFORE BREAKFAST   metoprolol succinate (TOPROL-XL) 50 MG 24 hr tablet Take 1 tablet (50 mg total) by mouth daily. TAKE WITH OR IMMEDIATELY FOLLOWING A MEAL.   Multiple Vitamins-Minerals (MULTIVITAMIN ADULT PO) Take 1 tablet by mouth daily. Unknown strength   nitrofurantoin, macrocrystal-monohydrate, (MACROBID) 100 MG capsule Take 1 capsule (100 mg total) by mouth 2 (two) times daily. (Patient not taking: Reported on 08/03/2022)   Nutritional Supplements (BOOST NUTRITIONAL ENERGY PO) Take 237 mLs by mouth in the morning and at bedtime. Drinking 1-2 per day   triamcinolone (KENALOG) 0.1 % Apply 1 application topically 2 (two) times daily as needed (rash). (Patient not taking: Reported on 08/03/2022)   No facility-administered encounter medications on file as of 08/12/2022.   BP Readings from Last 3 Encounters:  08/03/22 110/60  07/26/22 112/65  07/20/22 138/70    Pulse Readings from Last 3 Encounters:  08/03/22 70  07/20/22 80  07/08/22 80    Lab Results  Component Value Date/Time   HGBA1C 6.3 (H) 06/22/2020 05:13 AM   Lab Results  Component Value Date   CREATININE 0.98 07/08/2022   BUN 16 07/08/2022   GFRNONAA 78 06/29/2020   GFRAA 90 06/29/2020   NA 134 07/08/2022   K 4.3 07/08/2022   CALCIUM 9.6  07/08/2022   CO2 22 07/08/2022     Roxana Hires, CMA Clinical Pharmacist Assistant  [phone]

## 2022-08-14 NOTE — Addendum Note (Signed)
Addended byBlane Ohara on: 08/14/2022 08:30 PM   Modules accepted: Level of Service

## 2022-08-19 ENCOUNTER — Other Ambulatory Visit: Payer: Self-pay

## 2022-08-19 MED ORDER — ATORVASTATIN CALCIUM 80 MG PO TABS: 80.0000 mg | ORAL_TABLET | Freq: Every day | ORAL | 1 refills | Status: DC

## 2022-08-26 DIAGNOSIS — H524 Presbyopia: Secondary | ICD-10-CM | POA: Diagnosis not present

## 2022-08-26 DIAGNOSIS — Z0100 Encounter for examination of eyes and vision without abnormal findings: Secondary | ICD-10-CM | POA: Diagnosis not present

## 2022-08-26 DIAGNOSIS — H52223 Regular astigmatism, bilateral: Secondary | ICD-10-CM | POA: Diagnosis not present

## 2022-08-26 DIAGNOSIS — H5203 Hypermetropia, bilateral: Secondary | ICD-10-CM | POA: Diagnosis not present

## 2022-08-26 DIAGNOSIS — H4010X1 Unspecified open-angle glaucoma, mild stage: Secondary | ICD-10-CM | POA: Diagnosis not present

## 2022-09-08 ENCOUNTER — Other Ambulatory Visit: Payer: Self-pay

## 2022-09-08 MED ORDER — METOPROLOL SUCCINATE ER 50 MG PO TB24: 50.0000 mg | ORAL_TABLET | Freq: Every day | ORAL | 0 refills | Status: DC

## 2022-09-12 ENCOUNTER — Ambulatory Visit: Payer: Medicare HMO | Admitting: Internal Medicine

## 2022-09-12 ENCOUNTER — Telehealth: Payer: Self-pay

## 2022-09-12 NOTE — Progress Notes (Signed)
Care Management & Coordination Services Pharmacy Team  Reason for Encounter: Medication coordination and delivery  Contacted patient to discuss medications and coordinate delivery from [facility].  Spoke with family on 09/12/2022   Cycle dispensing form sent to Artelia Laroche for review.   Last adherence delivery date:08/24/22      Patient is due for next adherence delivery on: 09/22/22  This delivery to include: Adherence Packaging  30 Days  Levothyroxine 1 BB Atorvastatin 80mg  1 B Aspirin 81mg  1 B Metoprolol Succ ER 50mg  1 B Ferrous Sulfate 325mg  1 B Fexofenadine 180mg  1 B  Patient declined the following medications this month: [facility]- [facility]. [facility] did not have it in stock. LF 07/28/22 30ds Vitamin B12 1022mcg-D/C off medlsit. I am waiting on clarification from Dr. Sedalia Muta if this needs to be D/C off our list  Albuterol Inhaler 90 mcg -Only uses PRN. Does not need  No refill request needed.  Confirmed delivery date of 09/22/22, advised patient that pharmacy will contact them the morning of delivery.   Any concerns about your medications? No  How often do you forget or accidentally miss a dose? Never  Do you use a pillbox? No  Is patient in packaging Yes  If yes  What is the date on your next pill pack? Was not at home to confirm   Any concerns or issues with your packaging?No concerns    Recent blood pressure readings are as follows:08/03/22 110/60  Recent blood glucose readings are as follows:N/A   Chart review: Recent office visits:  None  Recent consult visits:  None  Hospital visits:  None  Medications: Outpatient Encounter Medications as of 09/12/2022  Medication Sig   acetaminophen (TYLENOL) 325 MG tablet Take 2 tablets (650 mg total) by mouth every 6 (six) hours as needed. (Patient taking differently: Take 650 mg by mouth every 6 (six) hours as needed for mild pain or moderate pain.)   albuterol (VENTOLIN HFA) 108 (90 Base) MCG/ACT  inhaler Inhale 2 puffs into the lungs every 6 (six) hours as needed for wheezing or shortness of breath.   aspirin 81 MG EC tablet Take 1 tablet (81 mg total) by mouth daily. Swallow whole.   atorvastatin (LIPITOR) 80 MG tablet Take 1 tablet (80 mg total) by mouth daily.   ferrous sulfate (FERROUSUL) 325 (65 FE) MG tablet Take 1 tablet (325 mg total) by mouth daily with breakfast.   ipratropium-albuterol (DUONEB) 0.5-2.5 (3) MG/3ML SOLN Take 3 mLs by nebulization every 4 (four) hours as needed (shortness of breath or wheezing).   levothyroxine (SYNTHROID) 175 MCG tablet TAKE ONE TABLET BY MOUTH BEFORE BREAKFAST   metoprolol succinate (TOPROL-XL) 50 MG 24 hr tablet Take 1 tablet (50 mg total) by mouth daily. TAKE WITH OR IMMEDIATELY FOLLOWING A MEAL.   Multiple Vitamins-Minerals (MULTIVITAMIN ADULT PO) Take 1 tablet by mouth daily. Unknown strength   nitrofurantoin, macrocrystal-monohydrate, (MACROBID) 100 MG capsule Take 1 capsule (100 mg total) by mouth 2 (two) times daily. (Patient not taking: Reported on 08/03/2022)   Nutritional Supplements (BOOST NUTRITIONAL ENERGY PO) Take 237 mLs by mouth in the morning and at bedtime. Drinking 1-2 per day   triamcinolone (KENALOG) 0.1 % Apply 1 application topically 2 (two) times daily as needed (rash). (Patient not taking: Reported on 08/03/2022)   No facility-administered encounter medications on file as of 09/12/2022.   BP Readings from Last 3 Encounters:  08/03/22 110/60  07/26/22 112/65  07/20/22 138/70    Pulse Readings  from Last 3 Encounters:  08/03/22 70  07/20/22 80  07/08/22 80    Lab Results  Component Value Date/Time   HGBA1C 6.3 (H) 06/22/2020 05:13 AM   Lab Results  Component Value Date   CREATININE 0.98 07/08/2022   BUN 16 07/08/2022   GFRNONAA 78 06/29/2020   GFRAA 90 06/29/2020   NA 134 07/08/2022   K 4.3 07/08/2022   CALCIUM 9.6 07/08/2022   CO2 22 07/08/2022     Roxana Hires, CMA Clinical Pharmacist Assistant   [phone]

## 2022-09-13 ENCOUNTER — Other Ambulatory Visit: Payer: Self-pay

## 2022-09-13 MED ORDER — ATORVASTATIN CALCIUM 80 MG PO TABS: 80.0000 mg | ORAL_TABLET | Freq: Every day | ORAL | 0 refills | Status: DC

## 2022-09-20 NOTE — Progress Notes (Cosign Needed)
Spoke with daughter today and advised that Dr. Sedalia Muta stated B12 was normal so no need to keep him on it and daughter understood and appreciated the call back.   Roxana Hires, CMA Clinical Pharmacist Assistant  [phone]

## 2022-09-30 ENCOUNTER — Encounter: Payer: Self-pay | Admitting: Cardiology

## 2022-09-30 ENCOUNTER — Ambulatory Visit: Payer: Medicare HMO | Attending: Cardiology | Admitting: Cardiology

## 2022-09-30 VITALS — BP 110/62 | HR 58 | Ht 72.0 in | Wt 133.6 lb

## 2022-09-30 DIAGNOSIS — J410 Simple chronic bronchitis: Secondary | ICD-10-CM

## 2022-09-30 DIAGNOSIS — I480 Paroxysmal atrial fibrillation: Secondary | ICD-10-CM | POA: Diagnosis not present

## 2022-09-30 DIAGNOSIS — I4729 Other ventricular tachycardia: Secondary | ICD-10-CM | POA: Diagnosis not present

## 2022-09-30 DIAGNOSIS — E782 Mixed hyperlipidemia: Secondary | ICD-10-CM | POA: Diagnosis not present

## 2022-09-30 NOTE — Patient Instructions (Signed)

## 2022-09-30 NOTE — Progress Notes (Signed)
Cardiology Office Note:    Date:  09/30/2022   ID:  James Albright Sr., DOB 20-Oct-1931, MRN [id number]  PCP:  Blane Ohara, MD  Cardiologist:  Gypsy Balsam, MD    Referring MD: Blane Ohara, MD   Chief Complaint  Patient presents with   Follow-up  Doing fine  History of Present Illness:    James Chihuahua Sr. is a 89 y.o. male past medical history significant for paroxysmal atrial fibrillation, no sustained ventricular tachycardia, sinus bradycardia, coronary artery disease.  He did have a cardiac catheterization which show some lesion in mid LAD 90% she and he end up having Rotablator staged procedure did well.  Have been following him for paroxysmal atrial fibrillation.  The issue is anticoagulation is very fragile anticoagulation has not been used.  I did refer him to our EP team for consideration of Watchman device but because of fragility and overall comorbidities we decided not to pursue this approach.  Overall he is doing well.  He denies have any chest pain tightness squeezing pressure burning chest.  Interestingly he is very excited today because [name] will be starting at his property and he apparently likes to go and swim there however his daughter telling me that he have not seen him for like 10 years.  No recent falls.  Past Medical History:  Diagnosis Date   Acquired hypothyroidism 08/15/2019   Acute systolic heart failure (HCC) 06/25/2020   Anemia 02/05/2020   boarderline anemic   Atopic dermatitis 12/05/2019   Bacterial pneumonia, unspecified 06/14/2012   06/14/2012 CXR w/ RML PNA >Zithromax and Rocephin     Bradycardia 11/28/2018   CAD (coronary artery disease) 07/07/2020   Cancer (HCC)    prostate cancer-seed implant   Closed fracture dislocation of right elbow    Closed fracture of right olecranon process 04/10/2020   COPD (chronic obstructive pulmonary disease) (HCC) 01/21/2020   Dermatitis 02/05/2020   Dizziness 02/19/2016   Essential hypertension 11/28/2018   History  of prostate cancer    seed implant   Hyperlipemia    Hyperlipidemia, mixed 08/21/2008   Qualifier: Diagnosis of  By: Maple Hudson MD, Clinton D    LVH (left ventricular hypertrophy) 11/28/2018   Malnutrition of moderate degree (HCC) 08/15/2019   Mixed conductive and sensorineural hearing loss, 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   Moderate persistent asthma, uncomplicated 01/21/2020   Nonsustained ventricular tachycardia (HCC) 01/30/2019   NSTEMI (non-ST elevated myocardial infarction) (HCC) 06/21/2020   Other fatigue 02/05/2020   PAF (paroxysmal atrial fibrillation) (HCC) 06/25/2020   Seasonal and perennial allergic rhinitis 05/17/2007       Senile osteoporosis 08/15/2019   SINUSITIS, ACUTE 06/28/2007   Qualifier: Diagnosis of  By: Clent Ridges NP, Tammy     Ventricular ectopy 11/28/2018    Past Surgical History:  Procedure Laterality Date   APPENDECTOMY     CORONARY ATHERECTOMY N/A 06/24/2020   Procedure: CORONARY ATHERECTOMY;  Surgeon: Iran Ouch, MD;  Location: [facility];  Service: Cardiovascular;  Laterality: N/A;   CORONARY ULTRASOUND/IVUS N/A 06/24/2020   Procedure: Intravascular Ultrasound/IVUS;  Surgeon: Iran Ouch, MD;  Location: [facility];  Service: Cardiovascular;  Laterality: N/A;   LEFT HEART CATH AND CORONARY ANGIOGRAPHY N/A 06/22/2020   Procedure: LEFT HEART CATH AND CORONARY ANGIOGRAPHY;  Surgeon: Yvonne Kendall, MD;  Location: [facility];  Service: Cardiovascular;  Laterality: N/A;   ORIF ELBOW FRACTURE Right 04/10/2020   Procedure: OPEN REDUCTION INTERNAL FIXATION (ORIF) ELBOW/OLECRANON FRACTURE;  Surgeon: Dion Saucier,  Ivin Booty, MD;  Location: [facility];  Service: Orthopedics;  Laterality: Right;    Current Medications: Current Meds  Medication Sig   acetaminophen (TYLENOL) 325 MG tablet Take 2 tablets (650 mg total) by mouth every 6 (six) hours as needed. (Patient taking differently: Take 650 mg by mouth every 6 (six) hours as needed for mild pain or  moderate pain.)   albuterol (VENTOLIN HFA) 108 (90 Base) MCG/ACT inhaler Inhale 2 puffs into the lungs every 6 (six) hours as needed for wheezing or shortness of breath.   aspirin 81 MG EC tablet Take 1 tablet (81 mg total) by mouth daily. Swallow whole.   atorvastatin (LIPITOR) 80 MG tablet Take 1 tablet (80 mg total) by mouth daily.   ferrous sulfate (FERROUSUL) 325 (65 FE) MG tablet Take 1 tablet (325 mg total) by mouth daily with breakfast.   ipratropium-albuterol (DUONEB) 0.5-2.5 (3) MG/3ML SOLN Take 3 mLs by nebulization every 4 (four) hours as needed (shortness of breath or wheezing).   levothyroxine (SYNTHROID) 175 MCG tablet TAKE ONE TABLET BY MOUTH BEFORE BREAKFAST (Patient taking differently: Take 175 mcg by mouth daily before breakfast.)   metoprolol succinate (TOPROL-XL) 50 MG 24 hr tablet Take 1 tablet (50 mg total) by mouth daily. TAKE WITH OR IMMEDIATELY FOLLOWING A MEAL.   Multiple Vitamins-Minerals (MULTIVITAMIN ADULT PO) Take 1 tablet by mouth daily. Unknown strength   Nutritional Supplements (BOOST NUTRITIONAL ENERGY PO) Take 237 mLs by mouth in the morning and at bedtime. Drinking 1-2 per day   triamcinolone (KENALOG) 0.1 % Apply 1 application  topically 2 (two) times daily as needed (rash).   [DISCONTINUED] nitrofurantoin, macrocrystal-monohydrate, (MACROBID) 100 MG capsule Take 1 capsule (100 mg total) by mouth 2 (two) times daily.     Allergies:   Clarithromycin   Social History   Socioeconomic History   Marital status: Married    Spouse name: Eber Jones   Number of children: Not on file   Years of education: Not on file   Highest education level: Not on file  Occupational History   Occupation: Retired    Associate Professor: OTHER    Comment: Engineer, civil (consulting)  Tobacco Use   Smoking status: Former    Types: Cigarettes    Quit date: 1960    Years since quitting: 64.4   Smokeless tobacco: Former    Types: Chew    Quit date: 2011  Vaping Use   Vaping Use: Never used   Substance and Sexual Activity   Alcohol use: No   Drug use: No   Sexual activity: Not Currently  Other Topics Concern   Not on file  Social History Narrative   Not on file   Social Determinants of Health   Financial Resource Strain: Low Risk  (03/30/2022)   Overall Financial Resource Strain (CARDIA)    Difficulty of Paying Living Expenses: Not hard at all  Food Insecurity: No Food Insecurity (01/29/2021)   Hunger Vital Sign    Worried About Running Out of Food in the Last Year: Never true    Ran Out of Food in the Last Year: Never true  Transportation Needs: No Transportation Needs (03/30/2022)   PRAPARE - Administrator, Civil Service (Medical): No    Lack of Transportation (Non-Medical): No  Physical Activity: Insufficiently Active (01/29/2021)   Exercise Vital Sign    Days of Exercise per Week: 7 days    Minutes of Exercise per Session: 10 min  Stress: No Stress Concern Present (  01/29/2021)   [employer] [employer] - Occupational Stress Questionnaire    Feeling of Stress : Not at all  Social Connections: Socially Integrated (01/29/2021)   Social Connection and Isolation Panel [NHANES]    Frequency of Communication with Friends and Family: Three times a week    Frequency of Social Gatherings with Friends and Family: Three times a week    Attends Religious Services: 1 to 4 times per year    Active Member of Clubs or Organizations: Yes    Attends Banker Meetings: 1 to 4 times per year    Marital Status: Married     Family History: The patient's family history includes Alzheimer's disease in his father; Diabetes in his mother. ROS:   Please see the history of present illness.    All 14 point review of systems negative except as described per history of present illness  EKGs/Labs/Other Studies Reviewed:      Recent Labs: 06/30/2022: NT-Pro BNP 810 07/08/2022: ALT 21; BUN 16; Creatinine, Ser 0.98; Hemoglobin 12.1; Platelets 209;  Potassium 4.3; Sodium 134; TSH 2.430  Recent Lipid Panel    Component Value Date/Time   CHOL 139 07/08/2022 1154   TRIG 93 07/08/2022 1154   HDL 57 07/08/2022 1154   CHOLHDL 2.4 07/08/2022 1154   LDLCALC 65 07/08/2022 1154    Physical Exam:    VS:  BP 110/62 (BP Location: Left Arm, Patient Position: Sitting)   Pulse (!) 58   Ht 6' (1.829 m)   Wt 133 lb 9.6 oz (60.6 kg)   SpO2 98%   BMI 18.12 kg/m     Wt Readings from Last 3 Encounters:  09/30/22 133 lb 9.6 oz (60.6 kg)  08/03/22 138 lb (62.6 kg)  07/20/22 137 lb (62.1 kg)     GEN:  Well nourished, well developed in no acute distress HEENT: Normal NECK: No JVD; No carotid bruits LYMPHATICS: No lymphadenopathy CARDIAC: RRR, no murmurs, no rubs, no gallops RESPIRATORY:  Clear to auscultation without rales, wheezing or rhonchi  ABDOMEN: Soft, non-tender, non-distended MUSCULOSKELETAL:  No edema; No deformity  SKIN: Warm and dry LOWER EXTREMITIES: no swelling NEUROLOGIC:  Alert and oriented x 3 PSYCHIATRIC:  Normal affect   ASSESSMENT:    1. PAF (paroxysmal atrial fibrillation) (HCC)   2. Nonsustained ventricular tachycardia (HCC)   3. Hyperlipidemia, mixed   4. Simple chronic bronchitis (HCC)    PLAN:    In order of problems listed above:  Paroxysmal atrial fibrillation advised her daughter to get Lourena Simmonds mobile so she can record his EKG and let us know if you develop episode of atrial fibrillation. Coronary disease stable from that point review continue present management. Dyslipidemia I did review K PN which show LDL 65 HDL 57 we will continue present management. COPD.  Noted.  Stable.   Medication Adjustments/Labs and Tests Ordered: Current medicines are reviewed at length with the patient today.  Concerns regarding medicines are outlined above.  No orders of the defined types were placed in this encounter.  Medication changes: No orders of the defined types were placed in this  encounter.   Signed, Georgeanna Lea, MD, [facility] 09/30/2022 11:11 AM    [facility]

## 2022-10-05 ENCOUNTER — Telehealth: Payer: Self-pay

## 2022-10-05 NOTE — Patient Outreach (Signed)
  Care Coordination   10/05/2022 Name: James Tinklenberg Sr. MRN: [id number] DOB: [birth year]   Care Coordination Outreach Attempts:  An unsuccessful telephone outreach was attempted today to offer the patient information about available care coordination services.  Follow Up Plan:  Additional outreach attempts will be made to offer the patient care coordination information and services.   Encounter Outcome:  No Answer   Care Coordination Interventions:  No, not indicated    Rowe Pavy, RN, BSN, [facility] [phone]

## 2022-10-08 NOTE — Progress Notes (Signed)
Subjective:    Patient ID: [name], male    DOB: Sep 17, 1931, 89 y.o.   MRN: [id number]  HPI  male never smoker followed for allergic rhinitis, mild intermittent asthma, history sinusitis, complicated by history prostate cancer, hyperlipidemia, hypothyroid, PAFIib/ Plavix ,NSTEM MI/ CAD/ Stents, HTN,  Dizziness, Anemia,   FENO 08/22/16- 19- WNL Office Spirometry 08/22/16- WNL- FVC 3.65/ 85%, FEV1 2.70-/ 89%, ratio 0.74, FEF 25-75% 2.03/ 98% ------------------------------------------------------------------------------------------  09/09/21-  89 year old male former smoker followed for allergic Rhinitis, mild Asthma, history Sinusitis, complicated by history Prostate Cancer, Hyperlipidemia, Hypothyroid, HTN, Bradycardia PAFIib/ Plavix ,NSTEM MI/ CAD/ Stents, HTN,  Dizziness, Anemia, Hypothyroid,  -Ventolin hfa, Neb Duoneb, Nasacort, Covid vax-     3 Phizer                                              Flu vax-had ACT score 20 He feels he is doing very well for having had his 89th birthday yesterday.  No significant respiratory events.  Daughter is here and she confirms.  He uses nebulizer machine a couple of times a week during the pollen season now if trying to work any outdoors.  Using rescue inhaler once or twice a week.  Cardiac status reported stable.  10/11/22-  89 year old male former smoker followed for allergic Rhinitis,  Asthma/ COPD, history Sinusitis, complicated by history Prostate Cancer, Hyperlipidemia, Hypothyroid, HTN, Bradycardia PAFIib/ Plavix ,NSTEM MI/ CAD/ Stents, HTN,  Dizziness, Anemia, Hypothyroid,  -Ventolin hfa, Neb Duoneb, Nasacort, Daughter are here with him. He is using his nebulizer machine about 3 times a week.  He does not feel he has any concerns or problems and feels comfortably controlled.  Medications discussed. No new cardiac events reported.  ROS-see HPI  + = positive Constitutional:   +  weight loss, night sweats, fevers, chills, fatigue,  lassitude. HEENT:   No-  headaches, difficulty swallowing, tooth/dental problems, sore throat,       No-  sneezing, itching, ear ache, +nasal congestion, +post nasal drip,  CV:  No-   chest pain, orthopnea, PND, swelling in lower extremities, anasarca, dizziness, palpitations Resp: + shortness of breath with exertion or at rest.               productive cough,   non-productive cough,  No- coughing up of blood.           change in color of mucus.  No- wheezing.   Skin: No-   rash or lesions. GI:  No-   heartburn, indigestion, abdominal pain, nausea, vomiting,  GU:  MS:  No-   joint pain or swelling.   Neuro-     nothing unusual Psych:  No- change in mood or affect. No depression or anxiety.  No memory loss.  OBJ- Physical Exam General- Alert, Oriented, Affect-appropriate, Distress- none acute. + Thin, talkative Skin- rash-none, lesions- none, excoriation- none Lymphadenopathy- none Head- atraumatic            Eyes- Gross vision intact, PERRLA, conjunctivae and secretions clear            Ears-  + very HOH, + hearing aids            Nose- Clear, no-Septal dev, mucus, polyps, erosion, perforation             Throat- Mallampati II , mucosa -clear, drainage-none seen, tonsils- atrophic,  Weak voice Neck- flexible , trachea midline, no stridor , thyroid nl, carotid no bruit Chest - symmetrical excursion , unlabored           Heart/CV- RRR/ rare skip , no murmur , no gallop  , no rub, nl s1 s2                           - JVD- none , edema- none, stasis changes- none, varices- none           Lung- +distant/unlabored, wheeze- none , dullness-none, rub- none           Chest wall-  Abd-  Br/ Gen/ Rectal- Not done, not indicated Extrem- + R forearm cast Neuro- grossly intact to observation

## 2022-10-11 ENCOUNTER — Encounter: Payer: Self-pay | Admitting: Internal Medicine

## 2022-10-11 ENCOUNTER — Ambulatory Visit: Payer: Medicare HMO | Admitting: Internal Medicine

## 2022-10-11 VITALS — BP 112/68 | HR 74 | Ht 73.0 in | Wt 134.2 lb

## 2022-10-11 DIAGNOSIS — J4489 Other specified chronic obstructive pulmonary disease: Secondary | ICD-10-CM

## 2022-10-11 DIAGNOSIS — I2510 Atherosclerotic heart disease of native coronary artery without angina pectoris: Secondary | ICD-10-CM

## 2022-10-11 NOTE — Patient Instructions (Signed)
Glad you are doing well ! We can continue current meds. Please call if we can help

## 2022-10-12 ENCOUNTER — Telehealth: Payer: Self-pay

## 2022-10-12 ENCOUNTER — Other Ambulatory Visit: Payer: Self-pay

## 2022-10-12 MED ORDER — FERROUS SULFATE 325 (65 FE) MG PO TABS: 325.0000 mg | ORAL_TABLET | Freq: Every day | ORAL | 12 refills | Status: DC

## 2022-10-12 MED ORDER — ALBUTEROL SULFATE HFA 108 (90 BASE) MCG/ACT IN AERS: 2.0000 | INHALATION_SPRAY | Freq: Four times a day (QID) | RESPIRATORY_TRACT | 12 refills | Status: DC | PRN

## 2022-10-12 NOTE — Progress Notes (Signed)
Care Management & Coordination Services Pharmacy Team  Reason for Encounter: Medication coordination and delivery  Contacted patient to discuss medications and coordinate delivery from [facility].  Spoke with family on 10/12/2022   Cycle dispensing form sent to [employer] for review.   Last adherence delivery date:09/22/22      Patient is due for next adherence delivery on: 10/24/22  This delivery to include: Adherence Packaging  30 Days  Levothyroxine 1 BB Atorvastatin 80mg  1 B Aspirin 81mg  1 B Metoprolol Succ ER 50mg  1 B Ferrous Sulfate 325mg  1 B Fexofenadine 180mg  1 B Albuterol Inhaler 90 mcg-Inhale 2 puffs into the lungs every 6 (six) hours as needed for wheezing or shortness of breath.   Patient declined the following medications this month: Duoneb- Gets [facility].  LF 07/28/22 30ds, uses prn  Vitamin B12 1063mcg-D/C by provider  Refills requested from providers include: Ferrous Sulfate 325mg  Albuterol Inhaler 90 mcg  Confirmed delivery date of 10/24/22, advised patient that pharmacy will contact them the morning of delivery.   Any concerns about your medications? No  How often do you forget or accidentally miss a dose? Never  Do you use a pillbox? No  Is patient in packaging Yes  If yes  Any concerns or issues with your packaging? No concern   Recent blood pressure readings are as follows: 10/11/22 112/68, 09/30/22 110/62  Recent blood glucose readings are as follows: N/A   Chart review: Recent office visits:  None  Recent consult visits:  10/11/22 Jetty Duhamel MD (Pulmonology). Seen for follow up. No med changes.   09/30/22 Gypsy Balsam MD. (Cardiology). Seen for follow up. No med changes.   Hospital visits:  None  Medications: Outpatient Encounter Medications as of 10/12/2022  Medication Sig   acetaminophen (TYLENOL) 325 MG tablet Take 2 tablets (650 mg total) by mouth every 6 (six) hours as needed. (Patient taking differently: Take 650  mg by mouth every 6 (six) hours as needed for mild pain or moderate pain.)   albuterol (VENTOLIN HFA) 108 (90 Base) MCG/ACT inhaler Inhale 2 puffs into the lungs every 6 (six) hours as needed for wheezing or shortness of breath.   aspirin 81 MG EC tablet Take 1 tablet (81 mg total) by mouth daily. Swallow whole.   atorvastatin (LIPITOR) 80 MG tablet Take 1 tablet (80 mg total) by mouth daily.   ferrous sulfate (FERROUSUL) 325 (65 FE) MG tablet Take 1 tablet (325 mg total) by mouth daily with breakfast.   ipratropium-albuterol (DUONEB) 0.5-2.5 (3) MG/3ML SOLN Take 3 mLs by nebulization every 4 (four) hours as needed (shortness of breath or wheezing).   levothyroxine (SYNTHROID) 175 MCG tablet TAKE ONE TABLET BY MOUTH BEFORE BREAKFAST (Patient taking differently: Take 175 mcg by mouth daily before breakfast.)   metoprolol succinate (TOPROL-XL) 50 MG 24 hr tablet Take 1 tablet (50 mg total) by mouth daily. TAKE WITH OR IMMEDIATELY FOLLOWING A MEAL.   Multiple Vitamins-Minerals (MULTIVITAMIN ADULT PO) Take 1 tablet by mouth daily. Unknown strength   Nutritional Supplements (BOOST NUTRITIONAL ENERGY PO) Take 237 mLs by mouth in the morning and at bedtime. Drinking 1-2 per day   triamcinolone (KENALOG) 0.1 % Apply 1 application  topically 2 (two) times daily as needed (rash). (Patient not taking: Reported on 10/11/2022)   No facility-administered encounter medications on file as of 10/12/2022.   BP Readings from Last 3 Encounters:  10/11/22 112/68  09/30/22 110/62  08/03/22 110/60    Pulse Readings from Last  3 Encounters:  10/11/22 74  09/30/22 (!) 58  08/03/22 70    Lab Results  Component Value Date/Time   HGBA1C 6.3 (H) 06/22/2020 05:13 AM   Lab Results  Component Value Date   CREATININE 0.98 07/08/2022   BUN 16 07/08/2022   GFRNONAA 78 06/29/2020   GFRAA 90 06/29/2020   NA 134 07/08/2022   K 4.3 07/08/2022   CALCIUM 9.6 07/08/2022   CO2 22 07/08/2022     Roxana Hires,  CMA Clinical Pharmacist Assistant  [phone]

## 2022-10-12 NOTE — Patient Outreach (Signed)
  Care Coordination   Initial Visit Note   10/12/2022 Name: James Montalbano Sr. MRN: [id number] DOB: 1931-06-04  James Albright Sr. is a 89 y.o. year old male who sees Cox, Kirsten, MD for primary care. I spoke with  James Albright Sr. by phone today. Placed call to patient today and spoke with [name] DPR.   What matters to the patients health and wellness today?  Placed call to patient today and spoke with daughter, [name]. Reviewed [facility] care coordination program and offer services. Daughter states that patient is doing very well and denies any current needs.     SDOH assessments and interventions completed:  No     Care Coordination Interventions:  Yes, provided   Interventions Today    Flowsheet Row Most Recent Value  Education Interventions   Education Provided Provided Education  [Provided my contact information if patient needs assistance in the future.]        Follow up plan: No further intervention required.   Encounter Outcome:  Pt. Refused   Rowe Pavy, RN, BSN, CEN [facility] [employer] [phone]

## 2022-11-02 ENCOUNTER — Ambulatory Visit: Payer: Medicare HMO | Admitting: Podiatry

## 2022-11-02 ENCOUNTER — Ambulatory Visit: Payer: Medicare HMO | Admitting: Physician Assistant

## 2022-11-02 DIAGNOSIS — L603 Nail dystrophy: Secondary | ICD-10-CM

## 2022-11-02 DIAGNOSIS — M79674 Pain in right toe(s): Secondary | ICD-10-CM

## 2022-11-02 DIAGNOSIS — M79675 Pain in left toe(s): Secondary | ICD-10-CM | POA: Diagnosis not present

## 2022-11-02 DIAGNOSIS — B351 Tinea unguium: Secondary | ICD-10-CM | POA: Diagnosis not present

## 2022-11-02 NOTE — Progress Notes (Signed)
    Subjective:  Patient ID: James Albright Sr., male    DOB: 16-Jul-1931,  MRN: [id number]  James Albright Sr. presents to clinic today for:  Chief Complaint  Patient presents with   Nail Problem    Possible nail fungus to bilateral feet. Right hallux nail is lifting from nail bed. Patient is not diabetic. Patient had a pedicure last week.   . Patient notes nails are thick, discolored, elongated and painful in shoegear when trying to ambulate.  His daughter wants with him today.  Patient is interested in treating the nails to improve the appearance and decrease the thickness.  PCP is Cox, Kirsten, MD.  Allergies  Allergen Reactions   Clarithromycin     REACTION: GI upset    Review of Systems: + thick, discolored toenails  Objective:  There were no vitals filed for this visit.  James Albright Sr. is a pleasant 89 y.o. male in NAD. AAO x 3.  Vascular Examination: Capillary refill time is 3-5 seconds to toes bilateral. Palpable pedal pulses b/l LE. Digital hair present b/l. No pedal edema b/l. Skin temperature gradient WNL b/l. No varicosities b/l. No cyanosis or clubbing noted b/l.   Dermatological Examination: Pedal skin with normal turgor, texture and tone b/l. No open wounds. No interdigital macerations b/l. Toenails x10 are 3mm thick, discolored, dystrophic with subungual debris. There is pain with compression of the nail plates.  They are elongated x10  Assessment/Plan: 1. Pain due to onychomycosis of toenails of both feet   2. Nail dystrophy    The mycotic toenails were sharply debrided with sterile nail nippers and a power debriding bur, with portions of the debrided hallux nail to be sent to [facility]  laboratories for fungal culture.  Informed them that it could take 2 to 4 weeks to receive the culture report back.  Will contact the patient and possibly get him started on topical therapy based on the culture results.  Discussed other treatment options including oral  medication which I would not recommend for this patient at this time.  Return in about 3 months (around 02/02/2023) for fungal nail recheck.   Clerance Lav, DPM, FACFAS [facility]     [address] [place], [place] [address]                Office [phone]  Fax [phone]

## 2022-11-05 ENCOUNTER — Encounter: Payer: Self-pay | Admitting: Podiatry

## 2022-11-17 ENCOUNTER — Telehealth: Payer: Self-pay

## 2022-11-17 ENCOUNTER — Other Ambulatory Visit: Payer: Self-pay | Admitting: Podiatry

## 2022-11-17 DIAGNOSIS — B351 Tinea unguium: Secondary | ICD-10-CM

## 2022-11-17 MED ORDER — TERBINAFINE HCL 250 MG PO TABS
250.0000 mg | ORAL_TABLET | Freq: Every day | ORAL | 0 refills | Status: DC
Start: 2022-11-17 — End: 2023-01-19

## 2022-11-17 NOTE — Progress Notes (Signed)
Rx terbinafine sent in for 6 weeks course.

## 2022-11-17 NOTE — Telephone Encounter (Signed)
-----   Message from Clerance Lav sent at 11/16/2022 12:45 PM EDT ----- His fungal culture came back + for a saprophytic mold and non-dermatophyte fungus, but not the "typical" fungus we usually see.  Oral is his best option (the oral terbinafine).  However, he is 89 years old, but his recent CMP is actually really good!  I think I'd only feel comfortable putting him on 6 weeks of oral terbinafine so we don't cause harm to the liver (instead of the standard 12 weeks for toenails) and see how he does.  Can you see if he's okay with this plan and let me know?  If so, I'll send in the 6 weeks of oral terbinafine that he'll take once daily until finished and I'll see him back in 3-4 months for recheck. Thank you!

## 2022-11-23 ENCOUNTER — Encounter: Payer: Self-pay | Admitting: Internal Medicine

## 2022-11-23 NOTE — Assessment & Plan Note (Signed)
No new cardiac events reported.  He is followed by cardiology.

## 2022-11-23 NOTE — Assessment & Plan Note (Signed)
Good control, using nebulizer about 3 times a week with infrequent need for rescue inhaler.  He feels stable and well-controlled. Plan-continue this therapy

## 2022-12-06 ENCOUNTER — Other Ambulatory Visit: Payer: Self-pay | Admitting: Family Medicine

## 2022-12-06 DIAGNOSIS — E039 Hypothyroidism, unspecified: Secondary | ICD-10-CM

## 2022-12-13 NOTE — Progress Notes (Unsigned)
Acute Office Visit  Subjective:    Patient ID: James Vercruysse Sr., male    DOB: 1932-03-21, 89 y.o.   MRN: [id number]  No chief complaint on file.   HPI: Patient is in today with complains of weakness in both of his knees.   Past Medical History:  Diagnosis Date   Acquired hypothyroidism 08/15/2019   Acute systolic heart failure (HCC) 06/25/2020   Anemia 02/05/2020   boarderline anemic   Atopic dermatitis 12/05/2019   Bacterial pneumonia, unspecified 06/14/2012   06/14/2012 CXR w/ RML PNA >Zithromax and Rocephin     Bradycardia 11/28/2018   CAD (coronary artery disease) 07/07/2020   Cancer (HCC)    prostate cancer-seed implant   Closed fracture dislocation of right elbow    Closed fracture of right olecranon process 04/10/2020   COPD (chronic obstructive pulmonary disease) (HCC) 01/21/2020   Dermatitis 02/05/2020   Dizziness 02/19/2016   Essential hypertension 11/28/2018   History of prostate cancer    seed implant   Hyperlipemia    Hyperlipidemia, mixed 08/21/2008   Qualifier: Diagnosis of  By: Maple Hudson MD, Clinton D    LVH (left ventricular hypertrophy) 11/28/2018   Malnutrition of moderate degree (HCC) 08/15/2019   Mixed conductive and sensorineural hearing loss, 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   Moderate persistent asthma, uncomplicated 01/21/2020   Nonsustained ventricular tachycardia (HCC) 01/30/2019   NSTEMI (non-ST elevated myocardial infarction) (HCC) 06/21/2020   Other fatigue 02/05/2020   PAF (paroxysmal atrial fibrillation) (HCC) 06/25/2020   Seasonal and perennial allergic rhinitis 05/17/2007       Senile osteoporosis 08/15/2019   SINUSITIS, ACUTE 06/28/2007   Qualifier: Diagnosis of  By: Clent Ridges NP, Tammy     Ventricular ectopy 11/28/2018    Past Surgical History:  Procedure Laterality Date   APPENDECTOMY     CORONARY ATHERECTOMY N/A 06/24/2020   Procedure: CORONARY ATHERECTOMY;  Surgeon: Iran Ouch, MD;  Location: [facility];  Service: Cardiovascular;  Laterality: N/A;    CORONARY ULTRASOUND/IVUS N/A 06/24/2020   Procedure: Intravascular Ultrasound/IVUS;  Surgeon: Iran Ouch, MD;  Location: [facility];  Service: Cardiovascular;  Laterality: N/A;   LEFT HEART CATH AND CORONARY ANGIOGRAPHY N/A 06/22/2020   Procedure: LEFT HEART CATH AND CORONARY ANGIOGRAPHY;  Surgeon: Yvonne Kendall, MD;  Location: [facility];  Service: Cardiovascular;  Laterality: N/A;   ORIF ELBOW FRACTURE Right 04/10/2020   Procedure: OPEN REDUCTION INTERNAL FIXATION (ORIF) ELBOW/OLECRANON FRACTURE;  Surgeon: Teryl Lucy, MD;  Location: [facility];  Service: Orthopedics;  Laterality: Right;    Family History  Problem Relation Age of Onset   Alzheimer's disease Father    Diabetes Mother     Social History   Socioeconomic History   Marital status: Married    Spouse name: Eber Jones   Number of children: Not on file   Years of education: Not on file   Highest education level: Not on file  Occupational History   Occupation: Retired    Associate Professor: OTHER    Comment: Engineer, civil (consulting)  Tobacco Use   Smoking status: Former    Current packs/day: 0.00    Types: Cigarettes    Quit date: 1960    Years since quitting: 64.6   Smokeless tobacco: Former    Types: Chew    Quit date: 2011  Vaping Use   Vaping status: Never Used  Substance and Sexual Activity   Alcohol use: No   Drug use: No   Sexual activity: Not Currently  Other Topics Concern   Not on file  Social History Narrative   Not on file   Social Determinants of Health   Financial Resource Strain: Low Risk  (03/30/2022)   Overall Financial Resource Strain (CARDIA)    Difficulty of Paying Living Expenses: Not hard at all  Food Insecurity: No Food Insecurity (01/29/2021)   Hunger Vital Sign    Worried About Running Out of Food in the Last Year: Never true    Ran Out of Food in the Last Year: Never true  Transportation Needs: No Transportation Needs (03/30/2022)   PRAPARE -  Administrator, Civil Service (Medical): No    Lack of Transportation (Non-Medical): No  Physical Activity: Insufficiently Active (01/29/2021)   Exercise Vital Sign    Days of Exercise per Week: 7 days    Minutes of Exercise per Session: 10 min  Stress: No Stress Concern Present (01/29/2021)   [employer] of Occupational Health - Occupational Stress Questionnaire    Feeling of Stress : Not at all  Social Connections: Socially Integrated (01/29/2021)   Social Connection and Isolation Panel [NHANES]    Frequency of Communication with Friends and Family: Three times a week    Frequency of Social Gatherings with Friends and Family: Three times a week    Attends Religious Services: 1 to 4 times per year    Active Member of Clubs or Organizations: Yes    Attends Banker Meetings: 1 to 4 times per year    Marital Status: Married  Catering manager Violence: Not At Risk (01/29/2021)   Humiliation, Afraid, Rape, and Kick questionnaire    Fear of Current or Ex-Partner: No    Emotionally Abused: No    Physically Abused: No    Sexually Abused: No    Outpatient Medications Prior to Visit  Medication Sig Dispense Refill   acetaminophen (TYLENOL) 325 MG tablet Take 2 tablets (650 mg total) by mouth every 6 (six) hours as needed. (Patient taking differently: Take 650 mg by mouth every 6 (six) hours as needed for mild pain or moderate pain.) 30 tablet 1   albuterol (VENTOLIN HFA) 108 (90 Base) MCG/ACT inhaler Inhale 2 puffs into the lungs every 6 (six) hours as needed for wheezing or shortness of breath. 18 each 12   aspirin 81 MG EC tablet Take 1 tablet (81 mg total) by mouth daily. Swallow whole. 90 tablet 3   atorvastatin (LIPITOR) 80 MG tablet Take 1 tablet (80 mg total) by mouth daily. 100 tablet 0   ferrous sulfate (FERROUSUL) 325 (65 FE) MG tablet Take 1 tablet (325 mg total) by mouth daily with breakfast. 30 tablet 12   ipratropium-albuterol (DUONEB) 0.5-2.5 (3)  MG/3ML SOLN Take 3 mLs by nebulization every 4 (four) hours as needed (shortness of breath or wheezing). 360 mL 2   levothyroxine (SYNTHROID) 175 MCG tablet TAKE ONE TABLET BY MOUTH BEFORE BREAKFAST 90 tablet 1   metoprolol succinate (TOPROL-XL) 50 MG 24 hr tablet Take 1 tablet (50 mg total) by mouth daily. TAKE WITH OR IMMEDIATELY FOLLOWING A MEAL. 90 tablet 0   Multiple Vitamins-Minerals (MULTIVITAMIN ADULT PO) Take 1 tablet by mouth daily. Unknown strength     Nutritional Supplements (BOOST NUTRITIONAL ENERGY PO) Take 237 mLs by mouth in the morning and at bedtime. Drinking 1-2 per day     terbinafine (LAMISIL) 250 MG tablet Take 1 tablet (250 mg total) by mouth daily. 45 tablet 0   triamcinolone (KENALOG) 0.1 %  Apply 1 application  topically 2 (two) times daily as needed (rash). (Patient not taking: Reported on 10/11/2022)     No facility-administered medications prior to visit.    Allergies  Allergen Reactions   Clarithromycin     REACTION: GI upset    Review of Systems     Objective:        10/11/2022    2:37 PM 09/30/2022   10:33 AM 08/03/2022   11:18 AM  Vitals with BMI  Height 6\' 1"  6\' 0"  6\' 0"   Weight 134 lbs 3 oz 133 lbs 10 oz 138 lbs  BMI 17.71 18.12 18.71  Systolic 112 110 295  Diastolic 68 62 60  Pulse 74 58 70    No data found.   Physical Exam  Health Maintenance Due  Topic Date Due   Zoster Vaccines- Shingrix (1 of 2) Never done   DTaP/Tdap/Td (2 - Td or Tdap) 07/31/2021   COVID-19 Vaccine (4 - 2023-24 season) 01/07/2022   INFLUENZA VACCINE  12/08/2022    There are no preventive care reminders to display for this patient.   Lab Results  Component Value Date   TSH 2.430 07/08/2022   Lab Results  Component Value Date   WBC 9.4 07/08/2022   HGB 12.1 (L) 07/08/2022   HCT 36.8 (L) 07/08/2022   MCV 96 07/08/2022   PLT 209 07/08/2022   Lab Results  Component Value Date   NA 134 07/08/2022   K 4.3 07/08/2022   CO2 22 07/08/2022   GLUCOSE 74  07/08/2022   BUN 16 07/08/2022   CREATININE 0.98 07/08/2022   BILITOT <0.2 07/08/2022   ALKPHOS 62 07/08/2022   AST 24 07/08/2022   ALT 21 07/08/2022   PROT 6.8 07/08/2022   ALBUMIN 4.4 07/08/2022   CALCIUM 9.6 07/08/2022   ANIONGAP 10 06/25/2020   EGFR 74 07/08/2022   Lab Results  Component Value Date   CHOL 139 07/08/2022   Lab Results  Component Value Date   HDL 57 07/08/2022   Lab Results  Component Value Date   LDLCALC 65 07/08/2022   Lab Results  Component Value Date   TRIG 93 07/08/2022   Lab Results  Component Value Date   CHOLHDL 2.4 07/08/2022   Lab Results  Component Value Date   HGBA1C 6.3 (H) 06/22/2020       Assessment & Plan:  There are no diagnoses linked to this encounter.   No orders of the defined types were placed in this encounter.   No orders of the defined types were placed in this encounter.    Follow-up: No follow-ups on file.  An After Visit Summary was printed and given to the patient.  Langley Gauss, [facility] [phone]

## 2022-12-14 ENCOUNTER — Encounter: Payer: Self-pay | Admitting: Physician Assistant

## 2022-12-14 ENCOUNTER — Ambulatory Visit (INDEPENDENT_AMBULATORY_CARE_PROVIDER_SITE_OTHER): Payer: Medicare HMO | Admitting: Physician Assistant

## 2022-12-14 VITALS — BP 122/68 | HR 72 | Resp 16 | Ht 72.0 in | Wt 128.2 lb

## 2022-12-14 DIAGNOSIS — W19XXXA Unspecified fall, initial encounter: Secondary | ICD-10-CM | POA: Diagnosis not present

## 2022-12-14 DIAGNOSIS — S80212A Abrasion, left knee, initial encounter: Secondary | ICD-10-CM | POA: Diagnosis not present

## 2022-12-14 DIAGNOSIS — Z9181 History of falling: Secondary | ICD-10-CM | POA: Insufficient documentation

## 2022-12-14 DIAGNOSIS — R296 Repeated falls: Secondary | ICD-10-CM | POA: Diagnosis not present

## 2022-12-14 DIAGNOSIS — M1711 Unilateral primary osteoarthritis, right knee: Secondary | ICD-10-CM | POA: Diagnosis not present

## 2022-12-14 DIAGNOSIS — M25562 Pain in left knee: Secondary | ICD-10-CM | POA: Diagnosis not present

## 2022-12-14 DIAGNOSIS — M25561 Pain in right knee: Secondary | ICD-10-CM | POA: Diagnosis not present

## 2022-12-14 NOTE — Assessment & Plan Note (Signed)
Sent for bilateral knee x-ray to rule out fracture due to history of Osteoporosis Will adjust treatment after results Take acetaminophen as needed for pain.

## 2022-12-15 ENCOUNTER — Inpatient Hospital Stay (HOSPITAL_COMMUNITY)
Admission: EM | Admit: 2022-12-15 | Discharge: 2023-01-19 | DRG: 085 | Disposition: A | Discharge status: Skilled Nursing Facility | Payer: Medicare HMO | Attending: Internal Medicine | Admitting: Internal Medicine

## 2022-12-15 ENCOUNTER — Emergency Department (HOSPITAL_COMMUNITY): Payer: Medicare HMO

## 2022-12-15 DIAGNOSIS — R296 Repeated falls: Secondary | ICD-10-CM | POA: Diagnosis present

## 2022-12-15 DIAGNOSIS — Z955 Presence of coronary angioplasty implant and graft: Secondary | ICD-10-CM

## 2022-12-15 DIAGNOSIS — R1310 Dysphagia, unspecified: Secondary | ICD-10-CM | POA: Diagnosis present

## 2022-12-15 DIAGNOSIS — B9620 Unspecified Escherichia coli [E. coli] as the cause of diseases classified elsewhere: Secondary | ICD-10-CM | POA: Diagnosis present

## 2022-12-15 DIAGNOSIS — I493 Ventricular premature depolarization: Secondary | ICD-10-CM | POA: Diagnosis not present

## 2022-12-15 DIAGNOSIS — R402342 Coma scale, best motor response, flexion withdrawal, at arrival to emergency department: Secondary | ICD-10-CM | POA: Diagnosis present

## 2022-12-15 DIAGNOSIS — W19XXXA Unspecified fall, initial encounter: Principal | ICD-10-CM

## 2022-12-15 DIAGNOSIS — J4540 Moderate persistent asthma, uncomplicated: Secondary | ICD-10-CM | POA: Diagnosis present

## 2022-12-15 DIAGNOSIS — R410 Disorientation, unspecified: Secondary | ICD-10-CM | POA: Diagnosis not present

## 2022-12-15 DIAGNOSIS — Z881 Allergy status to other antibiotic agents status: Secondary | ICD-10-CM

## 2022-12-15 DIAGNOSIS — S06300A Unspecified focal traumatic brain injury without loss of consciousness, initial encounter: Secondary | ICD-10-CM | POA: Diagnosis present

## 2022-12-15 DIAGNOSIS — I10 Essential (primary) hypertension: Secondary | ICD-10-CM | POA: Diagnosis present

## 2022-12-15 DIAGNOSIS — N401 Enlarged prostate with lower urinary tract symptoms: Secondary | ICD-10-CM | POA: Diagnosis present

## 2022-12-15 DIAGNOSIS — Z833 Family history of diabetes mellitus: Secondary | ICD-10-CM

## 2022-12-15 DIAGNOSIS — Z79899 Other long term (current) drug therapy: Secondary | ICD-10-CM

## 2022-12-15 DIAGNOSIS — Z1611 Resistance to penicillins: Secondary | ICD-10-CM | POA: Diagnosis present

## 2022-12-15 DIAGNOSIS — S199XXA Unspecified injury of neck, initial encounter: Secondary | ICD-10-CM | POA: Diagnosis not present

## 2022-12-15 DIAGNOSIS — R4701 Aphasia: Secondary | ICD-10-CM | POA: Diagnosis present

## 2022-12-15 DIAGNOSIS — I480 Paroxysmal atrial fibrillation: Secondary | ICD-10-CM | POA: Diagnosis present

## 2022-12-15 DIAGNOSIS — Z87891 Personal history of nicotine dependence: Secondary | ICD-10-CM

## 2022-12-15 DIAGNOSIS — M50323 Other cervical disc degeneration at C6-C7 level: Secondary | ICD-10-CM | POA: Diagnosis not present

## 2022-12-15 DIAGNOSIS — E039 Hypothyroidism, unspecified: Secondary | ICD-10-CM | POA: Diagnosis present

## 2022-12-15 DIAGNOSIS — E871 Hypo-osmolality and hyponatremia: Secondary | ICD-10-CM | POA: Diagnosis present

## 2022-12-15 DIAGNOSIS — Y92019 Unspecified place in single-family (private) house as the place of occurrence of the external cause: Secondary | ICD-10-CM

## 2022-12-15 DIAGNOSIS — J4489 Other specified chronic obstructive pulmonary disease: Secondary | ICD-10-CM | POA: Diagnosis present

## 2022-12-15 DIAGNOSIS — Z043 Encounter for examination and observation following other accident: Secondary | ICD-10-CM | POA: Diagnosis not present

## 2022-12-15 DIAGNOSIS — S066X0A Traumatic subarachnoid hemorrhage without loss of consciousness, initial encounter: Secondary | ICD-10-CM | POA: Diagnosis present

## 2022-12-15 DIAGNOSIS — S066XAA Traumatic subarachnoid hemorrhage with loss of consciousness status unknown, initial encounter: Secondary | ICD-10-CM

## 2022-12-15 DIAGNOSIS — R402242 Coma scale, best verbal response, confused conversation, at arrival to emergency department: Secondary | ICD-10-CM | POA: Diagnosis present

## 2022-12-15 DIAGNOSIS — K5900 Constipation, unspecified: Secondary | ICD-10-CM | POA: Diagnosis not present

## 2022-12-15 DIAGNOSIS — Z681 Body mass index (BMI) 19 or less, adult: Secondary | ICD-10-CM

## 2022-12-15 DIAGNOSIS — Z7982 Long term (current) use of aspirin: Secondary | ICD-10-CM

## 2022-12-15 DIAGNOSIS — S0990XA Unspecified injury of head, initial encounter: Secondary | ICD-10-CM | POA: Diagnosis not present

## 2022-12-15 DIAGNOSIS — R627 Adult failure to thrive: Secondary | ICD-10-CM | POA: Diagnosis present

## 2022-12-15 DIAGNOSIS — I5022 Chronic systolic (congestive) heart failure: Secondary | ICD-10-CM | POA: Diagnosis present

## 2022-12-15 DIAGNOSIS — Z9189 Other specified personal risk factors, not elsewhere classified: Secondary | ICD-10-CM

## 2022-12-15 DIAGNOSIS — D649 Anemia, unspecified: Secondary | ICD-10-CM | POA: Diagnosis present

## 2022-12-15 DIAGNOSIS — N390 Urinary tract infection, site not specified: Secondary | ICD-10-CM | POA: Diagnosis present

## 2022-12-15 DIAGNOSIS — D638 Anemia in other chronic diseases classified elsewhere: Secondary | ICD-10-CM | POA: Diagnosis present

## 2022-12-15 DIAGNOSIS — M4313 Spondylolisthesis, cervicothoracic region: Secondary | ICD-10-CM | POA: Diagnosis not present

## 2022-12-15 DIAGNOSIS — Z8546 Personal history of malignant neoplasm of prostate: Secondary | ICD-10-CM

## 2022-12-15 DIAGNOSIS — Z1152 Encounter for screening for COVID-19: Secondary | ICD-10-CM

## 2022-12-15 DIAGNOSIS — Z923 Personal history of irradiation: Secondary | ICD-10-CM

## 2022-12-15 DIAGNOSIS — I252 Old myocardial infarction: Secondary | ICD-10-CM

## 2022-12-15 DIAGNOSIS — E43 Unspecified severe protein-calorie malnutrition: Secondary | ICD-10-CM | POA: Diagnosis present

## 2022-12-15 DIAGNOSIS — S061X0A Traumatic cerebral edema without loss of consciousness, initial encounter: Secondary | ICD-10-CM | POA: Diagnosis not present

## 2022-12-15 DIAGNOSIS — R338 Other retention of urine: Secondary | ICD-10-CM | POA: Diagnosis present

## 2022-12-15 DIAGNOSIS — M810 Age-related osteoporosis without current pathological fracture: Secondary | ICD-10-CM | POA: Diagnosis present

## 2022-12-15 DIAGNOSIS — R9431 Abnormal electrocardiogram [ECG] [EKG]: Secondary | ICD-10-CM | POA: Diagnosis not present

## 2022-12-15 DIAGNOSIS — Z23 Encounter for immunization: Secondary | ICD-10-CM

## 2022-12-15 DIAGNOSIS — R6510 Systemic inflammatory response syndrome (SIRS) of non-infectious origin without acute organ dysfunction: Secondary | ICD-10-CM

## 2022-12-15 DIAGNOSIS — I2510 Atherosclerotic heart disease of native coronary artery without angina pectoris: Secondary | ICD-10-CM | POA: Diagnosis present

## 2022-12-15 DIAGNOSIS — I609 Nontraumatic subarachnoid hemorrhage, unspecified: Secondary | ICD-10-CM | POA: Diagnosis not present

## 2022-12-15 DIAGNOSIS — Z1623 Resistance to quinolones and fluoroquinolones: Secondary | ICD-10-CM | POA: Diagnosis present

## 2022-12-15 DIAGNOSIS — E782 Mixed hyperlipidemia: Secondary | ICD-10-CM | POA: Diagnosis present

## 2022-12-15 DIAGNOSIS — R54 Age-related physical debility: Secondary | ICD-10-CM | POA: Diagnosis present

## 2022-12-15 DIAGNOSIS — Z820 Family history of epilepsy and other diseases of the nervous system: Secondary | ICD-10-CM

## 2022-12-15 DIAGNOSIS — I255 Ischemic cardiomyopathy: Secondary | ICD-10-CM | POA: Diagnosis present

## 2022-12-15 DIAGNOSIS — H906 Mixed conductive and sensorineural hearing loss, bilateral: Secondary | ICD-10-CM | POA: Diagnosis present

## 2022-12-15 DIAGNOSIS — R339 Retention of urine, unspecified: Secondary | ICD-10-CM

## 2022-12-15 DIAGNOSIS — Z9181 History of falling: Secondary | ICD-10-CM

## 2022-12-15 DIAGNOSIS — R402142 Coma scale, eyes open, spontaneous, at arrival to emergency department: Secondary | ICD-10-CM | POA: Diagnosis present

## 2022-12-15 DIAGNOSIS — K9423 Gastrostomy malfunction: Secondary | ICD-10-CM | POA: Diagnosis not present

## 2022-12-15 DIAGNOSIS — Z79890 Hormone replacement therapy (postmenopausal): Secondary | ICD-10-CM

## 2022-12-15 DIAGNOSIS — G9341 Metabolic encephalopathy: Secondary | ICD-10-CM | POA: Diagnosis present

## 2022-12-15 DIAGNOSIS — Z781 Physical restraint status: Secondary | ICD-10-CM

## 2022-12-15 DIAGNOSIS — Z1619 Resistance to other specified beta lactam antibiotics: Secondary | ICD-10-CM | POA: Diagnosis present

## 2022-12-15 DIAGNOSIS — W1830XA Fall on same level, unspecified, initial encounter: Secondary | ICD-10-CM | POA: Diagnosis present

## 2022-12-15 DIAGNOSIS — C61 Malignant neoplasm of prostate: Secondary | ICD-10-CM | POA: Diagnosis not present

## 2022-12-15 DIAGNOSIS — E876 Hypokalemia: Secondary | ICD-10-CM | POA: Diagnosis not present

## 2022-12-15 DIAGNOSIS — Z743 Need for continuous supervision: Secondary | ICD-10-CM | POA: Diagnosis not present

## 2022-12-15 DIAGNOSIS — Z87440 Personal history of urinary (tract) infections: Secondary | ICD-10-CM

## 2022-12-15 DIAGNOSIS — R0902 Hypoxemia: Secondary | ICD-10-CM | POA: Diagnosis not present

## 2022-12-15 DIAGNOSIS — I110 Hypertensive heart disease with heart failure: Secondary | ICD-10-CM | POA: Diagnosis present

## 2022-12-15 LAB — I-STAT CHEM 8, ED
BUN: 12 mg/dL (ref 8–23)
Calcium, Ion: 1.16 mmol/L (ref 1.15–1.40)
Chloride: 99 mmol/L (ref 98–111)
Creatinine, Ser: 0.8 mg/dL (ref 0.61–1.24)
Glucose, Bld: 114 mg/dL — ABNORMAL HIGH (ref 70–99)
HCT: 37 % — ABNORMAL LOW (ref 39.0–52.0)
Hemoglobin: 12.6 g/dL — ABNORMAL LOW (ref 13.0–17.0)
Potassium: 3.6 mmol/L (ref 3.5–5.1)
Sodium: 134 mmol/L — ABNORMAL LOW (ref 135–145)
TCO2: 21 mmol/L — ABNORMAL LOW (ref 22–32)

## 2022-12-15 LAB — CBC
HCT: 35.9 % — ABNORMAL LOW (ref 39.0–52.0)
Hemoglobin: 11.6 g/dL — ABNORMAL LOW (ref 13.0–17.0)
MCH: 32.1 pg (ref 26.0–34.0)
MCHC: 32.3 g/dL (ref 30.0–36.0)
MCV: 99.4 fL (ref 80.0–100.0)
Platelets: 197 10*3/uL (ref 150–400)
RBC: 3.61 MIL/uL — ABNORMAL LOW (ref 4.22–5.81)
RDW: 12.2 % (ref 11.5–15.5)
WBC: 10.1 10*3/uL (ref 4.0–10.5)
nRBC: 0 % (ref 0.0–0.2)

## 2022-12-15 LAB — COMPREHENSIVE METABOLIC PANEL
ALT: 21 U/L (ref 0–44)
AST: 25 U/L (ref 15–41)
Albumin: 3.4 g/dL — ABNORMAL LOW (ref 3.5–5.0)
Alkaline Phosphatase: 49 U/L (ref 38–126)
Anion gap: 12 (ref 5–15)
BUN: 12 mg/dL (ref 8–23)
CO2: 23 mmol/L (ref 22–32)
Calcium: 9 mg/dL (ref 8.9–10.3)
Chloride: 99 mmol/L (ref 98–111)
Creatinine, Ser: 0.86 mg/dL (ref 0.61–1.24)
GFR, Estimated: >60 mL/min (ref >60)
Glucose, Bld: 118 mg/dL — ABNORMAL HIGH (ref 70–99)
Potassium: 3.7 mmol/L (ref 3.5–5.1)
Sodium: 134 mmol/L — ABNORMAL LOW (ref 135–145)
Total Bilirubin: 0.3 mg/dL (ref 0.3–1.2)
Total Protein: 6.5 g/dL (ref 6.5–8.1)

## 2022-12-15 LAB — ETHANOL: Alcohol, Ethyl (B): <10 mg/dL (ref <10)

## 2022-12-15 LAB — TYPE AND SCREEN
ABO/RH(D): A POS
Antibody Screen: NEGATIVE

## 2022-12-15 LAB — I-STAT CG4 LACTIC ACID, ED
Lactic Acid, Venous: 0.8 mmol/L (ref 0.5–1.9)
Lactic Acid, Venous: 1.8 mmol/L (ref 0.5–1.9)

## 2022-12-15 LAB — TROPONIN I (HIGH SENSITIVITY)
Troponin I (High Sensitivity): 9 ng/L (ref <18)
Troponin I (High Sensitivity): 10 ng/L (ref <18)

## 2022-12-15 LAB — ABO/RH: ABO/RH(D): A POS

## 2022-12-15 LAB — AMMONIA: Ammonia: <10 umol/L (ref 9–35)

## 2022-12-15 MED ORDER — LABETALOL HCL 5 MG/ML IV SOLN: 10.0000 mg | INTRAVENOUS | Status: DC | PRN

## 2022-12-15 MED ORDER — PROMETHAZINE HCL 25 MG PO TABS: 12.5000 mg | ORAL_TABLET | ORAL | Status: DC | PRN

## 2022-12-15 MED ORDER — DOCUSATE SODIUM 100 MG PO CAPS
100.0000 mg | ORAL_CAPSULE | Freq: Two times a day (BID) | ORAL | Status: DC
  Administered 2022-12-15 – 2022-12-18 (×2): 100 mg via ORAL
  Filled 2022-12-15 (×5): qty 1

## 2022-12-15 MED ORDER — ACETAMINOPHEN 325 MG PO TABS: 650.0000 mg | ORAL_TABLET | ORAL | Status: DC | PRN

## 2022-12-15 MED ORDER — LORAZEPAM 2 MG/ML IJ SOLN
0.5000 mg | Freq: Once | INTRAMUSCULAR | Status: AC
  Administered 2022-12-15: 0.5 mg via INTRAVENOUS
  Filled 2022-12-15: qty 1

## 2022-12-15 MED ORDER — ONDANSETRON HCL 4 MG/2ML IJ SOLN: 4.0000 mg | INTRAMUSCULAR | Status: DC | PRN

## 2022-12-15 MED ORDER — TETANUS-DIPHTH-ACELL PERTUSSIS 5-2.5-18.5 LF-MCG/0.5 IM SUSY
0.5000 mL | PREFILLED_SYRINGE | Freq: Once | INTRAMUSCULAR | Status: AC
  Administered 2022-12-15: 0.5 mL via INTRAMUSCULAR
  Filled 2022-12-15: qty 0.5

## 2022-12-15 MED ORDER — ONDANSETRON HCL 4 MG PO TABS: 4.0000 mg | ORAL_TABLET | ORAL | Status: DC | PRN

## 2022-12-15 MED ORDER — SODIUM CHLORIDE 0.9 % IV BOLUS
125.0000 mL | Freq: Once | INTRAVENOUS | Status: AC
  Administered 2022-12-15: 125 mL via INTRAVENOUS

## 2022-12-15 MED ORDER — FAMOTIDINE IN NACL 20-0.9 MG/50ML-% IV SOLN
20.0000 mg | Freq: Two times a day (BID) | INTRAVENOUS | Status: DC
  Administered 2022-12-15 – 2022-12-19 (×9): 20 mg via INTRAVENOUS
  Filled 2022-12-15 (×10): qty 50

## 2022-12-15 MED ORDER — POLYETHYLENE GLYCOL 3350 17 G PO PACK: 17.0000 g | PACK | Freq: Every day | ORAL | Status: DC | PRN

## 2022-12-15 MED ORDER — HYDROMORPHONE HCL 1 MG/ML IJ SOLN
0.5000 mg | INTRAMUSCULAR | Status: DC | PRN
  Administered 2022-12-15 – 2022-12-16 (×4): 0.5 mg via INTRAVENOUS
  Filled 2022-12-15 (×3): qty 1
  Filled 2022-12-15: qty 0.5

## 2022-12-15 MED ORDER — ACETAMINOPHEN 650 MG RE SUPP: 650.0000 mg | RECTAL | Status: DC | PRN

## 2022-12-15 MED ORDER — HYDROCODONE-ACETAMINOPHEN 5-325 MG PO TABS
1.0000 | ORAL_TABLET | ORAL | Status: DC | PRN
  Administered 2022-12-17: 1 via ORAL
  Filled 2022-12-15: qty 1

## 2022-12-15 NOTE — ED Triage Notes (Signed)
Patient [name] EMS from home after a mechanical fall.EMS reports he hit his head on the counter, unsure of LOC due to it being unwitnessed. Wife believes that the fall occurred around 5-5:30pm. He had an abrupt change in mental status, baseline he is alert and oriented x4 and can ambulate. EMS reports he is GCS of 14 on arrival to the ED he is GCS of 12. C-collar was not placed with EMS and was placed on arrival to ED.

## 2022-12-15 NOTE — TOC CAGE-AID Note (Signed)
Transition of Care [facility]) - CAGE-AID Screening   Patient Details  Name: James Hurd Sr. MRN: [id number] Date of Birth: 1931/09/19  Transition of Care [facility]) CM/SW Contact:    Katha Hamming, RN Phone Number: 12/15/2022, 8:03 PM    CAGE-AID Screening: Substance Abuse Screening unable to be completed due to: : Patient unable to participate (new onset of confusion, unable to complete screening)

## 2022-12-15 NOTE — ED Provider Notes (Signed)
[place] EMERGENCY DEPARTMENT AT [facility] Provider Note  MDM   HPI/ROS:  [name] is a 89 y.o. male presenting via EMS after fall with head trauma.  At around 1745 this afternoon, patient experienced unwitnessed ground-level fall and sustained significant head trauma.  Patient's daughter was at the house during this event, and claims that he became immediately altered afterwards.  Last known normal was just before the fall, and patient is normally very functional and takes care of his ADLs independently.  Per EMS, vitals remained stable and round and GCS waxed and waned between 12-14.  Upon arrival to the emergency department, patient is GCS 12.  Pleasantly confused, unable to answer questions beyond stating his name.  Mumbles incomprehensible responses to most questions and commands.  Physical exam is notable for: - Primary survey with intact airway, bilateral breath sounds, initial blood pressure of 127/72 - Secondary survey significant for approximately 3 cm abrasion to right eyebrow, hemostatic - GCS 12, ANO x 1.  Moving all 4 extremities  On my initial evaluation, patient is:  -Vital signs stable. Patient afebrile, hemodynamically stable, and non-toxic appearing. -Additional history obtained from EMS, family  Given the patient's history and physical exam, immediate concern for intracranial pathology.  Upon arrival to the emergency department, primary and secondary surveys conducted rapidly as c-collar was placed.  IV access established, and patient was taken immediately to the CT scanner for head and C-spine CT.  Interpretations, interventions, and the patient's course of care are documented below.    Broad toxic metabolic workup initiated.  Chest, pelvis x-ray taken as well as CT head, C-spine.  Given absence of other injuries on secondary survey, no further imaging ordered at this time.  Tdap administered.  Upon reassessment, patient remains resting comfortably.  No  change in mental status.  CT scan resulted with evidence of multifocal intracranial hemorrhage.  Neurosurgery consulted for recommendations.  Recommending to repeat CT scan in several hours.  Repeat CT taken at 2109 with no change in intracranial hemorrhages.  Upon reassessment, no change in mental status.  Patient remains ANO x 1, muttering incomprehensibly.  Still moving all 4 extremities, neurological exam unchanged.  Patient was posted for admission to neurosurgery service.  Please see their notes for further details.  Disposition:  I discussed the case with neurosurgery who graciously agreed to admit the patient to their service for continued care.   Clinical Impression:  1. Fall, initial encounter   2. Subarachnoid hemorrhage (HCC)     Rx / DC Orders ED Discharge Orders     None       The plan for this patient was discussed with Dr. Particia Nearing, who voiced agreement and who oversaw evaluation and treatment of this patient.   Clinical Complexity A medically appropriate history, review of systems, and physical exam was performed.  My independent interpretations of EKG, labs, and radiology are documented in the ED course above.   Click here for ABCD2, HEART and other calculatorsREFRESH Note before signing   Patient's presentation is most consistent with acute presentation with potential threat to life or bodily function.  Medical Decision Making Amount and/or Complexity of Data Reviewed Labs: ordered. Radiology: ordered.  Risk Prescription drug management. Decision regarding hospitalization.    HPI/ROS      See MDM section for pertinent HPI and ROS. A complete ROS was performed with pertinent positives/negatives noted above.   Past Medical History:  Diagnosis Date   Acquired hypothyroidism 08/15/2019   Acute  systolic heart failure (HCC) 06/25/2020   Anemia 02/05/2020   boarderline anemic   Atopic dermatitis 12/05/2019   Bacterial pneumonia, unspecified 06/14/2012    06/14/2012 CXR w/ RML PNA >Zithromax and Rocephin     Bradycardia 11/28/2018   CAD (coronary artery disease) 07/07/2020   Cancer (HCC)    prostate cancer-seed implant   Closed fracture dislocation of right elbow    Closed fracture of right olecranon process 04/10/2020   COPD (chronic obstructive pulmonary disease) (HCC) 01/21/2020   Dermatitis 02/05/2020   Dizziness 02/19/2016   Essential hypertension 11/28/2018   History of prostate cancer    seed implant   Hyperlipemia    Hyperlipidemia, mixed 08/21/2008   Qualifier: Diagnosis of  By: Maple Hudson MD, Clinton D    LVH (left ventricular hypertrophy) 11/28/2018   Malnutrition of moderate degree (HCC) 08/15/2019   Mixed conductive and sensorineural hearing loss, 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   Moderate persistent asthma, uncomplicated 01/21/2020   Nonsustained ventricular tachycardia (HCC) 01/30/2019   NSTEMI (non-ST elevated myocardial infarction) (HCC) 06/21/2020   Other fatigue 02/05/2020   PAF (paroxysmal atrial fibrillation) (HCC) 06/25/2020   Seasonal and perennial allergic rhinitis 05/17/2007       Senile osteoporosis 08/15/2019   SINUSITIS, ACUTE 06/28/2007   Qualifier: Diagnosis of  By: Clent Ridges NP, Tammy     Ventricular ectopy 11/28/2018    Past Surgical History:  Procedure Laterality Date   APPENDECTOMY     CORONARY ATHERECTOMY N/A 06/24/2020   Procedure: CORONARY ATHERECTOMY;  Surgeon: Iran Ouch, MD;  Location: [facility];  Service: Cardiovascular;  Laterality: N/A;   CORONARY ULTRASOUND/IVUS N/A 06/24/2020   Procedure: Intravascular Ultrasound/IVUS;  Surgeon: Iran Ouch, MD;  Location: [facility];  Service: Cardiovascular;  Laterality: N/A;   LEFT HEART CATH AND CORONARY ANGIOGRAPHY N/A 06/22/2020   Procedure: LEFT HEART CATH AND CORONARY ANGIOGRAPHY;  Surgeon: Yvonne Kendall, MD;  Location: [facility];  Service: Cardiovascular;  Laterality: N/A;   ORIF ELBOW FRACTURE Right 04/10/2020   Procedure: OPEN REDUCTION  INTERNAL FIXATION (ORIF) ELBOW/OLECRANON FRACTURE;  Surgeon: Teryl Lucy, MD;  Location: [facility];  Service: Orthopedics;  Laterality: Right;      Physical Exam   Vitals:   12/15/22 1855 12/15/22 2015 12/15/22 2352 12/16/22 0015  BP:  134/83    Pulse:  76  82  Resp:  (!) 26  (!) 9  Temp:   98.2 F (36.8 C)   TempSrc:   Oral   SpO2: 94% 100%  100%  Weight:      Height:        Physical Exam Vitals and nursing note reviewed.  Constitutional:      General: He is not in acute distress.    Appearance: He is well-developed.  HENT:     Head: Normocephalic.     Comments: Approximately 3 cm abrasion to right eyebrow.  Hemostatic Eyes:     Extraocular Movements: Extraocular movements intact.     Conjunctiva/sclera: Conjunctivae normal.     Pupils: Pupils are equal, round, and reactive to light.  Cardiovascular:     Rate and Rhythm: Normal rate and regular rhythm.     Heart sounds: No murmur heard. Pulmonary:     Effort: Pulmonary effort is normal. No respiratory distress.     Breath sounds: Normal breath sounds.  Abdominal:     Palpations: Abdomen is soft.     Tenderness: There is no abdominal tenderness.  Musculoskeletal:  General: No swelling.     Cervical back: Neck supple.  Skin:    General: Skin is warm and dry.     Capillary Refill: Capillary refill takes less than 2 seconds.  Neurological:     General: No focal deficit present.     Mental Status: He is alert. He is disoriented.     Cranial Nerves: No cranial nerve deficit.     Motor: No weakness.  Psychiatric:        Mood and Affect: Mood normal.    Starleen Arms, MD Department of Emergency Medicine   Please note that this documentation was produced with the assistance of voice-to-text technology and may contain errors.    Dyanne Iha, MD 12/16/22 Valentina Lucks    Jacalyn Lefevre, MD 12/20/22 1242

## 2022-12-15 NOTE — Progress Notes (Signed)
   12/15/22 1901  Spiritual Encounters  Type of Visit Initial  Care provided to: Patient  Conversation partners present during encounter Nurse  Referral source Trauma page  Reason for visit Trauma  OnCall Visit Yes  Advance Directives (For Healthcare)  Does Patient Have a Medical Advance Directive? No  Mental Health Advance Directives  Does Patient Have a Mental Health Advance Directive? No   Ch responded to trauma page. Pt's family is in the waiting area. Ch provided compassionate presence. No follow-up needed at this time.

## 2022-12-15 NOTE — Progress Notes (Signed)
Full note to follow. 89 y/o male presented after an unwitnessed ground level fall hitting his head around 1745 today. Patient's daughter was at the house during this event, and claims that he became immediately altered afterwards.  The patient is normally very functional and takes care of his ADLs independently. Upon arrival he is pleasantly confused, unable to answer questions beyond stating his name.  Mumbles incomprehensible responses to most questions and commands. He takes an ASA 81mg  Qday. Given that he is significantly altered I recommend he be admitted for observation, repeat CTH ordered for 6 hours after the fall. Hold ASA 81mg .   Emilee Hero, PA-C

## 2022-12-15 NOTE — Progress Notes (Signed)
Trauma Response Nurse Documentation   James Belmonte Sr. is a 89 y.o. male arriving to [facility] ED via EMS  On aspirin 81 mg daily. Trauma was activated as a Level 2 by EDP based on the following trauma criteria GCS 10-14 associated with trauma or AVPU < A.  Patient cleared for CT by Dr. Particia Nearing EDP. Pt transported to CT with trauma response nurse present to monitor. RN remained with the patient throughout their absence from the department for clinical observation.   GCS 12 ([id number]).  History   Past Medical History:  Diagnosis Date   Acquired hypothyroidism 08/15/2019   Acute systolic heart failure (HCC) 06/25/2020   Anemia 02/05/2020   boarderline anemic   Atopic dermatitis 12/05/2019   Bacterial pneumonia, unspecified 06/14/2012   06/14/2012 CXR w/ RML PNA >Zithromax and Rocephin     Bradycardia 11/28/2018   CAD (coronary artery disease) 07/07/2020   Cancer (HCC)    prostate cancer-seed implant   Closed fracture dislocation of right elbow    Closed fracture of right olecranon process 04/10/2020   COPD (chronic obstructive pulmonary disease) (HCC) 01/21/2020   Dermatitis 02/05/2020   Dizziness 02/19/2016   Essential hypertension 11/28/2018   History of prostate cancer    seed implant   Hyperlipemia    Hyperlipidemia, mixed 08/21/2008   Qualifier: Diagnosis of  By: Maple Hudson MD, Clinton D    LVH (left ventricular hypertrophy) 11/28/2018   Malnutrition of moderate degree (HCC) 08/15/2019   Mixed conductive and sensorineural hearing loss, 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   Moderate persistent asthma, uncomplicated 01/21/2020   Nonsustained ventricular tachycardia (HCC) 01/30/2019   NSTEMI (non-ST elevated myocardial infarction) (HCC) 06/21/2020   Other fatigue 02/05/2020   PAF (paroxysmal atrial fibrillation) (HCC) 06/25/2020   Seasonal and perennial allergic rhinitis 05/17/2007       Senile osteoporosis 08/15/2019   SINUSITIS, ACUTE 06/28/2007   Qualifier: Diagnosis of  By: Clent Ridges NP, Tammy     Ventricular  ectopy 11/28/2018     Past Surgical History:  Procedure Laterality Date   APPENDECTOMY     CORONARY ATHERECTOMY N/A 06/24/2020   Procedure: CORONARY ATHERECTOMY;  Surgeon: Iran Ouch, MD;  Location: [facility];  Service: Cardiovascular;  Laterality: N/A;   CORONARY ULTRASOUND/IVUS N/A 06/24/2020   Procedure: Intravascular Ultrasound/IVUS;  Surgeon: Iran Ouch, MD;  Location: [facility];  Service: Cardiovascular;  Laterality: N/A;   LEFT HEART CATH AND CORONARY ANGIOGRAPHY N/A 06/22/2020   Procedure: LEFT HEART CATH AND CORONARY ANGIOGRAPHY;  Surgeon: Yvonne Kendall, MD;  Location: [facility];  Service: Cardiovascular;  Laterality: N/A;   ORIF ELBOW FRACTURE Right 04/10/2020   Procedure: OPEN REDUCTION INTERNAL FIXATION (ORIF) ELBOW/OLECRANON FRACTURE;  Surgeon: Teryl Lucy, MD;  Location: [facility];  Service: Orthopedics;  Laterality: Right;       Initial Focused Assessment (If applicable, or please see trauma documentation): Alert/disoriented male presents via EMS from home after a fall in his kitchen with right eyebow laceration/right facial bruising/new onset confusion. Baseline alert x4 and independent.   1 inch laceration right forehead, bleeding controlled, bruising to right face. Older bruising to bilateral arms.  CT's Completed:   CT Head and CT C-Spine   Interventions:  IV start and trauma lab draw [place] J collar CT head/cervical spine Portable chest/pelvis XRAY TDAP EKG Family presence Wound care/lac repair  Plan for disposition:  Admit  Consults completed:  Automotive engineer at [place], page returned at 1945.  Event Summary:  Presents via EMS from home after a fall in the kitchen, EMS reports hitting his head on the counter. Family waited approx 30 minutes prior to calling 911. Acute neuro status change, pt baseline alert x4 and independent, currently GCS 12, unable to follow commands or report his name.  Approx 1 inch laceration above right eyebrow, bleeding controlled. Wound cleaned, anticipate sutures. [place] J collar applied on arrival, no EMS collar in place. Delay in portable XRAYS d/t neuro status change, prioritized CT. Escorted to CT by TRN, XRAYS completed after. Family brought to bedside. Pt attempting to remove medical monitoring, get out pf bed - daughter assisting in redirecting. TDAP. CT with scattered SAH, neurosurg consult, admit.   Bedside handoff with ED RN [facility].     O   Trauma Response RN  Please call TRN at [phone] for further assistance.

## 2022-12-15 NOTE — ED Notes (Signed)
Patient transported to CT on monitor with TRN

## 2022-12-16 ENCOUNTER — Other Ambulatory Visit: Payer: Self-pay

## 2022-12-16 ENCOUNTER — Encounter (HOSPITAL_COMMUNITY): Payer: Self-pay | Admitting: Neurological Surgery

## 2022-12-16 ENCOUNTER — Other Ambulatory Visit: Payer: Self-pay | Admitting: Family Medicine

## 2022-12-16 DIAGNOSIS — D638 Anemia in other chronic diseases classified elsewhere: Secondary | ICD-10-CM | POA: Diagnosis not present

## 2022-12-16 DIAGNOSIS — I700 Atherosclerosis of aorta: Secondary | ICD-10-CM | POA: Diagnosis not present

## 2022-12-16 DIAGNOSIS — D649 Anemia, unspecified: Secondary | ICD-10-CM | POA: Diagnosis not present

## 2022-12-16 DIAGNOSIS — I2510 Atherosclerotic heart disease of native coronary artery without angina pectoris: Secondary | ICD-10-CM | POA: Diagnosis not present

## 2022-12-16 DIAGNOSIS — I493 Ventricular premature depolarization: Secondary | ICD-10-CM | POA: Diagnosis not present

## 2022-12-16 DIAGNOSIS — I4891 Unspecified atrial fibrillation: Secondary | ICD-10-CM | POA: Diagnosis not present

## 2022-12-16 DIAGNOSIS — G936 Cerebral edema: Secondary | ICD-10-CM | POA: Diagnosis not present

## 2022-12-16 DIAGNOSIS — G9341 Metabolic encephalopathy: Secondary | ICD-10-CM | POA: Diagnosis not present

## 2022-12-16 DIAGNOSIS — Z23 Encounter for immunization: Secondary | ICD-10-CM | POA: Diagnosis not present

## 2022-12-16 DIAGNOSIS — J9811 Atelectasis: Secondary | ICD-10-CM | POA: Diagnosis not present

## 2022-12-16 DIAGNOSIS — S061X0A Traumatic cerebral edema without loss of consciousness, initial encounter: Secondary | ICD-10-CM | POA: Diagnosis not present

## 2022-12-16 DIAGNOSIS — R1319 Other dysphagia: Secondary | ICD-10-CM | POA: Diagnosis not present

## 2022-12-16 DIAGNOSIS — R296 Repeated falls: Secondary | ICD-10-CM | POA: Diagnosis not present

## 2022-12-16 DIAGNOSIS — S066X0A Traumatic subarachnoid hemorrhage without loss of consciousness, initial encounter: Secondary | ICD-10-CM | POA: Diagnosis not present

## 2022-12-16 DIAGNOSIS — I6782 Cerebral ischemia: Secondary | ICD-10-CM | POA: Diagnosis not present

## 2022-12-16 DIAGNOSIS — E43 Unspecified severe protein-calorie malnutrition: Secondary | ICD-10-CM | POA: Diagnosis not present

## 2022-12-16 DIAGNOSIS — J4540 Moderate persistent asthma, uncomplicated: Secondary | ICD-10-CM | POA: Diagnosis not present

## 2022-12-16 DIAGNOSIS — I6200 Nontraumatic subdural hemorrhage, unspecified: Secondary | ICD-10-CM | POA: Diagnosis not present

## 2022-12-16 DIAGNOSIS — W1830XA Fall on same level, unspecified, initial encounter: Secondary | ICD-10-CM | POA: Diagnosis not present

## 2022-12-16 DIAGNOSIS — S06300A Unspecified focal traumatic brain injury without loss of consciousness, initial encounter: Secondary | ICD-10-CM | POA: Diagnosis present

## 2022-12-16 DIAGNOSIS — R509 Fever, unspecified: Secondary | ICD-10-CM | POA: Diagnosis not present

## 2022-12-16 DIAGNOSIS — Y92019 Unspecified place in single-family (private) house as the place of occurrence of the external cause: Secondary | ICD-10-CM | POA: Diagnosis not present

## 2022-12-16 DIAGNOSIS — Z955 Presence of coronary angioplasty implant and graft: Secondary | ICD-10-CM | POA: Diagnosis not present

## 2022-12-16 DIAGNOSIS — R339 Retention of urine, unspecified: Secondary | ICD-10-CM | POA: Diagnosis not present

## 2022-12-16 DIAGNOSIS — I609 Nontraumatic subarachnoid hemorrhage, unspecified: Secondary | ICD-10-CM | POA: Diagnosis not present

## 2022-12-16 DIAGNOSIS — R369 Urethral discharge, unspecified: Secondary | ICD-10-CM | POA: Diagnosis not present

## 2022-12-16 DIAGNOSIS — Z1152 Encounter for screening for COVID-19: Secondary | ICD-10-CM | POA: Diagnosis not present

## 2022-12-16 DIAGNOSIS — E876 Hypokalemia: Secondary | ICD-10-CM | POA: Diagnosis not present

## 2022-12-16 DIAGNOSIS — R059 Cough, unspecified: Secondary | ICD-10-CM | POA: Diagnosis not present

## 2022-12-16 DIAGNOSIS — Z1623 Resistance to quinolones and fluoroquinolones: Secondary | ICD-10-CM | POA: Diagnosis not present

## 2022-12-16 DIAGNOSIS — Z1611 Resistance to penicillins: Secondary | ICD-10-CM | POA: Diagnosis not present

## 2022-12-16 DIAGNOSIS — Z4682 Encounter for fitting and adjustment of non-vascular catheter: Secondary | ICD-10-CM | POA: Diagnosis not present

## 2022-12-16 DIAGNOSIS — I10 Essential (primary) hypertension: Secondary | ICD-10-CM | POA: Diagnosis not present

## 2022-12-16 DIAGNOSIS — N390 Urinary tract infection, site not specified: Secondary | ICD-10-CM | POA: Diagnosis not present

## 2022-12-16 DIAGNOSIS — Z681 Body mass index (BMI) 19 or less, adult: Secondary | ICD-10-CM | POA: Diagnosis not present

## 2022-12-16 DIAGNOSIS — Z515 Encounter for palliative care: Secondary | ICD-10-CM | POA: Diagnosis not present

## 2022-12-16 DIAGNOSIS — Z1619 Resistance to other specified beta lactam antibiotics: Secondary | ICD-10-CM | POA: Diagnosis not present

## 2022-12-16 DIAGNOSIS — Z4659 Encounter for fitting and adjustment of other gastrointestinal appliance and device: Secondary | ICD-10-CM | POA: Diagnosis not present

## 2022-12-16 DIAGNOSIS — E871 Hypo-osmolality and hyponatremia: Secondary | ICD-10-CM | POA: Diagnosis not present

## 2022-12-16 DIAGNOSIS — K8020 Calculus of gallbladder without cholecystitis without obstruction: Secondary | ICD-10-CM | POA: Diagnosis not present

## 2022-12-16 DIAGNOSIS — Z9181 History of falling: Secondary | ICD-10-CM | POA: Diagnosis not present

## 2022-12-16 DIAGNOSIS — K9423 Gastrostomy malfunction: Secondary | ICD-10-CM | POA: Diagnosis not present

## 2022-12-16 DIAGNOSIS — B9620 Unspecified Escherichia coli [E. coli] as the cause of diseases classified elsewhere: Secondary | ICD-10-CM | POA: Diagnosis not present

## 2022-12-16 DIAGNOSIS — M129 Arthropathy, unspecified: Secondary | ICD-10-CM | POA: Diagnosis not present

## 2022-12-16 DIAGNOSIS — R4701 Aphasia: Secondary | ICD-10-CM | POA: Diagnosis not present

## 2022-12-16 DIAGNOSIS — G238 Other specified degenerative diseases of basal ganglia: Secondary | ICD-10-CM | POA: Diagnosis not present

## 2022-12-16 DIAGNOSIS — N1339 Other hydronephrosis: Secondary | ICD-10-CM | POA: Diagnosis not present

## 2022-12-16 DIAGNOSIS — R6510 Systemic inflammatory response syndrome (SIRS) of non-infectious origin without acute organ dysfunction: Secondary | ICD-10-CM | POA: Diagnosis not present

## 2022-12-16 DIAGNOSIS — S066XAA Traumatic subarachnoid hemorrhage with loss of consciousness status unknown, initial encounter: Secondary | ICD-10-CM | POA: Diagnosis not present

## 2022-12-16 DIAGNOSIS — D72829 Elevated white blood cell count, unspecified: Secondary | ICD-10-CM | POA: Diagnosis not present

## 2022-12-16 DIAGNOSIS — J4489 Other specified chronic obstructive pulmonary disease: Secondary | ICD-10-CM | POA: Diagnosis not present

## 2022-12-16 DIAGNOSIS — I480 Paroxysmal atrial fibrillation: Secondary | ICD-10-CM | POA: Diagnosis not present

## 2022-12-16 DIAGNOSIS — R310 Gross hematuria: Secondary | ICD-10-CM | POA: Diagnosis not present

## 2022-12-16 DIAGNOSIS — Z01818 Encounter for other preprocedural examination: Secondary | ICD-10-CM | POA: Diagnosis not present

## 2022-12-16 DIAGNOSIS — Z9189 Other specified personal risk factors, not elsewhere classified: Secondary | ICD-10-CM | POA: Diagnosis not present

## 2022-12-16 DIAGNOSIS — R627 Adult failure to thrive: Secondary | ICD-10-CM | POA: Diagnosis not present

## 2022-12-16 DIAGNOSIS — E039 Hypothyroidism, unspecified: Secondary | ICD-10-CM | POA: Diagnosis not present

## 2022-12-16 DIAGNOSIS — Z7189 Other specified counseling: Secondary | ICD-10-CM | POA: Diagnosis not present

## 2022-12-16 DIAGNOSIS — C61 Malignant neoplasm of prostate: Secondary | ICD-10-CM | POA: Diagnosis not present

## 2022-12-16 DIAGNOSIS — I110 Hypertensive heart disease with heart failure: Secondary | ICD-10-CM | POA: Diagnosis not present

## 2022-12-16 DIAGNOSIS — I5022 Chronic systolic (congestive) heart failure: Secondary | ICD-10-CM | POA: Diagnosis not present

## 2022-12-16 DIAGNOSIS — R54 Age-related physical debility: Secondary | ICD-10-CM | POA: Diagnosis not present

## 2022-12-16 NOTE — ED Notes (Signed)
ED TO INPATIENT HANDOFF REPORT  ED Nurse Name and Phone #: Marchelle Folks [phone]  S Name/Age/Gender James Albright Sr. 89 y.o. male Room/Bed: 009C/009C  Code Status   Code Status: Full Code  Home/SNF/Other Home Patient oriented to: self Is this baseline? No   Triage Complete: Triage complete  Chief Complaint Subarachnoid hemorrhage [facility]) [I60.9]  Triage Note Patient bib [facility] from home after a mechanical fall.EMS reports he hit his head on the counter, unsure of LOC due to it being unwitnessed. Wife believes that the fall occurred around 5-5:30pm. He had an abrupt change in mental status, baseline he is alert and oriented x4 and can ambulate. EMS reports he is GCS of 14 on arrival to the ED he is GCS of 12. C-collar was not placed with EMS and was placed on arrival to ED.    Allergies Allergies  Allergen Reactions   Clarithromycin     REACTION: GI upset    Level of Care/Admitting Diagnosis ED Disposition     ED Disposition  Admit   Condition  --   Comment  Hospital Area: [facility] [[id number]]  Level of Care: Progressive [102]  Admit to Progressive based on following criteria: NEUROLOGICAL AND NEUROSURGICAL complex patients with significant risk of instability, who do not meet ICU criteria, yet require close observation or frequent assessment (< / = every 2 - 4 hours) with medical / nursing intervention.  May place patient in observation at [facility] or [facility] if equivalent level of care is available:: No  Covid Evaluation: Asymptomatic - no recent exposure (last 10 days) testing not required  Diagnosis: Subarachnoid hemorrhage (HCC) [430.ICD-9-CM]  Admitting Physician: Jadene Pierini [[id number]]  Attending Physician: Autumn Patty A [[id number]]  Bed request comments: 4NP          B Medical/Surgery History Past Medical History:  Diagnosis Date   Acquired hypothyroidism 08/15/2019   Acute systolic heart failure (HCC) 06/25/2020    Anemia 02/05/2020   boarderline anemic   Atopic dermatitis 12/05/2019   Bacterial pneumonia, unspecified 06/14/2012   06/14/2012 CXR w/ RML PNA >Zithromax and Rocephin     Bradycardia 11/28/2018   CAD (coronary artery disease) 07/07/2020   Cancer (HCC)    prostate cancer-seed implant   Closed fracture dislocation of right elbow    Closed fracture of right olecranon process 04/10/2020   COPD (chronic obstructive pulmonary disease) (HCC) 01/21/2020   Dermatitis 02/05/2020   Dizziness 02/19/2016   Essential hypertension 11/28/2018   History of prostate cancer    seed implant   Hyperlipemia    Hyperlipidemia, mixed 08/21/2008   Qualifier: Diagnosis of  By: Maple Hudson MD, Clinton D    LVH (left ventricular hypertrophy) 11/28/2018   Malnutrition of moderate degree (HCC) 08/15/2019   Mixed conductive and sensorineural hearing loss, 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   Moderate persistent asthma, uncomplicated 01/21/2020   Nonsustained ventricular tachycardia (HCC) 01/30/2019   NSTEMI (non-ST elevated myocardial infarction) (HCC) 06/21/2020   Other fatigue 02/05/2020   PAF (paroxysmal atrial fibrillation) (HCC) 06/25/2020   Seasonal and perennial allergic rhinitis 05/17/2007       Senile osteoporosis 08/15/2019   SINUSITIS, ACUTE 06/28/2007   Qualifier: Diagnosis of  By: Clent Ridges NP, Tammy     Ventricular ectopy 11/28/2018   Past Surgical History:  Procedure Laterality Date   APPENDECTOMY     CORONARY ATHERECTOMY N/A 06/24/2020   Procedure: CORONARY ATHERECTOMY;  Surgeon: Iran Ouch, MD;  Location: [facility] INVASIVE CV LAB;  Service: Cardiovascular;  Laterality: N/A;   CORONARY ULTRASOUND/IVUS N/A 06/24/2020   Procedure: Intravascular Ultrasound/IVUS;  Surgeon: Iran Ouch, MD;  Location: [facility] INVASIVE CV LAB;  Service: Cardiovascular;  Laterality: N/A;   LEFT HEART CATH AND CORONARY ANGIOGRAPHY N/A 06/22/2020   Procedure: LEFT HEART CATH AND CORONARY ANGIOGRAPHY;  Surgeon: Yvonne Kendall, MD;  Location: [facility] INVASIVE CV LAB;   Service: Cardiovascular;  Laterality: N/A;   ORIF ELBOW FRACTURE Right 04/10/2020   Procedure: OPEN REDUCTION INTERNAL FIXATION (ORIF) ELBOW/OLECRANON FRACTURE;  Surgeon: Teryl Lucy, MD;  Location: [facility];  Service: Orthopedics;  Laterality: Right;     A IV Location/Drains/Wounds Patient Lines/Drains/Airways Status     Active Line/Drains/Airways     Name Placement date Placement time Site Days   Peripheral IV 12/15/22 20 G Anterior;Distal;Left;Upper Arm 12/15/22  1837  Arm  1   Peripheral IV 12/15/22 20 G Anterior;Right Forearm 12/15/22  1853  Forearm  1   Peripheral IV 12/15/22 20 G Anterior;Left;Upper Arm 12/15/22  2248  Arm  1   Incision (Closed) 04/10/20 Elbow Right 04/10/20  1520  -- 980            Intake/Output Last 24 hours  Intake/Output Summary (Last 24 hours) at 12/16/2022 1004 Last data filed at 12/15/2022 2342 Gross per 24 hour  Intake 60.56 ml  Output --  Net 60.56 ml    Labs/Imaging Results for orders placed or performed during the hospital encounter of 12/15/22 (from the past 48 hour(s))  Comprehensive metabolic panel     Status: Abnormal   Collection Time: 12/15/22  6:40 PM  Result Value Ref Range   Sodium 134 (L) 135 - 145 mmol/L   Potassium 3.7 3.5 - 5.1 mmol/L   Chloride 99 98 - 111 mmol/L   CO2 23 22 - 32 mmol/L   Glucose, Bld 118 (H) 70 - 99 mg/dL    Comment: Glucose reference range applies only to samples taken after fasting for at least 8 hours.   BUN 12 8 - 23 mg/dL   Creatinine, Ser 5.40 0.61 - 1.24 mg/dL   Calcium 9.0 8.9 - 98.1 mg/dL   Total Protein 6.5 6.5 - 8.1 g/dL   Albumin 3.4 (L) 3.5 - 5.0 g/dL   AST 25 15 - 41 U/L   ALT 21 0 - 44 U/L   Alkaline Phosphatase 49 38 - 126 U/L   Total Bilirubin 0.3 0.3 - 1.2 mg/dL   GFR, Estimated >19 >14 mL/min    Comment: (NOTE) Calculated using the CKD-EPI Creatinine Equation (2021)    Anion gap 12 5 - 15    Comment: Performed at [facility], [address],  [place], [place] [address]  CBC     Status: Abnormal   Collection Time: 12/15/22  6:40 PM  Result Value Ref Range   WBC 10.1 4.0 - 10.5 K/uL   RBC 3.61 (L) 4.22 - 5.81 MIL/uL   Hemoglobin 11.6 (L) 13.0 - 17.0 g/dL   HCT 62.1 (L) 30.8 - 65.7 %   MCV 99.4 80.0 - 100.0 fL   MCH 32.1 26.0 - 34.0 pg   MCHC 32.3 30.0 - 36.0 g/dL   RDW 84.6 96.2 - 95.2 %   Platelets 197 150 - 400 K/uL   nRBC 0.0 0.0 - 0.2 %    Comment: Performed at [facility], [address], [place], [place] [address]  Ethanol     Status: None   Collection Time: 12/15/22  6:40  PM  Result Value Ref Range   Alcohol, Ethyl (B) <10 <10 mg/dL    Comment: (NOTE) Lowest detectable limit for serum alcohol is 10 mg/dL.  For medical purposes only. Performed at [facility], [address], [place], [place] [address]   Troponin I (High Sensitivity)     Status: None   Collection Time: 12/15/22  6:40 PM  Result Value Ref Range   Troponin I (High Sensitivity) 9 <18 ng/L    Comment: (NOTE) Elevated high sensitivity troponin I (hsTnI) values and significant  changes across serial measurements may suggest ACS but many other  chronic and acute conditions are known to elevate hsTnI results.  Refer to the "Links" section for chest pain algorithms and additional  guidance. Performed at [facility], [address], [place], [place] [address]   Ammonia     Status: None   Collection Time: 12/15/22  6:40 PM  Result Value Ref Range   Ammonia <10 9 - 35 umol/L    Comment: Performed at [facility], [address] [place], [place] [address]  I-stat chem 8, ED (not at [facility], [facility] or [facility])     Status: Abnormal   Collection Time: 12/15/22  6:49 PM  Result Value Ref Range   Sodium 134 (L) 135 - 145 mmol/L   Potassium 3.6 3.5 - 5.1 mmol/L   Chloride 99 98 - 111 mmol/L   BUN 12 8 - 23 mg/dL   Creatinine, Ser 0.86 0.61 - 1.24 mg/dL   Glucose, Bld 578 (H) 70 - 99 mg/dL    Comment: Glucose reference range applies only to  samples taken after fasting for at least 8 hours.   Calcium, Ion 1.16 1.15 - 1.40 mmol/L   TCO2 21 (L) 22 - 32 mmol/L   Hemoglobin 12.6 (L) 13.0 - 17.0 g/dL   HCT 46.9 (L) 62.9 - 52.8 %  I-Stat CG4 Lactic Acid     Status: None   Collection Time: 12/15/22  6:49 PM  Result Value Ref Range   Lactic Acid, Venous 0.8 0.5 - 1.9 mmol/L  Type and screen [facility]     Status: None   Collection Time: 12/15/22  8:01 PM  Result Value Ref Range   ABO/RH(D) A POS    Antibody Screen NEG    Sample Expiration      12/18/2022,2359 Performed at [facility], [address], [place], [place] [address]   ABO/Rh     Status: None   Collection Time: 12/15/22  8:09 PM  Result Value Ref Range   ABO/RH(D)      A POS Performed at [facility], [address], [place], [place] [address]   Troponin I (High Sensitivity)     Status: None   Collection Time: 12/15/22  9:09 PM  Result Value Ref Range   Troponin I (High Sensitivity) 10 <18 ng/L    Comment: (NOTE) Elevated high sensitivity troponin I (hsTnI) values and significant  changes across serial measurements may suggest ACS but many other  chronic and acute conditions are known to elevate hsTnI results.  Refer to the "Links" section for chest pain algorithms and additional  guidance. Performed at [facility], [address], [place], [place] [address]   I-Stat CG4 Lactic Acid     Status: None   Collection Time: 12/15/22  9:32 PM  Result Value Ref Range   Lactic Acid, Venous 1.8 0.5 - 1.9  mmol/L   CT HEAD WO CONTRAST ( )  Result Date: 12/15/2022 CLINICAL DATA:  Fall.  Subarachnoid hemorrhage. EXAM: CT HEAD WITHOUT CONTRAST TECHNIQUE: Contiguous axial images were obtained from the base of the skull through the vertex without intravenous contrast. RADIATION DOSE REDUCTION: This exam was performed according to the departmental dose-optimization program which includes automated exposure control, adjustment of the mA  and/or kV according to patient size and/or use of iterative reconstruction technique. COMPARISON:  Earlier CT dated 12/15/2022. FINDINGS: Evaluation of this exam is limited due to motion artifact. Brain: Mild age-related atrophy and moderate chronic microvascular ischemic changes. No significant interval change in the intracranial hemorrhages seen on the earlier CT. No new hemorrhage. No mass effect or midline shift. Vascular: No hyperdense vessel or unexpected calcification. Skull: Normal. Negative for fracture or focal lesion. Sinuses/Orbits: No acute finding. Other: Laceration of the right forehead. IMPRESSION: No significant interval change in the intracranial hemorrhages seen on the earlier CT. Continued close follow-up recommended. No new hemorrhage. Electronically Signed   By: Elgie Collard M.D.   On: 12/15/2022 21:09   DG Pelvis Portable  Result Date: 12/15/2022 CLINICAL DATA:  Mechanical fall hitting head on counter. EXAM: PORTABLE PELVIS 1-2 VIEWS COMPARISON:  None Available. FINDINGS: The cortical margins of the bony pelvis are intact. No fracture. Pubic symphysis and sacroiliac joints are congruent. Both femoral heads are well-seated in the respective acetabula. Brachytherapy seeds in the prostate. There is a sclerotic focus in the left pelvis above the acetabulum. IMPRESSION: 1. No pelvic fracture. 2. Sclerotic focus in the left pelvis is nonspecific in the setting of prior prostate cancer. Recommend correlation with any outside imaging if available to assess for stability. Electronically Signed   By: Narda Rutherford M.D.   On: 12/15/2022 19:41   DG Chest Port 1 View  Result Date: 12/15/2022 CLINICAL DATA:  Mechanical fall hitting head on counter. EXAM: PORTABLE CHEST 1 VIEW COMPARISON:  06/21/2020 FINDINGS: The heart is normal in size. Coronary stent visualized. Mediastinal contours are unchanged. No pneumothorax, large pleural effusion, or focal airspace disease. On limited assessment, no  acute osseous abnormalities are seen. IMPRESSION: No acute chest findings. Electronically Signed   By: Narda Rutherford M.D.   On: 12/15/2022 19:34   CT HEAD WO CONTRAST  Result Date: 12/15/2022 CLINICAL DATA:  Trauma EXAM: CT HEAD WITHOUT CONTRAST TECHNIQUE: Contiguous axial images were obtained from the base of the skull through the vertex without intravenous contrast. RADIATION DOSE REDUCTION: This exam was performed according to the departmental dose-optimization program which includes automated exposure control, adjustment of the mA and/or kV according to patient size and/or use of iterative reconstruction technique. COMPARISON:  Head CT 06/03/2013 FINDINGS: Brain: There are scattered areas of subarachnoid hemorrhage in the left occipital lobe, bilateral high frontal lobes, and right temporal lobe. Small amount of subarachnoid hemorrhage is noted posterior to the right midbrain extending into the cerebral aqueduct. No extra-axial fluid collection or acute infarct. No mass effect or midline shift. No hydrocephalus. There is new mild periventricular white matter hypodensity, likely chronic small vessel ischemic change. Vascular: Atherosclerotic calcifications are present within the cavernous internal carotid arteries. Skull: Normal. Negative for fracture or focal lesion. Sinuses/Orbits: No acute finding. Other: None. IMPRESSION: 1. Scattered areas of subarachnoid hemorrhage in the left occipital lobe, bilateral high frontal lobes, and right temporal lobe. 2. Small amount of subarachnoid hemorrhage posterior to the right midbrain extending into the cerebral aqueduct. No hydrocephalus. These results were called by telephone at the  time of interpretation on 12/15/2022 at 7:17 pm to provider [facility] , who verbally acknowledged these results. Electronically Signed   By: Darliss Cheney M.D.   On: 12/15/2022 19:17   CT CERVICAL SPINE WO CONTRAST  Result Date: 12/15/2022 CLINICAL DATA:  Polytrauma, blunt EXAM:  CT CERVICAL SPINE WITHOUT CONTRAST TECHNIQUE: Multidetector CT imaging of the cervical spine was performed without intravenous contrast. Multiplanar CT image reconstructions were also generated. RADIATION DOSE REDUCTION: This exam was performed according to the departmental dose-optimization program which includes automated exposure control, adjustment of the mA and/or kV according to patient size and/or use of iterative reconstruction technique. COMPARISON:  None Available. FINDINGS: Alignment: Slight degenerative anterolisthesis of C7 on T1 and T1 on T2. Skull base and vertebrae: No acute fracture. No primary bone lesion or focal pathologic process. Soft tissues and spinal canal: No prevertebral fluid or swelling. No visible canal hematoma. Disc levels: Bony fusion across the disc spaces from C3-4 through C5-6. Degenerative disc disease at C6-7. Advanced bilateral degenerative facet disease. Upper chest: No acute findings Other: None IMPRESSION: Degenerative disc and facet disease.  Fusion from C3-4 through C5-6. No acute bony abnormality. Electronically Signed   By: Charlett Nose M.D.   On: 12/15/2022 19:07    Pending Labs Unresulted Labs (From admission, onward)     Start     Ordered   12/15/22 1843  Urinalysis, Routine w reflex microscopic -Urine, Clean Catch  [facility] ED TRAUMA PANEL [facility])  Once,   URGENT       Question:  Specimen Source  Answer:  Urine, Clean Catch   12/15/22 1842            Vitals/Pain Today's Vitals   12/16/22 0449 12/16/22 0704 12/16/22 0809 12/16/22 0830  BP: (!) 145/81  114/64 113/62  Pulse: 96  60 68  Resp: 20  20 18   Temp: 97.9 F (36.6 C)     TempSrc: Oral     SpO2: 100%  100% 100%  Weight:      Height:      PainSc: 0-No pain 0-No pain      Isolation Precautions No active isolations  Medications Medications  acetaminophen (TYLENOL) tablet 650 mg (has no administration in time range)    Or  acetaminophen (TYLENOL) suppository 650 mg (has no  administration in time range)  HYDROcodone-acetaminophen (NORCO/VICODIN) 5-325 MG per tablet 1 tablet (has no administration in time range)  HYDROmorphone (DILAUDID) injection 0.5 mg (0.5 mg Intravenous Given 12/16/22 0637)  docusate sodium (COLACE) capsule 100 mg (100 mg Oral Given 12/15/22 2356)  polyethylene glycol (MIRALAX / GLYCOLAX) packet 17 g (has no administration in time range)  ondansetron (ZOFRAN) tablet 4 mg (has no administration in time range)    Or  ondansetron (ZOFRAN) injection 4 mg (has no administration in time range)  promethazine (PHENERGAN) tablet 12.5-25 mg (has no administration in time range)  labetalol (NORMODYNE) injection 10-40 mg (has no administration in time range)  famotidine (PEPCID) IVPB 20 mg premix (0 mg Intravenous Stopped 12/15/22 2342)  sodium chloride 0.9 % bolus 125 mL (0 mLs Intravenous Stopped 12/15/22 2050)  Tdap (BOOSTRIX) injection 0.5 mL (0.5 mLs Intramuscular Given 12/15/22 2004)  LORazepam (ATIVAN) injection 0.5 mg (0.5 mg Intravenous Given 12/15/22 2058)    Mobility Unknown post fall baseline with walker      Focused Assessments Neuro Assessment Handoff:  Swallow screen pass?  N/a         Neuro Assessment: Exceptions to [facility] Neuro  Checks:      Has TPA been given? No If patient is a Neuro Trauma and patient is going to OR before floor call report to 4N Charge nurse: [phone] or [phone]   R Recommendations: See Admitting Provider Note  Report given to:   Additional Notes: Pt daughter is bedside.

## 2022-12-16 NOTE — Plan of Care (Signed)
  Problem: Health Behavior/Discharge Planning: Goal: Ability to manage health-related needs will improve Outcome: Progressing   

## 2022-12-16 NOTE — Progress Notes (Signed)
Patient's daughter reports he is usually alert and oriented x4 and very active. He still drives and prepares small meals at home. Patient was able to swallow water and ate ice cream, but required assistance. RN will advance diet as tolerated per order to full liquid to start.

## 2022-12-16 NOTE — H&P (Signed)
Neurosurgery H&P  CC: Closed TBI  HPI: This is a 89 y.o. man that presents after a fall, PMHx includes COPD, prior NSTEMI / CHF, NSVT, PAF, on ASA81. Altered afterwards, fall unwitnessed but family states he's been having mechanical falls, tripped over his cat once, fell forwards out of his recliner, without any recent syncopal / presyncopal Sx they're aware of. He has been awake but altered, lives at home with his wife with some assistance. On exam, he's altered and unable to provide any further history about the fall or PMHx.    ROS: A 14 point ROS was performed and is negative except as noted in the HPI.   PMHx:  Past Medical History:  Diagnosis Date   Acquired hypothyroidism 08/15/2019   Acute systolic heart failure (HCC) 06/25/2020   Anemia 02/05/2020   boarderline anemic   Atopic dermatitis 12/05/2019   Bacterial pneumonia, unspecified 06/14/2012   06/14/2012 CXR w/ RML PNA >Zithromax and Rocephin     Bradycardia 11/28/2018   CAD (coronary artery disease) 07/07/2020   Cancer (HCC)    prostate cancer-seed implant   Closed fracture dislocation of right elbow    Closed fracture of right olecranon process 04/10/2020   COPD (chronic obstructive pulmonary disease) (HCC) 01/21/2020   Dermatitis 02/05/2020   Dizziness 02/19/2016   Essential hypertension 11/28/2018   History of prostate cancer    seed implant   Hyperlipemia    Hyperlipidemia, mixed 08/21/2008   Qualifier: Diagnosis of  By: Maple Hudson MD, Clinton D    LVH (left ventricular hypertrophy) 11/28/2018   Malnutrition of moderate degree (HCC) 08/15/2019   Mixed conductive and sensorineural hearing loss, 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   Moderate persistent asthma, uncomplicated 01/21/2020   Nonsustained ventricular tachycardia (HCC) 01/30/2019   NSTEMI (non-ST elevated myocardial infarction) (HCC) 06/21/2020   Other fatigue 02/05/2020   PAF (paroxysmal atrial fibrillation) (HCC) 06/25/2020   Seasonal and perennial allergic rhinitis 05/17/2007       Senile  osteoporosis 08/15/2019   SINUSITIS, ACUTE 06/28/2007   Qualifier: Diagnosis of  By: Clent Ridges NP, Tammy     Ventricular ectopy 11/28/2018   FamHx:  Family History  Problem Relation Age of Onset   Alzheimer's disease Father    Diabetes Mother    SocHx:  reports that he quit smoking about 64 years ago. His smoking use included cigarettes. He quit smokeless tobacco use about 13 years ago.  His smokeless tobacco use included chew. He reports that he does not drink alcohol and does not use drugs.  Exam: Vital signs in last 24 hours: Temp:  [97.9 F (36.6 C)-98.2 F (36.8 C)] 97.9 F (36.6 C) (08/09 0449) Pulse Rate:  [60-96] 68 (08/09 0830) Resp:  [9-26] 18 (08/09 0830) BP: (113-158)/(62-86) 113/62 (08/09 0830) SpO2:  [94 %-100 %] 100 % (08/09 0830) Weight:  [63.5 kg] 63.5 kg (08/08 1841) General: Lying in bed in NAD, mumbling about taking his dog out for a walk Head: Normocephalic HEENT: Neck supple Pulmonary: breathing room air comfortably, no evidence of increased work of breathing Cardiac: Regular with rate in the 70s Abdomen: S NT ND Extremities: Warm and well perfused x4 with senile purpura Neuro: Somnolent, mumbling about taking his dog out for a walk, doesn't answer questions, opens eyes to voice / stim, gaze conjugate, pupils equal, laying with legs crossed and MAEx4 equally but not following commands  Assessment and Plan: 89 y.o. man s/p fall with AMS. CTH personally reviewed, which shows diffuse scattered traumatic pattern subarachnoid hemorrhage.   -  admit for observation until he's safe for discharge -hold ASA81 -diet / activity as tolerated as his exam improves -no repeat CTH unless exam changes -SCDs/TEDs, hold SQH for now  Jadene Pierini, MD 12/16/22 8:34 AM [facility]

## 2022-12-16 NOTE — ED Notes (Signed)
Dr. Maurice Small called back in regards to a earlier nurse page and updated on pt.

## 2022-12-16 NOTE — ED Notes (Signed)
ED TO INPATIENT HANDOFF REPORT  ED Nurse Name and Phone #: Delice Bison, RN  S Name/Age/Gender James Albright Sr. 89 y.o. male Room/Bed: 009C/009C  Code Status   Code Status: Full Code  Home/SNF/Other Home Patient oriented to: self Is this baseline? No   Triage Complete: Triage complete  Chief Complaint Subarachnoid hemorrhage [facility]) [I60.9]  Triage Note Patient bib [facility] from home after a mechanical fall.EMS reports he hit his head on the counter, unsure of LOC due to it being unwitnessed. Wife believes that the fall occurred around 5-5:30pm. He had an abrupt change in mental status, baseline he is alert and oriented x4 and can ambulate. EMS reports he is GCS of 14 on arrival to the ED he is GCS of 12. C-collar was not placed with EMS and was placed on arrival to ED.    Allergies Allergies  Allergen Reactions   Clarithromycin     REACTION: GI upset    Level of Care/Admitting Diagnosis ED Disposition     ED Disposition  Admit   Condition  --   Comment  Hospital Area: [facility] [[id number]]  Level of Care: Progressive [102]  Admit to Progressive based on following criteria: NEUROLOGICAL AND NEUROSURGICAL complex patients with significant risk of instability, who do not meet ICU criteria, yet require close observation or frequent assessment (< / = every 2 - 4 hours) with medical / nursing intervention.  May admit patient to [facility] or [facility] if equivalent level of care is available:: No  Covid Evaluation: Asymptomatic - no recent exposure (last 10 days) testing not required  Diagnosis: Subarachnoid hemorrhage (HCC) [430.ICD-9-CM]  Admitting Physician: Jadene Pierini [[id number]]  Attending Physician: Jadene Pierini [[id number]]  Certification:: I certify this patient will need inpatient services for at least 2 midnights  Estimated Length of Stay: 3          B Medical/Surgery History Past Medical History:  Diagnosis Date   Acquired  hypothyroidism 08/15/2019   Acute systolic heart failure (HCC) 06/25/2020   Anemia 02/05/2020   boarderline anemic   Atopic dermatitis 12/05/2019   Bacterial pneumonia, unspecified 06/14/2012   06/14/2012 CXR w/ RML PNA >Zithromax and Rocephin     Bradycardia 11/28/2018   CAD (coronary artery disease) 07/07/2020   Cancer (HCC)    prostate cancer-seed implant   Closed fracture dislocation of right elbow    Closed fracture of right olecranon process 04/10/2020   COPD (chronic obstructive pulmonary disease) (HCC) 01/21/2020   Dermatitis 02/05/2020   Dizziness 02/19/2016   Essential hypertension 11/28/2018   History of prostate cancer    seed implant   Hyperlipemia    Hyperlipidemia, mixed 08/21/2008   Qualifier: Diagnosis of  By: Maple Hudson MD, Clinton D    LVH (left ventricular hypertrophy) 11/28/2018   Malnutrition of moderate degree (HCC) 08/15/2019   Mixed conductive and sensorineural hearing loss, 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   Moderate persistent asthma, uncomplicated 01/21/2020   Nonsustained ventricular tachycardia (HCC) 01/30/2019   NSTEMI (non-ST elevated myocardial infarction) (HCC) 06/21/2020   Other fatigue 02/05/2020   PAF (paroxysmal atrial fibrillation) (HCC) 06/25/2020   Seasonal and perennial allergic rhinitis 05/17/2007       Senile osteoporosis 08/15/2019   SINUSITIS, ACUTE 06/28/2007   Qualifier: Diagnosis of  By: Clent Ridges NP, Tammy     Ventricular ectopy 11/28/2018   Past Surgical History:  Procedure Laterality Date   APPENDECTOMY     CORONARY ATHERECTOMY N/A 06/24/2020   Procedure: CORONARY ATHERECTOMY;  Surgeon: Iran Ouch, MD;  Location: [facility] INVASIVE CV LAB;  Service: Cardiovascular;  Laterality: N/A;   CORONARY ULTRASOUND/IVUS N/A 06/24/2020   Procedure: Intravascular Ultrasound/IVUS;  Surgeon: Iran Ouch, MD;  Location: [facility] INVASIVE CV LAB;  Service: Cardiovascular;  Laterality: N/A;   LEFT HEART CATH AND CORONARY ANGIOGRAPHY N/A 06/22/2020   Procedure: LEFT HEART CATH AND CORONARY  ANGIOGRAPHY;  Surgeon: Yvonne Kendall, MD;  Location: [facility] INVASIVE CV LAB;  Service: Cardiovascular;  Laterality: N/A;   ORIF ELBOW FRACTURE Right 04/10/2020   Procedure: OPEN REDUCTION INTERNAL FIXATION (ORIF) ELBOW/OLECRANON FRACTURE;  Surgeon: Teryl Lucy, MD;  Location: [facility];  Service: Orthopedics;  Laterality: Right;     A IV Location/Drains/Wounds Patient Lines/Drains/Airways Status     Active Line/Drains/Airways     Name Placement date Placement time Site Days   Peripheral IV 12/15/22 20 G Anterior;Distal;Left;Upper Arm 12/15/22  1837  Arm  1   Peripheral IV 12/15/22 20 G Anterior;Right Forearm 12/15/22  1853  Forearm  1   Peripheral IV 12/15/22 20 G Anterior;Left;Upper Arm 12/15/22  2248  Arm  1   Incision (Closed) 04/10/20 Elbow Right 04/10/20  1520  -- 980            Intake/Output Last 24 hours  Intake/Output Summary (Last 24 hours) at 12/16/2022 1310 Last data filed at 12/15/2022 2342 Gross per 24 hour  Intake 60.56 ml  Output --  Net 60.56 ml    Labs/Imaging Results for orders placed or performed during the hospital encounter of 12/15/22 (from the past 48 hour(s))  Comprehensive metabolic panel     Status: Abnormal   Collection Time: 12/15/22  6:40 PM  Result Value Ref Range   Sodium 134 (L) 135 - 145 mmol/L   Potassium 3.7 3.5 - 5.1 mmol/L   Chloride 99 98 - 111 mmol/L   CO2 23 22 - 32 mmol/L   Glucose, Bld 118 (H) 70 - 99 mg/dL    Comment: Glucose reference range applies only to samples taken after fasting for at least 8 hours.   BUN 12 8 - 23 mg/dL   Creatinine, Ser 1.61 0.61 - 1.24 mg/dL   Calcium 9.0 8.9 - 09.6 mg/dL   Total Protein 6.5 6.5 - 8.1 g/dL   Albumin 3.4 (L) 3.5 - 5.0 g/dL   AST 25 15 - 41 U/L   ALT 21 0 - 44 U/L   Alkaline Phosphatase 49 38 - 126 U/L   Total Bilirubin 0.3 0.3 - 1.2 mg/dL   GFR, Estimated >04 >54 mL/min    Comment: (NOTE) Calculated using the CKD-EPI Creatinine Equation (2021)    Anion gap 12 5 -  15    Comment: Performed at [facility], [address] [place], [place] [address]  CBC     Status: Abnormal   Collection Time: 12/15/22  6:40 PM  Result Value Ref Range   WBC 10.1 4.0 - 10.5 K/uL   RBC 3.61 (L) 4.22 - 5.81 MIL/uL   Hemoglobin 11.6 (L) 13.0 - 17.0 g/dL   HCT 91.4 (L) 78.2 - 95.6 %   MCV 99.4 80.0 - 100.0 fL   MCH 32.1 26.0 - 34.0 pg   MCHC 32.3 30.0 - 36.0 g/dL   RDW 21.3 08.6 - 57.8 %   Platelets 197 150 - 400 K/uL   nRBC 0.0 0.0 - 0.2 %    Comment: Performed at [facility], [address], [place], [place] [address]  Ethanol     Status: None   Collection Time: 12/15/22  6:40 PM  Result Value Ref Range   Alcohol, Ethyl (B) <10 <10 mg/dL    Comment: (NOTE) Lowest detectable limit for serum alcohol is 10 mg/dL.  For medical purposes only. Performed at [facility], [address], [place], [place] [address]   Troponin I (High Sensitivity)     Status: None   Collection Time: 12/15/22  6:40 PM  Result Value Ref Range   Troponin I (High Sensitivity) 9 <18 ng/L    Comment: (NOTE) Elevated high sensitivity troponin I (hsTnI) values and significant  changes across serial measurements may suggest ACS but many other  chronic and acute conditions are known to elevate hsTnI results.  Refer to the "Links" section for chest pain algorithms and additional  guidance. Performed at [facility], [address] [place], [place] [address]   Ammonia     Status: None   Collection Time: 12/15/22  6:40 PM  Result Value Ref Range   Ammonia <10 9 - 35 umol/L    Comment: Performed at [facility], [address], [place], [place] [address]  I-stat chem 8, ED (not at [facility], [facility] or [facility])     Status: Abnormal   Collection Time: 12/15/22  6:49 PM  Result Value Ref Range   Sodium 134 (L) 135 - 145 mmol/L   Potassium 3.6 3.5 - 5.1 mmol/L   Chloride 99 98 - 111 mmol/L   BUN 12 8 - 23 mg/dL   Creatinine, Ser 9.52 0.61 - 1.24 mg/dL   Glucose, Bld 841  (H) 70 - 99 mg/dL    Comment: Glucose reference range applies only to samples taken after fasting for at least 8 hours.   Calcium, Ion 1.16 1.15 - 1.40 mmol/L   TCO2 21 (L) 22 - 32 mmol/L   Hemoglobin 12.6 (L) 13.0 - 17.0 g/dL   HCT 32.4 (L) 40.1 - 02.7 %  I-Stat CG4 Lactic Acid     Status: None   Collection Time: 12/15/22  6:49 PM  Result Value Ref Range   Lactic Acid, Venous 0.8 0.5 - 1.9 mmol/L  Type and screen [facility]     Status: None   Collection Time: 12/15/22  8:01 PM  Result Value Ref Range   ABO/RH(D) A POS    Antibody Screen NEG    Sample Expiration      12/18/2022,2359 Performed at [facility], [address] [place], [place] [address]   ABO/Rh     Status: None   Collection Time: 12/15/22  8:09 PM  Result Value Ref Range   ABO/RH(D)      A POS Performed at [facility], [address], [place], [place] [address]   Troponin I (High Sensitivity)     Status: None   Collection Time: 12/15/22  9:09 PM  Result Value Ref Range   Troponin I (High Sensitivity) 10 <18 ng/L    Comment: (NOTE) Elevated high sensitivity troponin I (hsTnI) values and significant  changes across serial measurements may suggest ACS but many other  chronic and acute conditions are known to elevate hsTnI results.  Refer to the "Links" section for chest pain algorithms and additional  guidance. Performed at [facility], [address], [place], [place] [address]   I-Stat CG4 Lactic Acid     Status: None   Collection Time: 12/15/22  9:32 PM  Result Value Ref Range   Lactic Acid, Venous 1.8 0.5 - 1.9 mmol/L   CT HEAD WO CONTRAST ( )  Result Date: 12/15/2022 CLINICAL DATA:  Fall.  Subarachnoid hemorrhage. EXAM: CT HEAD WITHOUT CONTRAST TECHNIQUE: Contiguous axial images were obtained from the base of the skull through the vertex without intravenous contrast. RADIATION DOSE REDUCTION: This exam was performed according to the departmental dose-optimization  program which includes automated exposure control, adjustment of the mA and/or kV according to patient size and/or use of iterative reconstruction technique. COMPARISON:  Earlier CT dated 12/15/2022. FINDINGS: Evaluation of this exam is limited due to motion artifact. Brain: Mild age-related atrophy and moderate chronic microvascular ischemic changes. No significant interval change in the intracranial hemorrhages seen on the earlier CT. No new hemorrhage. No mass effect or midline shift. Vascular: No hyperdense vessel or unexpected calcification. Skull: Normal. Negative for fracture or focal lesion. Sinuses/Orbits: No acute finding. Other: Laceration of the right forehead. IMPRESSION: No significant interval change in the intracranial hemorrhages seen on the earlier CT. Continued close follow-up recommended. No new hemorrhage. Electronically Signed   By: Elgie Collard M.D.   On: 12/15/2022 21:09   DG Pelvis Portable  Result Date: 12/15/2022 CLINICAL DATA:  Mechanical fall hitting head on counter. EXAM: PORTABLE PELVIS 1-2 VIEWS COMPARISON:  None Available. FINDINGS: The cortical margins of the bony pelvis are intact. No fracture. Pubic symphysis and sacroiliac joints are congruent. Both femoral heads are well-seated in the respective acetabula. Brachytherapy seeds in the prostate. There is a sclerotic focus in the left pelvis above the acetabulum. IMPRESSION: 1. No pelvic fracture. 2. Sclerotic focus in the left pelvis is nonspecific in the setting of prior prostate cancer. Recommend correlation with any outside imaging if available to assess for stability. Electronically Signed   By: Narda Rutherford M.D.   On: 12/15/2022 19:41   DG Chest Port 1 View  Result Date: 12/15/2022 CLINICAL DATA:  Mechanical fall hitting head on counter. EXAM: PORTABLE CHEST 1 VIEW COMPARISON:  06/21/2020 FINDINGS: The heart is normal in size. Coronary stent visualized. Mediastinal contours are unchanged. No pneumothorax, large  pleural effusion, or focal airspace disease. On limited assessment, no acute osseous abnormalities are seen. IMPRESSION: No acute chest findings. Electronically Signed   By: Narda Rutherford M.D.   On: 12/15/2022 19:34   CT HEAD WO CONTRAST  Result Date: 12/15/2022 CLINICAL DATA:  Trauma EXAM: CT HEAD WITHOUT CONTRAST TECHNIQUE: Contiguous axial images were obtained from the base of the skull through the vertex without intravenous contrast. RADIATION DOSE REDUCTION: This exam was performed according to the departmental dose-optimization program which includes automated exposure control, adjustment of the mA and/or kV according to patient size and/or use of iterative reconstruction technique. COMPARISON:  Head CT 06/03/2013 FINDINGS: Brain: There are scattered areas of subarachnoid hemorrhage in the left occipital lobe, bilateral high frontal lobes, and right temporal lobe. Small amount of subarachnoid hemorrhage is noted posterior to the right midbrain extending into the cerebral aqueduct. No extra-axial fluid collection or acute infarct. No mass effect or midline shift. No hydrocephalus. There is new mild periventricular white matter hypodensity, likely chronic small vessel ischemic change. Vascular: Atherosclerotic calcifications are present within the cavernous internal carotid arteries. Skull: Normal. Negative for fracture or focal lesion. Sinuses/Orbits: No acute finding. Other: None. IMPRESSION: 1. Scattered areas of subarachnoid hemorrhage in the left occipital lobe, bilateral high frontal lobes, and right temporal lobe. 2. Small amount of subarachnoid hemorrhage posterior to the right midbrain extending into  the cerebral aqueduct. No hydrocephalus. These results were called by telephone at the time of interpretation on 12/15/2022 at 7:17 pm to provider [facility] , who verbally acknowledged these results. Electronically Signed   By: Darliss Cheney M.D.   On: 12/15/2022 19:17   CT CERVICAL SPINE WO  CONTRAST  Result Date: 12/15/2022 CLINICAL DATA:  Polytrauma, blunt EXAM: CT CERVICAL SPINE WITHOUT CONTRAST TECHNIQUE: Multidetector CT imaging of the cervical spine was performed without intravenous contrast. Multiplanar CT image reconstructions were also generated. RADIATION DOSE REDUCTION: This exam was performed according to the departmental dose-optimization program which includes automated exposure control, adjustment of the mA and/or kV according to patient size and/or use of iterative reconstruction technique. COMPARISON:  None Available. FINDINGS: Alignment: Slight degenerative anterolisthesis of C7 on T1 and T1 on T2. Skull base and vertebrae: No acute fracture. No primary bone lesion or focal pathologic process. Soft tissues and spinal canal: No prevertebral fluid or swelling. No visible canal hematoma. Disc levels: Bony fusion across the disc spaces from C3-4 through C5-6. Degenerative disc disease at C6-7. Advanced bilateral degenerative facet disease. Upper chest: No acute findings Other: None IMPRESSION: Degenerative disc and facet disease.  Fusion from C3-4 through C5-6. No acute bony abnormality. Electronically Signed   By: Charlett Nose M.D.   On: 12/15/2022 19:07    Pending Labs Unresulted Labs (From admission, onward)     Start     Ordered   12/15/22 1843  Urinalysis, Routine w reflex microscopic -Urine, Clean Catch  [facility] ED TRAUMA PANEL [facility])  Once,   URGENT       Question:  Specimen Source  Answer:  Urine, Clean Catch   12/15/22 1842            Vitals/Pain Today's Vitals   12/16/22 1130 12/16/22 1146 12/16/22 1208 12/16/22 1215  BP:   (!) 176/112 (!) 156/69  Pulse: 87 (!) (P) 105 (!) 118 60  Resp:  (P) 18 20 18   Temp:      TempSrc:      SpO2: 96% (P) 95% 99% 95%  Weight:      Height:      PainSc:        Isolation Precautions No active isolations  Medications Medications  acetaminophen (TYLENOL) tablet 650 mg (has no administration in time range)    Or   acetaminophen (TYLENOL) suppository 650 mg (has no administration in time range)  HYDROcodone-acetaminophen (NORCO/VICODIN) 5-325 MG per tablet 1 tablet (has no administration in time range)  HYDROmorphone (DILAUDID) injection 0.5 mg (0.5 mg Intravenous Given 12/16/22 0637)  docusate sodium (COLACE) capsule 100 mg (100 mg Oral Not Given 12/16/22 1037)  polyethylene glycol (MIRALAX / GLYCOLAX) packet 17 g (has no administration in time range)  ondansetron (ZOFRAN) tablet 4 mg (has no administration in time range)    Or  ondansetron (ZOFRAN) injection 4 mg (has no administration in time range)  promethazine (PHENERGAN) tablet 12.5-25 mg (has no administration in time range)  labetalol (NORMODYNE) injection 10-40 mg (has no administration in time range)  famotidine (PEPCID) IVPB 20 mg premix (0 mg Intravenous Stopped 12/16/22 1217)  sodium chloride 0.9 % bolus 125 mL (0 mLs Intravenous Stopped 12/15/22 2050)  Tdap (BOOSTRIX) injection 0.5 mL (0.5 mLs Intramuscular Given 12/15/22 2004)  LORazepam (ATIVAN) injection 0.5 mg (0.5 mg Intravenous Given 12/15/22 2058)    Mobility walks with device     Focused Assessments Cardiac Assessment Handoff:    No results found for: "CKTOTAL", "CKMB", "  CKMBINDEX", "TROPONINI" No results found for: "DDIMER" Does the Patient currently have chest pain? No    R Recommendations: See Admitting Provider Note  Report given to:   Additional Notes:

## 2022-12-17 ENCOUNTER — Inpatient Hospital Stay (HOSPITAL_COMMUNITY): Payer: Medicare HMO

## 2022-12-17 DIAGNOSIS — R6510 Systemic inflammatory response syndrome (SIRS) of non-infectious origin without acute organ dysfunction: Secondary | ICD-10-CM

## 2022-12-17 DIAGNOSIS — I480 Paroxysmal atrial fibrillation: Secondary | ICD-10-CM | POA: Diagnosis not present

## 2022-12-17 DIAGNOSIS — I255 Ischemic cardiomyopathy: Secondary | ICD-10-CM

## 2022-12-17 DIAGNOSIS — I5022 Chronic systolic (congestive) heart failure: Secondary | ICD-10-CM

## 2022-12-17 DIAGNOSIS — Z9189 Other specified personal risk factors, not elsewhere classified: Secondary | ICD-10-CM

## 2022-12-17 DIAGNOSIS — G9341 Metabolic encephalopathy: Secondary | ICD-10-CM

## 2022-12-17 DIAGNOSIS — R296 Repeated falls: Secondary | ICD-10-CM

## 2022-12-17 DIAGNOSIS — E876 Hypokalemia: Secondary | ICD-10-CM | POA: Insufficient documentation

## 2022-12-17 LAB — LACTIC ACID, PLASMA: Lactic Acid, Venous: 1.5 mmol/L (ref 0.5–1.9)

## 2022-12-17 LAB — FOLATE: Folate: 23.4 ng/mL (ref >5.9)

## 2022-12-17 LAB — URINALYSIS, MICROSCOPIC (REFLEX)

## 2022-12-17 LAB — IRON AND TIBC
Iron: 10 ug/dL — ABNORMAL LOW (ref 45–182)
Saturation Ratios: 4 % — ABNORMAL LOW (ref 17.9–39.5)
TIBC: 239 ug/dL — ABNORMAL LOW (ref 250–450)
UIBC: 229 ug/dL

## 2022-12-17 LAB — URINALYSIS, ROUTINE W REFLEX MICROSCOPIC
Bilirubin Urine: NEGATIVE
Glucose, UA: NEGATIVE mg/dL
Ketones, ur: 15 mg/dL — AB
Leukocytes,Ua: NEGATIVE
Nitrite: POSITIVE — AB
Protein, ur: 30 mg/dL — AB
Specific Gravity, Urine: >1.03 — ABNORMAL HIGH (ref 1.005–1.030)
pH: 6 (ref 5.0–8.0)

## 2022-12-17 LAB — RESP PANEL BY RT-PCR (RSV, FLU A&B, COVID)  RVPGX2
Influenza A by PCR: NEGATIVE
Influenza B by PCR: NEGATIVE
Resp Syncytial Virus by PCR: NEGATIVE
SARS Coronavirus 2 by RT PCR: NEGATIVE

## 2022-12-17 LAB — BASIC METABOLIC PANEL
Anion gap: 13 (ref 5–15)
BUN: 14 mg/dL (ref 8–23)
CO2: 21 mmol/L — ABNORMAL LOW (ref 22–32)
Calcium: 8.9 mg/dL (ref 8.9–10.3)
Chloride: 101 mmol/L (ref 98–111)
Creatinine, Ser: 0.8 mg/dL (ref 0.61–1.24)
GFR, Estimated: >60 mL/min (ref >60)
Glucose, Bld: 139 mg/dL — ABNORMAL HIGH (ref 70–99)
Potassium: 3.2 mmol/L — ABNORMAL LOW (ref 3.5–5.1)
Sodium: 135 mmol/L (ref 135–145)

## 2022-12-17 LAB — HEPATIC FUNCTION PANEL
ALT: 18 U/L (ref 0–44)
AST: 20 U/L (ref 15–41)
Albumin: 3 g/dL — ABNORMAL LOW (ref 3.5–5.0)
Alkaline Phosphatase: 49 U/L (ref 38–126)
Bilirubin, Direct: 0.2 mg/dL (ref 0.0–0.2)
Indirect Bilirubin: 0.5 mg/dL (ref 0.3–0.9)
Total Bilirubin: 0.7 mg/dL (ref 0.3–1.2)
Total Protein: 6.8 g/dL (ref 6.5–8.1)

## 2022-12-17 LAB — CBC
HCT: 38.5 % — ABNORMAL LOW (ref 39.0–52.0)
Hemoglobin: 12.9 g/dL — ABNORMAL LOW (ref 13.0–17.0)
MCH: 32.5 pg (ref 26.0–34.0)
MCHC: 33.5 g/dL (ref 30.0–36.0)
MCV: 97 fL (ref 80.0–100.0)
Platelets: 230 10*3/uL (ref 150–400)
RBC: 3.97 MIL/uL — ABNORMAL LOW (ref 4.22–5.81)
RDW: 12.2 % (ref 11.5–15.5)
WBC: 20.7 10*3/uL — ABNORMAL HIGH (ref 4.0–10.5)
nRBC: 0 % (ref 0.0–0.2)

## 2022-12-17 LAB — TSH: TSH: 6.177 u[IU]/mL — ABNORMAL HIGH (ref 0.350–4.500)

## 2022-12-17 LAB — RETICULOCYTES
Immature Retic Fract: 10.9 % (ref 2.3–15.9)
RBC.: 4.03 MIL/uL — ABNORMAL LOW (ref 4.22–5.81)
Retic Count, Absolute: 46.7 10*3/uL (ref 19.0–186.0)
Retic Ct Pct: 1.2 % (ref 0.4–3.1)

## 2022-12-17 LAB — VITAMIN B12: Vitamin B-12: 220 pg/mL (ref 180–914)

## 2022-12-17 LAB — TROPONIN I (HIGH SENSITIVITY)
Troponin I (High Sensitivity): 30 ng/L — ABNORMAL HIGH (ref <18)
Troponin I (High Sensitivity): 32 ng/L — ABNORMAL HIGH (ref <18)

## 2022-12-17 LAB — FERRITIN: Ferritin: 177 ng/mL (ref 24–336)

## 2022-12-17 LAB — PROTIME-INR
INR: 1.1 (ref 0.8–1.2)
Prothrombin Time: 14.2 seconds (ref 11.4–15.2)

## 2022-12-17 LAB — PROCALCITONIN: Procalcitonin: 0.95 ng/mL

## 2022-12-17 LAB — MAGNESIUM: Magnesium: 2.1 mg/dL (ref 1.7–2.4)

## 2022-12-17 LAB — LIPASE, BLOOD: Lipase: 29 U/L (ref 11–51)

## 2022-12-17 MED ORDER — SODIUM CHLORIDE 0.9 % IV SOLN: INTRAVENOUS | Status: DC

## 2022-12-17 MED ORDER — POTASSIUM CHLORIDE IN NACL 40-0.9 MEQ/L-% IV SOLN
INTRAVENOUS | Status: DC
  Administered 2022-12-22: 75 mL/h via INTRAVENOUS
  Filled 2022-12-17 (×10): qty 1000

## 2022-12-17 NOTE — Assessment & Plan Note (Addendum)
Hemoglobin has remained stable since fall on 8/8, recheck ordered Anemia panel ordered---> iron level 10      Latest Ref Rng & Units 12/15/2022    6:49 PM 12/15/2022    6:40 PM 07/08/2022   11:54 AM  CBC  WBC 4.0 - 10.5 K/uL  10.1  9.4   Hemoglobin 13.0 - 17.0 g/dL 25.9  56.3  87.5   Hematocrit 39.0 - 52.0 % 37.0  35.9  36.8   Platelets 150 - 400 K/uL  197  209

## 2022-12-17 NOTE — Assessment & Plan Note (Signed)
Increase nursing assistance for communication

## 2022-12-17 NOTE — Progress Notes (Signed)
    Providing Compassionate, Quality Care - Together   NEUROSURGERY PROGRESS NOTE     S: No issues overnight.    O: EXAM:  BP 117/72   Pulse 83   Temp 98.6 F (37 C) (Axillary)   Resp 20   Ht 6' (1.829 m)   Wt 63.5 kg   SpO2 97%   BMI 18.99 kg/m     Somnolent Speech fluent, mumbling  PERRL MAEs x4   ASSESSMENT:  89 y.o. s/p fall with AMS and scattered tSAH on CTH with stable neurologic exam   PLAN: -Continue supportive care, therapies -Call w/ questions/concerns. -Start SQH for dvt ppx tmrw.    Patrici Ranks, [facility]

## 2022-12-17 NOTE — Assessment & Plan Note (Signed)
TSH being checked Continue levothyroxine 175 daily

## 2022-12-17 NOTE — Assessment & Plan Note (Addendum)
History of frequent falls Acute metabolic encephalopathy Patient admitted on 8/8 to the neurosurgical service with traumatic subarachnoid hemorrhage, managed conservatively Continued management per neurosurgery Currently on labetalol as needed to keep SBP under 160

## 2022-12-17 NOTE — Consult Note (Signed)
Initial Consultation Note   Patient: [name] JJO:[id number] DOB: 1931-05-19 PCP: Blane Ohara, MD DOA: 12/15/2022 DOS: the patient was seen and examined on 12/17/2022 Primary service: Jadene Pierini, MD  Referring physician: Patrici Ranks Reason for consult: Atrial fibrillation and frequent PVC's  Assessment/Plan: Assessment and Plan: * Traumatic subarachnoid hemorrhage (HCC) History of frequent falls Acute metabolic encephalopathy Patient admitted on 8/8 to the neurosurgical service with traumatic subarachnoid hemorrhage, managed conservatively Continued management per neurosurgery Currently on labetalol as needed to keep SBP under 160  SIRS (systemic inflammatory response syndrome) (HCC) Patient noted to have a temp of 100.3, tachycardic to 116, O2 sat 91% on room air Will do an infectious workup CBC, CMP, Pro-Cal, lactic acid, UA, chest x-ray as well as lipase and troponin---> lab results: Significant for WBC 20,000, lactic acid 1.5, Pro-Calc 0.9, urinalysis with nitrites and few bacteria, chest x-ray clear, COVID and flu pending Will give gentle fluids and continue to monitor Send urine for urine culture Continue to monitor vitals Patient at baseline mentation so will defer repeat head CT for now.  No meningeal signs to suspect meningitis Continue neurologic checks Can consider LP if deteriorating mental status and persistent fevers  PAF (paroxysmal atrial fibrillation) (HCC) Frequent PVCs with history of NSVT High bleeding risk due to history of falls Patient with known history of NSVT and A-fib and CHADS2 Vassore 4, onset 2022 not anticoagulated due to bleeding risk with frequent falls and frailty Get baseline EKG Get potassium and magnesium and ensure potassium remains over 4 and magnesium over 2---> K+ 3.2 so will replete, mag normal at 2.1 Will check TSH---> slightly elevated above 6 Continue metoprolol.  Defer aspirin to neurosurgery given recent  [facility] Continuous cardiac monitoring Can consider cardiology consult in the a.m.  CAD S/P LAD stent 2022 HFrEF with improved EF(35>55%, 2022)/ischemic cardiomyopathy Clinically euvolemic Troponin 30 but EKG nonacute Continue metoprolol, atorvastatin. Will defer continuation of aspirin to neurosurgery  Hypokalemia Potassium 3.2 so will replete  Anemia Hemoglobin has remained stable since fall on 8/8, recheck ordered Anemia panel ordered---> iron level 10      Latest Ref Rng & Units 12/15/2022    6:49 PM 12/15/2022    6:40 PM 07/08/2022   11:54 AM  CBC  WBC 4.0 - 10.5 K/uL  10.1  9.4   Hemoglobin 13.0 - 17.0 g/dL 16.0  10.9  32.3   Hematocrit 39.0 - 52.0 % 37.0  35.9  36.8   Platelets 150 - 400 K/uL  197  209      Moderate persistent asthma, uncomplicated No acute issues DuoNebs as needed  Mixed conductive and sensorineural hearing loss, bilateral Increase nursing assistance for communication  Acquired hypothyroidism TSH being checked Continue levothyroxine 175 daily  Essential hypertension Continue metoprolol       TRH will continue to follow the patient.  HPI: James Tawes Sr. is a 89 y.o. male with past medical history of Nonsustained V. tach, A-fib, ischemic cardiomyopathy (HFrEF with improved EF 35% improved to 55% 2022) HTN, COPD and hypothyroidism, as well as chronic anemia, frequent falls currently admitted to the neurosurgical service for altered mental status secondary to traumatic subarachnoid hemorrhage, who is being seen in consultation for runs of A-fib and frequent PVCs on the monitor.  Patient is asymptomatic.  Patient was last hospitalized back in February 2022 with NSTEMI with cath showing severe single-vessel disease (heavily calcified mid LAD stenosis up to 90%) which was stented with severe residual stenosis.  Recommendation was made for aggressive secondary prevention.  Echo showed improved EF to 55% from 35%.  Patient was not anticoagulated despite  CHA2DS2-VASc score of 4 due to anemia, frail state and frequent falls.  He did not receive a GI workup for his anemia due to frailty. Patient is currently with a stable neurologic exam and is scheduled for initiation of subcu heparin for DVT prophylaxis on 12/18/2022. Data review: Neuro surgical history and physical and progress notes, nurse progress notes reviewed along with vitals since admission, as well as medication history. Vitals at the time of consultation: Temperature 100.3, BP 156/97 with pulse 116, respirations 18 and O2 sat 91% on room air. Labs done on admission on 8/8 significant for hemoglobin 8.6-12.6 with lactic acid 0.8, EtOH less than 10  EKG 8/8, personally viewed and interpreted showed sinus rhythm at 82 with nonspecific ST-T wave changes..  Review of Systems: As mentioned in the history of present illness. All other systems reviewed and are negative. Past Medical History:  Diagnosis Date   Acquired hypothyroidism 08/15/2019   Acute systolic heart failure (HCC) 06/25/2020   Anemia 02/05/2020   boarderline anemic   Atopic dermatitis 12/05/2019   Bacterial pneumonia, unspecified 06/14/2012   06/14/2012 CXR w/ RML PNA >Zithromax and Rocephin     Bradycardia 11/28/2018   CAD (coronary artery disease) 07/07/2020   Cancer (HCC)    prostate cancer-seed implant   Closed fracture dislocation of right elbow    Closed fracture of right olecranon process 04/10/2020   COPD (chronic obstructive pulmonary disease) (HCC) 01/21/2020   Dermatitis 02/05/2020   Dizziness 02/19/2016   Essential hypertension 11/28/2018   History of prostate cancer    seed implant   Hyperlipemia    Hyperlipidemia, mixed 08/21/2008   Qualifier: Diagnosis of  By: Maple Hudson MD, Clinton D    LVH (left ventricular hypertrophy) 11/28/2018   Malnutrition of moderate degree (HCC) 08/15/2019   Mixed conductive and sensorineural hearing loss, 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   Moderate persistent asthma, uncomplicated 01/21/2020   Nonsustained  ventricular tachycardia (HCC) 01/30/2019   NSTEMI (non-ST elevated myocardial infarction) (HCC) 06/21/2020   Other fatigue 02/05/2020   PAF (paroxysmal atrial fibrillation) (HCC) 06/25/2020   Seasonal and perennial allergic rhinitis 05/17/2007       Senile osteoporosis 08/15/2019   SINUSITIS, ACUTE 06/28/2007   Qualifier: Diagnosis of  By: Clent Ridges NP, Tammy     Ventricular ectopy 11/28/2018   Past Surgical History:  Procedure Laterality Date   APPENDECTOMY     CORONARY ATHERECTOMY N/A 06/24/2020   Procedure: CORONARY ATHERECTOMY;  Surgeon: Iran Ouch, MD;  Location: [facility];  Service: Cardiovascular;  Laterality: N/A;   CORONARY ULTRASOUND/IVUS N/A 06/24/2020   Procedure: Intravascular Ultrasound/IVUS;  Surgeon: Iran Ouch, MD;  Location: [facility];  Service: Cardiovascular;  Laterality: N/A;   LEFT HEART CATH AND CORONARY ANGIOGRAPHY N/A 06/22/2020   Procedure: LEFT HEART CATH AND CORONARY ANGIOGRAPHY;  Surgeon: Yvonne Kendall, MD;  Location: [facility];  Service: Cardiovascular;  Laterality: N/A;   ORIF ELBOW FRACTURE Right 04/10/2020   Procedure: OPEN REDUCTION INTERNAL FIXATION (ORIF) ELBOW/OLECRANON FRACTURE;  Surgeon: Teryl Lucy, MD;  Location: [facility];  Service: Orthopedics;  Laterality: Right;   Social History:  reports that he quit smoking about 64 years ago. His smoking use included cigarettes. He quit smokeless tobacco use about 13 years ago.  His smokeless tobacco use included chew. He reports that he does not drink alcohol and  does not use drugs.  Allergies  Allergen Reactions   Clarithromycin     REACTION: GI upset    Family History  Problem Relation Age of Onset   Alzheimer's disease Father    Diabetes Mother     Prior to Admission medications   Medication Sig Start Date End Date Taking? Authorizing Provider  albuterol (VENTOLIN HFA) 108 (90 Base) MCG/ACT inhaler Inhale 2 puffs into the lungs every 6 (six) hours  as needed for wheezing or shortness of breath. 10/12/22  Yes Cox, Kirsten, MD  aspirin 81 MG EC tablet Take 1 tablet (81 mg total) by mouth daily. Swallow whole. 06/16/21  Yes Georgeanna Lea, MD  atorvastatin (LIPITOR) 80 MG tablet Take 1 tablet (80 mg total) by mouth daily. 09/13/22  Yes Cox, Kirsten, MD  ferrous sulfate (FERROUSUL) 325 (65 FE) MG tablet Take 1 tablet (325 mg total) by mouth daily with breakfast. 10/12/22  Yes Cox, Kirsten, MD  fexofenadine (ALLEGRA) 180 MG tablet Take 180 mg by mouth daily.   Yes [provider]  ipratropium-albuterol (DUONEB) 0.5-2.5 (3) MG/3ML SOLN Take 3 mLs by nebulization every 4 (four) hours as needed (shortness of breath or wheezing). 09/22/21  Yes Abigail Miyamoto, MD  levothyroxine (SYNTHROID) 175 MCG tablet TAKE ONE TABLET BY MOUTH BEFORE BREAKFAST 12/06/22  Yes Cox, Kirsten, MD  Nutritional Supplements (BOOST NUTRITIONAL ENERGY PO) Take 237 mLs by mouth in the morning and at bedtime. Drinking 1-2 per day   Yes [provider]  terbinafine (LAMISIL) 250 MG tablet Take 1 tablet (250 mg total) by mouth daily. 11/17/22  Yes McCaughan, Dia D, DPM  metoprolol succinate (TOPROL-XL) 50 MG 24 hr tablet Take 1 tablet (50 mg total) by mouth daily. TAKE WITH OR IMMEDIATELY FOLLOWING A MEAL. 09/08/22   Cox, Kirsten, MD  Multiple Vitamins-Minerals (MULTIVITAMIN ADULT PO) Take 1 tablet by mouth daily. Unknown strength Patient not taking: Reported on 12/15/2022    [provider]    Physical Exam: Vitals:   12/17/22 0410 12/17/22 0750 12/17/22 0800 12/17/22 1655  BP: 137/89 109/72 117/72 (!) 156/97  Pulse: 100 87 83 (!) 116  Resp: 20 20 20 18   Temp: 98.7 F (37.1 C) 98.6 F (37 C)  100.3 F (37.9 C)  TempSrc: Oral Axillary  Axillary  SpO2: 96% 97% 97% 91%  Weight:      Height:       Physical Exam Vitals and nursing note reviewed.  Constitutional:      General: He is not in acute distress.    Comments: Frail-appearing elderly male,  awake, tremulous, mumbling, confused, not answering questions  HENT:     Head: Normocephalic.     Comments: Multiple bruises on face and extremities in different stages of healing Cardiovascular:     Rate and Rhythm: Normal rate and regular rhythm.     Heart sounds: Normal heart sounds.  Pulmonary:     Effort: Pulmonary effort is normal.     Breath sounds: Normal breath sounds.  Abdominal:     Palpations: Abdomen is soft.     Tenderness: There is no abdominal tenderness.  Musculoskeletal:     Cervical back: Neck supple.  Neurological:     General: No focal deficit present.     Mental Status: He is disoriented.     Comments: Confused, tremulous--> baseline per nurse Christiane Ha at bedside, no acute change       Data Reviewed: Relevant notes from primary care and specialist visits, past  discharge summaries as available in EHR, including Care Everywhere. Prior diagnostic testing as pertinent to current admission diagnoses Updated medications and problem lists for reconciliation ED course, including vitals, labs, imaging, treatment and response to treatment Triage notes, nursing and pharmacy notes and ED provider's notes Notable results as noted in HPI   Family Communication:  Primary team communication: Patrici Ranks. PA Thank you very much for involving [place] in the care of your patient.  Author: Andris Baumann, MD 12/17/2022 7:45 PM  For on call review [email or web address].

## 2022-12-17 NOTE — Assessment & Plan Note (Addendum)
HFrEF with improved EF(35>55%, 2022)/ischemic cardiomyopathy Clinically euvolemic Troponin 30 but EKG nonacute Continue metoprolol, atorvastatin. Will defer continuation of aspirin to neurosurgery

## 2022-12-17 NOTE — Assessment & Plan Note (Addendum)
Frequent PVCs with history of NSVT High bleeding risk due to history of falls Patient with known history of NSVT and A-fib and CHADS2 Vassore 4, onset 2022 not anticoagulated due to bleeding risk with frequent falls and frailty Get baseline EKG Get potassium and magnesium and ensure potassium remains over 4 and magnesium over 2---> K+ 3.2 so will replete, mag normal at 2.1 Will check TSH---> slightly elevated above 6 Continue metoprolol.  Defer aspirin to neurosurgery given recent [facility] Continuous cardiac monitoring Can consider cardiology consult in the a.m.

## 2022-12-17 NOTE — Assessment & Plan Note (Signed)
Potassium 3.2 so will replete

## 2022-12-17 NOTE — Assessment & Plan Note (Addendum)
Patient noted to have a temp of 100.3, tachycardic to 116, O2 sat 91% on room air Will do an infectious workup CBC, CMP, Pro-Cal, lactic acid, UA, chest x-ray as well as lipase and troponin---> lab results: Significant for WBC 20,000, lactic acid 1.5, Pro-Calc 0.9, urinalysis with nitrites and few bacteria, chest x-ray clear, COVID and flu pending Will give gentle fluids and continue to monitor Send urine for urine culture Continue to monitor vitals Patient at baseline mentation so will defer repeat head CT for now.  No meningeal signs to suspect meningitis Continue neurologic checks Can consider LP if deteriorating mental status and persistent fevers

## 2022-12-17 NOTE — Assessment & Plan Note (Signed)
No acute issues DuoNebs as needed

## 2022-12-17 NOTE — Assessment & Plan Note (Signed)
Continue metoprolol. 

## 2022-12-18 DIAGNOSIS — I480 Paroxysmal atrial fibrillation: Secondary | ICD-10-CM | POA: Diagnosis not present

## 2022-12-18 DIAGNOSIS — D649 Anemia, unspecified: Secondary | ICD-10-CM

## 2022-12-18 DIAGNOSIS — I255 Ischemic cardiomyopathy: Secondary | ICD-10-CM

## 2022-12-18 DIAGNOSIS — Z9181 History of falling: Secondary | ICD-10-CM

## 2022-12-18 DIAGNOSIS — J4540 Moderate persistent asthma, uncomplicated: Secondary | ICD-10-CM

## 2022-12-18 DIAGNOSIS — R296 Repeated falls: Secondary | ICD-10-CM

## 2022-12-18 DIAGNOSIS — S066XAA Traumatic subarachnoid hemorrhage with loss of consciousness status unknown, initial encounter: Secondary | ICD-10-CM | POA: Diagnosis not present

## 2022-12-18 DIAGNOSIS — R6510 Systemic inflammatory response syndrome (SIRS) of non-infectious origin without acute organ dysfunction: Secondary | ICD-10-CM

## 2022-12-18 DIAGNOSIS — I493 Ventricular premature depolarization: Secondary | ICD-10-CM

## 2022-12-18 DIAGNOSIS — E039 Hypothyroidism, unspecified: Secondary | ICD-10-CM

## 2022-12-18 DIAGNOSIS — G9341 Metabolic encephalopathy: Secondary | ICD-10-CM

## 2022-12-18 DIAGNOSIS — I5022 Chronic systolic (congestive) heart failure: Secondary | ICD-10-CM | POA: Diagnosis not present

## 2022-12-18 DIAGNOSIS — E876 Hypokalemia: Secondary | ICD-10-CM

## 2022-12-18 DIAGNOSIS — Z9861 Coronary angioplasty status: Secondary | ICD-10-CM

## 2022-12-18 DIAGNOSIS — H906 Mixed conductive and sensorineural hearing loss, bilateral: Secondary | ICD-10-CM

## 2022-12-18 DIAGNOSIS — I10 Essential (primary) hypertension: Secondary | ICD-10-CM

## 2022-12-18 DIAGNOSIS — Z9189 Other specified personal risk factors, not elsewhere classified: Secondary | ICD-10-CM

## 2022-12-18 DIAGNOSIS — I2510 Atherosclerotic heart disease of native coronary artery without angina pectoris: Secondary | ICD-10-CM

## 2022-12-18 LAB — CBC
HCT: 37.6 % — ABNORMAL LOW (ref 39.0–52.0)
Hemoglobin: 12.3 g/dL — ABNORMAL LOW (ref 13.0–17.0)
MCH: 32.1 pg (ref 26.0–34.0)
MCHC: 32.7 g/dL (ref 30.0–36.0)
MCV: 98.2 fL (ref 80.0–100.0)
Platelets: 204 10*3/uL (ref 150–400)
RBC: 3.83 MIL/uL — ABNORMAL LOW (ref 4.22–5.81)
RDW: 12.3 % (ref 11.5–15.5)
WBC: 16.3 10*3/uL — ABNORMAL HIGH (ref 4.0–10.5)
nRBC: 0 % (ref 0.0–0.2)

## 2022-12-18 LAB — BASIC METABOLIC PANEL
Anion gap: 13 (ref 5–15)
BUN: 15 mg/dL (ref 8–23)
CO2: 22 mmol/L (ref 22–32)
Calcium: 8.7 mg/dL — ABNORMAL LOW (ref 8.9–10.3)
Chloride: 104 mmol/L (ref 98–111)
Creatinine, Ser: 0.78 mg/dL (ref 0.61–1.24)
GFR, Estimated: >60 mL/min (ref >60)
Glucose, Bld: 130 mg/dL — ABNORMAL HIGH (ref 70–99)
Potassium: 3.6 mmol/L (ref 3.5–5.1)
Sodium: 139 mmol/L (ref 135–145)

## 2022-12-18 LAB — MAGNESIUM: Magnesium: 2.2 mg/dL (ref 1.7–2.4)

## 2022-12-18 MED ORDER — METOPROLOL TARTRATE 5 MG/5ML IV SOLN
INTRAVENOUS | Status: AC
  Filled 2022-12-18: qty 5

## 2022-12-18 MED ORDER — METOPROLOL TARTRATE 5 MG/5ML IV SOLN: 5.0000 mg | Freq: Four times a day (QID) | INTRAVENOUS | Status: DC | PRN

## 2022-12-18 MED ORDER — METOPROLOL TARTRATE 25 MG PO TABS
25.0000 mg | ORAL_TABLET | Freq: Two times a day (BID) | ORAL | Status: DC
  Administered 2022-12-18: 25 mg via ORAL
  Filled 2022-12-18 (×3): qty 1

## 2022-12-18 MED ORDER — HEPARIN SODIUM (PORCINE) 5000 UNIT/ML IJ SOLN
5000.0000 [IU] | Freq: Two times a day (BID) | INTRAMUSCULAR | Status: DC
  Administered 2022-12-18 – 2023-01-19 (×60): 5000 [IU] via SUBCUTANEOUS
  Filled 2022-12-18 (×63): qty 1

## 2022-12-18 MED ORDER — METOPROLOL TARTRATE 5 MG/5ML IV SOLN
5.0000 mg | Freq: Once | INTRAVENOUS | Status: AC
  Administered 2022-12-18: 5 mg via INTRAVENOUS

## 2022-12-18 NOTE — Consult Note (Addendum)
Cardiology Consultation   Patient ID: [name] Sr. MRN: [id number]; DOB: Nov 29, 1931  Admit date: 12/15/2022 Date of Consult: 12/18/2022  PCP:  Blane Ohara, MD   [facility] Providers Cardiologist:  Gypsy Balsam, MD  Electrophysiologist:  Regan Lemming, MD       Patient Profile:   James Kucera Sr. is a 89 y.o. male with a history of CAD with NSTEMI in 06/2020 s/p DES to mid LAD , paroxysmal atrial fibrillation not on anticoagulation given age and frailty, short runs of SVT and NSVT on monitor 12/2021, COPD/asthma, hypertension, hyperlipidemia, acquired hypothyroidism, and anemia who is being seen 12/18/2022 for the evaluation of show fibrillation with RVR at the request of Dr. Tyson Babinski.  History of Present Illness:   Mr. [name] an 89 year old male with the above history who is followed by Dr. Bing Matter.  He has a history of CAD with NSTEMI in 06/2020.  LHC at that time showed severe single-vessel CAD with heavily calcified 90% stenosis of mid LAD otherwise nonobstructive disease.  Given worsening anemia, he was started on IV Heparin and seen by GI. He then underwent staged PCI to LAD a few days later. Of note, cath also demonstrated moderately to severely reduced LV function with mid/ apical anterior and apical hypokinesis/ akinesis; however, Echo at that time showed LVEF of 55-60% with normal wall motion, normal RV size and function with moderately elevated PASP, mild to moderate aortic valve sclerosis but no stenosis, and moderate tricuspid regurgitation. It does not look like he has ever had symptomatic CHF and is not on diuretics. He also has a history of paroxysmal atrial fibrillation but has not been on anticoagulation due to age and frailty falls.  He was seen by Dr. Lalla Brothers in 07/2018 for not felt to be a good candidate for Watchman procedure.  He was last seen by Dr. Bing Matter in 09/2022 at which time he was doing well from a cardiac standpoint.    He presented  to the ED on 12/15/2018 for after a fall with head trauma.  Head CT showed diffuse scattered traumatic pattern subarachnoid hemorrhage.  He was admitted under the neurosurgery team for observation.  Patient was noted to have a fever on 8/10 and was tachycardic. Internal Medicine was consulted.  Respiratory panel negative for COVID, influenza, and RSV.  Chest x-ray showed no acute findings.  Procalcitonin and lactic acid were negative.  Urine analysis was unremarkable.  To be in rapid atrial fibrillation with frequent PVCs on telemetry.  He was given a dose of IV Metoprolol and repeat echo was ordered which is still pending. Cardiology now consulted for assistance.   At the time of this evaluation, patient is in mittens.  He would occasionally get mildly agitated/ restless while was performing physical exam and would move extremities away from me but he never opened his eyes and was otherwise unresponsive.  No family is at bedside so history is obtained from chart.  Patient had a ground-level fall that was unwitnessed by his family.  Daughter was at home during this time and reports he came immediately altered afterwards.  Currently unable to determine if patient was having any symptoms prior to fall given his altered mental status but family reported to admitting attending that he has been having frequent mechanical falls family denied any syncope.  He lives at home with his wife with some assistance from family.  Spoke with RN who states his been altered since his admission.  He reportedly  slept all day yesterday but was more alert earlier this morning.  RN states his eyes were open this morning and he was able to redirect him some.  Per review of telemetry, he looks like he is mostly in atrial fibrillation with PVCs and brief runs of sinus rhythm.  Rates currently in the 90s to low 100s at baseline but RN states that if he gets agitated heart rates will quickly increase to 140s to 160s.  Past Medical History:   Diagnosis Date   Acquired hypothyroidism 08/15/2019   Acute systolic heart failure (HCC) 06/25/2020   Anemia 02/05/2020   boarderline anemic   Atopic dermatitis 12/05/2019   Bacterial pneumonia, unspecified 06/14/2012   06/14/2012 CXR w/ RML PNA >Zithromax and Rocephin     Bradycardia 11/28/2018   CAD (coronary artery disease) 07/07/2020   Cancer (HCC)    prostate cancer-seed implant   Closed fracture dislocation of right elbow    Closed fracture of right olecranon process 04/10/2020   COPD (chronic obstructive pulmonary disease) (HCC) 01/21/2020   Dermatitis 02/05/2020   Dizziness 02/19/2016   Essential hypertension 11/28/2018   History of prostate cancer    seed implant   Hyperlipemia    Hyperlipidemia, mixed 08/21/2008   Qualifier: Diagnosis of  By: Maple Hudson MD, Clinton D    LVH (left ventricular hypertrophy) 11/28/2018   Malnutrition of moderate degree (HCC) 08/15/2019   Mixed conductive and sensorineural hearing loss, 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   Moderate persistent asthma, uncomplicated 01/21/2020   Nonsustained ventricular tachycardia (HCC) 01/30/2019   NSTEMI (non-ST elevated myocardial infarction) (HCC) 06/21/2020   Other fatigue 02/05/2020   PAF (paroxysmal atrial fibrillation) (HCC) 06/25/2020   Seasonal and perennial allergic rhinitis 05/17/2007       Senile osteoporosis 08/15/2019   SINUSITIS, ACUTE 06/28/2007   Qualifier: Diagnosis of  By: Clent Ridges NP, Tammy     Ventricular ectopy 11/28/2018    Past Surgical History:  Procedure Laterality Date   APPENDECTOMY     CORONARY ATHERECTOMY N/A 06/24/2020   Procedure: CORONARY ATHERECTOMY;  Surgeon: Iran Ouch, MD;  Location: [facility];  Service: Cardiovascular;  Laterality: N/A;   CORONARY ULTRASOUND/IVUS N/A 06/24/2020   Procedure: Intravascular Ultrasound/IVUS;  Surgeon: Iran Ouch, MD;  Location: [facility];  Service: Cardiovascular;  Laterality: N/A;   LEFT HEART CATH AND CORONARY ANGIOGRAPHY N/A 06/22/2020   Procedure:  LEFT HEART CATH AND CORONARY ANGIOGRAPHY;  Surgeon: Yvonne Kendall, MD;  Location: [facility];  Service: Cardiovascular;  Laterality: N/A;   ORIF ELBOW FRACTURE Right 04/10/2020   Procedure: OPEN REDUCTION INTERNAL FIXATION (ORIF) ELBOW/OLECRANON FRACTURE;  Surgeon: Teryl Lucy, MD;  Location: [facility];  Service: Orthopedics;  Laterality: Right;     Home Medications:  Prior to Admission medications   Medication Sig Start Date End Date Taking? Authorizing Provider  albuterol (VENTOLIN HFA) 108 (90 Base) MCG/ACT inhaler Inhale 2 puffs into the lungs every 6 (six) hours as needed for wheezing or shortness of breath. 10/12/22  Yes Cox, Kirsten, MD  aspirin 81 MG EC tablet Take 1 tablet (81 mg total) by mouth daily. Swallow whole. 06/16/21  Yes Georgeanna Lea, MD  atorvastatin (LIPITOR) 80 MG tablet Take 1 tablet (80 mg total) by mouth daily. 09/13/22  Yes Cox, Kirsten, MD  ferrous sulfate (FERROUSUL) 325 (65 FE) MG tablet Take 1 tablet (325 mg total) by mouth daily with breakfast. 10/12/22  Yes Cox, Kirsten, MD  fexofenadine (ALLEGRA) 180 MG tablet Take  180 mg by mouth daily.   Yes [provider]  ipratropium-albuterol (DUONEB) 0.5-2.5 (3) MG/3ML SOLN Take 3 mLs by nebulization every 4 (four) hours as needed (shortness of breath or wheezing). 09/22/21  Yes Abigail Miyamoto, MD  levothyroxine (SYNTHROID) 175 MCG tablet TAKE ONE TABLET BY MOUTH BEFORE BREAKFAST 12/06/22  Yes Cox, Kirsten, MD  Nutritional Supplements (BOOST NUTRITIONAL ENERGY PO) Take 237 mLs by mouth in the morning and at bedtime. Drinking 1-2 per day   Yes [provider]  terbinafine (LAMISIL) 250 MG tablet Take 1 tablet (250 mg total) by mouth daily. 11/17/22  Yes McCaughan, Dia D, DPM  metoprolol succinate (TOPROL-XL) 50 MG 24 hr tablet Take 1 tablet (50 mg total) by mouth daily. TAKE WITH OR IMMEDIATELY FOLLOWING A MEAL. 09/08/22   Cox, Kirsten, MD  Multiple Vitamins-Minerals  (MULTIVITAMIN ADULT PO) Take 1 tablet by mouth daily. Unknown strength Patient not taking: Reported on 12/15/2022    [provider]    Inpatient Medications: Scheduled Meds:  docusate sodium  100 mg Oral BID   heparin injection (subcutaneous)  5,000 Units Subcutaneous Q12H   metoprolol tartrate       Continuous Infusions:  0.9 % NaCl with KCl 40 mEq / L 100 mL/hr at 12/17/22 2352   famotidine (PEPCID) IV 20 mg (12/18/22 1013)   PRN Meds: acetaminophen **OR** acetaminophen, HYDROmorphone (DILAUDID) injection, metoprolol tartrate, metoprolol tartrate, ondansetron **OR** ondansetron (ZOFRAN) IV, polyethylene glycol  Allergies:    Allergies  Allergen Reactions   Clarithromycin     REACTION: GI upset    Social History:   Social History   Socioeconomic History   Marital status: Married    Spouse name: Eber Jones   Number of children: 1   Years of education: Not on file   Highest education level: Not on file  Occupational History   Occupation: Retired    Associate Professor: OTHER    Comment: Engineer, civil (consulting)  Tobacco Use   Smoking status: Former    Current packs/day: 0.00    Types: Cigarettes    Quit date: 1960    Years since quitting: 64.6   Smokeless tobacco: Former    Types: Chew    Quit date: 2011  Vaping Use   Vaping status: Never Used  Substance and Sexual Activity   Alcohol use: No   Drug use: No   Sexual activity: Not Currently  Other Topics Concern   Not on file  Social History Narrative   Not on file   Social Determinants of Health   Financial Resource Strain: Low Risk  (03/30/2022)   Overall Financial Resource Strain (CARDIA)    Difficulty of Paying Living Expenses: Not hard at all  Food Insecurity: No Food Insecurity (12/16/2022)   Hunger Vital Sign    Worried About Running Out of Food in the Last Year: Never true    Ran Out of Food in the Last Year: Never true  Transportation Needs: No Transportation Needs (12/16/2022)   PRAPARE - Therapist, art (Medical): No    Lack of Transportation (Non-Medical): No  Physical Activity: Insufficiently Active (01/29/2021)   Exercise Vital Sign    Days of Exercise per Week: 7 days    Minutes of Exercise per Session: 10 min  Stress: No Stress Concern Present (01/29/2021)   [employer] of Occupational Health - Occupational Stress Questionnaire    Feeling of Stress : Not at all  Social Connections: Socially Integrated (01/29/2021)   Social  Connection and Isolation Panel [NHANES]    Frequency of Communication with Friends and Family: Three times a week    Frequency of Social Gatherings with Friends and Family: Three times a week    Attends Religious Services: 1 to 4 times per year    Active Member of Clubs or Organizations: Yes    Attends Banker Meetings: 1 to 4 times per year    Marital Status: Married  Catering manager Violence: Not At Risk (12/16/2022)   Humiliation, Afraid, Rape, and Kick questionnaire    Fear of Current or Ex-Partner: No    Emotionally Abused: No    Physically Abused: No    Sexually Abused: No    Family History:   Family History  Problem Relation Age of Onset   Alzheimer's disease Father    Diabetes Mother      ROS:  Please see the history of present illness.  Review of Systems  Unable to perform ROS: Mental status change    Physical Exam/Data:   Vitals:   12/18/22 0000 12/18/22 0400 12/18/22 0800 12/18/22 1200  BP: (!) 140/72 (!) 143/90  (!) 144/89  Pulse: 85 94  93  Resp: (!) 21 (!) 21  (!) 27  Temp: 98.5 F (36.9 C) (!) 97.4 F (36.3 C) 98.4 F (36.9 C)   TempSrc: Oral Axillary Oral   SpO2: 94% 94%  90%  Weight:      Height:        Intake/Output Summary (Last 24 hours) at 12/18/2022 1357 Last data filed at 12/18/2022 0100 Gross per 24 hour  Intake 677.63 ml  Output 220 ml  Net 457.63 ml      12/15/2022    6:41 PM 12/14/2022    3:57 PM 10/11/2022    2:37 PM  Last 3 Weights  Weight (lbs) 140 lb 128 lb 3.2 oz  134 lb 3.2 oz  Weight (kg) 63.504 kg 58.151 kg 60.873 kg     Body mass index is 18.99 kg/m.  General: 89 y.o. thin Caucasian male in no acute distress. Unresponsive wearing mittens. HEENT: Normocephalic and atraumatic. Sclera clear. Neck: Supple. No JVD. Heart: Mildly tachycardic with irregularly irregular rate. No murmurs, gallops, or rubs.  Lungs: No increased work of breathing. Clear to ausculation anteriorly. Abdomen: Soft, non-distended, and non-tender to palpation. Extremities: No lower extremity edema. Skin: Warm and dry. Neuro: Unresponsive.  Psych: Unresponsive. Occasionally agitated/ restless.   EKG:  The EKG was personally reviewed and demonstrates:  Normal sinus rhythm with 1st degree AV block and some baseline wandering but what looks like some mild ST depression in leads V3-V5. Telemetry:  Telemetry was personally reviewed and demonstrates:  Atrial fibrillation with frequent PVC. Rates currently in the 90s to low 100s but will increase to as high as the 140s to 160s.  Relevant CV Studies:  Left Cardiac Catheterization 06/22/2022: Conclusions: Severe single-vessel coronary artery disease with heavily calcified mid LAD stenosis of up to 90%. Mild to moderate, ostial LMCA, proximal LAD, OM2, and proximal RCA disease. Moderately to severely reduced left ventricular systolic function with mid/apical anterior and apical hypokinesis/akinesis. Moderately elevated left ventricular filling pressure (LVEDP ~25 mmHg).   Recommendations: Images reviewed with Dr. Allyson Sabal.  Given worsening anemia of uncertain etiology, recommend further workup of declining hemoglobin.  IV heparin will be restarted in 4 hours in the setting of ACS and PAF.  Defer adding P2Y12 inhibitor at this time. Aggressive secondary prevention. If no active bleeding is identified and hemoglobin  stabilizes, recommend PCI/atherectomy to mid LAD. Optimize evidence-based heart failure therapy.  Diagnostic Dominance:  Right      Echocardiogram 06/23/2020: Impressions: 1. Left ventricular ejection fraction, by estimation, is 55 to 60%. The  left ventricle has normal function. The left ventricle has no regional  wall motion abnormalities. Left ventricular diastolic function could not  be evaluated.   2. Right ventricular systolic function is normal. The right ventricular  size is normal. There is moderately elevated pulmonary artery systolic  pressure. The estimated right ventricular systolic pressure is 58.1 mmHg.   3. Left atrial size was mildly dilated.   4. The mitral valve is normal in structure. Trivial mitral valve  regurgitation. No evidence of mitral stenosis.   5. Tricuspid valve regurgitation is moderate.   6. The aortic valve is tricuspid. There is mild calcification of the  aortic valve. There is moderate thickening of the aortic valve. Aortic  valve regurgitation is trivial. Mild to moderate aortic valve  sclerosis/calcification is present, without any  evidence of aortic stenosis.   7. The inferior vena cava is dilated in size with >50% respiratory  variability, suggesting right atrial pressure of 8 mmHg.  _______________  Coronary Stent Intervention 06/24/2020: Suezanne Jacquet LM lesion is 30% stenosed. Prox LAD lesion is 25% stenosed. 2nd Diag lesion is 50% stenosed. Mid LAD lesion is 90% stenosed. 2nd Mrg lesion is 35% stenosed. Post intervention, there is a 0% residual stenosis. A drug-eluting stent was successfully placed using a STENT RESOLUTE ONYX [id number].   Successful OCT guided orbital atherectomy and drug-eluting stent placement to the mid LAD.  Overall, very difficult procedure due to heavy calcifications and difficulty expanding the stent in the proximal segment of the stent.  The stent was fully dilated with a 3.5 noncompliant balloon to high pressure.   Recommendations: I discontinued unfractionated heparin drip. The patient is already on dual antiplatelet therapy with aspirin  and clopidogrel which should be continued for at least 6 months.   I do not think he is a good candidate for long-term anticoagulation considering his anemia and GI bleed.  I discussed with Dr. Allyson Sabal.   Diagnostic Dominance: Right  Intervention       Laboratory Data:  High Sensitivity Troponin:   Recent Labs  Lab 12/15/22 1840 12/15/22 2109 12/17/22 2025 12/17/22 2213  TROPONINIHS 9 10 30* 32*     Chemistry Recent Labs  Lab 12/15/22 1840 12/15/22 1849 12/17/22 2025 12/18/22 0945  NA 134* 134* 135 139  K 3.7 3.6 3.2* 3.6  CL 99 99 101 104  CO2 23  --  21* 22  GLUCOSE 118* 114* 139* 130*  BUN 12 12 14 15   CREATININE 0.86 0.80 0.80 0.78  CALCIUM 9.0  --  8.9 8.7*  MG  --   --  2.1 2.2  GFRNONAA >60  --  >60 >60  ANIONGAP 12  --  13 13    Recent Labs  Lab 12/15/22 1840 12/17/22 2025  PROT 6.5 6.8  ALBUMIN 3.4* 3.0*  AST 25 20  ALT 21 18  ALKPHOS 49 49  BILITOT 0.3 0.7   Lipids No results for input(s): "CHOL", "TRIG", "HDL", "LABVLDL", "LDLCALC", "CHOLHDL" in the last 168 hours.  Hematology Recent Labs  Lab 12/15/22 1840 12/15/22 1849 12/17/22 2025 12/18/22 0945  WBC 10.1  --  20.7* 16.3*  RBC 3.61*  --  3.97*  4.03* 3.83*  HGB 11.6* 12.6* 12.9* 12.3*  HCT 35.9* 37.0* 38.5* 37.6*  MCV 99.4  --  97.0 98.2  MCH 32.1  --  32.5 32.1  MCHC 32.3  --  33.5 32.7  RDW 12.2  --  12.2 12.3  PLT 197  --  230 204   Thyroid  Recent Labs  Lab 12/17/22 2025  TSH 6.177*    BNPNo results for input(s): "BNP", "PROBNP" in the last 168 hours.  DDimer No results for input(s): "DDIMER" in the last 168 hours.   Radiology/Studies:  DG CHEST PORT 1 VIEW  Result Date: 12/17/2022 CLINICAL DATA:  Fevers EXAM: PORTABLE CHEST 1 VIEW COMPARISON:  12/15/2022 FINDINGS: Cardiac shadow is stable. Aortic calcifications are noted. The lungs are well aerated bilaterally. No focal infiltrate or effusion is seen. No bony abnormality is noted. IMPRESSION: No acute abnormality  seen. Electronically Signed   By: Alcide Clever M.D.   On: 12/17/2022 22:15   CT HEAD WO CONTRAST ( )  Result Date: 12/15/2022 CLINICAL DATA:  Fall.  Subarachnoid hemorrhage. EXAM: CT HEAD WITHOUT CONTRAST TECHNIQUE: Contiguous axial images were obtained from the base of the skull through the vertex without intravenous contrast. RADIATION DOSE REDUCTION: This exam was performed according to the departmental dose-optimization program which includes automated exposure control, adjustment of the mA and/or kV according to patient size and/or use of iterative reconstruction technique. COMPARISON:  Earlier CT dated 12/15/2022. FINDINGS: Evaluation of this exam is limited due to motion artifact. Brain: Mild age-related atrophy and moderate chronic microvascular ischemic changes. No significant interval change in the intracranial hemorrhages seen on the earlier CT. No new hemorrhage. No mass effect or midline shift. Vascular: No hyperdense vessel or unexpected calcification. Skull: Normal. Negative for fracture or focal lesion. Sinuses/Orbits: No acute finding. Other: Laceration of the right forehead. IMPRESSION: No significant interval change in the intracranial hemorrhages seen on the earlier CT. Continued close follow-up recommended. No new hemorrhage. Electronically Signed   By: Elgie Collard M.D.   On: 12/15/2022 21:09   DG Pelvis Portable  Result Date: 12/15/2022 CLINICAL DATA:  Mechanical fall hitting head on counter. EXAM: PORTABLE PELVIS 1-2 VIEWS COMPARISON:  None Available. FINDINGS: The cortical margins of the bony pelvis are intact. No fracture. Pubic symphysis and sacroiliac joints are congruent. Both femoral heads are well-seated in the respective acetabula. Brachytherapy seeds in the prostate. There is a sclerotic focus in the left pelvis above the acetabulum. IMPRESSION: 1. No pelvic fracture. 2. Sclerotic focus in the left pelvis is nonspecific in the setting of prior prostate cancer. Recommend  correlation with any outside imaging if available to assess for stability. Electronically Signed   By: Narda Rutherford M.D.   On: 12/15/2022 19:41   DG Chest Port 1 View  Result Date: 12/15/2022 CLINICAL DATA:  Mechanical fall hitting head on counter. EXAM: PORTABLE CHEST 1 VIEW COMPARISON:  06/21/2020 FINDINGS: The heart is normal in size. Coronary stent visualized. Mediastinal contours are unchanged. No pneumothorax, large pleural effusion, or focal airspace disease. On limited assessment, no acute osseous abnormalities are seen. IMPRESSION: No acute chest findings. Electronically Signed   By: Narda Rutherford M.D.   On: 12/15/2022 19:34   CT HEAD WO CONTRAST  Result Date: 12/15/2022 CLINICAL DATA:  Trauma EXAM: CT HEAD WITHOUT CONTRAST TECHNIQUE: Contiguous axial images were obtained from the base of the skull through the vertex without intravenous contrast. RADIATION DOSE REDUCTION: This exam was performed according to the departmental dose-optimization program which includes automated exposure control, adjustment of the mA and/or kV according to patient size and/or use of iterative reconstruction technique. COMPARISON:  Head CT 06/03/2013 FINDINGS: Brain: There are scattered areas of subarachnoid hemorrhage in the left occipital lobe, bilateral high frontal lobes, and right temporal lobe. Small amount of subarachnoid hemorrhage is noted posterior to the right midbrain extending into the cerebral aqueduct. No extra-axial fluid collection or acute infarct. No mass effect or midline shift. No hydrocephalus. There is new mild periventricular white matter hypodensity, likely chronic small vessel ischemic change. Vascular: Atherosclerotic calcifications are present within the cavernous internal carotid arteries. Skull: Normal. Negative for fracture or focal lesion. Sinuses/Orbits: No acute finding. Other: None. IMPRESSION: 1. Scattered areas of subarachnoid hemorrhage in the left occipital lobe, bilateral high  frontal lobes, and right temporal lobe. 2. Small amount of subarachnoid hemorrhage posterior to the right midbrain extending into the cerebral aqueduct. No hydrocephalus. These results were called by telephone at the time of interpretation on 12/15/2022 at 7:17 pm to provider [facility] , who verbally acknowledged these results. Electronically Signed   By: Darliss Cheney M.D.   On: 12/15/2022 19:17   CT CERVICAL SPINE WO CONTRAST  Result Date: 12/15/2022 CLINICAL DATA:  Polytrauma, blunt EXAM: CT CERVICAL SPINE WITHOUT CONTRAST TECHNIQUE: Multidetector CT imaging of the cervical spine was performed without intravenous contrast. Multiplanar CT image reconstructions were also generated. RADIATION DOSE REDUCTION: This exam was performed according to the departmental dose-optimization program which includes automated exposure control, adjustment of the mA and/or kV according to patient size and/or use of iterative reconstruction technique. COMPARISON:  None Available. FINDINGS: Alignment: Slight degenerative anterolisthesis of C7 on T1 and T1 on T2. Skull base and vertebrae: No acute fracture. No primary bone lesion or focal pathologic process. Soft tissues and spinal canal: No prevertebral fluid or swelling. No visible canal hematoma. Disc levels: Bony fusion across the disc spaces from C3-4 through C5-6. Degenerative disc disease at C6-7. Advanced bilateral degenerative facet disease. Upper chest: No acute findings Other: None IMPRESSION: Degenerative disc and facet disease.  Fusion from C3-4 through C5-6. No acute bony abnormality. Electronically Signed   By: Charlett Nose M.D.   On: 12/15/2022 19:07     Assessment and Plan:   Atrial Fibrillation with RVR Patient has a history of paroxysmal atrial fibrillation but has not been on anticoagulation given advanced age, frailty, and frequent falls. He has been seen by EP in the past and not felt to be a candidate for Watchman. He is now admitted for traumatic  subarachnoid hemorrhage after fall. He initially presented in normal sinus rhythm but is now back into atrial fibrillation. - Rates currently in the 90s to low 100s. However, RN states that rates will increase to th 140s to 160s when he gets agitated. RN states he responded well to IV Lopressor. - Potassium 3.6 and Magnesium 2.2.  - TSH 6.177. Management per primary team. - Echo pending. - He is on Toprol-XL 50mg  daily at home but this was held on admission due to altered mental status and inability to take PO medications. Resume when able to take PO medications. In the meantime, can continue to use IV Lopressor as needed. - Not an anticoagulation candidate given frequent falls and current subarachnoid hemorrhage. Therefore, will need to focus on rate.   CAD History of NSTEMI in 2022 s/p DES to mid LAD. High-sensitivity troponin minimally elevated and flat on admission at 30 >> 32. - Resume Aspirin when okay with Neurosurgery. - Resume statin when able to take PO medications. - Troponin elevation not consistent with demand ischemia. No plans for ischemic evaluation.  Hypertension BP reasonably well controlled.  - Continue IV Lopressor as needed.  Otherwise, per primary team: - Traumatic subarachnoid hemorrhage - Acute metabolic encephalopathy - COPD/ asthma - Anemia - Acquired hypothyroidism - Hypokalemia  Risk Assessment/Risk Scores:  { CHA2DS2-VASc Score = 4  This indicates a 4.8% annual risk of stroke. The patient's score is based upon: CHF History: 0 HTN History: 1 Diabetes History: 0 Stroke History: 0 Vascular Disease History: 1 Age Score: 2 Gender Score: 0 For questions or updates, please contact [facility] Please consult [email or web address] for contact info under  Signed, Corrin Parker, PA-C  12/18/2022 1:57 PM  Patient seen and examined, note reviewed with the signed Advanced Practice Provider. I personally reviewed laboratory data, imaging studies and  relevant notes. I independently examined the patient and formulated the important aspects of the plan. I have personally discussed the plan with the patient and/or family. Comments or changes to the note/plan are indicated below.  Patient seen and examined at his bedside. His daughter at bedside. He opens his eyes to voice but did not interact with me. He is wear had mittens and suspect delirium is playing a role here with is agitation.   PAF - intermittent afib rvr there have been some sinus beats plus he presented in SR. He was off his metoprolol for several days. Agree with starting the IV Lopressor until he is able to take po meds. Not an anticoagulation candidate given frequent falls and current subarachnoid hemorrhage.   CAD - no anginal symptoms.  Hypertension - slightly elevated, but lopressor has been started as well.    Thomasene Ripple DO, MS [facility] Attending Cardiologist [facility]  [address] [place], [place] [address] [phone] Website: [email or web address]

## 2022-12-18 NOTE — Evaluation (Signed)
Clinical/Bedside Swallow Evaluation Patient Details  Name: James Natale Sr. MRN: [id number] Date of Birth: 1931/12/15  Today's Date: 12/18/2022 Time: SLP Start Time (ACUTE ONLY): 1137 SLP Stop Time (ACUTE ONLY): 1157 SLP Time Calculation (min) (ACUTE ONLY): 20 min  Past Medical History:  Past Medical History:  Diagnosis Date   Acquired hypothyroidism 08/15/2019   Acute systolic heart failure (HCC) 06/25/2020   Anemia 02/05/2020   boarderline anemic   Atopic dermatitis 12/05/2019   Bacterial pneumonia, unspecified 06/14/2012   06/14/2012 CXR w/ RML PNA >Zithromax and Rocephin     Bradycardia 11/28/2018   CAD (coronary artery disease) 07/07/2020   Cancer (HCC)    prostate cancer-seed implant   Closed fracture dislocation of right elbow    Closed fracture of right olecranon process 04/10/2020   COPD (chronic obstructive pulmonary disease) (HCC) 01/21/2020   Dermatitis 02/05/2020   Dizziness 02/19/2016   Essential hypertension 11/28/2018   History of prostate cancer    seed implant   Hyperlipemia    Hyperlipidemia, mixed 08/21/2008   Qualifier: Diagnosis of  By: Maple Hudson MD, Clinton D    LVH (left ventricular hypertrophy) 11/28/2018   Malnutrition of moderate degree (HCC) 08/15/2019   Mixed conductive and sensorineural hearing loss, 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   Moderate persistent asthma, uncomplicated 01/21/2020   Nonsustained ventricular tachycardia (HCC) 01/30/2019   NSTEMI (non-ST elevated myocardial infarction) (HCC) 06/21/2020   Other fatigue 02/05/2020   PAF (paroxysmal atrial fibrillation) (HCC) 06/25/2020   Seasonal and perennial allergic rhinitis 05/17/2007       Senile osteoporosis 08/15/2019   SINUSITIS, ACUTE 06/28/2007   Qualifier: Diagnosis of  By: Clent Ridges NP, Tammy     Ventricular ectopy 11/28/2018   Past Surgical History:  Past Surgical History:  Procedure Laterality Date   APPENDECTOMY     CORONARY ATHERECTOMY N/A 06/24/2020   Procedure: CORONARY ATHERECTOMY;  Surgeon: Iran Ouch,  MD;  Location: [facility];  Service: Cardiovascular;  Laterality: N/A;   CORONARY ULTRASOUND/IVUS N/A 06/24/2020   Procedure: Intravascular Ultrasound/IVUS;  Surgeon: Iran Ouch, MD;  Location: [facility];  Service: Cardiovascular;  Laterality: N/A;   LEFT HEART CATH AND CORONARY ANGIOGRAPHY N/A 06/22/2020   Procedure: LEFT HEART CATH AND CORONARY ANGIOGRAPHY;  Surgeon: Yvonne Kendall, MD;  Location: [facility];  Service: Cardiovascular;  Laterality: N/A;   ORIF ELBOW FRACTURE Right 04/10/2020   Procedure: OPEN REDUCTION INTERNAL FIXATION (ORIF) ELBOW/OLECRANON FRACTURE;  Surgeon: Teryl Lucy, MD;  Location: [facility];  Service: Orthopedics;  Laterality: Right;   HPI:  Pt is an 89 y.o. male who presented with AMS after a fall. CT head: Scattered areas of subarachnoid hemorrhage in the left occipital  lobe, bilateral high frontal lobes, and right temporal lobe. Small amount of subarachnoid hemorrhage posterior to the right midbrain extending into the cerebral aqueduct. PMH: COPD, prior NSTEMI / CHF, NSVT, PAF, on ASA81.    Assessment / Plan / Recommendation  Clinical Impression  Pt was seen for bedside swallow evaluation with his daughter and son-in-law present who denied the pt having a history of dysphagia. Pt roused with verbal and tactile stimulation; he was initially combative with SLP and family and resistant to p.o. intake, but this improved with time and encouragement. Pt was unable to participate in a complete oral mechanism exam, but oral inspection revealed adequate, natural dentition and a dry oral mucosa. Pt exhibited reduced bolus awareness with intermittent oral holding and absent mastication. However, no s/s of  aspiration were noted with solids or liquids via cup/straw and awareness improved with verbal prompts for bolus manipulation. Pt's dysphagia appears cognitively-based and considering the level of support that was necessary for p.o.  intake, SLP questions how much pt will consume. His current diet of full liquids will be continued, but if mentation does not improve significantly, SLP anticipates that enteral nutrition may be necessary at least for supplementation. SLP will follow to ensure tolerance and to assess improvement in swallow function. SLP Visit Diagnosis: Dysphagia, unspecified (R13.10)    Aspiration Risk  Mild aspiration risk    Diet Recommendation Thin liquid (continue full liquids)    Liquid Administration via: Cup;Straw;Spoon Medication Administration: Crushed with puree Supervision: Full supervision/cueing for compensatory strategies Compensations: Minimize environmental distractions;Slow rate;Small sips/bites Postural Changes: Seated upright at 90 degrees    Other  Recommendations Oral Care Recommendations: Oral care BID    Recommendations for follow up therapy are one component of a multi-disciplinary discharge planning process, led by the attending physician.  Recommendations may be updated based on patient status, additional functional criteria and insurance authorization.  Follow up Recommendations  (TBD)      Assistance Recommended at Discharge    Functional Status Assessment Patient has had a recent decline in their functional status and demonstrates the ability to make significant improvements in function in a reasonable and predictable amount of time.  Frequency and Duration min 2x/week  2 weeks       Prognosis Prognosis for improved oropharyngeal function: Good Barriers to Reach Goals: Cognitive deficits      Swallow Study   General Date of Onset: 12/17/22 HPI: Pt is an 89 y.o. male who presented with AMS after a fall. CT head: Scattered areas of subarachnoid hemorrhage in the left occipital  lobe, bilateral high frontal lobes, and right temporal lobe. Small amount of subarachnoid hemorrhage posterior to the right midbrain extending into the cerebral aqueduct. PMH: COPD, prior NSTEMI  / CHF, NSVT, PAF, on ASA81. Type of Study: Bedside Swallow Evaluation Previous Swallow Assessment: none Diet Prior to this Study: Full liquid diet Temperature Spikes Noted: No Respiratory Status: Room air History of Recent Intubation: No Behavior/Cognition: Alert;Pleasant mood;Cooperative Oral Cavity Assessment: Within Functional Limits Oral Care Completed by SLP: No Oral Cavity - Dentition: Adequate natural dentition Vision: Functional for self-feeding Self-Feeding Abilities: Total assist Patient Positioning: Upright in bed Baseline Vocal Quality: Low vocal intensity Volitional Cough: Cognitively unable to elicit Volitional Swallow: Unable to elicit    Oral/Motor/Sensory Function Overall Oral Motor/Sensory Function:  (UTA)   Ice Chips Ice chips: Impaired Presentation: Spoon Oral Phase Impairments: Poor awareness of bolus   Thin Liquid Thin Liquid: Impaired Presentation: Cup;Spoon;Straw Oral Phase Impairments: Poor awareness of bolus Oral Phase Functional Implications: Oral holding;Right anterior spillage    Nectar Thick Nectar Thick Liquid: Not tested   Honey Thick Honey Thick Liquid: Not tested   Puree Puree: Impaired Presentation: Spoon Oral Phase Impairments: Poor awareness of bolus Pharyngeal Phase Impairments: Suspected delayed Swallow   Solid     Solid: Impaired Oral Phase Impairments: Impaired mastication Oral Phase Functional Implications: Impaired mastication      I. Vear Clock, MS, CCC-SLP Acute Rehabilitation Services Office number [phone]  Scheryl Marten 12/18/2022,1:14 PM

## 2022-12-18 NOTE — Progress Notes (Signed)
    Providing Compassionate, Quality Care - Together   NEUROSURGERY PROGRESS NOTE     S: No issues overnight.    O: EXAM:  BP 117/72   Pulse 83   Temp 98.6 F (37 C) (Axillary)   Resp 20   Ht 6' (1.829 m)   Wt 63.5 kg   SpO2 97%   BMI 18.99 kg/m      More alert today Clear, fluent speech albeit inappropriate PERRL MAEs x4, Fcx4 4/5 strength diffusely BUE/BLE   ASSESSMENT:  89 y.o. s/p fall with AMS and scattered tSAH on CTH with improving neurologic exam   PLAN: -Continue supportive care, therapies -Call w/ questions/concerns. -Start SQH for dvt ppx today.    Patrici Ranks, [facility]

## 2022-12-18 NOTE — Progress Notes (Signed)
PROGRESS NOTE    James Odonald Sr.  ZOX:[id number] DOB: 1931/07/22 DOA: 12/15/2022 PCP: Blane Ohara, MD    Brief Narrative:  James Olivos Sr. is a 89 y.o. male with past medical history of Nonsustained V. tach, A-fib, ischemic cardiomyopathy (HFrEF with improved EF 35% improved to 55% 2022) HTN, COPD, hypothyroidism, chronic anemia, frequent falls currently admitted to the neurosurgical service for altered mental status secondary to traumatic subarachnoid hemorrhage, had  runs of A-fib and frequent PVCs on the monitor but was asymptomatic.  Patient was last hospitalized back in February 2022 with NSTEMI with cath showing severe single-vessel disease (heavily calcified mid LAD stenosis up to 90%) which was stented . Recommendation was made for aggressive secondary prevention. Patient was not anticoagulated despite CHA2DS2-VASc score of 4 due to anemia, frail state and frequent falls.  He did not receive a GI workup for his anemia due to frailty.  At this time, patient was noted to be febrile with a temperature max of 100.3 F.   EKG 8/8, personally viewed and interpreted showed sinus rhythm at 82 with nonspecific ST-T wave changes.  Hospitalist team was consulted for further management.  Assessment and plan.  Traumatic subarachnoid hemorrhage  History of frequent falls Acute metabolic encephalopathy Patient admitted on 12/15/22 to the neurosurgical service with traumatic subarachnoid hemorrhage, managed conservatively.  Management as per neurosurgery.  On labetalol for blood pressure management.  Patient is on full liquid diet but is unable to follow commands.  Will consider for feeding tube placement so that he can get hydration nutrition and oral medication.  Minimize narcotics if possible.  Tylenol for fever.   SIRS (systemic inflammatory response syndrome) Patient had fever with tachycardia.  COVID and influenza RSV negative.  Procalcitonin 0.9.  Lactic acid at 1.8.  Urinalysis with  negative leukocytes.  Chest x-ray without any acute findings.  Initial CBC with WBC at 20.7.  Will repeat CBC BMP today.  On IV fluids due to unstable p.o. status.  Will get speech therapy evaluation.  Folate level of 23.  Vitamin B12 at 220.  Not considering antibiotic yet.   PAF (paroxysmal atrial fibrillation) with RVR. Frequent PVCs with history of NSVT High bleeding risk due to history of falls Not on anticoagulation.  Focus on electrolyte replacement.  Check BMP from today.  TSH slightly elevated.  Will need outpatient follow-up.  Continue metoprolol.Defer aspirin to neurosurgery given recent SAH.  Continue telemetry monitoring.  2D echocardiogram from February 2022 with LV ejection fraction of 55 to 60%.  Repeat echo.  Will consult cardiology due to history of significant cardiac history A-fib with RVR in a patient with difficult anticoagulation situation.  On metoprolol at home.  Will give 1 dose of IV metoprolol improved as needed for now.  Will try to put in a feeding tube to see if oral medications could be initiated.   CAD S/P LAD stent 2022 HFrEF with improved EF(35>55%, 2022)/ischemic cardiomyopathy Elevated troponin but flat.  Continue metoprolol Lipitor.Will defer continuation of aspirin to neurosurgery.  Will check echocardiogram.   Hypokalemia On KCl with IV fluids.  Check BMP from today.     Anemia Iron level of 10.  Latest hemoglobin 12.9.  Transfuse as necessary.  Moderate persistent asthma, uncomplicated Continue DuoNebs.   Acquired hypothyroidism TSH of 6.1. Continue levothyroxine 175 daily   Essential hypertension Continue metoprolol    DVT prophylaxis: heparin injection 5,000 Units Start: 12/18/22 1000 SCDs Start: 12/15/22 2202   Code Status:  Code Status: Full Code  Disposition: Uncertain at this time.  Status is: Inpatient  Remains inpatient appropriate because: Encephalopathy, traumatic intracranial hemorrhage, SIRS   Family Communication: Spoke  with the patient's daughter at bedside at length..  Procedures:  None yet.  Antimicrobials:  None yet  Anti-infectives (From admission, onward)    None       Subjective: Today, patient was seen and examined at bedside.  Patient's daughter at bedside.  Concerned about increasing heart rate, not getting oral medication.  Confused and disoriented on mittens.  Objective: Vitals:   12/17/22 1953 12/18/22 0000 12/18/22 0400 12/18/22 0800  BP: (!) 151/72 (!) 140/72 (!) 143/90   Pulse: (!) 104 85 94   Resp: 16 (!) 21 (!) 21   Temp: 98.2 F (36.8 C) 98.5 F (36.9 C) (!) 97.4 F (36.3 C) 98.4 F (36.9 C)  TempSrc: Oral Oral Axillary Oral  SpO2: 91% 94% 94%   Weight:      Height:        Intake/Output Summary (Last 24 hours) at 12/18/2022 1055 Last data filed at 12/18/2022 0100 Gross per 24 hour  Intake 677.63 ml  Output 220 ml  Net 457.63 ml   Filed Weights   12/15/22 1841  Weight: 63.5 kg    Physical Examination: Body mass index is 18.99 kg/m.   General:   not in obvious distress, thinly built, restless and confused disoriented, elderly male, frail appearing, HENT:   No scleral pallor or icterus noted. Oral mucosa is moist.  Laceration over the right supraorbital region. Chest:   Diminished breath sounds bilaterally. No crackles or wheezes.  CVS: S1 &S2 heard. No murmur.  Regular rate and rhythm. Abdomen: Soft, nontender, nondistended.  Bowel sounds are heard.   Extremities: No cyanosis, clubbing or edema.  Peripheral pulses are palpable. Psych: Restless, confused and disoriented, clear speech but inappropriate. CNS: Moving extremities, generalized weakness noted Skin: Warm and dry.  No rashes noted.  Data Reviewed:   CBC: Recent Labs  Lab 12/15/22 1840 12/15/22 1849 12/17/22 2025  WBC 10.1  --  20.7*  HGB 11.6* 12.6* 12.9*  HCT 35.9* 37.0* 38.5*  MCV 99.4  --  97.0  PLT 197  --  230    Basic Metabolic Panel: Recent Labs  Lab 12/15/22 1840  12/15/22 1849 12/17/22 2025  NA 134* 134* 135  K 3.7 3.6 3.2*  CL 99 99 101  CO2 23  --  21*  GLUCOSE 118* 114* 139*  BUN 12 12 14   CREATININE 0.86 0.80 0.80  CALCIUM 9.0  --  8.9  MG  --   --  2.1    Liver Function Tests: Recent Labs  Lab 12/15/22 1840 12/17/22 2025  AST 25 20  ALT 21 18  ALKPHOS 49 49  BILITOT 0.3 0.7  PROT 6.5 6.8  ALBUMIN 3.4* 3.0*     Radiology Studies: DG CHEST PORT 1 VIEW  Result Date: 12/17/2022 CLINICAL DATA:  Fevers EXAM: PORTABLE CHEST 1 VIEW COMPARISON:  12/15/2022 FINDINGS: Cardiac shadow is stable. Aortic calcifications are noted. The lungs are well aerated bilaterally. No focal infiltrate or effusion is seen. No bony abnormality is noted. IMPRESSION: No acute abnormality seen. Electronically Signed   By: Alcide Clever M.D.   On: 12/17/2022 22:15      LOS: 2 days     Joycelyn Das, MD [facility] Available via Epic secure chat 7am-7pm After these hours, please refer to coverage provider listed on [email or web address] 12/18/2022, 10:55  AM

## 2022-12-19 ENCOUNTER — Inpatient Hospital Stay (HOSPITAL_COMMUNITY): Payer: Medicare HMO

## 2022-12-19 DIAGNOSIS — I5022 Chronic systolic (congestive) heart failure: Secondary | ICD-10-CM | POA: Diagnosis not present

## 2022-12-19 DIAGNOSIS — R339 Retention of urine, unspecified: Secondary | ICD-10-CM

## 2022-12-19 DIAGNOSIS — I10 Essential (primary) hypertension: Secondary | ICD-10-CM | POA: Diagnosis not present

## 2022-12-19 DIAGNOSIS — Z7189 Other specified counseling: Secondary | ICD-10-CM | POA: Diagnosis not present

## 2022-12-19 DIAGNOSIS — I4891 Unspecified atrial fibrillation: Secondary | ICD-10-CM

## 2022-12-19 DIAGNOSIS — G9341 Metabolic encephalopathy: Secondary | ICD-10-CM | POA: Diagnosis not present

## 2022-12-19 DIAGNOSIS — Z515 Encounter for palliative care: Secondary | ICD-10-CM | POA: Diagnosis not present

## 2022-12-19 DIAGNOSIS — I609 Nontraumatic subarachnoid hemorrhage, unspecified: Secondary | ICD-10-CM | POA: Diagnosis not present

## 2022-12-19 DIAGNOSIS — S066XAA Traumatic subarachnoid hemorrhage with loss of consciousness status unknown, initial encounter: Secondary | ICD-10-CM | POA: Diagnosis not present

## 2022-12-19 DIAGNOSIS — I480 Paroxysmal atrial fibrillation: Secondary | ICD-10-CM | POA: Diagnosis not present

## 2022-12-19 DIAGNOSIS — R296 Repeated falls: Secondary | ICD-10-CM | POA: Diagnosis not present

## 2022-12-19 DIAGNOSIS — E43 Unspecified severe protein-calorie malnutrition: Secondary | ICD-10-CM | POA: Insufficient documentation

## 2022-12-19 LAB — BASIC METABOLIC PANEL WITH GFR
Anion gap: 11 (ref 5–15)
BUN: 16 mg/dL (ref 8–23)
CO2: 18 mmol/L — ABNORMAL LOW (ref 22–32)
Calcium: 8.5 mg/dL — ABNORMAL LOW (ref 8.9–10.3)
Chloride: 110 mmol/L (ref 98–111)
Creatinine, Ser: 0.86 mg/dL (ref 0.61–1.24)
GFR, Estimated: >60 mL/min
Glucose, Bld: 133 mg/dL — ABNORMAL HIGH (ref 70–99)
Potassium: 4 mmol/L (ref 3.5–5.1)
Sodium: 139 mmol/L (ref 135–145)

## 2022-12-19 LAB — CBC
HCT: 35 % — ABNORMAL LOW (ref 39.0–52.0)
Hemoglobin: 11.4 g/dL — ABNORMAL LOW (ref 13.0–17.0)
MCH: 31.1 pg (ref 26.0–34.0)
MCHC: 32.6 g/dL (ref 30.0–36.0)
MCV: 95.6 fL (ref 80.0–100.0)
Platelets: 230 10*3/uL (ref 150–400)
RBC: 3.66 MIL/uL — ABNORMAL LOW (ref 4.22–5.81)
RDW: 12.3 % (ref 11.5–15.5)
WBC: 17.1 10*3/uL — ABNORMAL HIGH (ref 4.0–10.5)
nRBC: 0 % (ref 0.0–0.2)

## 2022-12-19 LAB — MAGNESIUM
Magnesium: 2.2 mg/dL (ref 1.7–2.4)
Magnesium: 2.3 mg/dL (ref 1.7–2.4)

## 2022-12-19 LAB — GLUCOSE, CAPILLARY
Glucose-Capillary: 101 mg/dL — ABNORMAL HIGH (ref 70–99)
Glucose-Capillary: 107 mg/dL — ABNORMAL HIGH (ref 70–99)
Glucose-Capillary: 105 mg/dL — ABNORMAL HIGH (ref 70–99)

## 2022-12-19 LAB — PHOSPHORUS: Phosphorus: 2 mg/dL — ABNORMAL LOW (ref 2.5–4.6)

## 2022-12-19 MED ORDER — ACETAMINOPHEN 650 MG RE SUPP: 650.0000 mg | RECTAL | Status: DC | PRN

## 2022-12-19 MED ORDER — ONDANSETRON HCL 4 MG/2ML IJ SOLN: 4.0000 mg | INTRAMUSCULAR | Status: DC | PRN

## 2022-12-19 MED ORDER — METOPROLOL TARTRATE 5 MG/5ML IV SOLN
5.0000 mg | Freq: Every day | INTRAVENOUS | Status: DC | PRN
  Administered 2022-12-19 – 2023-01-15 (×3): 5 mg via INTRAVENOUS
  Filled 2022-12-19 (×3): qty 5

## 2022-12-19 MED ORDER — THIAMINE MONONITRATE 100 MG PO TABS
100.0000 mg | ORAL_TABLET | Freq: Every day | ORAL | Status: AC
  Administered 2022-12-19 – 2022-12-23 (×5): 100 mg
  Filled 2022-12-19 (×5): qty 1

## 2022-12-19 MED ORDER — POLYETHYLENE GLYCOL 3350 17 G PO PACK
17.0000 g | PACK | Freq: Every day | ORAL | Status: DC | PRN
  Administered 2022-12-29 – 2023-01-15 (×4): 17 g
  Filled 2022-12-19 (×4): qty 1

## 2022-12-19 MED ORDER — SODIUM CHLORIDE 0.9 % IV SOLN
1.0000 g | INTRAVENOUS | Status: DC
  Administered 2022-12-19 – 2022-12-25 (×7): 1 g via INTRAVENOUS
  Filled 2022-12-19 (×8): qty 10

## 2022-12-19 MED ORDER — ONDANSETRON HCL 4 MG PO TABS
4.0000 mg | ORAL_TABLET | ORAL | Status: DC | PRN
  Administered 2022-12-22: 4 mg
  Filled 2022-12-19: qty 1

## 2022-12-19 MED ORDER — ACETAMINOPHEN 325 MG PO TABS
650.0000 mg | ORAL_TABLET | ORAL | Status: DC | PRN
  Administered 2022-12-24 – 2023-01-03 (×5): 650 mg
  Filled 2022-12-19 (×5): qty 2

## 2022-12-19 MED ORDER — CYANOCOBALAMIN 1000 MCG/ML IJ SOLN
1000.0000 ug | Freq: Once | INTRAMUSCULAR | Status: AC
  Administered 2022-12-19: 1000 ug via INTRAMUSCULAR
  Filled 2022-12-19: qty 1

## 2022-12-19 MED ORDER — LIDOCAINE HCL URETHRAL/MUCOSAL 2 % EX GEL
1.0000 | Freq: Once | CUTANEOUS | Status: AC
  Administered 2022-12-19: 1 via URETHRAL
  Filled 2022-12-19: qty 6

## 2022-12-19 MED ORDER — DOCUSATE SODIUM 50 MG/5ML PO LIQD
100.0000 mg | Freq: Two times a day (BID) | ORAL | Status: DC
  Administered 2022-12-20 – 2023-01-19 (×51): 100 mg
  Filled 2022-12-19 (×56): qty 10

## 2022-12-19 MED ORDER — OSMOLITE 1.2 CAL PO LIQD: 1000.0000 mL | ORAL | Status: DC

## 2022-12-19 MED ORDER — OSMOLITE 1.2 CAL PO LIQD
1000.0000 mL | ORAL | Status: DC
  Administered 2022-12-19 – 2023-01-08 (×19): 1000 mL
  Filled 2022-12-19 (×3): qty 1000

## 2022-12-19 MED ORDER — METOPROLOL TARTRATE 25 MG PO TABS
25.0000 mg | ORAL_TABLET | Freq: Two times a day (BID) | ORAL | Status: DC
  Administered 2022-12-20 – 2022-12-25 (×12): 25 mg
  Filled 2022-12-19: qty 2
  Filled 2022-12-19 (×10): qty 1
  Filled 2022-12-19: qty 2

## 2022-12-19 NOTE — Care Management Important Message (Signed)
Important Message  Patient Details  Name: James Picking Sr. MRN: [id number] Date of Birth: April 13, 1932   Medicare Important Message Given:  Yes     [name] 12/19/2022, 2:19 PM

## 2022-12-19 NOTE — Progress Notes (Signed)
Speech Language Pathology Treatment: Dysphagia  Patient Details Name: James Volker Sr. MRN: [id number] DOB: 1932-04-11 Today's Date: 12/19/2022 Time: 3086-5784 SLP Time Calculation (min) (ACUTE ONLY): 25 min  Assessment / Plan / Recommendation Clinical Impression  Pt seen for further PO trials today, although was minimally alert despite frequent cueing and presentation of noxious stimuli. SLP attempted brief oral care with oral swab, which pt bit down on and only intermittently followed commands to open his mouth given Max verbal cueing. As RN began repositioning to provide IV medications, pt became slightly more alert and accepted ice chip into his oral cavity. He demonstrated extensive oral holding, requiring Max verbal and tactile cueing to initiate a swallow. No s/s of aspiration noted at bedside throughout these minimal trials. Pt's daughter reports he was more alert yesterday and had full liquids at lunch with no overt s/s of aspiration. Suspect pt's mentation significantly affects his ability to support himself nutritionally. Considering his lethargy today as well as the support required for pt to initiate swallow sequence, recommend NPO. Alternative means of nutrition may be necessary to ensure pt receives adequate hydration/nutrition. Will continue to follow as mentation improves to assess safe diet recommendations.    HPI HPI: Pt is an 89 y.o. male who presented with AMS after a fall. CT head: Scattered areas of subarachnoid hemorrhage in the left occipital  lobe, bilateral high frontal lobes, and right temporal lobe. Small amount of subarachnoid hemorrhage posterior to the right midbrain extending into the cerebral aqueduct. PMH: COPD, prior NSTEMI / CHF, NSVT, PAF, on ASA81.      SLP Plan  Continue with current plan of care      Recommendations for follow up therapy are one component of a multi-disciplinary discharge planning process, led by the attending physician.  Recommendations  may be updated based on patient status, additional functional criteria and insurance authorization.    Recommendations  Diet recommendations: Thin liquid Liquids provided via: Cup;Straw Medication Administration: Via alternative means Supervision: Staff to assist with self feeding;Full supervision/cueing for compensatory strategies Compensations: Minimize environmental distractions;Slow rate;Small sips/bites Postural Changes and/or Swallow Maneuvers: Seated upright 90 degrees;Upright 30-60 min after meal                  Oral care BID   Frequent or constant Supervision/Assistance Dysphagia, unspecified (R13.10)     Continue with current plan of care      Gwynneth Aliment, M.A., CF-SLP Speech Language Pathology, Acute Rehabilitation Services  Secure Chat preferred [phone]   12/19/2022, 9:41 AM

## 2022-12-19 NOTE — Progress Notes (Signed)
Initial Nutrition Assessment  DOCUMENTATION CODES:   Severe malnutrition in context of chronic illness  INTERVENTION:   Initiate enteral nutrition via Cortrak tube once placement confirmed via x-ray: - Start Osmolite 1.2 @ 25 ml/hr and titrate by 10 ml every 6 hours to goal rate of 65 ml/hr (1560 ml/day)  Tube feeding regimen at goal rate provides 1872 kcal, 87 grams of protein, and 1279 ml of H2O.  Monitor magnesium, potassium, and phosphorus every 12 hours for at least 6 occurrences. MD to replete as needed as [name] is at risk for refeeding syndrome given severe malnutrition, minimal nutrition since 12/15/22.  - Once IV fluids are discontinued, recommend ordering free water flushes of 150 ml every 6 hours for a total of 1879 ml free water daily  - Add thiamine 100 mg daily per tube due to high refeeding risk  NUTRITION DIAGNOSIS:   Severe Malnutrition related to chronic illness (COPD, HFrEF) as evidenced by severe fat depletion, severe muscle depletion.  GOAL:   Patient will meet greater than or equal to 90% of their needs  MONITOR:   Diet advancement, Labs, Weight trends, TF tolerance  REASON FOR ASSESSMENT:   Consult Assessment of nutrition requirement/status, Enteral/tube feeding initiation and management  ASSESSMENT:   89 year old male who presented to the ED on 8/08 after a fall with head trauma. PMH of COPD, prior NSTEMI, HFrEF, PAF, HTN, HLD. [name] admitted with SAH, acute metabolic encephalopathy.  08/09 - diet advanced to full liquids 08/11 - SLP evaluation, recommendation for full liquid diet 08/12 - SLP recommending NPO due to lethargy, Cortrak placed (x-ray pending)  Discussed [name] with RN and MD. Sherron Monday with [name]'s daughter in room with both RN and MD present. [name] consumed some pudding, potato soup, and other liquids via straw yesterday per [name]'s daughter. [name] did not eat anything Saturday due to lethargy. [name] ate some ice cream on Friday. Per SLP note today, recommendation  is for NPO with consideration of alternative means of nutrition. [name] minimally alert at time of RD visit; daughter shares that [name] was more alert yesterday. Plan for Cortrak tube placement today and initiation of enteral nutrition.  [name]'s daughter reports that [name] typically has a good appetite at baseline. He consumes 2-3 meals daily. [name] may have something simple like cereal for breakfast. For lunch, [name] and his wife receive meals from [employer] each weekday. [name]'s daughter ensures that [name] and wife have something for supper each night. [name]'s daughter does mention that [name]'s PO intake had been poor and that [name] had been sleeping more for the few days prior to his fall and presentation to the ED.  [name]'s daughter endorses recent weight loss. She states that he has always been thin but that his UBW is ~135 lbs. At MD appointment last week, [name] weighed 128 lbs. Weight on admission of 140 lbs appears to be stated rather than measured. Weight had been trending down steadily from Feburary 2024 to weight of 58.2 kg (128 lbs) obtained on 12/14/22. Based on NFPE, [name] meets criteria for severe malnutrition.  [name] at high risk for refeeding syndrome. Will start tube feeds at trickle rate and slowly advance to goal. Refeeding labs ordered. Will also add thiamine per tube x 5 days due to high refeeding risk.  Meal Completion: 50-60% x 2 documented meals  Medications reviewed and include: colace, IV pepcid IVF: NS with KCl 40 mEq/L @ 100 ml/hr  Labs reviewed: WBC 17.1  UOP: 400 ml x 24 hours  I/O's: +3.0 L since admit  NUTRITION - FOCUSED PHYSICAL EXAM:  Flowsheet Row Most Recent Value  Orbital Region Severe depletion  Upper Arm Region Severe depletion  Thoracic and Lumbar Region Severe depletion  Buccal Region Severe depletion  Temple Region Severe depletion  Clavicle Bone Region Severe depletion  Clavicle and Acromion Bone Region Severe depletion  Scapular Bone Region Severe depletion  Dorsal Hand Moderate  depletion  Patellar Region Moderate depletion  Anterior Thigh Region Severe depletion  Posterior Calf Region Moderate depletion  Edema (RD Assessment) None  Hair Reviewed  Eyes Reviewed  Mouth Reviewed  Skin Reviewed  Nails Reviewed    Diet Order:   Diet Order             Diet NPO time specified  Diet effective now                   EDUCATION NEEDS:   Education needs have been addressed (spoke with [name]'s daughter)  Skin:  Skin Assessment: Reviewed RN Assessment (laceration R eye)  Last BM:  12/19/22 medium type 6  Height:   Ht Readings from Last 1 Encounters:  12/16/22 6' (1.829 m)    Weight:   Wt Readings from Last 1 Encounters:  12/15/22 63.5 kg    Ideal Body Weight:  80.9 kg  BMI:  Body mass index is 18.99 kg/m.  Estimated Nutritional Needs:   Kcal:  1750-1950  Protein:  80-95 grams  Fluid:  1.7-1.9 L    Mertie Clause, MS, RD, LDN Registered Dietitian II Please see AMiON for contact information.

## 2022-12-19 NOTE — Progress Notes (Signed)
Echocardiogram 2D Echocardiogram has been performed.  [name] 12/19/2022, 9:07 AM

## 2022-12-19 NOTE — Progress Notes (Signed)
   Patient Name: James Sonn Sr. Date of Encounter: 12/19/2022 [facility] Cardiologist: Gypsy Balsam, MD   Interval Summary  .   89 y.o. male with a history of CAD with NSTEMI in 06/2020 s/p DES to mid LAD , paroxysmal atrial fibrillation not on anticoagulation given age and frailty, short runs of SVT and NSVT on monitor 12/2021, COPD/asthma, hypertension, hyperlipidemia, acquired hypothyroidism, and anemia followed now for atrial fib RVR with his fall and traumatic subarachnoid hemorrhage, acute metabolic encephalopathy.    Today he is in and out of atrial fib RVR vs atrial tach.?  Other times in SR to PVCs Does not open eyes or respond.  Seems comfortable.     Vital Signs .    Vitals:   12/18/22 2013 12/19/22 0000 12/19/22 0350 12/19/22 0756  BP: 139/75 (!) 160/100 (!) 160/92 (!) 167/109  Pulse: (!) 119 (!) 117 (!) 111 (!) 124  Resp: 17 (!) 21 20 19   Temp: 98.6 F (37 C) 98.9 F (37.2 C) 99 F (37.2 C) 97.8 F (36.6 C)  TempSrc: Axillary Axillary Oral Oral  SpO2: 98% 97% 98% 99%  Weight:      Height:        Intake/Output Summary (Last 24 hours) at 12/19/2022 1232 Last data filed at 12/19/2022 0601 Gross per 24 hour  Intake 2746.67 ml  Output 400 ml  Net 2346.67 ml      12/15/2022    6:41 PM 12/14/2022    3:57 PM 10/11/2022    2:37 PM  Last 3 Weights  Weight (lbs) 140 lb 128 lb 3.2 oz 134 lb 3.2 oz  Weight (kg) 63.504 kg 58.151 kg 60.873 kg      Telemetry/ECG    SR with freq PVCs and other times PAF - Personally Reviewed  Echo pending  Physical Exam .   GEN: No acute distress.   Neck: No JVD Cardiac: RRR, no murmurs, rubs, or gallops.  Respiratory: Clear to auscultation bilaterally. Ant position GI: Soft, nontender, non-distended  MS: No edema  Assessment & Plan .     Atrial fib wit RVR  --hx of PAF but not on anticoagulation with age, frailty and freq falls.  Has been seen by EP and not a candidate for Watchman.   --has been in and out  this admit  currently in SR --TSH 6.177 --Echo pending --Not an anticoagulation candidate given frequent falls and current subarachnoid hemorrhage. Therefore, will need to focus on rate.  --on metoprolol 25 BID per tube has not rec'd today but did receiver 5 mg IV lopressor ? Change to IV every 8 hours?    CAD History of NSTEMI in 2022 s/p DES to mid LAD. High-sensitivity troponin minimally elevated and flat on admission at 30 >> 32. - Resume Aspirin when okay with Neurosurgery. - Resume statin when able to take PO medications. - Troponin elevation not consistent with demand ischemia. No plans for ischemic evaluation.   Hypertension BP reasonably well controlled.  - Continue IV Lopressor as needed.   Otherwise, per primary team: - Traumatic subarachnoid hemorrhage - Acute metabolic encephalopathy - COPD/ asthma - Anemia - Acquired hypothyroidism - Hypokalemia  For questions or updates, please contact [facility] Please consult [email or web address] for contact info under        Signed, Nada Boozer, NP

## 2022-12-19 NOTE — Consult Note (Signed)
Urology Consult Note   Requesting Attending Physician:  Joycelyn Das, MD Service Providing Consult: Urology  Consulting Attending: Dr. Lafonda Mosses   Reason for Consult: Urinary retention  HPI: James Albright Sr. is seen in consultation for reasons noted above at the request of Joycelyn Das, MD  ------------------  Assessment:  89 y.o. male with AMS 2/2 traumatic subarachnoid hemorrhage, presently admitted to the neurosurgical service.  PMH significant for nonsustained V. tach, A-fib, ischemic cardiomyopathy, HFrEF, HTN, COPD, hypothyroidism, chronic anemia, and frequent falls.  Patient was nonresponsive on my arrival but his daughter reports that he was previously a patient of Dr. Retta Diones of [facility] and has had a remote history of brachytherapy.  Patient was groaning on examination and when bladder scan was >999 mL.  Multiple attempts were made for straight catheterization and ultimately a coud was placed.  Nurse who attempted multiple placements stated that he felt confident he had created a false passage.  Urology was consulted for urgent placement of complicated Foley catheter.  Please see separate procedure note   Recommendations: # Urinary retention # Prostate cancer # Difficult Foley placement # UTI  UC E. coli (+), sensitivities pending.  Agree with daily Rocephin or now. Over wire placement of 82f council catheter.  Do not remove until discussing with urology.  Around 2 L of urine returned.  Foley to stay in place for 5 to 7 days at a minimum for bladder rest following stretch injury Daily BMP.  Monitor for postobstructive diuresis  Case and plan discussed with Dr. Lafonda Mosses  Past Medical History: Past Medical History:  Diagnosis Date   Acquired hypothyroidism 08/15/2019   Acute systolic heart failure (HCC) 06/25/2020   Anemia 02/05/2020   boarderline anemic   Atopic dermatitis 12/05/2019   Bacterial pneumonia, unspecified 06/14/2012   06/14/2012 CXR w/ RML PNA  >Zithromax and Rocephin     Bradycardia 11/28/2018   CAD (coronary artery disease) 07/07/2020   Cancer (HCC)    prostate cancer-seed implant   Closed fracture dislocation of right elbow    Closed fracture of right olecranon process 04/10/2020   COPD (chronic obstructive pulmonary disease) (HCC) 01/21/2020   Dermatitis 02/05/2020   Dizziness 02/19/2016   Essential hypertension 11/28/2018   History of prostate cancer    seed implant   Hyperlipemia    Hyperlipidemia, mixed 08/21/2008   Qualifier: Diagnosis of  By: Maple Hudson MD, Clinton D    LVH (left ventricular hypertrophy) 11/28/2018   Malnutrition of moderate degree (HCC) 08/15/2019   Mixed conductive and sensorineural hearing loss, 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   Moderate persistent asthma, uncomplicated 01/21/2020   Nonsustained ventricular tachycardia (HCC) 01/30/2019   NSTEMI (non-ST elevated myocardial infarction) (HCC) 06/21/2020   Other fatigue 02/05/2020   PAF (paroxysmal atrial fibrillation) (HCC) 06/25/2020   Seasonal and perennial allergic rhinitis 05/17/2007       Senile osteoporosis 08/15/2019   SINUSITIS, ACUTE 06/28/2007   Qualifier: Diagnosis of  By: Clent Ridges NP, Tammy     Ventricular ectopy 11/28/2018    Past Surgical History:  Past Surgical History:  Procedure Laterality Date   APPENDECTOMY     CORONARY ATHERECTOMY N/A 06/24/2020   Procedure: CORONARY ATHERECTOMY;  Surgeon: Iran Ouch, MD;  Location: [facility];  Service: Cardiovascular;  Laterality: N/A;   CORONARY ULTRASOUND/IVUS N/A 06/24/2020   Procedure: Intravascular Ultrasound/IVUS;  Surgeon: Iran Ouch, MD;  Location: [facility];  Service: Cardiovascular;  Laterality: N/A;   LEFT HEART CATH AND CORONARY ANGIOGRAPHY  N/A 06/22/2020   Procedure: LEFT HEART CATH AND CORONARY ANGIOGRAPHY;  Surgeon: Yvonne Kendall, MD;  Location: [facility];  Service: Cardiovascular;  Laterality: N/A;   ORIF ELBOW FRACTURE Right 04/10/2020   Procedure: OPEN REDUCTION  INTERNAL FIXATION (ORIF) ELBOW/OLECRANON FRACTURE;  Surgeon: Teryl Lucy, MD;  Location: [facility];  Service: Orthopedics;  Laterality: Right;    Medication: Current Facility-Administered Medications  Medication Dose Route Frequency Provider Last Rate Last Admin   0.9 % NaCl with KCl 40 mEq / L  infusion   Intravenous Continuous Andris Baumann, MD 100 mL/hr at 12/19/22 0916 New Bag at 12/19/22 0916   acetaminophen (TYLENOL) tablet 650 mg  650 mg Oral Q4H PRN Iran Sizer, PA-C       Or   acetaminophen (TYLENOL) suppository 650 mg  650 mg Rectal Q4H PRN Cosentino, Allison R, PA-C       cefTRIAXone (ROCEPHIN) 1 g in sodium chloride 0.9 % 100 mL IVPB  1 g Intravenous Q24H Pokhrel, Laxman, MD       cyanocobalamin (VITAMIN B12) injection 1,000 mcg  1,000 mcg Intramuscular Once Pokhrel, Laxman, MD       docusate sodium (COLACE) capsule 100 mg  100 mg Oral BID Iran Sizer, PA-C   100 mg at 12/18/22 2052   famotidine (PEPCID) IVPB 20 mg premix  20 mg Intravenous Q12H Iran Sizer, PA-C 100 mL/hr at 12/19/22 0920 20 mg at 12/19/22 0920   feeding supplement (OSMOLITE 1.2 CAL) liquid 1,000 mL  1,000 mL Per Tube Continuous Pokhrel, Laxman, MD       heparin injection 5,000 Units  5,000 Units Subcutaneous Q12H Patrici Ranks [place], PA-C   5,000 Units at 12/19/22 1610   lidocaine (XYLOCAINE) 2 % jelly 1 Application  1 Application Urethral Once Pokhrel, Laxman, MD       metoprolol tartrate (LOPRESSOR) injection 5 mg  5 mg Intravenous Daily PRN Pokhrel, Laxman, MD   5 mg at 12/19/22 0921   metoprolol tartrate (LOPRESSOR) tablet 25 mg  25 mg Oral BID Pokhrel, Laxman, MD   25 mg at 12/18/22 2052   ondansetron (ZOFRAN) tablet 4 mg  4 mg Oral Q4H PRN Iran Sizer, PA-C       Or   ondansetron (ZOFRAN) injection 4 mg  4 mg Intravenous Q4H PRN Cosentino, Allison R, PA-C       polyethylene glycol (MIRALAX / GLYCOLAX) packet 17 g  17 g Oral Daily PRN Cosentino,  Allison R, PA-C       thiamine (VITAMIN B1) tablet 100 mg  100 mg Per Tube Daily Pokhrel, Laxman, MD        Allergies: Allergies  Allergen Reactions   Clarithromycin     REACTION: GI upset    Social History: Social History   Tobacco Use   Smoking status: Former    Current packs/day: 0.00    Types: Cigarettes    Quit date: 1960    Years since quitting: 64.6   Smokeless tobacco: Former    Types: Chew    Quit date: 2011  Vaping Use   Vaping status: Never Used  Substance Use Topics   Alcohol use: No   Drug use: No    Family History Family History  Problem Relation Age of Onset   Alzheimer's disease Father    Diabetes Mother     Review of Systems  Unable to perform ROS: Patient unresponsive     Objective   Vital signs  in last 24 hours: BP (!) 167/109 (BP Location: Right Arm)   Pulse (!) 124   Temp 97.8 F (36.6 C) (Oral)   Resp 19   Ht 6' (1.829 m)   Wt 63.5 kg   SpO2 99%   BMI 18.99 kg/m   Physical Exam General: Unresponsive, appears stated age, chronically ill HEENT: /AT Pulmonary: Normal work of breathing Cardiovascular: cyanosis Abdomen: Firm, distended bladder GU: Foley catheter in place draining clear tea colored urine Neuro: UTA, unresponsive  Most Recent Labs: Lab Results  Component Value Date   WBC 17.1 (H) 12/19/2022   HGB 11.4 (L) 12/19/2022   HCT 35.0 (L) 12/19/2022   PLT 230 12/19/2022    Lab Results  Component Value Date   NA 139 12/19/2022   K 4.0 12/19/2022   CL 110 12/19/2022   CO2 18 (L) 12/19/2022   BUN 16 12/19/2022   CREATININE 0.86 12/19/2022   CALCIUM 8.5 (L) 12/19/2022   MG 2.2 12/19/2022    Lab Results  Component Value Date   INR 1.1 12/17/2022     Urine Culture: @[id number] (laburin,org,[id number],[id number])@   IMAGING: ECHOCARDIOGRAM COMPLETE  Result Date: 12/19/2022    ECHOCARDIOGRAM REPORT   Patient Name:   James Barile Sr. Date of Exam: 12/19/2022 Medical Rec #:  [id number]             Height:        72.0 in Accession #:    [id number]            Weight:       140.0 lb Date of Birth:  March 31, 1932              BSA:          1.831 m Patient Age:    89 years              BP:           160/92 mmHg Patient Gender: M                     HR:           97 bpm. Exam Location:  Inpatient Procedure: 2D Echo, Cardiac Doppler and Color Doppler Indications:    Atrial Fibrillation I48.91  History:        Patient has prior history of Echocardiogram examinations, most                 recent 06/23/2020. CHF and Cardiomyopathy, CAD and Previous                 Myocardial Infarction, COPD, Arrythmias:PVC, Tachycardia and                 Atrial Fibrillation; Risk Factors:Hypertension.  Sonographer:    Lucendia Herrlich Referring Phys: [id number] POKHREL  Sonographer Comments: Image acquisition challenging due to patient behavioral factors. IMPRESSIONS  1. Left ventricular ejection fraction, by estimation, is 50 to 55%. The left ventricle has low normal function. The left ventricle has no regional wall motion abnormalities. Left ventricular diastolic parameters are indeterminate.  2. Right ventricular systolic function is normal. The right ventricular size is normal.  3. The mitral valve is normal in structure. Moderate mitral valve regurgitation. No evidence of mitral stenosis.  4. The aortic valve is normal in structure. Aortic valve regurgitation is not visualized. Mild aortic valve stenosis.  5. There is mild dilatation of the aortic root, measuring 37 mm.  6. The inferior vena cava  is normal in size with greater than 50% respiratory variability, suggesting right atrial pressure of 3 mmHg. FINDINGS  Left Ventricle: Left ventricular ejection fraction, by estimation, is 50 to 55%. The left ventricle has low normal function. The left ventricle has no regional wall motion abnormalities. The left ventricular internal cavity size was normal in size. There is no left ventricular hypertrophy. Left ventricular diastolic parameters are  indeterminate. Right Ventricle: The right ventricular size is normal. No increase in right ventricular wall thickness. Right ventricular systolic function is normal. Left Atrium: Left atrial size was normal in size. Right Atrium: Right atrial size was normal in size. Pericardium: There is no evidence of pericardial effusion. Mitral Valve: The mitral valve is normal in structure. Moderate mitral valve regurgitation. No evidence of mitral valve stenosis. Tricuspid Valve: The tricuspid valve is normal in structure. Tricuspid valve regurgitation is not demonstrated. No evidence of tricuspid stenosis. Aortic Valve: The aortic valve is normal in structure. Aortic valve regurgitation is not visualized. Aortic regurgitation PHT measures 740 msec. Mild aortic stenosis is present. Aortic valve mean gradient measures 9.0 mmHg. Aortic valve peak gradient measures 17.6 mmHg. Aortic valve area, by VTI measures 1.66 cm. Pulmonic Valve: The pulmonic valve was normal in structure. Pulmonic valve regurgitation is not visualized. No evidence of pulmonic stenosis. Aorta: There is mild dilatation of the aortic root, measuring 37 mm. Venous: The inferior vena cava is normal in size with greater than 50% respiratory variability, suggesting right atrial pressure of 3 mmHg. IAS/Shunts: No atrial level shunt detected by color flow Doppler.  LEFT VENTRICLE PLAX 2D LVIDd:         4.00 cm   Diastology LVIDs:         2.90 cm   LV e' medial:    12.40 cm/s LV PW:         0.70 cm   LV E/e' medial:  9.0 LV IVS:        1.00 cm   LV e' lateral:   13.50 cm/s LVOT diam:     2.00 cm   LV E/e' lateral: 8.2 LV SV:         52 LV SV Index:   28 LVOT Area:     3.14 cm  RIGHT VENTRICLE             IVC RV S prime:     20.50 cm/s  IVC diam: 1.35 cm TAPSE (M-mode): 1.8 cm LEFT ATRIUM             Index        RIGHT ATRIUM           Index LA diam:        4.00 cm 2.18 cm/m   RA Area:     16.50 cm LA Vol (A2C):   49.4 ml 26.98 ml/m  RA Volume:   40.00 ml  21.85  ml/m LA Vol (A4C):   52.5 ml 28.67 ml/m LA Biplane Vol: 51.2 ml 27.96 ml/m  AORTIC VALVE AV Area (Vmax):    1.49 cm AV Area (Vmean):   1.46 cm AV Area (VTI):     1.66 cm AV Vmax:           210.00 cm/s AV Vmean:          136.000 cm/s AV VTI:            0.311 m AV Peak Grad:      17.6 mmHg AV Mean Grad:  9.0 mmHg LVOT Vmax:         99.40 cm/s LVOT Vmean:        63.367 cm/s LVOT VTI:          0.165 m LVOT/AV VTI ratio: 0.53 AI PHT:            740 msec  AORTA Ao Root diam: 3.70 cm MITRAL VALVE                TRICUSPID VALVE MV Area (PHT): 5.31 cm     TR Peak grad:   36.7 mmHg MV Decel Time: 143 msec     TR Vmax:        303.00 cm/s MR Peak grad: 147.4 mmHg MR Vmax:      607.00 cm/s   SHUNTS MV E velocity: 111.00 cm/s  Systemic VTI:  0.16 m MV A velocity: 123.00 cm/s  Systemic Diam: 2.00 cm MV E/A ratio:  0.90 Kardie Tobb DO Electronically signed by Thomasene Ripple DO Signature Date/Time: 12/19/2022/1:37:51 PM    Final    DG Abd Portable 1V  Result Date: 12/19/2022 CLINICAL DATA:  Feeding tube placement EXAM: PORTABLE ABDOMEN - 1 VIEW COMPARISON:  Chest radiograph done on 12/17/2022 FINDINGS: Tip of feeding tube is seen in the region of the antrum of the stomach. Bowel gas pattern in the upper abdomen is unremarkable. Pelvis is not included in its entirety. Right side of the abdomen is not included in the image. Visualized lung fields are clear. Coronary artery stent is seen. IMPRESSION: Tip of feeding tube is seen in the region of the antrum of the stomach. Electronically Signed   By: Ernie Avena M.D.   On: 12/19/2022 11:05   DG CHEST PORT 1 VIEW  Result Date: 12/17/2022 CLINICAL DATA:  Fevers EXAM: PORTABLE CHEST 1 VIEW COMPARISON:  12/15/2022 FINDINGS: Cardiac shadow is stable. Aortic calcifications are noted. The lungs are well aerated bilaterally. No focal infiltrate or effusion is seen. No bony abnormality is noted. IMPRESSION: No acute abnormality seen. Electronically Signed   By: Alcide Clever  M.D.   On: 12/17/2022 22:15    ------  Elmon Kirschner, NP Pager: [phone]   Please contact the urology consult pager with any further questions/concerns.

## 2022-12-19 NOTE — Progress Notes (Signed)
Bladder scan showed that pt had >999 mL in his bladder. Pt had previously been voiding, but on exam was tender in bladder area. 3 RN's attempted in and out and 3 RN's attempted coude foley placement. Urology then consulted for difficult placement.   2L tea colored urine returned after successful foley placement.  Do not removed foley per Dr. Coralee Pesa for at least 5-7 days.  Robina Ade, RN

## 2022-12-19 NOTE — Consult Note (Signed)
Palliative Medicine Inpatient Consult Note  Consulting Provider: Joycelyn Das, MD   Reason for consult:   Palliative Care Consult Services Palliative Medicine Consult   Symptom Management Consult  Reason for Consult? brain hemorrhage, delirium, help with symptoms and goals of care   12/19/2022  HPI:  Per intake H&P --> James Albright Sr. is a 89 y.o. male with past medical history of Nonsustained V. tach, A-fib, ischemic cardiomyopathy (HFrEF with improved EF 35% improved to 55% 2022) HTN, COPD, hypothyroidism, chronic anemia, frequent falls. S/P traumatic subarachnoid hemorrhage. Palliative care has been asked to get involved to support additional goals of care conversations.   Clinical Assessment/Goals of Care:  *Please note that this is a verbal dictation therefore any spelling or grammatical errors are due to the "Dragon Medical One" system interpretation.  I have reviewed medical records including EPIC notes, labs and imaging, received report from bedside RN, assessed the patient.    I met with patients daughter, [name] to further discuss diagnosis prognosis, GOC, EOL wishes, disposition and options.   I introduced Palliative Medicine as specialized medical care for people living with serious illness. It focuses on providing relief from the symptoms and stress of a serious illness. The goal is to improve quality of life for both the patient and the family.  Medical History Review and Understanding:  A review of past medical history inclusive of atrial fibrillation, heart failure, hypertension, COPD, hypothyroidism, anemia and recurrent falls.  Social History:  Settle from [facility] originally.  He was 1 of 9 children.  He has been married twice though his present wife suffers from ailments in the setting of recurrent strokes.  [name].  [name] was in the [employer] and later worked in a Museum/gallery curator.  He is a man who enjoys  yard work, fishing, hunting, and his family.  He is a man of faith and practices within Christianity.  Functional and Nutritional State:  Preceding [name] he was fully functional of all B ADLs and IADLs.  Per his daughter he was still driving.  [name] is known to been a robust eater throughout his life despite his small size.  Advance Directives:  A detailed discussion was had today regarding advanced directives.  Advance directives have been completed and patient's daughter does agree to bring them in to scanned into the electronic medical record.  It is [name]  Code Status:  Concepts specific to code status, artifical feeding and hydration, continued IV antibiotics and rehospitalization was had.  The difference between a aggressive medical intervention path  and a palliative comfort care path for this patient at this time was had.   Encouraged patient/family to consider DNR/DNI status understanding evidenced based poor outcomes in similar hospitalized patient, as the cause of arrest is likely associated with advanced chronic/terminal illness rather than an easily reversible acute cardio-pulmonary event. I explained that DNR/DNI does not change the medical plan and it only comes into effect after a person has arrested (died).  It is a protective measure to keep [place] from harming the patient in their last moments of life.  Patients daughter, [name] expresses that she does not feel her father would desire full resuscitative efforts though needs time to consider this further given how quickly his decline occurred.  Discussion:  We discussed [name]' condition prior to his Gomez. [name] shares that Vale was doing fairly well and the circumstance of the Gomez which led to his subarachnoid hemorrhage  are not clear. She shares that each day has seemed encouraging with the exception of some incremental complications.   We reviewed [name] inability to take in sufficient nutrition and the need for a  coretrack. We discussed that this is a short term option and if he should neglect to improve further conversations regarding long term nutrition would be held if that is even consistent with [name]' personal goals.  We reviewed best case and worst case scenarios.   At this time [name] would like to allow time for outcomes. She if hopeful for improvements though understands the significance of her fathers illness.  Discussed the importance of continued conversation with family and their  medical providers regarding overall plan of care and treatment options, ensuring decisions are within the context of the patients values and GOCs.  Decision Maker: [name], (Daughter): [phone] (Mobile)   SUMMARY OF RECOMMENDATIONS   Full code --> Patients daughter, [name] will consider this further  Open and honest conversations held in the setting of [name]' recent Gomez and hemorrhage  Goals at this time are to allow time for outcomes  Ongoing PMT support  Code Status/Advance Care Planning: FULL CODE   Symptom Management:  Delirium: - Implement precautions  Palliative Prophylaxis:  Aspiration, Bowel Regimen, Delirium Protocol, Frequent Pain Assessment, Oral Care, Palliative Wound Care, and Turn Reposition  Additional Recommendations (Limitations, Scope, Preferences): Continue current measures  Psycho-social/Spiritual:  Desire for further Chaplaincy support: Not presently Additional Recommendations: Ongoing support   Prognosis: Unclear though high risk for further decline.  Discharge Planning: Not certain at this time.   Vitals:   12/19/22 0350 12/19/22 0756  BP: (!) 160/92 (!) 167/109  Pulse: (!) 111 (!) 124  Resp: 20 19  Temp: 99 F (37.2 C) 97.8 F (36.6 C)  SpO2: 98% 99%    Intake/Output Summary (Last 24 hours) at 12/19/2022 1226 Last data filed at 12/19/2022 0601 Gross per 24 hour  Intake 2746.67 ml  Output 400 ml  Net 2346.67 ml   Last Weight  Most recent  update: 12/15/2022  6:41 PM    Weight  63.5 kg (140 lb)            Gen:  Frail elderly Caucasian M in NAD HEENT: moist mucous membranes CV: Irregular rate and rhythm  PULM: ON 2LPM [place], breathing is even and non-labored ABD: soft/nontender  EXT: No edema  Neuro: Somenolent  PPS: 40%   This conversation/these recommendations were discussed with patient primary care team, Dr. Tyson Babinski  Billing based on MDM: High ______________________________________________________ [name] [place] Palliative Medicine Team Team Cell Phone: [phone] Please utilize secure chat with additional questions, if there is no response within 30 minutes please call the above phone number  Palliative Medicine Team providers are available by phone from 7am to 7pm daily and can be reached through the team cell phone.  Should this patient require assistance outside of these hours, please call the patient's attending physician.

## 2022-12-19 NOTE — Progress Notes (Signed)
Neurosurgery Service Progress Note  Subjective: Developed a low grade fever and went into A fib yesterday. Per his daughter his mentation was improved yesterday, he was awake and talking. But today he is lethargic and won't open his eyes.   Objective: Vitals:   12/18/22 2013 12/19/22 0000 12/19/22 0350 12/19/22 0756  BP: 139/75 (!) 160/100 (!) 160/92 (!) 167/109  Pulse: (!) 119 (!) 117 (!) 111 (!) 124  Resp: 17 (!) 21 20 19   Temp: 98.6 F (37 C) 98.9 F (37.2 C) 99 F (37.2 C) 97.8 F (36.6 C)  TempSrc: Axillary Axillary Oral Oral  SpO2: 98% 97% 98% 99%  Weight:      Height:        Physical Exam: Does not follow commands or open eyes to painful stimuli, however will withdraw and moan, Strength 5/5 x4 and SILTx4   Assessment & Plan: 89 y.o. s/p fall with AMS and scattered tSAH on CTH with waxing and waning neurologic exam.   -given his overall decline the hospitalist service will take over as the primary team -continue supportive care  -continue SQH for dvt ppx  -if AMS does not improve will order repeat [facility] tomorrow   Emilee Hero, PA-C 12/19/22 10:53 AM

## 2022-12-19 NOTE — Procedures (Signed)
Cortrak  Person Inserting Tube:  Mitchell, Makayla L, RD Tube Type:  Cortrak - 43 inches Tube Size:  10 Tube Location:  Left nare Secured by: Bridle Technique Used to Measure Tube Placement:  Marking at nare/corner of mouth Cortrak Secured At:  67 cm  Cortrak Tube Team Note:  Consult received to place a Cortrak feeding tube.   X-ray is required, abdominal x-ray has been ordered by the Cortrak team. Please confirm tube placement before using the Cortrak tube.   If the tube becomes dislodged please keep the tube and contact the Cortrak team at [email or web address] for replacement.  If after hours and replacement cannot be delayed, place a NG tube and confirm placement with an abdominal x-ray.    Makayla Mitchell RD, LDN Clinical Dietitian See AMiON for contact information.    

## 2022-12-19 NOTE — Procedures (Signed)
Urology was consulted to assist with difficult catheter placement with Mr. [name]. Jodi Marble, Sr., please see separate consult note.  Patient was previously followed by Dr. Retta Diones for brachytherapy/prostate cancer.  Patient was unresponsive but history and consent was obtained from his daughter, who was at bedside.  Patient was prepped and draped in the usual sterile fashion.  I inserted an angled zip wire into the urethra and after a few attempts was able to navigate the wire past the false passage and into the bladder.  I then injected 10 cc of sterile lubricant directly into the urethra.  A 94f council catheter was threaded over the wire and advanced into the bladder without resistance.  There was an immediate return of clear light tea colored urine.  Once positioning was assured, 10 cc of sterile water was injected into the Foley balloon.  The catheter was placed to drainage with no dependent loops, concluding the procedure.  Foley catheter to stay in place for at least 5 to 7 days for stretch injury.  Do not remove without consulting with urology.  Case and plan discussed with Dr.Machen    Elmon Kirschner, NP [facility] Pager: [phone]

## 2022-12-19 NOTE — Progress Notes (Signed)
PROGRESS NOTE    James Tham Sr.  UJW:[id number] DOB: 10-Feb-1932 DOA: 12/15/2022 PCP: Blane Ohara, MD    Brief Narrative:   James Kren Sr. is a 89 y.o. male with past medical history of Nonsustained V. tach, A-fib, ischemic cardiomyopathy (HFrEF with improved EF 35% improved to 55% 2022) HTN, COPD, hypothyroidism, chronic anemia, frequent falls currently admitted to the neurosurgical service for altered mental status secondary to traumatic subarachnoid hemorrhage, had  runs of A-fib and frequent PVCs on the monitor but was asymptomatic.  Patient was last hospitalized back in February 2022 with NSTEMI with cath showing severe single-vessel disease (heavily calcified mid LAD stenosis up to 90%) which was stented . Recommendation was made for aggressive secondary prevention. Patient was not anticoagulated despite CHA2DS2-VASc score of 4 due to anemia, frail state and frequent falls.  He did not receive a GI workup for his anemia due to frailty. Patient was febrile and hospitalist team was consulted for further management.  Assessment and plan.  Traumatic subarachnoid hemorrhage  History of frequent falls Acute metabolic encephalopathy Patient admitted on 12/15/22 to the neurosurgical service with traumatic subarachnoid hemorrhage, managed conservatively.  Still remains encephalopathic and more lethargic today.  Communicated with neurosurgery and plan is to repeat CT scan tomorrow if not improved.  Minimize narcotics and sedatives hypnotics.   Nutrition Status: Body mass index is 18.99 kg/m.  Nutrition Problem: Severe Malnutrition Etiology: chronic illness (COPD, HFrEF) Signs/Symptoms: severe fat depletion, severe muscle depletion Interventions: Tube feeding, Refer to RD note for recommendations Will consider for feeding tube placement so that he can get hydration nutrition and oral medication.    SIRS (systemic inflammatory response syndrome), possible UTI. Patient had fever with  tachycardia.  COVID and influenza RSV negative.  Procalcitonin 0.9.  Lactic acid at 1.8.  Urinalysis with negative leukocytes but shows more than 100,000 colonies of E. coli.  Will put the patient on IV Rocephin..  Chest x-ray without any acute findings.  Initial CBC with WBC at 20.7 and has trended down to 17.1 today..  On IV fluids due to unstable p.o. status.  Speech therapy has seen the patient and recommended NPO..  Folate level of 23.  Vitamin B12 at 220.  Will give 1 dose of vitamin B12 IM today.  Metabolic encephalopathy lethargic confusion.  Lack of oral safety.  Will put the patient on cortrak tube tube today.  Spoke with the patient's daughter at bedside regarding this.  Continue vitamin B12,  Suprapubic tenderness.  Likely acute urinary retention.  Bladder scan showed significant urinary retention.  History of prostate cancer.  Unable to do Foley catheter at bedside.  Will need to try coud catheter.  If fails might need urology evaluation.   PAF (paroxysmal atrial fibrillation) with RVR. Frequent PVCs with history of NSVT High bleeding risk due to history of falls   TSH slightly elevated.  Cardiology was consulted and plan to continue metoprolol but did not receive dose yesterday.  On metoprolol at home.Have put the patient back on IV metoprolol until patient has a cortrak tube tube.   2D echocardiogram from February 2022 with LV ejection fraction of 55 to 60%.  2D echocardiogram from 12/19/2022 pending at this time.      CAD S/P LAD stent 2022 HFrEF with improved EF(35>55%, 2022)/ischemic cardiomyopathy Elevated troponin but flat.  Continue metoprolol, Lipitor.Will defer continuation of aspirin to neurosurgery.  2D echocardiogram.  Follow cardiology recommendation.   Hypokalemia On KCl with IV fluids.  Potassium  today at 4.0.   Anemia Iron level of 10.  Latest hemoglobin 11.4.  Transfuse as necessary.  Moderate persistent asthma, uncomplicated Continue DuoNebs.   Acquired  hypothyroidism TSH of 6.1. Continue levothyroxine 175 daily   Essential hypertension  blood pressure is elevated.  Will need metoprolol on board.  Debility, deconditioning, frailty.  PT evaluation.    Goals of care.  Patient with advanced age, encephalopathy frailty severe protein calorie malnutrition and intracranial bleed with possible infection.  Discussed goals of care with the patient's daughter at bedside.Marland Kitchen She wishes to continue current level of care.  She is open to the idea of palliative care and ongoing assistance for goals of care, symptom management.  Will consult palliative care.  Prognosis overall guarded    DVT prophylaxis: heparin injection 5,000 Units Start: 12/18/22 1000 SCDs Start: 12/15/22 2202   Code Status:     Code Status: Full Code  Disposition: Uncertain at this time.  Status is: Inpatient  Remains inpatient appropriate because: Encephalopathy, traumatic intracranial hemorrhage, SIRS, pending clinical improvement, urinary retention.   Family Communication: Spoke with the patient's daughter at bedside at length again today  Procedures:  Foley catheter placement  Antimicrobials:  Start Rocephin IV  Consults  palliative care Cardiology Neurosurgery  Anti-infectives (From admission, onward)    None      Subjective: Today, patient was seen and examined at bedside.  Daughter at bedside.  Patient appears to be more lethargic today than yesterday.  Heart rate was uncontrolled.  Was unable to tolerate p.o.  Nursing staff reported that bladder scan had significant urinary retention.  Objective: Vitals:   12/18/22 2013 12/19/22 0000 12/19/22 0350 12/19/22 0756  BP: 139/75 (!) 160/100 (!) 160/92 (!) 167/109  Pulse: (!) 119 (!) 117 (!) 111 (!) 124  Resp: 17 (!) 21 20 19   Temp: 98.6 F (37 C) 98.9 F (37.2 C) 99 F (37.2 C) 97.8 F (36.6 C)  TempSrc: Axillary Axillary Oral Oral  SpO2: 98% 97% 98% 99%  Weight:      Height:        Intake/Output  Summary (Last 24 hours) at 12/19/2022 1313 Last data filed at 12/19/2022 0601 Gross per 24 hour  Intake 2746.67 ml  Output 400 ml  Net 2346.67 ml   Filed Weights   12/15/22 1841  Weight: 63.5 kg    Physical Examination: Body mass index is 18.99 kg/m.   General:   Somnolent thinly built frail appearing elderly male.  On restraints.  HENT:   No scleral pallor or icterus noted.   Laceration over the right supraorbital region. Chest:   Diminished breath sounds bilaterally.  CVS: S1 &S2 heard. No murmur.  Regular rate and rhythm. Abdomen: Soft, nontender, nondistended.  Bowel sounds are heard.   Extremities: No cyanosis, clubbing or edema.   Psych: Somnolent,, CNS: Moving extremities, generalized weakness noted Skin: Warm and dry.  No rashes noted.  Data Reviewed:   CBC: Recent Labs  Lab 12/15/22 1840 12/15/22 1849 12/17/22 2025 12/18/22 0945 12/19/22 0115  WBC 10.1  --  20.7* 16.3* 17.1*  HGB 11.6* 12.6* 12.9* 12.3* 11.4*  HCT 35.9* 37.0* 38.5* 37.6* 35.0*  MCV 99.4  --  97.0 98.2 95.6  PLT 197  --  230 204 230    Basic Metabolic Panel: Recent Labs  Lab 12/15/22 1840 12/15/22 1849 12/17/22 2025 12/18/22 0945 12/19/22 0115  NA 134* 134* 135 139 139  K 3.7 3.6 3.2* 3.6 4.0  CL 99 99 101  104 110  CO2 23  --  21* 22 18*  GLUCOSE 118* 114* 139* 130* 133*  BUN 12 12 14 15 16   CREATININE 0.86 0.80 0.80 0.78 0.86  CALCIUM 9.0  --  8.9 8.7* 8.5*  MG  --   --  2.1 2.2 2.2    Liver Function Tests: Recent Labs  Lab 12/15/22 1840 12/17/22 2025  AST 25 20  ALT 21 18  ALKPHOS 49 49  BILITOT 0.3 0.7  PROT 6.5 6.8  ALBUMIN 3.4* 3.0*     Radiology Studies: DG Abd Portable 1V  Result Date: 12/19/2022 CLINICAL DATA:  Feeding tube placement EXAM: PORTABLE ABDOMEN - 1 VIEW COMPARISON:  Chest radiograph done on 12/17/2022 FINDINGS: Tip of feeding tube is seen in the region of the antrum of the stomach. Bowel gas pattern in the upper abdomen is unremarkable. Pelvis is  not included in its entirety. Right side of the abdomen is not included in the image. Visualized lung fields are clear. Coronary artery stent is seen. IMPRESSION: Tip of feeding tube is seen in the region of the antrum of the stomach. Electronically Signed   By: Ernie Avena M.D.   On: 12/19/2022 11:05   DG CHEST PORT 1 VIEW  Result Date: 12/17/2022 CLINICAL DATA:  Fevers EXAM: PORTABLE CHEST 1 VIEW COMPARISON:  12/15/2022 FINDINGS: Cardiac shadow is stable. Aortic calcifications are noted. The lungs are well aerated bilaterally. No focal infiltrate or effusion is seen. No bony abnormality is noted. IMPRESSION: No acute abnormality seen. Electronically Signed   By: Alcide Clever M.D.   On: 12/17/2022 22:15      LOS: 3 days     Joycelyn Das, MD [facility] Available via Epic secure chat 7am-7pm After these hours, please refer to coverage provider listed on [email or web address] 12/19/2022, 1:13 PM

## 2022-12-20 ENCOUNTER — Inpatient Hospital Stay (HOSPITAL_COMMUNITY): Payer: Medicare HMO

## 2022-12-20 DIAGNOSIS — I5022 Chronic systolic (congestive) heart failure: Secondary | ICD-10-CM | POA: Diagnosis not present

## 2022-12-20 DIAGNOSIS — S066XAA Traumatic subarachnoid hemorrhage with loss of consciousness status unknown, initial encounter: Secondary | ICD-10-CM | POA: Diagnosis not present

## 2022-12-20 DIAGNOSIS — Z7189 Other specified counseling: Secondary | ICD-10-CM | POA: Diagnosis not present

## 2022-12-20 DIAGNOSIS — R296 Repeated falls: Secondary | ICD-10-CM | POA: Diagnosis not present

## 2022-12-20 DIAGNOSIS — Z515 Encounter for palliative care: Secondary | ICD-10-CM | POA: Diagnosis not present

## 2022-12-20 DIAGNOSIS — G9341 Metabolic encephalopathy: Secondary | ICD-10-CM | POA: Diagnosis not present

## 2022-12-20 LAB — URINE CULTURE: Culture: >=100000 — AB

## 2022-12-20 LAB — GLUCOSE, CAPILLARY
Glucose-Capillary: 112 mg/dL — ABNORMAL HIGH (ref 70–99)
Glucose-Capillary: 115 mg/dL — ABNORMAL HIGH (ref 70–99)
Glucose-Capillary: 122 mg/dL — ABNORMAL HIGH (ref 70–99)
Glucose-Capillary: 97 mg/dL (ref 70–99)
Glucose-Capillary: 125 mg/dL — ABNORMAL HIGH (ref 70–99)

## 2022-12-20 LAB — PHOSPHORUS: Phosphorus: 2.6 mg/dL (ref 2.5–4.6)

## 2022-12-20 LAB — MAGNESIUM: Magnesium: 2 mg/dL (ref 1.7–2.4)

## 2022-12-20 MED ORDER — CYANOCOBALAMIN 1000 MCG/ML IJ SOLN
1000.0000 ug | Freq: Once | INTRAMUSCULAR | Status: AC
  Administered 2022-12-20: 1000 ug via INTRAMUSCULAR
  Filled 2022-12-20: qty 1

## 2022-12-20 MED ORDER — LEVOTHYROXINE SODIUM 75 MCG PO TABS
175.0000 ug | ORAL_TABLET | Freq: Every day | ORAL | Status: DC
  Administered 2022-12-20 – 2023-01-19 (×28): 175 ug
  Filled 2022-12-20 (×29): qty 1

## 2022-12-20 MED ORDER — TERBINAFINE HCL 250 MG PO TABS
250.0000 mg | ORAL_TABLET | Freq: Every day | ORAL | Status: DC
  Administered 2022-12-20 – 2023-01-14 (×23): 250 mg
  Filled 2022-12-20 (×27): qty 1

## 2022-12-20 MED ORDER — ORAL CARE MOUTH RINSE
15.0000 mL | OROMUCOSAL | Status: DC
  Administered 2022-12-20 – 2023-01-19 (×116): 15 mL via OROMUCOSAL

## 2022-12-20 MED ORDER — ORAL CARE MOUTH RINSE: 15.0000 mL | OROMUCOSAL | Status: DC | PRN

## 2022-12-20 MED ORDER — FAMOTIDINE 40 MG/5ML PO SUSR
40.0000 mg | Freq: Every day | ORAL | Status: DC
  Administered 2022-12-20 – 2023-01-13 (×22): 40 mg via ORAL
  Filled 2022-12-20 (×26): qty 5

## 2022-12-20 MED ORDER — CHLORHEXIDINE GLUCONATE CLOTH 2 % EX PADS
6.0000 | MEDICATED_PAD | Freq: Every day | CUTANEOUS | Status: DC
  Administered 2022-12-20 – 2023-01-19 (×31): 6 via TOPICAL

## 2022-12-20 MED ORDER — ALBUTEROL SULFATE HFA 108 (90 BASE) MCG/ACT IN AERS: 2.0000 | INHALATION_SPRAY | Freq: Four times a day (QID) | RESPIRATORY_TRACT | Status: DC | PRN

## 2022-12-20 MED ORDER — ALBUTEROL SULFATE (2.5 MG/3ML) 0.083% IN NEBU: 2.5000 mg | INHALATION_SOLUTION | Freq: Four times a day (QID) | RESPIRATORY_TRACT | Status: DC | PRN

## 2022-12-20 MED ORDER — POTASSIUM PHOSPHATES 15 MMOLE/5ML IV SOLN
20.0000 mmol | Freq: Once | INTRAVENOUS | Status: AC
  Administered 2022-12-20: 20 mmol via INTRAVENOUS
  Filled 2022-12-20: qty 6.67

## 2022-12-20 NOTE — Progress Notes (Signed)
Speech Language Pathology Treatment: Dysphagia  Patient Details Name: [name]. MRN: [id number] DOB: Sep 26, 1931 Today's Date: 12/20/2022 Time: 2536-6440 SLP Time Calculation (min) (ACUTE ONLY): 14 min  Assessment / Plan / Recommendation Clinical Impression  Pt did not rouse to any verbal or noxious stimuli. SLP positioned pt fully upright and performed thorough oral care, with pt keeping his eyes closed and verbalizing audibly x1 throughout the session. Removed thick dried skin and secretions from lips and lingual surface. Pt frequently bit down on [place], intermittently responding to cues to open his mouth. He does not currently demonstrate a level of alertness adequate for POs. Continue with current plan of care. Recommend NPO with aggressive oral care. Will continue to follow as mentation improves.     HPI HPI: Pt is an 89 y.o. male who presented with AMS after a fall. CT head: Scattered areas of subarachnoid hemorrhage in the left occipital  lobe, bilateral high frontal lobes, and right temporal lobe. Small amount of subarachnoid hemorrhage posterior to the right midbrain extending into the cerebral aqueduct. PMH: COPD, prior NSTEMI / CHF, NSVT, PAF, on ASA81.      SLP Plan  Continue with current plan of care      Recommendations for follow up therapy are one component of a multi-disciplinary discharge planning process, led by the attending physician.  Recommendations may be updated based on patient status, additional functional criteria and insurance authorization.    Recommendations  Diet recommendations: NPO Medication Administration: Via alternative means                  Oral care QID;Staff/trained caregiver to provide oral care   Frequent or constant Supervision/Assistance Dysphagia, unspecified (R13.10)     Continue with current plan of care     Gwynneth Aliment, M.A., CF-SLP Speech Language Pathology, Acute Rehabilitation Services  Secure Chat  preferred [phone]   12/20/2022, 2:32 PM

## 2022-12-20 NOTE — Progress Notes (Signed)
PROGRESS NOTE    Costantino Rossberg Sr.  NGE:[id number] DOB: Sep 13, 1931 DOA: 12/15/2022 PCP: Blane Ohara, MD    Brief Narrative:   Dhillon Lamie Sr. is a 89 y.o. male with past medical history of Nonsustained V. tach, A-fib, ischemic cardiomyopathy (HFrEF with improved EF 35% improved to 55% 2022) HTN, COPD, hypothyroidism, chronic anemia, frequent falls currently admitted to the neurosurgical service for altered mental status secondary to traumatic subarachnoid hemorrhage, had  runs of A-fib and frequent PVCs on the monitor but was asymptomatic.  Patient was last hospitalized back in February 2022 with NSTEMI with cath showing severe single-vessel disease (heavily calcified mid LAD stenosis up to 90%) which was stented . Recommendation was made for aggressive secondary prevention. Patient was not anticoagulated despite CHA2DS2-VASc score of 4 due to anemia, frail state and frequent falls.  He did not receive a GI workup for his anemia due to frailty. Patient was febrile and hospitalist team was consulted for further management.  During hospitalization, patient continued to remain encephalopathic and was unsafe for p.o. intake show cortrak tube tube was placed in.  He also had significant urinary retention and urology was consulted for coud catheter placement.  Assessment and plan.  Traumatic subarachnoid hemorrhage  History of frequent falls Acute metabolic encephalopathy Patient admitted on 12/15/22 to the neurosurgical service with traumatic subarachnoid hemorrhage, managed conservatively.  Still remains encephalopathic.  Plan for CT scan of the head today as per neurosurgery.  Will continue to minimize narcotics and sedatives hypnotics and address delirium.  Required Foley catheter placement by urology for acute urinary retention..   Nutrition Status: Failure to thrive, poor oral intake, unsafe oral intake Body mass index is 17.55 kg/m.  Nutrition Problem: Severe Malnutrition Etiology:  chronic illness (COPD, HFrEF) Signs/Symptoms: severe fat depletion, severe muscle depletion Interventions: Tube feeding, Refer to RD note for recommendations Status post cortrak tube tube placement for hydration and nutrition.  Still encephalopathic.   E. coli UTI. Patient had fever with tachycardia on presentation with urine culture showing E. coli.  COVID and influenza RSV negative.  Procalcitonin 0.9.  Lactic acid at 1.8.  Urinalysis with negative leukocytes but shows more than 100,000 colonies of E. coli.  on IV Rocephin, sensitivity pending.  Chest x-ray without any acute findings.  Initial CBC with WBC at 20.7 and has trended down to 13.2 today.  Speech therapy has seen the patient and recommended NPO  Folate level of 23.  Vitamin B12 at 220.  Received 1 dose of IM vitamin B12 yesterday.  Continue IM vitamin B-12 again today.  Metabolic encephalopathy, lethargy, confusion.  Lack of oral safety.  On  the patient on cortrak tube tube today.  Spoke with the patient's daughter at bedside regarding this.  Continue vitamin B12, patient has been seen by palliative care and at this time plan is to proceed with current level of care.  Acute urinary retention.  Bladder scan showed significant urinary retention.  History of prostate cancer.  Nursing staff was unable to do Foley catheter so urology was consulted and received a Foley catheter.  Urology recommends putting the Foley catheter for 5 to 7 days and will need to check with urology prior to discontinuation.   PAF (paroxysmal atrial fibrillation) with RVR. Frequent PVCs with history of NSVT High bleeding risk due to history of falls  Cardiology on board.  Continue metoprolol 25 mg twice daily with 5 mg IV as needed.   2D echocardiogram from 12/19/2022 with LV ejection  fraction of 50 to 55%.  Not a good candidate for Watchman device.  TSH was 6.1.   CAD S/P LAD stent 2022 HFrEF with improved EF(35>55%, 2022)/ischemic cardiomyopathy Elevated  troponin but flat.  Continue metoprolol, Lipitor. Will defer continuation of aspirin to neurosurgery.  2 D echocardiogram with preserved LV function.  Follow cardiology recommendation.   Hypokalemia On KCl with IV fluids.  Potassium today at 3.9.  Improved  Hypophosphatemia.  Will replenish with K-Phos today.  Check levels in AM.   Anemia Iron level of 10.  Latest hemoglobin 11.4.  Transfuse as necessary.  Moderate persistent asthma, uncomplicated Continue DuoNebs.   Acquired hypothyroidism TSH of 6.1. Continue levothyroxine 175 mcg daily   Essential hypertension Continue metoprolol.  Debility, deconditioning, frailty.  PT evaluation when patient is more stable.  Goals of care.  Patient with guarded prognosis.  Palliative care on board.  So far continuing current level of care at this time.   DVT prophylaxis: heparin injection 5,000 Units Start: 12/18/22 1000 SCDs Start: 12/15/22 2202   Code Status:     Code Status: Full Code  Disposition: Uncertain at this time.  Pending clinical improvement.  Status is: Inpatient  Remains inpatient appropriate because: Persistent encephalopathy, traumatic intracranial hemorrhage, E Coli UTI, pending clinical improvement, urinary retention.   Family Communication:  Spoke with the patient's daughter at bedside on 12/19/2022  Procedures:  Foley catheter placement Cortrak tube tube placement.  Antimicrobials:   Rocephin IV  Consults   palliative care Cardiology Neurosurgery Urology  Anti-infectives (From admission, onward)    Start     Dose/Rate Route Frequency Ordered Stop   12/20/22 1130  terbinafine (LAMISIL) tablet 250 mg        250 mg Per Tube Daily 12/20/22 1039     12/19/22 1415  cefTRIAXone (ROCEPHIN) 1 g in sodium chloride 0.9 % 100 mL IVPB        1 g 200 mL/hr over 30 Minutes Intravenous Every 24 hours 12/19/22 1324        Subjective: Today, patient was seen and examined at bedside.  Still appears to be  lethargic.  Status post cortrak tube and Foley catheter placement.  Objective: Vitals:   12/20/22 0305 12/20/22 0735 12/20/22 1123 12/20/22 1233  BP: 108/63 111/84 123/76   Pulse: 96 92 99 65  Resp: (!) 24 (!) 23 (!) 21 19  Temp: 98.6 F (37 C) 98 F (36.7 C) 98.2 F (36.8 C)   TempSrc: Rectal Oral Oral   SpO2: 98% 95% 97% 97%  Weight: 58.7 kg     Height:        Intake/Output Summary (Last 24 hours) at 12/20/2022 1437 Last data filed at 12/20/2022 1429 Gross per 24 hour  Intake 3688.96 ml  Output 3325 ml  Net 363.96 ml   Filed Weights   12/15/22 1841 12/20/22 0305  Weight: 63.5 kg 58.7 kg    Physical Examination: Body mass index is 17.55 kg/m.   General:   Thinly built, somnolent, frail appearing male, on restraints.   HENT:   No scleral pallor or icterus noted.   Laceration over the right supraorbital region. Chest:   Diminished breath sounds bilaterally.  CVS: S1 &S2 heard. No murmur.  Regular rate and rhythm. Abdomen: Soft, nontender, nondistended.  Bowel sounds are heard.   Extremities: No cyanosis, clubbing or edema.   Psych: Somnolent, frail appearing,  CNS: Moving extremities, generalized weakness  Skin: Warm and dry.  No rashes noted.  Data Reviewed:  CBC: Recent Labs  Lab 12/15/22 1840 12/15/22 1849 12/17/22 2025 12/18/22 0945 12/19/22 0115 12/20/22 0617  WBC 10.1  --  20.7* 16.3* 17.1* 13.2*  HGB 11.6* 12.6* 12.9* 12.3* 11.4* 9.6*  HCT 35.9* 37.0* 38.5* 37.6* 35.0* 30.2*  MCV 99.4  --  97.0 98.2 95.6 99.3  PLT 197  --  230 204 230 186    Basic Metabolic Panel: Recent Labs  Lab 12/15/22 1840 12/15/22 1849 12/17/22 2025 12/18/22 0945 12/19/22 0115 12/19/22 1729 12/20/22 0617  NA 134* 134* 135 139 139  --  143  K 3.7 3.6 3.2* 3.6 4.0  --  3.9  CL 99 99 101 104 110  --  116*  CO2 23  --  21* 22 18*  --  21*  GLUCOSE 118* 114* 139* 130* 133*  --  110*  BUN 12 12 14 15 16   --  18  CREATININE 0.86 0.80 0.80 0.78 0.86  --  0.79  CALCIUM  9.0  --  8.9 8.7* 8.5*  --  8.0*  MG  --   --  2.1 2.2 2.2 2.3 2.2  PHOS  --   --   --   --   --  2.0* 1.4*    Liver Function Tests: Recent Labs  Lab 12/15/22 1840 12/17/22 2025  AST 25 20  ALT 21 18  ALKPHOS 49 49  BILITOT 0.3 0.7  PROT 6.5 6.8  ALBUMIN 3.4* 3.0*     Radiology Studies: ECHOCARDIOGRAM COMPLETE  Result Date: 12/19/2022    ECHOCARDIOGRAM REPORT   Patient Name:   Sammy Tannen Sr. Date of Exam: 12/19/2022 Medical Rec #:  [id number]             Height:       72.0 in Accession #:    [id number]            Weight:       140.0 lb Date of Birth:  [birth year]              BSA:          1.831 m Patient Age:    89 years              BP:           160/92 mmHg Patient Gender: M                     HR:           97 bpm. Exam Location:  Inpatient Procedure: 2D Echo, Cardiac Doppler and Color Doppler Indications:    Atrial Fibrillation I48.91  History:        Patient has prior history of Echocardiogram examinations, most                 recent 06/23/2020. CHF and Cardiomyopathy, CAD and Previous                 Myocardial Infarction, COPD, Arrythmias:PVC, Tachycardia and                 Atrial Fibrillation; Risk Factors:Hypertension.  Sonographer:    Lucendia Herrlich Referring Phys: [id number]   Sonographer Comments: Image acquisition challenging due to patient behavioral factors. IMPRESSIONS  1. Left ventricular ejection fraction, by estimation, is 50 to 55%. The left ventricle has low normal function. The left ventricle has no regional wall motion abnormalities. Left ventricular diastolic parameters are indeterminate.  2. Right ventricular systolic function is normal.  The right ventricular size is normal.  3. The mitral valve is normal in structure. Moderate mitral valve regurgitation. No evidence of mitral stenosis.  4. The aortic valve is normal in structure. Aortic valve regurgitation is not visualized. Mild aortic valve stenosis.  5. There is mild dilatation of the aortic root,  measuring 37 mm.  6. The inferior vena cava is normal in size with greater than 50% respiratory variability, suggesting right atrial pressure of 3 mmHg. FINDINGS  Left Ventricle: Left ventricular ejection fraction, by estimation, is 50 to 55%. The left ventricle has low normal function. The left ventricle has no regional wall motion abnormalities. The left ventricular internal cavity size was normal in size. There is no left ventricular hypertrophy. Left ventricular diastolic parameters are indeterminate. Right Ventricle: The right ventricular size is normal. No increase in right ventricular wall thickness. Right ventricular systolic function is normal. Left Atrium: Left atrial size was normal in size. Right Atrium: Right atrial size was normal in size. Pericardium: There is no evidence of pericardial effusion. Mitral Valve: The mitral valve is normal in structure. Moderate mitral valve regurgitation. No evidence of mitral valve stenosis. Tricuspid Valve: The tricuspid valve is normal in structure. Tricuspid valve regurgitation is not demonstrated. No evidence of tricuspid stenosis. Aortic Valve: The aortic valve is normal in structure. Aortic valve regurgitation is not visualized. Aortic regurgitation PHT measures 740 msec. Mild aortic stenosis is present. Aortic valve mean gradient measures 9.0 mmHg. Aortic valve peak gradient measures 17.6 mmHg. Aortic valve area, by VTI measures 1.66 cm. Pulmonic Valve: The pulmonic valve was normal in structure. Pulmonic valve regurgitation is not visualized. No evidence of pulmonic stenosis. Aorta: There is mild dilatation of the aortic root, measuring 37 mm. Venous: The inferior vena cava is normal in size with greater than 50% respiratory variability, suggesting right atrial pressure of 3 mmHg. IAS/Shunts: No atrial level shunt detected by color flow Doppler.  LEFT VENTRICLE PLAX 2D LVIDd:         4.00 cm   Diastology LVIDs:         2.90 cm   LV e' medial:    12.40 cm/s LV  PW:         0.70 cm   LV E/e' medial:  9.0 LV IVS:        1.00 cm   LV e' lateral:   13.50 cm/s LVOT diam:     2.00 cm   LV E/e' lateral: 8.2 LV SV:         52 LV SV Index:   28 LVOT Area:     3.14 cm  RIGHT VENTRICLE             IVC RV S prime:     20.50 cm/s  IVC diam: 1.35 cm TAPSE (M-mode): 1.8 cm LEFT ATRIUM             Index        RIGHT ATRIUM           Index LA diam:        4.00 cm 2.18 cm/m   RA Area:     16.50 cm LA Vol (A2C):   49.4 ml 26.98 ml/m  RA Volume:   40.00 ml  21.85 ml/m LA Vol (A4C):   52.5 ml 28.67 ml/m LA Biplane Vol: 51.2 ml 27.96 ml/m  AORTIC VALVE AV Area (Vmax):    1.49 cm AV Area (Vmean):   1.46 cm AV Area (VTI):  1.66 cm AV Vmax:           210.00 cm/s AV Vmean:          136.000 cm/s AV VTI:            0.311 m AV Peak Grad:      17.6 mmHg AV Mean Grad:      9.0 mmHg LVOT Vmax:         99.40 cm/s LVOT Vmean:        63.367 cm/s LVOT VTI:          0.165 m LVOT/AV VTI ratio: 0.53 AI PHT:            740 msec  AORTA Ao Root diam: 3.70 cm MITRAL VALVE                TRICUSPID VALVE MV Area (PHT): 5.31 cm     TR Peak grad:   36.7 mmHg MV Decel Time: 143 msec     TR Vmax:        303.00 cm/s MR Peak grad: 147.4 mmHg MR Vmax:      607.00 cm/s   SHUNTS MV E velocity: 111.00 cm/s  Systemic VTI:  0.16 m MV A velocity: 123.00 cm/s  Systemic Diam: 2.00 cm MV E/A ratio:  0.90 Kardie Tobb DO Electronically signed by Thomasene Ripple DO Signature Date/Time: 12/19/2022/1:37:51 PM    Final    DG Abd Portable 1V  Result Date: 12/19/2022 CLINICAL DATA:  Feeding tube placement EXAM: PORTABLE ABDOMEN - 1 VIEW COMPARISON:  Chest radiograph done on 12/17/2022 FINDINGS: Tip of feeding tube is seen in the region of the antrum of the stomach. Bowel gas pattern in the upper abdomen is unremarkable. Pelvis is not included in its entirety. Right side of the abdomen is not included in the image. Visualized lung fields are clear. Coronary artery stent is seen. IMPRESSION: Tip of feeding tube is seen in the  region of the antrum of the stomach. Electronically Signed   By: Ernie Avena M.D.   On: 12/19/2022 11:05      LOS: 4 days    Joycelyn Das, MD [facility] Available via Epic secure chat 7am-7pm After these hours, please refer to coverage provider listed on [email or web address] 12/20/2022, 2:37 PM

## 2022-12-20 NOTE — Progress Notes (Signed)
Neurosurgery Service Progress Note  Subjective: No acute events overnight. Not as lethargic as yesterday, but still will not follow commands or open eyes. Minimal improvement compared to yesterday.    Objective: Vitals:   12/19/22 1940 12/19/22 2315 12/20/22 0305 12/20/22 0735  BP: 124/67 114/77 108/63 111/84  Pulse: 92 98 96 92  Resp: (!) 22 (!) 22 (!) 24 (!) 23  Temp: 98.5 F (36.9 C) 99 F (37.2 C) 98.6 F (37 C) 98 F (36.7 C)  TempSrc: Oral Axillary Rectal Oral  SpO2: 98% 95% 98% 95%  Weight:   58.7 kg   Height:        Physical Exam: Does not follow commands or open eyes to painful stimuli, however will withdraw and moan, indistinct mumbling, Strength 5/5 x4   Assessment & Plan: 89 y.o. s/p fall with AMS and scattered tSAH on CTH with waxing and waning neurologic exam. Minimal improvement compare to yesterday.   -ordered repeat CTH  -continue supportive care  -continue SQH for dvt ppx   Emilee Hero, PA-C 12/20/22 7:59 AM

## 2022-12-20 NOTE — Progress Notes (Signed)
MEWS Progress Note  Patient Details Name: James Fogleman Sr. MRN: [id number] DOB: July 07, 1931 Today's Date: 12/20/2022   MEWS Flowsheet Documentation:  Assess: MEWS Score Temp: 98.2 F (36.8 C) BP: 123/76 MAP (mmHg): 91 Pulse Rate: 99 ECG Heart Rate: 80 Resp: (!) 21 Level of Consciousness: New agitation confusion SpO2: 97 % O2 Device: Room Air Patient Activity (if Appropriate): In bed O2 Flow Rate (L/min): 2 L/min Assess: MEWS Score MEWS Temp: 0 MEWS Systolic: 0 MEWS Pulse: 0 MEWS RR: 1 MEWS LOC: 1 MEWS Score: 2 MEWS Score Color: Yellow Assess: SIRS CRITERIA SIRS Temperature : 0 SIRS Respirations : 1 SIRS Pulse: 0 SIRS WBC: 1 SIRS Score Sum : 2 Assess: if the MEWS score is Yellow or Red Were vital signs accurate and taken at a resting state?: Yes (Pt is having agitation/restlessness, so unable to take at resting state.) Does the patient meet 2 or more of the SIRS criteria?: Yes Does the patient have a confirmed or suspected source of infection?: Yes MEWS guidelines implemented : No, previously yellow, continue vital signs every 4 hours        Kavin Leech A 12/20/2022, 12:32 PM

## 2022-12-20 NOTE — Progress Notes (Signed)
Palliative Medicine Inpatient Follow Up Note HPI: James Dillahunt Sr. is a 89 y.o. male with past medical history of Nonsustained V. tach, A-fib, ischemic cardiomyopathy (HFrEF with improved EF 35% improved to 55% 2022) HTN, COPD, hypothyroidism, chronic anemia, frequent falls. S/P traumatic subarachnoid hemorrhage. Palliative care has been asked to get involved to support additional goals of care conversations.   Today's Discussion 12/20/2022  *Please note that this is a verbal dictation therefore any spelling or grammatical errors are due to the "[employer]agon [employer] One" system interpretation.  Chart reviewed inclusive of vital signs, progress notes, laboratory results, and diagnostic images.   I met at bedside this morning with [name] and his daughter, [name]. [name] was disoriented and not able to follow directions meaningfully. Per his daughter he had been initiated on TF yesterday which she is hoping will give him more strength.   We discussed patients delirium at this time. [name] shares that [name] was similar after his heart attack. He seems to be prone to hospitalization related delirium.   Created space and opportunity for [name]  to explore thoughts feelings and fears regarding  [name]' current medical situation. We discussed that [name] still remains hopeful for improvements. She talks about the difficulties associated with making decisions as her father can talk and move unlike her brother who passed roughly one year ago who was intubated and had herniation of his brain. I acknowledged this difficulty.  Questions and concerns addressed/Palliative Support Provided.   Objective Assessment: Vital Signs Vitals:   12/20/22 0735 12/20/22 1123  BP: 111/84 123/76  Pulse: 92 99  Resp: (!) 23 (!) 21  Temp: 98 F (36.7 C) 98.2 F (36.8 C)  SpO2: 95% 97%    Intake/Output Summary (Last 24 hours) at 12/20/2022 1140 Last data filed at 12/20/2022 1107 Gross per 24 hour  Intake 3286.44  ml  Output 3750 ml  Net -463.56 ml   Last Weight  Most recent update: 12/20/2022  3:07 AM    Weight  58.7 kg (129 lb 6.6 oz)            Gen:  Frail elderly Caucasian M in NAD HEENT: moist mucous membranes CV: Irregular rate and rhythm  PULM: On 2LPM [place], breathing is even and non-labored ABD: soft/nontender  EXT: No edema  Neuro: Somenolent  SUMMARY OF RECOMMENDATIONS   Full code --> Patients daughter, [name] continues to consider this. Has a "Hard Choices" book to review   Goals at this time are to allow time for outcomes   Ongoing PMT support  Symptom Management:  Delirium: Non-pharmacologic Delirium Precautions  - Frequent re-orientation; update board w/ date, names of treatment team  - Daytime stimulation: Open curtains, turn lights on, and turn on television as tolerated during waking hours  - Nighttime calm - close curtains, turn lights off, and turn off television at 9pm  with minimal interruptions from treatment team during sleeping hours, including avoiding lab draws if possible  - Encourage continued presence of family/friends  - Occupy w/ distractions - e.g. ask them to fold washcloths, busy-board  - Encourage movement with PT/OT as tolerated  - Ensure adequate sensorium, to include providing glasses, dentures, and hearing aids to patient as appropriate  - Ensure adequate nutrition and fluid intake  - Monitor for regular bowel movements  Billing based on MDM: High ______________________________________________________________________________________ Lamarr Lulas [place] Palliative Medicine Team Team Cell Phone: [phone] Please utilize secure chat with additional questions, if there is no response within 30 minutes  please call the above phone number  Palliative Medicine Team providers are available by phone from 7am to 7pm daily and can be reached through the team cell phone.  Should this patient require assistance outside of these hours, please  call the patient's attending physician.

## 2022-12-20 NOTE — Progress Notes (Signed)
Subjective: Pt minimally responsive today. Daughter at bedside  Objective: Vital signs in last 24 hours: Temp:  [98 F (36.7 C)-99 F (37.2 C)] 98.2 F (36.8 C) (08/13 1123) Pulse Rate:  [65-99] 65 (08/13 1233) Resp:  [19-24] 19 (08/13 1233) BP: (108-143)/(63-87) 123/76 (08/13 1123) SpO2:  [91 %-98 %] 97 % (08/13 1233) Weight:  [58.7 kg] 58.7 kg (08/13 0305)  Assessment/Plan:  Intake/Output from previous day: 08/12 0701 - 08/13 0700 In: 2562.2 [I.V.:1807.6; NG/GT:554.7; IV Piggyback:200] Out: 3300 [Urine:3300] # Urinary retention # Prostate cancer # Difficult Foley placement # UTI   UC E. coli (+), sensitivities pending.  Agree with daily Rocephin or now. Over wire placement of 29f council catheter 8/12.  Do not remove until discussing with urology.  Around 2L of tean colored urine returned.  Foley to stay in place for 5 to 7 days at a minimum for bladder rest following stretch injury Daily BMP.  Monitor for postobstructive diuresis Will continue to follow peripherally. Will need to follow up with [facility] on discharge. Please call with questions  Intake/Output this shift: Total I/O In: 852.6 [I.V.:403.1; NG/GT:375; IV Piggyback:74.5] Out: 450 [Urine:450]  Physical Exam:  General: confused CV: No cyanosis Lungs: equal chest rise Abdomen: Soft, NTND, no rebound or guarding Gu: foley in place draining clear yellow urine  Lab Results: Recent Labs    12/18/22 0945 12/19/22 0115 12/20/22 0617  HGB 12.3* 11.4* 9.6*  HCT 37.6* 35.0* 30.2*   BMET Recent Labs    12/19/22 0115 12/20/22 0617  NA 139 143  K 4.0 3.9  CL 110 116*  CO2 18* 21*  GLUCOSE 133* 110*  BUN 16 18  CREATININE 0.86 0.79  CALCIUM 8.5* 8.0*     Studies/Results: ECHOCARDIOGRAM COMPLETE  Result Date: 12/19/2022    ECHOCARDIOGRAM REPORT   Patient Name:   James Bajaj Sr. Date of Exam: 12/19/2022 Medical Rec #:  [id number]             Height:       72.0 in Accession #:     [id number]            Weight:       140.0 lb Date of Birth:  [birth year]              BSA:          1.831 m Patient Age:    89 years              BP:           160/92 mmHg Patient Gender: M                     HR:           97 bpm. Exam Location:  Inpatient Procedure: 2D Echo, Cardiac Doppler and Color Doppler Indications:    Atrial Fibrillation I48.91  History:        Patient has prior history of Echocardiogram examinations, most                 recent 06/23/2020. CHF and Cardiomyopathy, CAD and Previous                 Myocardial Infarction, COPD, Arrythmias:PVC, Tachycardia and                 Atrial Fibrillation; Risk Factors:Hypertension.  Sonographer:    Lucendia Herrlich Referring Phys: [id number] POKHREL  Sonographer Comments: Image acquisition challenging  due to patient behavioral factors. IMPRESSIONS  1. Left ventricular ejection fraction, by estimation, is 50 to 55%. The left ventricle has low normal function. The left ventricle has no regional wall motion abnormalities. Left ventricular diastolic parameters are indeterminate.  2. Right ventricular systolic function is normal. The right ventricular size is normal.  3. The mitral valve is normal in structure. Moderate mitral valve regurgitation. No evidence of mitral stenosis.  4. The aortic valve is normal in structure. Aortic valve regurgitation is not visualized. Mild aortic valve stenosis.  5. There is mild dilatation of the aortic root, measuring 37 mm.  6. The inferior vena cava is normal in size with greater than 50% respiratory variability, suggesting right atrial pressure of 3 mmHg. FINDINGS  Left Ventricle: Left ventricular ejection fraction, by estimation, is 50 to 55%. The left ventricle has low normal function. The left ventricle has no regional wall motion abnormalities. The left ventricular internal cavity size was normal in size. There is no left ventricular hypertrophy. Left ventricular diastolic parameters are indeterminate. Right  Ventricle: The right ventricular size is normal. No increase in right ventricular wall thickness. Right ventricular systolic function is normal. Left Atrium: Left atrial size was normal in size. Right Atrium: Right atrial size was normal in size. Pericardium: There is no evidence of pericardial effusion. Mitral Valve: The mitral valve is normal in structure. Moderate mitral valve regurgitation. No evidence of mitral valve stenosis. Tricuspid Valve: The tricuspid valve is normal in structure. Tricuspid valve regurgitation is not demonstrated. No evidence of tricuspid stenosis. Aortic Valve: The aortic valve is normal in structure. Aortic valve regurgitation is not visualized. Aortic regurgitation PHT measures 740 msec. Mild aortic stenosis is present. Aortic valve mean gradient measures 9.0 mmHg. Aortic valve peak gradient measures 17.6 mmHg. Aortic valve area, by VTI measures 1.66 cm. Pulmonic Valve: The pulmonic valve was normal in structure. Pulmonic valve regurgitation is not visualized. No evidence of pulmonic stenosis. Aorta: There is mild dilatation of the aortic root, measuring 37 mm. Venous: The inferior vena cava is normal in size with greater than 50% respiratory variability, suggesting right atrial pressure of 3 mmHg. IAS/Shunts: No atrial level shunt detected by color flow Doppler.  LEFT VENTRICLE PLAX 2D LVIDd:         4.00 cm   Diastology LVIDs:         2.90 cm   LV e' medial:    12.40 cm/s LV PW:         0.70 cm   LV E/e' medial:  9.0 LV IVS:        1.00 cm   LV e' lateral:   13.50 cm/s LVOT diam:     2.00 cm   LV E/e' lateral: 8.2 LV SV:         52 LV SV Index:   28 LVOT Area:     3.14 cm  RIGHT VENTRICLE             IVC RV S prime:     20.50 cm/s  IVC diam: 1.35 cm TAPSE (M-mode): 1.8 cm LEFT ATRIUM             Index        RIGHT ATRIUM           Index LA diam:        4.00 cm 2.18 cm/m   RA Area:     16.50 cm LA Vol (A2C):   49.4 ml 26.98 ml/m  RA Volume:  40.00 ml  21.85 ml/m LA Vol (A4C):    52.5 ml 28.67 ml/m LA Biplane Vol: 51.2 ml 27.96 ml/m  AORTIC VALVE AV Area (Vmax):    1.49 cm AV Area (Vmean):   1.46 cm AV Area (VTI):     1.66 cm AV Vmax:           210.00 cm/s AV Vmean:          136.000 cm/s AV VTI:            0.311 m AV Peak Grad:      17.6 mmHg AV Mean Grad:      9.0 mmHg LVOT Vmax:         99.40 cm/s LVOT Vmean:        63.367 cm/s LVOT VTI:          0.165 m LVOT/AV VTI ratio: 0.53 AI PHT:            740 msec  AORTA Ao Root diam: 3.70 cm MITRAL VALVE                TRICUSPID VALVE MV Area (PHT): 5.31 cm     TR Peak grad:   36.7 mmHg MV Decel Time: 143 msec     TR Vmax:        303.00 cm/s MR Peak grad: 147.4 mmHg MR Vmax:      607.00 cm/s   SHUNTS MV E velocity: 111.00 cm/s  Systemic VTI:  0.16 m MV A velocity: 123.00 cm/s  Systemic Diam: 2.00 cm MV E/A ratio:  0.90 Kardie Tobb DO Electronically signed by Thomasene Ripple DO Signature Date/Time: 12/19/2022/1:37:51 PM    Final    DG Abd Portable 1V  Result Date: 12/19/2022 CLINICAL DATA:  Feeding tube placement EXAM: PORTABLE ABDOMEN - 1 VIEW COMPARISON:  Chest radiograph done on 12/17/2022 FINDINGS: Tip of feeding tube is seen in the region of the antrum of the stomach. Bowel gas pattern in the upper abdomen is unremarkable. Pelvis is not included in its entirety. Right side of the abdomen is not included in the image. Visualized lung fields are clear. Coronary artery stent is seen. IMPRESSION: Tip of feeding tube is seen in the region of the antrum of the stomach. Electronically Signed   By: Ernie Avena M.D.   On: 12/19/2022 11:05      LOS: 4 days   Elmon Kirschner, NP [facility] Pager: [phone]  12/20/2022, 1:43 PM

## 2022-12-21 ENCOUNTER — Encounter: Payer: Self-pay | Admitting: Physician Assistant

## 2022-12-21 DIAGNOSIS — G9341 Metabolic encephalopathy: Secondary | ICD-10-CM | POA: Diagnosis not present

## 2022-12-21 DIAGNOSIS — R296 Repeated falls: Secondary | ICD-10-CM | POA: Diagnosis not present

## 2022-12-21 DIAGNOSIS — I5022 Chronic systolic (congestive) heart failure: Secondary | ICD-10-CM | POA: Diagnosis not present

## 2022-12-21 DIAGNOSIS — S066XAA Traumatic subarachnoid hemorrhage with loss of consciousness status unknown, initial encounter: Secondary | ICD-10-CM | POA: Diagnosis not present

## 2022-12-21 LAB — GLUCOSE, CAPILLARY
Glucose-Capillary: 112 mg/dL — ABNORMAL HIGH (ref 70–99)
Glucose-Capillary: 103 mg/dL — ABNORMAL HIGH (ref 70–99)
Glucose-Capillary: 124 mg/dL — ABNORMAL HIGH (ref 70–99)
Glucose-Capillary: 122 mg/dL — ABNORMAL HIGH (ref 70–99)
Glucose-Capillary: 131 mg/dL — ABNORMAL HIGH (ref 70–99)
Glucose-Capillary: 125 mg/dL — ABNORMAL HIGH (ref 70–99)

## 2022-12-21 MED ORDER — LORATADINE 10 MG PO TABS
10.0000 mg | ORAL_TABLET | Freq: Every day | ORAL | Status: DC
  Administered 2022-12-21: 10 mg via ORAL
  Filled 2022-12-21: qty 1

## 2022-12-21 MED ORDER — WHITE PETROLATUM EX OINT
TOPICAL_OINTMENT | CUTANEOUS | Status: DC | PRN
  Filled 2022-12-21 (×2): qty 28.35

## 2022-12-21 MED ORDER — LORATADINE 10 MG PO TABS
10.0000 mg | ORAL_TABLET | Freq: Every day | ORAL | Status: DC
  Administered 2022-12-22 – 2023-01-19 (×26): 10 mg
  Filled 2022-12-21 (×27): qty 1

## 2022-12-21 NOTE — Progress Notes (Signed)
Neurosurgery Service Progress Note  Subjective: No acute events overnight, no new complaints,    Objective: Vitals:   12/20/22 1914 12/20/22 2258 12/21/22 0348 12/21/22 0751  BP: 132/79 (!) 146/65 (!) 150/72 133/89  Pulse:    78  Resp: 15   20  Temp: 98.3 F (36.8 C) 98.1 F (36.7 C) 97.8 F (36.6 C) 98.1 F (36.7 C)  TempSrc: Axillary Axillary Axillary Axillary  SpO2:    98%  Weight:      Height:        Physical Exam: Awake/alert, very hard of hearing, answers some questions but Ox1, pupils reactive and conjugate gaze, Fcx4 with persistent encouragement  Assessment & Plan: 89 y.o. man s/p fall with TBI.  -CTH reviewed, expected redistribution and blossoming of contusions, no concerning findings. The read questions an underlying mass due to the edema around the hemorrhage, this wasn't there on prior scans so I think this is quite unlikely, we can repeat the [facility] in a few weeks to make sure it resolves but he doesn't seem likely to cooperate with an MRI at this time, let alone sit still enough to have diagnostic images to find a possible small mass next to a hemorrhage.   [name]   12/21/22 9:08 AM

## 2022-12-21 NOTE — Progress Notes (Signed)
PROGRESS NOTE    James Pioli Sr.  WUJ:[id number] DOB: [birth year] DOA: 12/15/2022 PCP: Blane Ohara, MD    Brief Narrative:   James Nuttle Sr. is a 89 y.o. male with past medical history of Nonsustained V. tach, A-fib, ischemic cardiomyopathy (HFrEF with improved EF 35% improved to 55% 2022) HTN, COPD, hypothyroidism, chronic anemia, frequent falls currently admitted to the neurosurgical service for altered mental status secondary to traumatic subarachnoid hemorrhage, had  runs of A-fib and frequent PVCs on the monitor but was asymptomatic.  Patient was last hospitalized back in February 2022 with NSTEMI with cath showing severe single-vessel disease (heavily calcified mid LAD stenosis up to 90%) which was stented . Recommendation was made for aggressive secondary prevention. Patient was not anticoagulated despite CHA2DS2-VASc score of 4 due to anemia, frail state and frequent falls.  He did not receive a GI workup for his anemia due to frailty. Patient was febrile and hospitalist team was consulted for further management.  During hospitalization, patient continued to remain encephalopathic and was unsafe for p.o. intake so cortrak tube tube was placed in.  He also had significant urinary retention and urology was consulted for coud catheter placement.  At this time patient is pending clinical improvement.  Assessment and plan.  Traumatic subarachnoid hemorrhage  History of frequent falls Acute metabolic encephalopathy Patient admitted on 12/15/22 to the neurosurgical service with traumatic subarachnoid hemorrhage, managed conservatively.  Still remains encephalopathic.  Repeat CT scan of the head on 12/20/2022 was done with new findings.  Neurosurgery has however seen the patient and following.  Plan for repeat CT scan in few weeks.  Will continue to minimize narcotics and sedatives, hypnotics and address delirium.  Required Foley catheter placement by urology for acute urinary retention.   Urinating well.   Nutrition Status: Failure to thrive, poor oral intake, unsafe oral intake Body mass index is 17.55 kg/m.  Nutrition Problem: Severe Malnutrition Etiology: chronic illness (COPD, HFrEF) Signs/Symptoms: severe fat depletion, severe muscle depletion Interventions: Tube feeding, Refer to RD note for recommendations Status post cortrak tube tube placement for hydration and nutrition.  Still encephalopathic.  Will try oral when encephalopathy improves.   E. coli UTI. Urine culture with more than $100,000 of E. coli.  On IV Rocephin.  Sensitive to Rocephin, resistant to ampicillin and cefazolin and nitrofurantoin.    Metabolic encephalopathy, lethargy, confusion.  Lack of oral safety.  Has E. coli UTI with intracranial bleed.  Continue tube feeding. by palliative care and at this time plan is to proceed with current level of care.  Acute urinary retention.   Status post Foley catheter placement by urology.  Plan is to continue for 5 to 7 days and check with urology prior to discontinuation of Foley.     PAF (paroxysmal atrial fibrillation) with RVR. Frequent PVCs with history of NSVT High bleeding risk due to history of falls  Cardiology on board.  Continue metoprolol 25 mg twice daily with 5 mg IV as needed.   2D echocardiogram from 12/19/2022 with LV ejection fraction of 50 to 55%.  Not a good candidate for Watchman device.  TSH was 6.1.  Currently rate controlled.   CAD S/P LAD stent 2022 HFrEF with improved EF(35>55%, 2022)/ischemic cardiomyopathy Elevated troponin but flat.  Continue metoprolol, Lipitor. Will defer continuation of aspirin to neurosurgery.  2 D echocardiogram with preserved LV function.   Hypokalemia On KCl with IV fluids.  Potassium today 4.0.  Will decrease the rate of IV fluids.  Hypophosphatemia.  Replenished with K-Phos IV on 12/20/2022.  Phosphorus level today at 2.6 and has improved.  Anemia Iron level of 10.  Latest hemoglobin 11.4.  Transfuse  as necessary.  Moderate persistent asthma, uncomplicated Continue DuoNebs.   Acquired hypothyroidism TSH of 6.1. Continue levothyroxine 175 mcg daily   Essential hypertension Continue metoprolol.  Leukocytosis to be stable.  Debility, deconditioning, frailty.  PT evaluation when patient is more stable.  Goals of care.  Patient with guarded prognosis.  Palliative care on board.  So far continuing current level of care at this time.   DVT prophylaxis: heparin injection 5,000 Units Start: 12/18/22 1000 SCDs Start: 12/15/22 2202   Code Status:     Code Status: Full Code  Disposition: Uncertain at this time.  Pending clinical improvement.  Status is: Inpatient  Remains inpatient appropriate because: Persistent encephalopathy, traumatic intracranial hemorrhage, E Coli UTI, pending clinical improvement, urinary retention.   Family Communication:  Spoke with the patient's daughter at bedside on 12/21/2022  Procedures:  Foley catheter placement Cortrak tube tube placement.  Antimicrobials:   Rocephin IV  Consults   palliative care Cardiology Neurosurgery Urology  Anti-infectives (From admission, onward)    Start     Dose/Rate Route Frequency Ordered Stop   12/20/22 1130  terbinafine (LAMISIL) tablet 250 mg        250 mg Per Tube Daily 12/20/22 1039     12/19/22 1415  cefTRIAXone (ROCEPHIN) 1 g in sodium chloride 0.9 % 100 mL IVPB        1 g 200 mL/hr over 30 Minutes Intravenous Every 24 hours 12/19/22 1324        Subjective: Today, patient was seen and examined at bedside.  Patient is still appears to be confused disoriented but was little more responsive today.  Still on restraints.  Patient's daughter at bedside.  Objective: Vitals:   12/20/22 1914 12/20/22 2258 12/21/22 0348 12/21/22 0751  BP: 132/79 (!) 146/65 (!) 150/72 133/89  Pulse:    78  Resp: 15   20  Temp: 98.3 F (36.8 C) 98.1 F (36.7 C) 97.8 F (36.6 C) 98.1 F (36.7 C)  TempSrc: Axillary  Axillary Axillary Axillary  SpO2:    98%  Weight:      Height:        Intake/Output Summary (Last 24 hours) at 12/21/2022 1136 Last data filed at 12/21/2022 0528 Gross per 24 hour  Intake 2560.79 ml  Output 825 ml  Net 1735.79 ml   Filed Weights   12/15/22 1841 12/20/22 0305  Weight: 63.5 kg 58.7 kg    Physical Examination: Body mass index is 17.55 kg/m.   General:   Thinly built, somnolent elderly male frail appearing on restraints. HENT:   No scleral pallor or icterus noted.   Laceration over the right supraorbital region.  Dry mucosa.  Cortrak tube tube in place. Chest:   Diminished breath sounds bilaterally.  CVS: S1 &S2 heard. No murmur.  Regular rate and rhythm. Abdomen: Soft, nontender, nondistended.  Bowel sounds are heard.   Extremities: No cyanosis, clubbing or edema.  Foley catheter in place Psych: Somnolent, frail appearing, mildly Communicative, disoriented and confused, CNS: Moving extremities, Skin: Warm and dry.  No rashes noted.  Data Reviewed:   CBC: Recent Labs  Lab 12/17/22 2025 12/18/22 0945 12/19/22 0115 12/20/22 0617 12/21/22 0638  WBC 20.7* 16.3* 17.1* 13.2* 12.3*  HGB 12.9* 12.3* 11.4* 9.6* 10.5*  HCT 38.5* 37.6* 35.0* 30.2* 33.7*  MCV 97.0 98.2  95.6 99.3 103.7*  PLT 230 204 230 186 173    Basic Metabolic Panel: Recent Labs  Lab 12/17/22 2025 12/18/22 0945 12/19/22 0115 12/19/22 1729 12/20/22 0617 12/20/22 2117 12/21/22 0638  NA 135 139 139  --  143  --  141  K 3.2* 3.6 4.0  --  3.9  --  4.0  CL 101 104 110  --  116*  --  114*  CO2 21* 22 18*  --  21*  --  20*  GLUCOSE 139* 130* 133*  --  110*  --  111*  BUN 14 15 16   --  18  --  14  CREATININE 0.80 0.78 0.86  --  0.79  --  0.64  CALCIUM 8.9 8.7* 8.5*  --  8.0*  --  7.9*  MG 2.1 2.2 2.2 2.3 2.2 2.0 1.9  PHOS  --   --   --  2.0* 1.4* 2.6 2.6    Liver Function Tests: Recent Labs  Lab 12/15/22 1840 12/17/22 2025  AST 25 20  ALT 21 18  ALKPHOS 49 49  BILITOT 0.3 0.7   PROT 6.5 6.8  ALBUMIN 3.4* 3.0*     Radiology Studies: CT HEAD WO CONTRAST ( )  Result Date: 12/20/2022 CLINICAL DATA:  Subarachnoid hemorrhage [facility]) EXAM: CT HEAD WITHOUT CONTRAST TECHNIQUE: Contiguous axial images were obtained from the base of the skull through the vertex without intravenous contrast. RADIATION DOSE REDUCTION: This exam was performed according to the departmental dose-optimization program which includes automated exposure control, adjustment of the mA and/or kV according to patient size and/or use of iterative reconstruction technique. COMPARISON:  CT head 12/15/2022 FINDINGS: Brain: Cerebral ventricle sizes are concordant with the degree of cerebral volume loss. Patchy and confluent areas of decreased attenuation are noted throughout the deep and periventricular white matter of the cerebral hemispheres bilaterally, compatible with chronic microvascular ischemic disease. No evidence of large-territorial acute infarction. Question slightly increased in size (limited evaluation due to motion artifact) high frontal bilateral, right greater than left, subarachnoid hemorrhages with question intraparenchymal component. Associated developing mild vasogenic edema on the right. Underlying mass is not excluded. Interval development of a 3 mm right parafalcine subdural hematoma (5:22). Nonspecific bilateral basal ganglia mineralization. No mass effect or midline shift. No hydrocephalus. Basilar cisterns are patent. Vascular: No hyperdense vessel. Atherosclerotic calcifications are present within the cavernous internal carotid arteries. Skull: No acute fracture or focal lesion. Sinuses/Orbits: Paranasal sinuses and mastoid air cells are clear. Bilateral lens replacement. Otherwise the orbits are unremarkable. Other: None. IMPRESSION: 1. Interval development of a 3 mm right parafalcine subdural hematoma. 2. Question slightly increased in size (limited evaluation due to motion artifact) high  frontal bilateral, right greater than left, subarachnoid hemorrhages with question intraparenchymal component. Associated developing mild vasogenic edema on the right. Underlying mass is not excluded. Recommend MRI brain with and without contrast for further evaluation. 3. Unable to evaluate for previously visualized left occipital and right temporal cirrhotic no hemorrhages due to motion artifact. These results will be called to the ordering clinician or representative by the Radiologist Assistant, and communication documented in the PACS or [employer]. Electronically Signed   By: Tish Frederickson M.D.   On: 12/20/2022 19:22      LOS: 5 days    Joycelyn Das, MD [facility] Available via Epic secure chat 7am-7pm After these hours, please refer to coverage provider listed on [email or web address] 12/21/2022, 11:36 AM

## 2022-12-21 NOTE — TOC CM/SW Note (Signed)
Transition of Care [facility]) - Inpatient Brief Assessment   Patient Details  Name: [name]. MRN: [id number] Date of Birth: 1931/10/20  Transition of Care [facility]) CM/SW Contact:    Darrold Span, RN Phone Number: 12/21/2022, 3:36 PM   Clinical Narrative: Pt admitted s/p fall at home [facility], currently confused, restraints, PC consulted and following.  We will continue to monitor patient advancement through interdisciplinary progression rounds. If new patient transition needs arise, please place a TOC consult.   Transition of Care Asessment: Insurance and Status: Insurance coverage has been reviewed Patient has primary care physician: Yes Home environment has been reviewed: home w/ wife Prior level of function:: self with some assistance Prior/Current Home Services: No current home services Social Determinants of Health Reivew: SDOH reviewed no interventions necessary   Transition of care needs: no transition of care needs at this time

## 2022-12-22 DIAGNOSIS — I5022 Chronic systolic (congestive) heart failure: Secondary | ICD-10-CM | POA: Diagnosis not present

## 2022-12-22 DIAGNOSIS — S066XAA Traumatic subarachnoid hemorrhage with loss of consciousness status unknown, initial encounter: Secondary | ICD-10-CM | POA: Diagnosis not present

## 2022-12-22 DIAGNOSIS — G9341 Metabolic encephalopathy: Secondary | ICD-10-CM | POA: Diagnosis not present

## 2022-12-22 DIAGNOSIS — R296 Repeated falls: Secondary | ICD-10-CM | POA: Diagnosis not present

## 2022-12-22 LAB — BASIC METABOLIC PANEL
Anion gap: 7 (ref 5–15)
BUN: 11 mg/dL (ref 8–23)
CO2: 24 mmol/L (ref 22–32)
Calcium: 8.1 mg/dL — ABNORMAL LOW (ref 8.9–10.3)
Chloride: 106 mmol/L (ref 98–111)
Creatinine, Ser: 0.7 mg/dL (ref 0.61–1.24)
GFR, Estimated: >60 mL/min (ref >60)
Glucose, Bld: 113 mg/dL — ABNORMAL HIGH (ref 70–99)
Potassium: 4 mmol/L (ref 3.5–5.1)
Sodium: 137 mmol/L (ref 135–145)

## 2022-12-22 LAB — CBC
HCT: 33.9 % — ABNORMAL LOW (ref 39.0–52.0)
Hemoglobin: 10.9 g/dL — ABNORMAL LOW (ref 13.0–17.0)
MCH: 31.9 pg (ref 26.0–34.0)
MCHC: 32.2 g/dL (ref 30.0–36.0)
MCV: 99.1 fL (ref 80.0–100.0)
Platelets: 189 10*3/uL (ref 150–400)
RBC: 3.42 MIL/uL — ABNORMAL LOW (ref 4.22–5.81)
RDW: 12.5 % (ref 11.5–15.5)
WBC: 14.1 10*3/uL — ABNORMAL HIGH (ref 4.0–10.5)
nRBC: 0 % (ref 0.0–0.2)

## 2022-12-22 LAB — GLUCOSE, CAPILLARY
Glucose-Capillary: 109 mg/dL — ABNORMAL HIGH (ref 70–99)
Glucose-Capillary: 120 mg/dL — ABNORMAL HIGH (ref 70–99)
Glucose-Capillary: 128 mg/dL — ABNORMAL HIGH (ref 70–99)
Glucose-Capillary: 97 mg/dL (ref 70–99)
Glucose-Capillary: 143 mg/dL — ABNORMAL HIGH (ref 70–99)
Glucose-Capillary: 138 mg/dL — ABNORMAL HIGH (ref 70–99)

## 2022-12-22 LAB — MAGNESIUM: Magnesium: 1.8 mg/dL (ref 1.7–2.4)

## 2022-12-22 MED ORDER — BANATROL TF EN LIQD
60.0000 mL | Freq: Two times a day (BID) | ENTERAL | Status: DC
  Administered 2022-12-22 – 2022-12-26 (×10): 60 mL
  Filled 2022-12-22 (×10): qty 60

## 2022-12-22 MED ORDER — DEXTROSE-SODIUM CHLORIDE 5-0.9 % IV SOLN
INTRAVENOUS | Status: DC
  Administered 2022-12-22: 1000 mL via INTRAVENOUS

## 2022-12-22 NOTE — Progress Notes (Signed)
Speech Language Pathology Treatment: Dysphagia  Patient Details Name: James Feick Sr. MRN: [id number] DOB: 11-03-1931 Today's Date: 12/22/2022 Time: 4098-1191 SLP Time Calculation (min) (ACUTE ONLY): 20 min  Assessment / Plan / Recommendation Clinical Impression  Pt more alert per RN and NT. SLP able to fully awaken pt to the point where he sustained eyes open, though cognition still quite impaired (see next note). Pt noted to intermittently cough to clear upper airway secretions. Pt orally defensive and would not allow suction. SLP able to gradually raise pts awareness to cup and straw and pt took a few sips of water which elicited multiple swallow and delayed coughing and swallowing. Suspect aspiration. Pt will need MBS but mentation still not appropriate for exam; will reassess for improved awareness of POs, ability to tolerate mobility without agitation. Note that pt has been admitted with TBI for almost a week with no PT/OT orders. Requested these from MD and also cognitive eval.   HPI HPI: Pt is an 89 y.o. male who presented with AMS after a fall. CT head: Scattered areas of subarachnoid hemorrhage in the left occipital  lobe, bilateral high frontal lobes, and right temporal lobe. Small amount of subarachnoid hemorrhage posterior to the right midbrain extending into the cerebral aqueduct. PMH: COPD, prior NSTEMI / CHF, NSVT, PAF, on ASA81.      SLP Plan  Continue with current plan of care      Recommendations for follow up therapy are one component of a multi-disciplinary discharge planning process, led by the attending physician.  Recommendations may be updated based on patient status, additional functional criteria and insurance authorization.    Recommendations  Diet recommendations: NPO Medication Administration: Via alternative means                  Oral care QID;Staff/trained caregiver to provide oral care   Frequent or constant Supervision/Assistance Dysphagia,  unspecified (R13.10)     Continue with current plan of care     , Riley Nearing  12/22/2022, 10:00 AM

## 2022-12-22 NOTE — Evaluation (Signed)
Physical Therapy Evaluation Patient Details Name: James Okerson Sr. MRN: [id number] DOB: 21-Jul-1931 Today's Date: 12/22/2022  History of Present Illness  Pt is an 89 y.o. male who presented 12/15/22 s/p fall with head trauma. CTH shows diffuse scattered traumatic pattern subarachnoid hemorrhage. Cortrak placed 8/12. PMHx includes COPD, prior NSTEMI / CHF, NSVT, PAF, anemia, bradycardia, senile osteoporosis, CAD, HLD, HTN   Clinical Impression  Pt presents with condition above and deficits mentioned below, see PT Problem List. PTA, he was independent, intermittently utilizing a walking stick for outdoor mobility, [place] in a 1-level house with 2 STE. Per daughter, pt has not had any other falls in the past couple years other than the x3 falls a few days prior to admission. Currently, pt is presenting at a Ranchos level IV, confused/agitated. He became restless and impulsive to try to exit the bed when returned to supine end of session and had moments of verbal agitation towards the therapist. He is not following verbal cues and needs physical guidance to initiate all functional mobility at this time. Noted increased pt response and focus on his daughter and on the sun outside the window. Currently, pt is requiring up to total assist for bed mobility and maxA to transfer to stand with noted retropulsion putting him at high risk for falls. He demonstrates deficits in strength (good resistive strength at times though bil), balance, cognition, and activity tolerance. As pt has had a drastic functional decline and has good family support, he could greatly benefit from intensive inpatient rehab, > 3 hours/day. Will continue to follow acutely.       If plan is discharge home, recommend the following: Two people to help with walking and/or transfers;A lot of help with bathing/dressing/bathroom;Assistance with cooking/housework;Direct supervision/assist for medications management;Direct  supervision/assist for financial management;Assist for transportation;Help with stairs or ramp for entrance   Can travel by private vehicle        Equipment Recommendations Other (comment) (TBA)  Recommendations for Other Services  Rehab consult    Functional Status Assessment Patient has had a recent decline in their functional status and demonstrates the ability to make significant improvements in function in a reasonable and predictable amount of time.     Precautions / Restrictions Precautions Precautions: Fall;Other (comment) Precaution Comments: cortrak; bil mitts and wrist restraints Restrictions Weight Bearing Restrictions: No      Mobility  Bed Mobility Overal bed mobility: Needs Assistance Bed Mobility: Supine to Sit, Sit to Supine     Supine to sit: Total assist, HOB elevated, Contact guard Sit to supine: Total assist, HOB elevated   General bed mobility comments: Pt needing total assist to manage legs on/off bed with pt actively moving them the opposite direction than desired. Total assist for trunk management as well. However, pt able to come to long sitting when desiring to impulsively exit bed end of session, contact guard assist for safety, needing assistance to lay back down.    Transfers Overall transfer level: Needs assistance Equipment used: 1 person hand held assist Transfers: Sit to/from Stand Sit to Stand: Max assist           General transfer comment: Pt cued to place UEs on therapist anterior to him for support, bil knees blocked, and maxA provided to shift weight anteriorly, power pt up to stand, and gain pt's balance in standing from EOB 2x. Noted feet sliding anteriorly and pt maintaining a flexed posture with knees partially flexed, thus returned  pt to supine for safety    Ambulation/Gait               General Gait Details: unable at this time  Stairs            Wheelchair Mobility     Tilt Bed    Modified Rankin  (Stroke Patients Only)       Balance Overall balance assessment: Needs assistance Sitting-balance support: No upper extremity supported, Feet supported Sitting balance-Leahy Scale: Poor Sitting balance - Comments: Pt leans posteriorly, needing modA initially and fading to minA with intermittent moments of contact guard assist. Postural control: Posterior lean Standing balance support: Bilateral upper extremity supported Standing balance-Leahy Scale: Poor Standing balance comment: MaxA and bil knees blocked with bil HHA to stand 2x briefly. Noted flexed posture and feet sliding anteriorly with pt leaning posteriorly.                             Pertinent Vitals/Pain Pain Assessment Pain Assessment: Faces Faces Pain Scale: Hurts little more Pain Location: faces with attempts to wash it with washcloth Pain Descriptors / Indicators: Grimacing Pain Intervention(s): Limited activity within patient's tolerance, Monitored during session    Home Living Family/patient expects to be discharged to:: Private residence Living Arrangements: Spouse/significant other Available Help at Discharge: Family;Available 24 hours/day (wife unable to assist much though as she has had multiple CVAs and uses a RW) Type of Home: House Home Access: Stairs to enter Entrance Stairs-Rails: Right Entrance Stairs-Number of Steps: 2   Home Layout: One level Home Equipment: Hand held shower head;Shower seat;Toilet riser;BSC/3in1      Prior Function Prior Level of Function : Independent/Modified Independent             Mobility Comments: No AD majority of time, uses walking stick intermittently outside; no other falls other than the x3 falls a few days prior to admission ADLs Comments: Wife uses RW and has had multiple CVAs so pt does majority of household chores; has farm with farm animals, like goats and farm cats; family provides meals for pt and wife     Extremity/Trunk Assessment   Upper  Extremity Assessment Upper Extremity Assessment: Right hand dominant;Defer to OT evaluation    Lower Extremity Assessment Lower Extremity Assessment: Generalized weakness;RLE deficits/detail;LLE deficits/detail RLE Deficits / Details: spontaneously actively moves leg, primarily wiggling toes or dorsiflexing/plantarflexing ankle; actively resists knee flexion/extension intermittently; withdraws to noxious light stimuli distally; unclear if one side is weaker etc than the other as pt is unable to follow commands this date LLE Deficits / Details: spontaneously actively moves leg, primarily wiggling toes or dorsiflexing/plantarflexing ankle; actively resists knee flexion/extension intermittently; withdraws to noxious light stimuli distally; unclear if one side is weaker etc than the other as pt is unable to follow commands this date       Communication   Communication Communication: Hearing impairment (hearing aides (not currently here))  Cognition Arousal: Lethargic, Alert Behavior During Therapy: Agitated, Restless, Flat affect, Impulsive Overall Cognitive Status: Impaired/Different from baseline Area of Impairment: Memory, Attention, Following commands, Safety/judgement, Awareness, Problem solving, Rancho level               Rancho Levels of Cognitive Functioning [place]igos Scales of Cognitive Functioning: Confused/Agitated: Maximal Assistance   Current Attention Level: Focused Memory: Decreased short-term memory Following Commands: Follows one step commands inconsistently, Follows one step commands with increased time Safety/Judgement: Decreased awareness of safety, Decreased  awareness of deficits Awareness: Intellectual Problem Solving: Slow processing, Decreased initiation, Difficulty sequencing, Requires verbal cues, Requires tactile cues General Comments: Pt lethargic with eyes closed in supine, even when attempting to wash his face or manually open his eyes. Eyes opened  once dependently sat up EOB. Pt perked up with improved eye contact to look at sun out the window and to look at his daughter. Occasional eye contact with PT. Pt verbally responding intermittently to questions or phrases, stating "what?", "alright", and "get me up". Does not follow cues to actively move though, needing physical guidance to initiate. Pt restless to mess with lines and to sit up in long sitting to try to exit bed when cued to lay down end of session. Intermittent agitation noted with pt getting upset at therapist trying to cue him to participate.   Rancho Mirant Scales of Cognitive Functioning: Confused/Agitated: Maximal Assistance [IV]    General Comments General comments (skin integrity, edema, etc.): daughter present throughout and supportive; VSS on RA; enocuraged natural light during day and use of music as able while also allowing pt to rest as needed    Exercises     Assessment/Plan    PT Assessment Patient needs continued PT services  PT Problem List Decreased strength;Decreased activity tolerance;Decreased balance;Decreased coordination;Decreased mobility;Decreased cognition;Decreased safety awareness;Decreased knowledge of use of DME       PT Treatment Interventions DME instruction;Gait training;Stair training;Functional mobility training;Therapeutic activities;Therapeutic exercise;Balance training;Neuromuscular re-education;Cognitive remediation;Patient/family education    PT Goals (Current goals can be found in the Care Plan section)  Acute Rehab PT Goals Patient Stated Goal: to sit up; daughter hopes for pt to improve PT Goal Formulation: With patient/family Time For Goal Achievement: 01/05/23 Potential to Achieve Goals: Good    Frequency Min 1X/week     Co-evaluation               AM-PAC PT "6 Clicks" Mobility  Outcome Measure Help needed turning from your back to your side while in a flat bed without using bedrails?: Total Help needed moving  from lying on your back to sitting on the side of a flat bed without using bedrails?: Total Help needed moving to and from a bed to a chair (including a wheelchair)?: Total Help needed standing up from a chair using your arms (e.g., wheelchair or bedside chair)?: A Lot Help needed to walk in hospital room?: Total Help needed climbing 3-5 steps with a railing? : Total 6 Click Score: 7    End of Session   Activity Tolerance: Patient tolerated treatment well Patient left: in bed;with call bell/phone within reach;with bed alarm set;with family/visitor present;with restraints reapplied Nurse Communication: Mobility status;Other (comment) (bed turned towards window - NT) PT Visit Diagnosis: Unsteadiness on feet (R26.81);Other abnormalities of gait and mobility (R26.89);Muscle weakness (generalized) (M62.81);Difficulty in walking, not elsewhere classified (R26.2);Other symptoms and signs involving the nervous system (R29.898)    Time: 8295-6213 PT Time Calculation (min) (ACUTE ONLY): 40 min   Charges:   PT Evaluation $PT Eval Moderate Complexity: 1 Mod PT Treatments $Therapeutic Activity: 23-37 mins PT General Charges $$ ACUTE PT VISIT: 1 Visit         Raymond Gurney, PT, DPT Acute Rehabilitation Services  Office: [phone]   Jewel Baize 12/22/2022, 5:15 PM

## 2022-12-22 NOTE — Progress Notes (Signed)
PROGRESS NOTE    [name].  ZOX:[id number] DOB: 1932/03/29 DOA: 12/15/2022 PCP: Blane Ohara, MD    Brief Narrative:   [name]. is a 89 y.o. male with past medical history of Nonsustained V. tach, A-fib, ischemic cardiomyopathy (HFrEF with improved EF 35% improved to 55% 2022) HTN, COPD, hypothyroidism, chronic anemia, frequent falls currently admitted to the neurosurgical service for altered mental status secondary to traumatic subarachnoid hemorrhage, had  runs of A-fib and frequent PVCs on the monitor but was asymptomatic.  Patient was last hospitalized back in February 2022 with NSTEMI with cath showing severe single-vessel disease (heavily calcified mid LAD stenosis up to 90%) which was stented . Recommendation was made for aggressive secondary prevention. Patient was not anticoagulated despite CHA2DS2-VASc score of 4 due to anemia, frail state and frequent falls.  He did not receive a GI workup for his anemia due to frailty. Patient was febrile and hospitalist team was consulted for further management.  During hospitalization, patient continued to remain encephalopathic and was seen by speech therapy, unsafe for p.o. intake so cortrak tube tube was placed in.  He also had significant urinary retention and urology was consulted for coud catheter placement.  At this time patient is pending clinical improvement.  Will likely need placement.  Will consult PT evaluation today.  Assessment and plan.  Traumatic subarachnoid hemorrhage  History of frequent falls Acute metabolic encephalopathy Patient admitted on 12/15/22 to the neurosurgical service with traumatic subarachnoid hemorrhage, managed conservatively.  Still remains encephalopathic more alert today..  Repeat CT scan of the head on 12/20/2022 was done with new findings.  Neurosurgery following the patient and plan for repeat CT scan in few weeks.  Will continue to minimize narcotics and sedatives, hypnotics and address  delirium.  Required Foley catheter placement by urology for acute urinary retention.  Urinating well.   Nutrition Status: Failure to thrive, poor oral intake, unsafe oral intake Body mass index is 17.58 kg/m.  Nutrition Problem: Severe Malnutrition Etiology: chronic illness (COPD, HFrEF) Signs/Symptoms: severe fat depletion, severe muscle depletion Interventions: Tube feeding, Refer to RD note for recommendations Status post cortrak tube tube placement for hydration and nutrition.  Still encephalopathic.  Will try oral when encephalopathy improves.  Speech therapy evaluation today still recommends n.p.o.   E. coli UTI. Urine culture with more than $100,000 of E. coli.  On IV Rocephin.  Sensitive to Rocephin, resistant to ampicillin and cefazolin and nitrofurantoin.    Acute urinary retention.   Status post Foley catheter placement by urology.  Plan is to continue for 5 to 7 days and check with urology prior to discontinuation of Foley.     PAF (paroxysmal atrial fibrillation) with RVR. Frequent PVCs with history of NSVT High bleeding risk due to history of falls  Cardiology on board.  Continue metoprolol 25 mg twice daily with 5 mg IV as needed.   2D echocardiogram from 12/19/2022 with LV ejection fraction of 50 to 55%.  Not a good candidate for Watchman device.  TSH was 6.1.  Currently rate controlled.   CAD S/P LAD stent 2022 HFrEF with improved EF(35>55%, 2022)/ischemic cardiomyopathy Elevated troponin but flat.  Continue metoprolol, Lipitor. Will defer  addition of aspirin to neurosurgery.  2 D echocardiogram with preserved LV function.   Hypokalemia On KCl with IV fluids.  Potassium today 4.0.  Will continue fluids without potassium.  Check BMP in AM.  Hypophosphatemia.  Replenished with K-Phos IV on 12/20/2022.  Phosphorus level  improved to 2.6.  Anemia Iron level of 10.  Latest hemoglobin 10.9.  Transfuse as necessary for hemoglobin less than 7.  No evidence of  bleeding.  Moderate persistent asthma, uncomplicated Continue DuoNebs.  Currently on room air.   Acquired hypothyroidism TSH of 6.1. Continue levothyroxine 175 mcg daily   Essential hypertension Continue metoprolol.  Blood pressure seems to be stable.  Debility, deconditioning, frailty.  Patient is more alert today so we will get PT evaluation.  Goals of care.  Patient with guarded prognosis.  Palliative care on board.  Plan is to continue current level of care at this time.   DVT prophylaxis: heparin injection 5,000 Units Start: 12/18/22 1000 SCDs Start: 12/15/22 2202   Code Status:     Code Status: Full Code  Disposition: Uncertain at this time.  Pending clinical improvement.  Will get PT evaluation.  Status is: Inpatient  Remains inpatient appropriate because: Persistent encephalopathy, traumatic intracranial hemorrhage, E Coli UTI, pending clinical improvement, urinary retention.   Family Communication:  Spoke with the patient's daughter at bedside on 12/21/2022  Procedures:  Foley catheter placement Cortrak tube tube placement.  Antimicrobials:   Rocephin IV  Consults   palliative care Cardiology Neurosurgery Urology  Anti-infectives (From admission, onward)    Start     Dose/Rate Route Frequency Ordered Stop   12/20/22 1130  terbinafine (LAMISIL) tablet 250 mg        250 mg Per Tube Daily 12/20/22 1039     12/19/22 1415  cefTRIAXone (ROCEPHIN) 1 g in sodium chloride 0.9 % 100 mL IVPB        1 g 200 mL/hr over 30 Minutes Intravenous Every 24 hours 12/19/22 1324        Subjective: Today, patient was seen and examined at bedside.  Appears to be little more alert and spoke few words like "good" still confused and disoriented.   Objective: Vitals:   12/22/22 0500 12/22/22 0730 12/22/22 1155 12/22/22 1529  BP:  134/74 128/71 138/77  Pulse:  80 (!) 59 67  Resp:  20 (!) 22 (!) 21  Temp:  98.7 F (37.1 C) 98.8 F (37.1 C) 98.8 F (37.1 C)  TempSrc:   Axillary Oral Oral  SpO2:  100% 99% 98%  Weight: 58.8 kg     Height:        Intake/Output Summary (Last 24 hours) at 12/22/2022 1630 Last data filed at 12/22/2022 0738 Gross per 24 hour  Intake --  Output 1900 ml  Net -1900 ml   Filed Weights   12/15/22 1841 12/20/22 0305 12/22/22 0500  Weight: 63.5 kg 58.7 kg 58.8 kg    Physical Examination: Body mass index is 17.58 kg/m.   General:   Thinly built, little more alert,, elderly male frail appearing  HENT:   No scleral pallor or icterus noted.   Laceration over the right supraorbital region.  Moist Ko[place].  Cortrak tube tube in place. Chest:   Diminished breath sounds bilaterally.  Obvious crackles or wheezes. CVS: S1 &S2 heard. No murmur.  Regular rate and rhythm. Abdomen: Soft, nontender, nondistended.  Bowel sounds are heard.   Extremities: No cyanosis, clubbing or edema.  Foley catheter in place Psych: , frail appearing, more alert, confused, on mittens. CNS: Moving extremities, Skin: Warm and dry.  No rashes noted.  Data Reviewed:   CBC: Recent Labs  Lab 12/18/22 0945 12/19/22 0115 12/20/22 0617 12/21/22 0638 12/22/22 0358  WBC 16.3* 17.1* 13.2* 12.3* 14.1*  HGB 12.3* 11.4*  9.6* 10.5* 10.9*  HCT 37.6* 35.0* 30.2* 33.7* 33.9*  MCV 98.2 95.6 99.3 103.7* 99.1  PLT 204 230 186 173 189    Basic Metabolic Panel: Recent Labs  Lab 12/18/22 0945 12/19/22 0115 12/19/22 1729 12/20/22 0617 12/20/22 2117 12/21/22 0638 12/21/22 1810 12/22/22 0358  NA 139 139  --  143  --  141  --  137  K 3.6 4.0  --  3.9  --  4.0  --  4.0  CL 104 110  --  116*  --  114*  --  106  CO2 22 18*  --  21*  --  20*  --  24  GLUCOSE 130* 133*  --  110*  --  111*  --  113*  BUN 15 16  --  18  --  14  --  11  CREATININE 0.78 0.86  --  0.79  --  0.64  --  0.70  CALCIUM 8.7* 8.5*  --  8.0*  --  7.9*  --  8.1*  MG 2.2 2.2 2.3 2.2 2.0 1.9 1.8 1.8  PHOS  --   --  2.0* 1.4* 2.6 2.6 2.5  --     Liver Function Tests: Recent Labs  Lab  12/15/22 1840 12/17/22 2025  AST 25 20  ALT 21 18  ALKPHOS 49 49  BILITOT 0.3 0.7  PROT 6.5 6.8  ALBUMIN 3.4* 3.0*     Radiology Studies: No results found.    LOS: 6 days    Joycelyn Das, MD [facility] Available via Epic secure chat 7am-7pm After these hours, please refer to coverage provider listed on [email or web address] 12/22/2022, 4:30 PM

## 2022-12-22 NOTE — Progress Notes (Signed)
   Inpatient Rehab Admissions Coordinator :  Per therapy recommendations patient was screened for CIR candidacy by Barbara Boyette RN MSN. Patient is not yet at a level to tolerate the intensity required to pursue a CIR admit . Patient may have the potential to progress to become a candidate. The CIR admissions team will follow and monitor for progress and place a Rehab Consult order if felt to be appropriate. Please contact me with any questions.  Barbara Boyette RN MSN Admissions Coordinator [phone]  

## 2022-12-22 NOTE — Progress Notes (Signed)
Neurosurgery Service Progress Note  Subjective: No acute events overnight, no new complaints this morning  Objective: Vitals:   12/21/22 2322 12/22/22 0308 12/22/22 0500 12/22/22 0730  BP: 139/77 (!) 151/76  134/74  Pulse: 84 85  80  Resp: 20 18  20   Temp: 99.6 F (37.6 C) 98.8 F (37.1 C)  98.7 F (37.1 C)  TempSrc: Axillary Axillary  Axillary  SpO2: 98% 99%  100%  Weight:   58.8 kg   Height:        Physical Exam: Awake/alert, very hard of hearing, answers some questions but Ox1, pupils reactive and conjugate gaze, Fcx4 with persistent encouragement  Assessment & Plan: 89 y.o. man s/p fall with TBI.  -no change in neurosugical plan of care  Jadene Pierini  12/22/22 11:20 AM

## 2022-12-22 NOTE — Plan of Care (Signed)
  Problem: Education: Goal: Knowledge of General Education information will improve Description Including pain rating scale, medication(s)/side effects and non-pharmacologic comfort measures Outcome: Progressing   

## 2022-12-22 NOTE — Evaluation (Signed)
Speech Language Pathology Evaluation Patient Details Name: [name]. MRN: [id number] DOB: 12-26-1931 Today's Date: 12/22/2022 Time: 0865-7846 SLP Time Calculation (min) (ACUTE ONLY): 20 min  Problem List:  Patient Active Problem List   Diagnosis Date Noted   Protein-calorie malnutrition, severe 12/19/2022   Urinary retention 12/19/2022   Frequent falls 12/17/2022   Acute metabolic encephalopathy 12/17/2022   Chronic HFrEF with improved EF(heart failure with reduced ejection fraction) (HCC) 12/17/2022   At high risk for bleeding 12/17/2022   Ischemic cardiomyopathy 12/17/2022   SIRS (systemic inflammatory response syndrome) (HCC) 12/17/2022   Hypokalemia 12/17/2022   Subarachnoid hemorrhage (HCC) 12/15/2022   History of recent fall 12/14/2022   Encounter for prostate cancer screening 07/20/2022   Acute cystitis without hematuria 07/20/2022   Urinary frequency 07/20/2022   Hoarse voice quality 11/14/2020   CAD S/P LAD stent 2022 07/07/2020   Cancer (HCC)    PAF (paroxysmal atrial fibrillation) (HCC) 06/25/2020   NSTEMI (non-ST elevated myocardial infarction) (HCC) 06/21/2020   History of prostate cancer    Other fatigue 02/05/2020   Anemia 02/05/2020   Moderate persistent asthma, uncomplicated 01/21/2020   Asthma-COPD overlap syndrome 01/21/2020   Acquired hypothyroidism 08/15/2019   Mixed conductive and sensorineural hearing loss, 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   Malnutrition of moderate degree (HCC) 08/15/2019   Senile osteoporosis 08/15/2019   Nonsustained ventricular tachycardia (HCC) 01/30/2019   Frequent PVCs 11/28/2018   Essential hypertension 11/28/2018   LVH (left ventricular hypertrophy) 11/28/2018   Hyperlipidemia, mixed 08/21/2008   SINUSITIS, ACUTE 06/28/2007   Seasonal and perennial allergic rhinitis 05/17/2007   Past Medical History:  Past Medical History:  Diagnosis Date   Acquired hypothyroidism 08/15/2019   Acute systolic heart failure (HCC) 06/25/2020    Anemia 02/05/2020   boarderline anemic   Atopic dermatitis 12/05/2019   Bacterial pneumonia, unspecified 06/14/2012   06/14/2012 CXR w/ RML PNA >Zithromax and Rocephin     Bradycardia 11/28/2018   CAD (coronary artery disease) 07/07/2020   Cancer (HCC)    prostate cancer-seed implant   Closed fracture dislocation of right elbow    Closed fracture of right olecranon process 04/10/2020   COPD (chronic obstructive pulmonary disease) (HCC) 01/21/2020   Dermatitis 02/05/2020   Dizziness 02/19/2016   Essential hypertension 11/28/2018   History of prostate cancer    seed implant   Hyperlipemia    Hyperlipidemia, mixed 08/21/2008   Qualifier: Diagnosis of  By: Maple Hudson MD, Clinton D    LVH (left ventricular hypertrophy) 11/28/2018   Malnutrition of moderate degree (HCC) 08/15/2019   Mixed conductive and sensorineural hearing loss, 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   Moderate persistent asthma, uncomplicated 01/21/2020   Nonsustained ventricular tachycardia (HCC) 01/30/2019   NSTEMI (non-ST elevated myocardial infarction) (HCC) 06/21/2020   Other fatigue 02/05/2020   PAF (paroxysmal atrial fibrillation) (HCC) 06/25/2020   Seasonal and perennial allergic rhinitis 05/17/2007       Senile osteoporosis 08/15/2019   SINUSITIS, ACUTE 06/28/2007   Qualifier: Diagnosis of  By: Clent Ridges NP, Tammy     Ventricular ectopy 11/28/2018   Past Surgical History:  Past Surgical History:  Procedure Laterality Date   APPENDECTOMY     CORONARY ATHERECTOMY N/A 06/24/2020   Procedure: CORONARY ATHERECTOMY;  Surgeon: Iran Ouch, MD;  Location: [facility];  Service: Cardiovascular;  Laterality: N/A;   CORONARY ULTRASOUND/IVUS N/A 06/24/2020   Procedure: Intravascular Ultrasound/IVUS;  Surgeon: Iran Ouch, MD;  Location: [facility];  Service: Cardiovascular;  Laterality: N/A;   LEFT  HEART CATH AND CORONARY ANGIOGRAPHY N/A 06/22/2020   Procedure: LEFT HEART CATH AND CORONARY ANGIOGRAPHY;  Surgeon: Yvonne Kendall, MD;   Location: [facility];  Service: Cardiovascular;  Laterality: N/A;   ORIF ELBOW FRACTURE Right 04/10/2020   Procedure: OPEN REDUCTION INTERNAL FIXATION (ORIF) ELBOW/OLECRANON FRACTURE;  Surgeon: Teryl Lucy, MD;  Location: [facility];  Service: Orthopedics;  Laterality: Right;   HPI:  Pt is an 89 y.o. male who presented with AMS after a fall. CT head: Scattered areas of subarachnoid hemorrhage in the left occipital  lobe, bilateral high frontal lobes, and right temporal lobe. Small amount of subarachnoid hemorrhage posterior to the right midbrain extending into the cerebral aqueduct. PMH: COPD, prior NSTEMI / CHF, NSVT, PAF, on ASA81.   Assessment / Plan / Recommendation Clinical Impression  Pt demonstrates cognitive impairment following TBI with multiple days of poor arousal. Today pt observed to be alert during bath and SLP repositioned pt upright, turned on lights and removed restraints with close supervision. Pt initially defensive, fearful of touch to face. Pt eventually opened eyes more often and sustained eyes open with fleeting focused attention to speaker. Not tracking items in visual field but occasionally focused on them or blank staring. Pt did not follow commands, did not grasp cup with cues. Responded to water to lips reflexively initially and then with some initiation finally. Pt attempted to verbalize something when asked his name, but unintelligible. Suggested PT/OT to MD to improve mobility and cognition. Will f/u    SLP Assessment  SLP Recommendation/Assessment: Patient needs continued Speech Lanaguage Pathology Services SLP Visit Diagnosis: Dysphagia, unspecified (R13.10)    Recommendations for follow up therapy are one component of a multi-disciplinary discharge planning process, led by the attending physician.  Recommendations may be updated based on patient status, additional functional criteria and insurance authorization.    Follow Up  Recommendations  Skilled nursing-short term rehab (<3 hours/day)    Assistance Recommended at Discharge  Frequent or constant Supervision/Assistance  Functional Status Assessment Patient has had a recent decline in their functional status and demonstrates the ability to make significant improvements in function in a reasonable and predictable amount of time.  Frequency and Duration min 2x/week  2 weeks      SLP Evaluation Cognition  Overall Cognitive Status: Impaired/Different from baseline Arousal/Alertness: Lethargic Orientation Level: Oriented to person Attention: Focused Focused Attention: Impaired Focused Attention Impairment: Verbal basic;Functional basic Rancho Mirant Scales of Cognitive Functioning: Localized Response: Total Assistance       Comprehension  Auditory Comprehension Overall Auditory Comprehension: Impaired Yes/No Questions: Impaired Basic Biographical Questions: 26-50% accurate Commands: Impaired One Step Basic Commands: 0-24% accurate    Expression Verbal Expression Overall Verbal Expression: Impaired Initiation: Impaired Automatic Speech: Name Level of Generative/Spontaneous Verbalization: Word Repetition: Impaired Level of Impairment: Word level   Oral / Motor  Oral Motor/Sensory Function Overall Oral Motor/Sensory Function: Other (comment) (doesnt follow oral motor commands) Motor Speech Overall Motor Speech: Impaired Respiration: Impaired Level of Impairment: Word Phonation: Low vocal intensity Articulation: Impaired Level of Impairment: Word            , Riley Nearing 12/22/2022, 11:12 AM

## 2022-12-22 NOTE — Progress Notes (Signed)
Nutrition Follow-up  DOCUMENTATION CODES:   Severe malnutrition in context of chronic illness  INTERVENTION:   Continue tube feeds via Cortrak: - Osmolite 1.2 @ 65 ml/hr (1560 ml/day)  Tube feeding regimen provides 1872 kcal, 87 grams of protein, and 1279 ml of H2O.  - Once IV fluids are discontinued, recommend ordering free water flushes of 150 ml every 6 hours for a total of 1879 ml free water daily  - Add Banatrol TF BID per tube  NUTRITION DIAGNOSIS:   Severe Malnutrition related to chronic illness (COPD, HFrEF) as evidenced by severe fat depletion, severe muscle depletion.  Ongoing, being addressed via TF  GOAL:   Patient will meet greater than or equal to 90% of their needs  Met via TF  MONITOR:   Diet advancement, Labs, Weight trends, TF tolerance  REASON FOR ASSESSMENT:   Consult Assessment of nutrition requirement/status, Enteral/tube feeding initiation and management  ASSESSMENT:   89 year old male who presented to the ED on 8/08 after a fall with head trauma. PMH of COPD, prior NSTEMI, HFrEF, PAF, HTN, HLD. Pt admitted with SAH, acute metabolic encephalopathy.  08/09 - diet advanced to full liquids 08/11 - SLP evaluation, recommendation for full liquid diet 08/12 - SLP recommending NPO due to lethargy, Cortrak placed (tip gastric)  Spoke with pt's daughter at bedside. Pt moving around in bed with eyes closed at time of RD visit. Noted SLP continues to recommend NPO with plan for MBS when mentation is more appropriate.  Pt's daughter shares that pt was more alert yesterday with eyes opened and stating that he needed to get home to get things done. Daughter also reports pt with diarrhea. RD to add Banatrol TF to bulk stool.  Medications reviewed and include: colace, pepcid, thiamine, IV abx IVF: NS with KCl 40 mEq/L @ 75 ml/hr  Labs reviewed: WBC 14.1 CBG's: 109-131 x 24 hours  UOP: 2000 ml x 24 hours  Diet Order:   Diet Order             Diet  NPO time specified  Diet effective now                   EDUCATION NEEDS:   Education needs have been addressed (spoke with pt's daughter)  Skin:  Skin Assessment: Reviewed RN Assessment (laceration R eye)  Last BM:  12/22/22 medium type 7  Height:   Ht Readings from Last 1 Encounters:  12/16/22 6' (1.829 m)    Weight:   Wt Readings from Last 1 Encounters:  12/22/22 58.8 kg    Ideal Body Weight:  80.9 kg  BMI:  Body mass index is 17.58 kg/m.  Estimated Nutritional Needs:   Kcal:  1750-1950  Protein:  80-95 grams  Fluid:  1.7-1.9 L    Mertie Clause, MS, RD, LDN Registered Dietitian II Please see AMiON for contact information.

## 2022-12-23 DIAGNOSIS — Z7189 Other specified counseling: Secondary | ICD-10-CM | POA: Diagnosis not present

## 2022-12-23 DIAGNOSIS — Z515 Encounter for palliative care: Secondary | ICD-10-CM | POA: Diagnosis not present

## 2022-12-23 DIAGNOSIS — I609 Nontraumatic subarachnoid hemorrhage, unspecified: Secondary | ICD-10-CM | POA: Diagnosis not present

## 2022-12-23 LAB — PHOSPHORUS: Phosphorus: 3.5 mg/dL (ref 2.5–4.6)

## 2022-12-23 LAB — BASIC METABOLIC PANEL
Anion gap: 9 (ref 5–15)
BUN: 10 mg/dL (ref 8–23)
CO2: 24 mmol/L (ref 22–32)
Calcium: 8.3 mg/dL — ABNORMAL LOW (ref 8.9–10.3)
Chloride: 101 mmol/L (ref 98–111)
Creatinine, Ser: 0.75 mg/dL (ref 0.61–1.24)
GFR, Estimated: >60 mL/min (ref >60)
Glucose, Bld: 123 mg/dL — ABNORMAL HIGH (ref 70–99)
Potassium: 4.2 mmol/L (ref 3.5–5.1)
Sodium: 134 mmol/L — ABNORMAL LOW (ref 135–145)

## 2022-12-23 LAB — GLUCOSE, CAPILLARY
Glucose-Capillary: 110 mg/dL — ABNORMAL HIGH (ref 70–99)
Glucose-Capillary: 127 mg/dL — ABNORMAL HIGH (ref 70–99)
Glucose-Capillary: 159 mg/dL — ABNORMAL HIGH (ref 70–99)
Glucose-Capillary: 134 mg/dL — ABNORMAL HIGH (ref 70–99)
Glucose-Capillary: 138 mg/dL — ABNORMAL HIGH (ref 70–99)
Glucose-Capillary: 120 mg/dL — ABNORMAL HIGH (ref 70–99)

## 2022-12-23 LAB — CBC
HCT: 35.3 % — ABNORMAL LOW (ref 39.0–52.0)
Hemoglobin: 11.6 g/dL — ABNORMAL LOW (ref 13.0–17.0)
MCH: 31.2 pg (ref 26.0–34.0)
MCHC: 32.9 g/dL (ref 30.0–36.0)
MCV: 94.9 fL (ref 80.0–100.0)
Platelets: 243 10*3/uL (ref 150–400)
RBC: 3.72 MIL/uL — ABNORMAL LOW (ref 4.22–5.81)
RDW: 12.3 % (ref 11.5–15.5)
WBC: 18.1 10*3/uL — ABNORMAL HIGH (ref 4.0–10.5)
nRBC: 0 % (ref 0.0–0.2)

## 2022-12-23 LAB — MAGNESIUM: Magnesium: 1.9 mg/dL (ref 1.7–2.4)

## 2022-12-23 NOTE — Evaluation (Signed)
Occupational Therapy Evaluation Patient Details Name: James Digges Sr. MRN: [id number] DOB: Dec 06, 1931 Today's Date: 12/23/2022   History of Present Illness Pt is an 89 y.o. male who presented 12/15/22 s/p fall with head trauma. CTH shows diffuse scattered traumatic pattern subarachnoid hemorrhage. Cortrak placed 8/12. 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 Utilizes a walking stick PRN.  His children do typically provide meals for Pt and his wife daily. Today he is limited by cognition, HOH, vision, balance, activity tolerance, and will benefit from skilled OT in the acute setting as overall he requires max A for UB and LB ADL and max A +2 to total A +2 for mobility at this time. Recommending post-acute OT at the >3 hour level as he was independent and has excellent family support. [name] very painful and tender during session, RN aware. Next session to continue to focus on OOB activity and Bil UE tasks in addition to visual assessment  Note: daughter says that she will bring in hearing aides for during the day.       If plan is discharge home, recommend the following: Two people to help with walking and/or transfers;A lot of help with bathing/dressing/bathroom;Direct supervision/assist for medications management;Direct supervision/assist for financial management;Assist for transportation;Help with stairs or ramp for entrance;Supervision due to cognitive status    Functional Status Assessment  Patient has had a recent decline in their functional status and demonstrates the ability to make significant improvements in function in a reasonable and predictable amount of time.  Equipment Recommendations  Other (comment) (defer to next venue of care)    Recommendations for Other Services Rehab consult;PT consult;Speech  consult     Precautions / Restrictions Precautions Precautions: Fall;Other (comment) Precaution Comments: cortrak; bil mitts and wrist restraints Restrictions Weight Bearing Restrictions: No      Mobility Bed Mobility Overal bed mobility: Needs Assistance Bed Mobility: Supine to Sit     Supine to sit: Max assist, +2 for physical assistance, +2 for safety/equipment, HOB elevated     General bed mobility comments: max A +2 with max multimodal cues for legs off the bed, pushing up into sitting, and seated balance. use of pad to bring hips EOB. Strong posterior and right lean in sitting    Transfers Overall transfer level: Needs assistance Equipment used: 2 person hand held assist Transfers: Bed to chair/wheelchair/BSC Sit to Stand: Total assist, +2 physical assistance, +2 safety/equipment, From elevated surface   Squat pivot transfers: Total assist, +2 physical assistance, +2 safety/equipment, From elevated surface       General transfer comment: Pt with strong posterior bias, and BLE pushing out into extension in addition to hips. initially bending BLE to 90 degrees and blocking out prior to transfer. use of pad to assist and "scoop" patient to chair. Question Pt's understanding of what was going on because he extended during the transfer and did not assist. Once in the chair he reported feeling "much better"      Balance Overall balance assessment: Needs assistance Sitting-balance support: Single extremity supported, Feet supported Sitting balance-Leahy Scale: Poor Sitting balance - Comments: Pt leans posteriorly, needing modA  - unable to progress to min A like previous sessions now with right lateral lean - but NOT pushing Postural control: Posterior lean, Right lateral lean Standing balance support: Bilateral upper  extremity supported Standing balance-Leahy Scale: Zero Standing balance comment: Total A +2 and bil knees blocked with bil HHA - pt attempts to push into hip  extension with positional change                           ADL either performed or assessed with clinical judgement   ADL Overall ADL's : Needs assistance/impaired Eating/Feeding: NPO (CORE track)   Grooming: Moderate assistance;Wash/dry face;Sitting;Maximal assistance Grooming Details (indicate cue type and reason): struggles with Bil hand tasks Upper Body Bathing: Moderate assistance   Lower Body Bathing: Maximal assistance   Upper Body Dressing : Maximal assistance   Lower Body Dressing: Total assistance   Toilet Transfer: Total assistance;+2 for safety/equipment;+2 for physical assistance;Squat-pivot Toilet Transfer Details (indicate cue type and reason): 2 person face to face - see transfer info below Toileting- Clothing Manipulation and Hygiene: Total assistance;Bed level Toileting - Clothing Manipulation Details (indicate cue type and reason): foley, rolling for rear peri care at total A     Functional mobility during ADLs: Total assistance;+2 for physical assistance;+2 for safety/equipment (squat pivot transfer with blocking BLE) General ADL Comments: limited by cognition, decreased balance, pain in [name], vision     Vision Baseline Vision/History: 1 Wears glasses Ability to See in Adequate Light: 2 Moderately impaired Patient Visual Report: Other (comment) (Pt unable to state at this time) Vision Assessment?: Vision impaired- to be further tested in functional context Additional Comments: Pt not tracking or maintaining  eye contact. Impaired by cognition/hearing, and will benefit from continued assessment. Delayed processing     Perception         Praxis         Pertinent Vitals/Pain Pain Assessment Pain Assessment: Faces Faces Pain Scale: Hurts whole lot Pain Location: L arm/wrist Pain Descriptors / Indicators: Discomfort, Grimacing, Tender, Sore Pain Intervention(s): Monitored during session, Repositioned (elevated, made RN aware)      Extremity/Trunk Assessment Upper Extremity Assessment Upper Extremity Assessment: Right hand dominant;[name] deficits/detail;Difficult to assess due to impaired cognition [name] Deficits / Details: concern for IV infiltration as there seemed to be accumulated liquid/edema under the skin by IV site. R hand/wrist very tender to touch and movement - Pt shied away from functional use of [name] [name]: Unable to fully assess due to pain [name] Coordination: decreased fine motor;decreased gross motor   Lower Extremity Assessment Lower Extremity Assessment: Defer to PT evaluation       Communication Communication Communication: Hearing impairment (daughter plans to bring in hearing aides in the future) Cueing Techniques: Verbal cues;Gestural cues;Tactile cues   Cognition Arousal: Alert Behavior During Therapy: Flat affect Overall Cognitive Status: Impaired/Different from baseline Area of Impairment: Memory, Attention, Following commands, Safety/judgement, Awareness, Problem solving, Rancho level               Rancho Levels of Cognitive Functioning Rancho Los Amigos Scales of Cognitive Functioning: Confused, Inappropriate Non-Agitated: Maximal Assistance   Current Attention Level: Focused Memory: Decreased short-term memory Following Commands: Follows one step commands inconsistently, Follows one step commands with increased time (approx 10 second delay) Safety/Judgement: Decreased awareness of safety, Decreased awareness of deficits Awareness: Intellectual Problem Solving: Slow processing, Decreased initiation, Difficulty sequencing, Requires verbal cues, Requires tactile cues General Comments: more awake this session, stated his name "Kip" unable to articulate location of pain, but when palpating correctly identifying area with yes/no response. Eye contact still remains inconsistent. Pt perseverating on words/phrases "get up get up get up"  he had about a 10-20 second delay for simple  questions. Rancho Mirant Scales of Cognitive Functioning: Confused, Inappropriate Non-Agitated: Maximal Assistance [V]   General Comments  Max HR of 160 during session. BP 127/85 in supine, 92/78 sitting in recliner, 113/66 sitting in recliner with legs down    Exercises     Shoulder Instructions      Home Living Family/patient expects to be discharged to:: Private residence Living Arrangements: Spouse/significant other Available Help at Discharge: Family;Available 24 hours/day (wife unable to assist much though as she has had multiple CVAs and uses a RW) Type of Home: House Home Access: Stairs to enter [employer] of Steps: 2 Entrance Stairs-Rails: Right Home Layout: One level     Bathroom Shower/Tub: Producer, television/film/video: Standard     Home Equipment: Hand held shower head;Shower seat;Toilet riser;BSC/3in1   Additional Comments: has 2 goats and a small dog "Dia Sitter" enjoys being outdoors      Prior Functioning/Environment Prior Level of Function : Independent/Modified Independent             Mobility Comments: No AD majority of time, uses walking stick intermittently outside; no other falls other than the x3 falls a few days prior to admission ADLs Comments: Wife uses RW and has had multiple CVAs so pt does majority of household chores; has farm with farm animals, like goats and farm cats; family provides meals for pt and wife        OT Problem List: Decreased strength;Decreased range of motion;Decreased activity tolerance;Impaired balance (sitting and/or standing);Impaired vision/perception;Decreased cognition;Decreased coordination;Decreased safety awareness;Decreased knowledge of use of DME or AE;Decreased knowledge of precautions;Impaired UE functional use;Pain      OT Treatment/Interventions: Self-care/ADL training;Neuromuscular education;DME and/or AE instruction;Therapeutic activities;Cognitive remediation/compensation;Visual/perceptual  remediation/compensation;Patient/family education;Balance training    OT Goals(Current goals can be found in the care plan section) Acute Rehab OT Goals Patient Stated Goal: get up OT Goal Formulation: With patient/family Time For Goal Achievement: 01/06/23 Potential to Achieve Goals: Good ADL Goals Pt Will Perform Grooming: with contact guard assist;sitting Pt Will Perform Upper Body Bathing: with min assist;sitting Pt Will Perform Lower Body Bathing: with min assist;sitting/lateral leans Pt Will Transfer to Toilet: with mod assist;squat pivot transfer Pt Will Perform Toileting - Clothing Manipulation and hygiene: with mod assist;sitting/lateral leans Additional ADL Goal #1: Pt will make eye contact with speaker without cues 75% of the time Additional ADL Goal #2: Pt will maintain unsupported seated balance at min guard level for approx 5 min in preparation for ADL participation  OT Frequency: Min 1X/week    Co-evaluation PT/OT/SLP Co-Evaluation/Treatment: Yes Reason for Co-Treatment: Necessary to address cognition/behavior during functional activity;For patient/therapist safety;To address functional/ADL transfers PT goals addressed during session: Mobility/safety with mobility;Balance;Strengthening/ROM OT goals addressed during session: ADL's and self-care;Strengthening/ROM      AM-PAC OT "6 Clicks" Daily Activity     Outcome Measure Help from another person eating meals?: Total (NPO) Help from another person taking care of personal grooming?: A Lot Help from another person toileting, which includes using toliet, bedpan, or urinal?: Total Help from another person bathing (including washing, rinsing, drying)?: A Lot Help from another person to put on and taking off regular upper body clothing?: A Lot Help from another person to put on and taking off regular lower body clothing?: Total 6 Click Score: 9   End of Session Equipment Utilized During Treatment: Gait belt Nurse  Communication: Mobility status;Need for lift equipment;Other (comment) (concern over IV infiltrated)  Activity Tolerance: Patient tolerated treatment well Patient left: in chair;with call bell/phone within reach;with chair alarm set;with family/visitor present;with restraints reapplied  OT Visit Diagnosis: Unsteadiness on feet (R26.81);Other symptoms and signs involving the nervous system (R29.898);Other symptoms and signs involving cognitive function                Time: 1914-7829 OT Time Calculation (min): 33 min Charges:  OT General Charges $OT Visit: 1 Visit OT Evaluation $OT Eval Moderate Complexity: 1 Mod  Nyoka Cowden OTR/L Acute Rehabilitation Services Office: [phone]  Evern Bio [facility] 12/23/2022, 11:32 AM

## 2022-12-23 NOTE — Progress Notes (Addendum)
Palliative Medicine Inpatient Follow Up Note HPI: James Schettino Sr. is a 89 y.o. male with past medical history of Nonsustained V. tach, A-fib, ischemic cardiomyopathy (HFrEF with improved EF 35% improved to 55% 2022) HTN, COPD, hypothyroidism, chronic anemia, frequent falls. S/P traumatic subarachnoid hemorrhage. Palliative care has been asked to get involved to support additional goals of care conversations.   Today's Discussion 12/23/2022  *Please note that this is a verbal dictation therefore any spelling or grammatical errors are due to the "Dragon [employer] One" system interpretation.  Chart reviewed inclusive of vital signs, progress notes, laboratory results, and diagnostic images.   I spoke to NT this morning who shares that [name] had slept well throughout the evening hours.    I met at bedside this morning with [name], his daughter, [name], and RN, [name]. [name] is pleasant this morning. He is more alert though remains disoriented.  I was able to speak with [name] regarding his goals further. We discussed the importance of daily mobility. We discussed the need for speech to see him again and hopefully to better identify if he will be safe to eat/drink or not.   Ideally, [name] will mentally clear to the point whereby he can help guide the healthcare team regarding future GOC.  We discussed the idea of rehabilitation, differences as below:  Acute rehab is intense rehab for patients who have experienced a major medical trauma and need serious efforts to aid in recovery. Some patients may have had a stroke, just come out of major surgery, had an amputation, or may still be dealing with a serious illness. Personalized multidisciplinary therapy scheduled throughout the day on average for three to four hours, five to seven days a week, to improve function and independence.   Skilled care is nursing and therapy care that can only be safely and effectively performed by, or under the  supervision of, professionals or Engineer, maintenance. It's health care given when you need skilled nursing or skilled therapy to treat, manage, and observe your condition, and evaluate your care. In a skilled nursing facility you'll receive one or more therapies for an average of one to two hours per day five days a week. This includes physical, occupational, and speech therapy. The therapies are not considered intensive.   Plan to continue allowing time for outcomes.   Questions and concerns addressed/Palliative Support Provided.   Objective Assessment: Vital Signs Vitals:   12/23/22 0254 12/23/22 0739  BP: 110/89 (!) 141/95  Pulse: 91 (!) 108  Resp: 20 19  Temp: 99.1 F (37.3 C) 98.4 F (36.9 C)  SpO2: 97% 91%    Intake/Output Summary (Last 24 hours) at 12/23/2022 0981 Last data filed at 12/23/2022 1914 Gross per 24 hour  Intake 485 ml  Output 3250 ml  Net -2765 ml   Last Weight  Most recent update: 12/23/2022  5:21 AM    Weight  58.5 kg (128 lb 15.5 oz)            Gen:  Frail elderly Caucasian M in NAD HEENT: moist mucous membranes CV: Irregular rate and rhythm  PULM: On 2LPM [place], breathing is even and non-labored ABD: soft/nontender  EXT: No edema  Neuro: Somenolent  SUMMARY OF RECOMMENDATIONS   Full code --> Has a "Hard Choices" book to review. Ideally patient to clear enough to guide medical team for the future   Goals at this time are to allow time for outcomes   Ongoing PMT support  Symptom Management:  Delirium: Non-pharmacologic Delirium Precautions  - Frequent re-orientation; update board w/ date, names of treatment team  - Daytime stimulation: Open curtains, turn lights on, and turn on television as tolerated during waking hours  - Nighttime calm - close curtains, turn lights off, and turn off television at 9pm  with minimal interruptions from treatment team during sleeping hours, including avoiding lab draws if possible  - Encourage continued presence  of family/friends  - Occupy w/ distractions - e.g. ask them to fold washcloths, busy-board  - Encourage movement with PT/OT as tolerated  - Ensure adequate sensorium, to include providing glasses, dentures, and hearing aids to patient as appropriate  - Ensure adequate nutrition and fluid intake  - Monitor for regular bowel movements  Billing based on MDM: High ______________________________________________________________________________________ Lamarr Lulas [place] [facility] Medicine Team Team Cell Phone: [phone] Please utilize secure chat with additional questions, if there is no response within 30 minutes please call the above phone number  Palliative Medicine Team providers are available by phone from 7am to 7pm daily and can be reached through the team cell phone.  Should this patient require assistance outside of these hours, please call the patient's attending physician.

## 2022-12-23 NOTE — Progress Notes (Signed)
Physical Therapy Treatment Patient Details Name: James Byram Sr. MRN: [id number] DOB: [birth year] Today's Date: 12/23/2022   History of Present Illness Pt is an 89 y.o. male who presented 12/15/22 s/p fall with head trauma. CTH shows diffuse scattered traumatic pattern subarachnoid hemorrhage. Cortrak placed 8/12. PMHx includes COPD, prior NSTEMI / CHF, NSVT, PAF, anemia, bradycardia, senile osteoporosis, CAD, HLD, HTN    PT Comments  Pt was much more alert and participating in conversation today. However, he continues to display deficits in awareness and command following. He is currently presenting at a [place] level V, confused, inappropriate, non-agitated. He demonstrated a tendency to lean to his R and posteriorly and actively extend his hips and knees when sitting EOB, needing increased assistance for static sitting balance and total assist x2 with extensive positioning to flex his knees and block them to allow him to stand and squat pivot from EOB to recliner today. Will continue to follow acutely.      If plan is discharge home, recommend the following: Two people to help with walking and/or transfers;A lot of help with bathing/dressing/bathroom;Assistance with cooking/housework;Direct supervision/assist for medications management;Direct supervision/assist for financial management;Assist for transportation;Help with stairs or ramp for entrance   Can travel by private vehicle        Equipment Recommendations  Other (comment) (TBA)    Recommendations for Other Services       Precautions / Restrictions Precautions Precautions: Fall;Other (comment) Precaution Comments: cortrak; bil mitts and wrist restraints Restrictions Weight Bearing Restrictions: No     Mobility  Bed Mobility Overal bed mobility: Needs Assistance Bed Mobility: Supine to Sit     Supine to sit: Max assist, +2 for physical assistance, +2 for safety/equipment, HOB elevated     General bed mobility  comments: max A +2 with max multimodal cues for legs off the bed, pushing up into sitting, and seated balance. use of pad to bring hips EOB. Strong posterior and right lean in sitting    Transfers Overall transfer level: Needs assistance Equipment used: 2 person hand held assist Transfers: Bed to chair/wheelchair/BSC, Sit to/from Stand Sit to Stand: Total assist, +2 physical assistance, +2 safety/equipment, From elevated surface     Squat pivot transfers: Total assist, +2 physical assistance, +2 safety/equipment, From elevated surface     General transfer comment: Pt with strong posterior bias, and BLE pushing out into extension in addition to hips. initially bending BLE to 90 degrees and blocking out prior to transfer. use of pad to assist and "scoop" patient to chair. Question Pt's understanding of what was going on because he extended during the transfer and did not assist. Once in the chair he reported feeling "much better"    Ambulation/Gait               General Gait Details: unable at this time   Stairs             Wheelchair Mobility     Tilt Bed    Modified Rankin (Stroke Patients Only)       Balance Overall balance assessment: Needs assistance Sitting-balance support: Single extremity supported, Feet supported Sitting balance-Leahy Scale: Poor Sitting balance - Comments: Pt leans posteriorly, needing modA  - unable to progress to min A like previous sessions now with right lateral lean - but NOT pushing Postural control: Posterior lean, Right lateral lean Standing balance support: Bilateral upper extremity supported Standing balance-Leahy Scale: Zero Standing balance comment: Total A +2 and bil knees blocked with bil  HHA - pt attempts to push into hip extension with positional change                            Cognition Arousal: Alert Behavior During Therapy: Flat affect Overall Cognitive Status: Impaired/Different from baseline Area of  Impairment: Memory, Attention, Following commands, Safety/judgement, Awareness, Problem solving, Rancho level               Rancho Levels of Cognitive Functioning [place]igos Scales of Cognitive Functioning: Confused, Inappropriate Non-Agitated: Maximal Assistance   Current Attention Level: Focused Memory: Decreased short-term memory Following Commands: Follows one step commands inconsistently, Follows one step commands with increased time (approx 10 second delay) Safety/Judgement: Decreased awareness of safety, Decreased awareness of deficits Awareness: Intellectual Problem Solving: Slow processing, Decreased initiation, Difficulty sequencing, Requires verbal cues, Requires tactile cues General Comments: more awake this session, stated his name "[name]" unable to articulate location of pain, but when palpating correctly identifying area with yes/no response. Eye contact still remains inconsistent. Pt perseverating on words/phrases "get up get up get up" and parroting therapists and daughter. He had about a 10-20 second delay for simple questions. No agitation today   [email or web address] Scales of Cognitive Functioning: Confused, Inappropriate Non-Agitated: Maximal Assistance [V]    Exercises      General Comments General comments (skin integrity, edema, etc.): Max HR of 160 during session. BP 127/85 in supine, 92/78 sitting in recliner initially after transfer, 113/66 sitting in recliner several minutes with legs down      Pertinent Vitals/Pain Pain Assessment Pain Assessment: Faces Faces Pain Scale: Hurts whole lot Pain Location: L arm/wrist Pain Descriptors / Indicators: Discomfort, Grimacing, Tender, Sore Pain Intervention(s): Limited activity within patient's tolerance, Monitored during session, Repositioned, Other (comment) (notified RN of concern for potential IV infiltration)    Home Living                          Prior Function            PT Goals  (current goals can now be found in the care plan section) Acute Rehab PT Goals Patient Stated Goal: to sit up; daughter hopes for pt to improve PT Goal Formulation: With patient/family Time For Goal Achievement: 01/05/23 Potential to Achieve Goals: Good Progress towards PT goals: Progressing toward goals    Frequency    Min 1X/week      PT Plan      Co-evaluation PT/OT/SLP Co-Evaluation/Treatment: Yes Reason for Co-Treatment: Necessary to address cognition/behavior during functional activity;For patient/therapist safety;To address functional/ADL transfers PT goals addressed during session: Mobility/safety with mobility;Balance;Strengthening/ROM        AM-PAC PT "6 Clicks" Mobility   Outcome Measure  Help needed turning from your back to your side while in a flat bed without using bedrails?: Total Help needed moving from lying on your back to sitting on the side of a flat bed without using bedrails?: Total Help needed moving to and from a bed to a chair (including a wheelchair)?: Total Help needed standing up from a chair using your arms (e.g., wheelchair or bedside chair)?: Total Help needed to walk in hospital room?: Total Help needed climbing 3-5 steps with a railing? : Total 6 Click Score: 6    End of Session Equipment Utilized During Treatment: Gait belt Activity Tolerance: Patient tolerated treatment well Patient left: in chair;with call bell/phone within reach;with chair alarm set;with family/visitor  present;with restraints reapplied Nurse Communication: Mobility status;Need for lift equipment PT Visit Diagnosis: Unsteadiness on feet (R26.81);Other abnormalities of gait and mobility (R26.89);Muscle weakness (generalized) (M62.81);Difficulty in walking, not elsewhere classified (R26.2);Other symptoms and signs involving the nervous system (R29.898)     Time: 2130-8657 PT Time Calculation (min) (ACUTE ONLY): 33 min  Charges:    $Therapeutic Activity: 8-22  mins PT General Charges $$ ACUTE PT VISIT: 1 Visit                     Raymond Gurney, PT, DPT Acute Rehabilitation Services  Office: [phone]    Jewel Baize 12/23/2022, 6:17 PM

## 2022-12-23 NOTE — Plan of Care (Signed)

## 2022-12-23 NOTE — Progress Notes (Signed)
[facility] PROGRESS NOTE  James Bertot Sr. (DOB: 16-Apr-1932) ZOX:[id number] PCP: Blane Ohara, MD  Brief Narrative: James Skillern Sr. is an 89 y.o. male with a history of A-fib, NSVT, ischemic cardiomyopathy (NSTEMI s/p LAD stent 2022), HFrEF (LVEF improved to 55% 2022 from 35%), HTN, COPD, hypothyroidism, chronic anemia, frequent falls who presented 8/8 after a fall found to have a traumatic subarachnoid hemorrhage.   Hospitalization has been complicated by E. coli UTI, asymptomatic runs of AFib and PVCs for which cardiology was consulted, acute urinary retention for which urology was consulted, placed urinary catheter 8/12, dysphagia for which cortrak tube has been placed, and persistent encephalopathy. Palliative care is following.    Subjective: Pt alert, answers questions with questionable veracity. Slept well.   Objective: BP (!) 118/43 (BP Location: Right Arm)   Pulse 90   Temp 99.2 F (37.3 C) (Oral)   Resp 19   Ht 6' (1.829 m)   Wt 58.5 kg   SpO2 92%   BMI 17.49 kg/m   Gen: Elderly frail pale male in no distress HEENT: Cortrak in place in left nare Pulm: Clear, nonlabored  CV: Irreg irreg GI: Soft, NT, ND, +BS Neuro: Alert but not oriented, moves all extremities. Ext: Warm, no deformities. Mittens in place, wrist restraints in place without abrasions Skin: Right supraorbital laceration is healing, ecchymosis to right scalp stable from daughter's report.   Assessment & Plan: Traumatic subarachnoid hemorrhage: - Continuing observation per neurosurgery. Plan to repeat CT head in a couple weeks per their notes. Last CT 8/13 mentions vasogenic edema, and expected redistribution of contusions. ?of mass though wasn't present previously. Pt cannot tolerate MRI (even CT was motion degraded), so will defer further imaging at this time.  TBI with acute encephalopathy:  - Optimize medical and nutritional status - He did not have significant delirium during  hospitalization in 2022.  - Palliative following, we are giving time for outcomes.  - Continue restraints if needed to maintain patient safety.   Severe protein calorie malnutrition: Body mass index is 17.49 kg/m.  - Feeding thru cor trak. Keep NPO per SLP evaluation.    E. coli UTI: Resistant to ancef. Leukocytosis worsening, temperature curve trending higher as well without overt fever (axillary measurements).  - Given 1st generation cephalosporin resistance, it's possible that we need to change antibiotics. Will discuss with ID.    Acute urinary retention, BPH, prostate CA s/p brachytherapy: - Urology consulted, placed catheter 8/12 over a wire. Will continue catheter at least 7 days. We're seeing some postobstructive diuresis, supporting this with IVF.       PAF w/RVR, frequent PVCs, hx NSVT: Currently in rate-controlled AFib. - Cardiology consulted - Continue metoprolol 25mg  BID and 5mg  IV prn  - Holding anticoagulation due to fall risk and current SAH, SDH. Not a good candidate for Watchman device.   CAD s/p LAD stent 2022: elevated troponin is mild at 32, flat, without angina consistent with demand myocardial ischemia.  - Continue beta blocker, statin. Not on antiplatelet due to bleeding.   Chronic HFrEF with improved EF (35>55%, 2022): - Continue metoprolol.   - Remains euvolemic, continue supportive hydration (IVF w/D5 NS) and feeds (osmolite 1.2)   Hypokalemia: Resolved.  - Continue monitoring.     Hypophosphatemia: Resolved with supplementation.   Chronic normocytic anemia: No bleeding, urine without gross hematuria. Deferred GI work up PTA due to frailty.  - Monitor intermittently, currently stable   Moderate persistent asthma, uncomplicated: No exacerbation. -  Continue DuoNebs.   Acquired hypothyroidism: TSH is 6.177, free T4 is normal.  - Continue levothyroxine 175 mcg daily   Essential hypertension - Continue metoprolol   Debility, deconditioning, frailty: At  current level, would require SNF level of care. Continue mobilizing as able.   Goals of care.  Patient with guarded prognosis.  Palliative care on board.  Plan is to continue current level of care at this time.  Tyrone Nine, MD [facility] [email or web address] 12/23/2022, 12:13 PM

## 2022-12-23 NOTE — Progress Notes (Signed)
Speech Language Pathology Treatment: Dysphagia;Cognitive-Linquistic  Patient Details Name: James Fallo Sr. MRN: [id number] DOB: Apr 23, 1932 Today's Date: 12/23/2022 Time: 2130-8657 SLP Time Calculation (min) (ACUTE ONLY): 24 min  Assessment / Plan / Recommendation Clinical Impression  Pt fatigued after sitting upright in chair for >1 hour, sleeping upon SLP arrival in bed. Initially roused to voice, however unable to sustain level of alertness appropriate for PO trials or to participate in functional self-care activities. SLP removed restraints with close supervision. Pt unable to follow commands to unclench hand from a fist position to hold washcloth or toothette sponge despite Max cueing. Provided thorough oral care, removing dried secretions and skin from his lips and lingual surface. Daughter reports removal of top set of partial dentures yesterday. Pt's eyes remained closed throughout oral care, although roused minimally when washing face with cool washcloth. Overall, pt appears lethargic after stimulation during therapy and being positioned upright in chair. He is not adequately alert to trial any POs today, although suspect with more consistent and increased stimulation, pt could participate in instrumental swallow study soon. Will continue to follow to target cognitive goals and assess readiness for MBS.    HPI HPI: Pt is an 89 y.o. male who presented with AMS after a fall. CT head: Scattered areas of subarachnoid hemorrhage in the left occipital  lobe, bilateral high frontal lobes, and right temporal lobe. Small amount of subarachnoid hemorrhage posterior to the right midbrain extending into the cerebral aqueduct. PMH: COPD, prior NSTEMI / CHF, NSVT, PAF, on ASA81.      SLP Plan  Continue with current plan of care      Recommendations for follow up therapy are one component of a multi-disciplinary discharge planning process, led by the attending physician.  Recommendations may be  updated based on patient status, additional functional criteria and insurance authorization.    Recommendations  Diet recommendations: NPO Medication Administration: Via alternative means                  Oral care QID;Staff/trained caregiver to provide oral care   Frequent or constant Supervision/Assistance Dysphagia, unspecified (R13.10)     Continue with current plan of care     [name], M.A., CF-SLP Speech Language Pathology, Acute Rehabilitation Services  Secure Chat preferred [phone]   12/23/2022, 2:33 PM

## 2022-12-23 NOTE — Care Management Important Message (Signed)
Important Message  Patient Details  Name: James Faggart Sr. MRN: [id number] Date of Birth: November 21, 1931   Medicare Important Message Given:  Yes     [name] 12/23/2022, 2:30 PM

## 2022-12-23 NOTE — Progress Notes (Signed)
Inpatient Rehab Admissions Coordinator:   Per therapy recommendations, patient was screened for CIR candidacy by Megan Salon, MS, CCC-SLP. At this time, Pt. is not at a level to tolerate the intensity of CIR. However,  Pt. may have potential to progress to becoming a potential CIR candidate, so CIR admissions team will follow and monitor for progress and participation with therapies and place consult order if Pt. appears to be an appropriate candidate. Please contact me with any questions.   Megan Salon, MS, CCC-SLP Rehab Admissions Coordinator  [phone] (celll) [phone] (office)

## 2022-12-24 DIAGNOSIS — I609 Nontraumatic subarachnoid hemorrhage, unspecified: Secondary | ICD-10-CM | POA: Diagnosis not present

## 2022-12-24 LAB — GLUCOSE, CAPILLARY
Glucose-Capillary: 129 mg/dL — ABNORMAL HIGH (ref 70–99)
Glucose-Capillary: 123 mg/dL — ABNORMAL HIGH (ref 70–99)
Glucose-Capillary: 136 mg/dL — ABNORMAL HIGH (ref 70–99)
Glucose-Capillary: 152 mg/dL — ABNORMAL HIGH (ref 70–99)
Glucose-Capillary: 151 mg/dL — ABNORMAL HIGH (ref 70–99)

## 2022-12-24 LAB — BASIC METABOLIC PANEL
Anion gap: 10 (ref 5–15)
BUN: 13 mg/dL (ref 8–23)
CO2: 23 mmol/L (ref 22–32)
Calcium: 8.1 mg/dL — ABNORMAL LOW (ref 8.9–10.3)
Chloride: 99 mmol/L (ref 98–111)
Creatinine, Ser: 0.54 mg/dL — ABNORMAL LOW (ref 0.61–1.24)
GFR, Estimated: >60 mL/min (ref >60)
Glucose, Bld: 127 mg/dL — ABNORMAL HIGH (ref 70–99)
Potassium: 3.8 mmol/L (ref 3.5–5.1)
Sodium: 132 mmol/L — ABNORMAL LOW (ref 135–145)

## 2022-12-24 LAB — CBC
HCT: 32 % — ABNORMAL LOW (ref 39.0–52.0)
Hemoglobin: 10.5 g/dL — ABNORMAL LOW (ref 13.0–17.0)
MCH: 31.2 pg (ref 26.0–34.0)
MCHC: 32.8 g/dL (ref 30.0–36.0)
MCV: 95 fL (ref 80.0–100.0)
Platelets: 231 10*3/uL (ref 150–400)
RBC: 3.37 MIL/uL — ABNORMAL LOW (ref 4.22–5.81)
RDW: 12.4 % (ref 11.5–15.5)
WBC: 18.3 10*3/uL — ABNORMAL HIGH (ref 4.0–10.5)
nRBC: 0 % (ref 0.0–0.2)

## 2022-12-24 LAB — PHOSPHORUS: Phosphorus: 3 mg/dL (ref 2.5–4.6)

## 2022-12-24 LAB — MAGNESIUM: Magnesium: 1.9 mg/dL (ref 1.7–2.4)

## 2022-12-24 MED ORDER — FREE WATER
150.0000 mL | Freq: Four times a day (QID) | Status: DC
  Administered 2022-12-24 – 2022-12-26 (×9): 150 mL

## 2022-12-24 NOTE — Plan of Care (Signed)

## 2022-12-24 NOTE — Progress Notes (Signed)
Pt has been drowsy/sleepy most of the day. Daughter, Marcelino Duster, visited this morning. Pt was trialed out of restraints at 0830 and discontinued at 0930. Mittens remain in place due to cortrak and iv.

## 2022-12-24 NOTE — Progress Notes (Signed)
[facility] PROGRESS NOTE  James Sanangelo Sr. (DOB: 1931-07-06) VZD:[id number] PCP: Blane Ohara, MD  Brief Narrative: James Farrand Sr. is an 89 y.o. male with a history of A-fib, NSVT, ischemic cardiomyopathy (NSTEMI s/p LAD stent 2022), HFrEF (LVEF improved to 55% 2022 from 35%), HTN, COPD, hypothyroidism, chronic anemia, frequent falls who presented 8/8 after a fall found to have a traumatic subarachnoid hemorrhage.   Hospitalization has been complicated by E. coli UTI, asymptomatic runs of AFib and PVCs for which cardiology was consulted, acute urinary retention for which urology was consulted, placed urinary catheter 8/12, dysphagia for which cortrak tube has been placed, and persistent encephalopathy. Palliative care is following.    Subjective: Sleeping soundly, when awoken is without complaints.   Objective: BP 127/89 (BP Location: Left Arm)   Pulse 91   Temp 98.3 F (36.8 C) (Axillary)   Resp 20   Ht 6' (1.829 m)   Wt 58.5 kg   SpO2 97%   BMI 17.49 kg/m   Gen: Elderly, frail male in no distress HEENT: Cortrak left nare. Poor dentition. Pulm: Clear, nonlabored  CV: NSR with frequent PACs and PVCs on monitor. No MRG, minimal pitting edema GI: Soft, NT, ND, +BS  Neuro: Rousable, not oriented. No new focal deficits. Ext: Warm, no deformities, restraints in place.  Skin: No new rashes, lesions or ulcers on visualized skin   Assessment & Plan: Traumatic subarachnoid hemorrhage: - Continuing observation per neurosurgery. Plan to repeat CT head in a couple weeks per their notes. Last CT 8/13 mentions vasogenic edema, and expected redistribution of contusions. ?of mass though wasn't present previously. Pt cannot tolerate MRI (even CT was motion degraded), so will defer further imaging at this time.  TBI with acute encephalopathy:  - Optimize medical and nutritional status - He did not have significant delirium during hospitalization in 2022.  - Palliative  following, we are giving time for outcomes.  - Continue restraints if needed to maintain patient safety.   Severe protein calorie malnutrition: Body mass index is 17.49 kg/m.  - Feeding thru cor trak. Keep NPO per SLP evaluation.    E. coli UTI: Resistant to ancef. Leukocytosis stable.  - Continue ceftriaxone given indwelling foley and continued leukocytosis. D/w ID. Would not transition to oral 3rd gen cephalosporin based on susceptibilities, but ceftriaxone IV should be effective.   Acute urinary retention, BPH, prostate CA s/p brachytherapy: - Urology consulted, placed catheter 8/12 over a wire. Will continue catheter at least 7 days. We're seeing some postobstructive diuresis, may be trailing off now.    PAF w/RVR, frequent PVCs, hx NSVT: Currently in sinus rhythm with PACs, PVCs, rate overall normal. - Cardiology consulted - Continue metoprolol 25mg  BID and 5mg  IV prn  - Holding anticoagulation due to fall risk and current SAH, SDH. Not a good candidate for Watchman device.   CAD s/p LAD stent 2022: elevated troponin is mild at 32, flat, without angina consistent with demand myocardial ischemia.  - Continue beta blocker, statin. Not on antiplatelet due to bleeding.   Chronic HFrEF with improved EF (35>55%, 2022): - Continue metoprolol.   - Remains euvolemic, continue osmolite 1.2 for full nutritional support, change IVF to free water q6h.   Hypokalemia: Resolved.  - Continue monitoring.     Hypophosphatemia: Resolved with supplementation.   Chronic normocytic anemia: No bleeding, urine without gross hematuria. Deferred GI work up PTA due to frailty.  - Monitor intermittently, currently stable   Moderate persistent asthma, uncomplicated:  No exacerbation. - Continue DuoNebs.   Acquired hypothyroidism: TSH is 6.177, free T4 is normal.  - Continue levothyroxine 175 mcg daily   Essential hypertension - Continue metoprolol   Debility, deconditioning, frailty: At current  level, would require SNF level of care. Continue mobilizing as able.   Goals of care.  Patient with guarded prognosis.  Palliative care on board.  Plan is to continue current level of care at this time.  Tyrone Nine, MD [facility] [email or web address] 12/24/2022, 7:33 AM

## 2022-12-25 DIAGNOSIS — Z515 Encounter for palliative care: Secondary | ICD-10-CM | POA: Diagnosis not present

## 2022-12-25 DIAGNOSIS — Z7189 Other specified counseling: Secondary | ICD-10-CM | POA: Diagnosis not present

## 2022-12-25 LAB — GLUCOSE, CAPILLARY
Glucose-Capillary: 147 mg/dL — ABNORMAL HIGH (ref 70–99)
Glucose-Capillary: 147 mg/dL — ABNORMAL HIGH (ref 70–99)
Glucose-Capillary: 149 mg/dL — ABNORMAL HIGH (ref 70–99)
Glucose-Capillary: 118 mg/dL — ABNORMAL HIGH (ref 70–99)
Glucose-Capillary: 122 mg/dL — ABNORMAL HIGH (ref 70–99)
Glucose-Capillary: 138 mg/dL — ABNORMAL HIGH (ref 70–99)

## 2022-12-25 MED ORDER — MAGNESIUM SULFATE IN D5W 1-5 GM/100ML-% IV SOLN
1.0000 g | Freq: Once | INTRAVENOUS | Status: AC
  Administered 2022-12-25: 1 g via INTRAVENOUS
  Filled 2022-12-25: qty 100

## 2022-12-25 NOTE — Progress Notes (Signed)
[facility] PROGRESS NOTE  James Thiery Sr. (DOB: 1932-02-28) JXB:[id number] PCP: Blane Ohara, MD  Brief Narrative: James Hanneman Sr. is an 89 y.o. male with a history of A-fib, NSVT, ischemic cardiomyopathy (NSTEMI s/p LAD stent 2022), HFrEF (LVEF improved to 55% 2022 from 35%), HTN, COPD, hypothyroidism, chronic anemia, frequent falls who presented 8/8 after a fall found to have a traumatic subarachnoid hemorrhage.   Hospitalization has been complicated by E. coli UTI, asymptomatic runs of AFib and PVCs for which cardiology was consulted, acute urinary retention for which urology was consulted, placed urinary catheter 8/12, dysphagia for which cortrak tube has been placed, and persistent encephalopathy. Palliative care is following.    Subjective: Sleeping soundly, awakens and answers at times with repeating what is asked and with repeating "yea" but not appropriately.   Objective: BP 127/76 (BP Location: Right Arm)   Pulse (!) 107   Temp 100.2 F (37.9 C) (Axillary)   Resp 19   Ht 6' (1.829 m)   Wt 58.5 kg   SpO2 97%   BMI 17.49 kg/m   Gen: Elderly, frail male in no distress Pulm: Clear, nonlabored  CV: Regular tachycardia, no edema GI: Soft, NT, ND, +BS Neuro: Resting, rouses to sound and touch, cannot follow commands, moves all extremities. Ext: Warm, no deformities. Skin: No new rashes, lesions or ulcers on visualized skin   Assessment & Plan: Traumatic subarachnoid hemorrhage: - Continuing observation per neurosurgery. Plan to repeat CT head in a couple weeks per their notes. Last CT 8/13 mentions vasogenic edema, and expected redistribution of contusions. ?of mass though wasn't present previously. Pt cannot tolerate MRI (even CT was motion degraded), so will defer further imaging at this time.  TBI with acute encephalopathy:  - Optimize medical and nutritional status - He did not have significant delirium during hospitalization in 2022.  - Palliative  following, we are giving time for outcomes. No meaningful improvement noted at this time.  - Continue mittens to protect apparatuses. Wrist restraints thankfully are able to be stopped.  Severe protein calorie malnutrition: Body mass index is 17.49 kg/m.  - Feeding thru cor trak. Keep NPO per SLP evaluation.    E. coli UTI: Resistant to ancef. Leukocytosis stable.  - Continue ceftriaxone given indwelling foley and continued leukocytosis. D/w ID. Would not transition to oral 3rd gen cephalosporin based on susceptibilities, but ceftriaxone IV should be effective. Will recheck CBC in AM. Having low grade temperature but remains afebrile, which we'll monitor closely.   Acute urinary retention, BPH, prostate CA s/p brachytherapy: - Urology consulted, placed catheter 8/12 over a wire. Will continue catheter at least 7 days.   PAF w/RVR, frequent PVCs, hx NSVT: Currently in sinus rhythm with PACs, PVCs, rate overall normal. - Cardiology consulted - Continue metoprolol 25mg  BID and 5mg  IV prn  - Holding anticoagulation due to fall risk and current SAH, SDH. Not a good candidate for Watchman device.   CAD s/p LAD stent 2022: elevated troponin is mild at 32, flat, without angina consistent with demand myocardial ischemia.  - Continue beta blocker, statin. Not on antiplatelet due to bleeding.   Chronic HFrEF with improved EF (35>55%, 2022): - Continue metoprolol.   - Remains euvolemic, continue osmolite 1.2 for full nutritional support, continue free water q6h.   Hypokalemia: Resolved.  - Continue monitoring in AM   Hypophosphatemia: Resolved with supplementation.   Chronic normocytic anemia: No bleeding, urine without gross hematuria. Deferred GI work up PTA due to  frailty.  - Monitor intermittently, currently stable   Moderate persistent asthma, uncomplicated: No exacerbation. - Continue DuoNebs.   Acquired hypothyroidism: TSH is 6.177, free T4 is normal.  - Continue levothyroxine  175 mcg daily   Essential hypertension - Continue metoprolol   Debility, deconditioning, frailty: At current level, would require SNF level of care. Continue mobilizing as able.   Goals of care.  Patient with guarded prognosis.  Palliative care on board.  Plan is to continue current level of care at this time.  Tyrone Nine, MD [facility] [email or web address] 12/25/2022, 10:11 AM

## 2022-12-25 NOTE — Progress Notes (Signed)
   Palliative Medicine Inpatient Follow Up Note  Chart reviewed.  Patient remains encephalopathic.  Coretrack feedings continue - has not been able to advance to a diet per speech note review.  Prognosis remains guarded, though family would like to allow time for outcomes.   Possible additional conversation will circulate around potential long term nutrition options if [name] neglects to improve further as well as overall goals in light of his present situation.   PMT will remain involved to support patient and his family as needed.   No Charge _________________________________________________________ [name] Palliative Medicine Team Team Cell Phone: [phone] Please utilize secure chat with additional questions, if there is no response within 30 minutes please call the above phone number  Palliative Medicine Team providers are available by phone from 7am to 7pm daily and can be reached through the team cell phone.  Should this patient require assistance outside of these hours, please call the patient's attending physician.

## 2022-12-26 ENCOUNTER — Inpatient Hospital Stay (HOSPITAL_COMMUNITY): Payer: Medicare HMO

## 2022-12-26 DIAGNOSIS — I609 Nontraumatic subarachnoid hemorrhage, unspecified: Secondary | ICD-10-CM | POA: Diagnosis not present

## 2022-12-26 DIAGNOSIS — Z7189 Other specified counseling: Secondary | ICD-10-CM | POA: Diagnosis not present

## 2022-12-26 DIAGNOSIS — Z515 Encounter for palliative care: Secondary | ICD-10-CM | POA: Diagnosis not present

## 2022-12-26 LAB — URINALYSIS, ROUTINE W REFLEX MICROSCOPIC
Bilirubin Urine: NEGATIVE
Glucose, UA: NEGATIVE mg/dL
Ketones, ur: NEGATIVE mg/dL
Leukocytes,Ua: NEGATIVE
Nitrite: NEGATIVE
Protein, ur: NEGATIVE mg/dL
Specific Gravity, Urine: 1.003 — ABNORMAL LOW (ref 1.005–1.030)
pH: 7 (ref 5.0–8.0)

## 2022-12-26 LAB — BASIC METABOLIC PANEL
Anion gap: 13 (ref 5–15)
Anion gap: 9 (ref 5–15)
BUN: 15 mg/dL (ref 8–23)
BUN: 18 mg/dL (ref 8–23)
CO2: 22 mmol/L (ref 22–32)
CO2: 23 mmol/L (ref 22–32)
Calcium: 8.1 mg/dL — ABNORMAL LOW (ref 8.9–10.3)
Calcium: 8.2 mg/dL — ABNORMAL LOW (ref 8.9–10.3)
Chloride: 94 mmol/L — ABNORMAL LOW (ref 98–111)
Chloride: 98 mmol/L (ref 98–111)
Creatinine, Ser: 0.72 mg/dL (ref 0.61–1.24)
Creatinine, Ser: 0.75 mg/dL (ref 0.61–1.24)
GFR, Estimated: >60 mL/min (ref >60)
GFR, Estimated: >60 mL/min (ref >60)
Glucose, Bld: 128 mg/dL — ABNORMAL HIGH (ref 70–99)
Glucose, Bld: 141 mg/dL — ABNORMAL HIGH (ref 70–99)
Potassium: 4.4 mmol/L (ref 3.5–5.1)
Potassium: 4.4 mmol/L (ref 3.5–5.1)
Sodium: 129 mmol/L — ABNORMAL LOW (ref 135–145)
Sodium: 130 mmol/L — ABNORMAL LOW (ref 135–145)

## 2022-12-26 LAB — GLUCOSE, CAPILLARY
Glucose-Capillary: 149 mg/dL — ABNORMAL HIGH (ref 70–99)
Glucose-Capillary: 148 mg/dL — ABNORMAL HIGH (ref 70–99)
Glucose-Capillary: 131 mg/dL — ABNORMAL HIGH (ref 70–99)
Glucose-Capillary: 128 mg/dL — ABNORMAL HIGH (ref 70–99)
Glucose-Capillary: 122 mg/dL — ABNORMAL HIGH (ref 70–99)

## 2022-12-26 LAB — CBC
HCT: 32.2 % — ABNORMAL LOW (ref 39.0–52.0)
HCT: 33.8 % — ABNORMAL LOW (ref 39.0–52.0)
Hemoglobin: 10.5 g/dL — ABNORMAL LOW (ref 13.0–17.0)
Hemoglobin: 11 g/dL — ABNORMAL LOW (ref 13.0–17.0)
MCH: 31.1 pg (ref 26.0–34.0)
MCH: 31.6 pg (ref 26.0–34.0)
MCHC: 32.5 g/dL (ref 30.0–36.0)
MCHC: 32.6 g/dL (ref 30.0–36.0)
MCV: 95.3 fL (ref 80.0–100.0)
MCV: 97.1 fL (ref 80.0–100.0)
Platelets: 284 10*3/uL (ref 150–400)
Platelets: 315 10*3/uL (ref 150–400)
RBC: 3.38 MIL/uL — ABNORMAL LOW (ref 4.22–5.81)
RBC: 3.48 MIL/uL — ABNORMAL LOW (ref 4.22–5.81)
RDW: 12.3 % (ref 11.5–15.5)
RDW: 12.4 % (ref 11.5–15.5)
WBC: 16.4 10*3/uL — ABNORMAL HIGH (ref 4.0–10.5)
WBC: 18.8 10*3/uL — ABNORMAL HIGH (ref 4.0–10.5)
nRBC: 0 % (ref 0.0–0.2)
nRBC: 0 % (ref 0.0–0.2)

## 2022-12-26 LAB — PHOSPHORUS: Phosphorus: 2.8 mg/dL (ref 2.5–4.6)

## 2022-12-26 LAB — MAGNESIUM: Magnesium: 2.3 mg/dL (ref 1.7–2.4)

## 2022-12-26 LAB — PROCALCITONIN: Procalcitonin: 0.3 ng/mL

## 2022-12-26 MED ORDER — FREE WATER
100.0000 mL | Freq: Three times a day (TID) | Status: DC
  Administered 2022-12-26 – 2022-12-31 (×15): 100 mL

## 2022-12-26 MED ORDER — METOPROLOL TARTRATE 50 MG PO TABS
50.0000 mg | ORAL_TABLET | Freq: Two times a day (BID) | ORAL | Status: DC
  Administered 2022-12-26 – 2023-01-03 (×17): 50 mg
  Filled 2022-12-26 (×17): qty 1

## 2022-12-26 NOTE — Progress Notes (Addendum)
MEWS Progress Note  Patient Details Name: Roberta Farleigh Sr. MRN: [id number] DOB: 06-04-1931 Today's Date: 12/26/2022   MEWS Flowsheet Documentation:  Assess: MEWS Score Temp: (!) 102.2 F (39 C) BP: 129/84 MAP (mmHg): 98 Pulse Rate: 92 ECG Heart Rate: 97 Resp: (!) 25 Level of Consciousness: Alert SpO2: 98 % O2 Device: Room Air Patient Activity (if Appropriate): In bed O2 Flow Rate (L/min): 2 L/min Assess: MEWS Score MEWS Temp: 2 MEWS Systolic: 0 MEWS Pulse: 0 MEWS RR: 1 MEWS LOC: 0 MEWS Score: 3 MEWS Score Color: Yellow Assess: SIRS CRITERIA SIRS Temperature : 1 SIRS Respirations : 1 SIRS Pulse: 1 SIRS WBC: 0 SIRS Score Sum : 3 SIRS Temperature : 0 SIRS Pulse: 1 SIRS Respirations : 1 SIRS WBC: 0 SIRS Score Sum : 2 Assess: if the MEWS score is Yellow or Red Were vital signs accurate and taken at a resting state?: Yes Does the patient meet 2 or more of the SIRS criteria?: Yes Does the patient have a confirmed or suspected source of infection?: No MEWS guidelines implemented : Yes, yellow Treat MEWS Interventions: Considered administering scheduled or prn medications/treatments as ordered Take Vital Signs Increase Vital Sign Frequency : Yellow: Q2hr x1, continue Q4hrs until patient remains green for 12hrs Escalate MEWS: Escalate: Yellow: Discuss with charge nurse and consider notifying provider and/or RRT   Pt with increased drowsiness, temp 120.2 VS WNL. MD notified see new orders for labs, chest xray.      Jeralene Peters 12/26/2022, 4:00 PM

## 2022-12-26 NOTE — Progress Notes (Signed)
Physical Therapy Treatment Patient Details Name: [name]. MRN: [id number] DOB: Sep 28, 1931 Today's Date: 12/26/2022   History of Present Illness Pt is an 89 y.o. male who presented 12/15/22 s/p fall with head trauma. CTH shows diffuse scattered traumatic pattern subarachnoid hemorrhage. Cortrak placed 8/12. PMHx includes COPD, prior NSTEMI / CHF, NSVT, PAF, anemia, bradycardia, senile osteoporosis, CAD, HLD, HTN    PT Comments  Pt lethargic, not following any commands, and total assist for all movement.Will not tolerate intensive rehab at this point. Agree with palliative involvement. Appears to have generalized pain with any movement with grimacing. Patient will benefit from continued inpatient follow up therapy, <3 hours/day if he is able to participate and demonstrate progress.     If plan is discharge home, recommend the following: Two people to help with walking and/or transfers;Two people to help with bathing/dressing/bathroom;Direct supervision/assist for financial management;Direct supervision/assist for medications management;Assistance with feeding;Help with stairs or ramp for entrance;Assist for transportation   Can travel by private vehicle     No  Equipment Recommendations  Other (comment) (to be determined)    Recommendations for Other Services       Precautions / Restrictions Precautions Precautions: Fall;Other (comment) Precaution Comments: cortrak; bil mitts Restrictions Weight Bearing Restrictions: No     Mobility  Bed Mobility Overal bed mobility: Needs Assistance Bed Mobility: Supine to Sit, Sit to Supine     Supine to sit: +2 for physical assistance, Total assist, HOB elevated Sit to supine: +2 for physical assistance, Total assist   General bed mobility comments: Assist for all aspects. Used bed pad to [employer] pt with pt providing no assistance    Transfers                   General transfer comment: Unable to attempt     Ambulation/Gait                   Stairs             Wheelchair Mobility     Tilt Bed    Modified Rankin (Stroke Patients Only)       Balance Overall balance assessment: Needs assistance Sitting-balance support: Feet supported, Bilateral upper extremity supported Sitting balance-Leahy Scale: Zero Sitting balance - Comments: Sat EOB x 15 minutes with max assist. Pushing into extension with legs pushing out straight Postural control: Posterior lean                                  Cognition Arousal: Obtunded Behavior During Therapy: Flat affect Overall Cognitive Status: Difficult to assess                 Rancho Levels of Cognitive Functioning [place]ant Scales of Cognitive Functioning: Localized Response: Total Assistance               General Comments: Pt not communicating except a few unintellible whispers. Did not follow any commands. obtunded until sat him upright   [facility] ([facility] Scales of Cognitive Functioning: Localized Response: Total Assistance [III]    Exercises General Exercises - Lower Extremity Heel Slides: PROM, Both, 10 reps, Supine    General Comments        Pertinent Vitals/Pain Pain Assessment Pain Assessment: Faces Faces Pain Scale: Hurts whole lot Pain Location: Seems generalized with any movement Pain Descriptors / Indicators: Grimacing, Guarding, Moaning Pain Intervention(s): Limited activity within patient's tolerance,  Monitored during session, Repositioned    Home Living                          Prior Function            PT Goals (current goals can now be found in the care plan section) Progress towards PT goals: Not progressing toward goals - comment    Frequency    Min 1X/week      PT Plan      Co-evaluation PT/OT/SLP Co-Evaluation/Treatment: Yes Reason for Co-Treatment: Necessary to address cognition/behavior during functional activity;For  patient/therapist safety;To address functional/ADL transfers PT goals addressed during session: Mobility/safety with mobility;Balance        AM-PAC PT "6 Clicks" Mobility   Outcome Measure  Help needed turning from your back to your side while in a flat bed without using bedrails?: Total Help needed moving from lying on your back to sitting on the side of a flat bed without using bedrails?: Total Help needed moving to and from a bed to a chair (including a wheelchair)?: Total Help needed standing up from a chair using your arms (e.g., wheelchair or bedside chair)?: Total Help needed to walk in hospital room?: Total Help needed climbing 3-5 steps with a railing? : Total 6 Click Score: 6    End of Session   Activity Tolerance: Patient limited by lethargy;Patient limited by pain Patient left: in bed;with call bell/phone within reach;with bed alarm set Nurse Communication: Mobility status PT Visit Diagnosis: Other abnormalities of gait and mobility (R26.89);Muscle weakness (generalized) (M62.81);Other symptoms and signs involving the nervous system (R29.898);Pain Pain - part of body:  (generalized)     Time: 5956-3875 PT Time Calculation (min) (ACUTE ONLY): 26 min  Charges:    $Therapeutic Activity: 8-22 mins PT General Charges $$ ACUTE PT VISIT: 1 Visit                     [facility] PT Acute Rehabilitation Services Office [phone]    Angelina Ok [facility] 12/26/2022, 2:29 PM

## 2022-12-26 NOTE — Progress Notes (Signed)
Occupational Therapy Treatment Patient Details Name: James Vill Sr. MRN: [id number] DOB: 1931/10/18 Today's Date: 12/26/2022   History of present illness Pt is an 89 y.o. male who presented 12/15/22 s/p fall with head trauma. CTH shows diffuse scattered traumatic pattern subarachnoid hemorrhage. Cortrak placed 8/12. PMHx includes COPD, prior NSTEMI / CHF, NSVT, PAF, anemia, bradycardia, senile osteoporosis, CAD, HLD, HTN   OT comments  Patient with functional decline and no progress towards goals. Patient follows no commands, and is total A for all aspects of care. Patient alerting when sitting EOB, however with increased cues, gestures, and extra time provided, no command following was noted. OT continues to support palliative involvement, with recommendations changed to lesser intensive rehab given current level of participation and ability. OT will continue to follow.       If plan is discharge home, recommend the following:  Two people to help with walking and/or transfers;A lot of help with bathing/dressing/bathroom;Direct supervision/assist for medications management;Direct supervision/assist for financial management;Assist for transportation;Help with stairs or ramp for entrance;Supervision due to cognitive status   Equipment Recommendations  Other (comment) (defer to next venue)    Recommendations for Other Services      Precautions / Restrictions Precautions Precautions: Fall;Other (comment) Precaution Comments: cortrak; bil mitts Restrictions Weight Bearing Restrictions: No       Mobility Bed Mobility Overal bed mobility: Needs Assistance Bed Mobility: Supine to Sit, Sit to Supine     Supine to sit: +2 for physical assistance, Total assist, HOB elevated Sit to supine: +2 for physical assistance, Total assist   General bed mobility comments: Assist for all aspects. Used bed pad to [employer] pt with pt providing no assistance    Transfers                    General transfer comment: Unable to attempt     Balance Overall balance assessment: Needs assistance Sitting-balance support: Feet supported, Bilateral upper extremity supported Sitting balance-Leahy Scale: Zero Sitting balance - Comments: Sat EOB x 15 minutes with max assist. Pushing into extension with legs pushing out straight Postural control: Posterior lean                                 ADL either performed or assessed with clinical judgement   ADL Overall ADL's : Needs assistance/impaired Eating/Feeding: NPO (Cortrak)   Grooming: Total assistance                               Functional mobility during ADLs: Total assistance;+2 for physical assistance;+2 for safety/equipment General ADL Comments: Patient now total A for all aspects of care    Extremity/Trunk Assessment              Vision       Perception     Praxis      Cognition Arousal: Obtunded Behavior During Therapy: Flat affect Overall Cognitive Status: Difficult to assess                 Rancho Levels of Cognitive Functioning [place]ant Scales of Cognitive Functioning: Localized Response: Total Assistance               General Comments: Pt not communicating except a few unintellible whispers. Did not follow any commands. obtunded until sat him upright [facility] Scales of Cognitive Functioning: Localized Response: Total  Assistance [III]      Exercises Exercises: General Lower Extremity General Exercises - Lower Extremity Heel Slides: PROM, Both, 10 reps, Supine    Shoulder Instructions       General Comments      Pertinent Vitals/ Pain       Pain Assessment Pain Assessment: Faces Faces Pain Scale: Hurts whole lot Pain Location: Seems generalized with any movement Pain Descriptors / Indicators: Grimacing, Guarding, Moaning Pain Intervention(s): Limited activity within patient's tolerance, Monitored during session,  Repositioned  Home Living                                          Prior Functioning/Environment              Frequency  Min 1X/week        Progress Toward Goals  OT Goals(current goals can now be found in the care plan section)  Progress towards OT goals: Not progressing toward goals - comment (Not following any commands)  Acute Rehab OT Goals Patient Stated Goal: did not respond OT Goal Formulation: Patient unable to participate in goal setting Time For Goal Achievement: 01/06/23 Potential to Achieve Goals: Poor  Plan      Co-evaluation      Reason for Co-Treatment: Necessary to address cognition/behavior during functional activity;For patient/therapist safety;To address functional/ADL transfers PT goals addressed during session: Mobility/safety with mobility;Balance        AM-PAC OT "6 Clicks" Daily Activity     Outcome Measure   Help from another person eating meals?: Total (NPO) Help from another person taking care of personal grooming?: Total Help from another person toileting, which includes using toliet, bedpan, or urinal?: Total Help from another person bathing (including washing, rinsing, drying)?: Total Help from another person to put on and taking off regular upper body clothing?: Total Help from another person to put on and taking off regular lower body clothing?: Total 6 Click Score: 6    End of Session    OT Visit Diagnosis: Unsteadiness on feet (R26.81);Other symptoms and signs involving the nervous system (R29.898);Other symptoms and signs involving cognitive function   Activity Tolerance Patient limited by fatigue;Patient limited by pain   Patient Left in chair;with call bell/phone within reach;with chair alarm set;with family/visitor present;with restraints reapplied   Nurse Communication Mobility status;Other (comment) (no command following)        Time: 1610-9604 OT Time Calculation (min): 28 min  Charges: OT  General Charges $OT Visit: 1 Visit OT Treatments $Self Care/Home Management : 8-22 mins  Pollyann Glen E. Kanyon Seibold, OTR/L Acute Rehabilitation Services [phone]   Cherlyn Cushing 12/26/2022, 2:44 PM

## 2022-12-26 NOTE — Progress Notes (Signed)
Speech Language Pathology Treatment: Cognitive-Linquistic  Patient Details Name: James Thibert Sr. MRN: [id number] DOB: [birth year] Today's Date: 12/26/2022 Time: 3086-5784 SLP Time Calculation (min) (ACUTE ONLY): 9 min  Assessment / Plan / Recommendation Clinical Impression  Per RN, pt has roused minimally today. SLP attempted to rouse pt with functional self-care activities such as washing his face and providing oral care. Pt moaned throughout session or verbalized phrases that were repeated from SLP, although did not follow any commands today. SLP with multiple attempts at stimulating pt response and involvement with little to no success. Given pt's mentation, PO trials were not attempted today. Recommend continued SLP f/u for cognition and swallowing as mentation allows.    HPI HPI: Pt is an 89 y.o. male who presented with AMS after a fall. CT head: Scattered areas of subarachnoid hemorrhage in the left occipital  lobe, bilateral high frontal lobes, and right temporal lobe. Small amount of subarachnoid hemorrhage posterior to the right midbrain extending into the cerebral aqueduct. PMH: COPD, prior NSTEMI / CHF, NSVT, PAF, on ASA81.      SLP Plan  Continue with current plan of care      Recommendations for follow up therapy are one component of a multi-disciplinary discharge planning process, led by the attending physician.  Recommendations may be updated based on patient status, additional functional criteria and insurance authorization.    Recommendations  Diet recommendations: NPO                  Oral care QID;Staff/trained caregiver to provide oral care   Frequent or constant Supervision/Assistance Dysphagia, unspecified (R13.10);Cognitive communication deficit (O96.295)     Continue with current plan of care     Gwynneth Aliment, M.A., CF-SLP Speech Language Pathology, Acute Rehabilitation Services  Secure Chat preferred [phone]   12/26/2022, 3:36 PM

## 2022-12-26 NOTE — Progress Notes (Signed)
[facility] PROGRESS NOTE  James Minotti Sr. (DOB: May 09, 1932) GMW:[id number] PCP: Blane Ohara, MD  Brief Narrative: James Martines Sr. is an 89 y.o. male with a history of A-fib, NSVT, ischemic cardiomyopathy (NSTEMI s/p LAD stent 2022), HFrEF (LVEF improved to 55% 2022 from 35%), HTN, COPD, hypothyroidism, chronic anemia, frequent falls who presented 8/8 after a fall found to have a traumatic subarachnoid hemorrhage.   Hospitalization has been complicated by E. coli UTI, asymptomatic runs of AFib and PVCs for which cardiology was consulted, acute urinary retention for which urology was consulted, placed urinary catheter 8/12, dysphagia for which cortrak tube has been placed, and persistent encephalopathy. Palliative care is following.    Subjective: No changes in mental status, did not open eyes for me this morning, does vocalize but not discernible. No overnight events noted.   Objective: BP 112/71 (BP Location: Right Arm)   Pulse (!) 112   Temp (!) 100.7 F (38.2 C) (Axillary)   Resp (!) 24   Ht 6' (1.829 m)   Wt 58.5 kg   SpO2 98%   BMI 17.49 kg/m   Gen: Elderly frail male in no distress HEENT: L nare cortrak in place, needs oral care Pulm: Clear anteriorly, nonlabored, 98% room air  CV: Frequent PACs, PVCs, rate overall in 100-110s, no MRG no edema.  GI: Soft, NT, ND, +BS  Neuro: Not cooperative with exam Ext: Warm, no deformities. Skin: No new rashes, lesions or ulcers on visualized skin   Assessment & Plan: Traumatic subarachnoid hemorrhage: - Continuing observation per neurosurgery. Plan to repeat CT head in a couple weeks per their notes. Last CT 8/13 mentions vasogenic edema, and expected redistribution of contusions. ?of mass though wasn't present previously. Pt cannot tolerate MRI (even CT was motion degraded), so we're deferring further imaging at this time.  TBI with acute encephalopathy:  - Optimize medical and nutritional status - He did not have  significant delirium during hospitalization in 2022, so this is likely related to TBI/hemorrhage.  - Palliative following, we are giving time for outcomes. No meaningful improvement noted at this time. D/w palliative care 8/18.  - Continue mittens to protect apparatuses. Wrist restraints thankfully are able to be stopped.  Severe protein calorie malnutrition: Body mass index is 17.49 kg/m.  - Feeding thru cortrak. Keep NPO per SLP evaluation. Will continue seeing how his clinical status progresses.    E. coli UTI: Resistant to ancef. Leukocytosis stable.  - Continue ceftriaxone given indwelling foley and continued leukocytosis. D/w ID. Would not transition to oral 3rd gen cephalosporin based on susceptibilities, but ceftriaxone IV should be effective. WBC remains elevated but has plateaued and he's afebrile. Plan is to continue to monitor closely.   Acute urinary retention, BPH, prostate CA s/p brachytherapy: - Urology consulted, placed catheter 8/12 over a wire. Will continue catheter at least 7 days.   PAF w/RVR, frequent PVCs, hx NSVT: Currently in sinus rhythm with PACs, PVCs. - Rate elevated a bit, will increase metoprolol to 50mg  BID - Cardiology consulted - Continue metoprolol 5mg  IV prn  - Holding anticoagulation due to fall risk and current SAH, SDH. Not a good candidate for Watchman device.   CAD s/p LAD stent 2022: elevated troponin is mild at 32, flat, without angina consistent with demand myocardial ischemia.  - Continue beta blocker, statin. Not on antiplatelet due to bleeding.   Chronic HFrEF with improved EF (35>55%, 2022): - Continue metoprolol.   - Remains euvolemic, continue osmolite 1.2 for  full nutritional support, continue free water q6h.   Hypokalemia: Resolved.  - Continue monitoring in AM  Hyponatremia:  - Decrease free water, monitor in AM   Hypophosphatemia: Resolved with supplementation.   Chronic normocytic anemia: No bleeding, urine without gross  hematuria. Deferred GI work up PTA due to frailty.  - Monitor intermittently, remains stable.   Moderate persistent asthma, uncomplicated: No exacerbation. - Continue DuoNebs.   Acquired hypothyroidism: TSH is 6.177, free T4 is normal.  - Continue levothyroxine 175 mcg daily   Essential hypertension - Continue metoprolol   Debility, deconditioning, frailty: At current level, would require SNF level of care. Continue mobilizing as able.    Goals of care.  Patient with guarded prognosis.  Palliative care on board.  Plan is to continue current level of care at this time.  Will need to continue reassessing in coming days. Has not, to date made much meaningful improvement.   Tyrone Nine, MD [facility] [email or web address] 12/26/2022, 8:07 AM

## 2022-12-26 NOTE — Progress Notes (Signed)
Palliative Medicine Inpatient Follow Up Note HPI: James Mattinson Sr. is a 89 y.o. male with past medical history of Nonsustained V. tach, A-fib, ischemic cardiomyopathy (HFrEF with improved EF 35% improved to 55% 2022) HTN, COPD, hypothyroidism, chronic anemia, frequent falls. S/P traumatic subarachnoid hemorrhage. Palliative care has been asked to get involved to support additional goals of care conversations.   Today's Discussion 12/26/2022  *Please note that this is a verbal dictation therefore any spelling or grammatical errors are due to the "[employer]agon [employer] One" system interpretation.  Chart reviewed inclusive of vital signs, progress notes, laboratory results, and diagnostic images.   I met with [name] and his daughter, [name] at bedside this morning. We discussed that for the last few days he has been relatively sleepy and not able to converse. I shared my concerns should this state continue in addition to the idea of long term nutrition and if [name] would ever desire such aggressive measures. [name] remains to be hopeful though within the realm of reality that he may not improve.   I shared that I would remain involved to provide additional support moving forward. [name] is aware that if no improvements are seen despite present efforts harder conversation will be explored.   Questions and concerns addressed.  Objective Assessment: Vital Signs Vitals:   12/26/22 0750 12/26/22 1052  BP: 112/71 102/70  Pulse: (!) 112 84  Resp: (!) 24 (!) 22  Temp: (!) 100.7 F (38.2 C) 100.2 F (37.9 C)  SpO2: 98% 100%    Intake/Output Summary (Last 24 hours) at 12/26/2022 1257 Last data filed at 12/26/2022 1200 Gross per 24 hour  Intake 150 ml  Output 950 ml  Net -800 ml   Last Weight  Most recent update: 12/26/2022  6:01 AM    Weight  58.5 kg (128 lb 15.5 oz)            Gen:  Frail elderly Caucasian M in NAD HEENT: moist mucous membranes CV: Irregular rate and rhythm  PULM:  On 2LPM [place], breathing is even and non-labored ABD: soft/nontender  EXT: No edema  Neuro: Somenolent  SUMMARY OF RECOMMENDATIONS   Full code --> Has a "Hard Choices" book to review. Ideally patient to clear enough to guide medical team for the future   Goals at this time are to allow time for outcomes   Ongoing PMT support  Symptom Management:  Delirium: Non-pharmacologic Delirium Precautions  - Frequent re-orientation; update board w/ date, names of treatment team  - Daytime stimulation: Open curtains, turn lights on, and turn on television as tolerated during waking hours  - Nighttime calm - close curtains, turn lights off, and turn off television at 9pm  with minimal interruptions from treatment team during sleeping hours, including avoiding lab draws if possible  - Encourage continued presence of family/friends  - Occupy w/ distractions - e.g. ask them to fold washcloths, busy-board  - Encourage movement with PT/OT as tolerated  - Ensure adequate sensorium, to include providing glasses, dentures, and hearing aids to patient as appropriate  - Ensure adequate nutrition and fluid intake  - Monitor for regular bowel movements  Billing based on MDM: Moderate ______________________________________________________________________________________ Lamarr Lulas [place] Palliative Medicine Team Team Cell Phone: [phone] Please utilize secure chat with additional questions, if there is no response within 30 minutes please call the above phone number  Palliative Medicine Team providers are available by phone from 7am to 7pm daily and can be reached through the team cell  phone.  Should this patient require assistance outside of these hours, please call the patient's attending physician.

## 2022-12-27 ENCOUNTER — Telehealth: Payer: Self-pay

## 2022-12-27 DIAGNOSIS — I609 Nontraumatic subarachnoid hemorrhage, unspecified: Secondary | ICD-10-CM | POA: Diagnosis not present

## 2022-12-27 LAB — GLUCOSE, CAPILLARY
Glucose-Capillary: 117 mg/dL — ABNORMAL HIGH (ref 70–99)
Glucose-Capillary: 120 mg/dL — ABNORMAL HIGH (ref 70–99)
Glucose-Capillary: 120 mg/dL — ABNORMAL HIGH (ref 70–99)
Glucose-Capillary: 124 mg/dL — ABNORMAL HIGH (ref 70–99)
Glucose-Capillary: 128 mg/dL — ABNORMAL HIGH (ref 70–99)
Glucose-Capillary: 130 mg/dL — ABNORMAL HIGH (ref 70–99)

## 2022-12-27 NOTE — Progress Notes (Signed)
Nutrition Follow-up  DOCUMENTATION CODES:   Severe malnutrition in context of chronic illness  INTERVENTION:   Continue tube feeds via Cortrak: - Osmolite 1.2 @ 65 ml/hr (1560 ml/day) - Free water flushes per MD, currently 100 ml every 8 hours  Tube feeding regimen provides 1872 kcal, 87 grams of protein, and 1279 ml of H2O.  Total free water with flushes: 1579 ml  - d/c Banatrol TF BID per tube as last documented BM was on 8/15  NUTRITION DIAGNOSIS:   Severe Malnutrition related to chronic illness (COPD, HFrEF) as evidenced by severe fat depletion, severe muscle depletion.  Ongoing, being addressed via TF  GOAL:   Patient will meet greater than or equal to 90% of their needs  Met via TF  MONITOR:   Diet advancement, Labs, Weight trends, TF tolerance  REASON FOR ASSESSMENT:   Consult Assessment of nutrition requirement/status, Enteral/tube feeding initiation and management  ASSESSMENT:   89 year old male who presented to the ED on 8/08 after a fall with head trauma. PMH of COPD, prior NSTEMI, HFrEF, PAF, HTN, HLD. Pt admitted with SAH, acute metabolic encephalopathy.  08/09 - diet advanced to full liquids 08/11 - SLP evaluation, recommendation for full liquid diet 08/12 - SLP recommending NPO due to lethargy, Cortrak placed (tip gastric)  Spoke with pt's daughter at bedside. Pt with tube feeds infusing via Cortrak at goal rate at time of RD visit. Pt with eyes open today and interacting with RD. Discussed with pt's daughter that pt has not had a documented BM since 8/15 and Banatrol TF will be d/c. Pt on bowel regimen with colace.  GOC discussions are ongoing. Palliative Medicine Team is involved.  Admit weight: 63.5 kg (appears to be stated/estimated) Current weight: 58.5 kg (copied over from previous days)  Current TF: Osmolite 1.2 @ 65 ml/hr, free water flushes 100 ml every 8 hours  Medications reviewed and include: colace, pepcid, banatrol TF 60 ml  BID  Labs reviewed: sodium 130 on 8/19, WBC 16.4 on 8/19 CBG's: 117-131 x 24 hours  UOP: 1750 ml x 24 hours  Diet Order:   Diet Order             Diet NPO time specified  Diet effective now                   EDUCATION NEEDS:   Education needs have been addressed (spoke with pt's daughter)  Skin:  Skin Assessment: Reviewed RN Assessment (laceration R eye)  Last BM:  12/22/22  Height:   Ht Readings from Last 1 Encounters:  12/16/22 6' (1.829 m)    Weight:   Wt Readings from Last 1 Encounters:  12/27/22 58.5 kg    Ideal Body Weight:  80.9 kg  BMI:  Body mass index is 17.49 kg/m.  Estimated Nutritional Needs:   Kcal:  1750-1950  Protein:  80-95 grams  Fluid:  1.7-1.9 L    Mertie Clause, MS, RD, LDN Registered Dietitian II Please see AMiON for contact information.

## 2022-12-27 NOTE — Progress Notes (Signed)
Speech Language Pathology Treatment: Dysphagia  Patient Details Name: James Eckenroth Sr. MRN: [id number] DOB: September 05, 1931 Today's Date: 12/27/2022 Time: 0347-4259 SLP Time Calculation (min) (ACUTE ONLY): 17 min  Assessment / Plan / Recommendation Clinical Impression  Patient seen for swallowing treatment with focus on po trials/readiness for instrumental exam. Patient alert and interactive verbally with clinician today. Able to orally accept ice chips, thin liquids via straw, and pureed solids with what appears to be normal oral transit of bolus. Pharyngeally, he continues to present with  multiple swallows per bolus suggestive of pharyngeal residue, with s/s of aspiration noted only following intake of thin liquids via straw. As long as patient continues to present with thin level of alertness, recommend proceeding with MBS next date to determine if patient can move towards a po diet. Daughter in room. Education provided.    HPI HPI: Pt is an 89 y.o. male who presented with AMS after a fall. CT head: Scattered areas of subarachnoid hemorrhage in the left occipital  lobe, bilateral high frontal lobes, and right temporal lobe. Small amount of subarachnoid hemorrhage posterior to the right midbrain extending into the cerebral aqueduct. PMH: COPD, prior NSTEMI / CHF, NSVT, PAF, on ASA81.      SLP Plan  MBS      Recommendations for follow up therapy are one component of a multi-disciplinary discharge planning process, led by the attending physician.  Recommendations may be updated based on patient status, additional functional criteria and insurance authorization.    Recommendations  Diet recommendations: NPO Medication Administration: Via alternative means                  Oral care QID;Staff/trained caregiver to provide oral care   Frequent or constant Supervision/Assistance Dysphagia, unspecified (R13.10);Cognitive communication deficit (R41.841)     MBS    Irineo Gaulin MA,  CCC-SLP  William Schake Meryl  12/27/2022, 2:24 PM

## 2022-12-27 NOTE — Progress Notes (Signed)
[facility] PROGRESS NOTE  James Calver Sr. (DOB: 01-16-1932) ZOX:[id number] PCP: Blane Ohara, MD  Brief Narrative: James Bruski Sr. is an 89 y.o. male with a history of A-fib, NSVT, ischemic cardiomyopathy (NSTEMI s/p LAD stent 2022), HFrEF (LVEF improved to 55% 2022 from 35%), HTN, COPD, hypothyroidism, chronic anemia, frequent falls who presented 8/8 after a fall found to have a traumatic subarachnoid hemorrhage.   Hospitalization has been complicated by E. coli UTI, asymptomatic runs of AFib and PVCs for which cardiology was consulted, acute urinary retention for which urology was consulted, placed urinary catheter 8/12, dysphagia for which cortrak tube has been placed, and persistent encephalopathy. Palliative care is following.    Subjective: No overnight events. Had isolated fever yesterday afternoon, none since.   Objective: BP 130/71 (BP Location: Left Arm)   Pulse 80   Temp 99 F (37.2 C) (Oral)   Resp 18   Ht 6' (1.829 m)   Wt 58.5 kg   SpO2 98%   BMI 17.49 kg/m   Gen: Elderly, frail male in no acute distress Neck: Supple, no meningismus. Pulm: Clear, nonlabored  CV: RRR with occasional premature beat, no JVD or edema GI: Soft, NT, ND, +BS  Neuro: Doesn't open eyes but moves all extremities, doesn't follow commands, does verbalize occasionally but not very coherent. Ext: Warm, no deformities Skin: No draining wounds on visualized skin   Assessment & Plan: Traumatic subarachnoid hemorrhage: - Continuing observation per neurosurgery. Plan to repeat CT head in a couple weeks per their notes. Last CT 8/13 mentions vasogenic edema, and expected redistribution of contusions. ?of mass though wasn't present previously. Pt cannot tolerate MRI (even CT was motion degraded), so we're deferring further imaging at this time.  Fever, persistent leukocytosis: Isolated fever in neuro pt 8/19. No s/sxs or VTE and has been getting prophylaxis w/heparin. No respiratory  symptoms and no consolidation on CXR. UA without pyuria. No cellulitis by exam. No meningismus.  - Continue to monitor blood cultures off antibiotics for now. Low threshold for empiric coverage. Leukocytosis has come down.    TBI with acute encephalopathy:  - Optimize medical and nutritional status - He did not have significant delirium during hospitalization in 2022, so this is likely related to TBI/hemorrhage.  - Palliative following, we are giving time for outcomes. No meaningful improvement noted at this time. D/w palliative care 8/18 and 8/19.  - Continue mittens to protect apparatuses.   Severe protein calorie malnutrition: Body mass index is 17.49 kg/m.  - Feeding thru cortrak. Keep NPO per SLP evaluation. Will continue seeing how his clinical status progresses.    E. coli UTI: Resistant to ancef. Leukocytosis stable.  - Received ceftriaxone x7 days. This should be effective, confirmed w/ID.     Acute urinary retention, BPH, prostate CA s/p brachytherapy: - Urology consulted, placed catheter 8/12 over a wire. Will continue catheter at least 7 days.   PAF w/RVR, frequent PVCs, hx NSVT: Currently in sinus rhythm with PACs, PVCs. - Rate elevated a bit, increased metoprolol to 50mg  BID - Cardiology consulted - Continue metoprolol 5mg  IV prn  - Holding full dose anticoagulation due to fall risk and current SAH, SDH. Not a good candidate for Watchman device.   CAD s/p LAD stent 2022: elevated troponin is mild at 32, flat, without angina consistent with demand myocardial ischemia.  - Continue beta blocker, statin. Not on antiplatelet due to bleeding.   Chronic HFrEF with improved EF (35>55%, 2022): - Continue metoprolol.   -  Remains euvolemic, continue osmolite 1.2 for full nutritional support, continue free water q6h.   Hypokalemia: Resolved.  - Continue monitoring  Hyponatremia:  - Decreased free water and is improving. Continue monitoring   Hypophosphatemia: Resolved  with supplementation.   Chronic normocytic anemia: No bleeding, urine without gross hematuria. Deferred GI work up PTA due to frailty.  - Monitor intermittently, remains stable.   Moderate persistent asthma, uncomplicated: No exacerbation. - Continue DuoNebs.   Acquired hypothyroidism: TSH is 6.177, free T4 is normal.  - Continue levothyroxine 175 mcg daily   Essential hypertension - Continue metoprolol   Debility, deconditioning, frailty: At current level, would require SNF level of care. Continue mobilizing as able.    Goals of care.  Patient with guarded prognosis.  Palliative care on board.  Plan is to continue current level of care at this time.  Will need to continue reassessing in coming days. Has not, to date made much meaningful improvement.   Tyrone Nine, MD [facility] [email or web address] 12/27/2022, 7:35 AM

## 2022-12-27 NOTE — Telephone Encounter (Signed)
Marcelino Duster the daughter of Daundre called this morning to follow-up on the results of the x-rays of the his knees. Marcelino Duster stated that it was done about two weeks ago and no one has called. Marcelino Duster was notified that we do have the results but I do not see where it has been signed off on just yet. Marcelino Duster was notified that I would let the provider know and someone should call her back with the results. Marcelino Duster stated that Wwilliam fell on 12/15/2022 and was admitted in to [facility] and he is currently still there.  NOTE: I found a copy of the x-rays under Media.

## 2022-12-28 ENCOUNTER — Inpatient Hospital Stay (HOSPITAL_COMMUNITY): Payer: Medicare HMO

## 2022-12-28 DIAGNOSIS — I609 Nontraumatic subarachnoid hemorrhage, unspecified: Secondary | ICD-10-CM | POA: Diagnosis not present

## 2022-12-28 DIAGNOSIS — Z515 Encounter for palliative care: Secondary | ICD-10-CM | POA: Diagnosis not present

## 2022-12-28 LAB — CBC
HCT: 31.3 % — ABNORMAL LOW (ref 39.0–52.0)
Hemoglobin: 10.4 g/dL — ABNORMAL LOW (ref 13.0–17.0)
MCH: 32.1 pg (ref 26.0–34.0)
MCHC: 33.2 g/dL (ref 30.0–36.0)
MCV: 96.6 fL (ref 80.0–100.0)
Platelets: 355 10*3/uL (ref 150–400)
RBC: 3.24 MIL/uL — ABNORMAL LOW (ref 4.22–5.81)
RDW: 12.5 % (ref 11.5–15.5)
WBC: 16.6 10*3/uL — ABNORMAL HIGH (ref 4.0–10.5)
nRBC: 0 % (ref 0.0–0.2)

## 2022-12-28 LAB — GLUCOSE, CAPILLARY
Glucose-Capillary: 111 mg/dL — ABNORMAL HIGH (ref 70–99)
Glucose-Capillary: 117 mg/dL — ABNORMAL HIGH (ref 70–99)
Glucose-Capillary: 128 mg/dL — ABNORMAL HIGH (ref 70–99)
Glucose-Capillary: 133 mg/dL — ABNORMAL HIGH (ref 70–99)
Glucose-Capillary: 134 mg/dL — ABNORMAL HIGH (ref 70–99)
Glucose-Capillary: 142 mg/dL — ABNORMAL HIGH (ref 70–99)
Glucose-Capillary: 142 mg/dL — ABNORMAL HIGH (ref 70–99)

## 2022-12-28 LAB — BASIC METABOLIC PANEL
Anion gap: 9 (ref 5–15)
BUN: 20 mg/dL (ref 8–23)
CO2: 23 mmol/L (ref 22–32)
Calcium: 8.1 mg/dL — ABNORMAL LOW (ref 8.9–10.3)
Chloride: 96 mmol/L — ABNORMAL LOW (ref 98–111)
Creatinine, Ser: 0.66 mg/dL (ref 0.61–1.24)
GFR, Estimated: >60 mL/min (ref >60)
Glucose, Bld: 144 mg/dL — ABNORMAL HIGH (ref 70–99)
Potassium: 5 mmol/L (ref 3.5–5.1)
Sodium: 128 mmol/L — ABNORMAL LOW (ref 135–145)

## 2022-12-28 LAB — URINE CULTURE: Culture: NO GROWTH

## 2022-12-28 NOTE — Care Management Important Message (Signed)
Important Message  Patient Details  Name: James Champeau Sr. MRN: [id number] Date of Birth: [birth year]   Medicare Important Message Given:  Yes     [name] 12/28/2022, 1:39 PM

## 2022-12-28 NOTE — Progress Notes (Signed)
[facility] PROGRESS NOTE  James Cantarella Sr. (DOB: 06-12-1931) WUJ:[id number]   PCP: Blane Ohara, MD    Brief Narrative: James Nim Sr. is an 89 y.o. male with a history of A-fib, NSVT, ischemic cardiomyopathy (NSTEMI s/p LAD stent 2022), HFrEF (LVEF improved to 55% 2022 from 35%), HTN, COPD, hypothyroidism, chronic anemia, frequent falls who presented 8/8 after a fall found to have a traumatic subarachnoid hemorrhage.   Hospitalization has been complicated by E. coli UTI, asymptomatic runs of AFib and PVCs for which cardiology was consulted, acute urinary retention for which urology was consulted, placed urinary catheter 8/12, dysphagia for which cortrak tube has been placed, and persistent encephalopathy. Palliative care is following.    Subjective: No overnight events  Objective: BP 119/66 (BP Location: Right Arm)   Pulse (!) 121   Temp 98.5 F (36.9 C) (Oral)   Resp (!) 26   Ht 6' (1.829 m)   Wt 58 kg   SpO2 97%   BMI 17.34 kg/m   Gen: Elderly, frail male in no acute distress Pulm: Clear, nonlabored  CV: RRR  GI: Soft, NT, ND, +BS  Neuro: Doesn't open eyes but moves all extremities, doesn't follow commands, does verbalize occasionally but not very coherent.   Assessment & Plan: Traumatic subarachnoid hemorrhage: - Continuing observation per neurosurgery. Plan to repeat CT head in a couple weeks per their notes. Last CT 8/13 mentions vasogenic edema, and expected redistribution of contusions. ?of mass though wasn't present previously. Pt cannot tolerate MRI (even CT was motion degraded), so we're deferring further imaging at this time.  Fever, persistent leukocytosis: Isolated fever in neuro pt 8/19. No s/sxs or VTE and has been getting prophylaxis w/heparin. No respiratory symptoms and no consolidation on CXR. UA without pyuria. No cellulitis by exam. No meningismus.  - Continue to monitor blood cultures off antibiotics for now. Low threshold for empiric  coverage. Leukocytosis has come down.    TBI with acute encephalopathy:  - Optimize medical and nutritional status - He did not have significant delirium during hospitalization in 2022, so this is likely related to TBI/hemorrhage.  - Palliative following, we are giving time for outcomes. No meaningful improvement noted at this time. D/w palliative care 8/18 and 8/19.  - Continue mittens to protect apparatuses.   Severe protein calorie malnutrition: Body mass index is 17.34 kg/m.  - Feeding thru cortrak. Keep NPO per SLP evaluation. Will continue seeing how his clinical status progresses.    E. coli UTI: Resistant to ancef. Leukocytosis stable.  - Received ceftriaxone x7 days. This should be effective, confirmed w/ID.     Acute urinary retention, BPH, prostate CA s/p brachytherapy: - Urology consulted, placed catheter 8/12 over a wire. Will continue catheter at least 7 days.   PAF w/RVR, frequent PVCs, hx NSVT: Currently in sinus rhythm with PACs, PVCs. - Rate elevated a bit, increased metoprolol to 50mg  BID - Cardiology consulted - Continue metoprolol 5mg  IV prn  - Holding full dose anticoagulation due to fall risk and current SAH, SDH. Not a good candidate for Watchman device.   CAD s/p LAD stent 2022: elevated troponin is mild at 32, flat, without angina consistent with demand myocardial ischemia.  - Continue beta blocker, statin. Not on antiplatelet due to bleeding.   Chronic HFrEF with improved EF (35>55%, 2022): - Continue metoprolol.   - Remains euvolemic, continue osmolite 1.2 for full nutritional support, continue free water q6h.   Hypokalemia: Resolved.  - Continue monitoring  Hyponatremia:  - Decreased free water and is improving. Continue monitoring   Hypophosphatemia: Resolved with supplementation.   Chronic normocytic anemia: No bleeding, urine without gross hematuria. Deferred GI work up PTA due to frailty.  - Monitor intermittently, remains stable.    Moderate persistent asthma, uncomplicated: No exacerbation. - Continue DuoNebs.   Acquired hypothyroidism: TSH is 6.177, free T4 is normal.  - Continue levothyroxine 175 mcg daily   Essential hypertension - Continue metoprolol   Debility, deconditioning, frailty: At current level, would require SNF level of care. Continue mobilizing as able.    Goals of care.  Patient with guarded prognosis.  Palliative care on board.  Plan is to continue current level of care at this time.  Will need to continue reassessing in coming days. Has not,  made much meaningful improvement.   James Art, DO [facility] [email or web address] 12/28/2022, 11:02 AM

## 2022-12-28 NOTE — Procedures (Addendum)
Modified Barium Swallow Study  Patient Details  Name: James Well Sr. MRN: [id number] Date of Birth: 1931-10-17  Today's Date: 12/28/2022  HPI/PMH: HPI: Pt is an 89 y.o. male who presented with AMS after a fall. CT head: Scattered areas of subarachnoid hemorrhage in the left occipital  lobe, bilateral high frontal lobes, and right temporal lobe. Small amount of subarachnoid hemorrhage posterior to the right midbrain extending into the cerebral aqueduct. PMH: COPD, prior NSTEMI / CHF, NSVT, PAF, on ASA81.   Clinical Impression: Clinical Impression: Patient presents with a mild-moderate oral dysphagia and a moderately impaired pharyngeal phase dysphagia as per this MBS. SLP suspects that patient has a chronic dysphagia that is exacerbated by his current cognitive state. During oral phase, patient with delayed anterior to posterior transit of boluses as well as impaired awareness to boluses, leading him to bite at spoon, hold liquids in mouth and have difficulty initiating straw sips. SLP assessed his swallowing with thin liquids and nectar thick liquids. Cortrak feeding tube was present during this test. Epiglottis appeared somewhat edematous and in addition, posterior pharyngeal wall was bulging (suspected cervical osteophytes but no radiologist present to confirm). This resulted in epiglottis making contact with posterior pharyngeal wall during swallow which in turn caused impacted pharyngeal clearance of boluses. After initial swallows of nectar thick and thin liquids, patient with moderate amount of barium remaining in vallecular sinus, posterior pharyngeal wall and pyriform sinus. Patient was frequently moving around and sliding down in chair, which prevented a clear view of PES or trachea. There was an instance of penetration above vocal cords visualized one time and suspected to be occuring frequently. SLP recommending continue NPO at this time but allow ice chips, water sips PRN. Plan for repeat  MBS when patient's cognition has improved.  Factors that may increase risk of adverse event in presence of aspiration Rubye Oaks & Clearance Coots 2021): Factors that may increase risk of adverse event in presence of aspiration Rubye Oaks & Clearance Coots 2021): Poor general health and/or compromised immunity; Reduced cognitive function; Frail or deconditioned; Dependence for feeding and/or oral hygiene; Inadequate oral hygiene   Recommendations/Plan: Swallowing Evaluation Recommendations Swallowing Evaluation Recommendations Recommendations: NPO Medication Administration: Via alternative means Oral care recommendations: Oral care QID (4x/day); Oral care before ice chips/water; Staff/trained caregiver to provide oral care    Treatment Plan Treatment Plan Treatment recommendations: Therapy as outlined in treatment plan below Follow-up recommendations: Skilled nursing-short term rehab (<3 hours/day) Functional status assessment: Patient has had a recent decline in their functional status and demonstrates the ability to make significant improvements in function in a reasonable and predictable amount of time. Treatment frequency: Min 2x/week Treatment duration: 2 weeks Interventions: Aspiration precaution training; Trials of upgraded texture/liquids; Diet toleration management by SLP; Patient/family education     Recommendations Recommendations for follow up therapy are one component of a multi-disciplinary discharge planning process, led by the attending physician.  Recommendations may be updated based on patient status, additional functional criteria and insurance authorization.  Assessment: Orofacial Exam: Orofacial Exam Oral Cavity: Oral Hygiene: WFL Oral Cavity - Dentition: Poor condition; Missing dentition; Dentures, top; Dentures, not available Orofacial Anatomy: WFL    Anatomy:  Anatomy: Suspected cervical osteophytes   Boluses Administered: Boluses Administered Boluses Administered: Thin  liquids (Level 0); Mildly thick liquids (Level 2, nectar thick)     Oral Impairment Domain: Oral Impairment Domain Lip Closure: No labial escape Tongue control during bolus hold: Not tested Bolus preparation/mastication: Slow prolonged chewing/mashing with complete recollection Bolus transport/lingual  motion: Delayed initiation of tongue motion (oral holding) Oral residue: Residue collection on oral structures Location of oral residue : Tongue; Palate Initiation of pharyngeal swallow : Valleculae     Pharyngeal Impairment Domain: Pharyngeal Impairment Domain Laryngeal vestibule closure: None, wide column air/contrast in laryngeal vestibule Pharyngeal stripping wave : Present - diminished Pharyngeal contraction (A/P view only): N/A Pharyngoesophageal segment opening: Partial distention/partial duration, partial obstruction of flow Tongue base retraction: Narrow column of contrast or air between tongue base and PPW Pharyngeal residue: Majority of contrast within or on pharyngeal structures Location of pharyngeal residue: Valleculae; Pharyngeal wall; Pyriform sinuses; Aryepiglottic folds     Esophageal Impairment Domain: No data recorded  Pill: No data recorded  Penetration/Aspiration Scale Score: Penetration/Aspiration Scale Score 3.  Material enters airway, remains ABOVE vocal cords and not ejected out: Mildly thick liquids (Level 2, nectar thick); Thin liquids (Level 0)    Compensatory Strategies: Compensatory Strategies Compensatory strategies: No       General Information: Caregiver present: No   Diet Prior to this Study: NPO    Temperature : Normal    Respiratory Status: WFL    Supplemental O2: None (Room air)    History of Recent Intubation: No   Behavior/Cognition: Alert; Cooperative; Confused; Requires cueing; Doesn't follow directions  Self-Feeding Abilities: Dependent for feeding  Baseline vocal quality/speech: Not observed  Volitional Cough:  Unable to elicit  Volitional Swallow: Unable to elicit  Exam Limitations: Poor participation   Goal Planning: Prognosis for improved oropharyngeal function: Good  Barriers to Reach Goals: Cognitive deficits; Severity of deficits  No data recorded Patient/Family Stated Goal: none stated  Consulted and agree with results and recommendations: Pt unable/family or caregiver not available   Pain: Pain Assessment Pain Assessment: Faces Faces Pain Scale: 0 Breathing: 0 Negative Vocalization: 0 Facial Expression: 0 Body Language: 0 Consolability: 0 PAINAD Score: 0 Pain Location: L leg with PROM Pain Descriptors / Indicators: Discomfort; Grimacing; Guarding Pain Intervention(s): Limited activity within patient's tolerance; Monitored during session; Repositioned    End of Session: Start Time:SLP Start Time (ACUTE ONLY): 1400  Stop Time: SLP Stop Time (ACUTE ONLY): 1417  Time Calculation:SLP Time Calculation (min) (ACUTE ONLY): 17 min  Charges: SLP Evaluations $ SLP Speech Visit: 1 Visit  SLP Evaluations $Swallowing Treatment: 1 Procedure   SLP visit diagnosis: SLP Visit Diagnosis: Dysphagia, oropharyngeal phase (R13.12)    Past Medical History:  Past Medical History:  Diagnosis Date   Acquired hypothyroidism 08/15/2019   Acute systolic heart failure (HCC) 06/25/2020   Anemia 02/05/2020   boarderline anemic   Atopic dermatitis 12/05/2019   Bacterial pneumonia, unspecified 06/14/2012   06/14/2012 CXR w/ RML PNA >Zithromax and Rocephin     Bradycardia 11/28/2018   CAD (coronary artery disease) 07/07/2020   Cancer (HCC)    prostate cancer-seed implant   Closed fracture dislocation of right elbow    Closed fracture of right olecranon process 04/10/2020   COPD (chronic obstructive pulmonary disease) (HCC) 01/21/2020   Dermatitis 02/05/2020   Dizziness 02/19/2016   Essential hypertension 11/28/2018   History of prostate cancer    seed implant   Hyperlipemia    Hyperlipidemia,  mixed 08/21/2008   Qualifier: Diagnosis of  By: Maple Hudson MD, Clinton D    LVH (left ventricular hypertrophy) 11/28/2018   Malnutrition of moderate degree (HCC) 08/15/2019   Mixed conductive and sensorineural hearing loss, 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   Moderate persistent asthma, uncomplicated 01/21/2020   Nonsustained ventricular tachycardia (HCC) 01/30/2019   NSTEMI (non-ST  elevated myocardial infarction) (HCC) 06/21/2020   Other fatigue 02/05/2020   PAF (paroxysmal atrial fibrillation) (HCC) 06/25/2020   Seasonal and perennial allergic rhinitis 05/17/2007       Senile osteoporosis 08/15/2019   SINUSITIS, ACUTE 06/28/2007   Qualifier: Diagnosis of  By: Clent Ridges NP, Tammy     Ventricular ectopy 11/28/2018   Past Surgical History:  Past Surgical History:  Procedure Laterality Date   APPENDECTOMY     CORONARY ATHERECTOMY N/A 06/24/2020   Procedure: CORONARY ATHERECTOMY;  Surgeon: Iran Ouch, MD;  Location: [facility];  Service: Cardiovascular;  Laterality: N/A;   CORONARY ULTRASOUND/IVUS N/A 06/24/2020   Procedure: Intravascular Ultrasound/IVUS;  Surgeon: Iran Ouch, MD;  Location: [facility];  Service: Cardiovascular;  Laterality: N/A;   LEFT HEART CATH AND CORONARY ANGIOGRAPHY N/A 06/22/2020   Procedure: LEFT HEART CATH AND CORONARY ANGIOGRAPHY;  Surgeon: Yvonne Kendall, MD;  Location: [facility];  Service: Cardiovascular;  Laterality: N/A;   ORIF ELBOW FRACTURE Right 04/10/2020   Procedure: OPEN REDUCTION INTERNAL FIXATION (ORIF) ELBOW/OLECRANON FRACTURE;  Surgeon: Teryl Lucy, MD;  Location: [facility];  Service: Orthopedics;  Laterality: Right;   Angela Nevin, MA, CCC-SLP Speech Therapy

## 2022-12-28 NOTE — Progress Notes (Signed)
Speech Language Pathology Treatment: Dysphagia  Patient Details Name: Massimiliano Forbush Sr. MRN: [id number] DOB: 07/06/1931 Today's Date: 12/28/2022 Time: 4098-1191 SLP Time Calculation (min) (ACUTE ONLY): 15 min  Assessment / Plan / Recommendation Clinical Impression  Patient seen by SLP with focus on education of his daughter on MBS swallow study from this morning as well as recommendations and expectations. SLP showed daughter clip of fluoroscopy images and described patient's dysphagia as likely chronic which is exacerbated by his cognitive impairment and physical debility. SLP does expect that patient's swallow function will improve as his cognition and alertness improves. In the meantime, recommendation is to remain NPO with Cortrak feeding tube, allow ice chips/water sips PRN with SLP and RN only and to continue good oral care. SLP anticipates a repeat MBS when patient's cognition improves enough to warrant this. Daughter in agreement with this plan.    HPI HPI: Pt is an 89 y.o. male who presented with AMS after a fall. CT head: Scattered areas of subarachnoid hemorrhage in the left occipital  lobe, bilateral high frontal lobes, and right temporal lobe. Small amount of subarachnoid hemorrhage posterior to the right midbrain extending into the cerebral aqueduct. PMH: COPD, prior NSTEMI / CHF, NSVT, PAF, on ASA81.      SLP Plan  Continue with current plan of care      Recommendations for follow up therapy are one component of a multi-disciplinary discharge planning process, led by the attending physician.  Recommendations may be updated based on patient status, additional functional criteria and insurance authorization.    Recommendations  Diet recommendations: NPO Medication Administration: Via alternative means                  Oral care QID;Staff/trained caregiver to provide oral care;Oral care prior to ice chip/H20   Frequent or constant Supervision/Assistance Dysphagia,  oropharyngeal phase (R13.12)     Continue with current plan of care     Angela Nevin, MA, CCC-SLP Speech Therapy

## 2022-12-28 NOTE — Progress Notes (Signed)
Occupational Therapy Treatment Patient Details Name: James Brickhouse Sr. MRN: [id number] DOB: 1931/06/13 Today's Date: 12/28/2022   History of present illness Pt is an 89 y.o. male who presented 12/15/22 s/p fall with head trauma. CTH shows diffuse scattered traumatic pattern subarachnoid hemorrhage. Cortrak placed 8/12. PMHx includes COPD, prior NSTEMI / CHF, NSVT, PAF, anemia, bradycardia, senile osteoporosis, CAD, HLD, HTN   OT comments  Patient making incremental progress towards goals. Patient more alert in session, however only oriented to name and follows commands less than 25% of the time. Patient and OT engaging in AAROM exercises while in chair position as a precursor to OOB activities. Patient remains total A for all aspects of care. OT providing education to daughter throughout to promote carry over. OT will continue to follow, with recommendation remaining appropriate at this time.       If plan is discharge home, recommend the following:  Two people to help with walking and/or transfers;A lot of help with bathing/dressing/bathroom;Direct supervision/assist for medications management;Direct supervision/assist for financial management;Assist for transportation;Help with stairs or ramp for entrance;Supervision due to cognitive status   Equipment Recommendations  Other (comment) (defer to next venue)    Recommendations for Other Services      Precautions / Restrictions Precautions Precautions: Fall;Other (comment) Precaution Comments: cortrak; bil mitts Restrictions Weight Bearing Restrictions: No       Mobility Bed Mobility               General bed mobility comments: bed placed in chair position in order to attempt bed level exercises, minimal command following noted    Transfers                   General transfer comment: Unable to attempt     Balance Overall balance assessment: Needs assistance Sitting-balance support: Feet supported, Bilateral upper  extremity supported Sitting balance-Leahy Scale: Zero                                     ADL either performed or assessed with clinical judgement   ADL Overall ADL's : Needs assistance/impaired Eating/Feeding: NPO   Grooming: Total assistance                               Functional mobility during ADLs: Total assistance;+2 for physical assistance;+2 for safety/equipment General ADL Comments: Patient remains total A for all aspects of care despite increased level of arousal    Extremity/Trunk Assessment              Vision       Perception     Praxis      Cognition Arousal: Alert Behavior During Therapy: Flat affect Overall Cognitive Status: Impaired/Different from baseline Area of Impairment: Orientation, Attention, Memory, Following commands, Safety/judgement, Awareness, Problem solving               Rancho Levels of Cognitive Functioning Rancho Los Amigos Scales of Cognitive Functioning: Confused, Inappropriate Non-Agitated: Maximal Assistance Orientation Level: Place, Time, Situation Current Attention Level: Focused Memory: Decreased recall of precautions, Decreased short-term memory Following Commands: Follows one step commands inconsistently Safety/Judgement: Decreased awareness of safety, Decreased awareness of deficits Awareness: Intellectual Problem Solving: Slow processing, Decreased initiation, Difficulty sequencing, Requires verbal cues, Requires tactile cues General Comments: Patient more alert in session, and would orient to name only. Inconsistent command following throughout <  25%, often incoherent or nonsensical responses [email or web address] Scales of Cognitive Functioning: Confused, Inappropriate Non-Agitated: Maximal Assistance [V]      Exercises Exercises: Low Level/ICU Low Level/ICU Exercises Quad Sets: AAROM, Both, Supine, 10 reps Hip ABduction/ADduction: AAROM, Both, 15 reps, Supine Shoulder Flexion: Both,  15 reps, Supine Elbow Flexion: AAROM, 10 reps, Supine, Both    Shoulder Instructions       General Comments daughter present, OT recommending chair position and other strategies to promote alert affect and increase overall awareness    Pertinent Vitals/ Pain       Pain Assessment Pain Assessment: Faces Faces Pain Scale: Hurts a little bit Pain Location: L leg with PROM Pain Descriptors / Indicators: Discomfort, Grimacing, Guarding Pain Intervention(s): Limited activity within patient's tolerance, Monitored during session, Repositioned  Home Living                                          Prior Functioning/Environment              Frequency  Min 1X/week        Progress Toward Goals  OT Goals(current goals can now be found in the care plan section)  Progress towards OT goals: Progressing toward goals  Acute Rehab OT Goals Patient Stated Goal: with daughter: continue to progress OT Goal Formulation: Patient unable to participate in goal setting Time For Goal Achievement: 01/06/23 Potential to Achieve Goals: Poor  Plan      Co-evaluation                 AM-PAC OT "6 Clicks" Daily Activity     Outcome Measure   Help from another person eating meals?: Total (NPO) Help from another person taking care of personal grooming?: Total Help from another person toileting, which includes using toliet, bedpan, or urinal?: Total Help from another person bathing (including washing, rinsing, drying)?: Total Help from another person to put on and taking off regular upper body clothing?: Total Help from another person to put on and taking off regular lower body clothing?: Total 6 Click Score: 6    End of Session    OT Visit Diagnosis: Unsteadiness on feet (R26.81);Other symptoms and signs involving the nervous system (R29.898);Other symptoms and signs involving cognitive function   Activity Tolerance Patient limited by fatigue   Patient Left in  bed;with call bell/phone within reach;with bed alarm set;with family/visitor present   Nurse Communication Mobility status        Time: 1610-9604 OT Time Calculation (min): 25 min  Charges: OT General Charges $OT Visit: 1 Visit OT Treatments $Self Care/Home Management : 23-37 mins  Pollyann Glen E. Corry Ihnen, OTR/L Acute Rehabilitation Services [phone]   Cherlyn Cushing 12/28/2022, 11:52 AM

## 2022-12-28 NOTE — Plan of Care (Signed)

## 2022-12-28 NOTE — Progress Notes (Addendum)
Palliative Medicine Inpatient Follow Up Note HPI: [name] Sr. is a 89 y.o. male with past medical history of Nonsustained V. tach, A-fib, ischemic cardiomyopathy (HFrEF with improved EF 35% improved to 55% 2022) HTN, COPD, hypothyroidism, chronic anemia, frequent falls. S/P traumatic subarachnoid hemorrhage. Palliative care has been asked to get involved to support additional goals of care conversations.   Today's Discussion 12/28/2022  *Please note that this is a verbal dictation therefore any spelling or grammatical errors are due to the "Dragon Medical One" system interpretation.  Chart reviewed inclusive of vital signs, progress notes, laboratory results, and diagnostic images.   I met with [name] and his daughter, [name] at bedside this afternoon. He had just returned from the [facility] though it was not resulted at that time.  [name] is able to get out a sentence though mumbles otherwise. He appears to be in no notable distress.   [name] shares that [name] was able to eat some ice and apple sauce in the presence of speech therapy yesterday. She shares ongoing hope for improvement.  Plan for the primary team to review results of MBS with family. The PMT will remain involved for ongoing support.  Questions and concerns addressed.  Objective Assessment: Vital Signs Vitals:   12/28/22 1138 12/28/22 1520  BP: 113/71 115/65  Pulse: 78 96  Resp: 20 (!) 22  Temp: 99.6 F (37.6 C) 99.6 F (37.6 C)  SpO2: 99% 100%    Intake/Output Summary (Last 24 hours) at 12/28/2022 1542 Last data filed at 12/28/2022 1139 Gross per 24 hour  Intake --  Output 1450 ml  Net -1450 ml   Last Weight  Most recent update: 12/28/2022  3:11 AM    Weight  58 kg (127 lb 13.9 oz)            Gen:  Frail elderly Caucasian M in NAD HEENT: moist mucous membranes CV: Irregular rate and rhythm  PULM: On RA, breathing is even and non-labored ABD: soft/nontender  EXT: No edema  Neuro:  Somenolent  SUMMARY OF RECOMMENDATIONS   Full code --> Has a "Hard Choices" book to review. Ideally patient to clear enough to guide medical team for the future   Goals at this time are to allow time for outcomes  Plan for MBS review with Dr. Benjamine Mola tomorrow to support additional decisions moving forward   Ongoing PMT support - I will not be present tomorrow though I will request a colleague follow up  Symptom Management:  Delirium: Non-pharmacologic Delirium Precautions  - Frequent re-orientation; update board w/ date, names of treatment team  - Daytime stimulation: Open curtains, turn lights on, and turn on television as tolerated during waking hours  - Nighttime calm - close curtains, turn lights off, and turn off television at 9pm  with minimal interruptions from treatment team during sleeping hours, including avoiding lab draws if possible  - Encourage continued presence of family/friends  - Occupy w/ distractions - e.g. ask them to fold washcloths, busy-board  - Encourage movement with PT/OT as tolerated  - Ensure adequate sensorium, to include providing glasses, dentures, and hearing aids to patient as appropriate  - Ensure adequate nutrition and fluid intake  - Monitor for regular bowel movements  Billing based on MDM: Moderate ______________________________________________________________________________________ [name] [facility] Team Team Cell Phone: [phone] Please utilize secure chat with additional questions, if there is no response within 30 minutes please call the above phone number  Palliative Medicine Team providers are  available by phone from 7am to 7pm daily and can be reached through the team cell phone.  Should this patient require assistance outside of these hours, please call the patient's attending physician.

## 2022-12-28 NOTE — TOC Progression Note (Signed)
Transition of Care (TOC) - Progression Note    Patient Details  Name: [name] Sr. MRN: [id number] Date of Birth: February 12, 1932  Transition of Care [facility]) CM/SW Contact  Eduard Roux, [place] Phone Number: 12/28/2022, 4:13 PM  Clinical Narrative:     TOC continues to follow   Expected Discharge Plan:  (TBD) Barriers to Discharge: Continued Medical Work up  Expected Discharge Plan and Services                                               Social Determinants of Health (SDOH) Interventions SDOH Screenings   Food Insecurity: No Food Insecurity (12/16/2022)  Housing: Low Risk  (12/16/2022)  Transportation Needs: No Transportation Needs (12/16/2022)  Utilities: Not At Risk (12/16/2022)  Alcohol Screen: Low Risk  (07/08/2022)  Depression (PHQ2-9): Low Risk  (07/26/2022)  Financial Resource Strain: Low Risk  (03/30/2022)  Physical Activity: Insufficiently Active (01/29/2021)  Social Connections: Socially Integrated (01/29/2021)  Stress: No Stress Concern Present (01/29/2021)  Tobacco Use: Medium Risk (12/16/2022)    Readmission Risk Interventions     No data to display

## 2022-12-29 DIAGNOSIS — R1319 Other dysphagia: Secondary | ICD-10-CM | POA: Diagnosis not present

## 2022-12-29 DIAGNOSIS — Z7189 Other specified counseling: Secondary | ICD-10-CM | POA: Diagnosis not present

## 2022-12-29 DIAGNOSIS — I609 Nontraumatic subarachnoid hemorrhage, unspecified: Secondary | ICD-10-CM | POA: Diagnosis not present

## 2022-12-29 DIAGNOSIS — Z515 Encounter for palliative care: Secondary | ICD-10-CM | POA: Diagnosis not present

## 2022-12-29 LAB — GLUCOSE, CAPILLARY
Glucose-Capillary: 137 mg/dL — ABNORMAL HIGH (ref 70–99)
Glucose-Capillary: 142 mg/dL — ABNORMAL HIGH (ref 70–99)
Glucose-Capillary: 116 mg/dL — ABNORMAL HIGH (ref 70–99)
Glucose-Capillary: 140 mg/dL — ABNORMAL HIGH (ref 70–99)
Glucose-Capillary: 131 mg/dL — ABNORMAL HIGH (ref 70–99)
Glucose-Capillary: 142 mg/dL — ABNORMAL HIGH (ref 70–99)

## 2022-12-29 LAB — MAGNESIUM: Magnesium: 2.2 mg/dL (ref 1.7–2.4)

## 2022-12-29 LAB — CBC
HCT: 32.3 % — ABNORMAL LOW (ref 39.0–52.0)
Hemoglobin: 10.3 g/dL — ABNORMAL LOW (ref 13.0–17.0)
MCH: 30.9 pg (ref 26.0–34.0)
MCHC: 31.9 g/dL (ref 30.0–36.0)
MCV: 97 fL (ref 80.0–100.0)
Platelets: 406 10*3/uL — ABNORMAL HIGH (ref 150–400)
RBC: 3.33 MIL/uL — ABNORMAL LOW (ref 4.22–5.81)
RDW: 12.4 % (ref 11.5–15.5)
WBC: 14.5 10*3/uL — ABNORMAL HIGH (ref 4.0–10.5)
nRBC: 0 % (ref 0.0–0.2)

## 2022-12-29 LAB — BASIC METABOLIC PANEL
Anion gap: 7 (ref 5–15)
BUN: 22 mg/dL (ref 8–23)
CO2: 25 mmol/L (ref 22–32)
Calcium: 8.2 mg/dL — ABNORMAL LOW (ref 8.9–10.3)
Chloride: 98 mmol/L (ref 98–111)
Creatinine, Ser: 0.81 mg/dL (ref 0.61–1.24)
GFR, Estimated: >60 mL/min (ref >60)
Glucose, Bld: 140 mg/dL — ABNORMAL HIGH (ref 70–99)
Potassium: 4.6 mmol/L (ref 3.5–5.1)
Sodium: 130 mmol/L — ABNORMAL LOW (ref 135–145)

## 2022-12-29 NOTE — Progress Notes (Signed)
[facility] PROGRESS NOTE  James Vandecar Sr. (DOB: May 28, 1931) BJY:[id number]   PCP: Blane Ohara, MD    Brief Narrative: James Olaiz Sr. is an 89 y.o. male with a history of A-fib, NSVT, ischemic cardiomyopathy (NSTEMI s/p LAD stent 2022), HFrEF (LVEF improved to 55% 2022 from 35%), HTN, COPD, hypothyroidism, chronic anemia, frequent falls who presented 8/8 after a fall found to have a traumatic subarachnoid hemorrhage.   Hospitalization has been complicated by E. coli UTI, asymptomatic runs of AFib and PVCs for which cardiology was consulted, acute urinary retention for which urology was consulted, placed urinary catheter 8/12, dysphagia for which cortrak tube has been placed, and persistent encephalopathy. Palliative care is following.    Subjective: More awake this AM--- able to tell me his name  Objective: BP (!) 115/92 (BP Location: Right Arm)   Pulse 99   Temp 99.3 F (37.4 C) (Axillary)   Resp 17   Ht 6' (1.829 m)   Wt 58 kg   SpO2 98%   BMI 17.34 kg/m    In bed, will answer simple questions like name   Assessment & Plan: Traumatic subarachnoid hemorrhage: - Continuing observation per neurosurgery. Plan to repeat CT head in a couple weeks per their notes. Last CT 8/13 mentions vasogenic edema, and expected redistribution of contusions. ?of mass though wasn't present previously. Pt cannot tolerate MRI (even CT was motion degraded), so we're deferring further imaging at this time. -repeat CT scan next week  Fever, persistent leukocytosis: Isolated fever in neuro pt 8/19. No s/sxs or VTE and has been getting prophylaxis w/heparin. No respiratory symptoms and no consolidation on CXR. UA without pyuria. No cellulitis by exam. No meningismus.  - Continue to monitor blood cultures off antibiotics for now. Low threshold for empiric coverage. Leukocytosis has come down.    TBI with acute encephalopathy:  - Optimize medical and nutritional status - He did not  have significant delirium during hospitalization in 2022, so this is likely related to TBI/hemorrhage.  - Palliative following, we are giving time for outcomes  Severe protein calorie malnutrition: Body mass index is 17.34 kg/m.  - Feeding thru cortrak. Keep NPO per SLP evaluation. Will continue seeing how his clinical status progresses.    E. coli UTI: Resistant to ancef. Leukocytosis stable.  - Received ceftriaxone x7 days. This should be effective, confirmed w/ID.     Acute urinary retention, BPH, prostate CA s/p brachytherapy: - Urology consulted, placed catheter 8/12 over a wire. Will continue catheter at least 7 days.   PAF w/RVR, frequent PVCs, hx NSVT: Currently in sinus rhythm with PACs, PVCs. - Rate elevated a bit, increased metoprolol to 50mg  BID - Cardiology consulted - Continue metoprolol 5mg  IV prn  - Holding full dose anticoagulation due to fall risk and current SAH, SDH. Not a good candidate for Watchman device.   CAD s/p LAD stent 2022: elevated troponin is mild at 32, flat, without angina consistent with demand myocardial ischemia.  - Continue beta blocker, statin. Not on antiplatelet due to bleeding.   Chronic HFrEF with improved EF (35>55%, 2022): - Continue metoprolol.   - Remains euvolemic, continue osmolite 1.2 for full nutritional support, continue free water q6h.   Hypokalemia: Resolved.  - Continue monitoring  Hyponatremia:  - Decreased free water and is improving. Continue monitoring   Hypophosphatemia: Resolved with supplementation.   Chronic normocytic anemia: No bleeding, urine without gross hematuria. Deferred GI work up PTA due to frailty.  - Monitor  intermittently, remains stable.   Moderate persistent asthma, uncomplicated: No exacerbation. - Continue DuoNebs.   Acquired hypothyroidism: TSH is 6.177, free T4 is normal.  - Continue levothyroxine 175 mcg daily   Essential hypertension - Continue metoprolol   Debility, deconditioning,  frailty: At current level, would require SNF level of care. Continue mobilizing as able.    Goals of care.  Patient with guarded prognosis.  Palliative care on board.  Plan is to continue current level of care at this time.  Will need to continue reassessing   [name], DO [facility] [email or web address] 12/29/2022, 11:39 AM

## 2022-12-29 NOTE — Plan of Care (Signed)

## 2022-12-29 NOTE — Progress Notes (Signed)
Physical Therapy Treatment Patient Details Name: James Yerke Sr. MRN: [id number] DOB: [birth year] Today's Date: 12/29/2022   History of Present Illness Pt is an 89 y.o. male who presented 12/15/22 s/p fall with head trauma. CTH shows diffuse scattered traumatic pattern subarachnoid hemorrhage. Cortrak placed 8/12. PMHx includes COPD, prior NSTEMI / CHF, NSVT, PAF, anemia, bradycardia, senile osteoporosis, CAD, HLD, HTN    PT Comments  Pt received in bed, initially lethargic but roused with stimulation. Pt combative at times with mobility, trying to swing at therapist but other times agreeable. Came to EOB with max A +2 with strong hip extension in initial sitting needed max A +2 to remain seated safely. With time pt allowed flexing at hips and even leaned fwd but would maintain only a few seconds before extending again. Could not transfer to chair today due to decreased safety and confusion. Tot A to return to supine. Bed placed in chair position which pt tolerated well. Daughter present. HR 78 bpm. PT will continue to follow.     If plan is discharge home, recommend the following: Two people to help with walking and/or transfers;Two people to help with bathing/dressing/bathroom;Direct supervision/assist for financial management;Direct supervision/assist for medications management;Assistance with feeding;Help with stairs or ramp for entrance;Assist for transportation   Can travel by private vehicle     No  Equipment Recommendations  Other (comment) (to be determined)    Recommendations for Other Services Rehab consult     Precautions / Restrictions Precautions Precautions: Fall;Other (comment) Precaution Comments: cortrak; bil mitts Restrictions Weight Bearing Restrictions: No     Mobility  Bed Mobility Overal bed mobility: Needs Assistance Bed Mobility: Supine to Sit, Sit to Supine     Supine to sit: +2 for physical assistance, Total assist, HOB elevated Sit to supine: +2 for  physical assistance, Total assist   General bed mobility comments: tot A +2 needed to come to EOB and pt initially extending through hips making it diffciult to maintain sitting. At times would relax and allow self to flex fwd but then would extend again. Max A +2 needed to maintain sitting    Transfers                   General transfer comment: Unable to attempt, would not be safe for pt to be in chair today. Bed placed in chair position end of session for upright positioning    Ambulation/Gait               General Gait Details: unable at this time   Stairs             Wheelchair Mobility     Tilt Bed    Modified Rankin (Stroke Patients Only)       Balance Overall balance assessment: Needs assistance Sitting-balance support: Feet supported, Bilateral upper extremity supported Sitting balance-Leahy Scale: Zero Sitting balance - Comments: Sat EOB x 15 minutes with max assist. Pushing into extension with legs pushing out straight Postural control: Posterior lean Standing balance support: Bilateral upper extremity supported Standing balance-Leahy Scale: Zero Standing balance comment: Total A +2 and bil knees blocked with bil HHA - pt attempts to push into hip extension with positional change                            Cognition Arousal: Alert Behavior During Therapy: Flat affect Overall Cognitive Status: Impaired/Different from baseline Area of Impairment: Orientation, Attention, Memory, Following  commands, Safety/judgement, Awareness, Problem solving               Rancho Levels of Cognitive Functioning [place]igos Scales of Cognitive Functioning: Confused, Inappropriate Non-Agitated: Maximal Assistance Orientation Level: Place, Time, Situation Current Attention Level: Focused Memory: Decreased recall of precautions, Decreased short-term memory Following Commands: Follows one step commands inconsistently Safety/Judgement:  Decreased awareness of safety, Decreased awareness of deficits Awareness: Intellectual Problem Solving: Slow processing, Decreased initiation, Difficulty sequencing, Requires verbal cues, Requires tactile cues General Comments: pt alert once woken but followed commands only ~10% of time. Pt resistant at times and sometimes tried to swing at therapist   [facility] Scales of Cognitive Functioning: Confused, Inappropriate Non-Agitated: Maximal Assistance [V]    Exercises Low Level/ICU Exercises Ankle Circles/Pumps: PROM, Both, 10 reps, Seated Quad Sets: AAROM, Both, Supine, 10 reps Hip ABduction/ADduction: Both, PROM, 10 reps, Seated Heel Slides: PROM, Both, 10 reps, Supine    General Comments General comments (skin integrity, edema, etc.): mother and father present.      Pertinent Vitals/Pain Pain Assessment Pain Assessment: Faces Faces Pain Scale: Hurts a little bit Pain Location: generalized with mobility Pain Descriptors / Indicators: Discomfort, Grimacing Pain Intervention(s): Limited activity within patient's tolerance, Monitored during session, Repositioned    Home Living                          Prior Function            PT Goals (current goals can now be found in the care plan section) Acute Rehab PT Goals Patient Stated Goal: to sit up; daughter hopes for pt to improve PT Goal Formulation: With patient/family Time For Goal Achievement: 01/05/23 Potential to Achieve Goals: Good Progress towards PT goals: Not progressing toward goals - comment (lethargic and less engaged today)    Frequency    Min 1X/week      PT Plan      Co-evaluation              AM-PAC PT "6 Clicks" Mobility   Outcome Measure  Help needed turning from your back to your side while in a flat bed without using bedrails?: Total Help needed moving from lying on your back to sitting on the side of a flat bed without using bedrails?: Total Help needed moving to and  from a bed to a chair (including a wheelchair)?: Total Help needed standing up from a chair using your arms (e.g., wheelchair or bedside chair)?: Total Help needed to walk in hospital room?: Total Help needed climbing 3-5 steps with a railing? : Total 6 Click Score: 6    End of Session Equipment Utilized During Treatment: Gait belt Activity Tolerance: Patient limited by lethargy Patient left: with call bell/phone within reach;in chair;with chair alarm set;with family/visitor present Nurse Communication: Mobility status PT Visit Diagnosis: Other abnormalities of gait and mobility (R26.89);Muscle weakness (generalized) (M62.81);Other symptoms and signs involving the nervous system (R29.898);Pain Pain - part of body:  (generalized)     Time: 1131-1150 PT Time Calculation (min) (ACUTE ONLY): 19 min  Charges:    $Therapeutic Activity: 8-22 mins PT General Charges $$ ACUTE PT VISIT: 1 Visit                     Lyanne Co, PT  Acute Rehab Services Secure chat preferred Office [phone]    Lawana Chambers Sharief Wainwright 12/29/2022, 12:49 PM

## 2022-12-29 NOTE — Progress Notes (Signed)
Daily Progress Note   Patient Name: James Vantassell Sr.       Date: 12/29/2022 DOB: 1931/09/21  Age: 89 y.o. MRN#: [id number] Attending Physician: [name], DO Primary Care Physician: Blane Ohara, MD Admit Date: 12/15/2022  Reason for Consultation/Follow-up: Establishing goals of care  Subjective: Patient remains confused, unable to express himself - daughter at bedside  Length of Stay: 13  Current Medications: Scheduled Meds:   Chlorhexidine Gluconate Cloth  6 each Topical Daily   docusate  100 mg Per Tube BID   famotidine  40 mg Oral Daily   free water  100 mL Per Tube Q8H   heparin injection (subcutaneous)  5,000 Units Subcutaneous Q12H   levothyroxine  175 mcg Per Tube Q0600   loratadine  10 mg Per Tube Daily   metoprolol tartrate  50 mg Per Tube BID   mouth rinse  15 mL Mouth Rinse 4 times per day   terbinafine  250 mg Per Tube Daily    Continuous Infusions:  feeding supplement (OSMOLITE 1.2 CAL) 1,000 mL (12/29/22 0052)    PRN Meds: acetaminophen **OR** acetaminophen, albuterol, metoprolol tartrate, ondansetron **OR** ondansetron (ZOFRAN) IV, mouth rinse, polyethylene glycol, white petrolatum  Physical Exam Constitutional:      General: He is not in acute distress.    Appearance: He is ill-appearing.  Pulmonary:     Effort: Pulmonary effort is normal.  Skin:    General: Skin is warm and dry.             Vital Signs: BP (!) 115/92 (BP Location: Right Arm)   Pulse 99   Temp 99.3 F (37.4 C) (Axillary)   Resp 17   Ht 6' (1.829 m)   Wt 58 kg   SpO2 98%   BMI 17.34 kg/m  SpO2: SpO2: 98 % O2 Device: O2 Device: Room Air O2 Flow Rate: O2 Flow Rate (L/min): 2 L/min  Intake/output summary:  Intake/Output Summary (Last 24 hours) at 12/29/2022 1031 Last data filed at  12/29/2022 0300 Gross per 24 hour  Intake --  Output 1750 ml  Net -1750 ml   LBM: Last BM Date : 12/22/22 Baseline Weight: Weight: 63.5 kg Most recent weight: Weight: 58 kg       Palliative Assessment/Data: PPS 50%      Patient Active Problem List   Diagnosis Date Noted   Protein-calorie malnutrition, severe 12/19/2022   Urinary retention 12/19/2022   Frequent falls 12/17/2022   Acute metabolic encephalopathy 12/17/2022   Chronic HFrEF with improved EF(heart failure with reduced ejection fraction) (HCC) 12/17/2022   At high risk for bleeding 12/17/2022   Ischemic cardiomyopathy 12/17/2022   SIRS (systemic inflammatory response syndrome) (HCC) 12/17/2022   Hypokalemia 12/17/2022   Subarachnoid hemorrhage (HCC) 12/15/2022   History of recent fall 12/14/2022   Encounter for prostate cancer screening 07/20/2022   Acute cystitis without hematuria 07/20/2022   Urinary frequency 07/20/2022   Hoarse voice quality 11/14/2020   CAD S/P LAD stent 2022 07/07/2020   Cancer (HCC)    PAF (paroxysmal atrial fibrillation) (HCC) 06/25/2020   NSTEMI (non-ST elevated myocardial infarction) (HCC) 06/21/2020   History of prostate cancer    Other fatigue 02/05/2020  Anemia 02/05/2020   Moderate persistent asthma, uncomplicated 01/21/2020   Asthma-COPD overlap syndrome 01/21/2020   Acquired hypothyroidism 08/15/2019   Mixed conductive and sensorineural hearing loss, 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   Malnutrition of moderate degree (HCC) 08/15/2019   Senile osteoporosis 08/15/2019   Nonsustained ventricular tachycardia (HCC) 01/30/2019   Frequent PVCs 11/28/2018   Essential hypertension 11/28/2018   LVH (left ventricular hypertrophy) 11/28/2018   Hyperlipidemia, mixed 08/21/2008   SINUSITIS, ACUTE 06/28/2007   Seasonal and perennial allergic rhinitis 05/17/2007    Palliative Care Assessment & Plan   HPI: James Milone Sr. is a 89 y.o. male with past medical history of Nonsustained V. tach,  A-fib, ischemic cardiomyopathy (HFrEF with improved EF 35% improved to 55% 2022) HTN, COPD, hypothyroidism, chronic anemia, frequent falls. S/P traumatic subarachnoid hemorrhage. Palliative care has been asked to get involved to support additional goals of care conversations.   Assessment: Visited with daughter - reviewed last 24 hours. She shares she had a good discussion with SLP yesterday following MBS. She remains hopeful for improvement and would like to continue to allow time for improvements. Her understanding is that there is possibility for improvement in swallowing as mental status hopefully improves.  No changes to goals or interventions at this point. PMT will follow intermittently.   Discussed with Dr. Benjamine Mola.  Recommendations/Plan: Continue current interventions, time for outcomes Standard delirium management (adapted from NICE guidelines 2011 for prevention of delirium):  Provide continuity of care when possible (avoid frequent changing of surroundings and staff).  Frequent reorientation to time with:  A clock should always be visible.  Make sure Calendar/white board is updated.  Lights on/blinds open during the day and off/closed at night.  Encourage frequent family visits.  Monitor for and treat dehydration/constipation.  Optimize oxygen saturation.  Avoid catheters and IV's when possible and look for/treat infections.  Encourage early mobility.  Assess and treat pain  Ensure adequate nutrition and functioning dentures.  Address reversible causes of hearing and visual impairment:  Use pocket talker if hearing aids are unavailable.  Avoid sleep disturbance (normalize sleep/wake cycle).  Minimize disturbances and consider NOT obtaining vitals at night if possible.  Review Medications to avoid polypharmacy and avoid deliriogenic medications when possible:  Benzodiazepines  Dihydropyridines.  Antihistamines.  Anticholinergics   (Possibly avoid: H2 blockers, tricyclic  antidepressants, antiparkinson medications, steroids, NSAID's).   Care plan was discussed with Dr Benjamine Mola, RN, daughter Elon Jester  Thank you for allowing the Palliative Medicine Team to assist in the care of this patient.  *Please note that this is a verbal dictation therefore any spelling or grammatical errors are due to the "Dragon Medical One" system interpretation.  Gerlean Ren, DNP, [facility] Palliative Medicine Team Team Phone # [phone]  Pager [phone]

## 2022-12-29 NOTE — Progress Notes (Signed)
Speech Language Pathology Treatment: Cognitive-Linquistic  Patient Details Name: James Felland Sr. MRN: [id number] DOB: December 08, 1931 Today's Date: 12/29/2022 Time: 5621-3086 SLP Time Calculation (min) (ACUTE ONLY): 11 min  Assessment / Plan / Recommendation Clinical Impression  Pt seen resting in bed upon SLP arrival with his sister at bedside. Pt initially roused minimally, following no commands to open his eyes or open his mouth. SLP attempted to complete oral care, which he was combative towards and showed signs of oral aversion. Unable to provide thorough oral care due to pt swinging at SLP, although did remove dried secretions from pt's lips. SLP pursed lips when presented ice chip via spoon and overall, does not demonstrate a level of alertness appropriate for PO trials. Prompted pt to follow multiple simple commands and asked pt basic biographical questions with seemingly no response. Will continue attempts to target cognition and swallowing as pt's mentation continues to improve.    HPI HPI: Pt is an 89 y.o. male who presented with AMS after a fall. CT head: Scattered areas of subarachnoid hemorrhage in the left occipital  lobe, bilateral high frontal lobes, and right temporal lobe. Small amount of subarachnoid hemorrhage posterior to the right midbrain extending into the cerebral aqueduct. PMH: COPD, prior NSTEMI / CHF, NSVT, PAF, on ASA81.      SLP Plan  Continue with current plan of care      Recommendations for follow up therapy are one component of a multi-disciplinary discharge planning process, led by the attending physician.  Recommendations may be updated based on patient status, additional functional criteria and insurance authorization.    Recommendations  Diet recommendations: NPO Medication Administration: Via alternative means                  Oral care QID;Staff/trained caregiver to provide oral care;Oral care prior to ice chip/H20   Frequent or constant  Supervision/Assistance Dysphagia, oropharyngeal phase (R13.12);Cognitive communication deficit (V78.469)     Continue with current plan of care     Gwynneth Aliment, M.A., CF-SLP Speech Language Pathology, Acute Rehabilitation Services  Secure Chat preferred [phone]   12/29/2022, 1:37 PM

## 2022-12-30 DIAGNOSIS — I609 Nontraumatic subarachnoid hemorrhage, unspecified: Secondary | ICD-10-CM | POA: Diagnosis not present

## 2022-12-30 LAB — GLUCOSE, CAPILLARY
Glucose-Capillary: 117 mg/dL — ABNORMAL HIGH (ref 70–99)
Glucose-Capillary: 120 mg/dL — ABNORMAL HIGH (ref 70–99)
Glucose-Capillary: 121 mg/dL — ABNORMAL HIGH (ref 70–99)
Glucose-Capillary: 130 mg/dL — ABNORMAL HIGH (ref 70–99)
Glucose-Capillary: 133 mg/dL — ABNORMAL HIGH (ref 70–99)
Glucose-Capillary: 141 mg/dL — ABNORMAL HIGH (ref 70–99)

## 2022-12-30 MED ORDER — SENNA 8.6 MG PO TABS
1.0000 | ORAL_TABLET | Freq: Every day | ORAL | Status: DC | PRN
  Administered 2022-12-30 – 2022-12-31 (×2): 8.6 mg via ORAL
  Filled 2022-12-30 (×3): qty 1

## 2022-12-30 MED ORDER — BISACODYL 10 MG RE SUPP
10.0000 mg | Freq: Every day | RECTAL | Status: DC | PRN
  Administered 2022-12-30: 10 mg via RECTAL
  Filled 2022-12-30: qty 1

## 2022-12-30 NOTE — Plan of Care (Signed)

## 2022-12-30 NOTE — Progress Notes (Signed)
[facility] PROGRESS NOTE  James Welden Sr. (DOB: Apr 15, 1932) ZOX:[id number]   PCP: Blane Ohara, MD    Brief Narrative: James Dromgoole Sr. is an 89 y.o. male with a history of A-fib, NSVT, ischemic cardiomyopathy (NSTEMI s/p LAD stent 2022), HFrEF (LVEF improved to 55% 2022 from 35%), HTN, COPD, hypothyroidism, chronic anemia, frequent falls who presented 8/8 after a fall found to have a traumatic subarachnoid hemorrhage.   Hospitalization has been complicated by E. coli UTI, asymptomatic runs of AFib and PVCs for which cardiology was consulted, acute urinary retention for which urology was consulted, placed urinary catheter 8/12, dysphagia for which cortrak tube has been placed, and persistent encephalopathy. Palliative care is following.    Subjective: Awake and dancing this AM  Objective: BP 121/76   Pulse 89   Temp 97.7 F (36.5 C) (Axillary)   Resp 16   Ht 6' (1.829 m)   Wt 58 kg   SpO2 98%   BMI 17.34 kg/m     General: Appearance:    Thin male in no acute distress     Lungs:     respirations unlabored  Heart:    Normal heart rate.   MS:   All extremities are intact.   Neurologic:   Awake, alert      Assessment & Plan: Traumatic subarachnoid hemorrhage: - Continuing observation per neurosurgery. Plan to repeat CT head in a couple weeks per their notes. Last CT 8/13 mentions vasogenic edema, and expected redistribution of contusions. ?of mass though wasn't present previously. Pt cannot tolerate MRI (even CT was motion degraded), so we're deferring further imaging at this time. -repeat CT scan next week  Fever, persistent leukocytosis: Isolated fever in neuro pt 8/19. No s/sxs or VTE and has been getting prophylaxis w/heparin. No respiratory symptoms and no consolidation on CXR. UA without pyuria. No cellulitis by exam. No meningismus.  - Continue to monitor blood cultures off antibiotics for now. Low threshold for empiric coverage. Leukocytosis has come  down.    TBI with acute encephalopathy:  - Optimize medical and nutritional status - He did not have significant delirium during hospitalization in 2022, so this is likely related to TBI/hemorrhage.  - Palliative following, we are giving time for outcomes  Severe protein calorie malnutrition: Body mass index is 17.34 kg/m.  - Feeding thru cortrak. Keep NPO per SLP evaluation. Will continue seeing how his clinical status progresses.    E. coli UTI: Resistant to ancef. Leukocytosis stable.  - Received ceftriaxone x7 days. This should be effective, confirmed w/ID.     Acute urinary retention, BPH, prostate CA s/p brachytherapy: - Urology consulted, placed catheter 8/12 over a wire. Will continue catheter at least 7 days.   PAF w/RVR, frequent PVCs, hx NSVT: Currently in sinus rhythm with PACs, PVCs. - Rate elevated a bit, increased metoprolol to 50mg  BID - Cardiology consulted - Continue metoprolol 5mg  IV prn  - Holding full dose anticoagulation due to fall risk and current SAH, SDH. Not a good candidate for Watchman device.   CAD s/p LAD stent 2022: elevated troponin is mild at 32, flat, without angina consistent with demand myocardial ischemia.  - Continue beta blocker, statin. Not on antiplatelet due to bleeding.   Chronic HFrEF with improved EF (35>55%, 2022): - Continue metoprolol.   - Remains euvolemic, continue osmolite 1.2 for full nutritional support, continue free water q6h.   Hypokalemia: Resolved.  - Continue monitoring  Hyponatremia:  - Decreased free water and  is improving. Continue monitoring   Hypophosphatemia: Resolved with supplementation.   Chronic normocytic anemia: No bleeding, urine without gross hematuria. Deferred GI work up PTA due to frailty.  - Monitor intermittently, remains stable.   Moderate persistent asthma, uncomplicated: No exacerbation. - Continue DuoNebs.   Acquired hypothyroidism: TSH is 6.177, free T4 is normal.  - Continue  levothyroxine 175 mcg daily   Essential hypertension - Continue metoprolol   Debility, deconditioning, frailty: At current level, would require SNF level of care. Continue mobilizing as able.    Goals of care.  Monitoring for response  James Art, DO [facility] [email or web address] 12/30/2022, 11:29 AM

## 2022-12-31 DIAGNOSIS — I609 Nontraumatic subarachnoid hemorrhage, unspecified: Secondary | ICD-10-CM | POA: Diagnosis not present

## 2022-12-31 LAB — CBC
HCT: 32.8 % — ABNORMAL LOW (ref 39.0–52.0)
Hemoglobin: 10.6 g/dL — ABNORMAL LOW (ref 13.0–17.0)
MCH: 30.7 pg (ref 26.0–34.0)
MCHC: 32.3 g/dL (ref 30.0–36.0)
MCV: 95.1 fL (ref 80.0–100.0)
Platelets: 331 10*3/uL (ref 150–400)
RBC: 3.45 MIL/uL — ABNORMAL LOW (ref 4.22–5.81)
RDW: 12.6 % (ref 11.5–15.5)
WBC: 13 10*3/uL — ABNORMAL HIGH (ref 4.0–10.5)
nRBC: 0 % (ref 0.0–0.2)

## 2022-12-31 LAB — BASIC METABOLIC PANEL
Anion gap: 10 (ref 5–15)
BUN: 20 mg/dL (ref 8–23)
CO2: 21 mmol/L — ABNORMAL LOW (ref 22–32)
Calcium: 8.5 mg/dL — ABNORMAL LOW (ref 8.9–10.3)
Chloride: 99 mmol/L (ref 98–111)
Creatinine, Ser: 0.58 mg/dL — ABNORMAL LOW (ref 0.61–1.24)
GFR, Estimated: >60 mL/min (ref >60)
Glucose, Bld: 117 mg/dL — ABNORMAL HIGH (ref 70–99)
Potassium: 4.4 mmol/L (ref 3.5–5.1)
Sodium: 130 mmol/L — ABNORMAL LOW (ref 135–145)

## 2022-12-31 LAB — CULTURE, BLOOD (ROUTINE X 2)
Culture: NO GROWTH
Culture: NO GROWTH
Special Requests: ADEQUATE
Special Requests: ADEQUATE

## 2022-12-31 LAB — GLUCOSE, CAPILLARY
Glucose-Capillary: 127 mg/dL — ABNORMAL HIGH (ref 70–99)
Glucose-Capillary: 121 mg/dL — ABNORMAL HIGH (ref 70–99)
Glucose-Capillary: 119 mg/dL — ABNORMAL HIGH (ref 70–99)
Glucose-Capillary: 123 mg/dL — ABNORMAL HIGH (ref 70–99)
Glucose-Capillary: 118 mg/dL — ABNORMAL HIGH (ref 70–99)

## 2022-12-31 MED ORDER — FREE WATER
100.0000 mL | Freq: Two times a day (BID) | Status: DC
  Administered 2022-12-31 – 2023-01-09 (×16): 100 mL

## 2022-12-31 NOTE — Progress Notes (Signed)
[facility] PROGRESS NOTE  James Tomaso Sr. (DOB: Apr 19, 1932) ZOX:[id number]   PCP: Blane Ohara, MD    Brief Narrative: James Bechtle Sr. is an 89 y.o. male with a history of A-fib, NSVT, ischemic cardiomyopathy (NSTEMI s/p LAD stent 2022), HFrEF (LVEF improved to 55% 2022 from 35%), HTN, COPD, hypothyroidism, chronic anemia, frequent falls who presented 8/8 after a fall found to have a traumatic subarachnoid hemorrhage.   Hospitalization has been complicated by E. coli UTI, asymptomatic runs of AFib and PVCs for which cardiology was consulted, acute urinary retention for which urology was consulted, placed urinary catheter 8/12, dysphagia for which cortrak tube has been placed, and persistent encephalopathy. Palliative care is following.  SLP following  Subjective: Family not at bedside and patient not as interactive this AM  Objective: BP 128/69 (BP Location: Left Arm)   Pulse 70   Temp 97.6 F (36.4 C) (Axillary)   Resp 15   Ht 6' (1.829 m)   Wt (P) 55.8 kg   SpO2 98%   BMI (P) 16.68 kg/m     General: Appearance:    Thin male in no acute distress     Lungs:     respirations unlabored  Heart:    Normal heart rate.   MS:   All extremities are intact.   Neurologic:   Awake, alert      Assessment & Plan: Traumatic subarachnoid hemorrhage: - Continuing observation per neurosurgery. Plan to repeat CT head in a couple weeks per their notes. Last CT 8/13 mentions vasogenic edema, and expected redistribution of contusions. ?of mass though wasn't present previously. Pt cannot tolerate MRI (even CT was motion degraded), so we're deferring further imaging at this time. -repeat CT scan next week  Fever, persistent leukocytosis: Isolated fever in neuro pt 8/19. No s/sxs or VTE and has been getting prophylaxis w/heparin. No respiratory symptoms and no consolidation on CXR. UA without pyuria. No cellulitis by exam. No meningismus.  - Continue to monitor blood cultures  off antibiotics for now. Low threshold for empiric coverage. Leukocytosis has come down.    TBI with acute encephalopathy:  - Optimize medical and nutritional status - He did not have significant delirium during hospitalization in 2022, so this is likely related to TBI/hemorrhage.  - Palliative following, we are giving time for outcomes  Severe protein calorie malnutrition: Body mass index is 16.68 kg/m (pended).  - Feeding thru cortrak. Keep NPO per SLP evaluation. Will continue seeing how his clinical status progresses.    E. coli UTI: Resistant to ancef. Leukocytosis stable.  - Received ceftriaxone x7 days. This should be effective, confirmed w/ID.     Acute urinary retention, BPH, prostate CA s/p brachytherapy: - Urology consulted, placed catheter 8/12 over a wire. Will continue catheter at least 7 days.   PAF w/RVR, frequent PVCs, hx NSVT: Currently in sinus rhythm with PACs, PVCs. - Rate elevated a bit, increased metoprolol to 50mg  BID - Cardiology consulted - Continue metoprolol 5mg  IV prn  - Holding full dose anticoagulation due to fall risk and current SAH, SDH. Not a good candidate for Watchman device.   CAD s/p LAD stent 2022: elevated troponin is mild at 32, flat, without angina consistent with demand myocardial ischemia.  - Continue beta blocker, statin. Not on antiplatelet due to bleeding.   Chronic HFrEF with improved EF (35>55%, 2022): - Continue metoprolol.   - Remains euvolemic, continue osmolite 1.2 for full nutritional support, continue free water q6h.  Hypokalemia: Resolved.  - Continue monitoring  Hyponatremia:  - Decreased free water and is improving. Continue monitoring   Hypophosphatemia: Resolved with supplementation.   Chronic normocytic anemia: No bleeding, urine without gross hematuria. Deferred GI work up PTA due to frailty.  - Monitor intermittently, remains stable.   Moderate persistent asthma, uncomplicated: No exacerbation. - Continue  DuoNebs.   Acquired hypothyroidism: TSH is 6.177, free T4 is normal.  - Continue levothyroxine 175 mcg daily   Essential hypertension - Continue metoprolol   Debility, deconditioning, frailty: At current level, would require SNF level of care. Continue mobilizing as able.    Goals of care.  Monitoring for response Delirium precautions  Constipation -bowel regimen   [name], DO [facility] [email or web address] 12/31/2022, 11:28 AM

## 2023-01-01 ENCOUNTER — Inpatient Hospital Stay (HOSPITAL_COMMUNITY): Payer: Medicare HMO

## 2023-01-01 DIAGNOSIS — Z7189 Other specified counseling: Secondary | ICD-10-CM | POA: Diagnosis not present

## 2023-01-01 DIAGNOSIS — Z515 Encounter for palliative care: Secondary | ICD-10-CM | POA: Diagnosis not present

## 2023-01-01 DIAGNOSIS — I609 Nontraumatic subarachnoid hemorrhage, unspecified: Secondary | ICD-10-CM | POA: Diagnosis not present

## 2023-01-01 LAB — CBC
HCT: 33.8 % — ABNORMAL LOW (ref 39.0–52.0)
Hemoglobin: 10.9 g/dL — ABNORMAL LOW (ref 13.0–17.0)
MCH: 30.3 pg (ref 26.0–34.0)
MCHC: 32.2 g/dL (ref 30.0–36.0)
MCV: 93.9 fL (ref 80.0–100.0)
Platelets: 530 10*3/uL — ABNORMAL HIGH (ref 150–400)
RBC: 3.6 MIL/uL — ABNORMAL LOW (ref 4.22–5.81)
RDW: 12.5 % (ref 11.5–15.5)
WBC: 14 10*3/uL — ABNORMAL HIGH (ref 4.0–10.5)
nRBC: 0 % (ref 0.0–0.2)

## 2023-01-01 LAB — BASIC METABOLIC PANEL
Anion gap: 9 (ref 5–15)
BUN: 24 mg/dL — ABNORMAL HIGH (ref 8–23)
CO2: 20 mmol/L — ABNORMAL LOW (ref 22–32)
Calcium: 8.4 mg/dL — ABNORMAL LOW (ref 8.9–10.3)
Chloride: 100 mmol/L (ref 98–111)
Creatinine, Ser: 0.75 mg/dL (ref 0.61–1.24)
GFR, Estimated: >60 mL/min (ref >60)
Glucose, Bld: 115 mg/dL — ABNORMAL HIGH (ref 70–99)
Potassium: 4.5 mmol/L (ref 3.5–5.1)
Sodium: 129 mmol/L — ABNORMAL LOW (ref 135–145)

## 2023-01-01 LAB — GLUCOSE, CAPILLARY
Glucose-Capillary: 111 mg/dL — ABNORMAL HIGH (ref 70–99)
Glucose-Capillary: 142 mg/dL — ABNORMAL HIGH (ref 70–99)
Glucose-Capillary: 144 mg/dL — ABNORMAL HIGH (ref 70–99)
Glucose-Capillary: 135 mg/dL — ABNORMAL HIGH (ref 70–99)
Glucose-Capillary: 124 mg/dL — ABNORMAL HIGH (ref 70–99)
Glucose-Capillary: 142 mg/dL — ABNORMAL HIGH (ref 70–99)

## 2023-01-01 NOTE — Progress Notes (Signed)
Daily Progress Note   Patient Name: James Kaba Sr.       Date: 01/01/2023 DOB: [birth year]  Age: 89 y.o. MRN#: [id number] Attending Physician: [name], DO Primary Care Physician: Blane Ohara, MD Admit Date: 12/15/2022  Reason for Consultation/Follow-up: Establishing goals of care  Subjective: Patient was sleeping in bed in NAD. Patient's daughter [name] was at bedside.  Length of Stay: 16  Current Medications: Scheduled Meds:   Chlorhexidine Gluconate Cloth  6 each Topical Daily   docusate  100 mg Per Tube BID   famotidine  40 mg Oral Daily   free water  100 mL Per Tube Q12H   heparin injection (subcutaneous)  5,000 Units Subcutaneous Q12H   levothyroxine  175 mcg Per Tube Q0600   loratadine  10 mg Per Tube Daily   metoprolol tartrate  50 mg Per Tube BID   mouth rinse  15 mL Mouth Rinse 4 times per day   terbinafine  250 mg Per Tube Daily    Continuous Infusions:  feeding supplement (OSMOLITE 1.2 CAL) 1,000 mL (01/01/23 1025)    PRN Meds: acetaminophen **OR** acetaminophen, albuterol, bisacodyl, metoprolol tartrate, ondansetron **OR** ondansetron (ZOFRAN) IV, mouth rinse, polyethylene glycol, senna, white petrolatum  Physical Exam Constitutional:      General: He is sleeping.     Appearance: He is ill-appearing.     Comments: cortrak  Pulmonary:     Effort: Pulmonary effort is normal.  Skin:    General: Skin is warm and dry.             Vital Signs: BP 115/69 (BP Location: Left Arm)   Pulse 76   Temp 98.6 F (37 C) (Axillary)   Resp (!) 21   Ht 6' (1.829 m)   Wt 55.8 kg   SpO2 98%   BMI 16.68 kg/m  SpO2: SpO2: 98 % O2 Device: O2 Device: Room Air O2 Flow Rate: O2 Flow Rate (L/min): 2 L/min        Patient Active Problem List   Diagnosis  Date Noted   Protein-calorie malnutrition, severe 12/19/2022   Urinary retention 12/19/2022   Frequent falls 12/17/2022   Acute metabolic encephalopathy 12/17/2022   Chronic HFrEF with improved EF(heart failure with reduced ejection fraction) (HCC) 12/17/2022   At high risk for bleeding 12/17/2022  Ischemic cardiomyopathy 12/17/2022   SIRS (systemic inflammatory response syndrome) (HCC) 12/17/2022   Hypokalemia 12/17/2022   Subarachnoid hemorrhage (HCC) 12/15/2022   History of recent fall 12/14/2022   Encounter for prostate cancer screening 07/20/2022   Acute cystitis without hematuria 07/20/2022   Urinary frequency 07/20/2022   Hoarse voice quality 11/14/2020   CAD S/P LAD stent 2022 07/07/2020   Cancer (HCC)    PAF (paroxysmal atrial fibrillation) (HCC) 06/25/2020   NSTEMI (non-ST elevated myocardial infarction) (HCC) 06/21/2020   History of prostate cancer    Other fatigue 02/05/2020   Anemia 02/05/2020   Moderate persistent asthma, uncomplicated 01/21/2020   Asthma-COPD overlap syndrome 01/21/2020   Acquired hypothyroidism 08/15/2019   Mixed conductive and sensorineural hearing loss, 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   Malnutrition of moderate degree (HCC) 08/15/2019   Senile osteoporosis 08/15/2019   Nonsustained ventricular tachycardia (HCC) 01/30/2019   Frequent PVCs 11/28/2018   Essential hypertension 11/28/2018   LVH (left ventricular hypertrophy) 11/28/2018   Hyperlipidemia, mixed 08/21/2008   SINUSITIS, ACUTE 06/28/2007   Seasonal and perennial allergic rhinitis 05/17/2007    Palliative Care Assessment & Plan   Patient Profile: [name] Sr. is a 89 y.o. male with past medical history of Nonsustained V. tach, A-fib, ischemic cardiomyopathy (HFrEF with improved EF 35% improved to 55% 2022) HTN, COPD, hypothyroidism, chronic anemia, frequent falls. S/P traumatic subarachnoid hemorrhage. Palliative care has been asked to get involved to support additional goals of care  conversations.   Assessment: Extensive chart review has been completed prior to meeting with patient's daughter including labs, vital signs, imaging, progress/consult notes, orders, medications and available advance directive documents.   Patient unable to participate in conversation. Met with daughter [name]. We discussed the patient's function before this admission and his hospital course. She tells me that she and her family have been trying to figure out anticipatory needs at discharge. I encouraged her to step back and also look at the immediate concerns/ needs. We discuss her father's continued trouble swallowing. She understandably became tearful when we discussed the seriousness of dysphagia and how it impacts prognosis. I encouraged her to start thinking about whether her father would want artificial nutrition on a long-term basis as this may be a consideration moving forward.   She notes that they do have ACP documents at home but she has not brought them to the hospital yet. I encouraged her to look at these to see what her father's wishes were.  Discussed the importance of continued conversation with family and the medical providers regarding overall plan of care and treatment options, ensuring decisions are within the context of the patient's values and GOCs.   Questions and concerns were addressed. The family was encouraged to call with questions or concerns. PMT will continue to support holistically.  Recommendations/Plan: Full code Allow time for outcomes Continued conversation with family re: goals of care  Code Status:    Code Status Orders  (From admission, onward)           Start     Ordered   12/15/22 2202  Full code  Continuous       Question:  By:  Answer:  Other   12/15/22 2204            Care plan was discussed with bedside RN and Dr. Benjamine Mola  Time spent: 50 minutes  Thank you for allowing the Palliative Medicine Team to assist in the care of this  patient.   Sherryll Burger, NP  Please contact Palliative Medicine Team phone at [phone] for questions and concerns.

## 2023-01-01 NOTE — Progress Notes (Addendum)
[facility] PROGRESS NOTE  James Bevans Sr. (DOB: January 07, 1932) ZOX:[id number]   PCP: Blane Ohara, MD    Brief Narrative: James Dobis Sr. is an 89 y.o. male with a history of A-fib, NSVT, ischemic cardiomyopathy (NSTEMI s/p LAD stent 2022), HFrEF (LVEF improved to 55% 2022 from 35%), HTN, COPD, hypothyroidism, chronic anemia, frequent falls who presented 8/8 after a fall found to have a traumatic subarachnoid hemorrhage.   Hospitalization has been complicated by E. coli UTI, asymptomatic runs of AFib and PVCs for which cardiology was consulted, acute urinary retention for which urology was consulted, placed urinary catheter 8/12, dysphagia for which cortrak tube has been placed, and persistent encephalopathy. Palliative care is following.  SLP following.   Subjective: Sleepy this AM (family not at bedside)  Objective: BP 136/76 (BP Location: Left Arm)   Pulse 79   Temp 97.7 F (36.5 C) (Axillary)   Resp (!) 27   Ht 6' (1.829 m)   Wt 55.8 kg   SpO2 97%   BMI 16.68 kg/m      General: Appearance:    Thin male in no acute distress     Lungs:     respirations unlabored  Heart:    Normal heart rate. irr   MS:   All extremities are intact.   Neurologic:   Will awaken, not as interactive      Assessment & Plan: Traumatic subarachnoid hemorrhage: - Continuing observation per neurosurgery. Plan to repeat CT head in a couple weeks per their notes. Last CT 8/13 mentions vasogenic edema, and expected redistribution of contusions. ?of mass though wasn't present previously. Pt cannot tolerate MRI (even CT was motion degraded), so we're deferring further imaging at this time. -repeat CT scan next week  Fever, persistent leukocytosis: Isolated fever in neuro pt 8/19. No s/sxs or VTE and has been getting prophylaxis w/heparin. -check x ray. UA without pyuria. No cellulitis by exam. No meningismus.  - Continue to monitor blood cultures off antibiotics for now. Low threshold  for empiric coverage -trend CBC    TBI with acute encephalopathy:  - Optimize medical and nutritional status - He did not have significant delirium during hospitalization in 2022, so this is likely related to TBI/hemorrhage.  - Palliative following, we are giving time for outcomes  Severe protein calorie malnutrition: Body mass index is 16.68 kg/m.  - Feeding thru cortrak. Keep NPO per SLP evaluation. Will continue seeing how his clinical status progresses.    E. coli UTI: Resistant to ancef. Leukocytosis stable.  - Received ceftriaxone x7 days. This should be effective, confirmed w/ID.     Acute urinary retention, BPH, prostate CA s/p brachytherapy: - Urology consulted, placed catheter 8/12 over a wire. Will continue catheter at least 7 days.   PAF w/RVR, frequent PVCs, hx NSVT: Currently in sinus rhythm with PACs, PVCs. - Rate elevated a bit, increased metoprolol to 50mg  BID - Cardiology consulted - Continue metoprolol 5mg  IV prn  - Holding full dose anticoagulation due to fall risk and current SAH, SDH. Not a good candidate for Watchman device.   CAD s/p LAD stent 2022: elevated troponin is mild at 32, flat, without angina consistent with demand myocardial ischemia.  - Continue beta blocker, statin. Not on antiplatelet due to bleeding.   Chronic HFrEF with improved EF (35>55%, 2022): - Continue metoprolol.   - Remains euvolemic, continue osmolite 1.2 for full nutritional support, continue free water q6h.   Hypokalemia: Resolved.  - Continue monitoring  Hyponatremia:  - Decreased free water -- Continue monitoring   Hypophosphatemia: Resolved with supplementation.   Chronic normocytic anemia: No bleeding, urine without gross hematuria. Deferred GI work up PTA due to frailty.  - Monitor intermittently, remains stable.   Moderate persistent asthma, uncomplicated: No exacerbation. - Continue DuoNebs.   Acquired hypothyroidism: TSH is 6.177, free T4 is normal.  -  Continue levothyroxine 175 mcg daily   Essential hypertension - Continue metoprolol   Debility, deconditioning, frailty: At current level, would require SNF level of care. Continue mobilizing as able.    Goals of care.  Monitoring for response Delirium precautions  Constipation -bowel regimen   [name], DO [facility] [email or web address] 01/01/2023, 8:56 AM

## 2023-01-02 ENCOUNTER — Inpatient Hospital Stay (HOSPITAL_COMMUNITY): Payer: Medicare HMO

## 2023-01-02 DIAGNOSIS — I609 Nontraumatic subarachnoid hemorrhage, unspecified: Secondary | ICD-10-CM | POA: Diagnosis not present

## 2023-01-02 DIAGNOSIS — Z515 Encounter for palliative care: Secondary | ICD-10-CM | POA: Diagnosis not present

## 2023-01-02 LAB — GLUCOSE, CAPILLARY
Glucose-Capillary: 122 mg/dL — ABNORMAL HIGH (ref 70–99)
Glucose-Capillary: 130 mg/dL — ABNORMAL HIGH (ref 70–99)
Glucose-Capillary: 166 mg/dL — ABNORMAL HIGH (ref 70–99)
Glucose-Capillary: 128 mg/dL — ABNORMAL HIGH (ref 70–99)
Glucose-Capillary: 127 mg/dL — ABNORMAL HIGH (ref 70–99)
Glucose-Capillary: 134 mg/dL — ABNORMAL HIGH (ref 70–99)

## 2023-01-02 LAB — CBC
HCT: 36.3 % — ABNORMAL LOW (ref 39.0–52.0)
Hemoglobin: 11.4 g/dL — ABNORMAL LOW (ref 13.0–17.0)
MCH: 30.5 pg (ref 26.0–34.0)
MCHC: 31.4 g/dL (ref 30.0–36.0)
MCV: 97.1 fL (ref 80.0–100.0)
Platelets: 508 10*3/uL — ABNORMAL HIGH (ref 150–400)
RBC: 3.74 MIL/uL — ABNORMAL LOW (ref 4.22–5.81)
RDW: 12.5 % (ref 11.5–15.5)
WBC: 14.3 10*3/uL — ABNORMAL HIGH (ref 4.0–10.5)
nRBC: 0 % (ref 0.0–0.2)

## 2023-01-02 LAB — BASIC METABOLIC PANEL
Anion gap: 12 (ref 5–15)
BUN: 23 mg/dL (ref 8–23)
CO2: 22 mmol/L (ref 22–32)
Calcium: 8.7 mg/dL — ABNORMAL LOW (ref 8.9–10.3)
Chloride: 99 mmol/L (ref 98–111)
Creatinine, Ser: 0.65 mg/dL (ref 0.61–1.24)
GFR, Estimated: >60 mL/min (ref >60)
Glucose, Bld: 120 mg/dL — ABNORMAL HIGH (ref 70–99)
Potassium: 4.8 mmol/L (ref 3.5–5.1)
Sodium: 133 mmol/L — ABNORMAL LOW (ref 135–145)

## 2023-01-02 NOTE — Progress Notes (Signed)
[facility] PROGRESS NOTE  Hilton Sofield Sr. (DOB: 1932-01-16) DGU:[id number]   PCP: Blane Ohara, MD    Brief Narrative: Albeiro Perales Sr. is an 89 y.o. male with a history of A-fib, NSVT, ischemic cardiomyopathy (NSTEMI s/p LAD stent 2022), HFrEF (LVEF improved to 55% 2022 from 35%), HTN, COPD, hypothyroidism, chronic anemia, frequent falls who presented 8/8 after a fall found to have a traumatic subarachnoid hemorrhage.   Hospitalization has been complicated by E. coli UTI, asymptomatic runs of AFib and PVCs for which cardiology was consulted, acute urinary retention for which urology was consulted, placed urinary catheter 8/12, dysphagia for which cortrak tube has been placed, and persistent encephalopathy. Palliative care is following.  SLP following.   Subjective: Awake and interacting with RN  Objective: BP 119/69 (BP Location: Left Arm)   Pulse 70   Temp 98 F (36.7 C) (Axillary)   Resp (!) 23   Ht 6' (1.829 m)   Wt 55.8 kg   SpO2 97%   BMI 16.68 kg/m       General: Appearance:    Thin male in no acute distress     Lungs:      respirations unlabored  Heart:    Normal heart rate.    MS:   All extremities are intact.   Neurologic:   Awake, alert      Assessment & Plan: Traumatic subarachnoid hemorrhage: - Continuing observation per neurosurgery. Plan to repeat CT head in a couple weeks per their notes. Last CT 8/13 mentions vasogenic edema, and expected redistribution of contusions. ?of mass though wasn't present previously. Pt cannot tolerate MRI (even CT was motion degraded), so we're deferring further imaging at this time. -repeat CT scan 8/26 shows improvement  Fever, persistent leukocytosis: Isolated fever in neuro pt 8/19. No s/sxs or VTE and has been getting prophylaxis w/heparin. -check x ray normal. UA without pyuria. No cellulitis by exam. No meningismus.  - Continue to monitor blood cultures off antibiotics for now. Low threshold for  empiric coverage -trend CBC    TBI with acute encephalopathy:  - Optimize medical and nutritional status - He did not have significant delirium during hospitalization in 2022, so this is likely related to TBI/hemorrhage.  - Palliative following, we are giving time for outcomes  Severe protein calorie malnutrition: Body mass index is 16.68 kg/m.  - Feeding thru cortrak. Keep NPO per SLP evaluation. Will continue seeing how his clinical status progresses.    E. coli UTI: Resistant to ancef. Leukocytosis stable.  - Received ceftriaxone x7 days. This should be effective, confirmed w/ID.     Acute urinary retention, BPH, prostate CA s/p brachytherapy: - Urology consulted, placed catheter 8/12 over a wire. Will continue catheter at least 7 days.   PAF w/RVR, frequent PVCs, hx NSVT: Currently in sinus rhythm with PACs, PVCs. - Rate elevated a bit, increased metoprolol to 50mg  BID - Cardiology consulted - Continue metoprolol 5mg  IV prn  - Holding full dose anticoagulation due to fall risk and current SAH, SDH. Not a good candidate for Watchman device.   CAD s/p LAD stent 2022: elevated troponin is mild at 32, flat, without angina consistent with demand myocardial ischemia.  - Continue beta blocker, statin. Not on antiplatelet due to bleeding.   Chronic HFrEF with improved EF (35>55%, 2022): - Continue metoprolol.   - Remains euvolemic, continue osmolite 1.2 for full nutritional support, continue free water q6h.   Hypokalemia: Resolved.  - Continue monitoring  Hyponatremia:  -  Decreased free water -- Continue monitoring   Hypophosphatemia: Resolved with supplementation.   Chronic normocytic anemia: No bleeding, urine without gross hematuria. Deferred GI work up PTA due to frailty.  - Monitor intermittently, remains stable.   Moderate persistent asthma, uncomplicated: No exacerbation. - Continue DuoNebs.   Acquired hypothyroidism: TSH is 6.177, free T4 is normal.  - Continue  levothyroxine 175 mcg daily   Essential hypertension - Continue metoprolol   Debility, deconditioning, frailty: At current level, would require SNF level of care. Continue mobilizing as able.    Goals of care.  Monitoring for response Delirium precautions  Constipation -bowel regimen   [name], DO [facility] [email or web address] 01/02/2023, 11:29 AM

## 2023-01-02 NOTE — Progress Notes (Signed)
Physical Therapy Treatment Patient Details Name: James Jubb Sr. MRN: [id number] DOB: 31-Aug-1931 Today's Date: 01/02/2023   History of Present Illness Pt is an 89 y.o. male who presented 12/15/22 s/p fall with head trauma. CTH shows diffuse scattered traumatic pattern subarachnoid hemorrhage. Cortrak placed 8/12. PMHx includes COPD, prior NSTEMI / CHF, NSVT, PAF, anemia, bradycardia, senile osteoporosis, CAD, HLD, HTN    PT Comments  Pt in bed, daughter and wife present. Pt soiled of stool, rolled bilaterally with mod A for cleanup, pt resistant to movement and clean up. Pt initiated partial sit to EOB but needed max A to complete move. Pt able to sit EOB at times with min A but then pushes posterior and requires max A to maintain sitting. Pt unsafe to be in recliner with this mvmt pattern. Pt not following commands with verbal or tactile cues and at times is combative. Bed placed in chair position after return to supine. PT will continue to follow.     If plan is discharge home, recommend the following: Two people to help with walking and/or transfers;Two people to help with bathing/dressing/bathroom;Direct supervision/assist for financial management;Direct supervision/assist for medications management;Assistance with feeding;Help with stairs or ramp for entrance;Assist for transportation   Can travel by private vehicle     No  Equipment Recommendations  Other (comment) (to be determined)    Recommendations for Other Services       Precautions / Restrictions Precautions Precautions: Fall;Other (comment) Precaution Comments: cortrak; bil mitts Restrictions Weight Bearing Restrictions: No     Mobility  Bed Mobility Overal bed mobility: Needs Assistance Bed Mobility: Supine to Sit, Sit to Supine Rolling: Mod assist   Supine to sit: +2 for physical assistance, Max assist Sit to supine: +2 for physical assistance, Total assist   General bed mobility comments: Rolled bilaterally  for clean up of BM with mod A. Pt initiating coming to sitting EOB but then began to resist so needed max A +2 to bring trunk fwd. Intermittently tolerating sitting EOB and then pushing strong bkwds. Continues to be unsafe to get to chair because of this. Placed bed in chair position end of session for upright posture and safety.    Transfers Overall transfer level: Needs assistance                 General transfer comment: cued pt to attempt sit>stand with tactile cues and manual facilitation but pt not participating with this so discontinued    Ambulation/Gait               General Gait Details: unable at this time   Stairs             Wheelchair Mobility     Tilt Bed    Modified Rankin (Stroke Patients Only)       Balance Overall balance assessment: Needs assistance Sitting-balance support: Feet supported, Bilateral upper extremity supported Sitting balance-Leahy Scale: Poor Sitting balance - Comments: tolerated sitting ~10 mins with min A times, max A when pushing bkwds Postural control: Posterior lean                                  Cognition Arousal: Alert Behavior During Therapy: Flat affect Overall Cognitive Status: Impaired/Different from baseline Area of Impairment: Orientation, Attention, Memory, Following commands, Safety/judgement, Awareness, Problem solving               Rancho Levels of Cognitive  Functioning Ranch[place] Mirant Scales of Cognitive Functioning: Confused/Agitated: Maximal Assistance Orientation Level: Place, Time, Situation Current Attention Level: Focused Memory: Decreased recall of precautions, Decreased short-term memory Following Commands: Follows one step commands inconsistently Safety/Judgement: Decreased awareness of safety, Decreased awareness of deficits Awareness: Intellectual Problem Solving: Slow processing, Decreased initiation, Difficulty sequencing, Requires verbal cues, Requires tactile  cues General Comments: pt following commands very little, moving self independently in bed at times. Very strong when resisting mvmt   Rancho Mirant Scales of Cognitive Functioning: Confused/Agitated: Maximal Assistance [IV]    Exercises      General Comments General comments (skin integrity, edema, etc.): daughter and wife present initially      Pertinent Vitals/Pain Pain Assessment Pain Assessment: Faces Faces Pain Scale: Hurts little more Pain Location: holding stomach in sitting, had just had a large BM Pain Descriptors / Indicators: Discomfort, Grimacing Pain Intervention(s): Limited activity within patient's tolerance, Monitored during session, Repositioned    Home Living                          Prior Function            PT Goals (current goals can now be found in the care plan section) Acute Rehab PT Goals Patient Stated Goal: family hopes for pt to improve PT Goal Formulation: With patient/family Time For Goal Achievement: 01/05/23 Potential to Achieve Goals: Good Progress towards PT goals: Not progressing toward goals - comment (agitation)    Frequency    Min 1X/week      PT Plan      Gomez-evaluation              AM-PAC PT "6 Clicks" Mobility   Outcome Measure  Help needed turning from your back to your side while in a flat bed without using bedrails?: A Lot Help needed moving from lying on your back to sitting on the side of a flat bed without using bedrails?: Total Help needed moving to and from a bed to a chair (including a wheelchair)?: Total Help needed standing up from a chair using your arms (e.g., wheelchair or bedside chair)?: Total Help needed to walk in hospital room?: Total Help needed climbing 3-5 steps with a railing? : Total 6 Click Score: 7    End of Session   Activity Tolerance: Treatment limited secondary to agitation Patient left: in bed;with bed alarm set;with nursing/sitter in room;with call bell/phone  within reach (mitts) Nurse Communication: Mobility status PT Visit Diagnosis: Other abnormalities of gait and mobility (R26.89);Muscle weakness (generalized) (M62.81);Other symptoms and signs involving the nervous system (R29.898);Pain Pain - part of body:  (abdomen)     Time: 1610-9604 PT Time Calculation (min) (ACUTE ONLY): 24 min  Charges:    $Therapeutic Activity: 23-37 mins PT General Charges $$ ACUTE PT VISIT: 1 Visit                     [name], PT  Acute Rehab Services Secure chat preferred Office [phone]    [name] 01/02/2023, 12:52 PM

## 2023-01-02 NOTE — Progress Notes (Signed)
Speech Language Pathology Treatment: Dysphagia;Cognitive-Linquistic  Patient Details Name: James Szoke Sr. MRN: [id number] DOB: 1931-08-19 Today's Date: 01/02/2023 Time: 1131-1200 SLP Time Calculation (min) (ACUTE ONLY): 29 min  Assessment / Plan / Recommendation Clinical Impression  Pt much more alert this morning, verbalizing throughout the session. Provided oral care, although pt with noted aversion, at times reporting pain. Pt agitated today during session, swatting hands away and not accepting ice chips into his oral cavity. Gradually, he accepted thin liquids via straw siphon with noted immediate coughing after multiple swallows. He was then able to initiate sips of thin liquids via straw, which resulted in immediate coughing and wet vocal quality. SLP provided one teaspoonful of purees with coughing observed and oral holding. Pt did not follow commands to open his mouth and reswallow, although suspect increased oral residue with pureed textures. Overall, pt is more alert today, although RN reports this continues to fluctuate. Feel that pt would benefit from a repeat MBS as cognition continues to improve. SLP provided education to pt's sister, wife, and granddaughter were present throughout the session. Recommend he remain NPO with intermittent ice chips and sips of water pending results of MBS only when fully awake and alert. Will continue to follow to assess readiness.    HPI HPI: Pt is an 89 y.o. male who presented with AMS after a fall. CT head: Scattered areas of subarachnoid hemorrhage in the left occipital  lobe, bilateral high frontal lobes, and right temporal lobe. Small amount of subarachnoid hemorrhage posterior to the right midbrain extending into the cerebral aqueduct. PMH: COPD, prior NSTEMI / CHF, NSVT, PAF, on ASA81.      SLP Plan  Continue with current plan of care      Recommendations for follow up therapy are one component of a multi-disciplinary discharge planning  process, led by the attending physician.  Recommendations may be updated based on patient status, additional functional criteria and insurance authorization.    Recommendations  Diet recommendations: NPO Medication Administration: Via alternative means                  Oral care QID;Staff/trained caregiver to provide oral care;Oral care prior to ice chip/H20   Frequent or constant Supervision/Assistance Dysphagia, oropharyngeal phase (R13.12);Cognitive communication deficit (R51.884)     Continue with current plan of care     Gwynneth Aliment, M.A., CF-SLP Speech Language Pathology, Acute Rehabilitation Services  Secure Chat preferred [phone]   01/02/2023, 12:55 PM

## 2023-01-02 NOTE — Progress Notes (Signed)
   Palliative Medicine Inpatient Follow Up Note HPI: James Gangl Sr. is a 89 y.o. male with past medical history of Nonsustained V. tach, A-fib, ischemic cardiomyopathy (HFrEF with improved EF 35% improved to 55% 2022) HTN, COPD, hypothyroidism, chronic anemia, frequent falls. S/P traumatic subarachnoid hemorrhage. Palliative care has been asked to get involved to support additional goals of care conversations.   Today's Discussion 01/02/2023  *Please note that this is a verbal dictation therefore any spelling or grammatical errors are due to the "[employer]agon [employer] One" system interpretation.  Chart reviewed inclusive of vital signs, progress notes, laboratory results, and diagnostic images.   I met with [name], his DIL, and sister at bedside this morning. [name] is in good spirits and greets me though is able to say little more there after.   Dr. Benjamine Mola present also, she shares the plan to repeat head CT and get another swallow study. Further conversations to ensue pending those results.   Questions and concerns addressed.  Objective Assessment: Vital Signs Vitals:   01/02/23 0801 01/02/23 1045  BP: 133/76 119/69  Pulse: 83 70  Resp: (!) 22 (!) 23  Temp: 98.4 F (36.9 C) 98 F (36.7 C)  SpO2: 95% 97%    Intake/Output Summary (Last 24 hours) at 01/02/2023 1320 Last data filed at 01/02/2023 0815 Gross per 24 hour  Intake 30 ml  Output 1650 ml  Net -1620 ml   Last Weight  Most recent update: 01/02/2023  4:38 AM    Weight  55.8 kg (123 lb 0.3 oz)            Gen:  Frail elderly Caucasian M in NAD HEENT: moist mucous membranes CV: Irregular rate and rhythm  PULM: On RA, breathing is even and non-labored ABD: soft/nontender  EXT: No edema  Neuro: Somenolent  SUMMARY OF RECOMMENDATIONS   Full code --> Has a "Hard Choices" book to review. Ideally patient to clear enough to guide medical team for the future   Goals at this time are to allow time for outcomes --> Plan for  primary team to continue conversations given that family has felt overwhelmed by Palliative visits per nursing staff   Ongoing intermittent PMT support    Billing based on MDM: Moderate ______________________________________________________________________________________ Lamarr Lulas [place] Palliative Medicine Team Team Cell Phone: [phone] Please utilize secure chat with additional questions, if there is no response within 30 minutes please call the above phone number  Palliative Medicine Team providers are available by phone from 7am to 7pm daily and can be reached through the team cell phone.  Should this patient require assistance outside of these hours, please call the patient's attending physician.

## 2023-01-02 NOTE — Plan of Care (Signed)

## 2023-01-03 DIAGNOSIS — I609 Nontraumatic subarachnoid hemorrhage, unspecified: Secondary | ICD-10-CM | POA: Diagnosis not present

## 2023-01-03 LAB — GLUCOSE, CAPILLARY
Glucose-Capillary: 104 mg/dL — ABNORMAL HIGH (ref 70–99)
Glucose-Capillary: 114 mg/dL — ABNORMAL HIGH (ref 70–99)
Glucose-Capillary: 119 mg/dL — ABNORMAL HIGH (ref 70–99)
Glucose-Capillary: 137 mg/dL — ABNORMAL HIGH (ref 70–99)
Glucose-Capillary: 140 mg/dL — ABNORMAL HIGH (ref 70–99)
Glucose-Capillary: 141 mg/dL — ABNORMAL HIGH (ref 70–99)

## 2023-01-03 MED ORDER — METOPROLOL TARTRATE 25 MG PO TABS
25.0000 mg | ORAL_TABLET | Freq: Two times a day (BID) | ORAL | Status: DC
  Administered 2023-01-03 – 2023-01-19 (×27): 25 mg
  Filled 2023-01-03 (×28): qty 1

## 2023-01-03 MED ORDER — PROSOURCE TF20 ENFIT COMPATIBL EN LIQD
60.0000 mL | Freq: Every day | ENTERAL | Status: DC
  Administered 2023-01-03 – 2023-01-18 (×13): 60 mL
  Filled 2023-01-03 (×14): qty 60

## 2023-01-03 NOTE — Progress Notes (Signed)
   Palliative Medicine Inpatient Follow Up Note   Per conversations with Dr. Benjamine Mola and nursing staff, patients family have expressed feeling overwhelmed by Palliative Care's ongoing involvement with Fayrene Fearing' care.   Through secure chat with Dr. Benjamine Mola it was determined that the Palliative team sign off at this time.   We will remain available as needed if goals change or additional issues arise.   No Charge ______________________________________________________________________________________ Lamarr Lulas [place] Palliative Medicine Team Team Cell Phone: [phone] Please utilize secure chat with additional questions, if there is no response within 30 minutes please call the above phone number  Palliative Medicine Team providers are available by phone from 7am to 7pm daily and can be reached through the team cell phone.  Should this patient require assistance outside of these hours, please call the patient's attending physician.

## 2023-01-03 NOTE — Progress Notes (Signed)
Speech Language Pathology Treatment: Dysphagia;Cognitive-Linquistic  Patient Details Name: James Sentz Sr. MRN: [id number] DOB: [birth year] Today's Date: 01/03/2023 Time: 7846-9629 SLP Time Calculation (min) (ACUTE ONLY): 25 min  Assessment / Plan / Recommendation Clinical Impression  Pt much more alert today, responding socially and answering questions from SLP and his daughter. Pt effectively following commands with increased consistency when given increased wait time. Pt eagerly accepting PO presentations today, stating "yes, please" and initiating sips given Min cueing via straw and cup. Pt observed with increased trials of thin liquids and purees with decreased s/s of dysphagia and aspiration. Pt with only one delayed throat clear throughout all trials and reduced oral holding. Pt with better ability to respond to cueing to swallow and follow solids with a liquid wash. If pt is able to sustain this level of attention and alertness more consistently, feel that he is appropriate for a repeat MBS to further assess swallowing. Suspect pt's dysphagia may be in part, cognitively based. Discussed plan with RN and pt's daughter, who are in agreement. Provided education to pt's daughter regarding dysphagia and cognition. Plan to complete an MBS as scheduling allows to make further recommendations. Will continue to follow.    HPI HPI: Pt is an 89 y.o. male who presented with AMS after a fall. CT head: Scattered areas of subarachnoid hemorrhage in the left occipital  lobe, bilateral high frontal lobes, and right temporal lobe. Small amount of subarachnoid hemorrhage posterior to the right midbrain extending into the cerebral aqueduct. CTH 8/26 with decreased size and density of previously seen multifocal SAH. PMH: COPD, prior NSTEMI / CHF, NSVT, PAF, on ASA81.      SLP Plan  MBS      Recommendations for follow up therapy are one component of a multi-disciplinary discharge planning process, led by the  attending physician.  Recommendations may be updated based on patient status, additional functional criteria and insurance authorization.    Recommendations  Diet recommendations: NPO Medication Administration: Via alternative means                  Oral care QID;Staff/trained caregiver to provide oral care;Oral care prior to ice chip/H20   Frequent or constant Supervision/Assistance Dysphagia, oropharyngeal phase (R13.12);Cognitive communication deficit (B28.413)     MBS     Gwynneth Aliment, M.A., CF-SLP Speech Language Pathology, Acute Rehabilitation Services  Secure Chat preferred [phone]   01/03/2023, 1:59 PM

## 2023-01-03 NOTE — Plan of Care (Signed)

## 2023-01-03 NOTE — Progress Notes (Signed)
Nutrition Follow-up  DOCUMENTATION CODES:   Severe malnutrition in context of chronic illness  INTERVENTION:   Continue tube feeds via Cortrak: - Osmolite 1.2 @ 65 ml/hr (1560 ml/day) - Add PROSource TF20 60 ml daily - Free water flushes per MD, currently 100 ml every 12 hours  Tube feeding regimen provides 1952 kcal, 106 grams of protein, and 1279 ml of H2O.  Total free water with flushes: 1479 ml  NUTRITION DIAGNOSIS:   Severe Malnutrition related to chronic illness (COPD, HFrEF) as evidenced by severe fat depletion, severe muscle depletion.  Ongoing, being addressed via TF  GOAL:   Patient will meet greater than or equal to 90% of their needs  Met via TF  MONITOR:   Diet advancement, Labs, Weight trends, TF tolerance  REASON FOR ASSESSMENT:   Consult Assessment of nutrition requirement/status, Enteral/tube feeding initiation and management  ASSESSMENT:   89 year old male who presented to the ED on 8/08 after a fall with head trauma. PMH of COPD, prior NSTEMI, HFrEF, PAF, HTN, HLD. Pt admitted with SAH, acute metabolic encephalopathy.  08/09 - diet advanced to full liquids 08/11 - SLP evaluation, recommendation for full liquid diet 08/12 - SLP recommending NPO due to lethargy, Cortrak placed (tip gastric) 8/21 - MBS with recommendations for NPO  Noted plan for MBS as scheduling allows.  Spoke with pt and daughter at bedside. Tube feeds infusing as ordered. Pt tolerating well. Discussed pt with RN.  Pt's daughter still deciding on PEG tube placement. She is awaiting results of MBS to help inform her decision.  Admit weight: 63.5 kg (appears to be stated/estimated) Current weight: 53.5 kg  Current TF: Osmolite 1.2 @ 65 ml/hr, free water flushes 100 ml every 12 hours  Medications reviewed and include: colace, pepcid  Labs reviewed: sodium 133, WBC 14.3 CBG's: 1179-137 x 24 hours  UOP: 1550 ml x 24 hours  Diet Order:   Diet Order             Diet NPO  time specified Except for: Ice Chips  Diet effective now                   EDUCATION NEEDS:   Education needs have been addressed (spoke with pt's daughter)  Skin:  Skin Assessment: Reviewed RN Assessment (laceration R eye)  Last BM:  01/03/23 large type 6  Height:   Ht Readings from Last 1 Encounters:  12/16/22 6' (1.829 m)    Weight:   Wt Readings from Last 1 Encounters:  01/03/23 53.5 kg    Ideal Body Weight:  80.9 kg  BMI:  Body mass index is 16 kg/m.  Estimated Nutritional Needs:   Kcal:  1750-1950  Protein:  80-95 grams  Fluid:  1.7-1.9 L    Mertie Clause, MS, RD, LDN Registered Dietitian II Please see AMiON for contact information.

## 2023-01-03 NOTE — Progress Notes (Signed)
[facility] PROGRESS NOTE  James Rybinski Sr. (DOB: 1931/06/26) ZOX:[id number]   PCP: Blane Ohara, MD    Brief Narrative: Briana Wingert Sr. is an 89 y.o. male with a history of A-fib, NSVT, ischemic cardiomyopathy (NSTEMI s/p LAD stent 2022), HFrEF (LVEF improved to 55% 2022 from 35%), HTN, COPD, hypothyroidism, chronic anemia, frequent falls who presented 8/8 after a fall found to have a traumatic subarachnoid hemorrhage.   Hospitalization has been complicated by E. coli UTI, asymptomatic runs of AFib and PVCs for which cardiology was consulted, acute urinary retention for which urology was consulted, placed urinary catheter 8/12, dysphagia for which cortrak tube has been placed, and persistent encephalopathy. Palliative care is following.  SLP following.  Patient making little to no progress-- awake and alert at times but not consistently.  Daughter still deciding on PEG tube-- have asked her to have a decision by Wednesday as cortract has been in for >2 weeks  Subjective: Awake and interacting with RN-- not consistently following commands  Objective: BP 114/62   Pulse 76   Temp 97.7 F (36.5 C) (Axillary)   Resp 17   Ht 6' (1.829 m)   Wt 53.5 kg   SpO2 96%   BMI 16.00 kg/m       General: Appearance:    Thin male in no acute distress     Lungs:      respirations unlabored  Heart:    Normal heart rate.    MS:   All extremities are intact.   Neurologic:   Awake, alert      Assessment & Plan: Traumatic subarachnoid hemorrhage: - Continuing observation per neurosurgery. Plan to repeat CT head in a couple weeks per their notes. Last CT 8/13 mentions vasogenic edema, and expected redistribution of contusions. ?of mass though wasn't present previously. Pt cannot tolerate MRI (even CT was motion degraded), so we're deferring further imaging at this time. -repeat CT scan 8/26 shows improvement in size of SDH  Fever, persistent leukocytosis: Isolated fever in neuro  pt 8/19. No s/sxs or VTE and has been getting prophylaxis w/heparin. -check x ray normal. UA without pyuria. No cellulitis by exam. No meningismus.  - Continue to monitor blood cultures off antibiotics for now. Low threshold for empiric coverage -trend CBC    TBI with acute encephalopathy:  - Optimize medical and nutritional status - He did not have significant delirium during hospitalization in 2022, so this is likely related to TBI/hemorrhage.  - Palliative following, we are giving time for outcomes  Severe protein calorie malnutrition: Body mass index is 16 kg/m.  - Feeding thru cortrak. Keep NPO per SLP evaluation. Will continue seeing how his clinical status progresses.    E. coli UTI: Resistant to ancef. Leukocytosis stable.  - Received ceftriaxone x7 days     Acute urinary retention, BPH, prostate CA s/p brachytherapy: - Urology consulted, placed catheter 8/12 over a wire. Will continue catheter until more mobile   PAF w/RVR, frequent PVCs, hx NSVT: Currently in sinus rhythm with PACs, PVCs. - Rate elevated a bit, increased metoprolol to 50mg  BID - Cardiology consulted and signed off - Continue metoprolol 5mg  IV prn  - Holding full dose anticoagulation due to fall risk and current SAH, SDH. Not a good candidate for Watchman device.   CAD s/p LAD stent 2022: elevated troponin is mild at 32, flat, without angina consistent with demand myocardial ischemia.  - Continue beta blocker, statin. Not on antiplatelet due to bleeding.  Chronic HFrEF with improved EF (35>55%, 2022): - Continue metoprolol.   - Remains euvolemic, continue osmolite 1.2 for full nutritional support, continue free water q6h.   Hypokalemia: Resolved.  - Continue monitoring  Hyponatremia:  - Decreased free water -- Continue monitoring   Hypophosphatemia: Resolved with supplementation.   Chronic normocytic anemia: No bleeding, urine without gross hematuria. Deferred GI work up PTA due to frailty.  -  Monitor intermittently, remains stable.   Moderate persistent asthma, uncomplicated: No exacerbation. - Continue DuoNebs.   Acquired hypothyroidism: TSH is 6.177, free T4 is normal.  - Continue levothyroxine 175 mcg daily   Essential hypertension - Continue metoprolol   Debility, deconditioning, frailty: At current level, would require SNF level of care. Continue mobilizing as able.    Goals of care.  Monitoring for response Delirium precautions  Constipation -bowel regimen PRN   [name], DO [facility] [email or web address] 01/03/2023, 12:10 PM

## 2023-01-04 ENCOUNTER — Inpatient Hospital Stay (HOSPITAL_COMMUNITY): Payer: Medicare HMO

## 2023-01-04 DIAGNOSIS — G9341 Metabolic encephalopathy: Secondary | ICD-10-CM | POA: Diagnosis not present

## 2023-01-04 DIAGNOSIS — I609 Nontraumatic subarachnoid hemorrhage, unspecified: Secondary | ICD-10-CM | POA: Diagnosis not present

## 2023-01-04 DIAGNOSIS — E876 Hypokalemia: Secondary | ICD-10-CM | POA: Diagnosis not present

## 2023-01-04 DIAGNOSIS — I480 Paroxysmal atrial fibrillation: Secondary | ICD-10-CM | POA: Diagnosis not present

## 2023-01-04 LAB — CBC
HCT: 34.9 % — ABNORMAL LOW (ref 39.0–52.0)
Hemoglobin: 11 g/dL — ABNORMAL LOW (ref 13.0–17.0)
MCH: 30.4 pg (ref 26.0–34.0)
MCHC: 31.5 g/dL (ref 30.0–36.0)
MCV: 96.4 fL (ref 80.0–100.0)
Platelets: 491 10*3/uL — ABNORMAL HIGH (ref 150–400)
RBC: 3.62 MIL/uL — ABNORMAL LOW (ref 4.22–5.81)
RDW: 12.5 % (ref 11.5–15.5)
WBC: 16.2 10*3/uL — ABNORMAL HIGH (ref 4.0–10.5)
nRBC: 0 % (ref 0.0–0.2)

## 2023-01-04 LAB — BASIC METABOLIC PANEL
Anion gap: 9 (ref 5–15)
BUN: 31 mg/dL — ABNORMAL HIGH (ref 8–23)
CO2: 21 mmol/L — ABNORMAL LOW (ref 22–32)
Calcium: 8.4 mg/dL — ABNORMAL LOW (ref 8.9–10.3)
Chloride: 101 mmol/L (ref 98–111)
Creatinine, Ser: 0.69 mg/dL (ref 0.61–1.24)
GFR, Estimated: >60 mL/min (ref >60)
Glucose, Bld: 142 mg/dL — ABNORMAL HIGH (ref 70–99)
Potassium: 4.2 mmol/L (ref 3.5–5.1)
Sodium: 131 mmol/L — ABNORMAL LOW (ref 135–145)

## 2023-01-04 LAB — GLUCOSE, CAPILLARY
Glucose-Capillary: 116 mg/dL — ABNORMAL HIGH (ref 70–99)
Glucose-Capillary: 119 mg/dL — ABNORMAL HIGH (ref 70–99)
Glucose-Capillary: 131 mg/dL — ABNORMAL HIGH (ref 70–99)
Glucose-Capillary: 129 mg/dL — ABNORMAL HIGH (ref 70–99)
Glucose-Capillary: 122 mg/dL — ABNORMAL HIGH (ref 70–99)
Glucose-Capillary: 136 mg/dL — ABNORMAL HIGH (ref 70–99)

## 2023-01-04 NOTE — Progress Notes (Signed)
Speech Language Pathology Treatment: Dysphagia  Patient Details Name: James Trimmer Sr. MRN: [id number] DOB: May 24, 1931 Today's Date: 01/04/2023 Time: 5638-7564 SLP Time Calculation (min) (ACUTE ONLY): 17 min  Assessment / Plan / Recommendation Clinical Impression  Per RN and pt family request, SLP conducted session to provide education regarding results of MBS. Shared images from [facility] and thoroughly described pt's swallow function today as well as risk factors for aspiration. Reinforced thought that some deficits may be attributed to acute cognitive changes, although suspect more chronic dysphagia also contributing to current presentation. Provided education regarding aspiration precautions with NPO status and potential for liberalizing diet pending further GOC decisions. Will continue to follow to reinforce aspiration precautions and target cognitive linguistic goals.    HPI HPI: Pt is an 89 y.o. male who presented with AMS after a fall. CT head: Scattered areas of subarachnoid hemorrhage in the left occipital  lobe, bilateral high frontal lobes, and right temporal lobe. Small amount of subarachnoid hemorrhage posterior to the right midbrain extending into the cerebral aqueduct. CTH 8/26 with decreased size and density of previously seen multifocal SAH. PMH: COPD, prior NSTEMI / CHF, NSVT, PAF, on ASA81.      SLP Plan  Goals updated      Recommendations for follow up therapy are one component of a multi-disciplinary discharge planning process, led by the attending physician.  Recommendations may be updated based on patient status, additional functional criteria and insurance authorization.    Recommendations  Diet recommendations: NPO Medication Administration: Via alternative means                  Oral care QID;Staff/trained caregiver to provide oral care;Oral care prior to ice chip/H20   Frequent or constant Supervision/Assistance Dysphagia, oropharyngeal phase  (R13.12);Cognitive communication deficit (P32.951)     Goals updated     Gwynneth Aliment, M.A., CF-SLP Speech Language Pathology, Acute Rehabilitation Services  Secure Chat preferred [phone]  01/04/2023, 12:06 PM

## 2023-01-04 NOTE — Progress Notes (Signed)
Occupational Therapy Treatment Patient Details Name: James Stake Sr. MRN: [id number] DOB: 24-Mar-1932 Today's Date: 01/04/2023   History of present illness Pt is an 89 y.o. male who presented 12/15/22 s/p fall with head trauma. CTH shows diffuse scattered traumatic pattern subarachnoid hemorrhage. Cortrak placed 8/12. PMHx includes COPD, prior NSTEMI / CHF, NSVT, PAF, anemia, bradycardia, senile osteoporosis, CAD, HLD, HTN   OT comments  Patient with continued poor tolerance of advancement of OT goals towards functional tasks. Patient remains combative and agitated with movement, with need for significant assist to sit EOB (up to max A) despite utilization of preferred RN who patient typically will participate more with. Patient with need for total A of 2 to stand with no active participation noted, attempting to thrust himself posteriorly when attempting upright posture (flexed posture throughout). OT recommendation remains appropriate, with goals updated, however would welcome discussions with palliative care when family feels ready as patient remains total A for all aspects of care.       If plan is discharge home, recommend the following:  Two people to help with walking and/or transfers;A lot of help with bathing/dressing/bathroom;Direct supervision/assist for medications management;Direct supervision/assist for financial management;Assist for transportation;Help with stairs or ramp for entrance;Supervision due to cognitive status   Equipment Recommendations  Other (comment)    Recommendations for Other Services      Precautions / Restrictions Precautions Precautions: Fall;Other (comment) Precaution Comments: cortrak; bil mitts Restrictions Weight Bearing Restrictions: No       Mobility Bed Mobility Overal bed mobility: Needs Assistance Bed Mobility: Supine to Sit, Sit to Supine     Supine to sit: +2 for physical assistance, Max assist, +2 for safety/equipment Sit to supine:  +2 for physical assistance, +2 for safety/equipment, Total assist   General bed mobility comments: Patient with minimal increased participation in order to transition to EOB at max A of 2, patient would occasionally have moments where he could sit at min gaurd, however with significant posterior lean the majority of the time, patient required total A to be assisted back into bed    Transfers Overall transfer level: Needs assistance Equipment used: 2 person hand held assist Transfers: Sit to/from Stand Sit to Stand: +2 physical assistance, Total assist, +2 safety/equipment           General transfer comment: PT/OT placing his BUEs on arms to promote stand, patient with heavy posterior lean and feet sliding anteriorly, zero command following to power up into stand or to fix posture (flexed throughout), returned back to bed due to total A of 2 to maintain transfer and attempt standing     Balance Overall balance assessment: Needs assistance Sitting-balance support: Feet supported, Bilateral upper extremity supported Sitting balance-Leahy Scale: Poor   Postural control: Posterior lean Standing balance support: Bilateral upper extremity supported Standing balance-Leahy Scale: Zero Standing balance comment: Total A +2 and bil knees blocked with bil HHA - pt attempts to push into hip extension with positional change                           ADL either performed or assessed with clinical judgement   ADL Overall ADL's : Needs assistance/impaired Eating/Feeding: NPO   Grooming: Total assistance                   Toilet Transfer: Total assistance;+2 for safety/equipment;+2 for physical assistance;Squat-pivot           Functional mobility during  ADLs: Total assistance;+2 for physical assistance;+2 for safety/equipment General ADL Comments: Patient remains total A for all aspects of care despite increased level of arousal    Extremity/Trunk Assessment               Vision       Perception     Praxis      Cognition Arousal: Alert Behavior During Therapy: Agitated, Restless Overall Cognitive Status: Impaired/Different from baseline Area of Impairment: Orientation, Attention, Memory, Following commands, Safety/judgement, Awareness, Problem solving               Rancho Levels of Cognitive Functioning [place] Scales of Cognitive Functioning: Confused/Agitated: Maximal Assistance Orientation Level: Place, Time, Situation Current Attention Level: Focused Memory: Decreased recall of precautions, Decreased short-term memory Following Commands: Follows one step commands inconsistently Safety/Judgement: Decreased awareness of safety, Decreased awareness of deficits Awareness: Intellectual Problem Solving: Slow processing, Decreased initiation, Difficulty sequencing, Requires verbal cues, Requires tactile cues General Comments: Patient remains combative and agitated, threatening multiple times to "punch you in the nose" to PT when attempting movements, despite the use of preferred RN could not advance past EOB [facility] Scales of Cognitive Functioning: Confused/Agitated: Maximal Assistance [IV]      Exercises      Shoulder Instructions       General Comments      Pertinent Vitals/ Pain       Pain Assessment Pain Assessment: Faces Faces Pain Scale: Hurts even more Pain Location: generalized with movement Pain Descriptors / Indicators: Discomfort, Grimacing Pain Intervention(s): Limited activity within patient's tolerance, Monitored during session, Repositioned  Home Living                                          Prior Functioning/Environment              Frequency  Min 1X/week        Progress Toward Goals  OT Goals(current goals can now be found in the care plan section)  Progress towards OT goals: Not progressing toward goals - comment (incremental to no progress)  Acute Rehab  OT Goals Patient Stated Goal: unable OT Goal Formulation: Patient unable to participate in goal setting Time For Goal Achievement: 01/18/23 Potential to Achieve Goals: Poor ADL Goals Pt Will Perform Grooming: with contact guard assist;sitting Pt Will Perform Upper Body Bathing: with min assist;sitting Pt Will Perform Lower Body Bathing: with min assist;sitting/lateral leans Pt Will Transfer to Toilet: with mod assist;squat pivot transfer Pt Will Perform Toileting - Clothing Manipulation and hygiene: with mod assist;sitting/lateral leans Additional ADL Goal #1: Pt will make eye contact with speaker without cues 75% of the time Additional ADL Goal #2: Pt will maintain unsupported seated balance at min guard level for approx 5 min in preparation for ADL participation  Plan      Co-evaluation    PT/OT/SLP Co-Evaluation/Treatment: Yes Reason for Co-Treatment: Necessary to address cognition/behavior during functional activity;For patient/therapist safety;To address functional/ADL transfers PT goals addressed during session: Mobility/safety with mobility;Balance OT goals addressed during session: ADL's and self-care;Strengthening/ROM      AM-PAC OT "6 Clicks" Daily Activity     Outcome Measure   Help from another person eating meals?: Total (NPO) Help from another person taking care of personal grooming?: Total Help from another person toileting, which includes using toliet, bedpan, or urinal?: Total Help from another person bathing (  including washing, rinsing, drying)?: Total Help from another person to put on and taking off regular upper body clothing?: Total Help from another person to put on and taking off regular lower body clothing?: Total 6 Click Score: 6    End of Session Equipment Utilized During Treatment: Gait belt  OT Visit Diagnosis: Unsteadiness on feet (R26.81);Other symptoms and signs involving the nervous system (R29.898);Other symptoms and signs involving cognitive  function   Activity Tolerance Patient limited by fatigue;Treatment limited secondary to agitation   Patient Left in bed;with call bell/phone within reach;with bed alarm set   Nurse Communication Mobility status        Time: 1610-9604 OT Time Calculation (min): 23 min  Charges: OT General Charges $OT Visit: 1 Visit OT Treatments $Self Care/Home Management : 8-22 mins  Pollyann Glen E. Davisha Linthicum, OTR/L Acute Rehabilitation Services [phone]   Cherlyn Cushing 01/04/2023, 2:30 PM

## 2023-01-04 NOTE — Progress Notes (Signed)
Physical Therapy Treatment Patient Details Name: James Wardrop Sr. MRN: [id number] DOB: Feb 25, 1932 Today's Date: 01/04/2023   History of Present Illness Pt is an 89 y.o. male who presented 12/15/22 s/p fall with head trauma. CTH shows diffuse scattered traumatic pattern subarachnoid hemorrhage. Cortrak placed 8/12. PMHx includes COPD, prior NSTEMI / CHF, NSVT, PAF, anemia, bradycardia, senile osteoporosis, CAD, HLD, HTN    PT Comments  Pt remains agitated and combative, limiting his progress towards his PT goals. Attempted to have his preferred RN instruct him through the session as he reportedly listens and participates more with her. However, pt did not demonstrate much initiation with mobility and demonstrated strong retropulsion at times, especially when trying to stand. He also impulsively reaches for his cortrak line and tries to pull it often, needing continual blocking of his hands throughout the session to successfully prevent him from pulling it. Mittens donned end of session. Educated pt's daughter on TBIs and the Ranchos scale. Will continue to follow acutely.     If plan is discharge home, recommend the following: Two people to help with walking and/or transfers;Two people to help with bathing/dressing/bathroom;Direct supervision/assist for financial management;Direct supervision/assist for medications management;Assistance with feeding;Help with stairs or ramp for entrance;Assist for transportation;Assistance with cooking/housework   Can travel by private vehicle     No  Equipment Recommendations  Other (comment) (TBD)    Recommendations for Other Services       Precautions / Restrictions Precautions Precautions: Fall;Other (comment) Precaution Comments: cortrak; bil mitts; agitated intermittently Restrictions Weight Bearing Restrictions: No     Mobility  Bed Mobility Overal bed mobility: Needs Assistance Bed Mobility: Supine to Sit, Sit to Supine     Supine to sit:  +2 for physical assistance, Max assist, +2 for safety/equipment Sit to supine: +2 for physical assistance, +2 for safety/equipment, Total assist   General bed mobility comments: Patient with minimal increased participation in order to transition to EOB at max A of 2, patient would occasionally have moments where he could sit at CGA, however with significant posterior lean the majority of the time, patient required total A to be assisted back into bed    Transfers Overall transfer level: Needs assistance Equipment used: 2 person hand held assist Transfers: Sit to/from Stand Sit to Stand: +2 physical assistance, Total assist, +2 safety/equipment           General transfer comment: PT/OT placing his Bil UEs on arms to promote stand, patient with heavy posterior lean and feet sliding anteriorly, zero command following to power up into stand or to fix posture (flexed throughout), returned back to bed due to total A of 2 to maintain transfer and attempt standing    Ambulation/Gait               General Gait Details: unable at this time   Stairs             Wheelchair Mobility     Tilt Bed    Modified Rankin (Stroke Patients Only)       Balance Overall balance assessment: Needs assistance Sitting-balance support: Feet supported, Bilateral upper extremity supported Sitting balance-Leahy Scale: Poor Sitting balance - Comments: Posterior lean majority of time, varying in assistance level based on extent of his lean. Moments of CGA when pt would rest his elbows on his thighs. Postural control: Posterior lean Standing balance support: Bilateral upper extremity supported Standing balance-Leahy Scale: Zero Standing balance comment: Total A +2 and bil knees blocked with bil  HHA - pt attempts to push into hip extension with positional change                            Cognition Arousal: Alert Behavior During Therapy: Agitated, Restless Overall Cognitive  Status: Impaired/Different from baseline Area of Impairment: Orientation, Attention, Memory, Following commands, Safety/judgement, Awareness, Problem solving               Rancho Levels of Cognitive Functioning [place] Scales of Cognitive Functioning: Confused/Agitated: Maximal Assistance Orientation Level: Place, Time, Situation Current Attention Level: Focused Memory: Decreased recall of precautions, Decreased short-term memory Following Commands: Follows one step commands inconsistently Safety/Judgement: Decreased awareness of safety, Decreased awareness of deficits Awareness: Intellectual Problem Solving: Slow processing, Decreased initiation, Difficulty sequencing, Requires verbal cues, Requires tactile cues General Comments: Patient remains combative and agitated, threatening multiple times to "punch you in the nose" to PT when attempting movements, despite the use of preferred RN could not advance past EOB   [facility] Scales of Cognitive Functioning: Confused/Agitated: Maximal Assistance [IV]    Exercises      General Comments General comments (skin integrity, edema, etc.): VSS on RA; educated wife verbally on Ranchos levels and TBI, x12 min      Pertinent Vitals/Pain Pain Assessment Pain Assessment: Faces Faces Pain Scale: Hurts even more Pain Location: generalized with movement Pain Descriptors / Indicators: Discomfort, Grimacing Pain Intervention(s): Limited activity within patient's tolerance, Monitored during session, Repositioned    Home Living                          Prior Function            PT Goals (current goals can now be found in the care plan section) Acute Rehab PT Goals Patient Stated Goal: family hopes for pt to improve PT Goal Formulation: With patient/family Time For Goal Achievement: 01/05/23 Potential to Achieve Goals: Fair Progress towards PT goals: Not progressing toward goals - comment (limited by impaired  cognition)    Frequency    Min 1X/week      PT Plan      Co-evaluation PT/OT/SLP Co-Evaluation/Treatment: Yes Reason for Co-Treatment: Necessary to address cognition/behavior during functional activity;For patient/therapist safety;To address functional/ADL transfers PT goals addressed during session: Mobility/safety with mobility;Balance OT goals addressed during session: ADL's and self-care;Strengthening/ROM      AM-PAC PT "6 Clicks" Mobility   Outcome Measure  Help needed turning from your back to your side while in a flat bed without using bedrails?: Total Help needed moving from lying on your back to sitting on the side of a flat bed without using bedrails?: Total Help needed moving to and from a bed to a chair (including a wheelchair)?: Total Help needed standing up from a chair using your arms (e.g., wheelchair or bedside chair)?: Total Help needed to walk in hospital room?: Total Help needed climbing 3-5 steps with a railing? : Total 6 Click Score: 6    End of Session Equipment Utilized During Treatment: Gait belt Activity Tolerance: Treatment limited secondary to agitation Patient left: in bed;with call bell/phone within reach;with bed alarm set;with nursing/sitter in room;with restraints reapplied Nurse Communication: Mobility status PT Visit Diagnosis: Other abnormalities of gait and mobility (R26.89);Muscle weakness (generalized) (M62.81);Other symptoms and signs involving the nervous system (R29.898);Pain;Unsteadiness on feet (R26.81);Difficulty in walking, not elsewhere classified (R26.2)     Time: 2725-3664 PT Time Calculation (min) (  ACUTE ONLY): 32 min  Charges:    $Therapeutic Activity: 8-22 mins PT General Charges $$ ACUTE PT VISIT: 1 Visit                     Virgil Benedict, PT, DPT Acute Rehabilitation Services  Office: [phone]    Bettina Gavia 01/04/2023, 4:45 PM

## 2023-01-04 NOTE — Progress Notes (Signed)
Modified Barium Swallow Study  Patient Details  Name: Burleigh Plass Sr. MRN: [id number] Date of Birth: 1931-11-08  Today's Date: 01/04/2023  Modified Barium Swallow completed.  Full report located under Chart Review in the Imaging Section.  History of Present Illness Pt is an 89 y.o. male who presented with AMS after a fall. CT head: Scattered areas of subarachnoid hemorrhage in the left occipital  lobe, bilateral high frontal lobes, and right temporal lobe. Small amount of subarachnoid hemorrhage posterior to the right midbrain extending into the cerebral aqueduct. CTH 8/26 with decreased size and density of previously seen multifocal SAH. PMH: COPD, prior NSTEMI / CHF, NSVT, PAF, on ASA81.   Clinical Impression Pt presents with a moderate oropharyngeal dysphagia characterized by deficits related to awareness and reduced strength. Feel that this is in part due to cognition, although also significantly affected by anatomical differences. Overall, pt with poor awareness of bolus presentations, leaving mouth wide open when presented with a spoon, straw, or cup and requiring Max cueing to close mouth and initiate. With larger boluses, there is posterior progression to the level of the pyriform sinuses before he initiates the swallow. He is consistently piecemeal swallowing which affects his overall coordination. Pt presents with reduced pharyngeal strength and suspected prominent cervical osteophytes which create pharyngeal narrowing at the level of the valleculae, contributing to no noted epiglottic inversion. This may also be affected by Cortrak in place, although feel this is likely not an acute difference. Incomplete epiglottic inversion allows the laryngeal vestibule to be open during the swallow contributing to penetration of all consistencies during the swallow as well as after. With thicker consistencies, pt has increased pharyngeal residue collecting along the base of tongue, valleculae,  pharyngeal wall, and aryepiglottic folds. He also has minimal PES opening, resulting in retention of the majority of each bolus and further contributes to residue and risk for airway invasion. Due to pt's reduced sensation and strength, this residue progresses into the laryngeal vestibule when pt is at rest. Observed penetration to the level of the vocal folds without sensation and without ability to clear after a cued cough (PAS 5) with thin liquids, nectar thick liquids, and honey thick liquids. Pureed textures were penetrated above the vocal folds and were not able to be expelled (PAS 3) with concern for continual progression through the trachea without sensation. Overall, feel that pt's cognition does not allow for consistent use of compensatory strategies at this time. Recommend he remain strictly NPO. Will continue to follow.  Factors that may increase risk of adverse event in presence of aspiration [employer] 2021): Poor general health and/or compromised immunity;Reduced cognitive function;Frail or deconditioned;Dependence for feeding and/or oral hygiene;Inadequate oral hygiene  Swallow Evaluation Recommendations Recommendations: NPO Medication Administration: Via alternative means    Gwynneth Aliment, M.A., CF-SLP Speech Language Pathology, Acute Rehabilitation Services  Secure Chat preferred [phone]  01/04/2023,10:59 AM

## 2023-01-04 NOTE — Progress Notes (Signed)
Pt left to CT of abd

## 2023-01-04 NOTE — Progress Notes (Signed)
[facility] PROGRESS NOTE  James Appleyard Sr. (DOB: [birth year]) UJW:[id number]   PCP: Blane Ohara, MD    Brief Narrative: James Elsberry Sr. is an 89 y.o. male with a history of A-fib, NSVT, ischemic cardiomyopathy (NSTEMI s/p LAD stent 2022), HFrEF (LVEF improved to 55% 2022 from 35%), HTN, COPD, hypothyroidism, chronic anemia, frequent falls who presented 8/8 after a fall found to have a traumatic subarachnoid hemorrhage.   Hospitalization has been complicated by E. coli UTI, asymptomatic runs of AFib and PVCs for which cardiology was consulted, acute urinary retention for which urology was consulted, placed urinary catheter 8/12, dysphagia for which cortrak tube has been placed, and persistent encephalopathy. Palliative care is following.  SLP following.  Patient making little to no progress-- awake and alert at times but not consistently.  Daughter still deciding on PEG tube-- have asked her to have a decision by Wednesday as cortrak has been in for >2 weeks  Subjective: Mumbling but his eyes are closed.  Does not cooperate with examination.  Does not answer any questions.    Objective: BP 126/69 (BP Location: Right Arm)   Pulse 84   Temp 98.3 F (36.8 C) (Axillary)   Resp 19   Ht 6' (1.829 m)   Wt 53.5 kg   SpO2 96%   BMI 16.00 kg/m     General appearance: Mumbling.  Does not respond to questions. Resp: Coarse breath sounds bilaterally.  No wheezing or rhonchi. Cardio: S1-S2 is normal regular.  No S3-S4.  No rubs murmurs or bruit GI: Abdomen is soft.  Nontender nondistended.  Bowel sounds are present normal.  No masses organomegaly    Assessment & Plan: Traumatic subarachnoid hemorrhage/subdural hematoma Patient was seen by neurosurgery.   CT 8/13 mentions vasogenic edema, and expected redistribution of contusions. ?of mass though wasn't present previously.  Repeat CT scan 8/26 shows improvement in size of SDH. Neurologically he seems to be stable though is  still encephalopathic.  Fever, persistent leukocytosis Isolated fever on 8/19. No s/sxs or VTE and has been getting prophylaxis w/heparin. -check x ray normal. UA without pyuria. No cellulitis by exam. No meningismus.  Seems to have resolved.  Continue to monitor. Recheck labs tomorrow.  TBI with acute encephalopathy:  - Optimize medical and nutritional status - He did not have significant delirium during hospitalization in 2022, so this is likely related to TBI/hemorrhage.  - Palliative following  Severe protein calorie malnutrition Body mass index is 16 kg/m.  - Feeding thru cortrak. Keep NPO per SLP evaluation. Will continue seeing how his clinical status progresses.  Family thinking about PEG tube.   E. coli UTI Resistant to ancef. Received ceftriaxone x7 days     Acute urinary retention, BPH, prostate CA s/p brachytherapy: Urology was consulted, placed catheter 8/12 over a wire. Will continue catheter until more mobile.   PAF w/RVR, frequent PVCs, hx NSVT Currently in sinus rhythm with PACs, PVCs. Continue metoprolol. - Holding full dose anticoagulation due to fall risk and current SAH, SDH. Not a good candidate for Watchman device.   CAD s/p LAD stent 2022 Elevated troponin is mild at 32, flat, without angina consistent with demand myocardial ischemia.  - Continue beta blocker, statin. Not on antiplatelet due to bleeding.   Chronic HFrEF with improved EF (35>55%, 2022): - Continue metoprolol.   - Remains euvolemic, continue osmolite 1.2 for full nutritional support, continue free water q6h.   Hypokalemia Recheck labs in the morning.  Hyponatremia:  -  Decreased free water -- Continue monitoring   Hypophosphatemia:  Resolved with supplementation.   Chronic normocytic anemia:  No bleeding, urine without gross hematuria. Deferred GI work up PTA due to frailty.  - Monitor intermittently, remains stable.   Moderate persistent asthma, uncomplicated No  exacerbation. - Continue DuoNebs.   Acquired hypothyroidism TSH is 6.177, free T4 is normal.  - Continue levothyroxine 175 mcg daily   Essential hypertension - Continue metoprolol   Debility, deconditioning, frailty At current level, would require SNF level of care. Continue mobilizing as able.    Goals of care Monitoring for response  Constipation -bowel regimen PRN  DVT prophylaxis: Subcutaneous heparin CODE STATUS: Full code Family communication: No family at bedside Disposition: SNF when medically improved    Osvaldo Shipper,  [facility] [email or web address] 01/04/2023, 10:09 AM

## 2023-01-05 ENCOUNTER — Encounter (HOSPITAL_COMMUNITY): Payer: Self-pay | Admitting: Neurological Surgery

## 2023-01-05 DIAGNOSIS — I480 Paroxysmal atrial fibrillation: Secondary | ICD-10-CM | POA: Diagnosis not present

## 2023-01-05 DIAGNOSIS — G9341 Metabolic encephalopathy: Secondary | ICD-10-CM | POA: Diagnosis not present

## 2023-01-05 DIAGNOSIS — I609 Nontraumatic subarachnoid hemorrhage, unspecified: Secondary | ICD-10-CM | POA: Diagnosis not present

## 2023-01-05 DIAGNOSIS — D72829 Elevated white blood cell count, unspecified: Secondary | ICD-10-CM

## 2023-01-05 LAB — BASIC METABOLIC PANEL
Anion gap: 9 (ref 5–15)
BUN: 28 mg/dL — ABNORMAL HIGH (ref 8–23)
CO2: 23 mmol/L (ref 22–32)
Calcium: 8.7 mg/dL — ABNORMAL LOW (ref 8.9–10.3)
Chloride: 100 mmol/L (ref 98–111)
Creatinine, Ser: 0.73 mg/dL (ref 0.61–1.24)
GFR, Estimated: >60 mL/min (ref >60)
Glucose, Bld: 124 mg/dL — ABNORMAL HIGH (ref 70–99)
Potassium: 4.3 mmol/L (ref 3.5–5.1)
Sodium: 132 mmol/L — ABNORMAL LOW (ref 135–145)

## 2023-01-05 LAB — CBC
HCT: 36.7 % — ABNORMAL LOW (ref 39.0–52.0)
Hemoglobin: 11.5 g/dL — ABNORMAL LOW (ref 13.0–17.0)
MCH: 30.2 pg (ref 26.0–34.0)
MCHC: 31.3 g/dL (ref 30.0–36.0)
MCV: 96.3 fL (ref 80.0–100.0)
Platelets: 508 10*3/uL — ABNORMAL HIGH (ref 150–400)
RBC: 3.81 MIL/uL — ABNORMAL LOW (ref 4.22–5.81)
RDW: 12.8 % (ref 11.5–15.5)
WBC: 15.8 10*3/uL — ABNORMAL HIGH (ref 4.0–10.5)
nRBC: 0 % (ref 0.0–0.2)

## 2023-01-05 LAB — GLUCOSE, CAPILLARY
Glucose-Capillary: 155 mg/dL — ABNORMAL HIGH (ref 70–99)
Glucose-Capillary: 105 mg/dL — ABNORMAL HIGH (ref 70–99)
Glucose-Capillary: 111 mg/dL — ABNORMAL HIGH (ref 70–99)
Glucose-Capillary: 116 mg/dL — ABNORMAL HIGH (ref 70–99)
Glucose-Capillary: 93 mg/dL (ref 70–99)

## 2023-01-05 LAB — MAGNESIUM: Magnesium: 2.2 mg/dL (ref 1.7–2.4)

## 2023-01-05 LAB — PROTIME-INR
INR: 1 (ref 0.8–1.2)
Prothrombin Time: 13 seconds (ref 11.4–15.2)

## 2023-01-05 MED ORDER — SODIUM BICARBONATE 650 MG PO TABS
650.0000 mg | ORAL_TABLET | Freq: Once | ORAL | Status: AC
  Administered 2023-01-05: 650 mg
  Filled 2023-01-05: qty 1

## 2023-01-05 MED ORDER — SODIUM CHLORIDE 0.45 % IV SOLN: INTRAVENOUS | Status: AC

## 2023-01-05 MED ORDER — PANCRELIPASE (LIP-PROT-AMYL) 10440-39150 UNITS PO TABS
20880.0000 [IU] | ORAL_TABLET | Freq: Once | ORAL | Status: AC
  Administered 2023-01-05: 20880 [IU]
  Filled 2023-01-05: qty 2

## 2023-01-05 NOTE — Consult Note (Signed)
Chief Complaint: Dysphagia  Referring Provider(s): Osvaldo Shipper, MD   Supervising Physician: Roanna Banning  Patient Status: [facility] - Out-pt  History of Present Illness: James Stuchell Sr. is a 89 y.o. male with a history of A-fib, NSVT, ischemic cardiomyopathy (NSTEMI s/p LAD stent 2022), HFrEF (LVEF improved to 55% 2022 from 35%), HTN, COPD, hypothyroidism, chronic anemia, and frequent falls.  He presented on 12/15/22 after a fall. He was found to have a traumatic subarachnoid hemorrhage.    Hospitalization has been complicated by E. coli UTI, asymptomatic runs of AFib and PVCs for which cardiology was consulted, acute urinary retention for which urology was consulted, placed urinary catheter 8/12, dysphagia for which cortrak tube has been placed, and persistent encephalopathy.   We are asked to evaluate for gastrostomy tube placement.  Patient is Full Code  Past Medical History:  Diagnosis Date   Acquired hypothyroidism 08/15/2019   Acute systolic heart failure (HCC) 06/25/2020   Anemia 02/05/2020   boarderline anemic   Atopic dermatitis 12/05/2019   Bacterial pneumonia, unspecified 06/14/2012   06/14/2012 CXR w/ RML PNA >Zithromax and Rocephin     Bradycardia 11/28/2018   CAD (coronary artery disease) 07/07/2020   Cancer (HCC)    prostate cancer-seed implant   Closed fracture dislocation of right elbow    Closed fracture of right olecranon process 04/10/2020   COPD (chronic obstructive pulmonary disease) (HCC) 01/21/2020   Dermatitis 02/05/2020   Dizziness 02/19/2016   Essential hypertension 11/28/2018   History of prostate cancer    seed implant   Hyperlipemia    Hyperlipidemia, mixed 08/21/2008   Qualifier: Diagnosis of  By: Maple Hudson MD, Clinton D    LVH (left ventricular hypertrophy) 11/28/2018   Malnutrition of moderate degree (HCC) 08/15/2019   Mixed conductive and sensorineural hearing loss, 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   Moderate persistent asthma, uncomplicated 01/21/2020    Nonsustained ventricular tachycardia (HCC) 01/30/2019   NSTEMI (non-ST elevated myocardial infarction) (HCC) 06/21/2020   Other fatigue 02/05/2020   PAF (paroxysmal atrial fibrillation) (HCC) 06/25/2020   Seasonal and perennial allergic rhinitis 05/17/2007       Senile osteoporosis 08/15/2019   SINUSITIS, ACUTE 06/28/2007   Qualifier: Diagnosis of  By: Clent Ridges NP, Tammy     Ventricular ectopy 11/28/2018    Past Surgical History:  Procedure Laterality Date   APPENDECTOMY     CORONARY ATHERECTOMY N/A 06/24/2020   Procedure: CORONARY ATHERECTOMY;  Surgeon: Iran Ouch, MD;  [facility]: [facility];  Service: Cardiovascular;  Laterality: N/A;   CORONARY ULTRASOUND/IVUS N/A 06/24/2020   Procedure: Intravascular Ultrasound/IVUS;  Surgeon: Iran Ouch, MD;  Location: [facility];  Service: Cardiovascular;  Laterality: N/A;   LEFT HEART CATH AND CORONARY ANGIOGRAPHY N/A 06/22/2020   Procedure: LEFT HEART CATH AND CORONARY ANGIOGRAPHY;  Surgeon: Yvonne Kendall, MD;  Location: [facility];  Service: Cardiovascular;  Laterality: N/A;   ORIF ELBOW FRACTURE Right 04/10/2020   Procedure: OPEN REDUCTION INTERNAL FIXATION (ORIF) ELBOW/OLECRANON FRACTURE;  Surgeon: Teryl Lucy, MD;  Location: [facility];  Service: Orthopedics;  Laterality: Right;    Allergies: Clarithromycin  Medications: Prior to Admission medications   Medication Sig Start Date End Date Taking? Authorizing Provider  albuterol (VENTOLIN HFA) 108 (90 Base) MCG/ACT inhaler Inhale 2 puffs into the lungs every 6 (six) hours as needed for wheezing or shortness of breath. 10/12/22  Yes Cox, Kirsten, MD  aspirin 81 MG EC tablet Take 1 tablet (81 mg total)  by mouth daily. Swallow whole. 06/16/21  Yes Georgeanna Lea, MD  ferrous sulfate (FERROUSUL) 325 (65 FE) MG tablet Take 1 tablet (325 mg total) by mouth daily with breakfast. 10/12/22  Yes Cox, Kirsten, MD  fexofenadine (ALLEGRA) 180 MG tablet Take 180  mg by mouth daily.   Yes [provider]  ipratropium-albuterol (DUONEB) 0.5-2.5 (3) MG/3ML SOLN Take 3 mLs by nebulization every 4 (four) hours as needed (shortness of breath or wheezing). 09/22/21  Yes Abigail Miyamoto, MD  levothyroxine (SYNTHROID) 175 MCG tablet TAKE ONE TABLET BY MOUTH BEFORE BREAKFAST 12/06/22  Yes Cox, Kirsten, MD  Nutritional Supplements (BOOST NUTRITIONAL ENERGY PO) Take 237 mLs by mouth in the morning and at bedtime. Drinking 1-2 per day   Yes [provider]  terbinafine (LAMISIL) 250 MG tablet Take 1 tablet (250 mg total) by mouth daily. 11/17/22  Yes McCaughan, Dia D, DPM  atorvastatin (LIPITOR) 80 MG tablet TAKE ONE TABLET BY MOUTH EVERY MORNING 12/19/22   Cox, Kirsten, MD  metoprolol succinate (TOPROL-XL) 50 MG 24 hr tablet TAKE ONE TABLET BY MOUTH EVERY MORNING WITH OR immediately following A meal 12/19/22   Cox, Kirsten, MD  Multiple Vitamins-Minerals (MULTIVITAMIN ADULT PO) Take 1 tablet by mouth daily. Unknown strength Patient not taking: Reported on 12/15/2022    [provider]     Family History  Problem Relation Age of Onset   Alzheimer's disease Father    Diabetes Mother     Social History   Socioeconomic History   Marital status: Married    Spouse name: Eber Jones   Number of children: 1   Years of education: Not on file   Highest education level: Not on file  Occupational History   Occupation: Retired    Associate Professor: OTHER    Comment: Engineer, civil (consulting)  Tobacco Use   Smoking status: Former    Current packs/day: 0.00    Types: Cigarettes    Quit date: 1960    Years since quitting: 64.7   Smokeless tobacco: Former    Types: Chew    Quit date: 2011  Vaping Use   Vaping status: Never Used  Substance and Sexual Activity   Alcohol use: No   Drug use: No   Sexual activity: Not Currently  Other Topics Concern   Not on file  Social History Narrative   Not on file   Social Determinants of Health   Financial  Resource Strain: Low Risk  (03/30/2022)   Overall Financial Resource Strain (CARDIA)    Difficulty of Paying Living Expenses: Not hard at all  Food Insecurity: No Food Insecurity (12/16/2022)   Hunger Vital Sign    Worried About Running Out of Food in the Last Year: Never true    Ran Out of Food in the Last Year: Never true  Transportation Needs: No Transportation Needs (12/16/2022)   PRAPARE - Administrator, Civil Service (Medical): No    Lack of Transportation (Non-Medical): No  Physical Activity: Insufficiently Active (01/29/2021)   Exercise Vital Sign    Days of Exercise per Week: 7 days    Minutes of Exercise per Session: 10 min  Stress: No Stress Concern Present (01/29/2021)   [employer] of Occupational Health - Occupational Stress Questionnaire    Feeling of Stress : Not at all  Social Connections: Socially Integrated (01/29/2021)   Social Connection and Isolation Panel [NHANES]    Frequency of Communication with Friends and Family: Three times a week  Frequency of Social Gatherings with Friends and Family: Three times a week    Attends Religious Services: 1 to 4 times per year    Active Member of Clubs or Organizations: Yes    Attends Banker Meetings: 1 to 4 times per year    Marital Status: Married     Review of Systems  Unable to perform ROS: Mental status change    Vital Signs: BP 111/64 (BP Location: Right Arm)   Pulse 92   Temp 98.1 F (36.7 C) (Axillary)   Resp 20   Ht 6' (1.829 m)   Wt 119 lb 4.3 oz (54.1 kg)   SpO2 94%   BMI 16.18 kg/m   Advance Care Plan: The advanced care place/surrogate decision maker was discussed at the time of visit and the patient did not wish to discuss or was not able to name a surrogate decision maker or provide an advance care plan.  Physical Exam Constitutional:      Appearance: Normal appearance.     Comments: Awake  HENT:     Head: Normocephalic and atraumatic.  Cardiovascular:     Rate  and Rhythm: Normal rate.  Pulmonary:     Effort: Pulmonary effort is normal. No respiratory distress.  Abdominal:     Palpations: Abdomen is soft.     Tenderness: There is no abdominal tenderness.     Comments: No scars, nothing that would prevent Gtube placement.  Skin:    General: Skin is warm and dry.  Neurological:     General: No focal deficit present.     Mental Status: He is oriented to person, place, and time.     Imaging: CT ABDOMEN WO CONTRAST  Result Date: 01/05/2023 CLINICAL DATA:  dysphagia.  Preop planning for gastrostomy. EXAM: CT ABDOMEN WITHOUT CONTRAST TECHNIQUE: Multidetector CT imaging of the abdomen was performed following the standard protocol without IV contrast. RADIATION DOSE REDUCTION: This exam was performed according to the departmental dose-optimization program which includes automated exposure control, adjustment of the mA and/or kV according to patient size and/or use of iterative reconstruction technique. COMPARISON:  Swallow eval, 01/04/2023 and 12/28/2022. Chest XR, 01/01/2023. FINDINGS: Lower chest: 4.5 cm dilatation of the imaged ascending thoracic aorta. LAD stent. Severe burden of multivessel coronary atherosclerosis is present. Hepatobiliary: Normal noncontrast appearance of the liver without focal abnormality. Trace radiodense gallstones within the nondistended gallbladder. No gallbladder wall thickening, or biliary dilatation. Pancreas: No pancreatic ductal dilatation or surrounding inflammatory changes. Spleen: Normal in size without focal abnormality. Adrenals/Urinary Tract: Adrenal glands are unremarkable. Kidneys are normal, without renal calculi, focal lesion, or hydronephrosis. Stomach/Bowel: Stomach is nondistended and within normal limits. Small bore enteric feeding tube, with tip projecting at the proximal duodenum. Intraluminal contrast opacification of the colon, previously ingested. Imaged portions of the small bowel and colon are nondistended.  No evidence of bowel wall thickening, distention, or inflammatory changes. 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 Moderate-to-severe burden of aortic atherosclerosis without aneurysmal dilatation. No enlarged abdominal lymph nodes. Other: Scaphoid abdomen.  No intra-abdominal ascites Musculoskeletal: Degenerative changes spine, including mild S-shaped thoracolumbar curvature. No acute osseous findings. IMPRESSION: 1. No acute abdominal process. 2. No intervening viscera. Anatomy is amenable for percutaneous gastrostomy placement. Final decision making is reserved for the performing physician. 3. Incidental 4.5 cm ascending thoracic aortic dilatation, incompletely assessed. Ascending thoracic aortic aneurysm. Recommend semi-annual imaging followup by CTA or MRA and referral to cardiothoracic surgery if not already obtained This recommendation follows  2010 ACCF/AHA/AATS/ACR/ASA/SCA/SCAI/SIR/STS/SVM Guidelines for the Diagnosis and Management of Patients With Thoracic Aortic Disease. Circulation. 2010; 121: J811-B147. Aortic aneurysm NOS (ICD10-I71.9) 4. Aortic Atherosclerosis (ICD10-I70.0). Additional incidental, chronic and senescent findings as above. Roanna Banning, MD [facility] Electronically Signed   By: Roanna Banning M.D.   On: 01/05/2023 12:03   DG Swallowing Func-Speech Pathology  Result Date: 01/04/2023 Table formatting from the original result was not included. Modified Barium Swallow Study Patient Details Name: James Hammond Sr. MRN: [id number] Date of Birth: [birth year] Today's Date: 01/04/2023 HPI/PMH: HPI: Pt is an 89 y.o. male who presented with AMS after a fall. CT head: Scattered areas of subarachnoid hemorrhage in the left occipital  lobe, bilateral high frontal lobes, and right temporal lobe. Small amount of subarachnoid hemorrhage posterior to the right midbrain extending into the cerebral aqueduct. CTH  8/26 with decreased size and density of previously seen multifocal SAH. PMH: COPD, prior NSTEMI / CHF, NSVT, PAF, on ASA81. Clinical Impression: Clinical Impression: Pt presents with a moderate oropharyngeal dysphagia characterized by deficits related to awareness and reduced strength. Feel that this is in part due to cognition, although also significantly affected by anatomical differences. Overall, pt with poor awareness of bolus presentations, leaving mouth wide open when presented with a spoon, straw, or cup and requiring Max cueing to close mouth and initiate. With larger boluses, there is posterior progression to the level of the pyriform sinuses before he initiates the swallow. He is consistently piecemeal swallowing which affects his overall coordination. Pt presents with reduced pharyngeal strength and suspected prominent cervical osteophytes create pharyngeal narrowing at the level of the valleculae, contributing to no noted epiglottic inversion. This may also be affected by Cortrak in place, although feel this is not an acute difference. Incomplete epiglottic inversion allows the laryngeal vestibule to be open during the swallowing contributing to penetration of all consistencies during the swallow as well as after. With thicker consistencies, pt has increased pharyngeal residue collecting along the base of tongue, valleculae, pharyngeal wall, and aryepiglottic folds. Due to pt's reduced sensation and strength, this residue progresses into the laryngeal vestibule when pt is at rest. Observed penetration to the level of the vocal folds without sensation and without ability to clear after a cued cough (PAS 5) with thin liquids, nectar thick liquids, and honey thick liquids. Pureed textures were penetrated above the vocal folds and were not able to be expelled (PAS 3) with concern for continual progression through the trachea without sensation. Overall, feel that pt's cognition does not allow for consistent  use of compensatory strategies at this time. Recommend he remain strictly NPO. Will continue to follow. Factors that may increase risk of adverse event in presence of aspiration Rubye Oaks & Clearance Coots 2021): Factors that may increase risk of adverse event in presence of aspiration Rubye Oaks & Clearance Coots 2021): Poor general health and/or compromised immunity; Reduced cognitive function; Frail or deconditioned; Dependence for feeding and/or oral hygiene; Inadequate oral hygiene Recommendations/Plan: Swallowing Evaluation Recommendations Swallowing Evaluation Recommendations Recommendations: NPO Medication Administration: Via alternative means Treatment Plan Treatment Plan Treatment recommendations: Therapy as outlined in treatment plan below Follow-up recommendations: Skilled nursing-short term rehab (<3 hours/day) Functional status assessment: Patient has had a recent decline in their functional status and demonstrates the ability to make significant improvements in function in a reasonable and predictable amount of time. Treatment frequency: Min 2x/week Treatment duration: 2 weeks Interventions: Aspiration precaution training; Trials of upgraded texture/liquids; Diet toleration management by SLP; Patient/family  education Recommendations Recommendations for follow up therapy are one component of a multi-disciplinary discharge planning process, led by the attending physician.  Recommendations may be updated based on patient status, additional functional criteria and insurance authorization. Assessment: Orofacial Exam: Orofacial Exam Oral Cavity: Oral Hygiene: WFL Oral Cavity - Dentition: Poor condition; Missing dentition; Dentures, top; Dentures, not available Orofacial Anatomy: WFL Oral Motor/Sensory Function: WFL Anatomy: Anatomy: Suspected cervical osteophytes Boluses Administered: Boluses Administered Boluses Administered: Thin liquids (Level 0); Mildly thick liquids (Level 2, nectar thick); Moderately thick liquids (Level  3, honey thick); Puree  Oral Impairment Domain: Oral Impairment Domain Lip Closure: Escape from interlabial space or lateral juncture, no extension beyond vermillion border Tongue control during bolus hold: Posterior escape of greater than half of bolus Bolus transport/lingual motion: Delayed initiation of tongue motion (oral holding) Oral residue: Residue collection on oral structures Location of oral residue : Tongue; Palate Initiation of pharyngeal swallow : Pyriform sinuses  Pharyngeal Impairment Domain: Pharyngeal Impairment Domain Soft palate elevation: No bolus between soft palate (SP)/pharyngeal wall (PW) Laryngeal elevation: Complete superior movement of thyroid cartilage with complete approximation of arytenoids to epiglottic petiole Anterior hyoid excursion: Partial anterior movement Epiglottic movement: No inversion Laryngeal vestibule closure: Incomplete, narrow column air/contrast in laryngeal vestibule Pharyngeal stripping wave : Present - diminished Pharyngeal contraction (A/P view only): N/A Pharyngoesophageal segment opening: Minimal distention/minimal duration, marked obstruction of flow Tongue base retraction: Narrow column of contrast or air between tongue base and PPW Pharyngeal residue: Majority of contrast within or on pharyngeal structures Location of pharyngeal residue: Valleculae; Pharyngeal wall; Pyriform sinuses; Aryepiglottic folds  Esophageal Impairment Domain: No data recorded Pill: No data recorded Penetration/Aspiration Scale Score: Penetration/Aspiration Scale Score 3.  Material enters airway, remains ABOVE vocal cords and not ejected out: Puree 5.  Material enters airway, CONTACTS cords and not ejected out: Thin liquids (Level 0); Mildly thick liquids (Level 2, nectar thick); Moderately thick liquids (Level 3, honey thick) Compensatory Strategies: Compensatory Strategies Compensatory strategies: Yes Straw: Ineffective Ineffective Straw: Thin liquid (Level 0)   General Information:  Caregiver present: No  Diet Prior to this Study: NPO; Cortrak/Small bore NG tube   Temperature : Normal   Respiratory Status: WFL   Supplemental O2: None (Room air)   History of Recent Intubation: No  Behavior/Cognition: Alert; Cooperative; Confused; Requires cueing Self-Feeding Abilities: Dependent for feeding Baseline vocal quality/speech: Dysphonic; Hypophonia/low volume Volitional Cough: Able to elicit Volitional Swallow: Unable to elicit Exam Limitations: Excessive movement Goal Planning: Prognosis for improved oropharyngeal function: Fair Barriers to Reach Goals: Cognitive deficits; Severity of deficits; Time post onset; Overall medical prognosis No data recorded Patient/Family Stated Goal: none stated Consulted and agree with results and recommendations: Pt unable/family or caregiver not available Pain: Pain Assessment Pain Assessment: No/denies pain Breathing: 0 Negative Vocalization: 0 Facial Expression: 0 Body Language: 0 Consolability: 0 PAINAD Score: 0 End of Session: Start Time:SLP Start Time (ACUTE ONLY): 1121 Stop Time: SLP Stop Time (ACUTE ONLY): 1138 Time Calculation:SLP Time Calculation (min) (ACUTE ONLY): 17 min Charges: SLP Evaluations $ SLP Speech Visit: 1 Visit SLP Evaluations $MBS Swallow: 1 Procedure $Swallowing Treatment: 1 Procedure $Speech Treatment for Individual: 1 Procedure SLP visit diagnosis: SLP Visit Diagnosis: Dysphagia, oropharyngeal phase (R13.12); Cognitive communication deficit (R41.841) Past Medical History: Past Medical History: Diagnosis Date  Acquired hypothyroidism 08/15/2019  Acute systolic heart failure (HCC) 06/25/2020  Anemia 02/05/2020  boarderline anemic  Atopic dermatitis 12/05/2019  Bacterial pneumonia, unspecified 06/14/2012  06/14/2012 CXR w/ RML PNA >Zithromax and Rocephin  Bradycardia 11/28/2018  CAD (coronary artery disease) 07/07/2020  Cancer (HCC)   prostate cancer-seed implant  Closed fracture dislocation of right elbow   Closed fracture of right olecranon process  04/10/2020  COPD (chronic obstructive pulmonary disease) (HCC) 01/21/2020  Dermatitis 02/05/2020  Dizziness 02/19/2016  Essential hypertension 11/28/2018  History of prostate cancer   seed implant  Hyperlipemia   Hyperlipidemia, mixed 08/21/2008  Qualifier: Diagnosis of  By: Maple Hudson MD, Clinton D   LVH (left ventricular hypertrophy) 11/28/2018  Malnutrition of moderate degree (HCC) 08/15/2019  Mixed conductive and sensorineural hearing loss, 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  Moderate persistent asthma, uncomplicated 01/21/2020  Nonsustained ventricular tachycardia (HCC) 01/30/2019  NSTEMI (non-ST elevated myocardial infarction) (HCC) 06/21/2020  Other fatigue 02/05/2020  PAF (paroxysmal atrial fibrillation) (HCC) 06/25/2020  Seasonal and perennial allergic rhinitis 05/17/2007     Senile osteoporosis 08/15/2019  SINUSITIS, ACUTE 06/28/2007  Qualifier: Diagnosis of  By: Clent Ridges NP, Tammy    Ventricular ectopy 11/28/2018 Past Surgical History: Past Surgical History: Procedure Laterality Date  APPENDECTOMY    CORONARY ATHERECTOMY N/A 06/24/2020  Procedure: CORONARY ATHERECTOMY;  Surgeon: Iran Ouch, MD;  Location: [facility];  Service: Cardiovascular;  Laterality: N/A;  CORONARY ULTRASOUND/IVUS N/A 06/24/2020  Procedure: Intravascular Ultrasound/IVUS;  Surgeon: Iran Ouch, MD;  Location: [facility];  Service: Cardiovascular;  Laterality: N/A;  LEFT HEART CATH AND CORONARY ANGIOGRAPHY N/A 06/22/2020  Procedure: LEFT HEART CATH AND CORONARY ANGIOGRAPHY;  Surgeon: Yvonne Kendall, MD;  Location: [facility];  Service: Cardiovascular;  Laterality: N/A;  ORIF ELBOW FRACTURE Right 04/10/2020  Procedure: OPEN REDUCTION INTERNAL FIXATION (ORIF) ELBOW/OLECRANON FRACTURE;  Surgeon: Teryl Lucy, MD;  Location: [facility];  Service: Orthopedics;  Laterality: Right; James Aliment, M.A., CF-SLP Speech Language Pathology, Acute Rehabilitation Services Secure Chat preferred [phone] 01/04/2023, 1:45 PM  CT HEAD WO  CONTRAST ( )  Result Date: 01/02/2023 CLINICAL DATA:  Mental status change, persistent or worsening CT EXAM: CT HEAD WITHOUT CONTRAST TECHNIQUE: Contiguous axial images were obtained from the base of the skull through the vertex without intravenous contrast. RADIATION DOSE REDUCTION: This exam was performed according to the departmental dose-optimization program which includes automated exposure control, adjustment of the mA and/or kV according to patient size and/or use of iterative reconstruction technique. COMPARISON:  Head 12/20/2022. FINDINGS: Brain: Decreased size and density of previously seen subarachnoid hemorrhage with a few areas of persistent small hyperdensity. Intermediate density right subdural hemorrhage is decreased in thickness, now 7 mm on the right and 3 mm on the left (previously 9 and 11 mm). No substantial midline shift. No evidence of acute large vascular territory infarct, mass lesion or hydrocephalus. Cerebral atrophy and chronic microvascular ischemic change. Basal ganglia and cerebellar calcifications. Vascular: No hyperdense vessel identified. Calcific atherosclerosis. Skull: No acute fracture. Sinuses/Orbits: Clear sinuses.  No acute orbital findings. Other: No mastoid effusions. IMPRESSION: 1. Intermediate density bilateral subdural hemorrhage is decreased in thickness, now 7 mm on the right and 3 mm on the left (previously 9 and 11 mm when measured on that study). 2. Decreased size and density of previously seen multifocal subarachnoid hemorrhage. Electronically Signed   By: Feliberto Harts M.D.   On: 01/02/2023 09:27   DG CHEST PORT 1 VIEW  Result Date: 01/01/2023 CLINICAL DATA:  Cough EXAM: PORTABLE CHEST 1 VIEW COMPARISON:  12/26/2022 FINDINGS: Atherosclerotic calcification of the aortic arch. Coronary artery stent noted. Heart size within normal limits. The lungs appear clear.  No blunting of the costophrenic angles.  Severe degenerative glenohumeral arthropathy on the  right with elimination of the acromial humeral distance suggesting chronic right rotator cuff tear. A feeding tube extends into the stomach and beyond the inferior margin of today's image. IMPRESSION: 1. No acute findings. 2. Feeding tube extends into the stomach and beyond the inferior margin of today's image. 3. Severe degenerative glenohumeral arthropathy on the right with chronic right rotator cuff tear. Electronically Signed   By: Gaylyn Rong M.D.   On: 01/01/2023 13:21   DG Swallowing Func-Speech Pathology  Result Date: 12/28/2022 Blaine Hamper     12/28/2022  5:38 PM Modified Barium Swallow Study Patient Details Name: James Harne Sr. MRN: [id number] Date of Birth: [birth year] Today's Date: 12/28/2022 HPI/PMH: HPI: Pt is an 89 y.o. male who presented with AMS after a fall. CT head: Scattered areas of subarachnoid hemorrhage in the left occipital  lobe, bilateral high frontal lobes, and right temporal lobe. Small amount of subarachnoid hemorrhage posterior to the right midbrain extending into the cerebral aqueduct. PMH: COPD, prior NSTEMI / CHF, NSVT, PAF, on ASA81. Clinical Impression: Clinical Impression: Patient presents with a mild-moderate oral dysphagia and a moderately impaired pharyngeal phase dysphagia as per this MBS. SLP suspects that patient has a chronic dysphagia that is exacerbated by his current cognitive state. During oral phase, patient with delayed anterior to posterior transit of boluses as well as impaired awareness to boluses, leading him to bite at spoon, hold liquids in mouth and have difficulty initiating straw sips. SLP assessed his swallowing with thin liquids and nectar thick liquids. Cortrak feeding tube was present during this test. Epiglottis appeared somewhat edematous and in addition, posterior pharyngeal wall was bulging (suspected cervical osteophytes but no radiologist present to confirm). This resulted in epiglottis making contact with posterior  pharyngeal wall during swallow which in turn caused impacted pharyngeal clearance of boluses. After initial swallows of nectar thick and thin liquids, patient with moderate amount of barium remaining in vallecular sinus, posterior pharyngeal wall and pyriform sinus. Patient was frequently moving around and sliding down in chair, which prevented a clear view of PES or trachea. There was an instance of penetration above vocal cords visualized one time and suspected to be occuring frequently. SLP recommending continue NPO at this time but allow ice chips, water sips PRN. Plan for repeat MBS when patient's cognition has improved. Factors that may increase risk of adverse event in presence of aspiration [employer]ye Oaks [employer] Clearance Coots 2021): Factors that may increase risk of adverse event in presence of aspiration [employer]ye Oaks [employer] Clearance Coots 2021): Poor general health and/or compromised immunity; Reduced cognitive function; Frail or deconditioned; Dependence for feeding and/or oral hygiene; Inadequate oral hygiene Recommendations/Plan: Swallowing Evaluation Recommendations Swallowing Evaluation Recommendations Recommendations: NPO Medication Administration: Via alternative means Oral care recommendations: Oral care QID (4x/day); Oral care before ice chips/water; Staff/trained caregiver to provide oral care Treatment Plan Treatment Plan Treatment recommendations: Therapy as outlined in treatment plan below Follow-up recommendations: Skilled nursing-short term rehab (<3 hours/day) Functional status assessment: Patient has had a recent decline in their functional status and demonstrates the ability to make significant improvements in function in a reasonable and predictable amount of time. Treatment frequency: Min 2x/week Treatment duration: 2 weeks Interventions: Aspiration precaution training; Trials of upgraded texture/liquids; Diet toleration management by SLP; Patient/family education Recommendations Recommendations for follow up therapy are  one component of a multi-disciplinary discharge planning process, led by the attending physician.  Recommendations may be updated based on patient status, additional functional criteria  and insurance authorization. Assessment: Orofacial Exam: Orofacial Exam Oral Cavity: Oral Hygiene: WFL Oral Cavity - Dentition: Poor condition; Missing dentition; Dentures, top; Dentures, not available Orofacial Anatomy: WFL Anatomy: Anatomy: Suspected cervical osteophytes Boluses Administered: Boluses Administered Boluses Administered: Thin liquids (Level 0); Mildly thick liquids (Level 2, nectar thick)  Oral Impairment Domain: Oral Impairment Domain Lip Closure: No labial escape Tongue control during bolus hold: Not tested Bolus preparation/mastication: Slow prolonged chewing/mashing with complete recollection Bolus transport/lingual motion: Delayed initiation of tongue motion (oral holding) Oral residue: Residue collection on oral structures Location of oral residue : Tongue; Palate Initiation of pharyngeal swallow : Valleculae  Pharyngeal Impairment Domain: Pharyngeal Impairment Domain Laryngeal vestibule closure: None, wide column air/contrast in laryngeal vestibule Pharyngeal stripping wave : Present - diminished Pharyngeal contraction (A/P view only): N/A Pharyngoesophageal segment opening: Partial distention/partial duration, partial obstruction of flow Tongue base retraction: Narrow column of contrast or air between tongue base and PPW Pharyngeal residue: Majority of contrast within or on pharyngeal structures Location of pharyngeal residue: Valleculae; Pharyngeal wall; Pyriform sinuses; Aryepiglottic folds  Esophageal Impairment Domain: No data recorded Pill: No data recorded Penetration/Aspiration Scale Score: Penetration/Aspiration Scale Score 3.  Material enters airway, remains ABOVE vocal cords and not ejected out: Mildly thick liquids (Level 2, nectar thick); Thin liquids (Level 0) Compensatory Strategies: Compensatory  Strategies Compensatory strategies: No   General Information: Caregiver present: No  Diet Prior to this Study: NPO   Temperature : Normal   Respiratory Status: WFL   Supplemental O2: None (Room air)   History of Recent Intubation: No  Behavior/Cognition: Alert; Cooperative; Confused; Requires cueing; Doesn't follow directions Self-Feeding Abilities: Dependent for feeding Baseline vocal quality/speech: Not observed Volitional Cough: Unable to elicit Volitional Swallow: Unable to elicit Exam Limitations: Poor participation Goal Planning: Prognosis for improved oropharyngeal function: Good Barriers to Reach Goals: Cognitive deficits; Severity of deficits No data recorded Patient/Family Stated Goal: none stated Consulted and agree with results and recommendations: Pt unable/family or caregiver not available Pain: Pain Assessment Pain Assessment: Faces Faces Pain Scale: 0 Breathing: 0 Negative Vocalization: 0 Facial Expression: 0 Body Language: 0 Consolability: 0 PAINAD Score: 0 Pain Location: L leg with PROM Pain Descriptors / Indicators: Discomfort; Grimacing; Guarding Pain Intervention(s): Limited activity within patient's tolerance; Monitored during session; Repositioned End of Session: Start Time:SLP Start Time (ACUTE ONLY): 1400 Stop Time: SLP Stop Time (ACUTE ONLY): 1417 Time Calculation:SLP Time Calculation (min) (ACUTE ONLY): 17 min Charges: SLP Evaluations $ SLP Speech Visit: 1 Visit SLP Evaluations $Swallowing Treatment: 1 Procedure SLP visit diagnosis: SLP Visit Diagnosis: Dysphagia, oropharyngeal phase (R13.12) Past Medical History: Past Medical History: Diagnosis Date  Acquired hypothyroidism 08/15/2019  Acute systolic heart failure (HCC) 06/25/2020  Anemia 02/05/2020  boarderline anemic  Atopic dermatitis 12/05/2019  Bacterial pneumonia, unspecified 06/14/2012  06/14/2012 CXR w/ RML PNA >Zithromax and Rocephin    Bradycardia 11/28/2018  CAD (coronary artery disease) 07/07/2020  Cancer (HCC)   prostate cancer-seed  implant  Closed fracture dislocation of right elbow   Closed fracture of right olecranon process 04/10/2020  COPD (chronic obstructive pulmonary disease) (HCC) 01/21/2020  Dermatitis 02/05/2020  Dizziness 02/19/2016  Essential hypertension 11/28/2018  History of prostate cancer   seed implant  Hyperlipemia   Hyperlipidemia, mixed 08/21/2008  Qualifier: Diagnosis of  By: Maple Hudson MD, Clinton D   LVH (left ventricular hypertrophy) 11/28/2018  Malnutrition of moderate degree (HCC) 08/15/2019  Mixed conductive and sensorineural hearing loss, 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  Moderate persistent asthma, uncomplicated 01/21/2020  Nonsustained ventricular tachycardia (  HCC) 01/30/2019  NSTEMI (non-ST elevated myocardial infarction) (HCC) 06/21/2020  Other fatigue 02/05/2020  PAF (paroxysmal atrial fibrillation) (HCC) 06/25/2020  Seasonal and perennial allergic rhinitis 05/17/2007     Senile osteoporosis 08/15/2019  SINUSITIS, ACUTE 06/28/2007  Qualifier: Diagnosis of  By: Clent Ridges NP, Tammy    Ventricular ectopy 11/28/2018 Past Surgical History: Past Surgical History: Procedure Laterality Date  APPENDECTOMY    CORONARY ATHERECTOMY N/A 06/24/2020  Procedure: CORONARY ATHERECTOMY;  Surgeon: Iran Ouch, MD;  Location: [facility];  Service: Cardiovascular;  Laterality: N/A;  CORONARY ULTRASOUND/IVUS N/A 06/24/2020  Procedure: Intravascular Ultrasound/IVUS;  Surgeon: Iran Ouch, MD;  Location: [facility];  Service: Cardiovascular;  Laterality: N/A;  LEFT HEART CATH AND CORONARY ANGIOGRAPHY N/A 06/22/2020  Procedure: LEFT HEART CATH AND CORONARY ANGIOGRAPHY;  Surgeon: Yvonne Kendall, MD;  Location: [facility];  Service: Cardiovascular;  Laterality: N/A;  ORIF ELBOW FRACTURE Right 04/10/2020  Procedure: OPEN REDUCTION INTERNAL FIXATION (ORIF) ELBOW/OLECRANON FRACTURE;  Surgeon: Teryl Lucy, MD;  Location: [facility];  Service: Orthopedics;  Laterality: Right; Angela Nevin, MA, CCC-SLP Speech Therapy   DG  CHEST PORT 1 VIEW  Result Date: 12/26/2022 CLINICAL DATA:  Fever EXAM: PORTABLE CHEST 1 VIEW COMPARISON:  12/17/2022 FINDINGS: Mild right basilar atelectasis. Lungs are otherwise clear. No pneumothorax or pleural effusion. Cardiac size is within normal limits. Previously noted trace perihilar interstitial pulmonary edema has resolved. Nasoenteric feeding tube has been placed, extending into the upper abdomen beyond the margin of the examination. IMPRESSION: 1. Mild right basilar atelectasis. Electronically Signed   By: Helyn Numbers M.D.   On: 12/26/2022 19:39   CT HEAD WO CONTRAST ( )  Result Date: 12/20/2022 CLINICAL DATA:  Subarachnoid hemorrhage [facility]) EXAM: CT HEAD WITHOUT CONTRAST TECHNIQUE: Contiguous axial images were obtained from the base of the skull through the vertex without intravenous contrast. RADIATION DOSE REDUCTION: This exam was performed according to the departmental dose-optimization program which includes automated exposure control, adjustment of the mA and/or kV according to patient size and/or use of iterative reconstruction technique. COMPARISON:  CT head 12/15/2022 FINDINGS: Brain: Cerebral ventricle sizes are concordant with the degree of cerebral volume loss. Patchy and confluent areas of decreased attenuation are noted throughout the deep and periventricular white matter of the cerebral hemispheres bilaterally, compatible with chronic microvascular ischemic disease. No evidence of large-territorial acute infarction. Question slightly increased in size (limited evaluation due to motion artifact) high frontal bilateral, right greater than left, subarachnoid hemorrhages with question intraparenchymal component. Associated developing mild vasogenic edema on the right. Underlying mass is not excluded. Interval development of a 3 mm right parafalcine subdural hematoma (5:22). Nonspecific bilateral basal ganglia mineralization. No mass effect or midline shift. No hydrocephalus. Basilar  cisterns are patent. Vascular: No hyperdense vessel. Atherosclerotic calcifications are present within the cavernous internal carotid arteries. Skull: No acute fracture or focal lesion. Sinuses/Orbits: Paranasal sinuses and mastoid air cells are clear. Bilateral lens replacement. Otherwise the orbits are unremarkable. Other: None. IMPRESSION: 1. Interval development of a 3 mm right parafalcine subdural hematoma. 2. Question slightly increased in size (limited evaluation due to motion artifact) high frontal bilateral, right greater than left, subarachnoid hemorrhages with question intraparenchymal component. Associated developing mild vasogenic edema on the right. Underlying mass is not excluded. Recommend MRI brain with and without contrast for further evaluation. 3. Unable to evaluate for previously visualized left occipital and right temporal cirrhotic no hemorrhages due to motion artifact. These results will be called  to the ordering clinician or representative by the Radiologist Assistant, and communication documented in the PACS or [employer]. Electronically Signed   By: Tish Frederickson M.D.   On: 12/20/2022 19:22   ECHOCARDIOGRAM COMPLETE  Result Date: 12/19/2022    ECHOCARDIOGRAM REPORT   Patient Name:   James Ortegon Sr. Date of Exam: 12/19/2022 Medical Rec #:  [id number]             Height:       72.0 in Accession #:    [id number]            Weight:       140.0 lb Date of Birth:  07-01-1931              BSA:          1.831 m Patient Age:    89 years              BP:           160/92 mmHg Patient Gender: M                     HR:           97 bpm. Exam Location:  Inpatient Procedure: 2D Echo, Cardiac Doppler and Color Doppler Indications:    Atrial Fibrillation I48.91  History:        Patient has prior history of Echocardiogram examinations, most                 recent 06/23/2020. CHF and Cardiomyopathy, CAD and Previous                 Myocardial Infarction, COPD, Arrythmias:PVC, Tachycardia and                  Atrial Fibrillation; Risk Factors:Hypertension.  Sonographer:    Lucendia Herrlich Referring Phys: [id number] POKHREL  Sonographer Comments: Image acquisition challenging due to patient behavioral factors. IMPRESSIONS  1. Left ventricular ejection fraction, by estimation, is 50 to 55%. The left ventricle has low normal function. The left ventricle has no regional wall motion abnormalities. Left ventricular diastolic parameters are indeterminate.  2. Right ventricular systolic function is normal. The right ventricular size is normal.  3. The mitral valve is normal in structure. Moderate mitral valve regurgitation. No evidence of mitral stenosis.  4. The aortic valve is normal in structure. Aortic valve regurgitation is not visualized. Mild aortic valve stenosis.  5. There is mild dilatation of the aortic root, measuring 37 mm.  6. The inferior vena cava is normal in size with greater than 50% respiratory variability, suggesting right atrial pressure of 3 mmHg. FINDINGS  Left Ventricle: Left ventricular ejection fraction, by estimation, is 50 to 55%. The left ventricle has low normal function. The left ventricle has no regional wall motion abnormalities. The left ventricular internal cavity size was normal in size. There is no left ventricular hypertrophy. Left ventricular diastolic parameters are indeterminate. Right Ventricle: The right ventricular size is normal. No increase in right ventricular wall thickness. Right ventricular systolic function is normal. Left Atrium: Left atrial size was normal in size. Right Atrium: Right atrial size was normal in size. Pericardium: There is no evidence of pericardial effusion. Mitral Valve: The mitral valve is normal in structure. Moderate mitral valve regurgitation. No evidence of mitral valve stenosis. Tricuspid Valve: The tricuspid valve is normal in structure. Tricuspid valve regurgitation is not demonstrated. No evidence of tricuspid stenosis. Aortic  Valve:  The aortic valve is normal in structure. Aortic valve regurgitation is not visualized. Aortic regurgitation PHT measures 740 msec. Mild aortic stenosis is present. Aortic valve mean gradient measures 9.0 mmHg. Aortic valve peak gradient measures 17.6 mmHg. Aortic valve area, by VTI measures 1.66 cm. Pulmonic Valve: The pulmonic valve was normal in structure. Pulmonic valve regurgitation is not visualized. No evidence of pulmonic stenosis. Aorta: There is mild dilatation of the aortic root, measuring 37 mm. Venous: The inferior vena cava is normal in size with greater than 50% respiratory variability, suggesting right atrial pressure of 3 mmHg. IAS/Shunts: No atrial level shunt detected by color flow Doppler.  LEFT VENTRICLE PLAX 2D LVIDd:         4.00 cm   Diastology LVIDs:         2.90 cm   LV e' medial:    12.40 cm/s LV PW:         0.70 cm   LV E/e' medial:  9.0 LV IVS:        1.00 cm   LV e' lateral:   13.50 cm/s LVOT diam:     2.00 cm   LV E/e' lateral: 8.2 LV SV:         52 LV SV Index:   28 LVOT Area:     3.14 cm  RIGHT VENTRICLE             IVC RV S prime:     20.50 cm/s  IVC diam: 1.35 cm TAPSE (M-mode): 1.8 cm LEFT ATRIUM             Index        RIGHT ATRIUM           Index LA diam:        4.00 cm 2.18 cm/m   RA Area:     16.50 cm LA Vol (A2C):   49.4 ml 26.98 ml/m  RA Volume:   40.00 ml  21.85 ml/m LA Vol (A4C):   52.5 ml 28.67 ml/m LA Biplane Vol: 51.2 ml 27.96 ml/m  AORTIC VALVE AV Area (Vmax):    1.49 cm AV Area (Vmean):   1.46 cm AV Area (VTI):     1.66 cm AV Vmax:           210.00 cm/s AV Vmean:          136.000 cm/s AV VTI:            0.311 m AV Peak Grad:      17.6 mmHg AV Mean Grad:      9.0 mmHg LVOT Vmax:         99.40 cm/s LVOT Vmean:        63.367 cm/s LVOT VTI:          0.165 m LVOT/AV VTI ratio: 0.53 AI PHT:            740 msec  AORTA Ao Root diam: 3.70 cm MITRAL VALVE                TRICUSPID VALVE MV Area (PHT): 5.31 cm     TR Peak grad:   36.7 mmHg MV Decel Time: 143  msec     TR Vmax:        303.00 cm/s MR Peak grad: 147.4 mmHg MR Vmax:      607.00 cm/s   SHUNTS MV E velocity: 111.00 cm/s  Systemic VTI:  0.16 m MV A velocity: 123.00 cm/s  Systemic Diam: 2.00 cm  MV E/A ratio:  0.90 Kardie Tobb DO Electronically signed by Thomasene Ripple DO Signature Date/Time: 12/19/2022/1:37:51 PM    Final    DG Abd Portable 1V  Result Date: 12/19/2022 CLINICAL DATA:  Feeding tube placement EXAM: PORTABLE ABDOMEN - 1 VIEW COMPARISON:  Chest radiograph done on 12/17/2022 FINDINGS: Tip of feeding tube is seen in the region of the antrum of the stomach. Bowel gas pattern in the upper abdomen is unremarkable. Pelvis is not included in its entirety. Right side of the abdomen is not included in the image. Visualized lung fields are clear. Coronary artery stent is seen. IMPRESSION: Tip of feeding tube is seen in the region of the antrum of the stomach. Electronically Signed   By: Ernie Avena M.D.   On: 12/19/2022 11:05   DG CHEST PORT 1 VIEW  Result Date: 12/17/2022 CLINICAL DATA:  Fevers EXAM: PORTABLE CHEST 1 VIEW COMPARISON:  12/15/2022 FINDINGS: Cardiac shadow is stable. Aortic calcifications are noted. The lungs are well aerated bilaterally. No focal infiltrate or effusion is seen. No bony abnormality is noted. IMPRESSION: No acute abnormality seen. Electronically Signed   By: Alcide Clever M.D.   On: 12/17/2022 22:15   CT HEAD WO CONTRAST ( )  Result Date: 12/15/2022 CLINICAL DATA:  Fall.  Subarachnoid hemorrhage. EXAM: CT HEAD WITHOUT CONTRAST TECHNIQUE: Contiguous axial images were obtained from the base of the skull through the vertex without intravenous contrast. RADIATION DOSE REDUCTION: This exam was performed according to the departmental dose-optimization program which includes automated exposure control, adjustment of the mA and/or kV according to patient size and/or use of iterative reconstruction technique. COMPARISON:  Earlier CT dated 12/15/2022. FINDINGS: Evaluation  of this exam is limited due to motion artifact. Brain: Mild age-related atrophy and moderate chronic microvascular ischemic changes. No significant interval change in the intracranial hemorrhages seen on the earlier CT. No new hemorrhage. No mass effect or midline shift. Vascular: No hyperdense vessel or unexpected calcification. Skull: Normal. Negative for fracture or focal lesion. Sinuses/Orbits: No acute finding. Other: Laceration of the right forehead. IMPRESSION: No significant interval change in the intracranial hemorrhages seen on the earlier CT. Continued close follow-up recommended. No new hemorrhage. Electronically Signed   By: Elgie Collard M.D.   On: 12/15/2022 21:09   DG Pelvis Portable  Result Date: 12/15/2022 CLINICAL DATA:  Mechanical fall hitting head on counter. EXAM: PORTABLE PELVIS 1-2 VIEWS COMPARISON:  None Available. FINDINGS: The cortical margins of the bony pelvis are intact. No fracture. Pubic symphysis and sacroiliac joints are congruent. Both femoral heads are well-seated in the respective acetabula. Brachytherapy seeds in the prostate. There is a sclerotic focus in the left pelvis above the acetabulum. IMPRESSION: 1. No pelvic fracture. 2. Sclerotic focus in the left pelvis is nonspecific in the setting of prior prostate cancer. Recommend correlation with any outside imaging if available to assess for stability. Electronically Signed   By: Narda Rutherford M.D.   On: 12/15/2022 19:41   DG Chest Port 1 View  Result Date: 12/15/2022 CLINICAL DATA:  Mechanical fall hitting head on counter. EXAM: PORTABLE CHEST 1 VIEW COMPARISON:  06/21/2020 FINDINGS: The heart is normal in size. Coronary stent visualized. Mediastinal contours are unchanged. No pneumothorax, large pleural effusion, or focal airspace disease. On limited assessment, no acute osseous abnormalities are seen. IMPRESSION: No acute chest findings. Electronically Signed   By: Narda Rutherford M.D.   On: 12/15/2022 19:34    CT HEAD WO CONTRAST  Result Date: 12/15/2022 CLINICAL DATA:  Trauma EXAM: CT HEAD WITHOUT CONTRAST TECHNIQUE: Contiguous axial images were obtained from the base of the skull through the vertex without intravenous contrast. RADIATION DOSE REDUCTION: This exam was performed according to the departmental dose-optimization program which includes automated exposure control, adjustment of the mA and/or kV according to patient size and/or use of iterative reconstruction technique. COMPARISON:  Head CT 06/03/2013 FINDINGS: Brain: There are scattered areas of subarachnoid hemorrhage in the left occipital lobe, bilateral high frontal lobes, and right temporal lobe. Small amount of subarachnoid hemorrhage is noted posterior to the right midbrain extending into the cerebral aqueduct. No extra-axial fluid collection or acute infarct. No mass effect or midline shift. No hydrocephalus. There is new mild periventricular white matter hypodensity, likely chronic small vessel ischemic change. Vascular: Atherosclerotic calcifications are present within the cavernous internal carotid arteries. Skull: Normal. Negative for fracture or focal lesion. Sinuses/Orbits: No acute finding. Other: None. IMPRESSION: 1. Scattered areas of subarachnoid hemorrhage in the left occipital lobe, bilateral high frontal lobes, and right temporal lobe. 2. Small amount of subarachnoid hemorrhage posterior to the right midbrain extending into the cerebral aqueduct. No hydrocephalus. These results were called by telephone at the time of interpretation on 12/15/2022 at 7:17 pm to provider [facility] , who verbally acknowledged these results. Electronically Signed   By: Darliss Cheney M.D.   On: 12/15/2022 19:17   CT CERVICAL SPINE WO CONTRAST  Result Date: 12/15/2022 CLINICAL DATA:  Polytrauma, blunt EXAM: CT CERVICAL SPINE WITHOUT CONTRAST TECHNIQUE: Multidetector CT imaging of the cervical spine was performed without intravenous contrast. Multiplanar  CT image reconstructions were also generated. RADIATION DOSE REDUCTION: This exam was performed according to the departmental dose-optimization program which includes automated exposure control, adjustment of the mA and/or kV according to patient size and/or use of iterative reconstruction technique. COMPARISON:  None Available. FINDINGS: Alignment: Slight degenerative anterolisthesis of C7 on T1 and T1 on T2. Skull base and vertebrae: No acute fracture. No primary bone lesion or focal pathologic process. Soft tissues and spinal canal: No prevertebral fluid or swelling. No visible canal hematoma. Disc levels: Bony fusion across the disc spaces from C3-4 through C5-6. Degenerative disc disease at C6-7. Advanced bilateral degenerative facet disease. Upper chest: No acute findings Other: None IMPRESSION: Degenerative disc and facet disease.  Fusion from C3-4 through C5-6. No acute bony abnormality. Electronically Signed   By: Charlett Nose M.D.   On: 12/15/2022 19:07    Labs:  CBC: Recent Labs    01/01/23 0414 01/02/23 0620 01/04/23 1321 01/05/23 0259  WBC 14.0* 14.3* 16.2* 15.8*  HGB 10.9* 11.4* 11.0* 11.5*  HCT 33.8* 36.3* 34.9* 36.7*  PLT 530* 508* 491* 508*    COAGS: Recent Labs    12/17/22 2025 01/05/23 0904  INR 1.1 1.0    BMP: Recent Labs    01/01/23 0414 01/02/23 0620 01/04/23 1321 01/05/23 0259  NA 129* 133* 131* 132*  K 4.5 4.8 4.2 4.3  CL 100 99 101 100  CO2 20* 22 21* 23  GLUCOSE 115* 120* 142* 124*  BUN 24* 23 31* 28*  CALCIUM 8.4* 8.7* 8.4* 8.7*  CREATININE 0.75 0.65 0.69 0.73  GFRNONAA >60 >60 >60 >60    LIVER FUNCTION TESTS: Recent Labs    07/08/22 1154 12/15/22 1840 12/17/22 2025  BILITOT <0.2 0.3 0.7  AST 24 25 20   ALT 21 21 18   ALKPHOS 62 49 49  PROT 6.8 6.5 6.8  ALBUMIN 4.4 3.4* 3.0*    TUMOR MARKERS: No  results for input(s): "AFPTM", "CEA", "CA199", "CHROMGRNA" in the last 8760 hours.  Assessment and Plan:  Traumatic subarachnoid  hemorrhage - dysphagia (Cortrak in place currently).  Images reviewed - Ok for placement of a gastrostomy tube when WBC trending down and patient has been afebrile x 24 hours.  Will need to hold SQ Heparin at least 8 hours prior to procedure. Will hold SQ Heparin in am in case patient remains afebrile and WBC starts to trend down. If not, then procedure will not be done until next Tuesday due to Holiday weekend.  Risks and benefits image guided gastrostomy tube placement was discussed with the patient's daughter including, but not limited to the need for a barium enema during the procedure, bleeding, infection, peritonitis and/or damage to adjacent structures.  All questions were answered, patient is agreeable to proceed.  Consent signed and in IR  Thank you for allowing our service to participate in James Albright Sr. 's care.  Electronically Signed: Gwynneth Macleod, PA-C   01/05/2023, 2:13 PM      I spent a total of 40 Minutes  in face to face in clinical consultation, greater than 50% of which was counseling/coordinating care for Gtube placement.

## 2023-01-05 NOTE — TOC Initial Note (Addendum)
Transition of Care (TOC) - Initial/Assessment Note    Patient Details  Name: James Mohn Sr. MRN: [id number] Date of Birth: 12-May-1931  Transition of Care [facility]) CM/SW Contact:    Eduard Roux, LCSW Phone Number: 01/05/2023, 10:43 AM  Clinical Narrative:                  CSW spoke with patient's daughter by phone. CSW introduced self and explained role. CSW discussed with family therapy recommendations for short term rehab at [facility].  She states she understands recommendations and is agreeable to rehab at [facility]. CSW explained the SNF process. All questions answered.   TOC will provide bed offers once available.  TOC will continue to follow and assist with discharge planning.  Antony Blackbird, MSW, LCSW Clinical Social Worker    Antony Blackbird, MSW, LCSW Clinical Social Worker     Expected Discharge Plan: Skilled Nursing Facility Barriers to Discharge: Continued Medical Work up   Patient Goals and CMS Choice            Expected Discharge Plan and Services In-house Referral: Clinical Social Work                                            Prior Living Arrangements/Services   Lives with:: Self, Spouse Patient language and need for interpreter reviewed:: Yes        Need for Family Participation in Patient Care: Yes (Comment) Care giver support system in place?: Yes (comment)   Criminal Activity/Legal Involvement Pertinent to Current Situation/Hospitalization: No - Comment as needed  Activities of Daily Living Home Assistive Devices/Equipment: None ADL Screening (condition at time of admission) Patient's cognitive ability adequate to safely complete daily activities?: No Is the patient deaf or have difficulty hearing?: No Does the patient have difficulty seeing, even when wearing glasses/contacts?: No Does the patient have difficulty concentrating, remembering, or making decisions?: No Patient able to express need for assistance with ADLs?:  No Does the patient have difficulty dressing or bathing?: No Independently performs ADLs?: No Communication: Needs assistance Is this a change from baseline?: Change from baseline, expected to last >3 days Dressing (OT): Needs assistance Is this a change from baseline?: Change from baseline, expected to last >3 days Grooming: Needs assistance Is this a change from baseline?: Change from baseline, expected to last >3 days Feeding: Dependent Is this a change from baseline?: Change from baseline, expected to last >3 days Bathing: Dependent Is this a change from baseline?: Change from baseline, expected to last >3 days Toileting: Dependent Is this a change from baseline?: Change from baseline, expected to last >3days In/Out Bed: Needs assistance Is this a change from baseline?: Change from baseline, expected to last >3 days Walks in Home: Needs assistance Does the patient have difficulty walking or climbing stairs?: No Weakness of Legs: Both Weakness of Arms/Hands: Both  Permission Sought/Granted   Permission granted to share information with :  (patient ony alert to self- daugter gave permission to seek SNF placement)     Permission granted to share info w AGENCY: SNFs        Emotional Assessment       Orientation: : Oriented to Self Alcohol / Substance Use: Not Applicable Psych Involvement: No (comment)  Admission diagnosis:  Subarachnoid hemorrhage (HCC) [I60.9] Fall, initial encounter [W19.XXXA] Patient Active Problem List   Diagnosis Date Noted  Protein-calorie malnutrition, severe 12/19/2022   Urinary retention 12/19/2022   Frequent falls 12/17/2022   Acute metabolic encephalopathy 12/17/2022   Chronic HFrEF with improved EF(heart failure with reduced ejection fraction) (HCC) 12/17/2022   At high risk for bleeding 12/17/2022   Ischemic cardiomyopathy 12/17/2022   SIRS (systemic inflammatory response syndrome) (HCC) 12/17/2022   Hypokalemia 12/17/2022    Subarachnoid hemorrhage (HCC) 12/15/2022   History of recent fall 12/14/2022   Encounter for prostate cancer screening 07/20/2022   Acute cystitis without hematuria 07/20/2022   Urinary frequency 07/20/2022   Hoarse voice quality 11/14/2020   CAD S/P LAD stent 2022 07/07/2020   Cancer (HCC)    PAF (paroxysmal atrial fibrillation) (HCC) 06/25/2020   NSTEMI (non-ST elevated myocardial infarction) (HCC) 06/21/2020   History of prostate cancer    Other fatigue 02/05/2020   Anemia 02/05/2020   Moderate persistent asthma, uncomplicated 01/21/2020   Asthma-COPD overlap syndrome 01/21/2020   Acquired hypothyroidism 08/15/2019   Mixed conductive and sensorineural hearing loss, 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   Malnutrition of moderate degree (HCC) 08/15/2019   Senile osteoporosis 08/15/2019   Nonsustained ventricular tachycardia (HCC) 01/30/2019   Frequent PVCs 11/28/2018   Essential hypertension 11/28/2018   LVH (left ventricular hypertrophy) 11/28/2018   Hyperlipidemia, mixed 08/21/2008   SINUSITIS, ACUTE 06/28/2007   Seasonal and perennial allergic rhinitis 05/17/2007   PCP:  Blane Ohara, MD Pharmacy:   [facility] [phone] - [place], [place] - [address] AT [facility] [address] [place] [address] Phone: [phone] Fax: [phone]  [facility] - [place], [place] - [address] [facility] [address] [place] [address] Phone: [phone] Fax: [phone]     Social Determinants of Health (SDOH) Social History: SDOH Screenings   Food Insecurity: No Food Insecurity (12/16/2022)  Housing: Low Risk  (12/16/2022)  Transportation Needs: No Transportation Needs (12/16/2022)  Utilities: Not At Risk (12/16/2022)  Alcohol Screen: Low Risk  (07/08/2022)  Depression (PHQ2-9): Low Risk  (07/26/2022)  Financial Resource Strain: Low Risk  (03/30/2022)  Physical Activity: Insufficiently Active (01/29/2021)  Social Connections: Socially  Integrated (01/29/2021)  Stress: No Stress Concern Present (01/29/2021)  Tobacco Use: Medium Risk (12/16/2022)   SDOH Interventions:     Readmission Risk Interventions     No data to display

## 2023-01-05 NOTE — Progress Notes (Signed)
[facility] PROGRESS NOTE  James Payne Sr. (DOB: Aug 29, 1931) WUJ:[id number]   PCP: Blane Ohara, MD    Brief Narrative: James Laperriere Sr. is an 89 y.o. male with a history of A-fib, NSVT, ischemic cardiomyopathy (NSTEMI s/p LAD stent 2022), HFrEF (LVEF improved to 55% 2022 from 35%), HTN, COPD, hypothyroidism, chronic anemia, frequent falls who presented 8/8 after a fall found to have a traumatic subarachnoid hemorrhage.   Hospitalization has been complicated by E. coli UTI, asymptomatic runs of AFib and PVCs for which cardiology was consulted, acute urinary retention for which urology was consulted, placed urinary catheter 8/12, dysphagia for which cortrak tube has been placed, and persistent encephalopathy. Palliative care is following.  SLP following.  Patient making little to no progress-- awake and alert at times but not consistently.  Daughter still deciding on PEG tube-- have asked her to have a decision by Wednesday as cortrak has been in for >2 weeks  Subjective: Noted to be awake and alert today.  Does not answer any questions.  Does not appear to be in any discomfort or distress.  Objective: BP 126/78 (BP Location: Right Arm)   Pulse 80   Temp 99.8 F (37.7 C) (Axillary)   Resp 19   Ht 6' (1.829 m)   Wt 54.1 kg   SpO2 96%   BMI 16.18 kg/m    General appearance: Awake alert.  In no distress.  Distracted Resp: Coarse breath sounds bilaterally.  No wheezing or rhonchi. Cardio: S1-S2 is normal regular.  No S3-S4.  No rubs murmurs or bruit GI: Abdomen is soft.  Nontender nondistended.  Bowel sounds are present normal.  No masses organomegaly   Assessment & Plan: Traumatic subarachnoid hemorrhage/subdural hematoma Patient was seen by neurosurgery.   CT 8/13 mentions vasogenic edema, and expected redistribution of contusions.  Repeat CT scan 8/26 shows improvement in size of SDH. Neurologically he seems to be stable though is still  encephalopathic.  Fever, persistent leukocytosis Isolated fever on 8/19. No s/sxs or VTE and has been getting prophylaxis w/heparin. Chest x ray normal. UA without pyuria. No cellulitis by exam. No meningismus.  Seems to have resolved.  Continue to monitor. He remains afebrile.  WBC remains elevated.  Could be reactive.  Recently completed 7-day course of ceftriaxone.  Will give him IV fluids and recheck labs tomorrow.  No clear indication to resume antibiotics.  TBI with acute encephalopathy:  - Optimize medical and nutritional status - He did not have significant delirium during hospitalization in 2022, so this is likely related to TBI/hemorrhage.  - Palliative following  Severe protein calorie malnutrition/dysphagia Body mass index is 16.18 kg/m.  - Feeding thru cortrak. Keep NPO per SLP evaluation.  Daughter agreeable to PEG tube.  IR has been consulted.   E. coli UTI Resistant to ancef. Received ceftriaxone x7 days     Acute urinary retention, BPH, prostate CA s/p brachytherapy: Urology was consulted, placed catheter 8/12 over a wire. Will continue catheter until more mobile.   PAF w/RVR, frequent PVCs, hx NSVT Currently in sinus rhythm with PACs, PVCs. Continue metoprolol. - Holding full dose anticoagulation due to fall risk and current SAH, SDH. Not a good candidate for Watchman device.   CAD s/p LAD stent 2022 Elevated troponin is mild at 32, flat, without angina consistent with demand myocardial ischemia.  - Continue beta blocker, statin. Not on antiplatelet due to bleeding.   Chronic HFrEF with improved EF (35>55%, 2022): - Continue metoprolol.   -  Remains euvolemic, continue osmolite 1.2 for full nutritional support, continue free water q6h.   Hypokalemia Resolved  Hyponatremia:  Stable.  Continue to monitor.   Hypophosphatemia:  Resolved with supplementation.   Chronic normocytic anemia:  No bleeding, urine without gross hematuria. Deferred GI work up  PTA due to frailty.  - Monitor intermittently, remains stable.   Moderate persistent asthma, uncomplicated No exacerbation. - Continue DuoNebs.   Acquired hypothyroidism TSH is 6.177, free T4 is normal.  - Continue levothyroxine 175 mcg daily   Essential hypertension - Continue metoprolol   Debility, deconditioning, frailty At current level, would require SNF level of care. Continue mobilizing as able.    Goals of care Monitoring for response  Constipation -bowel regimen PRN  DVT prophylaxis: Subcutaneous heparin CODE STATUS: Full code Family communication: Daughter being updated daily. Disposition: SNF when medically improved    Osvaldo Shipper,  [facility] [email or web address] 01/05/2023, 9:41 AM

## 2023-01-05 NOTE — Progress Notes (Addendum)
  Cortrak clogged as RN attempt to flush due to error message of "clogged" on screen. [place] team unavailable. TF stopped. MD notified. Cortrak tube remains clogged with several attempted to declog it. Per IR, pt possibly getting PEG placement today.
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
## 2023-01-05 NOTE — NC FL2 (Signed)
[place] MEDICAID FL2 LEVEL OF CARE FORM     IDENTIFICATION  Patient Name: James Wilkinson Sr. Birthdate: 1932-02-28 Sex: male Admission Date (Current Location): 12/15/2022  [facility] and [place] Number:  [employer] and Address:  [facility], [address] [place], [place] [address]      Provider Number: [id number]  Attending Physician Name and Address:  Osvaldo Shipper, MD  Relative Name and Phone Number:       Current Level of Care: Hospital Recommended Level of Care: Skilled Nursing Facility Prior Approval Number:    Date Approved/Denied:   PASRR Number: [id number]  Discharge Plan: SNF    Current Diagnoses: Patient Active Problem List   Diagnosis Date Noted   Protein-calorie malnutrition, severe 12/19/2022   Urinary retention 12/19/2022   Frequent falls 12/17/2022   Acute metabolic encephalopathy 12/17/2022   Chronic HFrEF with improved EF(heart failure with reduced ejection fraction) (HCC) 12/17/2022   At high risk for bleeding 12/17/2022   Ischemic cardiomyopathy 12/17/2022   SIRS (systemic inflammatory response syndrome) (HCC) 12/17/2022   Hypokalemia 12/17/2022   Subarachnoid hemorrhage (HCC) 12/15/2022   History of recent fall 12/14/2022   Encounter for prostate cancer screening 07/20/2022   Acute cystitis without hematuria 07/20/2022   Urinary frequency 07/20/2022   Hoarse voice quality 11/14/2020   CAD S/P LAD stent 2022 07/07/2020   Cancer (HCC)    PAF (paroxysmal atrial fibrillation) (HCC) 06/25/2020   NSTEMI (non-ST elevated myocardial infarction) (HCC) 06/21/2020   History of prostate cancer    Other fatigue 02/05/2020   Anemia 02/05/2020   Moderate persistent asthma, uncomplicated 01/21/2020   Asthma-COPD overlap syndrome 01/21/2020   Acquired hypothyroidism 08/15/2019   Mixed conductive and sensorineural hearing loss, 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   Malnutrition of moderate degree (HCC) 08/15/2019   Senile  osteoporosis 08/15/2019   Nonsustained ventricular tachycardia (HCC) 01/30/2019   Frequent PVCs 11/28/2018   Essential hypertension 11/28/2018   LVH (left ventricular hypertrophy) 11/28/2018   Hyperlipidemia, mixed 08/21/2008   SINUSITIS, ACUTE 06/28/2007   Seasonal and perennial allergic rhinitis 05/17/2007    Orientation RESPIRATION BLADDER Height & Weight     Self  Normal External catheter, Incontinent Weight: 119 lb 4.3 oz (54.1 kg) Height:  6' (182.9 cm)  BEHAVIORAL SYMPTOMS/MOOD NEUROLOGICAL BOWEL NUTRITION STATUS      Incontinent Diet, Feeding tube (see discharge summary)  AMBULATORY STATUS COMMUNICATION OF NEEDS Skin   Extensive Assist Verbally Skin abrasions (laceration Rt Eye, closed w/ liquid skin adhesive)                       Personal Care Assistance Level of Assistance  Bathing, Feeding, Dressing Bathing Assistance: Limited assistance Feeding assistance: Limited assistance Dressing Assistance: Limited assistance     Functional Limitations Info  Sight, Hearing, Speech Sight Info: Adequate Hearing Info: Adequate Speech Info: Adequate    SPECIAL CARE FACTORS FREQUENCY  OT (By licensed OT), PT (By licensed PT)     PT Frequency: 5x per week OT Frequency: 5x per week            Contractures Contractures Info: Not present    Additional Factors Info  Code Status, Allergies Code Status Info: FULL Allergies Info: Clarithromycin           Current Medications (01/05/2023):  This is the current hospital active medication list Current Facility-Administered Medications  Medication Dose Route Frequency Provider Last Rate Last Admin   0.45 % sodium chloride infusion  Intravenous Continuous Osvaldo Shipper, MD 100 mL/hr at 01/05/23 1006 New Bag at 01/05/23 1006   acetaminophen (TYLENOL) tablet 650 mg  650 mg Per Tube Q4H PRN Pokhrel, Rebekah [place], MD   650 mg at 01/03/23 4782   Or   acetaminophen (TYLENOL) suppository 650 mg  650 mg Rectal Q4H PRN Pokhrel,  Laxman, MD       albuterol (PROVENTIL) (2.5 MG/3ML) 0.083% nebulizer solution 2.5 mg  2.5 mg Nebulization Q6H PRN Francena Hanly, RPH       bisacodyl (DULCOLAX) suppository 10 mg  10 mg Rectal Daily PRN Marlin Canary U, DO   10 mg at 12/30/22 1710   Chlorhexidine Gluconate Cloth 2 % PADS 6 each  6 each Topical Daily Pokhrel, Laxman, MD   6 each at 01/05/23 0849   docusate (COLACE) 50 MG/5ML liquid 100 mg  100 mg Per Tube BID Pokhrel, Laxman, MD   100 mg at 01/04/23 2206   famotidine (PEPCID) 40 MG/5ML suspension 40 mg  40 mg Oral Daily Pokhrel, Laxman, MD   40 mg at 01/04/23 1118   feeding supplement (OSMOLITE 1.2 CAL) liquid 1,000 mL  1,000 mL Per Tube Continuous Pokhrel, Laxman, MD   Stopped at 01/05/23 0745   feeding supplement (PROSource TF20) liquid 60 mL  60 mL Per Tube Daily Marlin Canary U, DO   60 mL at 01/04/23 1118   free water 100 mL  100 mL Per Tube Q12H Vann, Jessica U, DO   100 mL at 01/04/23 2200   heparin injection 5,000 Units  5,000 Units Subcutaneous Q12H Patrici Ranks [place], PA-C   5,000 Units at 01/05/23 1013   levothyroxine (SYNTHROID) tablet 175 mcg  175 mcg Per Tube [id number] Joycelyn Das, MD   175 mcg at 01/04/23 0524   lipase/protease/amylase) Elvina Sidle) tablets 20,880 Units  20,880 Units Per Tube Once Osvaldo Shipper, MD       And   sodium bicarbonate tablet 650 mg  650 mg Per Tube Once Osvaldo Shipper, MD       loratadine (CLARITIN) tablet 10 mg  10 mg Per Tube Daily Pokhrel, Laxman, MD   10 mg at 01/04/23 1117   metoprolol tartrate (LOPRESSOR) injection 5 mg  5 mg Intravenous Daily PRN Pokhrel, Laxman, MD   5 mg at 12/19/22 0921   metoprolol tartrate (LOPRESSOR) tablet 25 mg  25 mg Per Tube BID Marlin Canary U, DO   25 mg at 01/04/23 2206   ondansetron (ZOFRAN) tablet 4 mg  4 mg Per Tube Q4H PRN Pokhrel, Laxman, MD   4 mg at 12/22/22 1016   Or   ondansetron (ZOFRAN) injection 4 mg  4 mg Intravenous Q4H PRN Pokhrel, Laxman, MD       Oral care mouth rinse  15 mL  Mouth Rinse 4 times per day Pokhrel, Laxman, MD   15 mL at 01/05/23 0800   Oral care mouth rinse  15 mL Mouth Rinse PRN Pokhrel, Laxman, MD       polyethylene glycol (MIRALAX / GLYCOLAX) packet 17 g  17 g Per Tube Daily PRN Pokhrel, Laxman, MD   17 g at 12/31/22 2151   senna (SENOKOT) tablet 8.6 mg  1 tablet Oral Daily PRN Marlin Canary U, DO   8.6 mg at 12/31/22 2153   terbinafine (LAMISIL) tablet 250 mg  250 mg Per Tube Daily Pokhrel, Laxman, MD   250 mg at 01/04/23 1118   white petrolatum (VASELINE) gel   Topical PRN Joycelyn Das, MD  Given at 12/22/22 1017     Discharge Medications: Please see discharge summary for a list of discharge medications.  Relevant Imaging Results:  Relevant Lab Results:   Additional Information SSN [id number]  Eduard Roux, LCSW

## 2023-01-06 ENCOUNTER — Inpatient Hospital Stay (HOSPITAL_COMMUNITY): Payer: Medicare HMO

## 2023-01-06 DIAGNOSIS — G9341 Metabolic encephalopathy: Secondary | ICD-10-CM | POA: Diagnosis not present

## 2023-01-06 DIAGNOSIS — I609 Nontraumatic subarachnoid hemorrhage, unspecified: Secondary | ICD-10-CM | POA: Diagnosis not present

## 2023-01-06 DIAGNOSIS — I480 Paroxysmal atrial fibrillation: Secondary | ICD-10-CM | POA: Diagnosis not present

## 2023-01-06 DIAGNOSIS — D72829 Elevated white blood cell count, unspecified: Secondary | ICD-10-CM | POA: Diagnosis not present

## 2023-01-06 LAB — CBC WITH DIFFERENTIAL/PLATELET
Abs Immature Granulocytes: 0.48 10*3/uL — ABNORMAL HIGH (ref 0.00–0.07)
Basophils Absolute: 0.1 10*3/uL (ref 0.0–0.1)
Basophils Relative: 1 %
Eosinophils Absolute: 0.3 10*3/uL (ref 0.0–0.5)
Eosinophils Relative: 2 %
HCT: 34.7 % — ABNORMAL LOW (ref 39.0–52.0)
Hemoglobin: 11.2 g/dL — ABNORMAL LOW (ref 13.0–17.0)
Immature Granulocytes: 4 %
Lymphocytes Relative: 14 %
Lymphs Abs: 1.7 10*3/uL (ref 0.7–4.0)
MCH: 30.8 pg (ref 26.0–34.0)
MCHC: 32.3 g/dL (ref 30.0–36.0)
MCV: 95.3 fL (ref 80.0–100.0)
Monocytes Absolute: 1.1 10*3/uL — ABNORMAL HIGH (ref 0.1–1.0)
Monocytes Relative: 10 %
Neutro Abs: 8 10*3/uL — ABNORMAL HIGH (ref 1.7–7.7)
Neutrophils Relative %: 69 %
Platelets: 407 10*3/uL — ABNORMAL HIGH (ref 150–400)
RBC: 3.64 MIL/uL — ABNORMAL LOW (ref 4.22–5.81)
RDW: 12.7 % (ref 11.5–15.5)
WBC: 11.6 10*3/uL — ABNORMAL HIGH (ref 4.0–10.5)
nRBC: 0 % (ref 0.0–0.2)

## 2023-01-06 LAB — GLUCOSE, CAPILLARY
Glucose-Capillary: 111 mg/dL — ABNORMAL HIGH (ref 70–99)
Glucose-Capillary: 118 mg/dL — ABNORMAL HIGH (ref 70–99)
Glucose-Capillary: 152 mg/dL — ABNORMAL HIGH (ref 70–99)
Glucose-Capillary: 154 mg/dL — ABNORMAL HIGH (ref 70–99)
Glucose-Capillary: 87 mg/dL (ref 70–99)
Glucose-Capillary: 89 mg/dL (ref 70–99)
Glucose-Capillary: 94 mg/dL (ref 70–99)

## 2023-01-06 NOTE — Progress Notes (Signed)
Speech Language Pathology Treatment: Cognitive-Linquistic  Patient Details Name: James Stendahl Sr. MRN: [id number] DOB: Feb 24, 1932 Today's Date: 01/06/2023 Time: 6578-4696 SLP Time Calculation (min) (ACUTE ONLY): 19 min  Assessment / Plan / Recommendation Clinical Impression  Pt seen for skilled treatment targeting cognitive goals. Pt resting talking aloud upon arrival. SLP removed safety mitt and attempted to cue pt to initiate self-care tasks such as washing his face and performing oral care. Pt became agitated and tossed wash cloth from his hand, refusing to wash his face and moving away from attempts. Pt unsuccessful with oral care given Max support. Safety mitt reapplied and pt then compliant with SLP performing thorough oral care, which is improved from previous sessions. Pt 100% accurate with biographical yes/no questions, although was unsuccessful with environmental questions ("are the lights on in this room?", "is the door closed?", etc). Pt unable to name objects present in room given a model. He followed ~10% of one-step commands given Max cueing and a model, although benefits from increased wait time. Overall, feel that he has shown minimal progress over the course of this admission with cognitive goals, although he has moments of alertness with increased performance that fluctuate with moments of lethargy. Will continue to follow for cognition and swallowing, although feel that discussions with palliative care may be beneficial as family feels ready as pt remains completely dependent for all aspects of care.    HPI HPI: Pt is an 89 y.o. male who presented with AMS after a fall. CT head: Scattered areas of subarachnoid hemorrhage in the left occipital  lobe, bilateral high frontal lobes, and right temporal lobe. Small amount of subarachnoid hemorrhage posterior to the right midbrain extending into the cerebral aqueduct. CTH 8/26 with decreased size and density of previously seen multifocal  SAH. PMH: COPD, prior NSTEMI / CHF, NSVT, PAF, on ASA81.      SLP Plan  Continue with current plan of care      Recommendations for follow up therapy are one component of a multi-disciplinary discharge planning process, led by the attending physician.  Recommendations may be updated based on patient status, additional functional criteria and insurance authorization.    Recommendations  Diet recommendations: NPO                  Oral care QID;Staff/trained caregiver to provide oral care;Oral care prior to ice chip/H20   Frequent or constant Supervision/Assistance Dysphagia, oropharyngeal phase (R13.12);Cognitive communication deficit (E95.284)     Continue with current plan of care     Gwynneth Aliment, M.A., CF-SLP Speech Language Pathology, Acute Rehabilitation Services  Secure Chat preferred [phone]  01/06/2023, 10:30 AM

## 2023-01-06 NOTE — Progress Notes (Signed)
[facility] PROGRESS NOTE  [name] (DOB: [birth year]) ZOX:[id number]   PCP: Blane Ohara, MD    Brief Narrative: James Graeser Sr. is an 89 y.o. male with a history of A-fib, NSVT, ischemic cardiomyopathy (NSTEMI s/p LAD stent 2022), HFrEF (LVEF improved to 55% 2022 from 35%), HTN, COPD, hypothyroidism, chronic anemia, frequent falls who presented 8/8 after a fall found to have a traumatic subarachnoid hemorrhage.   Hospitalization has been complicated by E. coli UTI, asymptomatic runs of AFib and PVCs for which cardiology was consulted, acute urinary retention for which urology was consulted, placed urinary catheter 8/12, dysphagia for which cortrak tube has been placed, and persistent encephalopathy.   Subjective: Patient mentation has been better in the last 48 hours.  Still not responding appropriately to questions.  Does not appear to be in any distress.  Still requiring mittens.    Objective: BP 115/63   Pulse 81   Temp 97.7 F (36.5 C) (Oral)   Resp (!) 21   Ht 6' (1.829 m)   Wt 55.8 kg   SpO2 95%   BMI 16.68 kg/m    General appearance: Distracted.  Awake. Resp: Clear to auscultation bilaterally.  Normal effort Cardio: S1-S2 is normal regular.  No S3-S4.  No rubs murmurs or bruit GI: Abdomen is soft.  Nontender nondistended.  Bowel sounds are present normal.  No masses organomegaly    Assessment & Plan: Traumatic subarachnoid hemorrhage/subdural hematoma Patient was seen by neurosurgery.   CT 8/13 mentions vasogenic edema, and expected redistribution of contusions.  Repeat CT scan 8/26 shows improvement in size of SDH. Neurologically he seems to be stable though is still encephalopathic.  Fever, persistent leukocytosis Isolated fever on 8/19. No s/sxs or VTE and has been getting prophylaxis w/heparin. Chest x ray normal. UA without pyuria. No cellulitis by exam. No meningismus.  He remains afebrile.  Persistent leukocytosis was noted.  This is  likely reactive.  Given IV fluids with improvement in counts today. Recently completed 7-day course of ceftriaxone. No clear indication to resume antibiotics.  TBI with acute encephalopathy:  - Optimize medical and nutritional status - He did not have significant delirium during hospitalization in 2022, so this is likely related to TBI/hemorrhage.   Severe protein calorie malnutrition/dysphagia Body mass index is 16.68 kg/m.  - Feeding thru cortrak. Keep NPO per SLP evaluation.  Daughter agreeable to PEG tube.  IR has been consulted.  PEG tube placement today.   E. coli UTI Resistant to ancef. Received ceftriaxone x7 days     Acute urinary retention, BPH, prostate CA s/p brachytherapy: Urology was consulted, placed catheter 8/12 over a wire. Will continue catheter until more mobile.  To go to SNF with catheter.  Will need to be seen by urology in the outpatient setting for voiding trial.   PAF w/RVR, frequent PVCs, hx NSVT Currently in sinus rhythm with PACs, PVCs. Continue metoprolol. Holding full dose anticoagulation due to fall risk and current SAH, SDH. Not a good candidate for Watchman device.   CAD s/p LAD stent 2022 Elevated troponin is mild at 32, flat, without angina consistent with demand myocardial ischemia.  Continue beta blocker, statin. Not on antiplatelet due to bleeding.   Chronic HFrEF with improved EF (35>55%, 2022): - Continue metoprolol.   - Remains euvolemic, continue osmolite 1.2 for full nutritional support, continue free water q6h.   Hypokalemia Resolved  Hyponatremia:  Stable.  Continue to monitor.   Hypophosphatemia:  Resolved with supplementation.  Chronic normocytic anemia:  No bleeding, urine without gross hematuria. Deferred GI work up PTA due to frailty.  - Monitor intermittently, remains stable.   Moderate persistent asthma, uncomplicated No exacerbation. - Continue DuoNebs.   Acquired hypothyroidism TSH is 6.177, free T4 is  normal.  - Continue levothyroxine 175 mcg daily   Essential hypertension - Continue metoprolol   Debility, deconditioning, frailty At current level, would require SNF level of care. Continue mobilizing as able.    Goals of care Monitoring for response  Constipation -bowel regimen PRN  DVT prophylaxis: Subcutaneous heparin CODE STATUS: Full code Family communication: Daughter being updated daily. Disposition: SNF when medically improved    Osvaldo Shipper,  [facility] [email or web address] 01/06/2023, 9:50 AM

## 2023-01-06 NOTE — Plan of Care (Signed)

## 2023-01-06 NOTE — Procedures (Signed)
Cortrak  Person Inserting Tube:  Maylon Peppers C, RD Tube Type:  Cortrak - 43 inches Tube Size:  10 Tube Location:  Left nare Secured by: Bridle Technique Used to Measure Tube Placement:  Marking at nare/corner of mouth Cortrak Secured At:  70 cm   Cortrak Tube Team Note:  Consult received to place a Cortrak feeding tube.   X-ray is required, abdominal x-ray has been ordered by the Cortrak team. Please confirm tube placement before using the Cortrak tube.   If the tube becomes dislodged please keep the tube and contact the Cortrak team at [email or web address] for replacement.  If after hours and replacement cannot be delayed, place a NG tube and confirm placement with an abdominal x-ray.   Lockie Pares., RD, LDN, CNSC See AMiON for contact information

## 2023-01-06 NOTE — Progress Notes (Signed)
IR Procedure Request - G tube placement  89 y.o. male inpatient. History of A fib, CAD, NSTEMI, s/p PCI, CHF, HTN. COPD. Admitted for injuries related to fall. Found to have a traumatic subarachnoid hemorrhage. Team is request a G tube for nutritional access. Per Team patient lost Coretrak access today. Verbal instructions were given to obtain Coretrak. Unfortunately the tube feeds were restarted. Due to the Patient not being NPO IR will be unable to perform this procedure today as originally scheduled. Patient tentatively scheduled for 9.3.24. Team instructed to:  Team instructed to: Keep Patient to be NPO after midnight Hold prophylactic anticoagulation 24 hours prior to scheduled procedure.   IR will call patient when ready.

## 2023-01-06 NOTE — Progress Notes (Signed)
Physical Therapy Treatment Patient Details Name: James Setzer Sr. MRN: [id number] DOB: 05/12/1931 Today's Date: 01/06/2023   History of Present Illness Pt is an 89 y.o. male who presented 12/15/22 s/p fall with head trauma. CTH shows diffuse scattered traumatic pattern subarachnoid hemorrhage. Cortrak placed 8/12. PMHx includes COPD, prior NSTEMI / CHF, NSVT, PAF, anemia, bradycardia, senile osteoporosis, CAD, HLD, HTN    PT Comments  Pt was much more agreeable to session and pleasant with no attempts to be combative or aggressive today. He remains confused, only following ~50% of simple cues though. In addition, he gets anxious with standing mobility, demonstrating a tendency to push himself posteriorly and lift one leg off the ground while the other one slides anteriorly. He was able to pull up to sit with minA but needed maxA to transfer to stand 4x and total assist to stand pivot bed > chair today. Will continue to follow acutely. Updated PT goals.     If plan is discharge home, recommend the following: Two people to help with walking and/or transfers;Two people to help with bathing/dressing/bathroom;Direct supervision/assist for financial management;Direct supervision/assist for medications management;Assistance with feeding;Help with stairs or ramp for entrance;Assist for transportation;Assistance with cooking/housework   Can travel by private vehicle     No  Equipment Recommendations  Other (comment) (TBD)    Recommendations for Other Services       Precautions / Restrictions Precautions Precautions: Fall;Other (comment) Precaution Comments: cortrak; bil mitts; combative intermittently; watch HR Restrictions Weight Bearing Restrictions: No     Mobility  Bed Mobility Overal bed mobility: Needs Assistance Bed Mobility: Supine to Sit     Supine to sit: Min assist, HOB elevated     General bed mobility comments: Stated "come on, let's get up, we got things to do" and pt  pulled up on therapist's hands to sit up long sitting in bed with minA. Extra time and coaxing to get pt to scoot to EOB with feet on ground.    Transfers Overall transfer level: Needs assistance Equipment used: 1 person hand held assist, Rolling walker (2 wheels) Transfers: Sit to/from Stand, Bed to chair/wheelchair/BSC Sit to Stand: +2 safety/equipment, Max assist Stand pivot transfers: Total assist, +2 safety/equipment         General transfer comment: Pt needing >5 min to even attempt to stand often, repeatedly stating "give me a minute". Guided pt's hands to hold onto therapist anterior to him to pull to stand the first rep from EOB, maxA, then pivot to L to chair with total assist. Knees blocked and noted pt would lift one leg off the ground and lean posteriorly standing on a single leg. Perfromed x3 additional transfers to stand to RW in hallway with windows to try to facilitate him to initiate coming to stand to look out windows, but pt still needing maxA due to retropulsion.    Ambulation/Gait               General Gait Details: unable at this time   Stairs             Wheelchair Mobility     Tilt Bed    Modified Rankin (Stroke Patients Only)       Balance Overall balance assessment: Needs assistance Sitting-balance support: Feet supported, Bilateral upper extremity supported, No upper extremity supported Sitting balance-Leahy Scale: Fair Sitting balance - Comments: Sits statically EOB with CGA-SUP, with or without UE support. Pt rocks his trunk impulsively and needs CGA for safety at  times. Postural control: Posterior lean Standing balance support: Bilateral upper extremity supported Standing balance-Leahy Scale: Poor Standing balance comment: MaxA and knees blocked to stand with a flexed posture and noted retropulsion. Pt impulsive to lift one leg off the ground when standing.                            Cognition Arousal: Alert Behavior  During Therapy: Anxious Overall Cognitive Status: Impaired/Different from baseline Area of Impairment: Attention, Memory, Following commands, Safety/judgement, Awareness, Problem solving               Rancho Levels of Cognitive Functioning Rancho Los Amigos Scales of Cognitive Functioning: Confused, Inappropriate Non-Agitated: Maximal Assistance   Current Attention Level: Focused Memory: Decreased recall of precautions, Decreased short-term memory Following Commands: Follows one step commands inconsistently, Follows one step commands with increased time Safety/Judgement: Decreased awareness of safety, Decreased awareness of deficits Awareness: Intellectual Problem Solving: Slow processing, Decreased initiation, Difficulty sequencing, Requires verbal cues, Requires tactile cues General Comments: Pt pleasant and not agitated or combative today. Appears anxious in regards to standing mobility, often stating "just give me a minute" when trying to cue him to stand even though pt given >5 min at a time to rest. Follows simple multi-modal cues ~50% of the time.   Rancho Mirant Scales of Cognitive Functioning: Confused, Inappropriate Non-Agitated: Maximal Assistance [V]    Exercises      General Comments General comments (skin integrity, edema, etc.): HR up to 150s      Pertinent Vitals/Pain Pain Assessment Pain Assessment: Faces Faces Pain Scale: Hurts little more Pain Location: generalized with movement Pain Descriptors / Indicators: Discomfort, Grimacing Pain Intervention(s): Limited activity within patient's tolerance, Monitored during session, Repositioned    Home Living                          Prior Function            PT Goals (current goals can now be found in the care plan section) Acute Rehab PT Goals Patient Stated Goal: family hopes for pt to improve PT Goal Formulation: With patient/family Time For Goal Achievement: 01/20/23 Potential to Achieve  Goals: Fair Progress towards PT goals: Progressing toward goals;Goals downgraded-see care plan    Frequency    Min 1X/week      PT Plan      Co-evaluation              AM-PAC PT "6 Clicks" Mobility   Outcome Measure  Help needed turning from your back to your side while in a flat bed without using bedrails?: A Little Help needed moving from lying on your back to sitting on the side of a flat bed without using bedrails?: A Little Help needed moving to and from a bed to a chair (including a wheelchair)?: Total Help needed standing up from a chair using your arms (e.g., wheelchair or bedside chair)?: A Lot Help needed to walk in hospital room?: Total Help needed climbing 3-5 steps with a railing? : Total 6 Click Score: 11    End of Session Equipment Utilized During Treatment: Gait belt Activity Tolerance: Patient tolerated treatment well Patient left: with call bell/phone within reach;with restraints reapplied;in chair;with chair alarm set;with family/visitor present Nurse Communication: Mobility status (NT) PT Visit Diagnosis: Other abnormalities of gait and mobility (R26.89);Muscle weakness (generalized) (M62.81);Other symptoms and signs involving the nervous system (R29.898);Pain;Unsteadiness on  feet (R26.81);Difficulty in walking, not elsewhere classified (R26.2)     Time: 1610-9604 PT Time Calculation (min) (ACUTE ONLY): 49 min  Charges:    $Therapeutic Activity: 38-52 mins PT General Charges $$ ACUTE PT VISIT: 1 Visit                     Virgil Benedict, PT, DPT Acute Rehabilitation Services  Office: [phone]    Bettina Gavia 01/06/2023, 5:50 PM

## 2023-01-07 DIAGNOSIS — I480 Paroxysmal atrial fibrillation: Secondary | ICD-10-CM | POA: Diagnosis not present

## 2023-01-07 DIAGNOSIS — D72829 Elevated white blood cell count, unspecified: Secondary | ICD-10-CM | POA: Diagnosis not present

## 2023-01-07 DIAGNOSIS — I609 Nontraumatic subarachnoid hemorrhage, unspecified: Secondary | ICD-10-CM | POA: Diagnosis not present

## 2023-01-07 DIAGNOSIS — G9341 Metabolic encephalopathy: Secondary | ICD-10-CM | POA: Diagnosis not present

## 2023-01-07 LAB — CBC
HCT: 34.9 % — ABNORMAL LOW (ref 39.0–52.0)
Hemoglobin: 11.2 g/dL — ABNORMAL LOW (ref 13.0–17.0)
MCH: 31.3 pg (ref 26.0–34.0)
MCHC: 32.1 g/dL (ref 30.0–36.0)
MCV: 97.5 fL (ref 80.0–100.0)
Platelets: 399 10*3/uL (ref 150–400)
RBC: 3.58 MIL/uL — ABNORMAL LOW (ref 4.22–5.81)
RDW: 12.7 % (ref 11.5–15.5)
WBC: 12.3 10*3/uL — ABNORMAL HIGH (ref 4.0–10.5)
nRBC: 0 % (ref 0.0–0.2)

## 2023-01-07 LAB — BASIC METABOLIC PANEL
Anion gap: 11 (ref 5–15)
BUN: 24 mg/dL — ABNORMAL HIGH (ref 8–23)
CO2: 23 mmol/L (ref 22–32)
Calcium: 8.9 mg/dL (ref 8.9–10.3)
Chloride: 100 mmol/L (ref 98–111)
Creatinine, Ser: 0.67 mg/dL (ref 0.61–1.24)
GFR, Estimated: >60 mL/min (ref >60)
Glucose, Bld: 130 mg/dL — ABNORMAL HIGH (ref 70–99)
Potassium: 4.2 mmol/L (ref 3.5–5.1)
Sodium: 134 mmol/L — ABNORMAL LOW (ref 135–145)

## 2023-01-07 LAB — GLUCOSE, CAPILLARY
Glucose-Capillary: 113 mg/dL — ABNORMAL HIGH (ref 70–99)
Glucose-Capillary: 121 mg/dL — ABNORMAL HIGH (ref 70–99)
Glucose-Capillary: 127 mg/dL — ABNORMAL HIGH (ref 70–99)
Glucose-Capillary: 133 mg/dL — ABNORMAL HIGH (ref 70–99)
Glucose-Capillary: 135 mg/dL — ABNORMAL HIGH (ref 70–99)
Glucose-Capillary: 144 mg/dL — ABNORMAL HIGH (ref 70–99)

## 2023-01-07 NOTE — Progress Notes (Signed)
[facility] PROGRESS NOTE  James Kingery Sr. (DOB: 07-11-1931) NWG:[id number]   PCP: Blane Ohara, MD    Brief Narrative: James Cucinella Sr. is an 89 y.o. male with a history of A-fib, NSVT, ischemic cardiomyopathy (NSTEMI s/p LAD stent 2022), HFrEF (LVEF improved to 55% 2022 from 35%), HTN, COPD, hypothyroidism, chronic anemia, frequent falls who presented 8/8 after a fall found to have a traumatic subarachnoid hemorrhage.   Hospitalization has been complicated by E. coli UTI, asymptomatic runs of AFib and PVCs for which cardiology was consulted, acute urinary retention for which urology was consulted, placed urinary catheter 8/12, dysphagia for which cortrak tube has been placed, and persistent encephalopathy.   Subjective: Patient remains confused and mildly agitated.     Objective: BP 118/72 (BP Location: Right Arm)   Pulse 88   Temp 98.1 F (36.7 C) (Oral)   Resp 19   Ht 6' (1.829 m)   Wt 55.8 kg   SpO2 100%   BMI 16.68 kg/m    General appearance: Remains confused.  Eyes are open.  Mumbling. Resp: Clear to auscultation bilaterally.  Normal effort Cardio: S1-S2 is normal regular.  No S3-S4.  No rubs murmurs or bruit GI: Abdomen is soft.  Nontender nondistended.  Bowel sounds are present normal.  No masses organomegaly     Assessment & Plan: Traumatic subarachnoid hemorrhage/subdural hematoma Patient was seen by neurosurgery.   CT 8/13 mentioned vasogenic edema and expected redistribution of contusions.  Repeat CT scan 8/26 shows improvement in size of SDH. Neurologically he seems to be stable though is still encephalopathic.  Fever, persistent leukocytosis Isolated fever on 8/19. No s/sxs or VTE and has been getting prophylaxis w/heparin. Chest x ray normal. UA without pyuria. No cellulitis by exam. No meningismus.  Persistent leukocytosis was noted.  This is likely reactive.  Given IV fluids with improvement in counts. Recently completed 7-day course of  ceftriaxone. No clear indication to resume antibiotics. Remains afebrile.  TBI with acute encephalopathy:  - Optimize medical and nutritional status - He did not have significant delirium during hospitalization in 2022, so this is likely related to TBI/hemorrhage.   Severe protein calorie malnutrition/dysphagia Body mass index is 16.68 kg/m.  - Feeding thru cortrak. Keep NPO per SLP evaluation.  Daughter agreeable to PEG tube.  Interventional radiology was consulted.  Plan was for PEG tube placement yesterday however due to some miscommunication patient was started on feedings yesterday instead of being left NPO.  As a result procedure had to be rescheduled to next week.   E. coli UTI Resistant to ancef. Received ceftriaxone x7 days     Acute urinary retention, BPH, prostate CA s/p brachytherapy: Urology was consulted, placed catheter 8/12 over a wire. Will continue catheter until more mobile.  To go to SNF with catheter.  Will need to be seen by urology in the outpatient setting for voiding trial.   PAF w/RVR, frequent PVCs, hx NSVT Currently in sinus rhythm with PACs, PVCs. Continue metoprolol. Holding full dose anticoagulation due to fall risk and current SAH, SDH. Not a good candidate for Watchman device.   CAD s/p LAD stent 2022 Elevated troponin is mild at 32, flat, without angina consistent with demand myocardial ischemia.  Continue beta blocker, statin. Not on antiplatelet due to bleeding.   Chronic HFrEF with improved EF (35>55%, 2022): Continue metoprolol.   Remains euvolemic, continue osmolite 1.2 for full nutritional support, continue free water q6h.   Hypokalemia Resolved  Hyponatremia:  Stable.  Continue to monitor.   Hypophosphatemia:  Resolved with supplementation.   Chronic normocytic anemia:  No bleeding, urine without gross hematuria. Deferred GI work up PTA due to frailty.  - Monitor intermittently, remains stable.   Moderate persistent asthma,  uncomplicated No exacerbation. - Continue DuoNebs.   Acquired hypothyroidism TSH is 6.177, free T4 is normal.  - Continue levothyroxine 175 mcg daily   Essential hypertension - Continue metoprolol   Debility, deconditioning, frailty At current level, would require SNF level of care. Continue mobilizing as able.    Goals of care Monitoring for response  Constipation -bowel regimen PRN  DVT prophylaxis: Subcutaneous heparin CODE STATUS: Full code Family communication: Daughter being updated daily. Disposition: SNF when medically improved    [name],  [facility] [email or web address] 01/07/2023, 9:18 AM

## 2023-01-08 DIAGNOSIS — I480 Paroxysmal atrial fibrillation: Secondary | ICD-10-CM | POA: Diagnosis not present

## 2023-01-08 DIAGNOSIS — D72829 Elevated white blood cell count, unspecified: Secondary | ICD-10-CM | POA: Diagnosis not present

## 2023-01-08 DIAGNOSIS — G9341 Metabolic encephalopathy: Secondary | ICD-10-CM | POA: Diagnosis not present

## 2023-01-08 DIAGNOSIS — I609 Nontraumatic subarachnoid hemorrhage, unspecified: Secondary | ICD-10-CM | POA: Diagnosis not present

## 2023-01-08 LAB — GLUCOSE, CAPILLARY
Glucose-Capillary: 123 mg/dL — ABNORMAL HIGH (ref 70–99)
Glucose-Capillary: 124 mg/dL — ABNORMAL HIGH (ref 70–99)
Glucose-Capillary: 128 mg/dL — ABNORMAL HIGH (ref 70–99)
Glucose-Capillary: 132 mg/dL — ABNORMAL HIGH (ref 70–99)
Glucose-Capillary: 137 mg/dL — ABNORMAL HIGH (ref 70–99)
Glucose-Capillary: 147 mg/dL — ABNORMAL HIGH (ref 70–99)

## 2023-01-08 MED ORDER — SODIUM CHLORIDE 0.9 % IV BOLUS
500.0000 mL | Freq: Once | INTRAVENOUS | Status: AC
  Administered 2023-01-08: 500 mL via INTRAVENOUS

## 2023-01-08 NOTE — Progress Notes (Signed)
[facility] PROGRESS NOTE  Donterius Ridolfi Sr. (DOB: Sep 29, 1931) UVO:[id number]   PCP: Blane Ohara, MD    Brief Narrative: Giorgi Winsted Sr. is an 89 y.o. male with a history of A-fib, NSVT, ischemic cardiomyopathy (NSTEMI s/p LAD stent 2022), HFrEF (LVEF improved to 55% 2022 from 35%), HTN, COPD, hypothyroidism, chronic anemia, frequent falls who presented 8/8 after a fall found to have a traumatic subarachnoid hemorrhage.   Hospitalization has been complicated by E. coli UTI, asymptomatic runs of AFib and PVCs for which cardiology was consulted, acute urinary retention for which urology was consulted, placed urinary catheter 8/12, dysphagia for which cortrak tube has been placed, and persistent encephalopathy.   Subjective: Patient is mumbling to himself.  Eyes are closed.  Does not answer any questions.  Objective: BP 96/77 (BP Location: Right Arm)   Pulse (!) 103   Temp 98.3 F (36.8 C) (Axillary)   Resp (!) 26   Ht 6' (1.829 m)   Wt 55.8 kg   SpO2 98%   BMI 16.68 kg/m    General appearance: Poorly responsive for the most part Resp: Clear to auscultation bilaterally.  Normal effort Cardio: S1-S2 is irregularly irregular, tachycardic GI: Abdomen is soft.  Nontender nondistended.  Bowel sounds are present normal.  No masses organomegaly    Assessment & Plan: Traumatic subarachnoid hemorrhage/subdural hematoma Patient was seen by neurosurgery.   CT 8/13 mentioned vasogenic edema and expected redistribution of contusions.  Repeat CT scan 8/26 shows improvement in size of SDH. Neurologically he seems to be stable though is still encephalopathic.  Fever, persistent leukocytosis Isolated fever on 8/19. No s/sxs or VTE and has been getting prophylaxis w/heparin. Chest x ray normal. UA without pyuria. No cellulitis by exam. No meningismus.  Persistent leukocytosis was noted.  This is likely reactive.  Given IV fluids with improvement in counts. Recently completed  7-day course of ceftriaxone. No clear indication to resume antibiotics. Remains afebrile.  Recheck labs tomorrow.  TBI with acute encephalopathy:  - Optimize medical and nutritional status - He did not have significant delirium during hospitalization in 2022, so this is likely related to TBI/hemorrhage.   Severe protein calorie malnutrition/dysphagia Body mass index is 16.68 kg/m.  - Feeding thru cortrak. Keep NPO per SLP evaluation.  Daughter agreeable to PEG tube.  Interventional radiology was consulted.  Plan was for PEG tube placement on 8/30 however due to some miscommunication patient was started on feedings instead of being left NPO.  As a result procedure had to be rescheduled to next week, tentatively planned for 9/3   E. coli UTI Resistant to ancef. Received ceftriaxone x7 days     Acute urinary retention, BPH, prostate CA s/p brachytherapy: Urology was consulted, placed catheter 8/12 over a wire. Will continue catheter until more mobile.  To go to SNF with catheter.  Will need to be seen by urology in the outpatient setting for voiding trial.   PAF w/RVR, frequent PVCs, hx NSVT Noted to be in atrial fibrillation with mild RVR. Continue metoprolol. Holding full dose anticoagulation due to fall risk and current SAH, SDH. Not a good candidate for Watchman device.   CAD s/p LAD stent 2022 Elevated troponin is mild at 32, flat, without angina consistent with demand myocardial ischemia.  Continue beta blocker, statin. Not on antiplatelet due to bleeding.   Chronic HFrEF with improved EF (35>55%, 2022): Continue metoprolol.   Remains euvolemic, continue osmolite 1.2 for full nutritional support, continue free water q6h.  Hypokalemia Resolved  Hyponatremia:  Stable.  Continue to monitor.   Hypophosphatemia:  Resolved with supplementation.   Chronic normocytic anemia:  No bleeding, urine without gross hematuria. Deferred GI work up PTA due to frailty.  - Monitor  intermittently, remains stable.   Moderate persistent asthma, uncomplicated No exacerbation. - Continue DuoNebs.   Acquired hypothyroidism TSH is 6.177, free T4 is normal.  - Continue levothyroxine 175 mcg daily   Essential hypertension - Continue metoprolol.  Soft blood pressure noted this morning.  Will continue to monitor for now and reevaluate.  Will order IV fluid bolus.   Debility, deconditioning, frailty At current level, would require SNF level of care. Continue mobilizing as able.    Goals of care Monitoring for response  Constipation -bowel regimen PRN  DVT prophylaxis: Subcutaneous heparin CODE STATUS: Full code Family communication: Daughter being updated frequently. Disposition: SNF when medically improved    Osvaldo Shipper,  [facility] [email or web address] 01/08/2023, 9:07 AM

## 2023-01-08 NOTE — Plan of Care (Signed)
  Problem: Clinical Measurements: Goal: Diagnostic test results will improve Outcome: Progressing Goal: Respiratory complications will improve Outcome: Progressing Goal: Cardiovascular complication will be avoided Outcome: Progressing   

## 2023-01-09 ENCOUNTER — Inpatient Hospital Stay (HOSPITAL_COMMUNITY): Payer: Medicare HMO

## 2023-01-09 DIAGNOSIS — D72829 Elevated white blood cell count, unspecified: Secondary | ICD-10-CM | POA: Diagnosis not present

## 2023-01-09 DIAGNOSIS — I480 Paroxysmal atrial fibrillation: Secondary | ICD-10-CM | POA: Diagnosis not present

## 2023-01-09 DIAGNOSIS — I609 Nontraumatic subarachnoid hemorrhage, unspecified: Secondary | ICD-10-CM | POA: Diagnosis not present

## 2023-01-09 DIAGNOSIS — G9341 Metabolic encephalopathy: Secondary | ICD-10-CM | POA: Diagnosis not present

## 2023-01-09 LAB — URINALYSIS, ROUTINE W REFLEX MICROSCOPIC
Bilirubin Urine: NEGATIVE
Glucose, UA: NEGATIVE mg/dL
Ketones, ur: NEGATIVE mg/dL
Nitrite: POSITIVE — AB
Protein, ur: 30 mg/dL — AB
RBC / HPF: >50 RBC/hpf (ref 0–5)
Specific Gravity, Urine: 1.021 (ref 1.005–1.030)
WBC, UA: >50 WBC/hpf (ref 0–5)
pH: 5 (ref 5.0–8.0)

## 2023-01-09 LAB — GLUCOSE, CAPILLARY
Glucose-Capillary: 111 mg/dL — ABNORMAL HIGH (ref 70–99)
Glucose-Capillary: 123 mg/dL — ABNORMAL HIGH (ref 70–99)
Glucose-Capillary: 128 mg/dL — ABNORMAL HIGH (ref 70–99)
Glucose-Capillary: 127 mg/dL — ABNORMAL HIGH (ref 70–99)
Glucose-Capillary: 119 mg/dL — ABNORMAL HIGH (ref 70–99)

## 2023-01-09 LAB — BASIC METABOLIC PANEL
Anion gap: 6 (ref 5–15)
BUN: 24 mg/dL — ABNORMAL HIGH (ref 8–23)
CO2: 24 mmol/L (ref 22–32)
Calcium: 8.5 mg/dL — ABNORMAL LOW (ref 8.9–10.3)
Chloride: 105 mmol/L (ref 98–111)
Creatinine, Ser: 0.72 mg/dL (ref 0.61–1.24)
GFR, Estimated: >60 mL/min (ref >60)
Glucose, Bld: 137 mg/dL — ABNORMAL HIGH (ref 70–99)
Potassium: 5.3 mmol/L — ABNORMAL HIGH (ref 3.5–5.1)
Sodium: 135 mmol/L (ref 135–145)

## 2023-01-09 LAB — CBC
HCT: 34.3 % — ABNORMAL LOW (ref 39.0–52.0)
Hemoglobin: 10.9 g/dL — ABNORMAL LOW (ref 13.0–17.0)
MCH: 30.9 pg (ref 26.0–34.0)
MCHC: 31.8 g/dL (ref 30.0–36.0)
MCV: 97.2 fL (ref 80.0–100.0)
Platelets: 346 10*3/uL (ref 150–400)
RBC: 3.53 MIL/uL — ABNORMAL LOW (ref 4.22–5.81)
RDW: 13.2 % (ref 11.5–15.5)
WBC: 13.2 10*3/uL — ABNORMAL HIGH (ref 4.0–10.5)
nRBC: 0 % (ref 0.0–0.2)

## 2023-01-09 MED ORDER — SODIUM CHLORIDE 0.45 % IV SOLN: INTRAVENOUS | Status: AC

## 2023-01-09 MED ORDER — SODIUM CHLORIDE 0.9 % IV SOLN
1.0000 g | INTRAVENOUS | Status: DC
  Administered 2023-01-09 – 2023-01-10 (×2): 1 g via INTRAVENOUS
  Filled 2023-01-09 (×3): qty 10

## 2023-01-09 NOTE — Progress Notes (Signed)
Physical Therapy Treatment Patient Details Name: James Hayman Sr. MRN: [id number] DOB: 1931/11/21 Today's Date: 01/09/2023   History of Present Illness Pt is an 89 y.o. male who presented 12/15/22 s/p fall with head trauma. CTH shows diffuse scattered traumatic pattern subarachnoid hemorrhage. Cortrak placed 8/12. PMHx includes COPD, prior NSTEMI / CHF, NSVT, PAF, anemia, bradycardia, senile osteoporosis, CAD, HLD, HTN    PT Comments  Pt asleep beginning of session, roused with stimulation but upon waking pt quite resistive to mvmt. Pt tolerated sitting EOB >20 mins with as little as contact guard A but needed mod A at times due to posterior lean. Pt resistant to fwd wt shift. Came to partial stand 2x but each time he quickly pushed back to sitting. Pt more and more agitated despite encouragement from therapist and his daughter. Pt then clear that he wanted to lie back down. Max A +2 to return to supine and position in bed. Pt continues to be limited by anxiety and fear of falling. Patient will benefit from continued inpatient follow up therapy, <3 hours/day PT will continue to follow.     If plan is discharge home, recommend the following: Two people to help with walking and/or transfers;Two people to help with bathing/dressing/bathroom;Direct supervision/assist for financial management;Direct supervision/assist for medications management;Assistance with feeding;Help with stairs or ramp for entrance;Assist for transportation;Assistance with cooking/housework   Can travel by private vehicle     No  Equipment Recommendations  Other (comment) (TBD)    Recommendations for Other Services       Precautions / Restrictions Precautions Precautions: Fall;Other (comment) Precaution Comments: cortrak; bil mitts; combative intermittently; watch HR Restrictions Weight Bearing Restrictions: No     Mobility  Bed Mobility Overal bed mobility: Needs Assistance Bed Mobility: Supine to Sit Rolling:  Mod assist   Supine to sit: HOB elevated, Mod assist, +2 for physical assistance Sit to supine: +2 for physical assistance, +2 for safety/equipment, Total assist   General bed mobility comments: pt intiating supine to sit on his own but not when cued. Tactile cues needed to get pt to assist with elevation of trunk. With first time up into sitting pt extending through hips and unsafe EOB. Returned to supine with tot A +2 and rolled into SL first and then came to sitting at which point he allowed hip flexion and could sit EOB with mod A at times due to posterior lean but sometimes as little as contact guard    Transfers Overall transfer level: Needs assistance Equipment used: 2 person hand held assist, Ambulation equipment used Transfers: Sit to/from Stand Sit to Stand: +2 safety/equipment, Max assist           General transfer comment: attempted sit>stand 6-7x with manual facilitation of fwd wt shift but pt resisting and finally asking to lie back down. Daughter, Marcelino Duster, present and encouraging pt as well. Pt very anxious in sitting EOB, seems very fearufl of falling and winces with trunk flexion Transfer via Lift Equipment: Stedy  Ambulation/Gait               General Gait Details: unable at this time   Optometrist     Tilt Bed    Modified Rankin (Stroke Patients Only)       Balance Overall balance assessment: Needs assistance Sitting-balance support: Feet supported, Bilateral upper extremity supported, No upper extremity supported Sitting balance-Leahy Scale: Poor Sitting balance - Comments: needed  mod A in sitting most of the time due to posterior lean Postural control: Posterior lean Standing balance support: Bilateral upper extremity supported Standing balance-Leahy Scale: Poor Standing balance comment: achieved partial stand 2x but promptly leaned bkwds returning to sitting                            Cognition  Arousal: Obtunded Behavior During Therapy: Anxious Overall Cognitive Status: Impaired/Different from baseline Area of Impairment: Attention, Memory, Following commands, Safety/judgement, Awareness, Problem solving               Rancho Levels of Cognitive Functioning Rancho Los Amigos Scales of Cognitive Functioning: Confused, Inappropriate Non-Agitated: Maximal Assistance Orientation Level: Place, Time, Situation Current Attention Level: Focused Memory: Decreased recall of precautions, Decreased short-term memory Following Commands: Follows one step commands inconsistently, Follows one step commands with increased time Safety/Judgement: Decreased awareness of safety, Decreased awareness of deficits Awareness: Intellectual Problem Solving: Slow processing, Decreased initiation, Difficulty sequencing, Requires verbal cues, Requires tactile cues General Comments: pt initially asleep, rouses with stimulation but was then resistant throughout   [facility] Scales of Cognitive Functioning: Confused, Inappropriate Non-Agitated: Maximal Assistance [V]    Exercises      General Comments General comments (skin integrity, edema, etc.): VSS and pt calm once returned to supine. Lights left on for stimulation in the middle of the day. Daughter turning music back on      Pertinent Vitals/Pain Pain Assessment Pain Assessment: Faces Faces Pain Scale: Hurts little more Pain Location: generalized with movement Pain Descriptors / Indicators: Discomfort, Grimacing, Moaning Pain Intervention(s): Limited activity within patient's tolerance, Monitored during session, Repositioned    Home Living                          Prior Function            PT Goals (current goals can now be found in the care plan section) Acute Rehab PT Goals Patient Stated Goal: family hopes for pt to improve PT Goal Formulation: With patient/family Time For Goal Achievement: 01/20/23 Potential to  Achieve Goals: Fair Progress towards PT goals: Not progressing toward goals - comment (lethargy, resistance)    Frequency    Min 1X/week      PT Plan      Co-evaluation              AM-PAC PT "6 Clicks" Mobility   Outcome Measure  Help needed turning from your back to your side while in a flat bed without using bedrails?: A Little Help needed moving from lying on your back to sitting on the side of a flat bed without using bedrails?: A Little Help needed moving to and from a bed to a chair (including a wheelchair)?: Total Help needed standing up from a chair using your arms (e.g., wheelchair or bedside chair)?: A Lot Help needed to walk in hospital room?: Total Help needed climbing 3-5 steps with a railing? : Total 6 Click Score: 11    End of Session Equipment Utilized During Treatment: Gait belt Activity Tolerance: Other (comment) (limited by anxiety) Patient left: with call bell/phone within reach;with family/visitor present;in bed;with restraints reapplied Nurse Communication: Mobility status PT Visit Diagnosis: Other abnormalities of gait and mobility (R26.89);Muscle weakness (generalized) (M62.81);Other symptoms and signs involving the nervous system (R29.898);Pain;Unsteadiness on feet (R26.81);Difficulty in walking, not elsewhere classified (R26.2) Pain - part of body:  (genealized)  Time: 1223-1255 PT Time Calculation (min) (ACUTE ONLY): 32 min  Charges:    $Therapeutic Activity: 23-37 mins PT General Charges $$ ACUTE PT VISIT: 1 Visit                     Lyanne Co, PT  Acute Rehab Services Secure chat preferred Office [phone]    Lawana Chambers Rhyan Wolters 01/09/2023, 1:08 PM

## 2023-01-09 NOTE — TOC Progression Note (Addendum)
Transition of Care (TOC) - Progression Note    Patient Details  Name: James Bonnici Sr. MRN: [id number] Date of Birth: 10-03-1931  Transition of Care [facility]) CM/SW Contact  Inis Sizer, LCSW Phone Number: 01/09/2023, 9:09 AM  Clinical Narrative:    11:35am: CSW spoke with [name] who requested CSW send referrals to [place], [facility], and [place].   CSW sent referrals and will update daughter if bed offers become available.  9am: CSW spoke with patient's wife [name] who requested CSW contact patient's daughter [name] for presentation of bed offers.  CSW spoke with [name] to inform her of bed offers. [name] to review list and return call to CSW with decision.   Expected Discharge Plan: Skilled Nursing Facility Barriers to Discharge: Continued Medical Work up  Expected Discharge Plan and Services In-house Referral: Clinical Social Work                                             Social Determinants of Health (SDOH) Interventions SDOH Screenings   Food Insecurity: No Food Insecurity (12/16/2022)  Housing: Low Risk  (12/16/2022)  Transportation Needs: No Transportation Needs (12/16/2022)  Utilities: Not At Risk (12/16/2022)  Alcohol Screen: Low Risk  (07/08/2022)  Depression (PHQ2-9): Low Risk  (07/26/2022)  Financial Resource Strain: Low Risk  (03/30/2022)  Physical Activity: Insufficiently Active (01/29/2021)  Social Connections: Socially Integrated (01/29/2021)  Stress: No Stress Concern Present (01/29/2021)  Tobacco Use: Medium Risk (01/05/2023)    Readmission Risk Interventions     No data to display

## 2023-01-09 NOTE — Plan of Care (Signed)

## 2023-01-09 NOTE — Progress Notes (Signed)
[facility] PROGRESS NOTE  James Auvil Sr. (DOB: Feb 23, 1932) ZOX:[id number]   PCP: Blane Ohara, MD    Brief Narrative: James Simonich Sr. is an 89 y.o. male with a history of A-fib, NSVT, ischemic cardiomyopathy (NSTEMI s/p LAD stent 2022), HFrEF (LVEF improved to 55% 2022 from 35%), HTN, COPD, hypothyroidism, chronic anemia, frequent falls who presented 8/8 after a fall found to have a traumatic subarachnoid hemorrhage.   Hospitalization has been complicated by E. coli UTI, asymptomatic runs of AFib and PVCs for which cardiology was consulted, acute urinary retention for which urology was consulted, placed urinary catheter 8/12, dysphagia for which cortrak tube has been placed, and persistent encephalopathy.   Subjective: Patient is about the same as yesterday.  Mumbling to himself.  Eyes are closed.  Objective: BP 131/74 (BP Location: Left Arm)   Pulse (!) 22   Temp 100.3 F (37.9 C) (Axillary)   Resp (!) 22   Ht 6' (1.829 m)   Wt 54.3 kg   SpO2 96%   BMI 16.24 kg/m    General appearance: Eyes are closed.  Mumbling to himself.  Does not respond to questions. Resp: Clear to auscultation bilaterally.  Normal effort Cardio: S1-S2 is normal regular.  No S3-S4.  No rubs murmurs or bruit GI: Abdomen is soft.  Nontender nondistended.  Bowel sounds are present normal.  No masses organomegaly    Assessment & Plan: Traumatic subarachnoid hemorrhage/subdural hematoma Patient was seen by neurosurgery.   CT 8/13 mentioned vasogenic edema and expected redistribution of contusions.  Repeat CT scan 8/26 shows improvement in size of SDH. Neurologically he seems to be stable though is still quite encephalopathic.  Fever, persistent leukocytosis Isolated fever on 8/19. No s/sxs or VTE and has been getting prophylaxis w/heparin. Chest x ray normal. UA without pyuria. No cellulitis by exam. No meningismus.  Persistent leukocytosis was noted.  This is likely reactive.  Given IV  fluids with improvement in counts.  Give additional fluids for next 24 hours since WBC is climbing.  Low-grade fever noted this morning.  Will order chest x-ray. Recently completed 7-day course of ceftriaxone. No clear indication to resume antibiotics.  TBI with acute encephalopathy:  - Optimize medical and nutritional status - He did not have significant delirium during hospitalization in 2022, so this is likely related to TBI/hemorrhage.   Severe protein calorie malnutrition/dysphagia Body mass index is 16.24 kg/m.  - Feeding thru cortrak. Keep NPO per SLP evaluation.  Daughter agreeable to PEG tube.  Interventional radiology was consulted.  Plan was for PEG tube placement on 8/30 however due to some miscommunication patient was started on feedings instead of being left NPO.  As a result procedure had to be rescheduled, tentatively planned for 9/3   E. coli UTI Resistant to ancef. Received ceftriaxone x7 days     Acute urinary retention, BPH, prostate CA s/p brachytherapy: Urology was consulted, placed catheter 8/12 over a wire. Will continue catheter until more mobile.  To go to SNF with catheter.  Will need to be seen by urology in the outpatient setting for voiding trial.   PAF w/RVR, frequent PVCs, hx NSVT Heart rate is better today.  Continue metoprolol.   Holding full dose anticoagulation due to fall risk and current SAH, SDH. Not a good candidate for Watchman device.   CAD s/p LAD stent 2022 Elevated troponin is mild at 32, flat, without angina consistent with demand myocardial ischemia.  Continue beta blocker, statin. Not on antiplatelet due  to bleeding.   Chronic HFrEF with improved EF (35>55%, 2022): Continue metoprolol.   Remains euvolemic.   Hypokalemia Resolved. Elevated potassium level today noted.  Not getting potassium supplements.  Will hydrate and recheck labs tomorrow.  Hyponatremia:  Stable.  Continue to monitor.   Hypophosphatemia:  Resolved with  supplementation.   Chronic normocytic anemia:  No bleeding, urine without gross hematuria. Deferred GI work up PTA due to frailty.  - Monitor intermittently, remains stable.   Moderate persistent asthma, uncomplicated No exacerbation. - Continue DuoNebs.   Acquired hypothyroidism TSH is 6.177, free T4 is normal.  - Continue levothyroxine 175 mcg daily   Essential hypertension Blood pressure is stable today.  Continue to monitor.   Debility, deconditioning, frailty At current level, would require SNF level of care. Continue mobilizing as able.   Constipation -bowel regimen PRN  DVT prophylaxis: Subcutaneous heparin CODE STATUS: Full code Family communication: Daughter being updated frequently. Disposition: SNF when medically improved    Osvaldo Shipper,  [facility] [email or web address] 01/09/2023, 8:58 AM

## 2023-01-10 ENCOUNTER — Inpatient Hospital Stay (HOSPITAL_COMMUNITY): Payer: Medicare HMO

## 2023-01-10 DIAGNOSIS — I609 Nontraumatic subarachnoid hemorrhage, unspecified: Secondary | ICD-10-CM | POA: Diagnosis not present

## 2023-01-10 DIAGNOSIS — I480 Paroxysmal atrial fibrillation: Secondary | ICD-10-CM | POA: Diagnosis not present

## 2023-01-10 DIAGNOSIS — G9341 Metabolic encephalopathy: Secondary | ICD-10-CM | POA: Diagnosis not present

## 2023-01-10 DIAGNOSIS — D72829 Elevated white blood cell count, unspecified: Secondary | ICD-10-CM | POA: Diagnosis not present

## 2023-01-10 HISTORY — PX: IR GASTROSTOMY TUBE MOD SED: IMG625

## 2023-01-10 LAB — BASIC METABOLIC PANEL
Anion gap: 9 (ref 5–15)
BUN: 18 mg/dL (ref 8–23)
CO2: 21 mmol/L — ABNORMAL LOW (ref 22–32)
Calcium: 8.1 mg/dL — ABNORMAL LOW (ref 8.9–10.3)
Chloride: 100 mmol/L (ref 98–111)
Creatinine, Ser: 0.6 mg/dL — ABNORMAL LOW (ref 0.61–1.24)
GFR, Estimated: >60 mL/min (ref >60)
Glucose, Bld: 99 mg/dL (ref 70–99)
Potassium: 4 mmol/L (ref 3.5–5.1)
Sodium: 130 mmol/L — ABNORMAL LOW (ref 135–145)

## 2023-01-10 LAB — CBC
HCT: 30.3 % — ABNORMAL LOW (ref 39.0–52.0)
Hemoglobin: 9.9 g/dL — ABNORMAL LOW (ref 13.0–17.0)
MCH: 30.9 pg (ref 26.0–34.0)
MCHC: 32.7 g/dL (ref 30.0–36.0)
MCV: 94.7 fL (ref 80.0–100.0)
Platelets: 310 10*3/uL (ref 150–400)
RBC: 3.2 MIL/uL — ABNORMAL LOW (ref 4.22–5.81)
RDW: 13 % (ref 11.5–15.5)
WBC: 10.8 10*3/uL — ABNORMAL HIGH (ref 4.0–10.5)
nRBC: 0 % (ref 0.0–0.2)

## 2023-01-10 LAB — GLUCOSE, CAPILLARY
Glucose-Capillary: 104 mg/dL — ABNORMAL HIGH (ref 70–99)
Glucose-Capillary: 110 mg/dL — ABNORMAL HIGH (ref 70–99)
Glucose-Capillary: 116 mg/dL — ABNORMAL HIGH (ref 70–99)
Glucose-Capillary: 127 mg/dL — ABNORMAL HIGH (ref 70–99)
Glucose-Capillary: 152 mg/dL — ABNORMAL HIGH (ref 70–99)
Glucose-Capillary: 159 mg/dL — ABNORMAL HIGH (ref 70–99)
Glucose-Capillary: 90 mg/dL (ref 70–99)

## 2023-01-10 LAB — PROCALCITONIN: Procalcitonin: 0.13 ng/mL

## 2023-01-10 MED ORDER — CEFAZOLIN SODIUM-DEXTROSE 2-4 GM/100ML-% IV SOLN
2.0000 g | INTRAVENOUS | Status: AC
  Filled 2023-01-10: qty 100

## 2023-01-10 MED ORDER — MIDAZOLAM HCL 2 MG/2ML IJ SOLN
INTRAMUSCULAR | Status: DC | PRN
Start: 2023-01-10 — End: 2023-01-19
  Administered 2023-01-10: 1 mg via INTRAVENOUS

## 2023-01-10 MED ORDER — LIDOCAINE-EPINEPHRINE 1 %-1:100000 IJ SOLN
20.0000 mL | Freq: Once | INTRAMUSCULAR | Status: AC
  Administered 2023-01-10: 20 mL via INTRADERMAL

## 2023-01-10 MED ORDER — SODIUM CHLORIDE 0.45 % IV SOLN: INTRAVENOUS | Status: AC

## 2023-01-10 MED ORDER — FENTANYL CITRATE (PF) 100 MCG/2ML IJ SOLN
INTRAMUSCULAR | Status: AC
  Filled 2023-01-10: qty 2

## 2023-01-10 MED ORDER — FREE WATER
100.0000 mL | Freq: Two times a day (BID) | Status: DC
  Administered 2023-01-10 – 2023-01-18 (×15): 100 mL

## 2023-01-10 MED ORDER — FENTANYL CITRATE (PF) 100 MCG/2ML IJ SOLN
INTRAMUSCULAR | Status: DC | PRN
Start: 2023-01-10 — End: 2023-01-19
  Administered 2023-01-10: 50 ug via INTRAVENOUS

## 2023-01-10 MED ORDER — LIDOCAINE-EPINEPHRINE 1 %-1:100000 IJ SOLN
INTRAMUSCULAR | Status: AC
  Filled 2023-01-10: qty 1

## 2023-01-10 MED ORDER — MIDAZOLAM HCL 2 MG/2ML IJ SOLN
INTRAMUSCULAR | Status: AC
  Filled 2023-01-10: qty 2

## 2023-01-10 MED ORDER — SODIUM CHLORIDE 0.9 % IV SOLN: INTRAVENOUS | Status: DC | PRN

## 2023-01-10 MED ORDER — BACITRACIN-NEOMYCIN-POLYMYXIN OINTMENT TUBE
TOPICAL_OINTMENT | Freq: Every day | CUTANEOUS | Status: AC
  Filled 2023-01-10 (×2): qty 14

## 2023-01-10 MED ORDER — IOHEXOL 300 MG/ML  SOLN
10.0000 mL | Freq: Once | INTRAMUSCULAR | Status: AC | PRN
  Administered 2023-01-10: 10 mL

## 2023-01-10 MED ORDER — OSMOLITE 1.2 CAL PO LIQD
1000.0000 mL | ORAL | Status: DC
  Administered 2023-01-10 – 2023-01-18 (×11): 1000 mL
  Filled 2023-01-10: qty 1000

## 2023-01-10 NOTE — Progress Notes (Signed)
Patient transported off the unit via transport team to Interventional Radiology.

## 2023-01-10 NOTE — Progress Notes (Signed)
[facility] PROGRESS NOTE  James Steuerwald Sr. (DOB: 03-26-1932) WCB:[id number]   PCP: Blane Ohara, MD    Brief Narrative: James Schaefer Sr. is an 89 y.o. male with a history of A-fib, NSVT, ischemic cardiomyopathy (NSTEMI s/p LAD stent 2022), HFrEF (LVEF improved to 55% 2022 from 35%), HTN, COPD, hypothyroidism, chronic anemia, frequent falls who presented 8/8 after a fall found to have a traumatic subarachnoid hemorrhage.   Hospitalization has been complicated by E. coli UTI, asymptomatic runs of AFib and PVCs for which cardiology was consulted, acute urinary retention for which urology was consulted, placed urinary catheter 8/12, dysphagia for which cortrak tube has been placed, and persistent encephalopathy.   Subjective: Patient remains encephalopathic.  Mumbling.  Does not answer any questions.  Moving his extremities.  Objective: BP 127/67   Pulse 93   Temp 98.9 F (37.2 C) (Oral)   Resp 16   Ht 6' (1.829 m)   Wt 54.1 kg   SpO2 98%   BMI 16.18 kg/m     General appearance: Eyes are closed.  He continues to mumble. Resp: Clear to auscultation bilaterally.  Normal effort Cardio: S1-S2 is normal regular.  No S3-S4.  No rubs murmurs or bruit GI: Abdomen is soft.  Nontender nondistended.  Bowel sounds are present normal.  No masses organomegaly Noted to be moving all of his extremities    Assessment & Plan: Traumatic subarachnoid hemorrhage/subdural hematoma Patient was seen by neurosurgery.   CT 8/13 mentioned vasogenic edema and expected redistribution of contusions.  Repeat CT scan 8/26 shows improvement in size of SDH. Neurologically he seems to be stable though is still quite encephalopathic.  Fever, persistent leukocytosis Isolated fever on 8/19. No s/sxs or VTE and has been getting prophylaxis w/heparin. Chest x ray normal. UA without pyuria. No cellulitis by exam. No meningismus.  Persistent leukocytosis was noted.  This is likely reactive.  Given IV  fluids with improvement in counts.   WBC again noted to be climbing as of 2 days ago.  He was noted to have low-grade fever UA noted to be abnormal.  Chest x-ray without any acute findings. He had recently completed 7 days of ceftriaxone for E. coli UTI.  Urine culture repeated.  Started back on ceftriaxone.    TBI with acute encephalopathy:  - Optimize medical and nutritional status - He did not have significant delirium during hospitalization in 2022, so this is likely related to TBI/hemorrhage.   Severe protein calorie malnutrition/dysphagia Body mass index is 16.18 kg/m.  - Feeding thru cortrak. Keep NPO per SLP evaluation.  Daughter agreeable to PEG tube.  Interventional radiology was consulted.   PEG tube to be placed this morning.     E. coli UTI Resistant to ancef. Received ceftriaxone x7 days. See above regarding concerns for recurrent UTI.  UA noted to be abnormal.  Follow-up on urine cultures.  Ceftriaxone was reinitiated.   Acute urinary retention, BPH, prostate CA s/p brachytherapy: Urology was consulted, placed catheter 8/12 over a wire. Will continue catheter until more mobile.  To go to SNF with catheter.  Will need to be seen by urology in the outpatient setting for voiding trial.   PAF w/RVR, frequent PVCs, hx NSVT Heart rate is better.  Continue metoprolol.   Holding full dose anticoagulation due to fall risk and current SAH, SDH. Not a good candidate for Watchman device.   CAD s/p LAD stent 2022 Elevated troponin is mild at 32, flat, without angina consistent with  demand myocardial ischemia.  Continue beta blocker, statin. Not on antiplatelet due to bleeding.   Chronic HFrEF with improved EF (35>55%, 2022): Continue metoprolol.   Remains euvolemic.   Hypokalemia Resolved.  Hyponatremia:  Stable.  Continue to monitor.   Hypophosphatemia:  Resolved with supplementation.   Chronic normocytic anemia:  No bleeding, urine without gross hematuria. Deferred GI  work up PTA due to frailty.  - Monitor intermittently, remains stable.   Moderate persistent asthma, uncomplicated No exacerbation. - Continue DuoNebs.   Acquired hypothyroidism TSH is 6.177, free T4 is normal.  - Continue levothyroxine 175 mcg daily   Essential hypertension Blood pressure is stable today.  Continue to monitor.   Debility, deconditioning, frailty At current level, would require SNF level of care. Continue mobilizing as able.   Constipation -bowel regimen PRN  Goals of care Seen by palliative care previously but family requested that they did not want to talk to palliative care anymore.  Patient's encephalopathy has not improved in almost 1 month.  Long-term prognosis remains unclear but is guarded at the very least.    DVT prophylaxis: Subcutaneous heparin CODE STATUS: Full code Family communication: Daughter being updated frequently. Disposition: SNF when medically improved    [name],  [facility] [email or web address] 01/10/2023, 9:42 AM

## 2023-01-10 NOTE — Progress Notes (Signed)
Speech Language Pathology Treatment: Cognitive-Linquistic  Patient Details Name: James Infante Sr. MRN: [id number] DOB: 07/26/1931 Today's Date: 01/10/2023 Time: 4696-2952 SLP Time Calculation (min) (ACUTE ONLY): 13 min  Assessment / Plan / Recommendation Clinical Impression  Pt drowsy after medication administration for PEG placement. Pt's daughter and sister present in room. SLP played music, which pt kicked his foot to the beat of throughout the session. He roused minimally to verbal and tactile cueing, although overall remained quite drowsy and was unable to participate in following commands or various other attempted cognitive tasks. SLP provided oral care with pt frequently biting down on the sponge and only intermittently following commands to open his mouth to release. Provided education to pt's family regarding current presentation and plan for continued SLP intervention. Will continue to follow.    HPI HPI: Pt is an 89 y.o. male who presented with AMS after a fall. CT head: Scattered areas of subarachnoid hemorrhage in the left occipital  lobe, bilateral high frontal lobes, and right temporal lobe. Small amount of subarachnoid hemorrhage posterior to the right midbrain extending into the cerebral aqueduct. CTH 8/26 with decreased size and density of previously seen multifocal SAH. PMH: COPD, prior NSTEMI / CHF, NSVT, PAF, on ASA81.      SLP Plan  Continue with current plan of care      Recommendations for follow up therapy are one component of a multi-disciplinary discharge planning process, led by the attending physician.  Recommendations may be updated based on patient status, additional functional criteria and insurance authorization.    Recommendations                     Oral care QID;Staff/trained caregiver to provide oral care;Oral care prior to ice chip/H20   Frequent or constant Supervision/Assistance Dysphagia, oropharyngeal phase (R13.12);Cognitive  communication deficit (W41.324)     Continue with current plan of care     Gwynneth Aliment, M.A., CF-SLP Speech Language Pathology, Acute Rehabilitation Services  Secure Chat preferred [phone]   01/10/2023, 12:32 PM

## 2023-01-10 NOTE — TOC Progression Note (Signed)
Transition of Care (TOC) - Progression Note    Patient Details  Name: [name]. MRN: [id number] Date of Birth: [birth year]  Transition [facility] Care [facility]) CM/SW Contact  Eduard Roux, [place] Phone Number: 01/10/2023, 3:48 PM  Clinical Narrative:     Rehabs Follow up- [facility] - no response  [facility] has no beds available  [facility] unable to offer  [facility] - currently has availability- will need to follow up when patient is medically stable- Patient will need auth for SNF.   Expected Discharge Plan: Skilled Nursing Facility Barriers to Discharge: Continued Medical Work up  Expected Discharge Plan and Services In-house Referral: Clinical Social Work                                             Social Determinants of Health (SDOH) Interventions SDOH Screenings   Food Insecurity: No Food Insecurity (12/16/2022)  Housing: Low Risk  (12/16/2022)  Transportation Needs: No Transportation Needs (12/16/2022)  Utilities: Not At Risk (12/16/2022)  Alcohol Screen: Low Risk  (07/08/2022)  Depression (PHQ2-9): Low Risk  (07/26/2022)  Financial Resource Strain: Low Risk  (03/30/2022)  Physical Activity: Insufficiently Active (01/29/2021)  Social Connections: Socially Integrated (01/29/2021)  Stress: No Stress Concern Present (01/29/2021)  Tobacco Use: Medium Risk (01/05/2023)    Readmission Risk Interventions     No data to display

## 2023-01-10 NOTE — TOC Progression Note (Signed)
Transition of Care (TOC) - Progression Note    Patient Details  Name: James Bonser Sr. MRN: [id number] Date of Birth: [birth year]  Transition of Care [facility]) CM/SW Contact  Eduard Roux, [place] Phone Number: 01/10/2023, 11:35 AM  Clinical Narrative:     Met with patient's daughter- requested to follow up a/ [facility], [facility], [facility]( informed they are private pay) if they have no availability her SNF choice is [facility].    [facility] - left voice mail w/admission [facility]- sent message- they will  review   TOC will continue to follow and assist with discharge planning.  Antony Blackbird, MSW, LCSW Clinical Social Worker    Expected Discharge Plan: Skilled Nursing Facility Barriers to Discharge: Continued Medical Work up  Expected Discharge Plan and Services In-house Referral: Clinical Social Work                                             Social Determinants of Health (SDOH) Interventions SDOH Screenings   Food Insecurity: No Food Insecurity (12/16/2022)  Housing: Low Risk  (12/16/2022)  Transportation Needs: No Transportation Needs (12/16/2022)  Utilities: Not At Risk (12/16/2022)  Alcohol Screen: Low Risk  (07/08/2022)  Depression (PHQ2-9): Low Risk  (07/26/2022)  Financial Resource Strain: Low Risk  (03/30/2022)  Physical Activity: Insufficiently Active (01/29/2021)  Social Connections: Socially Integrated (01/29/2021)  Stress: No Stress Concern Present (01/29/2021)  Tobacco Use: Medium Risk (01/05/2023)    Readmission Risk Interventions     No data to display

## 2023-01-10 NOTE — Progress Notes (Signed)
OT Cancellation Note  Patient Details Name: James Coelho Sr. MRN: [id number] DOB: [birth year]   Cancelled Treatment:    Reason Eval/Treat Not Completed: Patient at procedure or test/ unavailable (Having peg placed. will follow up another time.)  [facility] 01/10/2023, 9:05 AM Luisa Dago, OT/L   Acute OT Clinical Specialist Acute Rehabilitation Services Pager [phone] Office [phone]

## 2023-01-10 NOTE — Procedures (Signed)
Interventional Radiology Procedure Note  Procedure: Placement of percutaneous 56F balloon-retention gastrostomy tube.  Complications: None  EBL:  < 5 mL  Recommendations: - NPO for 4 hours after placement - Maintain G-tube to low wall suction for 4 hours after placement - May advance diet as tolerated and begin using tube 4 hours after placement - Gastropexy sutures are absorbable and do not require removal  Marliss Coots, MD Pager: [phone]

## 2023-01-10 NOTE — Progress Notes (Signed)
Nutrition Follow-up  DOCUMENTATION CODES:   Severe malnutrition in context of chronic illness  INTERVENTION:   Resume tube feeds via G-tube today at 1330 (4 hours after placement): - Osmolite 1.2 @ 65 ml/hr (1560 ml/day) - Add PROSource TF20 60 ml daily - Free water flushes per MD, currently 100 ml every 12 hours  Tube feeding regimen provides 1952 kcal, 106 grams of protein, and 1279 ml of H2O.  Total free water with flushes: 1479 ml  NUTRITION DIAGNOSIS:   Severe Malnutrition related to chronic illness (COPD, HFrEF) as evidenced by severe fat depletion, severe muscle depletion.  Ongoing, being addressed via TF  GOAL:   Patient will meet greater than or equal to 90% of their needs  Met via TF  MONITOR:   Diet advancement, Labs, Weight trends, TF tolerance  REASON FOR ASSESSMENT:   Consult Assessment of nutrition requirement/status, Enteral/tube feeding initiation and management  ASSESSMENT:   89 year old male who presented to the ED on 8/08 after a fall with head trauma. PMH of COPD, prior NSTEMI, HFrEF, PAF, HTN, HLD. Pt admitted with SAH, acute metabolic encephalopathy.  08/09 - diet advanced to full liquids 08/11 - SLP evaluation, recommendation for full liquid diet 08/12 - SLP recommending NPO due to lethargy, Cortrak placed (tip gastric) 8/21 - MBS with recommendations for NPO 8/28 - MBS with recommendations for NPO 8/29 - Cortrak clogged 8/30 - Cortrak replaced (tip distal stomach) 9/03 - s/p 20 French G-tube placement  Pt had G-tube placed this morning. Tube can be used for feeds 4 hours after placement. Tube feeding orders adjusted to resume at 1330 today. Discussed with RN.  Spoke with pt's daughter Marcelino Duster at bedside. Explained plan to resume tube feeds this afternoon. Weight stable over the last week, up slightly compared to weight at last RD visit.  Admit weight: 63.5 kg (appears to be stated/estimated) Current weight: 54.1 kg  Current TF  orders: Osmolite 1.2 @ 65 ml/hr, PROSource TF20 60 ml daily, free water flushes 100 ml every 12 hours  Medications reviewed and include: colace, pepcid, IV abx IVF: 1/2NS @ 50 ml/hr  Labs reviewed: sodium 130, WBC 10.8, hemoglobin 9.9 CBG's: 90-128 x 24 hours  UOP: 10,300 ml x 24 hours (? Accuracy) I/O's: -21.3 L since admit (? Accuracy)  Diet Order:   Diet Order             Diet NPO time specified  Diet effective now                   EDUCATION NEEDS:   Education needs have been addressed (spoke with pt's daughter)  Skin:  Skin Assessment: Reviewed RN Assessment  Last BM:  01/09/23 small type 7  Height:   Ht Readings from Last 1 Encounters:  12/16/22 6' (1.829 m)    Weight:   Wt Readings from Last 1 Encounters:  01/10/23 54.1 kg    Ideal Body Weight:  80.9 kg  BMI:  Body mass index is 16.18 kg/m.  Estimated Nutritional Needs:   Kcal:  1750-1950  Protein:  80-95 grams  Fluid:  1.7-1.9 L    Mertie Clause, MS, RD, LDN Registered Dietitian II Please see AMiON for contact information.

## 2023-01-10 NOTE — Progress Notes (Signed)
Occupational Therapy Treatment Patient Details Name: James Decaprio Sr. MRN: [id number] DOB: 07/14/1931 Today's Date: 01/10/2023   History of present illness Pt is an 89 y.o. male who presented 12/15/22 s/p fall with head trauma. CTH shows diffuse scattered traumatic pattern subarachnoid hemorrhage. Cortrak placed 8/12. Peg 01/10/2023. PMHx includes COPD, prior NSTEMI / CHF, NSVT, PAF, anemia, bradycardia, senile osteoporosis, CAD, HLD, HTN   OT comments  Pt's daughter present during session. [name] responds better to his daughter however unable to progress EOB this session due to apparent resistance to movement and agitation. Pt repositioned in chair position in bed. Bed positioned toward window and used recent pictures of [name]'s dog "Dia Sitter" to engage him in session. Improvement noted in ability to participate in conversation. Educated daughter on use of familiar functional activities to address delirium and rehab. Patient will benefit from continued inpatient follow up therapy, <3 hours/day. Acute OT to follow.       If plan is discharge home, recommend the following:  Two people to help with walking and/or transfers;Two people to help with bathing/dressing/bathroom;Assistance with cooking/housework;Assistance with feeding;Direct supervision/assist for medications management;Direct supervision/assist for financial management;Assist for transportation   Equipment Recommendations       Recommendations for Other Services      Precautions / Restrictions Precautions Precautions: Fall;Other (comment) Precaution Comments: bil mitts; combative intermittently       Mobility Bed Mobility               General bed mobility comments: total A with attmept ot roll; became more agitated therefore did not progress    Transfers                   General transfer comment: not attempted (moved into chair position in bed)     Balance                                            ADL either performed or assessed with clinical judgement   ADL                                         General ADL Comments: total A    Extremity/Trunk Assessment Upper Extremity Assessment Upper Extremity Assessment:  (BUE rigid; holding mittens)   Lower Extremity Assessment Lower Extremity Assessment: Defer to PT evaluation        Vision       Perception     Praxis      Cognition Arousal: Alert Behavior During Therapy: Restless Overall Cognitive Status: Impaired/Different from baseline Area of Impairment: Orientation, Attention, Memory, Following commands, Safety/judgement, Awareness, Problem solving               Rancho Levels of Cognitive Functioning [place] Scales of Cognitive Functioning: Confused/Agitated: Maximal Assistance Orientation Level: Disoriented to, Place, Time, Situation Current Attention Level: Focused Memory: Decreased short-term memory Following Commands:  (not following commands this date) Safety/Judgement: Decreased awareness of safety, Decreased awareness of deficits Awareness: Intellectual   General Comments: appears delirious; agitated at times; after moving pt toward window he began to recognize a pic of his dog Dia Sitter and how she rides in his car to see the goats [place]ant Scales of Cognitive Functioning: Confused/Agitated: Maximal Assistance [IV]  Exercises General Exercises - Lower Extremity Heel Slides: PROM, Both, 5 reps    Shoulder Instructions       General Comments      Pertinent Vitals/ Pain       Pain Assessment Pain Assessment: Faces Faces Pain Scale: Hurts a little bit Pain Location: generalized with movement Pain Descriptors / Indicators: Discomfort, Grimacing, Moaning Pain Intervention(s): Limited activity within patient's tolerance  Home Living                                          Prior Functioning/Environment               Frequency  Min 1X/week        Progress Toward Goals  OT Goals(current goals can now be found in the care plan section)  Progress towards OT goals: Not progressing toward goals - comment (due ot cognitive status)  Acute Rehab OT Goals Patient Stated Goal: per daughter for him to get better OT Goal Formulation: With family Time For Goal Achievement: 01/18/23 Potential to Achieve Goals: Poor ADL Goals Pt Will Perform Grooming: with contact guard assist;sitting Pt Will Perform Upper Body Bathing: with min assist;sitting Pt Will Perform Lower Body Bathing: with min assist;sitting/lateral leans Pt Will Transfer to Toilet: with mod assist;squat pivot transfer Pt Will Perform Toileting - Clothing Manipulation and hygiene: with mod assist;sitting/lateral leans Additional ADL Goal #1: Pt will make eye contact with speaker without cues 75% of the time Additional ADL Goal #2: Pt will maintain unsupported seated balance at min guard level for approx 5 min in preparation for ADL participation  Plan      Co-evaluation                 AM-PAC OT "6 Clicks" Daily Activity     Outcome Measure   Help from another person eating meals?: Total Help from another person taking care of personal grooming?: Total Help from another person toileting, which includes using toliet, bedpan, or urinal?: Total Help from another person bathing (including washing, rinsing, drying)?: Total Help from another person to put on and taking off regular upper body clothing?: Total Help from another person to put on and taking off regular lower body clothing?: Total 6 Click Score: 6    End of Session    OT Visit Diagnosis: Unsteadiness on feet (R26.81);Other symptoms and signs involving the nervous system (R29.898);Other symptoms and signs involving cognitive function   Activity Tolerance Treatment limited secondary to agitation   Patient Left in bed;with call bell/phone within reach;with bed alarm  set;with family/visitor present;with SCD's reapplied;with restraints reapplied (chair position)   Nurse Communication Mobility status        Time: 5366-4403 OT Time Calculation (min): 26 min  Charges: OT General Charges $OT Visit: 1 Visit OT Treatments $Self Care/Home Management : 23-37 mins  Luisa Dago, OT/L   Acute OT Clinical Specialist Acute Rehabilitation Services Pager [phone] Office [phone]   [facility] 01/10/2023, 6:03 PM

## 2023-01-11 DIAGNOSIS — I609 Nontraumatic subarachnoid hemorrhage, unspecified: Secondary | ICD-10-CM | POA: Diagnosis not present

## 2023-01-11 LAB — GLUCOSE, CAPILLARY
Glucose-Capillary: 124 mg/dL — ABNORMAL HIGH (ref 70–99)
Glucose-Capillary: 132 mg/dL — ABNORMAL HIGH (ref 70–99)
Glucose-Capillary: 114 mg/dL — ABNORMAL HIGH (ref 70–99)
Glucose-Capillary: 152 mg/dL — ABNORMAL HIGH (ref 70–99)
Glucose-Capillary: 155 mg/dL — ABNORMAL HIGH (ref 70–99)
Glucose-Capillary: 127 mg/dL — ABNORMAL HIGH (ref 70–99)

## 2023-01-11 LAB — BASIC METABOLIC PANEL
Anion gap: 12 (ref 5–15)
BUN: 18 mg/dL (ref 8–23)
CO2: 20 mmol/L — ABNORMAL LOW (ref 22–32)
Calcium: 8.5 mg/dL — ABNORMAL LOW (ref 8.9–10.3)
Chloride: 101 mmol/L (ref 98–111)
Creatinine, Ser: 0.62 mg/dL (ref 0.61–1.24)
GFR, Estimated: >60 mL/min (ref >60)
Glucose, Bld: 121 mg/dL — ABNORMAL HIGH (ref 70–99)
Potassium: 3.9 mmol/L (ref 3.5–5.1)
Sodium: 133 mmol/L — ABNORMAL LOW (ref 135–145)

## 2023-01-11 LAB — CBC
HCT: 35.3 % — ABNORMAL LOW (ref 39.0–52.0)
Hemoglobin: 11.3 g/dL — ABNORMAL LOW (ref 13.0–17.0)
MCH: 30.5 pg (ref 26.0–34.0)
MCHC: 32 g/dL (ref 30.0–36.0)
MCV: 95.4 fL (ref 80.0–100.0)
Platelets: 343 10*3/uL (ref 150–400)
RBC: 3.7 MIL/uL — ABNORMAL LOW (ref 4.22–5.81)
RDW: 12.9 % (ref 11.5–15.5)
WBC: 12.5 10*3/uL — ABNORMAL HIGH (ref 4.0–10.5)
nRBC: 0 % (ref 0.0–0.2)

## 2023-01-11 LAB — URINE CULTURE: Culture: >=100000 — AB

## 2023-01-11 LAB — VITAMIN B12: Vitamin B-12: 529 pg/mL (ref 180–914)

## 2023-01-11 MED ORDER — THIAMINE MONONITRATE 100 MG PO TABS
100.0000 mg | ORAL_TABLET | Freq: Every day | ORAL | Status: DC
  Administered 2023-01-11 – 2023-01-19 (×9): 100 mg
  Filled 2023-01-11 (×9): qty 1

## 2023-01-11 MED ORDER — SULFAMETHOXAZOLE-TRIMETHOPRIM 800-160 MG PO TABS
1.0000 | ORAL_TABLET | Freq: Two times a day (BID) | ORAL | Status: DC
  Administered 2023-01-11 – 2023-01-14 (×8): 1
  Filled 2023-01-11 (×10): qty 1

## 2023-01-11 NOTE — Progress Notes (Signed)
PROGRESS NOTE    James Tullar Sr.  ZOX:[id number] DOB: [birth year] DOA: 12/15/2022 PCP: Blane Ohara, MD   Brief Narrative: 89 year old with past medical history significant for A-fib, known SVT, ischemic cardiomyopathy (non-STEMI status post LAD stent 2022) heart failure reduced ejection fraction (left ventricular ejection fraction improved to 55 on 2022 from 35%), hypertension, COPD, hypothyroidism, chronic anemia, frequent falls presents AT/8 after a fall found to have a traumatic subarachnoid hemorrhage.  Hospitalization has been complicated by E. coli UTI, asymptomatic runs of A-fib and PVC for which cardiology was consulted, acute urinary retention for which urology was consulted, placed urinary catheter 8/12, dysphagia for which core track has been placed and persistent encephalopathy.     Assessment & Plan:   Principal Problem:   Subarachnoid hemorrhage (HCC) Active Problems:   Frequent falls   Acute metabolic encephalopathy   Frequent PVCs   PAF (paroxysmal atrial fibrillation) (HCC)   Chronic HFrEF with improved EF(heart failure with reduced ejection fraction) (HCC)   Ischemic cardiomyopathy   SIRS (systemic inflammatory response syndrome) (HCC)   CAD S/P LAD stent 2022   History of recent fall   Essential hypertension   Acquired hypothyroidism   Mixed conductive and sensorineural hearing loss, bilateral   Moderate persistent asthma, uncomplicated   Anemia   At high risk for bleeding   Hypokalemia   Protein-calorie malnutrition, severe   Urinary retention  1-Traumatic subarachnoid hemorrhage/subdural hematoma: -CT 8/13 mentioned vasogenic edema and expected redistribution of contusions.  Repeat CT scan 8/26 shows improvement in size of SDH. Still encephalopathic.  Had peg tube placed 9/03.  Recurrent UTI:  Fever, persistent leukocytosis Recurrent fevers and UTI during this hospitalization Recently completed 7 days of ceftriaxone for E. coli UTI Fever 2 days  ago and restarted on IV ceftriaxone. Urine culture growing enterobacter and E coli. Started on Bactrim. Will need 7-10 day Tx  TBI with acute encephalopathy Alert, aphasic. No significant improvement during this admission.  B 12 500, normal.  Thiamine level pending.  Start thiamine.   Protein caloric malnutrition/dysphagia Family agree with PEG tube placement. Started on tube feeding.   PEG tube placement 9/3; tolerating tube feeding.   E. coli UTI: completed ceftriaxone previously.   Acute urinary retention, BPH, prostate cancer status post brachytherapy Neurology was consulted Dr. Benita Gutter over a wire.  He will need catheter until he is more mobile. Will likely need to go to SNF with catheter Needs to follow up with urology for Voiding trial At end of week Urology will exchange foley catheter.   PAF w/RVR, frequent PVCs, hx NSVT Heart rate is better.  Continue metoprolol.   Holding full dose anticoagulation due to fall risk and current SAH, SDH. Not a good candidate for Watchman device.   CAD s/p LAD stent 2022 Elevated troponin is mild at 32, flat, without angina consistent with demand myocardial ischemia.  Continue beta blocker, statin. Not on antiplatelet due to bleeding.    Chronic HFrEF with improved EF (35>55%, 2022): Continue metoprolol.   Remains euvolemic.   Hypokalemia Resolved.   Hyponatremia:  Stable.  Continue to monitor.   Hypophosphatemia:  Resolved    Chronic normocytic anemia:     Moderate persistent asthma, uncomplicated No exacerbation. - Continue DuoNebs.   Acquired hypothyroidism TSH is 6.177, free T4 is normal.  - Continue levothyroxine 175 mcg daily   Essential hypertension Continue with metoprolol/    Debility, deconditioning, frailty -needs rehab.    Constipation -bowel regimen PRN  Nutrition Problem: Severe Malnutrition Etiology: chronic illness (COPD, HFrEF)    Signs/Symptoms: severe fat depletion, severe muscle  depletion    Interventions: Tube feeding, Refer to RD note for recommendations  Estimated body mass index is 16.18 kg/m as calculated from the following:   Height as of this encounter: 6' (1.829 m).   Weight as of this encounter: 54.1 kg.   DVT prophylaxis: heparin  Code Status: Full code Family Communication: care discussed with daughter.  Disposition Plan:  Status is: Inpatient Remains inpatient appropriate because: hopefully discharge by Friday     Consultants:  Neurosurgery   Procedures:    Antimicrobials:    Subjective: he is alert, speech difficult to understand.  Confuse.   Objective: Vitals:   01/10/23 2301 01/11/23 0332 01/11/23 0333 01/11/23 0800  BP: 133/88  131/84 111/72  Pulse: (!) 110  98 89  Resp: (!) 24  16 (!) 25  Temp: 99.4 F (37.4 C) 98.4 F (36.9 C) 98.4 F (36.9 C) 98.7 F (37.1 C)  TempSrc: Axillary  Axillary Axillary  SpO2: 97%  98% 96%  Weight:      Height:        Intake/Output Summary (Last 24 hours) at 01/11/2023 0819 Last data filed at 01/11/2023 0523 Gross per 24 hour  Intake --  Output 1050 ml  Net -1050 ml   Filed Weights   01/06/23 0500 01/09/23 0315 01/10/23 0253  Weight: 55.8 kg 54.3 kg 54.1 kg    Examination:  General exam: Appears calm and comfortable  Respiratory system: Clear to auscultation. Respiratory effort normal. Cardiovascular system: S1 & S2 heard, RRR. Gastrointestinal system: Abdomen is nondistended, soft and nontender. Peg tube placed Central nervous system: Alert  Extremities: Symmetric 5 x 5 power.   Data Reviewed: I have personally reviewed following labs and imaging studies  CBC: Recent Labs  Lab 01/06/23 0447 01/07/23 0408 01/09/23 0430 01/10/23 0721 01/11/23 0528  WBC 11.6* 12.3* 13.2* 10.8* 12.5*  NEUTROABS 8.0*  --   --   --   --   HGB 11.2* 11.2* 10.9* 9.9* 11.3*  HCT 34.7* 34.9* 34.3* 30.3* 35.3*  MCV 95.3 97.5 97.2 94.7 95.4  PLT 407* 399 346 310 343   Basic Metabolic  Panel: Recent Labs  Lab 01/05/23 0259 01/07/23 0408 01/09/23 0430 01/10/23 0721 01/11/23 0528  NA 132* 134* 135 130* 133*  K 4.3 4.2 5.3* 4.0 3.9  CL 100 100 105 100 101  CO2 23 23 24  21* 20*  GLUCOSE 124* 130* 137* 99 121*  BUN 28* 24* 24* 18 18  CREATININE 0.73 0.67 0.72 0.60* 0.62  CALCIUM 8.7* 8.9 8.5* 8.1* 8.5*  MG 2.2  --   --   --   --    GFR: Estimated Creatinine Clearance: 47.9 mL/min (by C-G formula based on SCr of 0.62 mg/dL). Liver Function Tests: No results for input(s): "AST", "ALT", "ALKPHOS", "BILITOT", "PROT", "ALBUMIN" in the last 168 hours. No results for input(s): "LIPASE", "AMYLASE" in the last 168 hours. No results for input(s): "AMMONIA" in the last 168 hours. Coagulation Profile: Recent Labs  Lab 01/05/23 0904  INR 1.0   Cardiac Enzymes: No results for input(s): "CKTOTAL", "CKMB", "CKMBINDEX", "TROPONINI" in the last 168 hours. BNP (last 3 results) Recent Labs    06/30/22 1210  PROBNP 810*   HbA1C: No results for input(s): "HGBA1C" in the last 72 hours. CBG: Recent Labs  Lab 01/10/23 1606 01/10/23 1922 01/10/23 2311 01/11/23 0503 01/11/23 0759  GLUCAP 159* 152*  127* 124* 132*   Lipid Profile: No results for input(s): "CHOL", "HDL", "LDLCALC", "TRIG", "CHOLHDL", "LDLDIRECT" in the last 72 hours. Thyroid Function Tests: No results for input(s): "TSH", "T4TOTAL", "FREET4", "T3FREE", "THYROIDAB" in the last 72 hours. Anemia Panel: No results for input(s): "VITAMINB12", "FOLATE", "FERRITIN", "TIBC", "IRON", "RETICCTPCT" in the last 72 hours. Sepsis Labs: Recent Labs  Lab 01/10/23 0721  PROCALCITON 0.13    Recent Results (from the past 240 hour(s))  Culture, Urine (Do not remove urinary catheter, catheter placed by urology or difficult to place)     Status: Abnormal (Preliminary result)   Collection Time: 01/09/23 11:15 AM   Specimen: Urine, Catheterized  Result Value Ref Range Status   Specimen Description URINE, CATHETERIZED   Final   Special Requests   Final    NONE Performed at [facility], [address], [place], [place] [address]    Culture >=100,000 COLONIES/mL ESCHERICHIA COLI (A)  Final   Report Status PENDING  Incomplete         Radiology Studies: IR GASTROSTOMY TUBE MOD SED  Result Date: 01/10/2023 INDICATION: 89 year old male with history of stroke and failure to thrive presenting for percutaneous gastrostomy tube placement. EXAM: PERC PLACEMENT GASTROSTOMY MEDICATIONS: The patient was currently receiving intravenous antibiotics as an inpatient. ANESTHESIA/SEDATION: Versed 1 mg IV; Fentanyl 50 mcg IV Moderate Sedation Time:  12 The patient was continuously monitored during the procedure by the interventional radiology nurse under my direct supervision. CONTRAST:  10mL OMNIPAQUE IOHEXOL 300 MG/ML SOLN - administered into the gastric lumen. FLUOROSCOPY TIME:  One mGy COMPLICATIONS: None immediate. PROCEDURE: Informed written consent was obtained from the patient after a thorough discussion of the procedural risks, benefits and alternatives. All questions were addressed. Maximal Sterile barrier Technique was utilized including caps, mask, sterile gowns, sterile gloves, sterile drape, hand hygiene and skin antiseptic. A timeout was performed prior to the initiation of the procedure. The patient was placed on the procedure table in the supine position. Pre-procedure abdominal film confirmed visualization of the transverse colon. The patient was prepped and draped in usual sterile fashion. The stomach was insufflated with air via the indwelling nasogastric tube. Under fluoroscopy, a puncture site was selected and local analgesia achieved with 1% lidocaine infiltrated subcutaneously. Under fluoroscopic guidance, a gastropexy needle was passed into the stomach and the T-bar suture was released. Entry into the stomach was confirmed with fluoroscopy, aspiration of air, and injection of contrast material. This was  repeated with an additional gastropexy suture (for a total of 2 fasteners). At the center of these gastropexy sutures, a dermatotomy was performed. An 18 gauge needle was passed into the stomach at the site of this dermatotomy, and position within the gastric lumen again confirmed under fluoroscopy using aspiration of air and contrast injection. An Amplatz guidewire was passed through this needle and intraluminal placement within the stomach was confirmed by fluoroscopy. The needle was removed. Over the guidewire, the percutaneous tract was dilated using a 10 mm non-compliant balloon. The balloon was deflated, then pushed into the gastric lumen followed in concert by the 20 Fr gastrostomy tube. The retention balloon of the percutaneous gastrostomy tube was inflated with 20 mL of sterile water. The tube was withdrawn until the retention balloon was at the edge of the gastric lumen. The external bumper was brought to the abdominal wall. Contrast was injected through the gastrostomy tube, confirming intraluminal positioning. The patient tolerated the procedure well without any immediate post-procedural complications. IMPRESSION: Technically successful placement of  20 Fr gastrostomy tube. Marliss Coots, MD [facility] Radiology Electronically Signed   By: Marliss Coots M.D.   On: 01/10/2023 09:41   DG CHEST PORT 1 VIEW  Result Date: 01/09/2023 CLINICAL DATA:  Fever EXAM: PORTABLE CHEST 1 VIEW COMPARISON:  01/01/2023 FINDINGS: Enteric tube extends into the stomach, beyond the inferior margin of the film. Stable heart size. Aortic atherosclerosis. Coronary artery stent. No focal airspace consolidation, pleural effusion, or pneumothorax. IMPRESSION: No acute cardiopulmonary findings Electronically Signed   By: Duanne Guess D.O.   On: 01/09/2023 13:09        Scheduled Meds:  Chlorhexidine Gluconate Cloth  6 each Topical Daily   docusate  100 mg Per Tube BID    famotidine  40 mg Oral Daily   feeding supplement (PROSource TF20)  60 mL Per Tube Daily   free water  100 mL Per Tube Q12H   heparin injection (subcutaneous)  5,000 Units Subcutaneous Q12H   levothyroxine  175 mcg Per Tube Q0600   loratadine  10 mg Per Tube Daily   metoprolol tartrate  25 mg Per Tube BID   neomycin-bacitracin-polymyxin   Topical Daily   mouth rinse  15 mL Mouth Rinse 4 times per day   terbinafine  250 mg Per Tube Daily   Continuous Infusions:  sodium chloride 50 mL/hr at 01/10/23 1334   sodium chloride 10 mL/hr at 01/10/23 1344    ceFAZolin (ANCEF) IV     cefTRIAXone (ROCEPHIN)  IV 1 g (01/10/23 1348)   feeding supplement (OSMOLITE 1.2 CAL) 1,000 mL (01/11/23 0320)     LOS: 26 days    Time spent: 35 minutes    Carman Essick A Goldia Ligman, MD [facility]   If 7PM-7AM, please contact night-coverage [email or web address]  01/11/2023, 8:19 AM

## 2023-01-11 NOTE — Progress Notes (Signed)
Referring Physician(s): Pokhrel,Laxman  Supervising Physician: Marliss Coots  Patient Status:  [facility] - In-pt  Chief Complaint:  Percutaneous gastric tube placed in IR 9/3  Subjective:  Doing well G tube in use Abd binder in place Family at bedside  Allergies: Clarithromycin  Medications: Prior to Admission medications   Medication Sig Start Date End Date Taking? Authorizing Provider  albuterol (VENTOLIN HFA) 108 (90 Base) MCG/ACT inhaler Inhale 2 puffs into the lungs every 6 (six) hours as needed for wheezing or shortness of breath. 10/12/22  Yes Cox, Kirsten, MD  aspirin 81 MG EC tablet Take 1 tablet (81 mg total) by mouth daily. Swallow whole. 06/16/21  Yes Georgeanna Lea, MD  ferrous sulfate (FERROUSUL) 325 (65 FE) MG tablet Take 1 tablet (325 mg total) by mouth daily with breakfast. 10/12/22  Yes Cox, Kirsten, MD  fexofenadine (ALLEGRA) 180 MG tablet Take 180 mg by mouth daily.   Yes [provider]  ipratropium-albuterol (DUONEB) 0.5-2.5 (3) MG/3ML SOLN Take 3 mLs by nebulization every 4 (four) hours as needed (shortness of breath or wheezing). 09/22/21  Yes Abigail Miyamoto, MD  levothyroxine (SYNTHROID) 175 MCG tablet TAKE ONE TABLET BY MOUTH BEFORE BREAKFAST 12/06/22  Yes Cox, Kirsten, MD  Nutritional Supplements (BOOST NUTRITIONAL ENERGY PO) Take 237 mLs by mouth in the morning and at bedtime. Drinking 1-2 per day   Yes [provider]  terbinafine (LAMISIL) 250 MG tablet Take 1 tablet (250 mg total) by mouth daily. 11/17/22  Yes McCaughan, Dia D, DPM  atorvastatin (LIPITOR) 80 MG tablet TAKE ONE TABLET BY MOUTH EVERY MORNING 12/19/22   Cox, Kirsten, MD  metoprolol succinate (TOPROL-XL) 50 MG 24 hr tablet TAKE ONE TABLET BY MOUTH EVERY MORNING WITH OR immediately following A meal 12/19/22   Cox, Kirsten, MD  Multiple Vitamins-Minerals (MULTIVITAMIN ADULT PO) Take 1 tablet by mouth daily. Unknown strength Patient not taking: Reported on 12/15/2022     [provider]     Vital Signs: BP 137/87 (BP Location: Left Arm)   Pulse 97   Temp 97.8 F (36.6 C) (Axillary)   Resp (!) 21   Ht 6' (1.829 m)   Wt 119 lb 4.3 oz (54.1 kg)   SpO2 99%   BMI 16.18 kg/m   Physical Exam Vitals reviewed.  Skin:    General: Skin is warm.     Comments: Site is clean and dry NT no bleeding No hematoma      Imaging: IR GASTROSTOMY TUBE MOD SED  Result Date: 01/10/2023 INDICATION: 89 year old male with history of stroke and failure to thrive presenting for percutaneous gastrostomy tube placement. EXAM: PERC PLACEMENT GASTROSTOMY MEDICATIONS: The patient was currently receiving intravenous antibiotics as an inpatient. ANESTHESIA/SEDATION: Versed 1 mg IV; Fentanyl 50 mcg IV Moderate Sedation Time:  12 The patient was continuously monitored during the procedure by the interventional radiology nurse under my direct supervision. CONTRAST:  10mL OMNIPAQUE IOHEXOL 300 MG/ML SOLN - administered into the gastric lumen. FLUOROSCOPY TIME:  One mGy COMPLICATIONS: None immediate. PROCEDURE: Informed written consent was obtained from the patient after a thorough discussion of the procedural risks, benefits and alternatives. All questions were addressed. Maximal Sterile barrier Technique was utilized including caps, mask, sterile gowns, sterile gloves, sterile drape, hand hygiene and skin antiseptic. A timeout was performed prior to the initiation of the procedure. The patient was placed on the procedure table in the supine position. Pre-procedure abdominal film confirmed visualization of the transverse colon. The patient was  prepped and draped in usual sterile fashion. The stomach was insufflated with air via the indwelling nasogastric tube. Under fluoroscopy, a puncture site was selected and local analgesia achieved with 1% lidocaine infiltrated subcutaneously. Under fluoroscopic guidance, a gastropexy needle was passed into the stomach and the T-bar suture was  released. Entry into the stomach was confirmed with fluoroscopy, aspiration of air, and injection of contrast material. This was repeated with an additional gastropexy suture (for a total of 2 fasteners). At the center of these gastropexy sutures, a dermatotomy was performed. An 18 gauge needle was passed into the stomach at the site of this dermatotomy, and position within the gastric lumen again confirmed under fluoroscopy using aspiration of air and contrast injection. An Amplatz guidewire was passed through this needle and intraluminal placement within the stomach was confirmed by fluoroscopy. The needle was removed. Over the guidewire, the percutaneous tract was dilated using a 10 mm non-compliant balloon. The balloon was deflated, then pushed into the gastric lumen followed in concert by the 20 Fr gastrostomy tube. The retention balloon of the percutaneous gastrostomy tube was inflated with 20 mL of sterile water. The tube was withdrawn until the retention balloon was at the edge of the gastric lumen. The external bumper was brought to the abdominal wall. Contrast was injected through the gastrostomy tube, confirming intraluminal positioning. The patient tolerated the procedure well without any immediate post-procedural complications. IMPRESSION: Technically successful placement of 20 Fr gastrostomy tube. Marliss Coots, MD [facility] [address] [facility] Electronically Signed   By: Marliss Coots M.D.   On: 01/10/2023 09:41   DG CHEST PORT 1 VIEW  Result Date: 01/09/2023 CLINICAL DATA:  Fever EXAM: PORTABLE CHEST 1 VIEW COMPARISON:  01/01/2023 FINDINGS: Enteric tube extends into the stomach, beyond the inferior margin of the film. Stable heart size. Aortic atherosclerosis. Coronary artery stent. No focal airspace consolidation, pleural effusion, or pneumothorax. IMPRESSION: No acute cardiopulmonary findings Electronically Signed   By: Duanne Guess D.O.   On:  01/09/2023 13:09    Labs:  CBC: Recent Labs    01/07/23 0408 01/09/23 0430 01/10/23 0721 01/11/23 0528  WBC 12.3* 13.2* 10.8* 12.5*  HGB 11.2* 10.9* 9.9* 11.3*  HCT 34.9* 34.3* 30.3* 35.3*  PLT 399 346 310 343    COAGS: Recent Labs    12/17/22 2025 01/05/23 0904  INR 1.1 1.0    BMP: Recent Labs    01/07/23 0408 01/09/23 0430 01/10/23 0721 01/11/23 0528  NA 134* 135 130* 133*  K 4.2 5.3* 4.0 3.9  CL 100 105 100 101  CO2 23 24 21* 20*  GLUCOSE 130* 137* 99 121*  BUN 24* 24* 18 18  CALCIUM 8.9 8.5* 8.1* 8.5*  CREATININE 0.67 0.72 0.60* 0.62  GFRNONAA >60 >60 >60 >60    LIVER FUNCTION TESTS: Recent Labs    07/08/22 1154 12/15/22 1840 12/17/22 2025  BILITOT <0.2 0.3 0.7  AST 24 25 20   ALT 21 21 18   ALKPHOS 62 49 49  PROT 6.8 6.5 6.8  ALBUMIN 4.4 3.4* 3.0*    Assessment and Plan:  Percutaneous gastric tube in place In use   Electronically Signed: Robet Leu, PA-C 01/11/2023, 1:30 PM   I spent a total of 15 Minutes at the the patient's bedside AND on the patient's hospital floor or unit, greater than 50% of which was counseling/coordinating care for perc G tube placement

## 2023-01-11 NOTE — Progress Notes (Signed)
Physical Therapy Treatment Patient Details Name: James Millay Sr. MRN: [id number] DOB: 09-14-1931 Today's Date: 01/11/2023   History of Present Illness Pt is an 89 y.o. male who presented 12/15/22 s/p fall with head trauma. CTH shows diffuse scattered traumatic pattern subarachnoid hemorrhage. Cortrak placed 8/12. Peg 01/10/2023. PMHx includes COPD, prior NSTEMI / CHF, NSVT, PAF, anemia, bradycardia, senile osteoporosis, CAD, HLD, HTN    PT Comments  Pt pleasant but confused and actively resists attempts at trying to progress functional mobility, likely due to anxiety. He required max-total assist x2 for bed mobility and transfers utilizing the stedy today. When transferring in the stedy he brought bil feet to the L side of the platform and his trunk to the R side of the stedy. He needed continual encouragement to remain calm. Will continue to follow acutely.    If plan is discharge home, recommend the following: Two people to help with walking and/or transfers;Two people to help with bathing/dressing/bathroom;Direct supervision/assist for financial management;Direct supervision/assist for medications management;Assistance with feeding;Help with stairs or ramp for entrance;Assist for transportation;Assistance with cooking/housework   Can travel by private vehicle     No  Equipment Recommendations  Other (comment) (TBD)    Recommendations for Other Services       Precautions / Restrictions Precautions Precautions: Fall;Other (comment) Precaution Comments: PEG; abdominal binder; bil mitts; combative intermittently; watch HR Restrictions Weight Bearing Restrictions: No     Mobility  Bed Mobility Overal bed mobility: Needs Assistance Bed Mobility: Supine to Sit     Supine to sit: HOB elevated, +2 for physical assistance, Total assist, +2 for safety/equipment     General bed mobility comments: Cues provided to bring legs off EOB but pt not initiating. Needed total assist x2 to bring  legs off bed and ascend trunk to sit up with pt actively resisting at trunk, likely due to abdominal pain    Transfers Overall transfer level: Needs assistance Equipment used: Ambulation equipment used Transfers: Sit to/from Stand, Bed to chair/wheelchair/BSC Sit to Stand: +2 safety/equipment, Max assist, Total assist, +2 physical assistance, Via lift equipment           General transfer comment: Gave pt ample time to try to initiate transfers on his own as pt often actively resists when given assistance, but pt not initiating and repeating to just "give me a minute". Pt ultimately required total assist x2 to stand in the stedy from elevated EOB the first rep, using a bed pad as a sling to extend his hips. maxAx2 to stand from stedy flaps to transfer to recliner. Transfer via Lift Equipment: Stedy  Ambulation/Gait               General Gait Details: unable at this time   Comptroller Bed    Modified Rankin (Stroke Patients Only)       Balance Overall balance assessment: Needs assistance Sitting-balance support: Feet supported, Bilateral upper extremity supported, No upper extremity supported Sitting balance-Leahy Scale: Poor Sitting balance - Comments: needed mod A in sitting most of the time due to posterior lean, moments of CGA when pt would lean anteriorly Postural control: Posterior lean Standing balance support: Bilateral upper extremity supported Standing balance-Leahy Scale: Poor Standing balance comment: Max-total assist to transfer to stand and mod-maxA to maintain balance depending on extent of posterior lean  Cognition Arousal: Alert Behavior During Therapy: Anxious Overall Cognitive Status: Impaired/Different from baseline Area of Impairment: Attention, Memory, Following commands, Safety/judgement, Awareness, Problem solving               Rancho Levels of Cognitive  Functioning [place]igos Scales of Cognitive Functioning: Confused, Inappropriate Non-Agitated: Maximal Assistance   Current Attention Level: Focused Memory: Decreased recall of precautions, Decreased short-term memory Following Commands: Follows one step commands inconsistently, Follows one step commands with increased time Safety/Judgement: Decreased awareness of safety, Decreased awareness of deficits Awareness: Intellectual Problem Solving: Slow processing, Decreased initiation, Difficulty sequencing, Requires verbal cues, Requires tactile cues General Comments: Pt alert throughout and anxious in regards to mobility, seemingly impacted by abdominal pain from PEG placement. Pt repeating "just give me a minute". Attempted to give pt ample time to respond to cues and initiate transfers, but pt repeating same statement again, ultimately needing extensive assistance to initiate and complete all mobility. Poor awareness of his safety   [email or web address] Scales of Cognitive Functioning: Confused, Inappropriate Non-Agitated: Maximal Assistance [V]    Exercises      General Comments General comments (skin integrity, edema, etc.): NT present and assisting throughout session      Pertinent Vitals/Pain Pain Assessment Pain Assessment: Faces Faces Pain Scale: Hurts even more Pain Location: assumed to be abdomen Pain Descriptors / Indicators: Discomfort, Grimacing, Moaning Pain Intervention(s): Monitored during session, Limited activity within patient's tolerance, Repositioned    Home Living                          Prior Function            PT Goals (current goals can now be found in the care plan section) Acute Rehab PT Goals Patient Stated Goal: family hopes for pt to improve PT Goal Formulation: With patient/family Time For Goal Achievement: 01/20/23 Potential to Achieve Goals: Fair Progress towards PT goals: Progressing toward goals (slowly)    Frequency     Min 1X/week      PT Plan      Co-evaluation              AM-PAC PT "6 Clicks" Mobility   Outcome Measure  Help needed turning from your back to your side while in a flat bed without using bedrails?: A Lot Help needed moving from lying on your back to sitting on the side of a flat bed without using bedrails?: Total Help needed moving to and from a bed to a chair (including a wheelchair)?: Total Help needed standing up from a chair using your arms (e.g., wheelchair or bedside chair)?: Total Help needed to walk in hospital room?: Total Help needed climbing 3-5 steps with a railing? : Total 6 Click Score: 7    End of Session Equipment Utilized During Treatment: Gait belt Activity Tolerance: Other (comment) (limited by anxiety) Patient left: with call bell/phone within reach;with family/visitor present;with restraints reapplied;in chair;with chair alarm set Nurse Communication: Mobility status;Other (comment) (NT) PT Visit Diagnosis: Other abnormalities of gait and mobility (R26.89);Muscle weakness (generalized) (M62.81);Other symptoms and signs involving the nervous system (R29.898);Pain;Unsteadiness on feet (R26.81);Difficulty in walking, not elsewhere classified (R26.2) Pain - part of body:  (generalized)     Time: 1345-1420 PT Time Calculation (min) (ACUTE ONLY): 35 min  Charges:    $Therapeutic Activity: 23-37 mins PT General Charges $$ ACUTE PT VISIT: 1 Visit  Virgil Benedict, PT, DPT Acute Rehabilitation Services  Office: [phone]    Bettina Gavia 01/11/2023, 4:49 PM

## 2023-01-11 NOTE — TOC Progression Note (Signed)
Transition of Care (TOC) - Progression Note    Patient Details  Name: [name]. MRN: [id number] Date of Birth: [birth year]  Transition of Care [facility]) CM/SW Contact  Eduard Roux, [place] Phone Number: 01/11/2023, 12:59 PM  Clinical Narrative:     Knute Neu with patient's daughter- updated on SNF beds offers.  Informed will follow up w/ [facility] but encouraged family to select one of the facilities that has offered as a back up plan.  Antony Blackbird, MSW, LCSW Clinical Social Worker    Expected Discharge Plan: Skilled Nursing Facility Barriers to Discharge: Continued Medical Work up  Expected Discharge Plan and Services In-house Referral: Clinical Social Work                                             Social Determinants of Health (SDOH) Interventions SDOH Screenings   Food Insecurity: No Food Insecurity (12/16/2022)  Housing: Low Risk  (12/16/2022)  Transportation Needs: No Transportation Needs (12/16/2022)  Utilities: Not At Risk (12/16/2022)  Alcohol Screen: Low Risk  (07/08/2022)  Depression (PHQ2-9): Low Risk  (07/26/2022)  Financial Resource Strain: Low Risk  (03/30/2022)  Physical Activity: Insufficiently Active (01/29/2021)  Social Connections: Socially Integrated (01/29/2021)  Stress: No Stress Concern Present (01/29/2021)  Tobacco Use: Medium Risk (01/05/2023)    Readmission Risk Interventions     No data to display

## 2023-01-12 ENCOUNTER — Encounter (HOSPITAL_COMMUNITY): Payer: Self-pay | Admitting: Urology

## 2023-01-12 DIAGNOSIS — I609 Nontraumatic subarachnoid hemorrhage, unspecified: Secondary | ICD-10-CM | POA: Diagnosis not present

## 2023-01-12 HISTORY — PX: PR INSJ TEMP NDWELLG BLADDER CATHETER COMPLICATED: 51703

## 2023-01-12 LAB — BASIC METABOLIC PANEL
Anion gap: 10 (ref 5–15)
BUN: 23 mg/dL (ref 8–23)
CO2: 24 mmol/L (ref 22–32)
Calcium: 8.4 mg/dL — ABNORMAL LOW (ref 8.9–10.3)
Chloride: 100 mmol/L (ref 98–111)
Creatinine, Ser: 0.65 mg/dL (ref 0.61–1.24)
GFR, Estimated: >60 mL/min (ref >60)
Glucose, Bld: 117 mg/dL — ABNORMAL HIGH (ref 70–99)
Potassium: 4.9 mmol/L (ref 3.5–5.1)
Sodium: 134 mmol/L — ABNORMAL LOW (ref 135–145)

## 2023-01-12 LAB — CBC
HCT: 33.5 % — ABNORMAL LOW (ref 39.0–52.0)
Hemoglobin: 10.9 g/dL — ABNORMAL LOW (ref 13.0–17.0)
MCH: 31.7 pg (ref 26.0–34.0)
MCHC: 32.5 g/dL (ref 30.0–36.0)
MCV: 97.4 fL (ref 80.0–100.0)
Platelets: 310 10*3/uL (ref 150–400)
RBC: 3.44 MIL/uL — ABNORMAL LOW (ref 4.22–5.81)
RDW: 12.9 % (ref 11.5–15.5)
WBC: 11.1 10*3/uL — ABNORMAL HIGH (ref 4.0–10.5)
nRBC: 0 % (ref 0.0–0.2)

## 2023-01-12 LAB — GLUCOSE, CAPILLARY
Glucose-Capillary: 142 mg/dL — ABNORMAL HIGH (ref 70–99)
Glucose-Capillary: 154 mg/dL — ABNORMAL HIGH (ref 70–99)
Glucose-Capillary: 127 mg/dL — ABNORMAL HIGH (ref 70–99)
Glucose-Capillary: 108 mg/dL — ABNORMAL HIGH (ref 70–99)
Glucose-Capillary: 128 mg/dL — ABNORMAL HIGH (ref 70–99)
Glucose-Capillary: 123 mg/dL — ABNORMAL HIGH (ref 70–99)

## 2023-01-12 NOTE — TOC Progression Note (Signed)
Transition of Care (TOC) - Progression Note    Patient Details  Name: [name]. MRN: [id number] Date of Birth: Apr 03, 1932  Transition of Care [facility]) CM/SW Contact  Eduard Roux, [place] Phone Number: 01/12/2023, 11:20 AM  Clinical Narrative:     CSW met with patient's daughter- she will review bed offers again & informed CSW of SNF choice.   Patient currently with mittens- he will ned to be out of restraints (mittens) 24 hrs before he can d/c to SNF.    Antony Blackbird, MSW, LCSW Clinical Social Worker    Expected Discharge Plan: Skilled Nursing Facility Barriers to Discharge: Continued Medical Work up  Expected Discharge Plan and Services In-house Referral: Clinical Social Work                                             Social Determinants of Health (SDOH) Interventions SDOH Screenings   Food Insecurity: No Food Insecurity (12/16/2022)  Housing: Low Risk  (12/16/2022)  Transportation Needs: No Transportation Needs (12/16/2022)  Utilities: Not At Risk (12/16/2022)  Alcohol Screen: Low Risk  (07/08/2022)  Depression (PHQ2-9): Low Risk  (07/26/2022)  Financial Resource Strain: Low Risk  (03/30/2022)  Physical Activity: Insufficiently Active (01/29/2021)  Social Connections: Socially Integrated (01/29/2021)  Stress: No Stress Concern Present (01/29/2021)  Tobacco Use: Medium Risk (01/05/2023)    Readmission Risk Interventions     No data to display

## 2023-01-12 NOTE — Progress Notes (Signed)
PROGRESS NOTE    James Rupe Sr.  TKZ:[id number] DOB: Sep 03, 1931 DOA: 12/15/2022 PCP: Blane Ohara, MD   Brief Narrative: 89 year old with past medical history significant for A-fib, known SVT, ischemic cardiomyopathy (non-STEMI status post LAD stent 2022) heart failure reduced ejection fraction (left ventricular ejection fraction improved to 55 on 2022 from 35%), hypertension, COPD, hypothyroidism, chronic anemia, frequent falls presents AT/8 after a fall found to have a traumatic subarachnoid hemorrhage.  Hospitalization has been complicated by E. coli UTI, asymptomatic runs of A-fib and PVC for which cardiology was consulted, acute urinary retention for which urology was consulted, placed urinary catheter 8/12, dysphagia for which core track has been placed and persistent encephalopathy.     Assessment & Plan:   Principal Problem:   Subarachnoid hemorrhage (HCC) Active Problems:   Frequent falls   Acute metabolic encephalopathy   Frequent PVCs   PAF (paroxysmal atrial fibrillation) (HCC)   Chronic HFrEF with improved EF(heart failure with reduced ejection fraction) (HCC)   Ischemic cardiomyopathy   SIRS (systemic inflammatory response syndrome) (HCC)   CAD S/P LAD stent 2022   History of recent fall   Essential hypertension   Acquired hypothyroidism   Mixed conductive and sensorineural hearing loss, bilateral   Moderate persistent asthma, uncomplicated   Anemia   At high risk for bleeding   Hypokalemia   Protein-calorie malnutrition, severe   Urinary retention  1-Traumatic subarachnoid hemorrhage/subdural hematoma: -CT 8/13 mentioned vasogenic edema and expected redistribution of contusions.  Repeat CT scan 8/26 shows improvement in size of SDH. Still encephalopathic.  Had peg tube placed 9/03. Tolerating tube feeding.   Recurrent UTI:  Fever, persistent leukocytosis Recurrent fevers and UTI during this hospitalization Recently completed 7 days of ceftriaxone  for E. coli UTI Fever 2 days ago and restarted on IV ceftriaxone. Urine culture growing enterobacter and E coli. Started on Bactrim. Will need 7-10 day Tx Foley catheter exchange 9/05.  TBI with acute encephalopathy Alert, aphasic. No significant improvement during this admission.  B 12 500, normal.  Thiamine level pending.  Started  thiamine.   Protein caloric malnutrition/dysphagia Family agree with PEG tube placement. Started on tube feeding.   PEG tube placement 9/3; tolerating tube feeding.   E. coli UTI: completed ceftriaxone previously.   Acute urinary retention, BPH, prostate cancer status post brachytherapy Neurology was consulted Dr. Benita Gutter over a wire.  He will need catheter until he is more mobile. Will likely need to go to SNF with catheter Needs to follow up with urology for Voiding trial Foley catheter exchange 9/05/. He will need foley exchange every 30 days.   PAF w/RVR, frequent PVCs, hx NSVT Heart rate is better.  Continue metoprolol.   Holding full dose anticoagulation due to fall risk and current SAH, SDH. Not a good candidate for Watchman device.   CAD s/p LAD stent 2022 Elevated troponin is mild at 32, flat, without angina consistent with demand myocardial ischemia.  Continue beta blocker, statin. Not on antiplatelet due to bleeding.    Chronic HFrEF with improved EF (35>55%, 2022): Continue metoprolol.   Remains euvolemic.   Hypokalemia Resolved.   Hyponatremia:  Stable.  Continue to monitor.   Hypophosphatemia:  Resolved    Chronic normocytic anemia:     Moderate persistent asthma, uncomplicated No exacerbation. - Continue DuoNebs.   Acquired hypothyroidism TSH is 6.177, free T4 is normal.  - Continue levothyroxine 175 mcg daily   Essential hypertension Continue with metoprolol/    Debility, deconditioning, frailty -  needs rehab.    Constipation -bowel regimen PRN      Nutrition Problem: Severe Malnutrition Etiology:  chronic illness (COPD, HFrEF)    Signs/Symptoms: severe fat depletion, severe muscle depletion    Interventions: Tube feeding, Refer to RD note for recommendations  Estimated body mass index is 16.18 kg/m as calculated from the following:   Height as of this encounter: 6' (1.829 m).   Weight as of this encounter: 54.1 kg.   DVT prophylaxis: heparin  Code Status: Full code Family Communication: care discussed with daughter at bedside. .  Disposition Plan:  Status is: Inpatient Remains inpatient appropriate because:Discharge tomorrow.     Consultants:  Neurosurgery   Procedures:    Antimicrobials:    Subjective: He is alert, he said " Thank You. " He would say yes, to everything.  Per daughter he has been talking to her.   Objective: Vitals:   01/11/23 2314 01/12/23 0317 01/12/23 0757 01/12/23 1150  BP: (!) 119/92  (!) 146/82 (!) 120/99  Pulse:   95 79  Resp:   (!) 22 20  Temp: 98.5 F (36.9 C) 97.6 F (36.4 C) 98.5 F (36.9 C) 98.7 F (37.1 C)  TempSrc: Axillary Axillary Axillary Axillary  SpO2:   98% 97%  Weight:      Height:        Intake/Output Summary (Last 24 hours) at 01/12/2023 1343 Last data filed at 01/12/2023 0830 Gross per 24 hour  Intake 12.72 ml  Output 1350 ml  Net -1337.28 ml   Filed Weights   01/06/23 0500 01/09/23 0315 01/10/23 0253  Weight: 55.8 kg 54.3 kg 54.1 kg    Examination:  General exam: NAD Respiratory system: CTA Cardiovascular system: S 1. S 2 RRR Gastrointestinal system: BS present, soft, nt  Peg tube placed Central nervous system: Alert Extremities: no edema   Data Reviewed: I have personally reviewed following labs and imaging studies  CBC: Recent Labs  Lab 01/06/23 0447 01/07/23 0408 01/09/23 0430 01/10/23 0721 01/11/23 0528 01/12/23 1138  WBC 11.6* 12.3* 13.2* 10.8* 12.5* 11.1*  NEUTROABS 8.0*  --   --   --   --   --   HGB 11.2* 11.2* 10.9* 9.9* 11.3* 10.9*  HCT 34.7* 34.9* 34.3* 30.3* 35.3* 33.5*   MCV 95.3 97.5 97.2 94.7 95.4 97.4  PLT 407* 399 346 310 343 310   Basic Metabolic Panel: Recent Labs  Lab 01/07/23 0408 01/09/23 0430 01/10/23 0721 01/11/23 0528 01/12/23 1138  NA 134* 135 130* 133* 134*  K 4.2 5.3* 4.0 3.9 4.9  CL 100 105 100 101 100  CO2 23 24 21* 20* 24  GLUCOSE 130* 137* 99 121* 117*  BUN 24* 24* 18 18 23   CREATININE 0.67 0.72 0.60* 0.62 0.65  CALCIUM 8.9 8.5* 8.1* 8.5* 8.4*   GFR: Estimated Creatinine Clearance: 47.9 mL/min (by C-G formula based on SCr of 0.65 mg/dL). Liver Function Tests: No results for input(s): "AST", "ALT", "ALKPHOS", "BILITOT", "PROT", "ALBUMIN" in the last 168 hours. No results for input(s): "LIPASE", "AMYLASE" in the last 168 hours. No results for input(s): "AMMONIA" in the last 168 hours. Coagulation Profile: No results for input(s): "INR", "PROTIME" in the last 168 hours.  Cardiac Enzymes: No results for input(s): "CKTOTAL", "CKMB", "CKMBINDEX", "TROPONINI" in the last 168 hours. BNP (last 3 results) Recent Labs    06/30/22 1210  PROBNP 810*   HbA1C: No results for input(s): "HGBA1C" in the last 72 hours. CBG: Recent Labs  Lab 01/11/23  1958 01/11/23 2319 01/12/23 0323 01/12/23 0753 01/12/23 1148  GLUCAP 155* 127* 142* 154* 127*   Lipid Profile: No results for input(s): "CHOL", "HDL", "LDLCALC", "TRIG", "CHOLHDL", "LDLDIRECT" in the last 72 hours. Thyroid Function Tests: No results for input(s): "TSH", "T4TOTAL", "FREET4", "T3FREE", "THYROIDAB" in the last 72 hours. Anemia Panel: Recent Labs    01/11/23 0528  VITAMINB12 529   Sepsis Labs: Recent Labs  Lab 01/10/23 0721  PROCALCITON 0.13    Recent Results (from the past 240 hour(s))  Culture, Urine (Do not remove urinary catheter, catheter placed by urology or difficult to place)     Status: Abnormal   Collection Time: 01/09/23 11:15 AM   Specimen: Urine, Catheterized  Result Value Ref Range Status   Specimen Description URINE, CATHETERIZED  Final    Special Requests   Final    NONE Performed at [facility], [address] [place], [place] [address]    Culture (A)  Final    >=100,000 COLONIES/mL ESCHERICHIA COLI >=100,000 COLONIES/mL ENTEROBACTER CLOACAE    Report Status 01/11/2023 FINAL  Final   Organism ID, Bacteria ESCHERICHIA COLI (A)  Final   Organism ID, Bacteria ENTEROBACTER CLOACAE (A)  Final      Susceptibility   Enterobacter cloacae - MIC*    CEFEPIME 2 SENSITIVE Sensitive     CIPROFLOXACIN <=0.25 SENSITIVE Sensitive     GENTAMICIN <=1 SENSITIVE Sensitive     IMIPENEM <=0.25 SENSITIVE Sensitive     NITROFURANTOIN 64 INTERMEDIATE Intermediate     TRIMETH/SULFA <=20 SENSITIVE Sensitive     PIP/TAZO >=128 RESISTANT Resistant     * >=100,000 COLONIES/mL ENTEROBACTER CLOACAE   Escherichia coli - MIC*    AMPICILLIN >=32 RESISTANT Resistant     CEFAZOLIN <=4 SENSITIVE Sensitive     CEFEPIME <=0.12 SENSITIVE Sensitive     CEFTRIAXONE <=0.25 SENSITIVE Sensitive     CIPROFLOXACIN <=0.25 SENSITIVE Sensitive     GENTAMICIN <=1 SENSITIVE Sensitive     IMIPENEM <=0.25 SENSITIVE Sensitive     NITROFURANTOIN <=16 SENSITIVE Sensitive     TRIMETH/SULFA <=20 SENSITIVE Sensitive     AMPICILLIN/SULBACTAM 16 INTERMEDIATE Intermediate     PIP/TAZO <=4 SENSITIVE Sensitive     * >=100,000 COLONIES/mL ESCHERICHIA COLI         Radiology Studies: No results found.      Scheduled Meds:  Chlorhexidine Gluconate Cloth  6 each Topical Daily   docusate  100 mg Per Tube BID   famotidine  40 mg Oral Daily   feeding supplement (PROSource TF20)  60 mL Per Tube Daily   free water  100 mL Per Tube Q12H   heparin injection (subcutaneous)  5,000 Units Subcutaneous Q12H   levothyroxine  175 mcg Per Tube Q0600   loratadine  10 mg Per Tube Daily   metoprolol tartrate  25 mg Per Tube BID   neomycin-bacitracin-polymyxin   Topical Daily   mouth rinse  15 mL Mouth Rinse 4 times per day   sulfamethoxazole-trimethoprim  1 tablet Per  Tube Q12H   terbinafine  250 mg Per Tube Daily   thiamine  100 mg Per Tube Daily   Continuous Infusions:  sodium chloride 10 mL/hr at 01/10/23 1344   feeding supplement (OSMOLITE 1.2 CAL) 1,000 mL (01/11/23 2030)     LOS: 27 days    Time spent: 35 minutes    Dayveon Halley A Zoria Rawlinson, MD [facility]   If 7PM-7AM, please contact night-coverage [email or web address]  01/12/2023, 1:43 PM

## 2023-01-12 NOTE — Procedures (Addendum)
   Urology Procedure Note:   Urology contacted for over wire exchange.  Catheter originally placed approximately 30 days ago and unfortunately patient has not made it out of hospital.  Discussed with his daughter and caretaker as he is still encephalopathic.  Patient was prepped and draped in the usual sterile fashion.  Sensor wire was advanced through present [employer] and catheter offloaded from wire.  A fresh 58F council catheter was then advanced over wire to the level of the bladder with immediate return of clear yellow urine.  Wire was removed and retention balloon was inflated with 10 cc of sterile water.  Catheter was placed to drainage with no dependent loops.  This concluded the procedure.  This will need to be repeated in approximately 30 days.

## 2023-01-12 NOTE — Progress Notes (Signed)
Speech Language Pathology Treatment: Dysphagia;Cognitive-Linquistic  Patient Details Name: James Schroder Sr. MRN: [id number] DOB: [birth year] Today's Date: 01/12/2023 Time: 6295-2841 SLP Time Calculation (min) (ACUTE ONLY): 20 min  Assessment / Plan / Recommendation Clinical Impression  Pt alert throughout session today. SLP prompted pt to take part in cognitive tasks while facilitating ice chip trials. Pt stating "I don't know what that is" when presented ice. He made attempts to self-feed ice chips via spoon, although with seemingly reduced control and required assistance to ensure bolus received into his oral cavity. Pt dropped numerous pieces of ice with seemingly no awareness, continuing to lift an empty spoon to his lips. He was intermittently able to follow commands to cough/clear his throat, although suspect come noted attempts were in response to bolus sensation, rather than intentional command following. Provided education to his daughter regarding bringing in pictures of family and continuing to play familiar music to facilitate communication and increase stimulation. Suspect if pt can more consistently follow commands, he could initiate use of compensatory strategies for improved swallowing function, although note he is not currently demonstrating that ability. Recommend he remain NPO, although may continue to have ice chips intermittently with nursing staff to provide frequent cueing to initiate a throat clear/cough following each swallow. SLP will continue to f/u to address cognitive and swallowing goals.    HPI HPI: Pt is an 89 y.o. male who presented with AMS after a fall. CT head: Scattered areas of subarachnoid hemorrhage in the left occipital  lobe, bilateral high frontal lobes, and right temporal lobe. Small amount of subarachnoid hemorrhage posterior to the right midbrain extending into the cerebral aqueduct. CTH 8/26 with decreased size and density of previously seen multifocal SAH.  PMH: COPD, prior NSTEMI / CHF, NSVT, PAF, on ASA81.      SLP Plan  Continue with current plan of care      Recommendations for follow up therapy are one component of a multi-disciplinary discharge planning process, led by the attending physician.  Recommendations may be updated based on patient status, additional functional criteria and insurance authorization.    Recommendations  Diet recommendations: NPO Medication Administration: Via alternative means                  Oral care QID;Staff/trained caregiver to provide oral care;Oral care prior to ice chip/H20   Frequent or constant Supervision/Assistance Dysphagia, oropharyngeal phase (R13.12);Cognitive communication deficit (L24.401)     Continue with current plan of care     Gwynneth Aliment, M.A., CF-SLP Speech Language Pathology, Acute Rehabilitation Services  Secure Chat preferred [phone]   01/12/2023, 12:40 PM

## 2023-01-13 DIAGNOSIS — I609 Nontraumatic subarachnoid hemorrhage, unspecified: Secondary | ICD-10-CM | POA: Diagnosis not present

## 2023-01-13 LAB — GLUCOSE, CAPILLARY
Glucose-Capillary: 145 mg/dL — ABNORMAL HIGH (ref 70–99)
Glucose-Capillary: 131 mg/dL — ABNORMAL HIGH (ref 70–99)
Glucose-Capillary: 124 mg/dL — ABNORMAL HIGH (ref 70–99)
Glucose-Capillary: 140 mg/dL — ABNORMAL HIGH (ref 70–99)
Glucose-Capillary: 144 mg/dL — ABNORMAL HIGH (ref 70–99)
Glucose-Capillary: 126 mg/dL — ABNORMAL HIGH (ref 70–99)

## 2023-01-13 LAB — URINE CULTURE: Culture: <10000 — AB

## 2023-01-13 MED ORDER — FAMOTIDINE 40 MG/5ML PO SUSR: 40.0000 mg | Freq: Every day | ORAL | 0 refills | Status: DC

## 2023-01-13 MED ORDER — ATORVASTATIN CALCIUM 80 MG PO TABS: 40.0000 mg | ORAL_TABLET | Freq: Every morning | ORAL | 0 refills | Status: DC

## 2023-01-13 MED ORDER — OSMOLITE 1.2 CAL PO LIQD: 1000.0000 mL | ORAL | 0 refills | Status: DC

## 2023-01-13 MED ORDER — VITAMIN B-1 100 MG PO TABS: 100.0000 mg | ORAL_TABLET | Freq: Every day | ORAL | 0 refills | Status: DC

## 2023-01-13 MED ORDER — SULFAMETHOXAZOLE-TRIMETHOPRIM 800-160 MG PO TABS: 1.0000 | ORAL_TABLET | Freq: Two times a day (BID) | ORAL | 0 refills | Status: DC

## 2023-01-13 MED ORDER — PROSOURCE TF20 ENFIT COMPATIBL EN LIQD: 60.0000 mL | Freq: Every day | ENTERAL | 0 refills | Status: DC

## 2023-01-13 MED ORDER — METOPROLOL TARTRATE 25 MG PO TABS: 25.0000 mg | ORAL_TABLET | Freq: Two times a day (BID) | ORAL | 0 refills | Status: DC

## 2023-01-13 MED ORDER — FREE WATER: 100.0000 mL | Freq: Two times a day (BID) | 0 refills | Status: DC

## 2023-01-13 NOTE — Discharge Summary (Signed)
Physician Discharge Summary   Patient: James Ewton Sr. MRN: [id number] DOB: 06-21-1931  Admit date:     12/15/2022  Discharge date: 01/13/23  Discharge Physician: Alba Cory   PCP: Blane Ohara, MD   Recommendations at discharge:   Needs to follow up with Dr Ave Filter, neurosurgery in 1 week Needs to follow up with Urology Continue with intensive speech therapy, PT, OT>    Discharge Diagnoses: Principal Problem:   Subarachnoid hemorrhage (HCC) Active Problems:   Frequent falls   Acute metabolic encephalopathy   Frequent PVCs   PAF (paroxysmal atrial fibrillation) (HCC)   Chronic HFrEF with improved EF(heart failure with reduced ejection fraction) (HCC)   Ischemic cardiomyopathy   SIRS (systemic inflammatory response syndrome) (HCC)   CAD S/P LAD stent 2022   History of recent fall   Essential hypertension   Acquired hypothyroidism   Mixed conductive and sensorineural hearing loss, bilateral   Moderate persistent asthma, uncomplicated   Anemia   At high risk for bleeding   Hypokalemia   Protein-calorie malnutrition, severe   Urinary retention  Resolved Problems:   * No resolved hospital problems. *  Hospital Course: 89 year old with past medical history significant for A-fib, known SVT, ischemic cardiomyopathy (non-STEMI status post LAD stent 2022) heart failure reduced ejection fraction (left ventricular ejection fraction improved to 55 on 2022 from 35%), hypertension, COPD, hypothyroidism, chronic anemia, frequent falls presents AT/8 after a fall found to have a traumatic subarachnoid hemorrhage.   Hospitalization has been complicated by E. coli UTI, asymptomatic runs of A-fib and PVC for which cardiology was consulted, acute urinary retention for which urology was consulted, placed urinary catheter 8/12, dysphagia for which core track has been placed and persistent encephalopathy.  Assessment and Plan: 1-Traumatic subarachnoid hemorrhage/subdural  hematoma: -CT 8/13 mentioned vasogenic edema and expected redistribution of contusions.  Repeat CT scan 8/26 shows improvement in size of SDH. Still encephalopathic.  Had peg tube placed 9/03. Tolerating tube feeding.  Follow up with Neurosurgery in regard resumption of aspirin. Due to [facility] and falls.   Recurrent UTI:  Fever, persistent leukocytosis Recurrent fevers and UTI during this hospitalization Recently completed 7 days of ceftriaxone for E. coli UTI Fever 2 days ago and restarted on IV ceftriaxone. Urine culture growing enterobacter and E coli. Started on Bactrim. Will need 7-10 day Tx Foley catheter exchange 9/05. WBC normalized.   TBI with acute encephalopathy Alert, aphasic. No significant improvement during this admission.  B 12 500, normal.  Thiamine level pending.  Started  thiamine.    Protein caloric malnutrition/dysphagia Family agree with PEG tube placement. Started on tube feeding.    PEG tube placement 9/3; tolerating tube feeding.    E. coli UTI: completed ceftriaxone previously.    Acute urinary retention, BPH, prostate cancer status post brachytherapy Neurology was consulted Dr. Benita Gutter over a wire.  He will need catheter until he is more mobile. Will likely need to go to SNF with catheter Needs to follow up with urology for Voiding trial Foley catheter exchange 9/05/. He will need foley exchange every 30 days.    PAF w/RVR, frequent PVCs, hx NSVT Heart rate is better.  Continue metoprolol.   Holding full dose anticoagulation due to fall risk and current SAH, SDH. Not a good candidate for Watchman device.   CAD s/p LAD stent 2022 Elevated troponin is mild at 32, flat, without angina consistent with demand myocardial ischemia.  Continue beta blocker, statin. Not on antiplatelet due to bleeding.  Chronic HFrEF with improved EF (35>55%, 2022): Continue metoprolol.   Remains euvolemic.   Hypokalemia Resolved.   Hyponatremia:  Stable.  Continue to  monitor.   Hypophosphatemia:  Resolved    Chronic normocytic anemia:      Moderate persistent asthma, uncomplicated No exacerbation. - Continue DuoNebs.   Acquired hypothyroidism TSH is 6.177, free T4 is normal.  - Continue levothyroxine 175 mcg daily   Essential hypertension Continue with metoprolol/    Debility, deconditioning, frailty -needs rehab.    Constipation -bowel regimen PRN           Consultants: Neurosurgery  Procedures performed: peg tube placement.  Disposition: Skilled nursing facility Diet recommendation:  Discharge Diet Orders (From admission, onward)     Start     Ordered   01/13/23 0000  Diet - low sodium heart healthy        01/13/23 1024           Diet: NPO, on tube feeding.  DISCHARGE MEDICATION: Allergies as of 01/13/2023       Reactions   Clarithromycin    REACTION: GI upset        Medication List     STOP taking these medications    aspirin EC 81 MG tablet   BOOST NUTRITIONAL ENERGY PO Replaced by: feeding supplement (OSMOLITE 1.2 CAL) Liqd   metoprolol succinate 50 MG 24 hr tablet Commonly known as: TOPROL-XL       TAKE these medications    albuterol 108 (90 Base) MCG/ACT inhaler Commonly known as: VENTOLIN HFA Inhale 2 puffs into the lungs every 6 (six) hours as needed for wheezing or shortness of breath.   atorvastatin 80 MG tablet Commonly known as: LIPITOR Take 0.5 tablets (40 mg total) by mouth every morning. What changed:  how much to take when to take this   famotidine 40 MG/5ML suspension Commonly known as: PEPCID Take 5 mLs (40 mg total) by mouth daily. Start taking on: January 14, 2023   feeding supplement (OSMOLITE 1.2 CAL) Liqd Place 1,000 mLs into feeding tube continuous. Replaces: BOOST NUTRITIONAL ENERGY PO   feeding supplement (PROSource TF20) liquid Place 60 mLs into feeding tube daily. Start taking on: January 14, 2023   ferrous sulfate 325 (65 FE) MG tablet Commonly known  as: FerrouSul Take 1 tablet (325 mg total) by mouth daily with breakfast.   fexofenadine 180 MG tablet Commonly known as: ALLEGRA Take 180 mg by mouth daily.   free water Soln Place 100 mLs into feeding tube every 12 (twelve) hours.   ipratropium-albuterol 0.5-2.5 (3) MG/3ML Soln Commonly known as: DUONEB Take 3 mLs by nebulization every 4 (four) hours as needed (shortness of breath or wheezing).   levothyroxine 175 MCG tablet Commonly known as: SYNTHROID TAKE ONE TABLET BY MOUTH BEFORE BREAKFAST   metoprolol tartrate 25 MG tablet Commonly known as: LOPRESSOR Place 1 tablet (25 mg total) into feeding tube 2 (two) times daily.   MULTIVITAMIN ADULT PO Take 1 tablet by mouth daily. Unknown strength   sulfamethoxazole-trimethoprim 800-160 MG tablet Commonly known as: BACTRIM DS Place 1 tablet into feeding tube every 12 (twelve) hours for 7 days.   terbinafine 250 MG tablet Commonly known as: LAMISIL Take 1 tablet (250 mg total) by mouth daily.   thiamine 100 MG tablet Commonly known as: Vitamin B-1 Place 1 tablet (100 mg total) into feeding tube daily. Start taking on: January 14, 2023        Discharge Exam: Ceasar Mons Weights  01/06/23 0500 01/09/23 0315 01/10/23 0253  Weight: 55.8 kg 54.3 kg 54.1 kg   General; NAD  Condition at discharge: stable  The results of significant diagnostics from this hospitalization (including imaging, microbiology, ancillary and laboratory) are listed below for reference.   Imaging Studies: IR GASTROSTOMY TUBE MOD SED  Result Date: 01/10/2023 INDICATION: 89 year old male with history of stroke and failure to thrive presenting for percutaneous gastrostomy tube placement. EXAM: PERC PLACEMENT GASTROSTOMY MEDICATIONS: The patient was currently receiving intravenous antibiotics as an inpatient. ANESTHESIA/SEDATION: Versed 1 mg IV; Fentanyl 50 mcg IV Moderate Sedation Time:  12 The patient was continuously monitored during the procedure by the  interventional radiology nurse under my direct supervision. CONTRAST:  10mL OMNIPAQUE IOHEXOL 300 MG/ML SOLN - administered into the gastric lumen. FLUOROSCOPY TIME:  One mGy COMPLICATIONS: None immediate. PROCEDURE: Informed written consent was obtained from the patient after a thorough discussion of the procedural risks, benefits and alternatives. All questions were addressed. Maximal Sterile barrier Technique was utilized including caps, mask, sterile gowns, sterile gloves, sterile drape, hand hygiene and skin antiseptic. A timeout was performed prior to the initiation of the procedure. The patient was placed on the procedure table in the supine position. Pre-procedure abdominal film confirmed visualization of the transverse colon. The patient was prepped and draped in usual sterile fashion. The stomach was insufflated with air via the indwelling nasogastric tube. Under fluoroscopy, a puncture site was selected and local analgesia achieved with 1% lidocaine infiltrated subcutaneously. Under fluoroscopic guidance, a gastropexy needle was passed into the stomach and the T-bar suture was released. Entry into the stomach was confirmed with fluoroscopy, aspiration of air, and injection of contrast material. This was repeated with an additional gastropexy suture (for a total of 2 fasteners). At the center of these gastropexy sutures, a dermatotomy was performed. An 18 gauge needle was passed into the stomach at the site of this dermatotomy, and position within the gastric lumen again confirmed under fluoroscopy using aspiration of air and contrast injection. An Amplatz guidewire was passed through this needle and intraluminal placement within the stomach was confirmed by fluoroscopy. The needle was removed. Over the guidewire, the percutaneous tract was dilated using a 10 mm non-compliant balloon. The balloon was deflated, then pushed into the gastric lumen followed in concert by the 20 Fr gastrostomy tube. The  retention balloon of the percutaneous gastrostomy tube was inflated with 20 mL of sterile water. The tube was withdrawn until the retention balloon was at the edge of the gastric lumen. The external bumper was brought to the abdominal wall. Contrast was injected through the gastrostomy tube, confirming intraluminal positioning. The patient tolerated the procedure well without any immediate post-procedural complications. IMPRESSION: Technically successful placement of 20 Fr gastrostomy tube. Marliss Coots, MD [facility] Radiology Electronically Signed   By: Marliss Coots M.D.   On: 01/10/2023 09:41   DG CHEST PORT 1 VIEW  Result Date: 01/09/2023 CLINICAL DATA:  Fever EXAM: PORTABLE CHEST 1 VIEW COMPARISON:  01/01/2023 FINDINGS: Enteric tube extends into the stomach, beyond the inferior margin of the film. Stable heart size. Aortic atherosclerosis. Coronary artery stent. No focal airspace consolidation, pleural effusion, or pneumothorax. IMPRESSION: No acute cardiopulmonary findings Electronically Signed   By: Duanne Guess D.O.   On: 01/09/2023 13:09   DG Abd Portable 1V  Result Date: 01/06/2023 CLINICAL DATA:  Encounter for feeding tube EXAM: PORTABLE ABDOMEN - 1 VIEW COMPARISON:  Abdominal CT from 2 days ago FINDINGS:  Feeding tube with tip at the distal stomach. James contrast is seen within nondilated colon. Clear lung bases.  Coronary stenting. IMPRESSION: Feeding tube with tip at the distal stomach. Electronically Signed   By: Tiburcio Pea M.D.   On: 01/06/2023 12:52   CT ABDOMEN WO CONTRAST  Result Date: 01/05/2023 CLINICAL DATA:  dysphagia.  Preop planning for gastrostomy. EXAM: CT ABDOMEN WITHOUT CONTRAST TECHNIQUE: Multidetector CT imaging of the abdomen was performed following the standard protocol without IV contrast. RADIATION DOSE REDUCTION: This exam was performed according to the departmental dose-optimization program which includes  automated exposure control, adjustment of the mA and/or kV according to patient size and/or use of iterative reconstruction technique. COMPARISON:  Swallow eval, 01/04/2023 and 12/28/2022. Chest XR, 01/01/2023. FINDINGS: Lower chest: 4.5 cm dilatation of the imaged ascending thoracic aorta. LAD stent. Severe burden of multivessel coronary atherosclerosis is present. Hepatobiliary: Normal noncontrast appearance of the liver without focal abnormality. Trace radiodense gallstones within the nondistended gallbladder. No gallbladder wall thickening, or biliary dilatation. Pancreas: No pancreatic ductal dilatation or surrounding inflammatory changes. Spleen: Normal in size without focal abnormality. Adrenals/Urinary Tract: Adrenal glands are unremarkable. Kidneys are normal, without renal calculi, focal lesion, or hydronephrosis. Stomach/Bowel: Stomach is nondistended and within normal limits. Small bore enteric feeding tube, with tip projecting at the proximal duodenum. Intraluminal contrast opacification of the colon, previously ingested. Imaged portions of the small bowel and colon are nondistended. No evidence of bowel wall thickening, distention, or inflammatory changes. 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 Moderate-to-severe burden of aortic atherosclerosis without aneurysmal dilatation. No enlarged abdominal lymph nodes. Other: Scaphoid abdomen.  No intra-abdominal ascites Musculoskeletal: Degenerative changes spine, including mild S-shaped thoracolumbar curvature. No acute osseous findings. IMPRESSION: 1. No acute abdominal process. 2. No intervening viscera. Anatomy is amenable for percutaneous gastrostomy placement. Final decision making is reserved for the performing physician. 3. Incidental 4.5 cm ascending thoracic aortic dilatation, incompletely assessed. Ascending thoracic aortic aneurysm. Recommend semi-annual imaging followup by CTA or MRA and referral to cardiothoracic  surgery if not already obtained This recommendation follows 2010 ACCF/AHA/AATS/ACR/ASA/SCA/SCAI/SIR/STS/SVM Guidelines for the Diagnosis and Management of Patients With Thoracic Aortic Disease. Circulation. 2010; 121: G644-I347. Aortic aneurysm NOS (ICD10-I71.9) 4. Aortic Atherosclerosis (ICD10-I70.0). Additional incidental, chronic and senescent findings as above. Roanna Banning, MD [facility] Radiology Electronically Signed   By: Roanna Banning M.D.   On: 01/05/2023 12:03   DG Swallowing Func-Speech Pathology  Result Date: 01/04/2023 Table formatting from the original result was not included. Modified Barium Swallow Study Patient Details Name: James Enriguez Sr. MRN: [id number] Date of Birth: 01/03/1932 Today's Date: 01/04/2023 HPI/PMH: HPI: Pt is an 89 y.o. male who presented with AMS after a fall. CT head: Scattered areas of subarachnoid hemorrhage in the left occipital  lobe, bilateral high frontal lobes, and right temporal lobe. Small amount of subarachnoid hemorrhage posterior to the right midbrain extending into the cerebral aqueduct. CTH 8/26 with decreased size and density of previously seen multifocal SAH. PMH: COPD, prior NSTEMI / CHF, NSVT, PAF, on ASA81. Clinical Impression: Clinical Impression: Pt presents with a moderate oropharyngeal dysphagia characterized by deficits related to awareness and reduced strength. Feel that this is in part due to cognition, although also significantly affected by anatomical differences. Overall, pt with poor awareness of bolus presentations, leaving mouth wide open when presented with a spoon, straw, or cup and requiring Max cueing to close mouth and initiate. With larger boluses, there is posterior progression to  the level of the pyriform sinuses before he initiates the swallow. He is consistently piecemeal swallowing which affects his overall coordination. Pt presents with reduced pharyngeal strength and suspected  prominent cervical osteophytes create pharyngeal narrowing at the level of the valleculae, contributing to no noted epiglottic inversion. This may also be affected by Cortrak in place, although feel this is not an acute difference. Incomplete epiglottic inversion allows the laryngeal vestibule to be open during the swallowing contributing to penetration of all consistencies during the swallow as well as after. With thicker consistencies, pt has increased pharyngeal residue collecting along the base of tongue, valleculae, pharyngeal wall, and aryepiglottic folds. Due to pt's reduced sensation and strength, this residue progresses into the laryngeal vestibule when pt is at rest. Observed penetration to the level of the vocal folds without sensation and without ability to clear after a cued cough (PAS 5) with thin liquids, nectar thick liquids, and honey thick liquids. Pureed textures were penetrated above the vocal folds and were not able to be expelled (PAS 3) with concern for continual progression through the trachea without sensation. Overall, feel that pt's cognition does not allow for consistent use of compensatory strategies at this time. Recommend he remain strictly NPO. Will continue to follow. Factors that may increase risk of adverse event in presence of aspiration Rubye Oaks & Clearance Coots 2021): Factors that may increase risk of adverse event in presence of aspiration Rubye Oaks & Clearance Coots 2021): Poor general health and/or compromised immunity; Reduced cognitive function; Frail or deconditioned; Dependence for feeding and/or James hygiene; Inadequate James hygiene Recommendations/Plan: Swallowing Evaluation Recommendations Swallowing Evaluation Recommendations Recommendations: NPO Medication Administration: Via alternative means Treatment Plan Treatment Plan Treatment recommendations: Therapy as outlined in treatment plan below Follow-up recommendations: Skilled nursing-short term rehab (<3 hours/day) Functional status  assessment: Patient has had a recent decline in their functional status and demonstrates the ability to make significant improvements in function in a reasonable and predictable amount of time. Treatment frequency: Min 2x/week Treatment duration: 2 weeks Interventions: Aspiration precaution training; Trials of upgraded texture/liquids; Diet toleration management by SLP; Patient/family education Recommendations Recommendations for follow up therapy are one component of a multi-disciplinary discharge planning process, led by the attending physician.  Recommendations may be updated based on patient status, additional functional criteria and insurance authorization. Assessment: Orofacial Exam: Orofacial Exam James Cavity: James Hygiene: WFL James Cavity - Dentition: Poor condition; Missing dentition; Dentures, top; Dentures, not available Orofacial Anatomy: WFL James Motor/Sensory Function: WFL Anatomy: Anatomy: Suspected cervical osteophytes Boluses Administered: Boluses Administered Boluses Administered: Thin liquids (Level 0); Mildly thick liquids (Level 2, nectar thick); Moderately thick liquids (Level 3, honey thick); Puree  James Impairment Domain: James Impairment Domain Lip Closure: Escape from interlabial space or lateral juncture, no extension beyond vermillion border Tongue control during bolus hold: Posterior escape of greater than half of bolus Bolus transport/lingual motion: Delayed initiation of tongue motion (James holding) James residue: Residue collection on James structures Location of James residue : Tongue; Palate Initiation of pharyngeal swallow : Pyriform sinuses  Pharyngeal Impairment Domain: Pharyngeal Impairment Domain Soft palate elevation: No bolus between soft palate (SP)/pharyngeal wall (PW) Laryngeal elevation: Complete superior movement of thyroid cartilage with complete approximation of arytenoids to epiglottic petiole Anterior hyoid excursion: Partial anterior movement Epiglottic movement: No  inversion Laryngeal vestibule closure: Incomplete, narrow column air/contrast in laryngeal vestibule Pharyngeal stripping wave : Present - diminished Pharyngeal contraction (A/P view only): N/A Pharyngoesophageal segment opening: Minimal distention/minimal duration, marked obstruction of flow Tongue base retraction: Narrow  column of contrast or air between tongue base and PPW Pharyngeal residue: Majority of contrast within or on pharyngeal structures Location of pharyngeal residue: Valleculae; Pharyngeal wall; Pyriform sinuses; Aryepiglottic folds  Esophageal Impairment Domain: No data recorded Pill: No data recorded Penetration/Aspiration Scale Score: Penetration/Aspiration Scale Score 3.  Material enters airway, remains ABOVE vocal cords and not ejected out: Puree 5.  Material enters airway, CONTACTS cords and not ejected out: Thin liquids (Level 0); Mildly thick liquids (Level 2, nectar thick); Moderately thick liquids (Level 3, honey thick) Compensatory Strategies: Compensatory Strategies Compensatory strategies: Yes Straw: Ineffective Ineffective Straw: Thin liquid (Level 0)   General Information: Caregiver present: No  Diet Prior to this Study: NPO; Cortrak/Small bore NG tube   Temperature : Normal   Respiratory Status: WFL   Supplemental O2: None (Room air)   History of Recent Intubation: No  Behavior/Cognition: Alert; Cooperative; Confused; Requires cueing Self-Feeding Abilities: Dependent for feeding Baseline vocal quality/speech: Dysphonic; Hypophonia/low volume Volitional Cough: Able to elicit Volitional Swallow: Unable to elicit Exam Limitations: Excessive movement Goal Planning: Prognosis for improved oropharyngeal function: Fair Barriers to Reach Goals: Cognitive deficits; Severity of deficits; Time post onset; Overall medical prognosis No data recorded Patient/Family Stated Goal: none stated Consulted and agree with results and recommendations: Pt unable/family or caregiver not available Pain: Pain  Assessment Pain Assessment: No/denies pain Breathing: 0 Negative Vocalization: 0 Facial Expression: 0 Body Language: 0 Consolability: 0 PAINAD Score: 0 End of Session: Start Time:SLP Start Time (ACUTE ONLY): 1121 Stop Time: SLP Stop Time (ACUTE ONLY): 1138 Time Calculation:SLP Time Calculation (min) (ACUTE ONLY): 17 min Charges: SLP Evaluations $ SLP Speech Visit: 1 Visit SLP Evaluations $MBS Swallow: 1 Procedure $Swallowing Treatment: 1 Procedure $Speech Treatment for Individual: 1 Procedure SLP visit diagnosis: SLP Visit Diagnosis: Dysphagia, oropharyngeal phase (R13.12); Cognitive communication deficit (R41.841) Past Medical History: Past Medical History: Diagnosis Date  Acquired hypothyroidism 08/15/2019  Acute systolic heart failure (HCC) 06/25/2020  Anemia 02/05/2020  boarderline anemic  Atopic dermatitis 12/05/2019  Bacterial pneumonia, unspecified 06/14/2012  06/14/2012 CXR w/ RML PNA >Zithromax and Rocephin    Bradycardia 11/28/2018  CAD (coronary artery disease) 07/07/2020  Cancer (HCC)   prostate cancer-seed implant  Closed fracture dislocation of right elbow   Closed fracture of right olecranon process 04/10/2020  COPD (chronic obstructive pulmonary disease) (HCC) 01/21/2020  Dermatitis 02/05/2020  Dizziness 02/19/2016  Essential hypertension 11/28/2018  History of prostate cancer   seed implant  Hyperlipemia   Hyperlipidemia, mixed 08/21/2008  Qualifier: Diagnosis of  By: Maple Hudson MD, Clinton D   LVH (left ventricular hypertrophy) 11/28/2018  Malnutrition of moderate degree (HCC) 08/15/2019  Mixed conductive and sensorineural hearing loss, 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  Moderate persistent asthma, uncomplicated 01/21/2020  Nonsustained ventricular tachycardia (HCC) 01/30/2019  NSTEMI (non-ST elevated myocardial infarction) (HCC) 06/21/2020  Other fatigue 02/05/2020  PAF (paroxysmal atrial fibrillation) (HCC) 06/25/2020  Seasonal and perennial allergic rhinitis 05/17/2007     Senile osteoporosis 08/15/2019  SINUSITIS, ACUTE 06/28/2007  Qualifier:  Diagnosis of  By: Clent Ridges NP, Tammy    Ventricular ectopy 11/28/2018 Past Surgical History: Past Surgical History: Procedure Laterality Date  APPENDECTOMY    CORONARY ATHERECTOMY N/A 06/24/2020  Procedure: CORONARY ATHERECTOMY;  Surgeon: Iran Ouch, MD;  Location: [facility];  Service: Cardiovascular;  Laterality: N/A;  CORONARY ULTRASOUND/IVUS N/A 06/24/2020  Procedure: Intravascular Ultrasound/IVUS;  Surgeon: Iran Ouch, MD;  Location: [facility];  Service: Cardiovascular;  Laterality: N/A;  LEFT HEART CATH AND CORONARY ANGIOGRAPHY N/A 06/22/2020  Procedure: LEFT HEART CATH AND CORONARY ANGIOGRAPHY;  Surgeon: Yvonne Kendall, MD;  Location: [facility];  Service: Cardiovascular;  Laterality: N/A;  ORIF ELBOW FRACTURE Right 04/10/2020  Procedure: OPEN REDUCTION INTERNAL FIXATION (ORIF) ELBOW/OLECRANON FRACTURE;  Surgeon: Teryl Lucy, MD;  Location: [facility];  Service: Orthopedics;  Laterality: Right; Gwynneth Aliment, M.A., CF-SLP Speech Language Pathology, Acute Rehabilitation Services Secure Chat preferred [phone] 01/04/2023, 1:45 PM  CT HEAD WO CONTRAST ( )  Result Date: 01/02/2023 CLINICAL DATA:  Mental status change, persistent or worsening CT EXAM: CT HEAD WITHOUT CONTRAST TECHNIQUE: Contiguous axial images were obtained from the base of the skull through the vertex without intravenous contrast. RADIATION DOSE REDUCTION: This exam was performed according to the departmental dose-optimization program which includes automated exposure control, adjustment of the mA and/or kV according to patient size and/or use of iterative reconstruction technique. COMPARISON:  Head 12/20/2022. FINDINGS: Brain: Decreased size and density of previously seen subarachnoid hemorrhage with a few areas of persistent small hyperdensity. Intermediate density right subdural hemorrhage is decreased in thickness, now 7 mm on the right and 3 mm on the left (previously 9 and 11 mm).  No substantial midline shift. No evidence of acute large vascular territory infarct, mass lesion or hydrocephalus. Cerebral atrophy and chronic microvascular ischemic change. Basal ganglia and cerebellar calcifications. Vascular: No hyperdense vessel identified. Calcific atherosclerosis. Skull: No acute fracture. Sinuses/Orbits: Clear sinuses.  No acute orbital findings. Other: No mastoid effusions. IMPRESSION: 1. Intermediate density bilateral subdural hemorrhage is decreased in thickness, now 7 mm on the right and 3 mm on the left (previously 9 and 11 mm when measured on that study). 2. Decreased size and density of previously seen multifocal subarachnoid hemorrhage. Electronically Signed   By: Feliberto Harts M.D.   On: 01/02/2023 09:27   DG CHEST PORT 1 VIEW  Result Date: 01/01/2023 CLINICAL DATA:  Cough EXAM: PORTABLE CHEST 1 VIEW COMPARISON:  12/26/2022 FINDINGS: Atherosclerotic calcification of the aortic arch. Coronary artery stent noted. Heart size within normal limits. The lungs appear clear.  No blunting of the costophrenic angles. Severe degenerative glenohumeral arthropathy on the right with elimination of the acromial humeral distance suggesting chronic right rotator cuff tear. A feeding tube extends into the stomach and beyond the inferior margin of today's image. IMPRESSION: 1. No acute findings. 2. Feeding tube extends into the stomach and beyond the inferior margin of today's image. 3. Severe degenerative glenohumeral arthropathy on the right with chronic right rotator cuff tear. Electronically Signed   By: Gaylyn Rong M.D.   On: 01/01/2023 13:21   DG Swallowing Func-Speech Pathology  Result Date: 12/28/2022 Blaine Hamper     12/28/2022  5:38 PM Modified Barium Swallow Study Patient Details Name: James Cavness Sr. MRN: [id number] Date of Birth: [birth year] Today's Date: 12/28/2022 HPI/PMH: HPI: Pt is an 89 y.o. male who presented with AMS after a fall. CT head: Scattered  areas of subarachnoid hemorrhage in the left occipital  lobe, bilateral high frontal lobes, and right temporal lobe. Small amount of subarachnoid hemorrhage posterior to the right midbrain extending into the cerebral aqueduct. PMH: COPD, prior NSTEMI / CHF, NSVT, PAF, on ASA81. Clinical Impression: Clinical Impression: Patient presents with a mild-moderate James dysphagia and a moderately impaired pharyngeal phase dysphagia as per this MBS. SLP suspects that patient has a chronic dysphagia that is exacerbated by his current cognitive state. During James phase, patient with delayed anterior to posterior transit of boluses as well as impaired  awareness to boluses, leading him to bite at spoon, hold liquids in mouth and have difficulty initiating straw sips. SLP assessed his swallowing with thin liquids and nectar thick liquids. Cortrak feeding tube was present during this test. Epiglottis appeared somewhat edematous and in addition, posterior pharyngeal wall was bulging (suspected cervical osteophytes but no radiologist present to confirm). This resulted in epiglottis making contact with posterior pharyngeal wall during swallow which in turn caused impacted pharyngeal clearance of boluses. After initial swallows of nectar thick and thin liquids, patient with moderate amount of barium remaining in vallecular sinus, posterior pharyngeal wall and pyriform sinus. Patient was frequently moving around and sliding down in chair, which prevented a clear view of PES or trachea. There was an instance of penetration above vocal cords visualized one time and suspected to be occuring frequently. SLP recommending continue NPO at this time but allow ice chips, water sips PRN. Plan for repeat MBS when patient's cognition has improved. Factors that may increase risk of adverse event in presence of aspiration Rubye Oaks [employer] Clearance Coots 2021): Factors that may increase risk of adverse event in presence of aspiration Rubye Oaks [employer] Clearance Coots 2021): Poor  general health and/or compromised immunity; Reduced cognitive function; Frail or deconditioned; Dependence for feeding and/or James hygiene; Inadequate James hygiene Recommendations/Plan: Swallowing Evaluation Recommendations Swallowing Evaluation Recommendations Recommendations: NPO Medication Administration: Via alternative means James care recommendations: James care QID (4x/day); James care before ice chips/water; Staff/trained caregiver to provide James care Treatment Plan Treatment Plan Treatment recommendations: Therapy as outlined in treatment plan below Follow-up recommendations: Skilled nursing-short term rehab (<3 hours/day) Functional status assessment: Patient has had a recent decline in their functional status and demonstrates the ability to make significant improvements in function in a reasonable and predictable amount of time. Treatment frequency: Min 2x/week Treatment duration: 2 weeks Interventions: Aspiration precaution training; Trials of upgraded texture/liquids; Diet toleration management by SLP; Patient/family education Recommendations Recommendations for follow up therapy are one component of a multi-disciplinary discharge planning process, led by the attending physician.  Recommendations may be updated based on patient status, additional functional criteria and insurance authorization. Assessment: Orofacial Exam: Orofacial Exam James Cavity: James Hygiene: WFL James Cavity - Dentition: Poor condition; Missing dentition; Dentures, top; Dentures, not available Orofacial Anatomy: WFL Anatomy: Anatomy: Suspected cervical osteophytes Boluses Administered: Boluses Administered Boluses Administered: Thin liquids (Level 0); Mildly thick liquids (Level 2, nectar thick)  James Impairment Domain: James Impairment Domain Lip Closure: No labial escape Tongue control during bolus hold: Not tested Bolus preparation/mastication: Slow prolonged chewing/mashing with complete recollection Bolus transport/lingual motion:  Delayed initiation of tongue motion (James holding) James residue: Residue collection on James structures Location of James residue : Tongue; Palate Initiation of pharyngeal swallow : Valleculae  Pharyngeal Impairment Domain: Pharyngeal Impairment Domain Laryngeal vestibule closure: None, wide column air/contrast in laryngeal vestibule Pharyngeal stripping wave : Present - diminished Pharyngeal contraction (A/P view only): N/A Pharyngoesophageal segment opening: Partial distention/partial duration, partial obstruction of flow Tongue base retraction: Narrow column of contrast or air between tongue base and PPW Pharyngeal residue: Majority of contrast within or on pharyngeal structures Location of pharyngeal residue: Valleculae; Pharyngeal wall; Pyriform sinuses; Aryepiglottic folds  Esophageal Impairment Domain: No data recorded Pill: No data recorded Penetration/Aspiration Scale Score: Penetration/Aspiration Scale Score 3.  Material enters airway, remains ABOVE vocal cords and not ejected out: Mildly thick liquids (Level 2, nectar thick); Thin liquids (Level 0) Compensatory Strategies: Compensatory Strategies Compensatory strategies: No   General Information: Caregiver present: No  Diet Prior  to this Study: NPO   Temperature : Normal   Respiratory Status: WFL   Supplemental O2: None (Room air)   History of Recent Intubation: No  Behavior/Cognition: Alert; Cooperative; Confused; Requires cueing; Doesn't follow directions Self-Feeding Abilities: Dependent for feeding Baseline vocal quality/speech: Not observed Volitional Cough: Unable to elicit Volitional Swallow: Unable to elicit Exam Limitations: Poor participation Goal Planning: Prognosis for improved oropharyngeal function: Good Barriers to Reach Goals: Cognitive deficits; Severity of deficits No data recorded Patient/Family Stated Goal: none stated Consulted and agree with results and recommendations: Pt unable/family or caregiver not available Pain: Pain Assessment  Pain Assessment: Faces Faces Pain Scale: 0 Breathing: 0 Negative Vocalization: 0 Facial Expression: 0 Body Language: 0 Consolability: 0 PAINAD Score: 0 Pain Location: L leg with PROM Pain Descriptors / Indicators: Discomfort; Grimacing; Guarding Pain Intervention(s): Limited activity within patient's tolerance; Monitored during session; Repositioned End of Session: Start Time:SLP Start Time (ACUTE ONLY): 1400 Stop Time: SLP Stop Time (ACUTE ONLY): 1417 Time Calculation:SLP Time Calculation (min) (ACUTE ONLY): 17 min Charges: SLP Evaluations $ SLP Speech Visit: 1 Visit SLP Evaluations $Swallowing Treatment: 1 Procedure SLP visit diagnosis: SLP Visit Diagnosis: Dysphagia, oropharyngeal phase (R13.12) Past Medical History: Past Medical History: Diagnosis Date  Acquired hypothyroidism 08/15/2019  Acute systolic heart failure (HCC) 06/25/2020  Anemia 02/05/2020  boarderline anemic  Atopic dermatitis 12/05/2019  Bacterial pneumonia, unspecified 06/14/2012  06/14/2012 CXR w/ RML PNA >Zithromax and Rocephin    Bradycardia 11/28/2018  CAD (coronary artery disease) 07/07/2020  Cancer (HCC)   prostate cancer-seed implant  Closed fracture dislocation of right elbow   Closed fracture of right olecranon process 04/10/2020  COPD (chronic obstructive pulmonary disease) (HCC) 01/21/2020  Dermatitis 02/05/2020  Dizziness 02/19/2016  Essential hypertension 11/28/2018  History of prostate cancer   seed implant  Hyperlipemia   Hyperlipidemia, mixed 08/21/2008  Qualifier: Diagnosis of  By: Maple Hudson MD, Clinton D   LVH (left ventricular hypertrophy) 11/28/2018  Malnutrition of moderate degree (HCC) 08/15/2019  Mixed conductive and sensorineural hearing loss, 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  Moderate persistent asthma, uncomplicated 01/21/2020  Nonsustained ventricular tachycardia (HCC) 01/30/2019  NSTEMI (non-ST elevated myocardial infarction) (HCC) 06/21/2020  Other fatigue 02/05/2020  PAF (paroxysmal atrial fibrillation) (HCC) 06/25/2020  Seasonal and perennial allergic  rhinitis 05/17/2007     Senile osteoporosis 08/15/2019  SINUSITIS, ACUTE 06/28/2007  Qualifier: Diagnosis of  By: Clent Ridges NP, Tammy    Ventricular ectopy 11/28/2018 Past Surgical History: Past Surgical History: Procedure Laterality Date  APPENDECTOMY    CORONARY ATHERECTOMY N/A 06/24/2020  Procedure: CORONARY ATHERECTOMY;  Surgeon: Iran Ouch, MD;  Location: [facility];  Service: Cardiovascular;  Laterality: N/A;  CORONARY ULTRASOUND/IVUS N/A 06/24/2020  Procedure: Intravascular Ultrasound/IVUS;  Surgeon: Iran Ouch, MD;  Location: [facility];  Service: Cardiovascular;  Laterality: N/A;  LEFT HEART CATH AND CORONARY ANGIOGRAPHY N/A 06/22/2020  Procedure: LEFT HEART CATH AND CORONARY ANGIOGRAPHY;  Surgeon: Yvonne Kendall, MD;  Location: [facility];  Service: Cardiovascular;  Laterality: N/A;  ORIF ELBOW FRACTURE Right 04/10/2020  Procedure: OPEN REDUCTION INTERNAL FIXATION (ORIF) ELBOW/OLECRANON FRACTURE;  Surgeon: Teryl Lucy, MD;  Location: [facility];  Service: Orthopedics;  Laterality: Right; Angela Nevin, MA, CCC-SLP Speech Therapy   DG CHEST PORT 1 VIEW  Result Date: 12/26/2022 CLINICAL DATA:  Fever EXAM: PORTABLE CHEST 1 VIEW COMPARISON:  12/17/2022 FINDINGS: Mild right basilar atelectasis. Lungs are otherwise clear. No pneumothorax or pleural effusion. Cardiac size is within normal limits. Previously noted trace perihilar  interstitial pulmonary edema has resolved. Nasoenteric feeding tube has been placed, extending into the upper abdomen beyond the margin of the examination. IMPRESSION: 1. Mild right basilar atelectasis. Electronically Signed   By: Helyn Numbers M.D.   On: 12/26/2022 19:39   CT HEAD WO CONTRAST ( )  Result Date: 12/20/2022 CLINICAL DATA:  Subarachnoid hemorrhage [facility]) EXAM: CT HEAD WITHOUT CONTRAST TECHNIQUE: Contiguous axial images were obtained from the base of the skull through the vertex without intravenous contrast. RADIATION  DOSE REDUCTION: This exam was performed according to the departmental dose-optimization program which includes automated exposure control, adjustment of the mA and/or kV according to patient size and/or use of iterative reconstruction technique. COMPARISON:  CT head 12/15/2022 FINDINGS: Brain: Cerebral ventricle sizes are concordant with the degree of cerebral volume loss. Patchy and confluent areas of decreased attenuation are noted throughout the deep and periventricular white matter of the cerebral hemispheres bilaterally, compatible with chronic microvascular ischemic disease. No evidence of large-territorial acute infarction. Question slightly increased in size (limited evaluation due to motion artifact) high frontal bilateral, right greater than left, subarachnoid hemorrhages with question intraparenchymal component. Associated developing mild vasogenic edema on the right. Underlying mass is not excluded. Interval development of a 3 mm right parafalcine subdural hematoma (5:22). Nonspecific bilateral basal ganglia mineralization. No mass effect or midline shift. No hydrocephalus. Basilar cisterns are patent. Vascular: No hyperdense vessel. Atherosclerotic calcifications are present within the cavernous internal carotid arteries. Skull: No acute fracture or focal lesion. Sinuses/Orbits: Paranasal sinuses and mastoid air cells are clear. Bilateral lens replacement. Otherwise the orbits are unremarkable. Other: None. IMPRESSION: 1. Interval development of a 3 mm right parafalcine subdural hematoma. 2. Question slightly increased in size (limited evaluation due to motion artifact) high frontal bilateral, right greater than left, subarachnoid hemorrhages with question intraparenchymal component. Associated developing mild vasogenic edema on the right. Underlying mass is not excluded. Recommend MRI brain with and without contrast for further evaluation. 3. Unable to evaluate for previously visualized left occipital  and right temporal cirrhotic no hemorrhages due to motion artifact. These results will be called to the ordering clinician or representative by the Radiologist Assistant, and communication documented in the PACS or [employer]. Electronically Signed   By: Tish Frederickson M.D.   On: 12/20/2022 19:22   ECHOCARDIOGRAM COMPLETE  Result Date: 12/19/2022    ECHOCARDIOGRAM REPORT   Patient Name:   James Sanabia Sr. Date of Exam: 12/19/2022 Medical Rec #:  [id number]             Height:       72.0 in Accession #:    [id number]            Weight:       140.0 lb Date of Birth:  03-04-1932              BSA:          1.831 m Patient Age:    89 years              BP:           160/92 mmHg Patient Gender: M                     HR:           97 bpm. Exam Location:  Inpatient Procedure: 2D Echo, Cardiac Doppler and Color Doppler Indications:    Atrial Fibrillation I48.91  History:        Patient has  prior history of Echocardiogram examinations, most                 recent 06/23/2020. CHF and Cardiomyopathy, CAD and Previous                 Myocardial Infarction, COPD, Arrythmias:PVC, Tachycardia and                 Atrial Fibrillation; Risk Factors:Hypertension.  Sonographer:    Lucendia Herrlich Referring Phys: [id number] AVWUJW POKHREL  Sonographer Comments: Image acquisition challenging due to patient behavioral factors. IMPRESSIONS  1. Left ventricular ejection fraction, by estimation, is 50 to 55%. The left ventricle has low normal function. The left ventricle has no regional wall motion abnormalities. Left ventricular diastolic parameters are indeterminate.  2. Right ventricular systolic function is normal. The right ventricular size is normal.  3. The mitral valve is normal in structure. Moderate mitral valve regurgitation. No evidence of mitral stenosis.  4. The aortic valve is normal in structure. Aortic valve regurgitation is not visualized. Mild aortic valve stenosis.  5. There is mild dilatation of the aortic root,  measuring 37 mm.  6. The inferior vena cava is normal in size with greater than 50% respiratory variability, suggesting right atrial pressure of 3 mmHg. FINDINGS  Left Ventricle: Left ventricular ejection fraction, by estimation, is 50 to 55%. The left ventricle has low normal function. The left ventricle has no regional wall motion abnormalities. The left ventricular internal cavity size was normal in size. There is no left ventricular hypertrophy. Left ventricular diastolic parameters are indeterminate. Right Ventricle: The right ventricular size is normal. No increase in right ventricular wall thickness. Right ventricular systolic function is normal. Left Atrium: Left atrial size was normal in size. Right Atrium: Right atrial size was normal in size. Pericardium: There is no evidence of pericardial effusion. Mitral Valve: The mitral valve is normal in structure. Moderate mitral valve regurgitation. No evidence of mitral valve stenosis. Tricuspid Valve: The tricuspid valve is normal in structure. Tricuspid valve regurgitation is not demonstrated. No evidence of tricuspid stenosis. Aortic Valve: The aortic valve is normal in structure. Aortic valve regurgitation is not visualized. Aortic regurgitation PHT measures 740 msec. Mild aortic stenosis is present. Aortic valve mean gradient measures 9.0 mmHg. Aortic valve peak gradient measures 17.6 mmHg. Aortic valve area, by VTI measures 1.66 cm. Pulmonic Valve: The pulmonic valve was normal in structure. Pulmonic valve regurgitation is not visualized. No evidence of pulmonic stenosis. Aorta: There is mild dilatation of the aortic root, measuring 37 mm. Venous: The inferior vena cava is normal in size with greater than 50% respiratory variability, suggesting right atrial pressure of 3 mmHg. IAS/Shunts: No atrial level shunt detected by color flow Doppler.  LEFT VENTRICLE PLAX 2D LVIDd:         4.00 cm   Diastology LVIDs:         2.90 cm   LV e' medial:    12.40 cm/s LV  PW:         0.70 cm   LV E/e' medial:  9.0 LV IVS:        1.00 cm   LV e' lateral:   13.50 cm/s LVOT diam:     2.00 cm   LV E/e' lateral: 8.2 LV SV:         52 LV SV Index:   28 LVOT Area:     3.14 cm  RIGHT VENTRICLE  IVC RV S prime:     20.50 cm/s  IVC diam: 1.35 cm TAPSE (M-mode): 1.8 cm LEFT ATRIUM             Index        RIGHT ATRIUM           Index LA diam:        4.00 cm 2.18 cm/m   RA Area:     16.50 cm LA Vol (A2C):   49.4 ml 26.98 ml/m  RA Volume:   40.00 ml  21.85 ml/m LA Vol (A4C):   52.5 ml 28.67 ml/m LA Biplane Vol: 51.2 ml 27.96 ml/m  AORTIC VALVE AV Area (Vmax):    1.49 cm AV Area (Vmean):   1.46 cm AV Area (VTI):     1.66 cm AV Vmax:           210.00 cm/s AV Vmean:          136.000 cm/s AV VTI:            0.311 m AV Peak Grad:      17.6 mmHg AV Mean Grad:      9.0 mmHg LVOT Vmax:         99.40 cm/s LVOT Vmean:        63.367 cm/s LVOT VTI:          0.165 m LVOT/AV VTI ratio: 0.53 AI PHT:            740 msec  AORTA Ao Root diam: 3.70 cm MITRAL VALVE                TRICUSPID VALVE MV Area (PHT): 5.31 cm     TR Peak grad:   36.7 mmHg MV Decel Time: 143 msec     TR Vmax:        303.00 cm/s MR Peak grad: 147.4 mmHg MR Vmax:      607.00 cm/s   SHUNTS MV E velocity: 111.00 cm/s  Systemic VTI:  0.16 m MV A velocity: 123.00 cm/s  Systemic Diam: 2.00 cm MV E/A ratio:  0.90 Kardie Tobb DO Electronically signed by Thomasene Ripple DO Signature Date/Time: 12/19/2022/1:37:51 PM    Final    DG Abd Portable 1V  Result Date: 12/19/2022 CLINICAL DATA:  Feeding tube placement EXAM: PORTABLE ABDOMEN - 1 VIEW COMPARISON:  Chest radiograph done on 12/17/2022 FINDINGS: Tip of feeding tube is seen in the region of the antrum of the stomach. Bowel gas pattern in the upper abdomen is unremarkable. Pelvis is not included in its entirety. Right side of the abdomen is not included in the image. Visualized lung fields are clear. Coronary artery stent is seen. IMPRESSION: Tip of feeding tube is seen in the  region of the antrum of the stomach. Electronically Signed   By: Ernie Avena M.D.   On: 12/19/2022 11:05   DG CHEST PORT 1 VIEW  Result Date: 12/17/2022 CLINICAL DATA:  Fevers EXAM: PORTABLE CHEST 1 VIEW COMPARISON:  12/15/2022 FINDINGS: Cardiac shadow is stable. Aortic calcifications are noted. The lungs are well aerated bilaterally. No focal infiltrate or effusion is seen. No bony abnormality is noted. IMPRESSION: No acute abnormality seen. Electronically Signed   By: Alcide Clever M.D.   On: 12/17/2022 22:15   CT HEAD WO CONTRAST ( )  Result Date: 12/15/2022 CLINICAL DATA:  Fall.  Subarachnoid hemorrhage. EXAM: CT HEAD WITHOUT CONTRAST TECHNIQUE: Contiguous axial images were obtained from the base of the skull through the vertex without  intravenous contrast. RADIATION DOSE REDUCTION: This exam was performed according to the departmental dose-optimization program which includes automated exposure control, adjustment of the mA and/or kV according to patient size and/or use of iterative reconstruction technique. COMPARISON:  Earlier CT dated 12/15/2022. FINDINGS: Evaluation of this exam is limited due to motion artifact. Brain: Mild age-related atrophy and moderate chronic microvascular ischemic changes. No significant interval change in the intracranial hemorrhages seen on the earlier CT. No new hemorrhage. No mass effect or midline shift. Vascular: No hyperdense vessel or unexpected calcification. Skull: Normal. Negative for fracture or focal lesion. Sinuses/Orbits: No acute finding. Other: Laceration of the right forehead. IMPRESSION: No significant interval change in the intracranial hemorrhages seen on the earlier CT. Continued close follow-up recommended. No new hemorrhage. Electronically Signed   By: Elgie Collard M.D.   On: 12/15/2022 21:09   DG Pelvis Portable  Result Date: 12/15/2022 CLINICAL DATA:  Mechanical fall hitting head on counter. EXAM: PORTABLE PELVIS 1-2 VIEWS COMPARISON:   None Available. FINDINGS: The cortical margins of the bony pelvis are intact. No fracture. Pubic symphysis and sacroiliac joints are congruent. Both femoral heads are well-seated in the respective acetabula. Brachytherapy seeds in the prostate. There is a sclerotic focus in the left pelvis above the acetabulum. IMPRESSION: 1. No pelvic fracture. 2. Sclerotic focus in the left pelvis is nonspecific in the setting of prior prostate cancer. Recommend correlation with any outside imaging if available to assess for stability. Electronically Signed   By: Narda Rutherford M.D.   On: 12/15/2022 19:41   DG Chest Port 1 View  Result Date: 12/15/2022 CLINICAL DATA:  Mechanical fall hitting head on counter. EXAM: PORTABLE CHEST 1 VIEW COMPARISON:  06/21/2020 FINDINGS: The heart is normal in size. Coronary stent visualized. Mediastinal contours are unchanged. No pneumothorax, large pleural effusion, or focal airspace disease. On limited assessment, no acute osseous abnormalities are seen. IMPRESSION: No acute chest findings. Electronically Signed   By: Narda Rutherford M.D.   On: 12/15/2022 19:34   CT HEAD WO CONTRAST  Result Date: 12/15/2022 CLINICAL DATA:  Trauma EXAM: CT HEAD WITHOUT CONTRAST TECHNIQUE: Contiguous axial images were obtained from the base of the skull through the vertex without intravenous contrast. RADIATION DOSE REDUCTION: This exam was performed according to the departmental dose-optimization program which includes automated exposure control, adjustment of the mA and/or kV according to patient size and/or use of iterative reconstruction technique. COMPARISON:  Head CT 06/03/2013 FINDINGS: Brain: There are scattered areas of subarachnoid hemorrhage in the left occipital lobe, bilateral high frontal lobes, and right temporal lobe. Small amount of subarachnoid hemorrhage is noted posterior to the right midbrain extending into the cerebral aqueduct. No extra-axial fluid collection or acute infarct. No  mass effect or midline shift. No hydrocephalus. There is new mild periventricular white matter hypodensity, likely chronic small vessel ischemic change. Vascular: Atherosclerotic calcifications are present within the cavernous internal carotid arteries. Skull: Normal. Negative for fracture or focal lesion. Sinuses/Orbits: No acute finding. Other: None. IMPRESSION: 1. Scattered areas of subarachnoid hemorrhage in the left occipital lobe, bilateral high frontal lobes, and right temporal lobe. 2. Small amount of subarachnoid hemorrhage posterior to the right midbrain extending into the cerebral aqueduct. No hydrocephalus. These results were called by telephone at the time of interpretation on 12/15/2022 at 7:17 pm to provider [facility] , who verbally acknowledged these results. Electronically Signed   By: Darliss Cheney M.D.   On: 12/15/2022 19:17   CT CERVICAL SPINE WO CONTRAST  Result Date: 12/15/2022 CLINICAL DATA:  Polytrauma, blunt EXAM: CT CERVICAL SPINE WITHOUT CONTRAST TECHNIQUE: Multidetector CT imaging of the cervical spine was performed without intravenous contrast. Multiplanar CT image reconstructions were also generated. RADIATION DOSE REDUCTION: This exam was performed according to the departmental dose-optimization program which includes automated exposure control, adjustment of the mA and/or kV according to patient size and/or use of iterative reconstruction technique. COMPARISON:  None Available. FINDINGS: Alignment: Slight degenerative anterolisthesis of C7 on T1 and T1 on T2. Skull base and vertebrae: No acute fracture. No primary bone lesion or focal pathologic process. Soft tissues and spinal canal: No prevertebral fluid or swelling. No visible canal hematoma. Disc levels: Bony fusion across the disc spaces from C3-4 through C5-6. Degenerative disc disease at C6-7. Advanced bilateral degenerative facet disease. Upper chest: No acute findings Other: None IMPRESSION: Degenerative disc and facet  disease.  Fusion from C3-4 through C5-6. No acute bony abnormality. Electronically Signed   By: Charlett Nose M.D.   On: 12/15/2022 19:07    Microbiology: Results for orders placed or performed during the hospital encounter of 12/15/22  Urine Culture     Status: Abnormal   Collection Time: 12/17/22  8:20 PM   Specimen: Urine, Catheterized  Result Value Ref Range Status   Specimen Description URINE, CATHETERIZED  Final   Special Requests NONE  Final   Culture (A)  Final    >=100,000 COLONIES/mL ESCHERICHIA COLI Two isolates with different morphologies were identified as the same organism.The most resistant organism was reported. Performed at [facility], [address], [place], [place] [address]    Report Status 12/20/2022 FINAL  Final   Organism ID, Bacteria ESCHERICHIA COLI (A)  Final      Susceptibility   Escherichia coli - MIC*    AMPICILLIN >=32 RESISTANT Resistant     CEFAZOLIN >=64 RESISTANT Resistant     CEFEPIME <=0.12 SENSITIVE Sensitive     CEFTRIAXONE 0.5 SENSITIVE Sensitive     CIPROFLOXACIN <=0.25 SENSITIVE Sensitive     GENTAMICIN <=1 SENSITIVE Sensitive     IMIPENEM <=0.25 SENSITIVE Sensitive     NITROFURANTOIN 64 INTERMEDIATE Intermediate     TRIMETH/SULFA <=20 SENSITIVE Sensitive     PIP/TAZO <=4 SENSITIVE Sensitive     * >=100,000 COLONIES/mL ESCHERICHIA COLI  Resp panel by RT-PCR (RSV, Flu A&B, Covid) Anterior Nasal Swab     Status: None   Collection Time: 12/17/22 10:29 PM   Specimen: Anterior Nasal Swab  Result Value Ref Range Status   SARS Coronavirus 2 by RT PCR NEGATIVE NEGATIVE Final   Influenza A by PCR NEGATIVE NEGATIVE Final   Influenza B by PCR NEGATIVE NEGATIVE Final    Comment: (NOTE) The Xpert Xpress SARS-CoV-2/FLU/RSV plus assay is intended as an aid in the diagnosis of influenza from Nasopharyngeal swab specimens and should not be used as a sole basis for treatment. Nasal washings and aspirates are unacceptable for Xpert Xpress  SARS-CoV-2/FLU/RSV testing.  Fact Sheet for Patients: BloggerCourse.com  Fact Sheet for Healthcare Providers: SeriousBroker.it  This test is not yet approved or cleared by the [place] FDA and has been authorized for detection and/or diagnosis of SARS-CoV-2 by FDA under an Emergency Use Authorization (EUA). This EUA will remain in effect (meaning this test can be used) for the duration of the COVID-19 declaration under Section 564(b)(1) of the Act, 21 U.S.C. section 360bbb-3(b)(1), unless the authorization is terminated or revoked.     Resp Syncytial Virus by PCR NEGATIVE NEGATIVE  Final    Comment: (NOTE) Fact Sheet for Patients: BloggerCourse.com  Fact Sheet for Healthcare Providers: SeriousBroker.it  This test is not yet approved or cleared by the [place] FDA and has been authorized for detection and/or diagnosis of SARS-CoV-2 by FDA under an Emergency Use Authorization (EUA). This EUA will remain in effect (meaning this test can be used) for the duration of the COVID-19 declaration under Section 564(b)(1) of the Act, 21 U.S.C. section 360bbb-3(b)(1), unless the authorization is terminated or revoked.  Performed at [facility], [address], [place], [place] [address]   Culture, Urine (Do not remove urinary catheter, catheter placed by urology or difficult to place)     Status: None   Collection Time: 12/26/22  2:54 PM   Specimen: Urine, Catheterized  Result Value Ref Range Status   Specimen Description URINE, CATHETERIZED  Final   Special Requests NONE  Final   Culture   Final    NO GROWTH Performed at [facility], [address] [place], [place] [address]    Report Status 12/28/2022 FINAL  Final  Culture, blood (Routine X 2) w Reflex to ID Panel     Status: None   Collection Time: 12/26/22  3:32 PM   Specimen: BLOOD RIGHT ARM  Result  Value Ref Range Status   Specimen Description BLOOD RIGHT ARM  Final   Special Requests   Final    BOTTLES DRAWN AEROBIC AND ANAEROBIC Blood Culture adequate volume   Culture   Final    NO GROWTH 5 DAYS Performed at [facility], [address] [place], [place] [address]    Report Status 12/31/2022 FINAL  Final  Culture, blood (Routine X 2) w Reflex to ID Panel     Status: None   Collection Time: 12/26/22  3:33 PM   Specimen: BLOOD LEFT WRIST  Result Value Ref Range Status   Specimen Description BLOOD LEFT WRIST  Final   Special Requests   Final    BOTTLES DRAWN AEROBIC AND ANAEROBIC Blood Culture adequate volume   Culture   Final    NO GROWTH 5 DAYS Performed at [facility], [address], [place], [place] [address]    Report Status 12/31/2022 FINAL  Final  Culture, Urine (Do not remove urinary catheter, catheter placed by urology or difficult to place)     Status: Abnormal   Collection Time: 01/09/23 11:15 AM   Specimen: Urine, Catheterized  Result Value Ref Range Status   Specimen Description URINE, CATHETERIZED  Final   Special Requests   Final    NONE Performed at [facility], [address], [place], [place] [address]    Culture (A)  Final    >=100,000 COLONIES/mL ESCHERICHIA COLI >=100,000 COLONIES/mL ENTEROBACTER CLOACAE    Report Status 01/11/2023 FINAL  Final   Organism ID, Bacteria ESCHERICHIA COLI (A)  Final   Organism ID, Bacteria ENTEROBACTER CLOACAE (A)  Final      Susceptibility   Enterobacter cloacae - MIC*    CEFEPIME 2 SENSITIVE Sensitive     CIPROFLOXACIN <=0.25 SENSITIVE Sensitive     GENTAMICIN <=1 SENSITIVE Sensitive     IMIPENEM <=0.25 SENSITIVE Sensitive     NITROFURANTOIN 64 INTERMEDIATE Intermediate     TRIMETH/SULFA <=20 SENSITIVE Sensitive     PIP/TAZO >=128 RESISTANT Resistant     * >=100,000 COLONIES/mL ENTEROBACTER CLOACAE   Escherichia coli - MIC*    AMPICILLIN >=32 RESISTANT Resistant  CEFAZOLIN <=4 SENSITIVE  Sensitive     CEFEPIME <=0.12 SENSITIVE Sensitive     CEFTRIAXONE <=0.25 SENSITIVE Sensitive     CIPROFLOXACIN <=0.25 SENSITIVE Sensitive     GENTAMICIN <=1 SENSITIVE Sensitive     IMIPENEM <=0.25 SENSITIVE Sensitive     NITROFURANTOIN <=16 SENSITIVE Sensitive     TRIMETH/SULFA <=20 SENSITIVE Sensitive     AMPICILLIN/SULBACTAM 16 INTERMEDIATE Intermediate     PIP/TAZO <=4 SENSITIVE Sensitive     * >=100,000 COLONIES/mL ESCHERICHIA COLI  Urine Culture (for pregnant, neutropenic or urologic patients or patients with an indwelling urinary catheter)     Status: Abnormal   Collection Time: 01/12/23 12:06 PM   Specimen: Urine, Catheterized  Result Value Ref Range Status   Specimen Description URINE, CATHETERIZED  Final   Special Requests NONE  Final   Culture (A)  Final    <10,000 COLONIES/mL INSIGNIFICANT GROWTH Performed at [facility], [address] [place], [place] [address]    Report Status 01/13/2023 FINAL  Final    Labs: CBC: Recent Labs  Lab 01/07/23 0408 01/09/23 0430 01/10/23 0721 01/11/23 0528 01/12/23 1138  WBC 12.3* 13.2* 10.8* 12.5* 11.1*  HGB 11.2* 10.9* 9.9* 11.3* 10.9*  HCT 34.9* 34.3* 30.3* 35.3* 33.5*  MCV 97.5 97.2 94.7 95.4 97.4  PLT 399 346 310 343 310   Basic Metabolic Panel: Recent Labs  Lab 01/07/23 0408 01/09/23 0430 01/10/23 0721 01/11/23 0528 01/12/23 1138  NA 134* 135 130* 133* 134*  K 4.2 5.3* 4.0 3.9 4.9  CL 100 105 100 101 100  CO2 23 24 21* 20* 24  GLUCOSE 130* 137* 99 121* 117*  BUN 24* 24* 18 18 23   CREATININE 0.67 0.72 0.60* 0.62 0.65  CALCIUM 8.9 8.5* 8.1* 8.5* 8.4*   Liver Function Tests: No results for input(s): "AST", "ALT", "ALKPHOS", "BILITOT", "PROT", "ALBUMIN" in the last 168 hours. CBG: Recent Labs  Lab 01/12/23 1538 01/12/23 1940 01/12/23 2311 01/13/23 0326 01/13/23 0742  GLUCAP 108* 128* 123* 145* 131*    Discharge time spent: greater than 30 minutes.  Signed: Alba Cory, MD [facility] 01/13/2023

## 2023-01-13 NOTE — Progress Notes (Signed)
Physical Therapy Treatment Patient Details Name: James Mehnert Sr. MRN: [id number] DOB: 11/13/1931 Today's Date: 01/13/2023   History of Present Illness Pt is an 89 y.o. male who presented 12/15/22 s/p fall with head trauma. CTH shows diffuse scattered traumatic pattern subarachnoid hemorrhage. Cortrak placed 8/12. Peg 01/10/2023. PMHx includes COPD, prior NSTEMI / CHF, NSVT, PAF, anemia, bradycardia, senile osteoporosis, CAD, HLD, HTN    PT Comments  The pt was agreeable to session with focus on seated balance and decreasing retropulsion in sitting. The pt engaged well with activities for increased trunk flexion in sitting (see below) and was able to maintain static sitting at times with CGA. At times needing increased assistance to resist posterior lean. Pt with inconsistent command following and therefore still needing increased assist with bed mobility and with max multimodal cues for activities. D/c recommendations remain appropriate at this time.     If plan is discharge home, recommend the following: Two people to help with walking and/or transfers;Two people to help with bathing/dressing/bathroom;Direct supervision/assist for financial management;Direct supervision/assist for medications management;Assistance with feeding;Help with stairs or ramp for entrance;Assist for transportation;Assistance with cooking/housework   Can travel by private vehicle     No  Equipment Recommendations  Other (comment) (defer to post acute)    Recommendations for Other Services       Precautions / Restrictions Precautions Precautions: Fall;Other (comment) Precaution Comments: PEG; abdominal binder watch HR Restrictions Weight Bearing Restrictions: No     Mobility  Bed Mobility Overal bed mobility: Needs Assistance Bed Mobility: Supine to Sit, Sit to Supine     Supine to sit: Mod assist, +2 for physical assistance, +2 for safety/equipment Sit to supine: Mod assist, +2 for safety/equipment, +2  for physical assistance   General bed mobility comments: Patient minimally more participatory with transfer, however does not follow commands consistently enough to initiate. Patient with decreased retropulsion in sitting, and able to particpate in minimal trunk flexion and rotation, in addition to functional reaching (able to reach outward, active assistance provided to reach upward). Patient unable to kick legs consistently with hamstring tightness and discomfort noted.    Transfers                   General transfer comment: Did not attempt       Balance Overall balance assessment: Needs assistance Sitting-balance support: Feet supported, Bilateral upper extremity supported, No upper extremity supported Sitting balance-Leahy Scale: Fair Sitting balance - Comments: able to prop on R elbow and maintain balance                                    Cognition Arousal: Alert Behavior During Therapy: Restless, Flat affect Overall Cognitive Status: Impaired/Different from baseline Area of Impairment: Attention, Memory, Following commands, Safety/judgement, Awareness, Problem solving               Rancho Levels of Cognitive Functioning [place]igos Scales of Cognitive Functioning: Confused, Inappropriate Non-Agitated: Maximal Assistance Orientation Level: Disoriented to, Place, Time, Situation Current Attention Level: Focused Memory: Decreased recall of precautions, Decreased short-term memory Following Commands: Follows one step commands inconsistently, Follows one step commands with increased time Safety/Judgement: Decreased awareness of safety, Decreased awareness of deficits Awareness: Intellectual Problem Solving: Slow processing, Decreased initiation, Difficulty sequencing, Requires verbal cues, Requires tactile cues General Comments: Patient less anxious in session, however follows approximately 25% of commands and remains inconsistent for all  answers.   Rancho [employer] Scales of Cognitive Functioning: Confused, Inappropriate Non-Agitated: Maximal Assistance [V]    Exercises Other Exercises Other Exercises: seated trunk flexion, pt tasked with pushing recliner away and pulling it back x10 while seated on EOB. improved range within set Other Exercises: seated trunk rotation using recliner. moved recliner in arc shape in front of patient while he held armrests to facilitate trunk flexion and rotation Other Exercises: seated reaches (punching to pillow) x10 each arm, mostly at abdominal level Other Exercises: seated shoulder flexion. AAROM with improved movement [name]    General Comments General comments (skin integrity, edema, etc.): daughter present and supportive. HR to 130s with sitting balance practice      Pertinent Vitals/Pain Pain Assessment Pain Assessment: Faces Faces Pain Scale: Hurts even more Pain Location: bilateral legs with attempted PROM Pain Descriptors / Indicators: Discomfort, Grimacing Pain Intervention(s): Limited activity within patient's tolerance, Monitored during session, Repositioned     PT Goals (current goals can now be found in the care plan section) Acute Rehab PT Goals Patient Stated Goal: family hopes for pt to improve PT Goal Formulation: With patient/family Time For Goal Achievement: 01/20/23 Potential to Achieve Goals: Fair Progress towards PT goals: Progressing toward goals    Frequency    Min 1X/week      PT Plan      Co-evaluation PT/OT/SLP Co-Evaluation/Treatment: Yes Reason for Co-Treatment: Necessary to address cognition/behavior during functional activity;For patient/therapist safety;To address functional/ADL transfers PT goals addressed during session: Mobility/safety with mobility;Balance OT goals addressed during session: ADL's and self-care;Strengthening/ROM      AM-PAC PT "6 Clicks" Mobility   Outcome Measure  Help needed turning from your back to your side  while in a flat bed without using bedrails?: A Lot Help needed moving from lying on your back to sitting on the side of a flat bed without using bedrails?: Total Help needed moving to and from a bed to a chair (including a wheelchair)?: Total Help needed standing up from a chair using your arms (e.g., wheelchair or bedside chair)?: Total Help needed to walk in hospital room?: Total Help needed climbing 3-5 steps with a railing? : Total 6 Click Score: 7    End of Session   Activity Tolerance: Patient tolerated treatment well Patient left: in bed;with call bell/phone within reach;with bed alarm set;with family/visitor present Nurse Communication: Mobility status PT Visit Diagnosis: Other abnormalities of gait and mobility (R26.89);Muscle weakness (generalized) (M62.81);Other symptoms and signs involving the nervous system (R29.898);Pain;Unsteadiness on feet (R26.81);Difficulty in walking, not elsewhere classified (R26.2) Pain - part of body:  (generalized, back)     Time: 4098-1191 PT Time Calculation (min) (ACUTE ONLY): 29 min  Charges:    $Therapeutic Exercise: 8-22 mins PT General Charges $$ ACUTE PT VISIT: 1 Visit                     Vickki Muff, PT, DPT   Acute Rehabilitation Department Office [phone] Secure Chat Communication Preferred   Ronnie Derby 01/13/2023, 1:35 PM

## 2023-01-13 NOTE — Progress Notes (Addendum)
Occupational Therapy Treatment Patient Details Name: James Mattia Sr. MRN: [id number] DOB: 1931-08-13 Today's Date: 01/13/2023   History of present illness Pt is an 89 y.o. male who presented 12/15/22 s/p fall with head trauma. CTH shows diffuse scattered traumatic pattern subarachnoid hemorrhage. Cortrak placed 8/12. Peg 01/10/2023. PMHx includes COPD, prior NSTEMI / CHF, NSVT, PAF, anemia, bradycardia, senile osteoporosis, CAD, HLD, HTN   OT comments  Patient with continued incremental progress towards goals. Patient minimally more participatory with EOB transfer, however does not follow commands consistently enough to initiate. Patient with decreased retropulsion in sitting, and able to particpate in minimal trunk flexion and rotation, in addition to functional reaching (able to reach outward, active assistance provided to reach upward). Patient unable to kick legs consistently with hamstring tightness and discomfort noted. Patient continues to follow less than 25% off commands and remains total care for all ADLs and functional mobility. Patient placed in chair position at end of session in bed. Discharge remains appropriate, will continue to follow.       If plan is discharge home, recommend the following:  Two people to help with walking and/or transfers;Two people to help with bathing/dressing/bathroom;Assistance with cooking/housework;Assistance with feeding;Direct supervision/assist for medications management;Direct supervision/assist for financial management;Assist for transportation   Equipment Recommendations  Other (comment)    Recommendations for Other Services      Precautions / Restrictions Precautions Precautions: Fall;Other (comment) Precaution Comments: PEG; abdominal binder watch HR Restrictions Weight Bearing Restrictions: No       Mobility Bed Mobility Overal bed mobility: Needs Assistance Bed Mobility: Supine to Sit, Sit to Supine     Supine to sit: Mod assist,  +2 for physical assistance, +2 for safety/equipment Sit to supine: Mod assist, +2 for safety/equipment, +2 for physical assistance   General bed mobility comments: Patient minimally more participatory with transfer, however does not follow commands consistently enough to come initiate. Patient with decreased retropulsion in sitting, and able to particpate in minimal trunk flexion and rotation, in addition to functional reaching (able to reach outward, active assistance provided to reach upward). Patient unable to kick legs consistently with hamstring tightness and discomfort noted.    Transfers                   General transfer comment: Did not attempt     Balance Overall balance assessment: Needs assistance Sitting-balance support: Feet supported, Bilateral upper extremity supported, No upper extremity supported Sitting balance-Leahy Scale: Fair Sitting balance - Comments: able to prop on R elbow and maintain balance                                   ADL either performed or assessed with clinical judgement   ADL Overall ADL's : Needs assistance/impaired Eating/Feeding: NPO Eating/Feeding Details (indicate cue type and reason): peg tube Grooming: Maximal assistance;Sitting Grooming Details (indicate cue type and reason): minimal initiation of attempting to wash face             Lower Body Dressing: Total assistance               Functional mobility during ADLs: Total assistance;+2 for physical assistance;+2 for safety/equipment General ADL Comments: total A    Extremity/Trunk Assessment              Vision       Perception     Praxis      Cognition Arousal:  Alert Behavior During Therapy: Restless, Flat affect Overall Cognitive Status: Impaired/Different from baseline Area of Impairment: Attention, Memory, Following commands, Safety/judgement, Awareness, Problem solving               Rancho Levels of Cognitive  Functioning Rancho Los Amigos Scales of Cognitive Functioning: Confused, Inappropriate Non-Agitated: Maximal Assistance Orientation Level: Disoriented to, Place, Time, Situation Current Attention Level: Focused Memory: Decreased recall of precautions, Decreased short-term memory Following Commands: Follows one step commands inconsistently, Follows one step commands with increased time Safety/Judgement: Decreased awareness of safety, Decreased awareness of deficits Awareness: Intellectual Problem Solving: Slow processing, Decreased initiation, Difficulty sequencing, Requires verbal cues, Requires tactile cues General Comments: Patient less anxious in session, however follows approximately 25% of commands and remains inconsistent for all answers. Rancho Mirant Scales of Cognitive Functioning: Confused, Inappropriate Non-Agitated: Maximal Assistance [V]      Exercises      Shoulder Instructions       General Comments      Pertinent Vitals/ Pain       Pain Assessment Pain Assessment: Faces Faces Pain Scale: Hurts even more Pain Location: bilateral legs with attempted PROM Pain Descriptors / Indicators: Discomfort, Grimacing Pain Intervention(s): Limited activity within patient's tolerance, Monitored during session, Repositioned  Home Living                                          Prior Functioning/Environment              Frequency  Min 1X/week        Progress Toward Goals  OT Goals(current goals can now be found in the care plan section)  Progress towards OT goals: Progressing toward goals  Acute Rehab OT Goals Patient Stated Goal: unable OT Goal Formulation: Patient unable to participate in goal setting Time For Goal Achievement: 01/18/23 Potential to Achieve Goals: Poor  Plan      Co-evaluation    PT/OT/SLP Co-Evaluation/Treatment: Yes Reason for Co-Treatment: Necessary to address cognition/behavior during functional activity;For  patient/therapist safety;To address functional/ADL transfers PT goals addressed during session: Mobility/safety with mobility;Balance OT goals addressed during session: ADL's and self-care;Strengthening/ROM      AM-PAC OT "6 Clicks" Daily Activity     Outcome Measure   Help from another person eating meals?: Total Help from another person taking care of personal grooming?: Total Help from another person toileting, which includes using toliet, bedpan, or urinal?: Total Help from another person bathing (including washing, rinsing, drying)?: Total Help from another person to put on and taking off regular upper body clothing?: Total Help from another person to put on and taking off regular lower body clothing?: Total 6 Click Score: 6    End of Session    OT Visit Diagnosis: Unsteadiness on feet (R26.81);Other symptoms and signs involving the nervous system (R29.898);Other symptoms and signs involving cognitive function   Activity Tolerance Patient tolerated treatment well   Patient Left in bed;with call bell/phone within reach;with bed alarm set;Other (comment);with family/visitor present (chair position)   Nurse Communication Mobility status        Time: [phone] OT Time Calculation (min): 29 min  Charges: OT General Charges $OT Visit: 1 Visit OT Treatments $Self Care/Home Management : 8-22 mins  [name], OTR/L Acute Rehabilitation Services [phone]   Cherlyn Cushing 01/13/2023, 1:06 PM

## 2023-01-13 NOTE — TOC Progression Note (Signed)
Transition of Care (TOC) - Progression Note    Patient Details  Name: James Kobus Sr. MRN: [id number] Date of Birth: [birth year]  Transition of Care [facility]) CM/SW Contact  Eduard Roux, [place] Phone Number: 01/13/2023, 10:54 AM  Clinical Narrative:     Received message from patient's daughter, SNF choice is [facility].   [facility] confirmed availability- [facility] will be started today.  Antony Blackbird, MSW, LCSW Clinical Social Worker    Expected Discharge Plan: Skilled Nursing Facility Barriers to Discharge: Continued Medical Work up  Expected Discharge Plan and Services In-house Referral: Clinical Social Work       Expected Discharge Date: 01/13/23                                     Social Determinants of Health (SDOH) Interventions SDOH Screenings   Food Insecurity: No Food Insecurity (12/16/2022)  Housing: Low Risk  (12/16/2022)  Transportation Needs: No Transportation Needs (12/16/2022)  Utilities: Not At Risk (12/16/2022)  Alcohol Screen: Low Risk  (07/08/2022)  Depression (PHQ2-9): Low Risk  (07/26/2022)  Financial Resource Strain: Low Risk  (03/30/2022)  Physical Activity: Insufficiently Active (01/29/2021)  Social Connections: Socially Integrated (01/29/2021)  Stress: No Stress Concern Present (01/29/2021)  Tobacco Use: Medium Risk (01/05/2023)    Readmission Risk Interventions     No data to display

## 2023-01-13 NOTE — Care Management Important Message (Signed)
Important Message  Patient Details  Name: James Kosakowski Sr. MRN: [id number] Date of Birth: 03-05-1932   Medicare Important Message Given:  Yes     [name] 01/13/2023, 2:35 PM

## 2023-01-14 DIAGNOSIS — I609 Nontraumatic subarachnoid hemorrhage, unspecified: Secondary | ICD-10-CM | POA: Diagnosis not present

## 2023-01-14 LAB — CBC
HCT: 34.4 % — ABNORMAL LOW (ref 39.0–52.0)
Hemoglobin: 10.9 g/dL — ABNORMAL LOW (ref 13.0–17.0)
MCH: 30.1 pg (ref 26.0–34.0)
MCHC: 31.7 g/dL (ref 30.0–36.0)
MCV: 95 fL (ref 80.0–100.0)
Platelets: 301 10*3/uL (ref 150–400)
RBC: 3.62 MIL/uL — ABNORMAL LOW (ref 4.22–5.81)
RDW: 12.9 % (ref 11.5–15.5)
WBC: 10.5 10*3/uL (ref 4.0–10.5)
nRBC: 0 % (ref 0.0–0.2)

## 2023-01-14 LAB — GLUCOSE, CAPILLARY
Glucose-Capillary: 110 mg/dL — ABNORMAL HIGH (ref 70–99)
Glucose-Capillary: 113 mg/dL — ABNORMAL HIGH (ref 70–99)
Glucose-Capillary: 115 mg/dL — ABNORMAL HIGH (ref 70–99)
Glucose-Capillary: 123 mg/dL — ABNORMAL HIGH (ref 70–99)
Glucose-Capillary: 131 mg/dL — ABNORMAL HIGH (ref 70–99)
Glucose-Capillary: 136 mg/dL — ABNORMAL HIGH (ref 70–99)

## 2023-01-14 LAB — BASIC METABOLIC PANEL
Anion gap: 6 (ref 5–15)
BUN: 26 mg/dL — ABNORMAL HIGH (ref 8–23)
CO2: 23 mmol/L (ref 22–32)
Calcium: 8.1 mg/dL — ABNORMAL LOW (ref 8.9–10.3)
Chloride: 102 mmol/L (ref 98–111)
Creatinine, Ser: 0.73 mg/dL (ref 0.61–1.24)
GFR, Estimated: >60 mL/min (ref >60)
Glucose, Bld: 109 mg/dL — ABNORMAL HIGH (ref 70–99)
Potassium: 4.6 mmol/L (ref 3.5–5.1)
Sodium: 131 mmol/L — ABNORMAL LOW (ref 135–145)

## 2023-01-14 MED ORDER — SODIUM CHLORIDE 0.9 % IV SOLN: INTRAVENOUS | Status: DC

## 2023-01-14 MED ORDER — FAMOTIDINE 40 MG/5ML PO SUSR
40.0000 mg | Freq: Every day | ORAL | Status: DC
  Administered 2023-01-14 – 2023-01-19 (×6): 40 mg
  Filled 2023-01-14 (×5): qty 5

## 2023-01-14 NOTE — TOC Progression Note (Signed)
Per Wynona Canes with [facility], Drucie Opitz remains pending at this time. Will provide updates as available.   Dellie Burns, MSW, LCSW [phone] (coverage)

## 2023-01-14 NOTE — Progress Notes (Signed)
PROGRESS NOTE    James Smialek Sr.  ZOX:[id number] DOB: 1931/08/06 DOA: 12/15/2022 PCP: Blane Ohara, MD   Brief Narrative: 89 year old with past medical history significant for A-fib, known SVT, ischemic cardiomyopathy (non-STEMI status post LAD stent 2022) heart failure reduced ejection fraction (left ventricular ejection fraction improved to 55 on 2022 from 35%), hypertension, COPD, hypothyroidism, chronic anemia, frequent falls presents AT/8 after a fall found to have a traumatic subarachnoid hemorrhage.  Hospitalization has been complicated by E. coli UTI, asymptomatic runs of A-fib and PVC for which cardiology was consulted, acute urinary retention for which urology was consulted, placed urinary catheter 8/12, dysphagia for which core track has been placed and persistent encephalopathy.  Awaiting placement.    Assessment & Plan:   Principal Problem:   Subarachnoid hemorrhage (HCC) Active Problems:   Frequent falls   Acute metabolic encephalopathy   Frequent PVCs   PAF (paroxysmal atrial fibrillation) (HCC)   Chronic HFrEF with improved EF(heart failure with reduced ejection fraction) (HCC)   Ischemic cardiomyopathy   SIRS (systemic inflammatory response syndrome) (HCC)   CAD S/P LAD stent 2022   History of recent fall   Essential hypertension   Acquired hypothyroidism   Mixed conductive and sensorineural hearing loss, bilateral   Moderate persistent asthma, uncomplicated   Anemia   At high risk for bleeding   Hypokalemia   Protein-calorie malnutrition, severe   Urinary retention  1-Traumatic subarachnoid hemorrhage/subdural hematoma: -CT 8/13 mentioned vasogenic edema and expected redistribution of contusions.  Repeat CT scan 8/26 shows improvement in size of SDH. Still encephalopathic.  Had peg tube placed 9/03. Tolerating tube feeding.   Recurrent UTI:  Fever, persistent leukocytosis Recurrent fevers and UTI during this hospitalization Recently completed 7  days of ceftriaxone for E. coli UTI Fever 2 days ago and restarted on IV ceftriaxone. Urine culture growing enterobacter and E coli. Started on Bactrim. Will need 7-10 day Tx Foley catheter exchange 9/05.  TBI with acute encephalopathy Alert, aphasic. No significant improvement during this admission.  B 12 500, normal.  Thiamine level pending.  Started  thiamine.   Protein caloric malnutrition/dysphagia Family agree with PEG tube placement. Started on tube feeding.   PEG tube placement 9/3; tolerating tube feeding.   E. coli UTI: Completed ceftriaxone previously.   Acute urinary retention, BPH, prostate cancer status post brachytherapy Neurology was consulted Dr. Benita Gutter over a wire.  He will need catheter until he is more mobile. Will likely need to go to SNF with catheter Needs to follow up with urology for Voiding trial Foley catheter exchange 9/05/. He will need foley exchange every 30 days.   PAF w/RVR, frequent PVCs, hx NSVT Heart rate is better.  Continue metoprolol.   Holding full dose anticoagulation due to fall risk and current SAH, SDH. Not a good candidate for Watchman device.   CAD s/p LAD stent 2022 Elevated troponin is mild at 32, flat, without angina consistent with demand myocardial ischemia.  Continue beta blocker, statin. Not on antiplatelet due to bleeding.    Chronic HFrEF with improved EF (35>55%, 2022): Continue metoprolol.   Remains euvolemic.   Hypokalemia Resolved.   Hyponatremia:  Stable.  Continue to monitor.  will give low rate IV fluid  Hypophosphatemia:  Resolved    Chronic normocytic anemia:     Moderate persistent asthma, uncomplicated No exacerbation. - Continue DuoNebs.   Acquired hypothyroidism TSH is 6.177, free T4 is normal.  - Continue levothyroxine 175 mcg daily   Essential hypertension Continue  with metoprolol/    Debility, deconditioning, frailty -needs rehab.    Constipation -bowel regimen  PRN      Nutrition Problem: Severe Malnutrition Etiology: chronic illness (COPD, HFrEF)    Signs/Symptoms: severe fat depletion, severe muscle depletion    Interventions: Tube feeding, Refer to RD note for recommendations  Estimated body mass index is 17.67 kg/m as calculated from the following:   Height as of this encounter: 6' (1.829 m).   Weight as of this encounter: 59.1 kg.   DVT prophylaxis: heparin  Code Status: Full code Family Communication: care discussed with daughter at bedside. .  Disposition Plan:  Status is: Inpatient Remains inpatient appropriate because:Discharge tomorrow.     Consultants:  Neurosurgery   Procedures:    Antimicrobials:    Subjective: Alert. No complaints.  Says few words. Daughter relates he has been talking to her  Objective: Vitals:   01/13/23 2320 01/14/23 0305 01/14/23 0713 01/14/23 0737  BP: 131/89 110/73  130/81  Pulse: 76 83  86  Resp: (!) 23 (!) 25  19  Temp: 99.1 F (37.3 C) 99.8 F (37.7 C)  97.7 F (36.5 C)  TempSrc: Axillary Axillary  Oral  SpO2: 97% 98%  98%  Weight:   59.1 kg   Height:        Intake/Output Summary (Last 24 hours) at 01/14/2023 0941 Last data filed at 01/14/2023 0038 Gross per 24 hour  Intake 650 ml  Output 1075 ml  Net -425 ml   Filed Weights   01/09/23 0315 01/10/23 0253 01/14/23 0713  Weight: 54.3 kg 54.1 kg 59.1 kg    Examination:  General exam: NAD Respiratory system: CTA Cardiovascular system: S 1, S 2 RRR Gastrointestinal system: BS present, soft nt  Peg tube placed Central nervous system: Alert Extremities: no edema   Data Reviewed: I have personally reviewed following labs and imaging studies  CBC: Recent Labs  Lab 01/09/23 0430 01/10/23 0721 01/11/23 0528 01/12/23 1138 01/14/23 0820  WBC 13.2* 10.8* 12.5* 11.1* 10.5  HGB 10.9* 9.9* 11.3* 10.9* 10.9*  HCT 34.3* 30.3* 35.3* 33.5* 34.4*  MCV 97.2 94.7 95.4 97.4 95.0  PLT 346 310 343 310 301   Basic  Metabolic Panel: Recent Labs  Lab 01/09/23 0430 01/10/23 0721 01/11/23 0528 01/12/23 1138 01/14/23 0820  NA 135 130* 133* 134* 131*  K 5.3* 4.0 3.9 4.9 4.6  CL 105 100 101 100 102  CO2 24 21* 20* 24 23  GLUCOSE 137* 99 121* 117* 109*  BUN 24* 18 18 23  26*  CREATININE 0.72 0.60* 0.62 0.65 0.73  CALCIUM 8.5* 8.1* 8.5* 8.4* 8.1*   GFR: Estimated Creatinine Clearance: 52.3 mL/min (by C-G formula based on SCr of 0.73 mg/dL). Liver Function Tests: No results for input(s): "AST", "ALT", "ALKPHOS", "BILITOT", "PROT", "ALBUMIN" in the last 168 hours. No results for input(s): "LIPASE", "AMYLASE" in the last 168 hours. No results for input(s): "AMMONIA" in the last 168 hours. Coagulation Profile: No results for input(s): "INR", "PROTIME" in the last 168 hours.  Cardiac Enzymes: No results for input(s): "CKTOTAL", "CKMB", "CKMBINDEX", "TROPONINI" in the last 168 hours. BNP (last 3 results) Recent Labs    06/30/22 1210  PROBNP 810*   HbA1C: No results for input(s): "HGBA1C" in the last 72 hours. CBG: Recent Labs  Lab 01/13/23 1602 01/13/23 1939 01/13/23 2329 01/14/23 0314 01/14/23 0756  GLUCAP 140* 144* 126* 115* 110*   Lipid Profile: No results for input(s): "CHOL", "HDL", "LDLCALC", "TRIG", "CHOLHDL", "LDLDIRECT" in  the last 72 hours. Thyroid Function Tests: No results for input(s): "TSH", "T4TOTAL", "FREET4", "T3FREE", "THYROIDAB" in the last 72 hours. Anemia Panel: No results for input(s): "VITAMINB12", "FOLATE", "FERRITIN", "TIBC", "IRON", "RETICCTPCT" in the last 72 hours.  Sepsis Labs: Recent Labs  Lab 01/10/23 0721  PROCALCITON 0.13    Recent Results (from the past 240 hour(s))  Culture, Urine (Do not remove urinary catheter, catheter placed by urology or difficult to place)     Status: Abnormal   Collection Time: 01/09/23 11:15 AM   Specimen: Urine, Catheterized  Result Value Ref Range Status   Specimen Description URINE, CATHETERIZED  Final   Special  Requests   Final    NONE Performed at [facility], [address] [place], [place] [address]    Culture (A)  Final    >=100,000 COLONIES/mL ESCHERICHIA COLI >=100,000 COLONIES/mL ENTEROBACTER CLOACAE    Report Status 01/11/2023 FINAL  Final   Organism ID, Bacteria ESCHERICHIA COLI (A)  Final   Organism ID, Bacteria ENTEROBACTER CLOACAE (A)  Final      Susceptibility   Enterobacter cloacae - MIC*    CEFEPIME 2 SENSITIVE Sensitive     CIPROFLOXACIN <=0.25 SENSITIVE Sensitive     GENTAMICIN <=1 SENSITIVE Sensitive     IMIPENEM <=0.25 SENSITIVE Sensitive     NITROFURANTOIN 64 INTERMEDIATE Intermediate     TRIMETH/SULFA <=20 SENSITIVE Sensitive     PIP/TAZO >=128 RESISTANT Resistant     * >=100,000 COLONIES/mL ENTEROBACTER CLOACAE   Escherichia coli - MIC*    AMPICILLIN >=32 RESISTANT Resistant     CEFAZOLIN <=4 SENSITIVE Sensitive     CEFEPIME <=0.12 SENSITIVE Sensitive     CEFTRIAXONE <=0.25 SENSITIVE Sensitive     CIPROFLOXACIN <=0.25 SENSITIVE Sensitive     GENTAMICIN <=1 SENSITIVE Sensitive     IMIPENEM <=0.25 SENSITIVE Sensitive     NITROFURANTOIN <=16 SENSITIVE Sensitive     TRIMETH/SULFA <=20 SENSITIVE Sensitive     AMPICILLIN/SULBACTAM 16 INTERMEDIATE Intermediate     PIP/TAZO <=4 SENSITIVE Sensitive     * >=100,000 COLONIES/mL ESCHERICHIA COLI  Urine Culture (for pregnant, neutropenic or urologic patients or patients with an indwelling urinary catheter)     Status: Abnormal   Collection Time: 01/12/23 12:06 PM   Specimen: Urine, Catheterized  Result Value Ref Range Status   Specimen Description URINE, CATHETERIZED  Final   Special Requests NONE  Final   Culture (A)  Final    <10,000 COLONIES/mL INSIGNIFICANT GROWTH Performed at [facility], [address] [place], [place] [address]    Report Status 01/13/2023 FINAL  Final         Radiology Studies: No results found.      Scheduled Meds:  Chlorhexidine Gluconate Cloth  6 each Topical  Daily   docusate  100 mg Per Tube BID   famotidine  40 mg Oral Daily   feeding supplement (PROSource TF20)  60 mL Per Tube Daily   free water  100 mL Per Tube Q12H   heparin injection (subcutaneous)  5,000 Units Subcutaneous Q12H   levothyroxine  175 mcg Per Tube Q0600   loratadine  10 mg Per Tube Daily   metoprolol tartrate  25 mg Per Tube BID   neomycin-bacitracin-polymyxin   Topical Daily   mouth rinse  15 mL Mouth Rinse 4 times per day   sulfamethoxazole-trimethoprim  1 tablet Per Tube Q12H   terbinafine  250 mg Per Tube Daily   thiamine  100 mg Per  Tube Daily   Continuous Infusions:  sodium chloride 10 mL/hr at 01/10/23 1344   sodium chloride     feeding supplement (OSMOLITE 1.2 CAL) 1,000 mL (01/14/23 0551)     LOS: 29 days    Time spent: 35 minutes    Deveron Shamoon A Amberlea Spagnuolo, MD [facility]   If 7PM-7AM, please contact night-coverage [email or web address]  01/14/2023, 9:41 AM

## 2023-01-15 ENCOUNTER — Inpatient Hospital Stay (HOSPITAL_COMMUNITY): Payer: Medicare HMO

## 2023-01-15 DIAGNOSIS — I609 Nontraumatic subarachnoid hemorrhage, unspecified: Secondary | ICD-10-CM | POA: Diagnosis not present

## 2023-01-15 LAB — GLUCOSE, CAPILLARY
Glucose-Capillary: 61 mg/dL — ABNORMAL LOW (ref 70–99)
Glucose-Capillary: 150 mg/dL — ABNORMAL HIGH (ref 70–99)
Glucose-Capillary: 106 mg/dL — ABNORMAL HIGH (ref 70–99)
Glucose-Capillary: 111 mg/dL — ABNORMAL HIGH (ref 70–99)
Glucose-Capillary: 109 mg/dL — ABNORMAL HIGH (ref 70–99)
Glucose-Capillary: 162 mg/dL — ABNORMAL HIGH (ref 70–99)
Glucose-Capillary: 137 mg/dL — ABNORMAL HIGH (ref 70–99)

## 2023-01-15 LAB — VITAMIN B1: Vitamin B1 (Thiamine): 155.7 nmol/L (ref 66.5–200.0)

## 2023-01-15 MED ORDER — DEXTROSE-SODIUM CHLORIDE 5-0.9 % IV SOLN: INTRAVENOUS | Status: DC

## 2023-01-15 MED ORDER — DEXTROSE 50 % IV SOLN
INTRAVENOUS | Status: AC
  Administered 2023-01-15: 50 mL
  Filled 2023-01-15: qty 50

## 2023-01-15 MED ORDER — SENNA 8.6 MG PO TABS
1.0000 | ORAL_TABLET | Freq: Every day | ORAL | Status: DC | PRN
  Administered 2023-01-15: 8.6 mg

## 2023-01-15 MED ORDER — SULFAMETHOXAZOLE-TRIMETHOPRIM 800-160 MG PO TABS
1.0000 | ORAL_TABLET | Freq: Two times a day (BID) | ORAL | Status: DC
  Administered 2023-01-15 – 2023-01-19 (×9): 1
  Filled 2023-01-15 (×10): qty 1

## 2023-01-15 MED ORDER — CIPROFLOXACIN IN D5W 400 MG/200ML IV SOLN
400.0000 mg | Freq: Two times a day (BID) | INTRAVENOUS | Status: DC
  Filled 2023-01-15: qty 200

## 2023-01-15 NOTE — Progress Notes (Signed)
PROGRESS NOTE    James Hardester Sr.  RUE:[id number] DOB: [birth year] DOA: 12/15/2022 PCP: Blane Ohara, MD   Brief Narrative: 89 year old with past medical history significant for A-fib, known SVT, ischemic cardiomyopathy (non-STEMI status post LAD stent 2022) heart failure reduced ejection fraction (left ventricular ejection fraction improved to 55 on 2022 from 35%), hypertension, COPD, hypothyroidism, chronic anemia, frequent falls presents AT/8 after a fall found to have a traumatic subarachnoid hemorrhage.  Hospitalization has been complicated by E. coli UTI, asymptomatic runs of A-fib and PVC for which cardiology was consulted, acute urinary retention for which urology was consulted, placed urinary catheter 8/12, dysphagia for which core track has been placed and persistent encephalopathy.  Awaiting placement.    Assessment & Plan:   Principal Problem:   Subarachnoid hemorrhage (HCC) Active Problems:   Frequent falls   Acute metabolic encephalopathy   Frequent PVCs   PAF (paroxysmal atrial fibrillation) (HCC)   Chronic HFrEF with improved EF(heart failure with reduced ejection fraction) (HCC)   Ischemic cardiomyopathy   SIRS (systemic inflammatory response syndrome) (HCC)   CAD S/P LAD stent 2022   History of recent fall   Essential hypertension   Acquired hypothyroidism   Mixed conductive and sensorineural hearing loss, bilateral   Moderate persistent asthma, uncomplicated   Anemia   At high risk for bleeding   Hypokalemia   Protein-calorie malnutrition, severe   Urinary retention  1-Traumatic subarachnoid hemorrhage/subdural hematoma: -CT 8/13 mentioned vasogenic edema and expected redistribution of contusions.  Repeat CT scan 8/26 shows improvement in size of SDH. Still encephalopathic.  Had peg tube placed 9/03. Tube feeding held, because peg was leaking. Plan to connect tube to suction,   Recurrent UTI:  Fever, persistent leukocytosis Recurrent fevers and UTI  during this hospitalization Recently completed 7 days of ceftriaxone for E. coli UTI Fever 2 days ago and restarted on IV ceftriaxone. Urine culture growing enterobacter and E coli. Started on Bactrim. Will need 7-10 day Tx Foley catheter exchange 9/05. Will change to IV Cipro while he is having issues with PEG>   TBI with acute encephalopathy Alert, aphasic. No significant improvement during this admission.  B 12 500, normal.  Thiamine level pending.  Started  thiamine.   Protein caloric malnutrition/dysphagia Family agree with PEG tube placement. Started on tube feeding.   PEG tube placement 9/3; he was leaking last night. IR consulted. Dr watts recommended to place tube to low suction for couple of hours.   E. coli UTI: Completed ceftriaxone previously.   Acute urinary retention, BPH, prostate cancer status post brachytherapy Neurology was consulted Dr. Benita Gutter over a wire.  He will need catheter until he is more mobile. Will likely need to go to SNF with catheter Needs to follow up with urology for Voiding trial Foley catheter exchange 9/05/. He will need foley exchange every 30 days.   PAF w/RVR, frequent PVCs, hx NSVT Heart rate is better.  Continue metoprolol.   Holding full dose anticoagulation due to fall risk and current SAH, SDH. Not a good candidate for Watchman device.   CAD s/p LAD stent 2022 Elevated troponin is mild at 32, flat, without angina consistent with demand myocardial ischemia.  Continue beta blocker, statin. Not on antiplatelet due to bleeding.    Chronic HFrEF with improved EF (35>55%, 2022): Continue metoprolol.   Remains euvolemic.   Hypokalemia Resolved.   Hyponatremia:  Stable.  Continue to monitor.  will give low rate IV fluid  Hypophosphatemia:  Resolved  Chronic normocytic anemia:     Moderate persistent asthma, uncomplicated No exacerbation. - Continue DuoNebs.   Acquired hypothyroidism TSH is 6.177, free T4 is normal.  -  Continue levothyroxine 175 mcg daily   Essential hypertension Continue with metoprolol/    Debility, deconditioning, frailty -needs rehab.    Constipation -bowel regimen PRN      Nutrition Problem: Severe Malnutrition Etiology: chronic illness (COPD, HFrEF)    Signs/Symptoms: severe fat depletion, severe muscle depletion    Interventions: Tube feeding, Refer to RD note for recommendations  Estimated body mass index is 17.31 kg/m as calculated from the following:   Height as of this encounter: 6' (1.829 m).   Weight as of this encounter: 57.9 kg.   DVT prophylaxis: heparin  Code Status: Full code Family Communication: care discussed with daughter at bedside. .  Disposition Plan:  Status is: Inpatient Remains inpatient appropriate because:Discharge tomorrow.     Consultants:  Neurosurgery   Procedures:    Antimicrobials:    Subjective: Alert  Objective: Vitals:   01/15/23 0300 01/15/23 0700 01/15/23 0800 01/15/23 1200  BP: (!) 103/57  110/70 127/72  Pulse: 75  72 87  Resp: 16  17 20   Temp: (!) 97.3 F (36.3 C)  98.1 F (36.7 C) 97.7 F (36.5 C)  TempSrc: Oral  Oral Axillary  SpO2: 98%  99% 98%  Weight:  57.9 kg    Height:        Intake/Output Summary (Last 24 hours) at 01/15/2023 1318 Last data filed at 01/15/2023 0809 Gross per 24 hour  Intake 5561.76 ml  Output 2750 ml  Net 2811.76 ml   Filed Weights   01/10/23 0253 01/14/23 0713 01/15/23 0700  Weight: 54.1 kg 59.1 kg 57.9 kg    Examination:  General exam: NAD Respiratory system: CTA Cardiovascular system: S 1, S 2 RRR Gastrointestinal system: BS present, soft, nt Peg tube placed Central nervous system: Alert Extremities: no edema   Data Reviewed: I have personally reviewed following labs and imaging studies  CBC: Recent Labs  Lab 01/09/23 0430 01/10/23 0721 01/11/23 0528 01/12/23 1138 01/14/23 0820  WBC 13.2* 10.8* 12.5* 11.1* 10.5  HGB 10.9* 9.9* 11.3* 10.9* 10.9*   HCT 34.3* 30.3* 35.3* 33.5* 34.4*  MCV 97.2 94.7 95.4 97.4 95.0  PLT 346 310 343 310 301   Basic Metabolic Panel: Recent Labs  Lab 01/09/23 0430 01/10/23 0721 01/11/23 0528 01/12/23 1138 01/14/23 0820  NA 135 130* 133* 134* 131*  K 5.3* 4.0 3.9 4.9 4.6  CL 105 100 101 100 102  CO2 24 21* 20* 24 23  GLUCOSE 137* 99 121* 117* 109*  BUN 24* 18 18 23  26*  CREATININE 0.72 0.60* 0.62 0.65 0.73  CALCIUM 8.5* 8.1* 8.5* 8.4* 8.1*   GFR: Estimated Creatinine Clearance: 51.3 mL/min (by C-G formula based on SCr of 0.73 mg/dL). Liver Function Tests: No results for input(s): "AST", "ALT", "ALKPHOS", "BILITOT", "PROT", "ALBUMIN" in the last 168 hours. No results for input(s): "LIPASE", "AMYLASE" in the last 168 hours. No results for input(s): "AMMONIA" in the last 168 hours. Coagulation Profile: No results for input(s): "INR", "PROTIME" in the last 168 hours.  Cardiac Enzymes: No results for input(s): "CKTOTAL", "CKMB", "CKMBINDEX", "TROPONINI" in the last 168 hours. BNP (last 3 results) Recent Labs    06/30/22 1210  PROBNP 810*   HbA1C: No results for input(s): "HGBA1C" in the last 72 hours. CBG: Recent Labs  Lab 01/14/23 2358 01/15/23 0327 01/15/23 4132 01/15/23 4401  01/15/23 1224  GLUCAP 131* 61* 150* 106* 111*   Lipid Profile: No results for input(s): "CHOL", "HDL", "LDLCALC", "TRIG", "CHOLHDL", "LDLDIRECT" in the last 72 hours. Thyroid Function Tests: No results for input(s): "TSH", "T4TOTAL", "FREET4", "T3FREE", "THYROIDAB" in the last 72 hours. Anemia Panel: No results for input(s): "VITAMINB12", "FOLATE", "FERRITIN", "TIBC", "IRON", "RETICCTPCT" in the last 72 hours.  Sepsis Labs: Recent Labs  Lab 01/10/23 0721  PROCALCITON 0.13    Recent Results (from the past 240 hour(s))  Culture, Urine (Do not remove urinary catheter, catheter placed by urology or difficult to place)     Status: Abnormal   Collection Time: 01/09/23 11:15 AM   Specimen: Urine,  Catheterized  Result Value Ref Range Status   Specimen Description URINE, CATHETERIZED  Final   Special Requests   Final    NONE Performed at [facility], [address], [place], [place] [address]    Culture (A)  Final    >=100,000 COLONIES/mL ESCHERICHIA COLI >=100,000 COLONIES/mL ENTEROBACTER CLOACAE    Report Status 01/11/2023 FINAL  Final   Organism ID, Bacteria ESCHERICHIA COLI (A)  Final   Organism ID, Bacteria ENTEROBACTER CLOACAE (A)  Final      Susceptibility   Enterobacter cloacae - MIC*    CEFEPIME 2 SENSITIVE Sensitive     CIPROFLOXACIN <=0.25 SENSITIVE Sensitive     GENTAMICIN <=1 SENSITIVE Sensitive     IMIPENEM <=0.25 SENSITIVE Sensitive     NITROFURANTOIN 64 INTERMEDIATE Intermediate     TRIMETH/SULFA <=20 SENSITIVE Sensitive     PIP/TAZO >=128 RESISTANT Resistant     * >=100,000 COLONIES/mL ENTEROBACTER CLOACAE   Escherichia coli - MIC*    AMPICILLIN >=32 RESISTANT Resistant     CEFAZOLIN <=4 SENSITIVE Sensitive     CEFEPIME <=0.12 SENSITIVE Sensitive     CEFTRIAXONE <=0.25 SENSITIVE Sensitive     CIPROFLOXACIN <=0.25 SENSITIVE Sensitive     GENTAMICIN <=1 SENSITIVE Sensitive     IMIPENEM <=0.25 SENSITIVE Sensitive     NITROFURANTOIN <=16 SENSITIVE Sensitive     TRIMETH/SULFA <=20 SENSITIVE Sensitive     AMPICILLIN/SULBACTAM 16 INTERMEDIATE Intermediate     PIP/TAZO <=4 SENSITIVE Sensitive     * >=100,000 COLONIES/mL ESCHERICHIA COLI  Urine Culture (for pregnant, neutropenic or urologic patients or patients with an indwelling urinary catheter)     Status: Abnormal   Collection Time: 01/12/23 12:06 PM   Specimen: Urine, Catheterized  Result Value Ref Range Status   Specimen Description URINE, CATHETERIZED  Final   Special Requests NONE  Final   Culture (A)  Final    <10,000 COLONIES/mL INSIGNIFICANT GROWTH Performed at [facility], [address] [place], [place] [address]    Report Status 01/13/2023 FINAL  Final         Radiology  Studies: DG Abd 1 View  Result Date: 01/15/2023 CLINICAL DATA:  Complaint associated with gastric tube EXAM: ABDOMEN - 1 VIEW COMPARISON:  Percutaneous gastrostomy tube placement images [id number]; abdominal radiographs 01/06/2023 FINDINGS: Balloon retention percutaneous gastrostomy tube projects over the expected location. In the absence of intravenous contrast, intraluminal placement cannot be confirmed. No evidence of bowel obstruction. Previously ingested oral contrast material present throughout the colon. Brachytherapy seeds overlying the prostate gland. A catheter overlies the pelvis, presumably within the bladder. No acute osseous abnormality. IMPRESSION: 1. Balloon retention gastrostomy tube projects over the expected location. If confirmation of intragastric location is desired, recommend injecting water-soluble contrast through the tube and repeating the abdominal radiograph.  2. Previously ingested oral contrast material visible throughout the colon. 3. No evidence of bowel obstruction or large free air. Electronically Signed   By: Malachy Moan M.D.   On: 01/15/2023 07:09        Scheduled Meds:  Chlorhexidine Gluconate Cloth  6 each Topical Daily   docusate  100 mg Per Tube BID   famotidine  40 mg Per Tube Daily   feeding supplement (PROSource TF20)  60 mL Per Tube Daily   free water  100 mL Per Tube Q12H   heparin injection (subcutaneous)  5,000 Units Subcutaneous Q12H   levothyroxine  175 mcg Per Tube Q0600   loratadine  10 mg Per Tube Daily   metoprolol tartrate  25 mg Per Tube BID   neomycin-bacitracin-polymyxin   Topical Daily   mouth rinse  15 mL Mouth Rinse 4 times per day   sulfamethoxazole-trimethoprim  1 tablet Per Tube Q12H   terbinafine  250 mg Per Tube Daily   thiamine  100 mg Per Tube Daily   Continuous Infusions:  sodium chloride 10 mL/hr at 01/10/23 1344   dextrose 5 % and 0.9 % NaCl 50 mL/hr at 01/15/23 0810   feeding supplement (OSMOLITE 1.2 CAL) 65 mL/hr at  01/15/23 0809     LOS: 30 days    Time spent: 35 minutes    Levar Fayson A Angala Hilgers, MD [facility]   If 7PM-7AM, please contact night-coverage [email or web address]  01/15/2023, 1:18 PM

## 2023-01-15 NOTE — Progress Notes (Signed)
Referring Physician(s): Pokhrel,Laxman  Supervising Physician: Simonne Come  Patient Status:  [name] - In-pt  Chief Complaint: Subarachnoid hemorrhage  Subjective: Concern for G-tube leaking overnight.  Family at bedside without concern or complaint.  Per RN, no leaking noted on shift today.   Allergies: Clarithromycin  Medications: Prior to Admission medications   Medication Sig Start Date End Date Taking? Authorizing Provider  albuterol (VENTOLIN HFA) 108 (90 Base) MCG/ACT inhaler Inhale 2 puffs into the lungs every 6 (six) hours as needed for wheezing or shortness of breath. 10/12/22  Yes Cox, Kirsten, MD  aspirin 81 MG EC tablet Take 1 tablet (81 mg total) by mouth daily. Swallow whole. 06/16/21  Yes Georgeanna Lea, MD  ferrous sulfate (FERROUSUL) 325 (65 FE) MG tablet Take 1 tablet (325 mg total) by mouth daily with breakfast. 10/12/22  Yes Cox, Kirsten, MD  fexofenadine (ALLEGRA) 180 MG tablet Take 180 mg by mouth daily.   Yes [provider]  ipratropium-albuterol (DUONEB) 0.5-2.5 (3) MG/3ML SOLN Take 3 mLs by nebulization every 4 (four) hours as needed (shortness of breath or wheezing). 09/22/21  Yes Abigail Miyamoto, MD  levothyroxine (SYNTHROID) 175 MCG tablet TAKE ONE TABLET BY MOUTH BEFORE BREAKFAST 12/06/22  Yes Cox, Kirsten, MD  Nutritional Supplements (BOOST NUTRITIONAL ENERGY PO) Take 237 mLs by mouth in the morning and at bedtime. Drinking 1-2 per day   Yes [provider]  terbinafine (LAMISIL) 250 MG tablet Take 1 tablet (250 mg total) by mouth daily. 11/17/22  Yes McCaughan, Dia D, DPM  atorvastatin (LIPITOR) 80 MG tablet Take 0.5 tablets (40 mg total) by mouth every morning. 01/13/23   Regalado, Belkys A, MD  famotidine (PEPCID) 40 MG/5ML suspension Take 5 mLs (40 mg total) by mouth daily. 01/14/23   Regalado, Belkys A, MD  metoprolol succinate (TOPROL-XL) 50 MG 24 hr tablet TAKE ONE TABLET BY MOUTH EVERY MORNING WITH OR immediately following A  meal 12/19/22   Cox, Kirsten, MD  metoprolol tartrate (LOPRESSOR) 25 MG tablet Place 1 tablet (25 mg total) into feeding tube 2 (two) times daily. 01/13/23   Regalado, Belkys A, MD  Multiple Vitamins-Minerals (MULTIVITAMIN ADULT PO) Take 1 tablet by mouth daily. Unknown strength Patient not taking: Reported on 12/15/2022    [provider]  Nutritional Supplements (FEEDING SUPPLEMENT, OSMOLITE 1.2 CAL,) LIQD Place 1,000 mLs into feeding tube continuous. 01/13/23   Regalado, Belkys A, MD  Protein (FEEDING SUPPLEMENT, PROSOURCE TF20,) liquid Place 60 mLs into feeding tube daily. 01/14/23   Regalado, Belkys A, MD  sulfamethoxazole-trimethoprim (BACTRIM DS) 800-160 MG tablet Place 1 tablet into feeding tube every 12 (twelve) hours for 7 days. 01/13/23 01/20/23  Regalado, Belkys A, MD  thiamine (VITAMIN B-1) 100 MG tablet Place 1 tablet (100 mg total) into feeding tube daily. 01/14/23   Regalado, Prentiss Bells, MD  Water For Irrigation, Sterile (FREE WATER) SOLN Place 100 mLs into feeding tube every 12 (twelve) hours. 01/13/23   Regalado, Jon Billings A, MD     Vital Signs: BP 127/72 (BP Location: Left Arm)   Pulse 87   Temp 97.7 F (36.5 C) (Axillary)   Resp 20   Ht 6' (1.829 m)   Wt 127 lb 10.3 oz (57.9 kg)   SpO2 98%   BMI 17.31 kg/m   Physical Exam NAD, alert Abdomen: G-tube in place.  No leaking noted at site or on dressing.  Tube intact.  1 t-tac remains in place.  Flushed both ports without issue.  Imaging: DG Abd 1 View  Result Date: 01/15/2023 CLINICAL DATA:  Complaint associated with gastric tube EXAM: ABDOMEN - 1 VIEW COMPARISON:  Percutaneous gastrostomy tube placement images [id number]; abdominal radiographs 01/06/2023 FINDINGS: Balloon retention percutaneous gastrostomy tube projects over the expected location. In the absence of intravenous contrast, intraluminal placement cannot be confirmed. No evidence of bowel obstruction. Previously ingested oral contrast material present throughout the colon.  Brachytherapy seeds overlying the prostate gland. A catheter overlies the pelvis, presumably within the bladder. No acute osseous abnormality. IMPRESSION: 1. Balloon retention gastrostomy tube projects over the expected location. If confirmation of intragastric location is desired, recommend injecting water-soluble contrast through the tube and repeating the abdominal radiograph. 2. Previously ingested oral contrast material visible throughout the colon. 3. No evidence of bowel obstruction or large free air. Electronically Signed   By: Malachy Moan M.D.   On: 01/15/2023 07:09    Labs:  CBC: Recent Labs    01/10/23 0721 01/11/23 0528 01/12/23 1138 01/14/23 0820  WBC 10.8* 12.5* 11.1* 10.5  HGB 9.9* 11.3* 10.9* 10.9*  HCT 30.3* 35.3* 33.5* 34.4*  PLT 310 343 310 301    COAGS: Recent Labs    12/17/22 2025 01/05/23 0904  INR 1.1 1.0    BMP: Recent Labs    01/10/23 0721 01/11/23 0528 01/12/23 1138 01/14/23 0820  NA 130* 133* 134* 131*  K 4.0 3.9 4.9 4.6  CL 100 101 100 102  CO2 21* 20* 24 23  GLUCOSE 99 121* 117* 109*  BUN 18 18 23  26*  CALCIUM 8.1* 8.5* 8.4* 8.1*  CREATININE 0.60* 0.62 0.65 0.73  GFRNONAA >60 >60 >60 >60    LIVER FUNCTION TESTS: Recent Labs    07/08/22 1154 12/15/22 1840 12/17/22 2025  BILITOT <0.2 0.3 0.7  AST 24 25 20   ALT 21 21 18   ALKPHOS 62 49 49  PROT 6.8 6.5 6.8  ALBUMIN 4.4 3.4* 3.0*    Assessment and Plan: Leaking G-tube S/p G-tube placement 9/3, with concern for leaking overnight.  No leaking noted on exam today. Dressing clean and dry.  Balloon intact or at least non-moveable.  Bumper cinched appropriately.  Flushed both ports without issue.  G-tube ready for use.  RN aware.   Electronically Signed: Hoyt Koch, PA 01/15/2023, 2:29 PM   I spent a total of 15 Minutes at the the patient's bedside AND on the patient's hospital floor or unit, greater than 50% of which was counseling/coordinating care for  malnutrition, leaking G-tube.

## 2023-01-15 NOTE — Progress Notes (Addendum)
Hypoglycemic Event  CBG: 61  Treatment:50% dextrose Iv half dose given  Symptoms: None  Follow-up CBG: Time:355  CBG Result:150  Possible Reasons for Event: Other: feeding stopped checking peg placement  Comments/MD notified:yes    Hermine Messick

## 2023-01-15 NOTE — Progress Notes (Signed)
Pt feeding restarted this evening after IR PA and MD approval to use. Pt tolerated his feeds with no problem or leakage noted or assessed. PEG site remains unremarkable with dsg and abdominal binder intact. Pt feeding increased to 55 ml/hr; pt resting comfortably in bed with call light within reach. Report given to oncoming RN to continue to assess and increase feeding rate as tolerated to goal of 65 ml/hr. Dionne Bucy RN

## 2023-01-16 ENCOUNTER — Ambulatory Visit: Payer: Medicare HMO | Admitting: Family Medicine

## 2023-01-16 DIAGNOSIS — I609 Nontraumatic subarachnoid hemorrhage, unspecified: Secondary | ICD-10-CM | POA: Diagnosis not present

## 2023-01-16 LAB — GLUCOSE, CAPILLARY
Glucose-Capillary: 109 mg/dL — ABNORMAL HIGH (ref 70–99)
Glucose-Capillary: 122 mg/dL — ABNORMAL HIGH (ref 70–99)
Glucose-Capillary: 129 mg/dL — ABNORMAL HIGH (ref 70–99)
Glucose-Capillary: 134 mg/dL — ABNORMAL HIGH (ref 70–99)
Glucose-Capillary: 135 mg/dL — ABNORMAL HIGH (ref 70–99)
Glucose-Capillary: 141 mg/dL — ABNORMAL HIGH (ref 70–99)

## 2023-01-16 NOTE — Discharge Summary (Signed)
Physician Discharge Summary   Patient: James Beymer Sr. MRN: [id number] DOB: [birth year]  Admit date:     12/15/2022  Discharge date: 01/16/23  Discharge Physician: Alba Cory   PCP: Blane Ohara, MD   Recommendations at discharge:   Needs to follow up with Dr Ave Filter, neurosurgery in 1 week Needs to follow up with Urology Continue with intensive speech therapy, PT, OT>    Discharge Diagnoses: Principal Problem:   Subarachnoid hemorrhage (HCC) Active Problems:   Frequent falls   Acute metabolic encephalopathy   Frequent PVCs   PAF (paroxysmal atrial fibrillation) (HCC)   Chronic HFrEF with improved EF(heart failure with reduced ejection fraction) (HCC)   Ischemic cardiomyopathy   SIRS (systemic inflammatory response syndrome) (HCC)   CAD S/P LAD stent 2022   History of recent fall   Essential hypertension   Acquired hypothyroidism   Mixed conductive and sensorineural hearing loss, bilateral   Moderate persistent asthma, uncomplicated   Anemia   At high risk for bleeding   Hypokalemia   Protein-calorie malnutrition, severe   Urinary retention  Resolved Problems:   * No resolved hospital problems. *  Hospital Course: 89 year old with past medical history significant for A-fib, known SVT, ischemic cardiomyopathy (non-STEMI status post LAD stent 2022) heart failure reduced ejection fraction (left ventricular ejection fraction improved to 55 on 2022 from 35%), hypertension, COPD, hypothyroidism, chronic anemia, frequent falls presents AT/8 after a fall found to have a traumatic subarachnoid hemorrhage.   Hospitalization has been complicated by E. coli UTI, asymptomatic runs of A-fib and PVC for which cardiology was consulted, acute urinary retention for which urology was consulted, placed urinary catheter 8/12, dysphagia for which core track has been placed and persistent encephalopathy.  Assessment and Plan: 1-Traumatic subarachnoid hemorrhage/subdural  hematoma: -CT 8/13 mentioned vasogenic edema and expected redistribution of contusions.  Repeat CT scan 8/26 shows improvement in size of SDH. Still encephalopathic.  Had peg tube placed 9/03. Tolerating tube feeding.  Follow up with Neurosurgery in regard resumption of aspirin. Due to [facility] and falls.   Recurrent UTI:  Fever, persistent leukocytosis Recurrent fevers and UTI during this hospitalization Recently completed 7 days of ceftriaxone for E. coli UTI Fever 2 days ago and restarted on IV ceftriaxone. Urine culture growing enterobacter and E coli. Started on Bactrim. Will need 7-10 day Tx Foley catheter exchange 9/05. WBC normalized.   TBI with acute encephalopathy Alert, aphasic. No significant improvement during this admission.  B 12 500, normal.  Thiamine level pending.  Started  thiamine.    Protein caloric malnutrition/dysphagia Family agree with PEG tube placement. Started on tube feeding.    PEG tube placement 9/3; tolerating tube feeding.  No further peg tube leakage.   E. coli UTI: completed ceftriaxone previously.    Acute urinary retention, BPH, prostate cancer status post brachytherapy Neurology was consulted Dr. Benita Gutter over a wire.  He will need catheter until he is more mobile. Will likely need to go to SNF with catheter Needs to follow up with urology for Voiding trial Foley catheter exchange 9/05/. He will need foley exchange every 30 days.    PAF w/RVR, frequent PVCs, hx NSVT Heart rate is better.  Continue metoprolol.   Holding full dose anticoagulation due to fall risk and current SAH, SDH. Not a good candidate for Watchman device.   CAD s/p LAD stent 2022 Elevated troponin is mild at 32, flat, without angina consistent with demand myocardial ischemia.  Continue beta blocker, statin. Not on  antiplatelet due to bleeding.    Chronic HFrEF with improved EF (35>55%, 2022): Continue metoprolol.   Remains euvolemic.   Hypokalemia Resolved.    Hyponatremia:  Stable.  Continue to monitor.   Hypophosphatemia:  Resolved    Chronic normocytic anemia:      Moderate persistent asthma, uncomplicated No exacerbation. - Continue DuoNebs.   Acquired hypothyroidism TSH is 6.177, free T4 is normal.  - Continue levothyroxine 175 mcg daily   Essential hypertension Continue with metoprolol/    Debility, deconditioning, frailty -needs rehab.    Constipation -bowel regimen PRN     Stable awaiting rehab.       Consultants: Neurosurgery  Procedures performed: peg tube placement.  Disposition: Skilled nursing facility Diet recommendation:  Discharge Diet Orders (From admission, onward)     Start     Ordered   01/13/23 0000  Diet - low sodium heart healthy        01/13/23 1024           Diet: NPO, on tube feeding.  DISCHARGE MEDICATION: Allergies as of 01/16/2023       Reactions   Clarithromycin    REACTION: GI upset        Medication List     STOP taking these medications    aspirin EC 81 MG tablet   BOOST NUTRITIONAL ENERGY PO Replaced by: feeding supplement (OSMOLITE 1.2 CAL) Liqd   metoprolol succinate 50 MG 24 hr tablet Commonly known as: TOPROL-XL   terbinafine 250 MG tablet Commonly known as: LAMISIL       TAKE these medications    albuterol 108 (90 Base) MCG/ACT inhaler Commonly known as: VENTOLIN HFA Inhale 2 puffs into the lungs every 6 (six) hours as needed for wheezing or shortness of breath.   atorvastatin 80 MG tablet Commonly known as: LIPITOR Take 0.5 tablets (40 mg total) by mouth every morning. What changed:  how much to take when to take this   famotidine 40 MG/5ML suspension Commonly known as: PEPCID Take 5 mLs (40 mg total) by mouth daily.   feeding supplement (OSMOLITE 1.2 CAL) Liqd Place 1,000 mLs into feeding tube continuous. Replaces: BOOST NUTRITIONAL ENERGY PO   feeding supplement (PROSource TF20) liquid Place 60 mLs into feeding tube daily.    ferrous sulfate 325 (65 FE) MG tablet Commonly known as: FerrouSul Take 1 tablet (325 mg total) by mouth daily with breakfast.   fexofenadine 180 MG tablet Commonly known as: ALLEGRA Take 180 mg by mouth daily.   free water Soln Place 100 mLs into feeding tube every 12 (twelve) hours.   ipratropium-albuterol 0.5-2.5 (3) MG/3ML Soln Commonly known as: DUONEB Take 3 mLs by nebulization every 4 (four) hours as needed (shortness of breath or wheezing).   levothyroxine 175 MCG tablet Commonly known as: SYNTHROID TAKE ONE TABLET BY MOUTH BEFORE BREAKFAST   metoprolol tartrate 25 MG tablet Commonly known as: LOPRESSOR Place 1 tablet (25 mg total) into feeding tube 2 (two) times daily.   MULTIVITAMIN ADULT PO Take 1 tablet by mouth daily. Unknown strength   sulfamethoxazole-trimethoprim 800-160 MG tablet Commonly known as: BACTRIM DS Place 1 tablet into feeding tube every 12 (twelve) hours for 7 days.   thiamine 100 MG tablet Commonly known as: Vitamin B-1 Place 1 tablet (100 mg total) into feeding tube daily.         Discharge Exam: Filed Weights   01/14/23 0713 01/15/23 0700 01/16/23 0305  Weight: 59.1 kg 57.9 kg 54.7 kg  General; NAD  Condition at discharge: stable  The results of significant diagnostics from this hospitalization (including imaging, microbiology, ancillary and laboratory) are listed below for reference.   Imaging Studies: DG Abd 1 View  Result Date: 01/15/2023 CLINICAL DATA:  Complaint associated with gastric tube EXAM: ABDOMEN - 1 VIEW COMPARISON:  Percutaneous gastrostomy tube placement images 9324; abdominal radiographs 01/06/2023 FINDINGS: Balloon retention percutaneous gastrostomy tube projects over the expected location. In the absence of intravenous contrast, intraluminal placement cannot be confirmed. No evidence of bowel obstruction. Previously ingested oral contrast material present throughout the colon. Brachytherapy seeds overlying the  prostate gland. A catheter overlies the pelvis, presumably within the bladder. No acute osseous abnormality. IMPRESSION: 1. Balloon retention gastrostomy tube projects over the expected location. If confirmation of intragastric location is desired, recommend injecting water-soluble contrast through the tube and repeating the abdominal radiograph. 2. Previously ingested oral contrast material visible throughout the colon. 3. No evidence of bowel obstruction or large free air. Electronically Signed   By: Malachy Moan M.D.   On: 01/15/2023 07:09   IR GASTROSTOMY TUBE MOD SED  Result Date: 01/10/2023 INDICATION: 89 year old male with history of stroke and failure to thrive presenting for percutaneous gastrostomy tube placement. EXAM: PERC PLACEMENT GASTROSTOMY MEDICATIONS: The patient was currently receiving intravenous antibiotics as an inpatient. ANESTHESIA/SEDATION: Versed 1 mg IV; Fentanyl 50 mcg IV Moderate Sedation Time:  12 The patient was continuously monitored during the procedure by the interventional radiology nurse under my direct supervision. CONTRAST:  10mL OMNIPAQUE IOHEXOL 300 MG/ML SOLN - administered into the gastric lumen. FLUOROSCOPY TIME:  One mGy COMPLICATIONS: None immediate. PROCEDURE: Informed written consent was obtained from the patient after a thorough discussion of the procedural risks, benefits and alternatives. All questions were addressed. Maximal Sterile barrier Technique was utilized including caps, mask, sterile gowns, sterile gloves, sterile drape, hand hygiene and skin antiseptic. A timeout was performed prior to the initiation of the procedure. The patient was placed on the procedure table in the supine position. Pre-procedure abdominal film confirmed visualization of the transverse colon. The patient was prepped and draped in usual sterile fashion. The stomach was insufflated with air via the indwelling nasogastric tube. Under fluoroscopy, a puncture site was selected and  local analgesia achieved with 1% lidocaine infiltrated subcutaneously. Under fluoroscopic guidance, a gastropexy needle was passed into the stomach and the T-bar suture was released. Entry into the stomach was confirmed with fluoroscopy, aspiration of air, and injection of contrast material. This was repeated with an additional gastropexy suture (for a total of 2 fasteners). At the center of these gastropexy sutures, a dermatotomy was performed. An 18 gauge needle was passed into the stomach at the site of this dermatotomy, and position within the gastric lumen again confirmed under fluoroscopy using aspiration of air and contrast injection. An Amplatz guidewire was passed through this needle and intraluminal placement within the stomach was confirmed by fluoroscopy. The needle was removed. Over the guidewire, the percutaneous tract was dilated using a 10 mm non-compliant balloon. The balloon was deflated, then pushed into the gastric lumen followed in concert by the 20 Fr gastrostomy tube. The retention balloon of the percutaneous gastrostomy tube was inflated with 20 mL of sterile water. The tube was withdrawn until the retention balloon was at the edge of the gastric lumen. The external bumper was brought to the abdominal wall. Contrast was injected through the gastrostomy tube, confirming intraluminal positioning. The patient tolerated the procedure well without any immediate post-procedural complications.  IMPRESSION: Technically successful placement of 20 Fr gastrostomy tube. Marliss Coots, MD [facility] Rad[facility] Electronically Signed   By: Marliss Coots M.D.   On: 01/10/2023 09:41   DG CHEST PORT 1 VIEW  Result Date: 01/09/2023 CLINICAL DATA:  Fever EXAM: PORTABLE CHEST 1 VIEW COMPARISON:  01/01/2023 FINDINGS: Enteric tube extends into the stomach, beyond the inferior margin of the film. Stable heart size. Aortic atherosclerosis. Coronary artery stent. No  focal airspace consolidation, pleural effusion, or pneumothorax. IMPRESSION: No acute cardiopulmonary findings Electronically Signed   By: Duanne Guess D.O.   On: 01/09/2023 13:09   DG Abd Portable 1V  Result Date: 01/06/2023 CLINICAL DATA:  Encounter for feeding tube EXAM: PORTABLE ABDOMEN - 1 VIEW COMPARISON:  Abdominal CT from 2 days ago FINDINGS: Feeding tube with tip at the distal stomach. Oral contrast is seen within nondilated colon. Clear lung bases.  Coronary stenting. IMPRESSION: Feeding tube with tip at the distal stomach. Electronically Signed   By: Tiburcio Pea M.D.   On: 01/06/2023 12:52   CT ABDOMEN WO CONTRAST  Result Date: 01/05/2023 CLINICAL DATA:  dysphagia.  Preop planning for gastrostomy. EXAM: CT ABDOMEN WITHOUT CONTRAST TECHNIQUE: Multidetector CT imaging of the abdomen was performed following the standard protocol without IV contrast. RADIATION DOSE REDUCTION: This exam was performed according to the departmental dose-optimization program which includes automated exposure control, adjustment of the mA and/or kV according to patient size and/or use of iterative reconstruction technique. COMPARISON:  Swallow eval, 01/04/2023 and 12/28/2022. Chest XR, 01/01/2023. FINDINGS: Lower chest: 4.5 cm dilatation of the imaged ascending thoracic aorta. LAD stent. Severe burden of multivessel coronary atherosclerosis is present. Hepatobiliary: Normal noncontrast appearance of the liver without focal abnormality. Trace radiodense gallstones within the nondistended gallbladder. No gallbladder wall thickening, or biliary dilatation. Pancreas: No pancreatic ductal dilatation or surrounding inflammatory changes. Spleen: Normal in size without focal abnormality. Adrenals/Urinary Tract: Adrenal glands are unremarkable. Kidneys are normal, without renal calculi, focal lesion, or hydronephrosis. Stomach/Bowel: Stomach is nondistended and within normal limits. Small bore enteric feeding tube, with tip  projecting at the proximal duodenum. Intraluminal contrast opacification of the colon, previously ingested. Imaged portions of the small bowel and colon are nondistended. No evidence of bowel wall thickening, distention, or inflammatory changes. 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 Moderate-to-severe burden of aortic atherosclerosis without aneurysmal dilatation. No enlarged abdominal lymph nodes. Other: Scaphoid abdomen.  No intra-abdominal ascites Musculoskeletal: Degenerative changes spine, including mild S-shaped thoracolumbar curvature. No acute osseous findings. IMPRESSION: 1. No acute abdominal process. 2. No intervening viscera. Anatomy is amenable for percutaneous gastrostomy placement. Final decision making is reserved for the performing physician. 3. Incidental 4.5 cm ascending thoracic aortic dilatation, incompletely assessed. Ascending thoracic aortic aneurysm. Recommend semi-annual imaging followup by CTA or MRA and referral to cardiothoracic surgery if not already obtained This recommendation follows 2010 ACCF/AHA/AATS/ACR/ASA/SCA/SCAI/SIR/STS/SVM Guidelines for the Diagnosis and Management of Patients With Thoracic Aortic Disease. Circulation. 2010; 121: O175-Z025. Aortic aneurysm NOS (ICD10-I71.9) 4. Aortic Atherosclerosis (ICD10-I70.0). Additional incidental, chronic and senescent findings as above. Roanna Banning, MD [facility] Radiology Electronically Signed   By: Roanna Banning M.D.   On: 01/05/2023 12:03   DG Swallowing Func-Speech Pathology  Result Date: 01/04/2023 Table formatting from the original result was not included. Modified Barium Swallow Study Patient Details Name: James Seck Sr. MRN: [id number] Date of Birth: Jul 08, 1931 Today's Date: 01/04/2023 HPI/PMH: HPI: Pt is an 89 y.o. male who  presented with AMS after a fall. CT head: Scattered areas of subarachnoid hemorrhage in the left occipital  lobe,  bilateral high frontal lobes, and right temporal lobe. Small amount of subarachnoid hemorrhage posterior to the right midbrain extending into the cerebral aqueduct. CTH 8/26 with decreased size and density of previously seen multifocal SAH. PMH: COPD, prior NSTEMI / CHF, NSVT, PAF, on ASA81. Clinical Impression: Clinical Impression: Pt presents with a moderate oropharyngeal dysphagia characterized by deficits related to awareness and reduced strength. Feel that this is in part due to cognition, although also significantly affected by anatomical differences. Overall, pt with poor awareness of bolus presentations, leaving mouth wide open when presented with a spoon, straw, or cup and requiring Max cueing to close mouth and initiate. With larger boluses, there is posterior progression to the level of the pyriform sinuses before he initiates the swallow. He is consistently piecemeal swallowing which affects his overall coordination. Pt presents with reduced pharyngeal strength and suspected prominent cervical osteophytes create pharyngeal narrowing at the level of the valleculae, contributing to no noted epiglottic inversion. This may also be affected by Cortrak in place, although feel this is not an acute difference. Incomplete epiglottic inversion allows the laryngeal vestibule to be open during the swallowing contributing to penetration of all consistencies during the swallow as well as after. With thicker consistencies, pt has increased pharyngeal residue collecting along the base of tongue, valleculae, pharyngeal wall, and aryepiglottic folds. Due to pt's reduced sensation and strength, this residue progresses into the laryngeal vestibule when pt is at rest. Observed penetration to the level of the vocal folds without sensation and without ability to clear after a cued cough (PAS 5) with thin liquids, nectar thick liquids, and honey thick liquids. Pureed textures were penetrated above the vocal folds and were not  able to be expelled (PAS 3) with concern for continual progression through the trachea without sensation. Overall, feel that pt's cognition does not allow for consistent use of compensatory strategies at this time. Recommend he remain strictly NPO. Will continue to follow. Factors that may increase risk of adverse event in presence of aspiration Rubye Oaks & Clearance Coots 2021): Factors that may increase risk of adverse event in presence of aspiration Rubye Oaks & Clearance Coots 2021): Poor general health and/or compromised immunity; Reduced cognitive function; Frail or deconditioned; Dependence for feeding and/or oral hygiene; Inadequate oral hygiene Recommendations/Plan: Swallowing Evaluation Recommendations Swallowing Evaluation Recommendations Recommendations: NPO Medication Administration: Via alternative means Treatment Plan Treatment Plan Treatment recommendations: Therapy as outlined in treatment plan below Follow-up recommendations: Skilled nursing-short term rehab (<3 hours/day) Functional status assessment: Patient has had a recent decline in their functional status and demonstrates the ability to make significant improvements in function in a reasonable and predictable amount of time. Treatment frequency: Min 2x/week Treatment duration: 2 weeks Interventions: Aspiration precaution training; Trials of upgraded texture/liquids; Diet toleration management by SLP; Patient/family education Recommendations Recommendations for follow up therapy are one component of a multi-disciplinary discharge planning process, led by the attending physician.  Recommendations may be updated based on patient status, additional functional criteria and insurance authorization. Assessment: Orofacial Exam: Orofacial Exam Oral Cavity: Oral Hygiene: WFL Oral Cavity - Dentition: Poor condition; Missing dentition; Dentures, top; Dentures, not available Orofacial Anatomy: WFL Oral Motor/Sensory Function: WFL Anatomy: Anatomy: Suspected cervical  osteophytes Boluses Administered: Boluses Administered Boluses Administered: Thin liquids (Level 0); Mildly thick liquids (Level 2, nectar thick); Moderately thick liquids (Level 3, honey thick); Puree  Oral Impairment Domain: Oral Impairment Domain Lip Closure:  Escape from interlabial space or lateral juncture, no extension beyond vermillion border Tongue control during bolus hold: Posterior escape of greater than half of bolus Bolus transport/lingual motion: Delayed initiation of tongue motion (oral holding) Oral residue: Residue collection on oral structures Location of oral residue : Tongue; Palate Initiation of pharyngeal swallow : Pyriform sinuses  Pharyngeal Impairment Domain: Pharyngeal Impairment Domain Soft palate elevation: No bolus between soft palate (SP)/pharyngeal wall (PW) Laryngeal elevation: Complete superior movement of thyroid cartilage with complete approximation of arytenoids to epiglottic petiole Anterior hyoid excursion: Partial anterior movement Epiglottic movement: No inversion Laryngeal vestibule closure: Incomplete, narrow column air/contrast in laryngeal vestibule Pharyngeal stripping wave : Present - diminished Pharyngeal contraction (A/P view only): N/A Pharyngoesophageal segment opening: Minimal distention/minimal duration, marked obstruction of flow Tongue base retraction: Narrow column of contrast or air between tongue base and PPW Pharyngeal residue: Majority of contrast within or on pharyngeal structures Location of pharyngeal residue: Valleculae; Pharyngeal wall; Pyriform sinuses; Aryepiglottic folds  Esophageal Impairment Domain: No data recorded Pill: No data recorded Penetration/Aspiration Scale Score: Penetration/Aspiration Scale Score 3.  Material enters airway, remains ABOVE vocal cords and not ejected out: Puree 5.  Material enters airway, CONTACTS cords and not ejected out: Thin liquids (Level 0); Mildly thick liquids (Level 2, nectar thick); Moderately thick liquids  (Level 3, honey thick) Compensatory Strategies: Compensatory Strategies Compensatory strategies: Yes Straw: Ineffective Ineffective Straw: Thin liquid (Level 0)   General Information: Caregiver present: No  Diet Prior to this Study: NPO; Cortrak/Small bore NG tube   Temperature : Normal   Respiratory Status: WFL   Supplemental O2: None (Room air)   History of Recent Intubation: No  Behavior/Cognition: Alert; Cooperative; Confused; Requires cueing Self-Feeding Abilities: Dependent for feeding Baseline vocal quality/speech: Dysphonic; Hypophonia/low volume Volitional Cough: Able to elicit Volitional Swallow: Unable to elicit Exam Limitations: Excessive movement Goal Planning: Prognosis for improved oropharyngeal function: Fair Barriers to Reach Goals: Cognitive deficits; Severity of deficits; Time post onset; Overall medical prognosis No data recorded Patient/Family Stated Goal: none stated Consulted and agree with results and recommendations: Pt unable/family or caregiver not available Pain: Pain Assessment Pain Assessment: No/denies pain Breathing: 0 Negative Vocalization: 0 Facial Expression: 0 Body Language: 0 Consolability: 0 PAINAD Score: 0 End of Session: Start Time:SLP Start Time (ACUTE ONLY): 1121 Stop Time: SLP Stop Time (ACUTE ONLY): 1138 Time Calculation:SLP Time Calculation (min) (ACUTE ONLY): 17 min Charges: SLP Evaluations $ SLP Speech Visit: 1 Visit SLP Evaluations $MBS Swallow: 1 Procedure $Swallowing Treatment: 1 Procedure $Speech Treatment for Individual: 1 Procedure SLP visit diagnosis: SLP Visit Diagnosis: Dysphagia, oropharyngeal phase (R13.12); Cognitive communication deficit (R41.841) Past Medical History: Past Medical History: Diagnosis Date  Acquired hypothyroidism 08/15/2019  Acute systolic heart failure (HCC) 06/25/2020  Anemia 02/05/2020  boarderline anemic  Atopic dermatitis 12/05/2019  Bacterial pneumonia, unspecified 06/14/2012  06/14/2012 CXR w/ RML PNA >Zithromax and Rocephin    Bradycardia  11/28/2018  CAD (coronary artery disease) 07/07/2020  Cancer (HCC)   prostate cancer-seed implant  Closed fracture dislocation of right elbow   Closed fracture of right olecranon process 04/10/2020  COPD (chronic obstructive pulmonary disease) (HCC) 01/21/2020  Dermatitis 02/05/2020  Dizziness 02/19/2016  Essential hypertension 11/28/2018  History of prostate cancer   seed implant  Hyperlipemia   Hyperlipidemia, mixed 08/21/2008  Qualifier: Diagnosis of  By: Maple Hudson MD, Clinton D   LVH (left ventricular hypertrophy) 11/28/2018  Malnutrition of moderate degree (HCC) 08/15/2019  Mixed conductive and sensorineural hearing loss, 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  Moderate persistent  asthma, uncomplicated 01/21/2020  Nonsustained ventricular tachycardia (HCC) 01/30/2019  NSTEMI (non-ST elevated myocardial infarction) (HCC) 06/21/2020  Other fatigue 02/05/2020  PAF (paroxysmal atrial fibrillation) (HCC) 06/25/2020  Seasonal and perennial allergic rhinitis 05/17/2007     Senile osteoporosis 08/15/2019  SINUSITIS, ACUTE 06/28/2007  Qualifier: Diagnosis of  By: Clent Ridges NP, Tammy    Ventricular ectopy 11/28/2018 Past Surgical History: Past Surgical History: Procedure Laterality Date  APPENDECTOMY    CORONARY ATHERECTOMY N/A 06/24/2020  Procedure: CORONARY ATHERECTOMY;  Surgeon: Iran Ouch, MD;  [facility];  Service: Cardiovascular;  Laterality: N/A;  CORONARY ULTRASOUND/IVUS N/A 06/24/2020  Procedure: Intravascular Ultrasound/IVUS;  Surgeon: Iran Ouch, MD;  Location: [facility];  Service: Cardiovascular;  Laterality: N/A;  LEFT HEART CATH AND CORONARY ANGIOGRAPHY N/A 06/22/2020  Procedure: LEFT HEART CATH AND CORONARY ANGIOGRAPHY;  Surgeon: Yvonne Kendall, MD;  Location: [facility];  Service: Cardiovascular;  Laterality: N/A;  ORIF ELBOW FRACTURE Right 04/10/2020  Procedure: OPEN REDUCTION INTERNAL FIXATION (ORIF) ELBOW/OLECRANON FRACTURE;  Surgeon: Teryl Lucy, MD;  Location: [facility];  Service:  Orthopedics;  Laterality: Right; Gwynneth Aliment, M.A., CF-SLP Speech Language Pathology, Acute Rehabilitation Services Secure Chat preferred [phone] 01/04/2023, 1:45 PM  CT HEAD WO CONTRAST ( )  Result Date: 01/02/2023 CLINICAL DATA:  Mental status change, persistent or worsening CT EXAM: CT HEAD WITHOUT CONTRAST TECHNIQUE: Contiguous axial images were obtained from the base of the skull through the vertex without intravenous contrast. RADIATION DOSE REDUCTION: This exam was performed according to the departmental dose-optimization program which includes automated exposure control, adjustment of the mA and/or kV according to patient size and/or use of iterative reconstruction technique. COMPARISON:  Head 12/20/2022. FINDINGS: Brain: Decreased size and density of previously seen subarachnoid hemorrhage with a few areas of persistent small hyperdensity. Intermediate density right subdural hemorrhage is decreased in thickness, now 7 mm on the right and 3 mm on the left (previously 9 and 11 mm). No substantial midline shift. No evidence of acute large vascular territory infarct, mass lesion or hydrocephalus. Cerebral atrophy and chronic microvascular ischemic change. Basal ganglia and cerebellar calcifications. Vascular: No hyperdense vessel identified. Calcific atherosclerosis. Skull: No acute fracture. Sinuses/Orbits: Clear sinuses.  No acute orbital findings. Other: No mastoid effusions. IMPRESSION: 1. Intermediate density bilateral subdural hemorrhage is decreased in thickness, now 7 mm on the right and 3 mm on the left (previously 9 and 11 mm when measured on that study). 2. Decreased size and density of previously seen multifocal subarachnoid hemorrhage. Electronically Signed   By: Feliberto Harts M.D.   On: 01/02/2023 09:27   DG CHEST PORT 1 VIEW  Result Date: 01/01/2023 CLINICAL DATA:  Cough EXAM: PORTABLE CHEST 1 VIEW COMPARISON:  12/26/2022 FINDINGS: Atherosclerotic calcification of the aortic  arch. Coronary artery stent noted. Heart size within normal limits. The lungs appear clear.  No blunting of the costophrenic angles. Severe degenerative glenohumeral arthropathy on the right with elimination of the acromial humeral distance suggesting chronic right rotator cuff tear. A feeding tube extends into the stomach and beyond the inferior margin of today's image. IMPRESSION: 1. No acute findings. 2. Feeding tube extends into the stomach and beyond the inferior margin of today's image. 3. Severe degenerative glenohumeral arthropathy on the right with chronic right rotator cuff tear. Electronically Signed   By: Gaylyn Rong M.D.   On: 01/01/2023 13:21   DG Swallowing Func-Speech Pathology  Result Date: 12/28/2022 Blaine Hamper     12/28/2022  5:38 PM Modified Barium Swallow Study Patient Details Name: Axavier Partridge Sr. MRN: [id number] Date of Birth: 1931-07-22 Today's Date: 12/28/2022 HPI/PMH: HPI: Pt is an 89 y.o. male who presented with AMS after a fall. CT head: Scattered areas of subarachnoid hemorrhage in the left occipital  lobe, bilateral high frontal lobes, and right temporal lobe. Small amount of subarachnoid hemorrhage posterior to the right midbrain extending into the cerebral aqueduct. PMH: COPD, prior NSTEMI / CHF, NSVT, PAF, on ASA81. Clinical Impression: Clinical Impression: Patient presents with a mild-moderate oral dysphagia and a moderately impaired pharyngeal phase dysphagia as per this MBS. SLP suspects that patient has a chronic dysphagia that is exacerbated by his current cognitive state. During oral phase, patient with delayed anterior to posterior transit of boluses as well as impaired awareness to boluses, leading him to bite at spoon, hold liquids in mouth and have difficulty initiating straw sips. SLP assessed his swallowing with thin liquids and nectar thick liquids. Cortrak feeding tube was present during this test. Epiglottis appeared somewhat edematous and in  addition, posterior pharyngeal wall was bulging (suspected cervical osteophytes but no radiologist present to confirm). This resulted in epiglottis making contact with posterior pharyngeal wall during swallow which in turn caused impacted pharyngeal clearance of boluses. After initial swallows of nectar thick and thin liquids, patient with moderate amount of barium remaining in vallecular sinus, posterior pharyngeal wall and pyriform sinus. Patient was frequently moving around and sliding down in chair, which prevented a clear view of PES or trachea. There was an instance of penetration above vocal cords visualized one time and suspected to be occuring frequently. SLP recommending continue NPO at this time but allow ice chips, water sips PRN. Plan for repeat MBS when patient's cognition has improved. Factors that may increase risk of adverse event in presence of aspiration [employer] Coots 2021): Factors that may increase risk of adverse event in presence of aspiration [employer]ye [employer]aks [employer]ance Coots 2021): Poor general health and/or compromised immunity; Reduced cognitive function; Frail or deconditioned; Dependence for feeding and/or oral hygiene; Inadequate oral hygiene Recommendations/Plan: Swallowing Evaluation Recommendations Swallowing Evaluation Recommendations Recommendations: NPO Medication Administration: Via alternative means Oral care recommendations: Oral care QID (4x/day); Oral care before ice chips/water; Staff/trained caregiver to provide oral care Treatment Plan Treatment Plan Treatment recommendations: Therapy as outlined in treatment plan below Follow-up recommendations: Skilled nursing-short term rehab (<3 hours/day) Functional status assessment: Patient has had a recent decline in their functional status and demonstrates the ability to make significant improvements in function in a reasonable and predictable amount of time. Treatment frequency: Min 2x/week Treatment duration: 2 weeks Interventions:  Aspiration precaution training; Trials of upgraded texture/liquids; Diet toleration management by SLP; Patient/family education Recommendations Recommendations for follow up therapy are one component of a multi-disciplinary discharge planning process, led by the attending physician.  Recommendations may be updated based on patient status, additional functional criteria and insurance authorization. Assessment: Orofacial Exam: Orofacial Exam Oral Cavity: Oral Hygiene: WFL Oral Cavity - Dentition: Poor condition; Missing dentition; Dentures, top; Dentures, not available Orofacial Anatomy: WFL Anatomy: Anatomy: Suspected cervical osteophytes Boluses Administered: Boluses Administered Boluses Administered: Thin liquids (Level 0); Mildly thick liquids (Level 2, nectar thick)  Oral Impairment Domain: Oral Impairment Domain Lip Closure: No labial escape Tongue control during bolus hold: Not tested Bolus preparation/mastication: Slow prolonged chewing/mashing with complete recollection Bolus transport/lingual motion: Delayed initiation of tongue motion (oral holding) Oral residue: Residue collection on oral structures Location of oral residue : Tongue; Palate Initiation of  pharyngeal swallow : Valleculae  Pharyngeal Impairment Domain: Pharyngeal Impairment Domain Laryngeal vestibule closure: None, wide column air/contrast in laryngeal vestibule Pharyngeal stripping wave : Present - diminished Pharyngeal contraction (A/P view only): N/A Pharyngoesophageal segment opening: Partial distention/partial duration, partial obstruction of flow Tongue base retraction: Narrow column of contrast or air between tongue base and PPW Pharyngeal residue: Majority of contrast within or on pharyngeal structures Location of pharyngeal residue: Valleculae; Pharyngeal wall; Pyriform sinuses; Aryepiglottic folds  Esophageal Impairment Domain: No data recorded Pill: No data recorded Penetration/Aspiration Scale Score: Penetration/Aspiration Scale  Score 3.  Material enters airway, remains ABOVE vocal cords and not ejected out: Mildly thick liquids (Level 2, nectar thick); Thin liquids (Level 0) Compensatory Strategies: Compensatory Strategies Compensatory strategies: No   General Information: Caregiver present: No  Diet Prior to this Study: NPO   Temperature : Normal   Respiratory Status: WFL   Supplemental O2: None (Room air)   History of Recent Intubation: No  Behavior/Cognition: Alert; Cooperative; Confused; Requires cueing; Doesn't follow directions Self-Feeding Abilities: Dependent for feeding Baseline vocal quality/speech: Not observed Volitional Cough: Unable to elicit Volitional Swallow: Unable to elicit Exam Limitations: Poor participation Goal Planning: Prognosis for improved oropharyngeal function: Good Barriers to Reach Goals: Cognitive deficits; Severity of deficits No data recorded Patient/Family Stated Goal: none stated Consulted and agree with results and recommendations: Pt unable/family or caregiver not available Pain: Pain Assessment Pain Assessment: Faces Faces Pain Scale: 0 Breathing: 0 Negative Vocalization: 0 Facial Expression: 0 Body Language: 0 Consolability: 0 PAINAD Score: 0 Pain Location: L leg with PROM Pain Descriptors / Indicators: Discomfort; Grimacing; Guarding Pain Intervention(s): Limited activity within patient's tolerance; Monitored during session; Repositioned End of Session: Start Time:SLP Start Time (ACUTE ONLY): 1400 Stop Time: SLP Stop Time (ACUTE ONLY): 1417 Time Calculation:SLP Time Calculation (min) (ACUTE ONLY): 17 min Charges: SLP Evaluations $ SLP Speech Visit: 1 Visit SLP Evaluations $Swallowing Treatment: 1 Procedure SLP visit diagnosis: SLP Visit Diagnosis: Dysphagia, oropharyngeal phase (R13.12) Past Medical History: Past Medical History: Diagnosis Date  Acquired hypothyroidism 08/15/2019  Acute systolic heart failure (HCC) 06/25/2020  Anemia 02/05/2020  boarderline anemic  Atopic dermatitis 12/05/2019  Bacterial  pneumonia, unspecified 06/14/2012  06/14/2012 CXR w/ RML PNA >Zithromax and Rocephin    Bradycardia 11/28/2018  CAD (coronary artery disease) 07/07/2020  Cancer (HCC)   prostate cancer-seed implant  Closed fracture dislocation of right elbow   Closed fracture of right olecranon process 04/10/2020  COPD (chronic obstructive pulmonary disease) (HCC) 01/21/2020  Dermatitis 02/05/2020  Dizziness 02/19/2016  Essential hypertension 11/28/2018  History of prostate cancer   seed implant  Hyperlipemia   Hyperlipidemia, mixed 08/21/2008  Qualifier: Diagnosis of  By: Maple Hudson MD, Clinton D   LVH (left ventricular hypertrophy) 11/28/2018  Malnutrition of moderate degree (HCC) 08/15/2019  Mixed conductive and sensorineural hearing loss, 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  Moderate persistent asthma, uncomplicated 01/21/2020  Nonsustained ventricular tachycardia (HCC) 01/30/2019  NSTEMI (non-ST elevated myocardial infarction) (HCC) 06/21/2020  Other fatigue 02/05/2020  PAF (paroxysmal atrial fibrillation) (HCC) 06/25/2020  Seasonal and perennial allergic rhinitis 05/17/2007     Senile osteoporosis 08/15/2019  SINUSITIS, ACUTE 06/28/2007  Qualifier: Diagnosis of  By: Clent Ridges NP, Tammy    Ventricular ectopy 11/28/2018 Past Surgical History: Past Surgical History: Procedure Laterality Date  APPENDECTOMY    CORONARY ATHERECTOMY N/A 06/24/2020  Procedure: CORONARY ATHERECTOMY;  Surgeon: Iran Ouch, MD;  [facility];  Service: Cardiovascular;  Laterality: N/A;  CORONARY ULTRASOUND/IVUS N/A 06/24/2020  Procedure: Intravascular Ultrasound/IVUS;  Surgeon: Iran Ouch, MD;  [facility];  Service: Cardiovascular;  Laterality: N/A;  LEFT HEART CATH AND CORONARY ANGIOGRAPHY N/A 06/22/2020  Procedure: LEFT HEART CATH AND CORONARY ANGIOGRAPHY;  Surgeon: Yvonne Kendall, MD;  [facility];  Service: Cardiovascular;  Laterality: N/A;  ORIF ELBOW FRACTURE Right 04/10/2020  Procedure: OPEN REDUCTION INTERNAL FIXATION (ORIF)  ELBOW/OLECRANON FRACTURE;  Surgeon: Teryl Lucy, MD;  Location: [facility];  Service: Orthopedics;  Laterality: Right; Angela Nevin, MA, CCC-SLP Speech Therapy   DG CHEST PORT 1 VIEW  Result Date: 12/26/2022 CLINICAL DATA:  Fever EXAM: PORTABLE CHEST 1 VIEW COMPARISON:  12/17/2022 FINDINGS: Mild right basilar atelectasis. Lungs are otherwise clear. No pneumothorax or pleural effusion. Cardiac size is within normal limits. Previously noted trace perihilar interstitial pulmonary edema has resolved. Nasoenteric feeding tube has been placed, extending into the upper abdomen beyond the margin of the examination. IMPRESSION: 1. Mild right basilar atelectasis. Electronically Signed   By: Helyn Numbers M.D.   On: 12/26/2022 19:39   CT HEAD WO CONTRAST ( )  Result Date: 12/20/2022 CLINICAL DATA:  Subarachnoid hemorrhage [facility]) EXAM: CT HEAD WITHOUT CONTRAST TECHNIQUE: Contiguous axial images were obtained from the base of the skull through the vertex without intravenous contrast. RADIATION DOSE REDUCTION: This exam was performed according to the departmental dose-optimization program which includes automated exposure control, adjustment of the mA and/or kV according to patient size and/or use of iterative reconstruction technique. COMPARISON:  CT head 12/15/2022 FINDINGS: Brain: Cerebral ventricle sizes are concordant with the degree of cerebral volume loss. Patchy and confluent areas of decreased attenuation are noted throughout the deep and periventricular white matter of the cerebral hemispheres bilaterally, compatible with chronic microvascular ischemic disease. No evidence of large-territorial acute infarction. Question slightly increased in size (limited evaluation due to motion artifact) high frontal bilateral, right greater than left, subarachnoid hemorrhages with question intraparenchymal component. Associated developing mild vasogenic edema on the right. Underlying mass is not  excluded. Interval development of a 3 mm right parafalcine subdural hematoma (5:22). Nonspecific bilateral basal ganglia mineralization. No mass effect or midline shift. No hydrocephalus. Basilar cisterns are patent. Vascular: No hyperdense vessel. Atherosclerotic calcifications are present within the cavernous internal carotid arteries. Skull: No acute fracture or focal lesion. Sinuses/Orbits: Paranasal sinuses and mastoid air cells are clear. Bilateral lens replacement. Otherwise the orbits are unremarkable. Other: None. IMPRESSION: 1. Interval development of a 3 mm right parafalcine subdural hematoma. 2. Question slightly increased in size (limited evaluation due to motion artifact) high frontal bilateral, right greater than left, subarachnoid hemorrhages with question intraparenchymal component. Associated developing mild vasogenic edema on the right. Underlying mass is not excluded. Recommend MRI brain with and without contrast for further evaluation. 3. Unable to evaluate for previously visualized left occipital and right temporal cirrhotic no hemorrhages due to motion artifact. These results will be called to the ordering clinician or representative by the Radiologist Assistant, and communication documented in the PACS or [employer]. Electronically Signed   By: Tish Frederickson M.D.   On: 12/20/2022 19:22   ECHOCARDIOGRAM COMPLETE  Result Date: 12/19/2022    ECHOCARDIOGRAM REPORT   Patient Name:   James Rossano Sr. Date of Exam: 12/19/2022 Medical Rec #:  [id number]             Height:       72.0 in Accession #:    [id number]            Weight:  140.0 lb Date of Birth:  1932-02-17              BSA:          1.831 m Patient Age:    89 years              BP:           160/92 mmHg Patient Gender: M                     HR:           97 bpm. Exam Location:  Inpatient Procedure: 2D Echo, Cardiac Doppler and Color Doppler Indications:    Atrial Fibrillation I48.91  History:        Patient has prior  history of Echocardiogram examinations, most                 recent 06/23/2020. CHF and Cardiomyopathy, CAD and Previous                 Myocardial Infarction, COPD, Arrythmias:PVC, Tachycardia and                 Atrial Fibrillation; Risk Factors:Hypertension.  Sonographer:    Lucendia Herrlich Referring Phys: [id number] AVWUJW POKHREL  Sonographer Comments: Image acquisition challenging due to patient behavioral factors. IMPRESSIONS  1. Left ventricular ejection fraction, by estimation, is 50 to 55%. The left ventricle has low normal function. The left ventricle has no regional wall motion abnormalities. Left ventricular diastolic parameters are indeterminate.  2. Right ventricular systolic function is normal. The right ventricular size is normal.  3. The mitral valve is normal in structure. Moderate mitral valve regurgitation. No evidence of mitral stenosis.  4. The aortic valve is normal in structure. Aortic valve regurgitation is not visualized. Mild aortic valve stenosis.  5. There is mild dilatation of the aortic root, measuring 37 mm.  6. The inferior vena cava is normal in size with greater than 50% respiratory variability, suggesting right atrial pressure of 3 mmHg. FINDINGS  Left Ventricle: Left ventricular ejection fraction, by estimation, is 50 to 55%. The left ventricle has low normal function. The left ventricle has no regional wall motion abnormalities. The left ventricular internal cavity size was normal in size. There is no left ventricular hypertrophy. Left ventricular diastolic parameters are indeterminate. Right Ventricle: The right ventricular size is normal. No increase in right ventricular wall thickness. Right ventricular systolic function is normal. Left Atrium: Left atrial size was normal in size. Right Atrium: Right atrial size was normal in size. Pericardium: There is no evidence of pericardial effusion. Mitral Valve: The mitral valve is normal in structure. Moderate mitral valve  regurgitation. No evidence of mitral valve stenosis. Tricuspid Valve: The tricuspid valve is normal in structure. Tricuspid valve regurgitation is not demonstrated. No evidence of tricuspid stenosis. Aortic Valve: The aortic valve is normal in structure. Aortic valve regurgitation is not visualized. Aortic regurgitation PHT measures 740 msec. Mild aortic stenosis is present. Aortic valve mean gradient measures 9.0 mmHg. Aortic valve peak gradient measures 17.6 mmHg. Aortic valve area, by VTI measures 1.66 cm. Pulmonic Valve: The pulmonic valve was normal in structure. Pulmonic valve regurgitation is not visualized. No evidence of pulmonic stenosis. Aorta: There is mild dilatation of the aortic root, measuring 37 mm. Venous: The inferior vena cava is normal in size with greater than 50% respiratory variability, suggesting right atrial pressure of 3 mmHg. IAS/Shunts: No atrial level shunt detected by color flow Doppler.  LEFT VENTRICLE PLAX 2D LVIDd:         4.00 cm   Diastology LVIDs:         2.90 cm   LV e' medial:    12.40 cm/s LV PW:         0.70 cm   LV E/e' medial:  9.0 LV IVS:        1.00 cm   LV e' lateral:   13.50 cm/s LVOT diam:     2.00 cm   LV E/e' lateral: 8.2 LV SV:         52 LV SV Index:   28 LVOT Area:     3.14 cm  RIGHT VENTRICLE             IVC RV S prime:     20.50 cm/s  IVC diam: 1.35 cm TAPSE (M-mode): 1.8 cm LEFT ATRIUM             Index        RIGHT ATRIUM           Index LA diam:        4.00 cm 2.18 cm/m   RA Area:     16.50 cm LA Vol (A2C):   49.4 ml 26.98 ml/m  RA Volume:   40.00 ml  21.85 ml/m LA Vol (A4C):   52.5 ml 28.67 ml/m LA Biplane Vol: 51.2 ml 27.96 ml/m  AORTIC VALVE AV Area (Vmax):    1.49 cm AV Area (Vmean):   1.46 cm AV Area (VTI):     1.66 cm AV Vmax:           210.00 cm/s AV Vmean:          136.000 cm/s AV VTI:            0.311 m AV Peak Grad:      17.6 mmHg AV Mean Grad:      9.0 mmHg LVOT Vmax:         99.40 cm/s LVOT Vmean:        63.367 cm/s LVOT VTI:           0.165 m LVOT/AV VTI ratio: 0.53 AI PHT:            740 msec  AORTA Ao Root diam: 3.70 cm MITRAL VALVE                TRICUSPID VALVE MV Area (PHT): 5.31 cm     TR Peak grad:   36.7 mmHg MV Decel Time: 143 msec     TR Vmax:        303.00 cm/s MR Peak grad: 147.4 mmHg MR Vmax:      607.00 cm/s   SHUNTS MV E velocity: 111.00 cm/s  Systemic VTI:  0.16 m MV A velocity: 123.00 cm/s  Systemic Diam: 2.00 cm MV E/A ratio:  0.90 Kardie Tobb DO Electronically signed by Thomasene Ripple DO Signature Date/Time: 12/19/2022/1:37:51 PM    Final    DG Abd Portable 1V  Result Date: 12/19/2022 CLINICAL DATA:  Feeding tube placement EXAM: PORTABLE ABDOMEN - 1 VIEW COMPARISON:  Chest radiograph done on 12/17/2022 FINDINGS: Tip of feeding tube is seen in the region of the antrum of the stomach. Bowel gas pattern in the upper abdomen is unremarkable. Pelvis is not included in its entirety. Right side of the abdomen is not included in the image. Visualized lung fields are clear. Coronary artery stent is seen. IMPRESSION: Tip of feeding tube is  seen in the region of the antrum of the stomach. Electronically Signed   By: Ernie Avena M.D.   On: 12/19/2022 11:05   DG CHEST PORT 1 VIEW  Result Date: 12/17/2022 CLINICAL DATA:  Fevers EXAM: PORTABLE CHEST 1 VIEW COMPARISON:  12/15/2022 FINDINGS: Cardiac shadow is stable. Aortic calcifications are noted. The lungs are well aerated bilaterally. No focal infiltrate or effusion is seen. No bony abnormality is noted. IMPRESSION: No acute abnormality seen. Electronically Signed   By: Alcide Clever M.D.   On: 12/17/2022 22:15    Microbiology: Results for orders placed or performed during the hospital encounter of 12/15/22  Urine Culture     Status: Abnormal   Collection Time: 12/17/22  8:20 PM   Specimen: Urine, Catheterized  Result Value Ref Range Status   Specimen Description URINE, CATHETERIZED  Final   Special Requests NONE  Final   Culture (A)  Final    >=100,000 COLONIES/mL  ESCHERICHIA COLI Two isolates with different morphologies were identified as the same organism.The most resistant organism was reported. Performed at [facility], [address], [place], [place] [address]    Report Status 12/20/2022 FINAL  Final   Organism ID, Bacteria ESCHERICHIA COLI (A)  Final      Susceptibility   Escherichia coli - MIC*    AMPICILLIN >=32 RESISTANT Resistant     CEFAZOLIN >=64 RESISTANT Resistant     CEFEPIME <=0.12 SENSITIVE Sensitive     CEFTRIAXONE 0.5 SENSITIVE Sensitive     CIPROFLOXACIN <=0.25 SENSITIVE Sensitive     GENTAMICIN <=1 SENSITIVE Sensitive     IMIPENEM <=0.25 SENSITIVE Sensitive     NITROFURANTOIN 64 INTERMEDIATE Intermediate     TRIMETH/SULFA <=20 SENSITIVE Sensitive     PIP/TAZO <=4 SENSITIVE Sensitive     * >=100,000 COLONIES/mL ESCHERICHIA COLI  Resp panel by RT-PCR (RSV, Flu A&B, Covid) Anterior Nasal Swab     Status: None   Collection Time: 12/17/22 10:29 PM   Specimen: Anterior Nasal Swab  Result Value Ref Range Status   SARS Coronavirus 2 by RT PCR NEGATIVE NEGATIVE Final   Influenza A by PCR NEGATIVE NEGATIVE Final   Influenza B by PCR NEGATIVE NEGATIVE Final    Comment: (NOTE) The Xpert Xpress SARS-CoV-2/FLU/RSV plus assay is intended as an aid in the diagnosis of influenza from Nasopharyngeal swab specimens and should not be used as a sole basis for treatment. Nasal washings and aspirates are unacceptable for Xpert Xpress SARS-CoV-2/FLU/RSV testing.  Fact Sheet for Patients: BloggerCourse.com  Fact Sheet for Healthcare Providers: SeriousBroker.it  This test is not yet approved or cleared by the [place] FDA and has been authorized for detection and/or diagnosis of SARS-CoV-2 by FDA under an Emergency Use Authorization (EUA). This EUA will remain in effect (meaning this test can be used) for the duration of the COVID-19 declaration under Section 564(b)(1) of the  Act, 21 U.S.C. section 360bbb-3(b)(1), unless the authorization is terminated or revoked.     Resp Syncytial Virus by PCR NEGATIVE NEGATIVE Final    Comment: (NOTE) Fact Sheet for Patients: BloggerCourse.com  Fact Sheet for Healthcare Providers: SeriousBroker.it  This test is not yet approved or cleared by the [place] FDA and has been authorized for detection and/or diagnosis of SARS-CoV-2 by FDA under an Emergency Use Authorization (EUA). This EUA will remain in effect (meaning this test can be used) for the duration of the COVID-19 declaration under Section 564(b)(1) of the Act, 21 U.S.C. section 360bbb-3(b)(1), unless  the authorization is terminated or revoked.  Performed at [facility], [address], [place], [place] [address]   Culture, Urine (Do not remove urinary catheter, catheter placed by urology or difficult to place)     Status: None   Collection Time: 12/26/22  2:54 PM   Specimen: Urine, Catheterized  Result Value Ref Range Status   Specimen Description URINE, CATHETERIZED  Final   Special Requests NONE  Final   Culture   Final    NO GROWTH Performed at [facility], [address] [place], [place] [address]    Report Status 12/28/2022 FINAL  Final  Culture, blood (Routine X 2) w Reflex to ID Panel     Status: None   Collection Time: 12/26/22  3:32 PM   Specimen: BLOOD RIGHT ARM  Result Value Ref Range Status   Specimen Description BLOOD RIGHT ARM  Final   Special Requests   Final    BOTTLES DRAWN AEROBIC AND ANAEROBIC Blood Culture adequate volume   Culture   Final    NO GROWTH 5 DAYS Performed at [facility], [address] [place], [place] [address]    Report Status 12/31/2022 FINAL  Final  Culture, blood (Routine X 2) w Reflex to ID Panel     Status: None   Collection Time: 12/26/22  3:33 PM   Specimen: BLOOD LEFT WRIST  Result Value Ref Range Status   Specimen  Description BLOOD LEFT WRIST  Final   Special Requests   Final    BOTTLES DRAWN AEROBIC AND ANAEROBIC Blood Culture adequate volume   Culture   Final    NO GROWTH 5 DAYS Performed at [facility], [address] [place], [place] [address]    Report Status 12/31/2022 FINAL  Final  Culture, Urine (Do not remove urinary catheter, catheter placed by urology or difficult to place)     Status: Abnormal   Collection Time: 01/09/23 11:15 AM   Specimen: Urine, Catheterized  Result Value Ref Range Status   Specimen Description URINE, CATHETERIZED  Final   Special Requests   Final    NONE Performed at [facility], [address], [place], [place] [address]    Culture (A)  Final    >=100,000 COLONIES/mL ESCHERICHIA COLI >=100,000 COLONIES/mL ENTEROBACTER CLOACAE    Report Status 01/11/2023 FINAL  Final   Organism ID, Bacteria ESCHERICHIA COLI (A)  Final   Organism ID, Bacteria ENTEROBACTER CLOACAE (A)  Final      Susceptibility   Enterobacter cloacae - MIC*    CEFEPIME 2 SENSITIVE Sensitive     CIPROFLOXACIN <=0.25 SENSITIVE Sensitive     GENTAMICIN <=1 SENSITIVE Sensitive     IMIPENEM <=0.25 SENSITIVE Sensitive     NITROFURANTOIN 64 INTERMEDIATE Intermediate     TRIMETH/SULFA <=20 SENSITIVE Sensitive     PIP/TAZO >=128 RESISTANT Resistant     * >=100,000 COLONIES/mL ENTEROBACTER CLOACAE   Escherichia coli - MIC*    AMPICILLIN >=32 RESISTANT Resistant     CEFAZOLIN <=4 SENSITIVE Sensitive     CEFEPIME <=0.12 SENSITIVE Sensitive     CEFTRIAXONE <=0.25 SENSITIVE Sensitive     CIPROFLOXACIN <=0.25 SENSITIVE Sensitive     GENTAMICIN <=1 SENSITIVE Sensitive     IMIPENEM <=0.25 SENSITIVE Sensitive     NITROFURANTOIN <=16 SENSITIVE Sensitive     TRIMETH/SULFA <=20 SENSITIVE Sensitive     AMPICILLIN/SULBACTAM 16 INTERMEDIATE Intermediate     PIP/TAZO <=4 SENSITIVE Sensitive     * >=  100,000 COLONIES/mL ESCHERICHIA COLI  Urine Culture (for pregnant, neutropenic or urologic  patients or patients with an indwelling urinary catheter)     Status: Abnormal   Collection Time: 01/12/23 12:06 PM   Specimen: Urine, Catheterized  Result Value Ref Range Status   Specimen Description URINE, CATHETERIZED  Final   Special Requests NONE  Final   Culture (A)  Final    <10,000 COLONIES/mL INSIGNIFICANT GROWTH Performed at [facility], [address], [place], [place] [address]    Report Status 01/13/2023 FINAL  Final    Labs: CBC: Recent Labs  Lab 01/10/23 0721 01/11/23 0528 01/12/23 1138 01/14/23 0820  WBC 10.8* 12.5* 11.1* 10.5  HGB 9.9* 11.3* 10.9* 10.9*  HCT 30.3* 35.3* 33.5* 34.4*  MCV 94.7 95.4 97.4 95.0  PLT 310 343 310 301   Basic Metabolic Panel: Recent Labs  Lab 01/10/23 0721 01/11/23 0528 01/12/23 1138 01/14/23 0820  NA 130* 133* 134* 131*  K 4.0 3.9 4.9 4.6  CL 100 101 100 102  CO2 21* 20* 24 23  GLUCOSE 99 121* 117* 109*  BUN 18 18 23  26*  CREATININE 0.60* 0.62 0.65 0.73  CALCIUM 8.1* 8.5* 8.4* 8.1*   Liver Function Tests: No results for input(s): "AST", "ALT", "ALKPHOS", "BILITOT", "PROT", "ALBUMIN" in the last 168 hours. CBG: Recent Labs  Lab 01/15/23 1935 01/15/23 2323 01/16/23 0306 01/16/23 0814 01/16/23 1110  GLUCAP 162* 137* 122* 109* 135*    Discharge time spent: greater than 30 minutes.  Signed: Alba Cory, MD [facility] 01/16/2023

## 2023-01-17 DIAGNOSIS — I609 Nontraumatic subarachnoid hemorrhage, unspecified: Secondary | ICD-10-CM | POA: Diagnosis not present

## 2023-01-17 LAB — GLUCOSE, CAPILLARY
Glucose-Capillary: 134 mg/dL — ABNORMAL HIGH (ref 70–99)
Glucose-Capillary: 100 mg/dL — ABNORMAL HIGH (ref 70–99)
Glucose-Capillary: 114 mg/dL — ABNORMAL HIGH (ref 70–99)
Glucose-Capillary: 113 mg/dL — ABNORMAL HIGH (ref 70–99)
Glucose-Capillary: 127 mg/dL — ABNORMAL HIGH (ref 70–99)
Glucose-Capillary: 116 mg/dL — ABNORMAL HIGH (ref 70–99)

## 2023-01-17 MED ORDER — SULFAMETHOXAZOLE-TRIMETHOPRIM 800-160 MG PO TABS: 1.0000 | ORAL_TABLET | Freq: Two times a day (BID) | ORAL | 0 refills | Status: AC

## 2023-01-17 NOTE — TOC Progression Note (Addendum)
Transition of Care (TOC) - Progression Note    Patient Details  Name: [name]. MRN: [id number] Date of Birth: 1931-06-08  Transition of Care [facility]) CM/SW Contact  Eduard Roux, [place] Phone Number: 01/17/2023, 1:40 PM  Clinical Narrative:     11:19 am- Sent message to [facility] and left voice message- requested to review referral re-sent on 01/16/2023 at family's request( patient initially was declined by SNF)  11:30 am - CSW met with patient's daughter, informed, unable to reach admission  [facility] but CSW will follow up again this am. Explained we have auth for [facility] and if no decision from [place] today we may need to move forward with discharge to [facility]. CSW explained [facility]ate[facility] has been medically ready since last week. She states understanding but requested to wait on decision from [place] before the patient is discharged.   12:52 pm- [facility] reviewed clinicals - made offer for placement. CSW updated the family and they accepted bed offer.  CMA Marylene Land is assisting with getting [facility] authorization for [facility]. MD, RN Wadie Lessen Place and Larita Fife has been updated on disposition.   TOC will continue to follow and assist with discharge planning.   Antony Blackbird, MSW, LCSW Clinical Social Worker     Expected Discharge Plan: Skilled Nursing Facility Barriers to Discharge: Continued Medical Work up  Expected Discharge Plan and Services In-house Referral: Clinical Social Work       Expected Discharge Date: 01/13/23                                     Social Determinants of Health (SDOH) Interventions SDOH Screenings   Food Insecurity: No Food Insecurity (12/16/2022)  Housing: Low Risk  (12/16/2022)  Transportation Needs: No Transportation Needs (12/16/2022)  Utilities: Not At Risk (12/16/2022)  Alcohol Screen: Low Risk  (07/08/2022)  Depression (PHQ2-9): Low Risk  (07/26/2022)  Financial Resource Strain: Low Risk  (03/30/2022)  Physical  Activity: Insufficiently Active (01/29/2021)  Social Connections: Socially Integrated (01/29/2021)  Stress: No Stress Concern Present (01/29/2021)  Tobacco Use: Medium Risk (01/05/2023)    Readmission Risk Interventions     No data to display

## 2023-01-17 NOTE — Discharge Summary (Signed)
Physician Discharge Summary   Patient: James Frana Sr. MRN: [id number] DOB: [birth year]  Admit date:     12/15/2022  Discharge date: 01/17/23  Discharge Physician: Alba Cory   PCP: Blane Ohara, MD   Recommendations at discharge:   Needs to follow up with Dr Ave Filter, neurosurgery in 1 week Needs to follow up with Urology Continue with intensive speech therapy, PT, OT>    Discharge Diagnoses: Principal Problem:   Subarachnoid hemorrhage (HCC) Active Problems:   Frequent falls   Acute metabolic encephalopathy   Frequent PVCs   PAF (paroxysmal atrial fibrillation) (HCC)   Chronic HFrEF with improved EF(heart failure with reduced ejection fraction) (HCC)   Ischemic cardiomyopathy   SIRS (systemic inflammatory response syndrome) (HCC)   CAD S/P LAD stent 2022   History of recent fall   Essential hypertension   Acquired hypothyroidism   Mixed conductive and sensorineural hearing loss, bilateral   Moderate persistent asthma, uncomplicated   Anemia   At high risk for bleeding   Hypokalemia   Protein-calorie malnutrition, severe   Urinary retention  Resolved Problems:   * No resolved hospital problems. *  Hospital Course: 89 year old with past medical history significant for A-fib, known SVT, ischemic cardiomyopathy (non-STEMI status post LAD stent 2022) heart failure reduced ejection fraction (left ventricular ejection fraction improved to 55 on 2022 from 35%), hypertension, COPD, hypothyroidism, chronic anemia, frequent falls presents AT/8 after a fall found to have a traumatic subarachnoid hemorrhage.   Hospitalization has been complicated by E. coli UTI, asymptomatic runs of A-fib and PVC for which cardiology was consulted, acute urinary retention for which urology was consulted, placed urinary catheter 8/12, dysphagia for which core track has been placed and persistent encephalopathy.  Assessment and Plan: 1-Traumatic subarachnoid hemorrhage/subdural  hematoma: -CT 8/13 mentioned vasogenic edema and expected redistribution of contusions.  Repeat CT scan 8/26 shows improvement in size of SDH. Still encephalopathic.  Had peg tube placed 9/03. Tolerating tube feeding.  Follow up with Neurosurgery in regard resumption of aspirin. Due to [facility] and falls.   Recurrent UTI:  Fever, persistent leukocytosis Recurrent fevers and UTI during this hospitalization Recently completed 7 days of ceftriaxone for E. coli UTI Fever 2 days ago and restarted on IV ceftriaxone. Urine culture growing enterobacter and E coli. Started on Bactrim. He will need 3 more days of oral antibiotics.  Foley catheter exchange 9/05. WBC normalized.   TBI with acute encephalopathy Alert, aphasic. No significant improvement during this admission.  B 12 500, normal.  Thiamine level pending.  Started  thiamine.    Protein caloric malnutrition/dysphagia Family agree with PEG tube placement. Started on tube feeding.    PEG tube placement 9/3; tolerating tube feeding.  No further peg tube leakage.   E. coli UTI: completed ceftriaxone previously.    Acute urinary retention, BPH, prostate cancer status post brachytherapy Neurology was consulted Dr. Benita Gutter over a wire.  He will need catheter until he is more mobile. Will likely need to go to SNF with catheter Needs to follow up with urology for Voiding trial Foley catheter exchange 9/05/. He will need foley exchange every 30 days.    PAF w/RVR, frequent PVCs, hx NSVT Heart rate is better.  Continue metoprolol.   Holding full dose anticoagulation due to fall risk and current SAH, SDH. Not a good candidate for Watchman device.   CAD s/p LAD stent 2022 Elevated troponin is mild at 32, flat, without angina consistent with demand myocardial ischemia.  Continue  beta blocker, statin. Not on antiplatelet due to bleeding.    Chronic HFrEF with improved EF (35>55%, 2022): Continue metoprolol.   Remains euvolemic.    Hypokalemia Resolved.   Hyponatremia:  Stable.  Continue to monitor.   Hypophosphatemia:  Resolved    Chronic normocytic anemia:      Moderate persistent asthma, uncomplicated No exacerbation. - Continue DuoNebs.   Acquired hypothyroidism TSH is 6.177, free T4 is normal.  - Continue levothyroxine 175 mcg daily   Essential hypertension Continue with metoprolol/    Debility, deconditioning, frailty -needs rehab.    Constipation -bowel regimen PRN     Stable for discharge.       Consultants: Neurosurgery  Procedures performed: peg tube placement.  Disposition: Skilled nursing facility Diet recommendation:  Discharge Diet Orders (From admission, onward)     Start     Ordered   01/13/23 0000  Diet - low sodium heart healthy        01/13/23 1024           Diet: NPO, on tube feeding.  DISCHARGE MEDICATION: Allergies as of 01/17/2023       Reactions   Clarithromycin    REACTION: GI upset        Medication List     STOP taking these medications    aspirin EC 81 MG tablet   BOOST NUTRITIONAL ENERGY PO Replaced by: feeding supplement (OSMOLITE 1.2 CAL) Liqd   metoprolol succinate 50 MG 24 hr tablet Commonly known as: TOPROL-XL   terbinafine 250 MG tablet Commonly known as: LAMISIL       TAKE these medications    albuterol 108 (90 Base) MCG/ACT inhaler Commonly known as: VENTOLIN HFA Inhale 2 puffs into the lungs every 6 (six) hours as needed for wheezing or shortness of breath.   atorvastatin 80 MG tablet Commonly known as: LIPITOR Take 0.5 tablets (40 mg total) by mouth every morning. What changed:  how much to take when to take this   famotidine 40 MG/5ML suspension Commonly known as: PEPCID Take 5 mLs (40 mg total) by mouth daily.   feeding supplement (OSMOLITE 1.2 CAL) Liqd Place 1,000 mLs into feeding tube continuous. Replaces: BOOST NUTRITIONAL ENERGY PO   feeding supplement (PROSource TF20) liquid Place 60 mLs into  feeding tube daily.   ferrous sulfate 325 (65 FE) MG tablet Commonly known as: FerrouSul Take 1 tablet (325 mg total) by mouth daily with breakfast.   fexofenadine 180 MG tablet Commonly known as: ALLEGRA Take 180 mg by mouth daily.   free water Soln Place 100 mLs into feeding tube every 12 (twelve) hours.   ipratropium-albuterol 0.5-2.5 (3) MG/3ML Soln Commonly known as: DUONEB Take 3 mLs by nebulization every 4 (four) hours as needed (shortness of breath or wheezing).   levothyroxine 175 MCG tablet Commonly known as: SYNTHROID TAKE ONE TABLET BY MOUTH BEFORE BREAKFAST   metoprolol tartrate 25 MG tablet Commonly known as: LOPRESSOR Place 1 tablet (25 mg total) into feeding tube 2 (two) times daily.   MULTIVITAMIN ADULT PO Take 1 tablet by mouth daily. Unknown strength   sulfamethoxazole-trimethoprim 800-160 MG tablet Commonly known as: BACTRIM DS Place 1 tablet into feeding tube every 12 (twelve) hours for 3 days.   thiamine 100 MG tablet Commonly known as: Vitamin B-1 Place 1 tablet (100 mg total) into feeding tube daily.         Discharge Exam: Filed Weights   01/14/23 0713 01/15/23 0700 01/16/23 0305  Weight: 59.1 kg  57.9 kg 54.7 kg   General; NAD  Condition at discharge: stable  The results of significant diagnostics from this hospitalization (including imaging, microbiology, ancillary and laboratory) are listed below for reference.   Imaging Studies: DG Abd 1 View  Result Date: 01/15/2023 CLINICAL DATA:  Complaint associated with gastric tube EXAM: ABDOMEN - 1 VIEW COMPARISON:  Percutaneous gastrostomy tube placement images 9324; abdominal radiographs 01/06/2023 FINDINGS: Balloon retention percutaneous gastrostomy tube projects over the expected location. In the absence of intravenous contrast, intraluminal placement cannot be confirmed. No evidence of bowel obstruction. Previously ingested oral contrast material present throughout the colon. Brachytherapy  seeds overlying the prostate gland. A catheter overlies the pelvis, presumably within the bladder. No acute osseous abnormality. IMPRESSION: 1. Balloon retention gastrostomy tube projects over the expected location. If confirmation of intragastric location is desired, recommend injecting water-soluble contrast through the tube and repeating the abdominal radiograph. 2. Previously ingested oral contrast material visible throughout the colon. 3. No evidence of bowel obstruction or large free air. Electronically Signed   By: Malachy Moan M.D.   On: 01/15/2023 07:09   IR GASTROSTOMY TUBE MOD SED  Result Date: 01/10/2023 INDICATION: 89 year old male with history of stroke and failure to thrive presenting for percutaneous gastrostomy tube placement. EXAM: PERC PLACEMENT GASTROSTOMY MEDICATIONS: The patient was currently receiving intravenous antibiotics as an inpatient. ANESTHESIA/SEDATION: Versed 1 mg IV; Fentanyl 50 mcg IV Moderate Sedation Time:  12 The patient was continuously monitored during the procedure by the interventional radiology nurse under my direct supervision. CONTRAST:  10mL OMNIPAQUE IOHEXOL 300 MG/ML SOLN - administered into the gastric lumen. FLUOROSCOPY TIME:  One mGy COMPLICATIONS: None immediate. PROCEDURE: Informed written consent was obtained from the patient after a thorough discussion of the procedural risks, benefits and alternatives. All questions were addressed. Maximal Sterile barrier Technique was utilized including caps, mask, sterile gowns, sterile gloves, sterile drape, hand hygiene and skin antiseptic. A timeout was performed prior to the initiation of the procedure. The patient was placed on the procedure table in the supine position. Pre-procedure abdominal film confirmed visualization of the transverse colon. The patient was prepped and draped in usual sterile fashion. The stomach was insufflated with air via the indwelling nasogastric tube. Under fluoroscopy, a puncture  site was selected and local analgesia achieved with 1% lidocaine infiltrated subcutaneously. Under fluoroscopic guidance, a gastropexy needle was passed into the stomach and the T-bar suture was released. Entry into the stomach was confirmed with fluoroscopy, aspiration of air, and injection of contrast material. This was repeated with an additional gastropexy suture (for a total of 2 fasteners). At the center of these gastropexy sutures, a dermatotomy was performed. An 18 gauge needle was passed into the stomach at the site of this dermatotomy, and position within the gastric lumen again confirmed under fluoroscopy using aspiration of air and contrast injection. An Amplatz guidewire was passed through this needle and intraluminal placement within the stomach was confirmed by fluoroscopy. The needle was removed. Over the guidewire, the percutaneous tract was dilated using a 10 mm non-compliant balloon. The balloon was deflated, then pushed into the gastric lumen followed in concert by the 20 Fr gastrostomy tube. The retention balloon of the percutaneous gastrostomy tube was inflated with 20 mL of sterile water. The tube was withdrawn until the retention balloon was at the edge of the gastric lumen. The external bumper was brought to the abdominal wall. Contrast was injected through the gastrostomy tube, confirming intraluminal positioning. The patient tolerated the procedure  well without any immediate post-procedural complications. IMPRESSION: Technically successful placement of 20 Fr gastrostomy tube. Marliss Coots, MD [facility] Electronically Signed   By: Marliss Coots M.D.   On: 01/10/2023 09:41   DG CHEST PORT 1 VIEW  Result Date: 01/09/2023 CLINICAL DATA:  Fever EXAM: PORTABLE CHEST 1 VIEW COMPARISON:  01/01/2023 FINDINGS: Enteric tube extends into the stomach, beyond the inferior margin of the film. Stable heart size. Aortic atherosclerosis.  Coronary artery stent. No focal airspace consolidation, pleural effusion, or pneumothorax. IMPRESSION: No acute cardiopulmonary findings Electronically Signed   By: Duanne Guess D.O.   On: 01/09/2023 13:09   DG Abd Portable 1V  Result Date: 01/06/2023 CLINICAL DATA:  Encounter for feeding tube EXAM: PORTABLE ABDOMEN - 1 VIEW COMPARISON:  Abdominal CT from 2 days ago FINDINGS: Feeding tube with tip at the distal stomach. Oral contrast is seen within nondilated colon. Clear lung bases.  Coronary stenting. IMPRESSION: Feeding tube with tip at the distal stomach. Electronically Signed   By: Tiburcio Pea M.D.   On: 01/06/2023 12:52   CT ABDOMEN WO CONTRAST  Result Date: 01/05/2023 CLINICAL DATA:  dysphagia.  Preop planning for gastrostomy. EXAM: CT ABDOMEN WITHOUT CONTRAST TECHNIQUE: Multidetector CT imaging of the abdomen was performed following the standard protocol without IV contrast. RADIATION DOSE REDUCTION: This exam was performed according to the departmental dose-optimization program which includes automated exposure control, adjustment of the mA and/or kV according to patient size and/or use of iterative reconstruction technique. COMPARISON:  Swallow eval, 01/04/2023 and 12/28/2022. Chest XR, 01/01/2023. FINDINGS: Lower chest: 4.5 cm dilatation of the imaged ascending thoracic aorta. LAD stent. Severe burden of multivessel coronary atherosclerosis is present. Hepatobiliary: Normal noncontrast appearance of the liver without focal abnormality. Trace radiodense gallstones within the nondistended gallbladder. No gallbladder wall thickening, or biliary dilatation. Pancreas: No pancreatic ductal dilatation or surrounding inflammatory changes. Spleen: Normal in size without focal abnormality. Adrenals/Urinary Tract: Adrenal glands are unremarkable. Kidneys are normal, without renal calculi, focal lesion, or hydronephrosis. Stomach/Bowel: Stomach is nondistended and within normal limits. Small bore  enteric feeding tube, with tip projecting at the proximal duodenum. Intraluminal contrast opacification of the colon, previously ingested. Imaged portions of the small bowel and colon are nondistended. No evidence of bowel wall thickening, distention, or inflammatory changes. 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 Moderate-to-severe burden of aortic atherosclerosis without aneurysmal dilatation. No enlarged abdominal lymph nodes. Other: Scaphoid abdomen.  No intra-abdominal ascites Musculoskeletal: Degenerative changes spine, including mild S-shaped thoracolumbar curvature. No acute osseous findings. IMPRESSION: 1. No acute abdominal process. 2. No intervening viscera. Anatomy is amenable for percutaneous gastrostomy placement. Final decision making is reserved for the performing physician. 3. Incidental 4.5 cm ascending thoracic aortic dilatation, incompletely assessed. Ascending thoracic aortic aneurysm. Recommend semi-annual imaging followup by CTA or MRA and referral to cardiothoracic surgery if not already obtained This recommendation follows 2010 ACCF/AHA/AATS/ACR/ASA/SCA/SCAI/SIR/STS/SVM Guidelines for the Diagnosis and Management of Patients With Thoracic Aortic Disease. Circulation. 2010; 121: N829-F621. Aortic aneurysm NOS (ICD10-I71.9) 4. Aortic Atherosclerosis (ICD10-I70.0). Additional incidental, chronic and senescent findings as above. Roanna Banning, MD [facility] Electronically Signed   By: Roanna Banning M.D.   On: 01/05/2023 12:03   DG Swallowing Func-Speech Pathology  Result Date: 01/04/2023 Table formatting from the original result was not included. Modified Barium Swallow Study Patient Details Name: James Caraveo Sr. MRN: [id number] Date of Birth: Dec 30, 1931 Today's Date: 01/04/2023 HPI/PMH: HPI: Pt  is an 89 y.o. male who presented with AMS after a fall. CT head: Scattered areas of subarachnoid  hemorrhage in the left occipital  lobe, bilateral high frontal lobes, and right temporal lobe. Small amount of subarachnoid hemorrhage posterior to the right midbrain extending into the cerebral aqueduct. CTH 8/26 with decreased size and density of previously seen multifocal SAH. PMH: COPD, prior NSTEMI / CHF, NSVT, PAF, on ASA81. Clinical Impression: Clinical Impression: Pt presents with a moderate oropharyngeal dysphagia characterized by deficits related to awareness and reduced strength. Feel that this is in part due to cognition, although also significantly affected by anatomical differences. Overall, pt with poor awareness of bolus presentations, leaving mouth wide open when presented with a spoon, straw, or cup and requiring Max cueing to close mouth and initiate. With larger boluses, there is posterior progression to the level of the pyriform sinuses before he initiates the swallow. He is consistently piecemeal swallowing which affects his overall coordination. Pt presents with reduced pharyngeal strength and suspected prominent cervical osteophytes create pharyngeal narrowing at the level of the valleculae, contributing to no noted epiglottic inversion. This may also be affected by Cortrak in place, although feel this is not an acute difference. Incomplete epiglottic inversion allows the laryngeal vestibule to be open during the swallowing contributing to penetration of all consistencies during the swallow as well as after. With thicker consistencies, pt has increased pharyngeal residue collecting along the base of tongue, valleculae, pharyngeal wall, and aryepiglottic folds. Due to pt's reduced sensation and strength, this residue progresses into the laryngeal vestibule when pt is at rest. Observed penetration to the level of the vocal folds without sensation and without ability to clear after a cued cough (PAS 5) with thin liquids, nectar thick liquids, and honey thick liquids. Pureed textures were  penetrated above the vocal folds and were not able to be expelled (PAS 3) with concern for continual progression through the trachea without sensation. Overall, feel that pt's cognition does not allow for consistent use of compensatory strategies at this time. Recommend he remain strictly NPO. Will continue to follow. Factors that may increase risk of adverse event in presence of aspiration Rubye Oaks & Clearance Coots 2021): Factors that may increase risk of adverse event in presence of aspiration Rubye Oaks & Clearance Coots 2021): Poor general health and/or compromised immunity; Reduced cognitive function; Frail or deconditioned; Dependence for feeding and/or oral hygiene; Inadequate oral hygiene Recommendations/Plan: Swallowing Evaluation Recommendations Swallowing Evaluation Recommendations Recommendations: NPO Medication Administration: Via alternative means Treatment Plan Treatment Plan Treatment recommendations: Therapy as outlined in treatment plan below Follow-up recommendations: Skilled nursing-short term rehab (<3 hours/day) Functional status assessment: Patient has had a recent decline in their functional status and demonstrates the ability to make significant improvements in function in a reasonable and predictable amount of time. Treatment frequency: Min 2x/week Treatment duration: 2 weeks Interventions: Aspiration precaution training; Trials of upgraded texture/liquids; Diet toleration management by SLP; Patient/family education Recommendations Recommendations for follow up therapy are one component of a multi-disciplinary discharge planning process, led by the attending physician.  Recommendations may be updated based on patient status, additional functional criteria and insurance authorization. Assessment: Orofacial Exam: Orofacial Exam Oral Cavity: Oral Hygiene: WFL Oral Cavity - Dentition: Poor condition; Missing dentition; Dentures, top; Dentures, not available Orofacial Anatomy: WFL Oral Motor/Sensory Function: WFL  Anatomy: Anatomy: Suspected cervical osteophytes Boluses Administered: Boluses Administered Boluses Administered: Thin liquids (Level 0); Mildly thick liquids (Level 2, nectar thick); Moderately thick liquids (Level 3, honey thick); Puree  Oral Impairment  Domain: Oral Impairment Domain Lip Closure: Escape from interlabial space or lateral juncture, no extension beyond vermillion border Tongue control during bolus hold: Posterior escape of greater than half of bolus Bolus transport/lingual motion: Delayed initiation of tongue motion (oral holding) Oral residue: Residue collection on oral structures Location of oral residue : Tongue; Palate Initiation of pharyngeal swallow : Pyriform sinuses  Pharyngeal Impairment Domain: Pharyngeal Impairment Domain Soft palate elevation: No bolus between soft palate (SP)/pharyngeal wall (PW) Laryngeal elevation: Complete superior movement of thyroid cartilage with complete approximation of arytenoids to epiglottic petiole Anterior hyoid excursion: Partial anterior movement Epiglottic movement: No inversion Laryngeal vestibule closure: Incomplete, narrow column air/contrast in laryngeal vestibule Pharyngeal stripping wave : Present - diminished Pharyngeal contraction (A/P view only): N/A Pharyngoesophageal segment opening: Minimal distention/minimal duration, marked obstruction of flow Tongue base retraction: Narrow column of contrast or air between tongue base and PPW Pharyngeal residue: Majority of contrast within or on pharyngeal structures Location of pharyngeal residue: Valleculae; Pharyngeal wall; Pyriform sinuses; Aryepiglottic folds  Esophageal Impairment Domain: No data recorded Pill: No data recorded Penetration/Aspiration Scale Score: Penetration/Aspiration Scale Score 3.  Material enters airway, remains ABOVE vocal cords and not ejected out: Puree 5.  Material enters airway, CONTACTS cords and not ejected out: Thin liquids (Level 0); Mildly thick liquids (Level 2, nectar  thick); Moderately thick liquids (Level 3, honey thick) Compensatory Strategies: Compensatory Strategies Compensatory strategies: Yes Straw: Ineffective Ineffective Straw: Thin liquid (Level 0)   General Information: Caregiver present: No  Diet Prior to this Study: NPO; Cortrak/Small bore NG tube   Temperature : Normal   Respiratory Status: WFL   Supplemental O2: None (Room air)   History of Recent Intubation: No  Behavior/Cognition: Alert; Cooperative; Confused; Requires cueing Self-Feeding Abilities: Dependent for feeding Baseline vocal quality/speech: Dysphonic; Hypophonia/low volume Volitional Cough: Able to elicit Volitional Swallow: Unable to elicit Exam Limitations: Excessive movement Goal Planning: Prognosis for improved oropharyngeal function: Fair Barriers to Reach Goals: Cognitive deficits; Severity of deficits; Time post onset; Overall medical prognosis No data recorded Patient/Family Stated Goal: none stated Consulted and agree with results and recommendations: Pt unable/family or caregiver not available Pain: Pain Assessment Pain Assessment: No/denies pain Breathing: 0 Negative Vocalization: 0 Facial Expression: 0 Body Language: 0 Consolability: 0 PAINAD Score: 0 End of Session: Start Time:SLP Start Time (ACUTE ONLY): 1121 Stop Time: SLP Stop Time (ACUTE ONLY): 1138 Time Calculation:SLP Time Calculation (min) (ACUTE ONLY): 17 min Charges: SLP Evaluations $ SLP Speech Visit: 1 Visit SLP Evaluations $MBS Swallow: 1 Procedure $Swallowing Treatment: 1 Procedure $Speech Treatment for Individual: 1 Procedure SLP visit diagnosis: SLP Visit Diagnosis: Dysphagia, oropharyngeal phase (R13.12); Cognitive communication deficit (R41.841) Past Medical History: Past Medical History: Diagnosis Date  Acquired hypothyroidism 08/15/2019  Acute systolic heart failure (HCC) 06/25/2020  Anemia 02/05/2020  boarderline anemic  Atopic dermatitis 12/05/2019  Bacterial pneumonia, unspecified 06/14/2012  06/14/2012 CXR w/ RML PNA  >Zithromax and Rocephin    Bradycardia 11/28/2018  CAD (coronary artery disease) 07/07/2020  Cancer (HCC)   prostate cancer-seed implant  Closed fracture dislocation of right elbow   Closed fracture of right olecranon process 04/10/2020  COPD (chronic obstructive pulmonary disease) (HCC) 01/21/2020  Dermatitis 02/05/2020  Dizziness 02/19/2016  Essential hypertension 11/28/2018  History of prostate cancer   seed implant  Hyperlipemia   Hyperlipidemia, mixed 08/21/2008  Qualifier: Diagnosis of  By: Maple Hudson MD, Clinton D   LVH (left ventricular hypertrophy) 11/28/2018  Malnutrition of moderate degree (HCC) 08/15/2019  Mixed conductive and sensorineural hearing  loss, 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  Moderate persistent asthma, uncomplicated 01/21/2020  Nonsustained ventricular tachycardia (HCC) 01/30/2019  NSTEMI (non-ST elevated myocardial infarction) (HCC) 06/21/2020  Other fatigue 02/05/2020  PAF (paroxysmal atrial fibrillation) (HCC) 06/25/2020  Seasonal and perennial allergic rhinitis 05/17/2007     Senile osteoporosis 08/15/2019  SINUSITIS, ACUTE 06/28/2007  Qualifier: Diagnosis of  By: Clent Ridges NP, Tammy    Ventricular ectopy 11/28/2018 Past Surgical History: Past Surgical History: Procedure Laterality Date  APPENDECTOMY    CORONARY ATHERECTOMY N/A 06/24/2020  Procedure: CORONARY ATHERECTOMY;  Surgeon: Iran Ouch, MD;  [facility]: [facility];  Service: Cardiovascular;  Laterality: N/A;  CORONARY ULTRASOUND/IVUS N/A 06/24/2020  Procedure: Intravascular Ultrasound/IVUS;  Surgeon: Iran Ouch, MD;  Location: [facility];  Service: Cardiovascular;  Laterality: N/A;  LEFT HEART CATH AND CORONARY ANGIOGRAPHY N/A 06/22/2020  Procedure: LEFT HEART CATH AND CORONARY ANGIOGRAPHY;  Surgeon: Yvonne Kendall, MD;  Location: [facility];  Service: Cardiovascular;  Laterality: N/A;  ORIF ELBOW FRACTURE Right 04/10/2020  Procedure: OPEN REDUCTION INTERNAL FIXATION (ORIF) ELBOW/OLECRANON FRACTURE;  Surgeon: Teryl Lucy, MD;  Location:  [facility];  Service: Orthopedics;  Laterality: Right; Gwynneth Aliment, M.A., CF-SLP Speech Language Pathology, Acute Rehabilitation Services Secure Chat preferred [phone] 01/04/2023, 1:45 PM  CT HEAD WO CONTRAST ( )  Result Date: 01/02/2023 CLINICAL DATA:  Mental status change, persistent or worsening CT EXAM: CT HEAD WITHOUT CONTRAST TECHNIQUE: Contiguous axial images were obtained from the base of the skull through the vertex without intravenous contrast. RADIATION DOSE REDUCTION: This exam was performed according to the departmental dose-optimization program which includes automated exposure control, adjustment of the mA and/or kV according to patient size and/or use of iterative reconstruction technique. COMPARISON:  Head 12/20/2022. FINDINGS: Brain: Decreased size and density of previously seen subarachnoid hemorrhage with a few areas of persistent small hyperdensity. Intermediate density right subdural hemorrhage is decreased in thickness, now 7 mm on the right and 3 mm on the left (previously 9 and 11 mm). No substantial midline shift. No evidence of acute large vascular territory infarct, mass lesion or hydrocephalus. Cerebral atrophy and chronic microvascular ischemic change. Basal ganglia and cerebellar calcifications. Vascular: No hyperdense vessel identified. Calcific atherosclerosis. Skull: No acute fracture. Sinuses/Orbits: Clear sinuses.  No acute orbital findings. Other: No mastoid effusions. IMPRESSION: 1. Intermediate density bilateral subdural hemorrhage is decreased in thickness, now 7 mm on the right and 3 mm on the left (previously 9 and 11 mm when measured on that study). 2. Decreased size and density of previously seen multifocal subarachnoid hemorrhage. Electronically Signed   By: Feliberto Harts M.D.   On: 01/02/2023 09:27   DG CHEST PORT 1 VIEW  Result Date: 01/01/2023 CLINICAL DATA:  Cough EXAM: PORTABLE CHEST 1 VIEW COMPARISON:  12/26/2022 FINDINGS:  Atherosclerotic calcification of the aortic arch. Coronary artery stent noted. Heart size within normal limits. The lungs appear clear.  No blunting of the costophrenic angles. Severe degenerative glenohumeral arthropathy on the right with elimination of the acromial humeral distance suggesting chronic right rotator cuff tear. A feeding tube extends into the stomach and beyond the inferior margin of today's image. IMPRESSION: 1. No acute findings. 2. Feeding tube extends into the stomach and beyond the inferior margin of today's image. 3. Severe degenerative glenohumeral arthropathy on the right with chronic right rotator cuff tear. Electronically Signed   By: Gaylyn Rong M.D.   On: 01/01/2023 13:21   DG Swallowing Func-Speech Pathology  Result Date: 12/28/2022 Angela Nevin,  CCC-SLP     12/28/2022  5:38 PM Modified Barium Swallow Study Patient Details Name: Nobert Hammons Sr. MRN: [id number] Date of Birth: 1931-12-10 Today's Date: 12/28/2022 HPI/PMH: HPI: Pt is an 89 y.o. male who presented with AMS after a fall. CT head: Scattered areas of subarachnoid hemorrhage in the left occipital  lobe, bilateral high frontal lobes, and right temporal lobe. Small amount of subarachnoid hemorrhage posterior to the right midbrain extending into the cerebral aqueduct. PMH: COPD, prior NSTEMI / CHF, NSVT, PAF, on ASA81. Clinical Impression: Clinical Impression: Patient presents with a mild-moderate oral dysphagia and a moderately impaired pharyngeal phase dysphagia as per this MBS. SLP suspects that patient has a chronic dysphagia that is exacerbated by his current cognitive state. During oral phase, patient with delayed anterior to posterior transit of boluses as well as impaired awareness to boluses, leading him to bite at spoon, hold liquids in mouth and have difficulty initiating straw sips. SLP assessed his swallowing with thin liquids and nectar thick liquids. Cortrak feeding tube was present during this test.  Epiglottis appeared somewhat edematous and in addition, posterior pharyngeal wall was bulging (suspected cervical osteophytes but no radiologist present to confirm). This resulted in epiglottis making contact with posterior pharyngeal wall during swallow which in turn caused impacted pharyngeal clearance of boluses. After initial swallows of nectar thick and thin liquids, patient with moderate amount of barium remaining in vallecular sinus, posterior pharyngeal wall and pyriform sinus. Patient was frequently moving around and sliding down in chair, which prevented a clear view of PES or trachea. There was an instance of penetration above vocal cords visualized one time and suspected to be occuring frequently. SLP recommending continue NPO at this time but allow ice chips, water sips PRN. Plan for repeat MBS when patient's cognition has improved. Factors that may increase risk of adverse event in presence of aspiration [employer]ye [employer]aks [employer]ance Coots 2021): Factors that may increase risk of adverse event in presence of aspiration Rubye Oaks [employer] Clearance Coots 2021): Poor general health and/or compromised immunity; Reduced cognitive function; Frail or deconditioned; Dependence for feeding and/or oral hygiene; Inadequate oral hygiene Recommendations/Plan: Swallowing Evaluation Recommendations Swallowing Evaluation Recommendations Recommendations: NPO Medication Administration: Via alternative means Oral care recommendations: Oral care QID (4x/day); Oral care before ice chips/water; Staff/trained caregiver to provide oral care Treatment Plan Treatment Plan Treatment recommendations: Therapy as outlined in treatment plan below Follow-up recommendations: Skilled nursing-short term rehab (<3 hours/day) Functional status assessment: Patient has had a recent decline in their functional status and demonstrates the ability to make significant improvements in function in a reasonable and predictable amount of time. Treatment frequency: Min 2x/week  Treatment duration: 2 weeks Interventions: Aspiration precaution training; Trials of upgraded texture/liquids; Diet toleration management by SLP; Patient/family education Recommendations Recommendations for follow up therapy are one component of a multi-disciplinary discharge planning process, led by the attending physician.  Recommendations may be updated based on patient status, additional functional criteria and insurance authorization. Assessment: Orofacial Exam: Orofacial Exam Oral Cavity: Oral Hygiene: WFL Oral Cavity - Dentition: Poor condition; Missing dentition; Dentures, top; Dentures, not available Orofacial Anatomy: WFL Anatomy: Anatomy: Suspected cervical osteophytes Boluses Administered: Boluses Administered Boluses Administered: Thin liquids (Level 0); Mildly thick liquids (Level 2, nectar thick)  Oral Impairment Domain: Oral Impairment Domain Lip Closure: No labial escape Tongue control during bolus hold: Not tested Bolus preparation/mastication: Slow prolonged chewing/mashing with complete recollection Bolus transport/lingual motion: Delayed initiation of tongue motion (oral holding) Oral residue: Residue collection on oral structures Location of  oral residue : Tongue; Palate Initiation of pharyngeal swallow : Valleculae  Pharyngeal Impairment Domain: Pharyngeal Impairment Domain Laryngeal vestibule closure: None, wide column air/contrast in laryngeal vestibule Pharyngeal stripping wave : Present - diminished Pharyngeal contraction (A/P view only): N/A Pharyngoesophageal segment opening: Partial distention/partial duration, partial obstruction of flow Tongue base retraction: Narrow column of contrast or air between tongue base and PPW Pharyngeal residue: Majority of contrast within or on pharyngeal structures Location of pharyngeal residue: Valleculae; Pharyngeal wall; Pyriform sinuses; Aryepiglottic folds  Esophageal Impairment Domain: No data recorded Pill: No data recorded Penetration/Aspiration  Scale Score: Penetration/Aspiration Scale Score 3.  Material enters airway, remains ABOVE vocal cords and not ejected out: Mildly thick liquids (Level 2, nectar thick); Thin liquids (Level 0) Compensatory Strategies: Compensatory Strategies Compensatory strategies: No   General Information: Caregiver present: No  Diet Prior to this Study: NPO   Temperature : Normal   Respiratory Status: WFL   Supplemental O2: None (Room air)   History of Recent Intubation: No  Behavior/Cognition: Alert; Cooperative; Confused; Requires cueing; Doesn't follow directions Self-Feeding Abilities: Dependent for feeding Baseline vocal quality/speech: Not observed Volitional Cough: Unable to elicit Volitional Swallow: Unable to elicit Exam Limitations: Poor participation Goal Planning: Prognosis for improved oropharyngeal function: Good Barriers to Reach Goals: Cognitive deficits; Severity of deficits No data recorded Patient/Family Stated Goal: none stated Consulted and agree with results and recommendations: Pt unable/family or caregiver not available Pain: Pain Assessment Pain Assessment: Faces Faces Pain Scale: 0 Breathing: 0 Negative Vocalization: 0 Facial Expression: 0 Body Language: 0 Consolability: 0 PAINAD Score: 0 Pain Location: L leg with PROM Pain Descriptors / Indicators: Discomfort; Grimacing; Guarding Pain Intervention(s): Limited activity within patient's tolerance; Monitored during session; Repositioned End of Session: Start Time:SLP Start Time (ACUTE ONLY): 1400 Stop Time: SLP Stop Time (ACUTE ONLY): 1417 Time Calculation:SLP Time Calculation (min) (ACUTE ONLY): 17 min Charges: SLP Evaluations $ SLP Speech Visit: 1 Visit SLP Evaluations $Swallowing Treatment: 1 Procedure SLP visit diagnosis: SLP Visit Diagnosis: Dysphagia, oropharyngeal phase (R13.12) Past Medical History: Past Medical History: Diagnosis Date  Acquired hypothyroidism 08/15/2019  Acute systolic heart failure (HCC) 06/25/2020  Anemia 02/05/2020  boarderline  anemic  Atopic dermatitis 12/05/2019  Bacterial pneumonia, unspecified 06/14/2012  06/14/2012 CXR w/ RML PNA >Zithromax and Rocephin    Bradycardia 11/28/2018  CAD (coronary artery disease) 07/07/2020  Cancer (HCC)   prostate cancer-seed implant  Closed fracture dislocation of right elbow   Closed fracture of right olecranon process 04/10/2020  COPD (chronic obstructive pulmonary disease) (HCC) 01/21/2020  Dermatitis 02/05/2020  Dizziness 02/19/2016  Essential hypertension 11/28/2018  History of prostate cancer   seed implant  Hyperlipemia   Hyperlipidemia, mixed 08/21/2008  Qualifier: Diagnosis of  By: Maple Hudson MD, Clinton D   LVH (left ventricular hypertrophy) 11/28/2018  Malnutrition of moderate degree (HCC) 08/15/2019  Mixed conductive and sensorineural hearing loss, 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  Moderate persistent asthma, uncomplicated 01/21/2020  Nonsustained ventricular tachycardia (HCC) 01/30/2019  NSTEMI (non-ST elevated myocardial infarction) (HCC) 06/21/2020  Other fatigue 02/05/2020  PAF (paroxysmal atrial fibrillation) (HCC) 06/25/2020  Seasonal and perennial allergic rhinitis 05/17/2007     Senile osteoporosis 08/15/2019  SINUSITIS, ACUTE 06/28/2007  Qualifier: Diagnosis of  By: Clent Ridges NP, Tammy    Ventricular ectopy 11/28/2018 Past Surgical History: Past Surgical History: Procedure Laterality Date  APPENDECTOMY    CORONARY ATHERECTOMY N/A 06/24/2020  Procedure: CORONARY ATHERECTOMY;  Surgeon: Iran Ouch, MD;  [facility]: [facility];  Service: Cardiovascular;  Laterality: N/A;  CORONARY ULTRASOUND/IVUS  N/A 06/24/2020  Procedure: Intravascular Ultrasound/IVUS;  Surgeon: Iran Ouch, MD;  [facility]: [facility];  Service: Cardiovascular;  Laterality: N/A;  LEFT HEART CATH AND CORONARY ANGIOGRAPHY N/A 06/22/2020  Procedure: LEFT HEART CATH AND CORONARY ANGIOGRAPHY;  Surgeon: Yvonne Kendall, MD;  Location: [facility];  Service: Cardiovascular;  Laterality: N/A;  ORIF ELBOW FRACTURE Right 04/10/2020  Procedure:  OPEN REDUCTION INTERNAL FIXATION (ORIF) ELBOW/OLECRANON FRACTURE;  Surgeon: Teryl Lucy, MD;  Location: [facility];  Service: Orthopedics;  Laterality: Right; Angela Nevin, MA, CCC-SLP Speech Therapy   DG CHEST PORT 1 VIEW  Result Date: 12/26/2022 CLINICAL DATA:  Fever EXAM: PORTABLE CHEST 1 VIEW COMPARISON:  12/17/2022 FINDINGS: Mild right basilar atelectasis. Lungs are otherwise clear. No pneumothorax or pleural effusion. Cardiac size is within normal limits. Previously noted trace perihilar interstitial pulmonary edema has resolved. Nasoenteric feeding tube has been placed, extending into the upper abdomen beyond the margin of the examination. IMPRESSION: 1. Mild right basilar atelectasis. Electronically Signed   By: Helyn Numbers M.D.   On: 12/26/2022 19:39   CT HEAD WO CONTRAST ( )  Result Date: 12/20/2022 CLINICAL DATA:  Subarachnoid hemorrhage [facility]) EXAM: CT HEAD WITHOUT CONTRAST TECHNIQUE: Contiguous axial images were obtained from the base of the skull through the vertex without intravenous contrast. RADIATION DOSE REDUCTION: This exam was performed according to the departmental dose-optimization program which includes automated exposure control, adjustment of the mA and/or kV according to patient size and/or use of iterative reconstruction technique. COMPARISON:  CT head 12/15/2022 FINDINGS: Brain: Cerebral ventricle sizes are concordant with the degree of cerebral volume loss. Patchy and confluent areas of decreased attenuation are noted throughout the deep and periventricular white matter of the cerebral hemispheres bilaterally, compatible with chronic microvascular ischemic disease. No evidence of large-territorial acute infarction. Question slightly increased in size (limited evaluation due to motion artifact) high frontal bilateral, right greater than left, subarachnoid hemorrhages with question intraparenchymal component. Associated developing mild vasogenic edema on  the right. Underlying mass is not excluded. Interval development of a 3 mm right parafalcine subdural hematoma (5:22). Nonspecific bilateral basal ganglia mineralization. No mass effect or midline shift. No hydrocephalus. Basilar cisterns are patent. Vascular: No hyperdense vessel. Atherosclerotic calcifications are present within the cavernous internal carotid arteries. Skull: No acute fracture or focal lesion. Sinuses/Orbits: Paranasal sinuses and mastoid air cells are clear. Bilateral lens replacement. Otherwise the orbits are unremarkable. Other: None. IMPRESSION: 1. Interval development of a 3 mm right parafalcine subdural hematoma. 2. Question slightly increased in size (limited evaluation due to motion artifact) high frontal bilateral, right greater than left, subarachnoid hemorrhages with question intraparenchymal component. Associated developing mild vasogenic edema on the right. Underlying mass is not excluded. Recommend MRI brain with and without contrast for further evaluation. 3. Unable to evaluate for previously visualized left occipital and right temporal cirrhotic no hemorrhages due to motion artifact. These results will be called to the ordering clinician or representative by the Radiologist Assistant, and communication documented in the PACS or [employer]. Electronically Signed   By: Tish Frederickson M.D.   On: 12/20/2022 19:22   ECHOCARDIOGRAM COMPLETE  Result Date: 12/19/2022    ECHOCARDIOGRAM REPORT   Patient Name:   James Enriguez Sr. Date of Exam: 12/19/2022 Medical Rec #:  [id number]             Height:       72.0 in Accession #:    [id number]  Weight:       140.0 lb Date of Birth:  04/28/1932              BSA:          1.831 m Patient Age:    89 years              BP:           160/92 mmHg Patient Gender: M                     HR:           97 bpm. Exam Location:  Inpatient Procedure: 2D Echo, Cardiac Doppler and Color Doppler Indications:    Atrial Fibrillation I48.91   History:        Patient has prior history of Echocardiogram examinations, most                 recent 06/23/2020. CHF and Cardiomyopathy, CAD and Previous                 Myocardial Infarction, COPD, Arrythmias:PVC, Tachycardia and                 Atrial Fibrillation; Risk Factors:Hypertension.  Sonographer:    Lucendia Herrlich Referring Phys: [id number] AVWUJW POKHREL  Sonographer Comments: Image acquisition challenging due to patient behavioral factors. IMPRESSIONS  1. Left ventricular ejection fraction, by estimation, is 50 to 55%. The left ventricle has low normal function. The left ventricle has no regional wall motion abnormalities. Left ventricular diastolic parameters are indeterminate.  2. Right ventricular systolic function is normal. The right ventricular size is normal.  3. The mitral valve is normal in structure. Moderate mitral valve regurgitation. No evidence of mitral stenosis.  4. The aortic valve is normal in structure. Aortic valve regurgitation is not visualized. Mild aortic valve stenosis.  5. There is mild dilatation of the aortic root, measuring 37 mm.  6. The inferior vena cava is normal in size with greater than 50% respiratory variability, suggesting right atrial pressure of 3 mmHg. FINDINGS  Left Ventricle: Left ventricular ejection fraction, by estimation, is 50 to 55%. The left ventricle has low normal function. The left ventricle has no regional wall motion abnormalities. The left ventricular internal cavity size was normal in size. There is no left ventricular hypertrophy. Left ventricular diastolic parameters are indeterminate. Right Ventricle: The right ventricular size is normal. No increase in right ventricular wall thickness. Right ventricular systolic function is normal. Left Atrium: Left atrial size was normal in size. Right Atrium: Right atrial size was normal in size. Pericardium: There is no evidence of pericardial effusion. Mitral Valve: The mitral valve is normal in structure.  Moderate mitral valve regurgitation. No evidence of mitral valve stenosis. Tricuspid Valve: The tricuspid valve is normal in structure. Tricuspid valve regurgitation is not demonstrated. No evidence of tricuspid stenosis. Aortic Valve: The aortic valve is normal in structure. Aortic valve regurgitation is not visualized. Aortic regurgitation PHT measures 740 msec. Mild aortic stenosis is present. Aortic valve mean gradient measures 9.0 mmHg. Aortic valve peak gradient measures 17.6 mmHg. Aortic valve area, by VTI measures 1.66 cm. Pulmonic Valve: The pulmonic valve was normal in structure. Pulmonic valve regurgitation is not visualized. No evidence of pulmonic stenosis. Aorta: There is mild dilatation of the aortic root, measuring 37 mm. Venous: The inferior vena cava is normal in size with greater than 50% respiratory variability, suggesting right atrial pressure of 3 mmHg. IAS/Shunts: No atrial  level shunt detected by color flow Doppler.  LEFT VENTRICLE PLAX 2D LVIDd:         4.00 cm   Diastology LVIDs:         2.90 cm   LV e' medial:    12.40 cm/s LV PW:         0.70 cm   LV E/e' medial:  9.0 LV IVS:        1.00 cm   LV e' lateral:   13.50 cm/s LVOT diam:     2.00 cm   LV E/e' lateral: 8.2 LV SV:         52 LV SV Index:   28 LVOT Area:     3.14 cm  RIGHT VENTRICLE             IVC RV S prime:     20.50 cm/s  IVC diam: 1.35 cm TAPSE (M-mode): 1.8 cm LEFT ATRIUM             Index        RIGHT ATRIUM           Index LA diam:        4.00 cm 2.18 cm/m   RA Area:     16.50 cm LA Vol (A2C):   49.4 ml 26.98 ml/m  RA Volume:   40.00 ml  21.85 ml/m LA Vol (A4C):   52.5 ml 28.67 ml/m LA Biplane Vol: 51.2 ml 27.96 ml/m  AORTIC VALVE AV Area (Vmax):    1.49 cm AV Area (Vmean):   1.46 cm AV Area (VTI):     1.66 cm AV Vmax:           210.00 cm/s AV Vmean:          136.000 cm/s AV VTI:            0.311 m AV Peak Grad:      17.6 mmHg AV Mean Grad:      9.0 mmHg LVOT Vmax:         99.40 cm/s LVOT Vmean:        63.367  cm/s LVOT VTI:          0.165 m LVOT/AV VTI ratio: 0.53 AI PHT:            740 msec  AORTA Ao Root diam: 3.70 cm MITRAL VALVE                TRICUSPID VALVE MV Area (PHT): 5.31 cm     TR Peak grad:   36.7 mmHg MV Decel Time: 143 msec     TR Vmax:        303.00 cm/s MR Peak grad: 147.4 mmHg MR Vmax:      607.00 cm/s   SHUNTS MV E velocity: 111.00 cm/s  Systemic VTI:  0.16 m MV A velocity: 123.00 cm/s  Systemic Diam: 2.00 cm MV E/A ratio:  0.90 Kardie Tobb DO Electronically signed by Thomasene Ripple DO Signature Date/Time: 12/19/2022/1:37:51 PM    Final    DG Abd Portable 1V  Result Date: 12/19/2022 CLINICAL DATA:  Feeding tube placement EXAM: PORTABLE ABDOMEN - 1 VIEW COMPARISON:  Chest radiograph done on 12/17/2022 FINDINGS: Tip of feeding tube is seen in the region of the antrum of the stomach. Bowel gas pattern in the upper abdomen is unremarkable. Pelvis is not included in its entirety. Right side of the abdomen is not included in the image. Visualized lung fields are clear. Coronary artery stent  is seen. IMPRESSION: Tip of feeding tube is seen in the region of the antrum of the stomach. Electronically Signed   By: Ernie Avena M.D.   On: 12/19/2022 11:05    Microbiology: Results for orders placed or performed during the hospital encounter of 12/15/22  Urine Culture     Status: Abnormal   Collection Time: 12/17/22  8:20 PM   Specimen: Urine, Catheterized  Result Value Ref Range Status   Specimen Description URINE, CATHETERIZED  Final   Special Requests NONE  Final   Culture (A)  Final    >=100,000 COLONIES/mL ESCHERICHIA COLI Two isolates with different morphologies were identified as the same organism.The most resistant organism was reported. Performed at [facility], [address], [place], [place] [address]    Report Status 12/20/2022 FINAL  Final   Organism ID, Bacteria ESCHERICHIA COLI (A)  Final      Susceptibility   Escherichia coli - MIC*    AMPICILLIN >=32 RESISTANT  Resistant     CEFAZOLIN >=64 RESISTANT Resistant     CEFEPIME <=0.12 SENSITIVE Sensitive     CEFTRIAXONE 0.5 SENSITIVE Sensitive     CIPROFLOXACIN <=0.25 SENSITIVE Sensitive     GENTAMICIN <=1 SENSITIVE Sensitive     IMIPENEM <=0.25 SENSITIVE Sensitive     NITROFURANTOIN 64 INTERMEDIATE Intermediate     TRIMETH/SULFA <=20 SENSITIVE Sensitive     PIP/TAZO <=4 SENSITIVE Sensitive     * >=100,000 COLONIES/mL ESCHERICHIA COLI  Resp panel by RT-PCR (RSV, Flu A&B, Covid) Anterior Nasal Swab     Status: None   Collection Time: 12/17/22 10:29 PM   Specimen: Anterior Nasal Swab  Result Value Ref Range Status   SARS Coronavirus 2 by RT PCR NEGATIVE NEGATIVE Final   Influenza A by PCR NEGATIVE NEGATIVE Final   Influenza B by PCR NEGATIVE NEGATIVE Final    Comment: (NOTE) The Xpert Xpress SARS-CoV-2/FLU/RSV plus assay is intended as an aid in the diagnosis of influenza from Nasopharyngeal swab specimens and should not be used as a sole basis for treatment. Nasal washings and aspirates are unacceptable for Xpert Xpress SARS-CoV-2/FLU/RSV testing.  Fact Sheet for Patients: BloggerCourse.com  Fact Sheet for Healthcare Providers: SeriousBroker.it  This test is not yet approved or cleared by the [place] FDA and has been authorized for detection and/or diagnosis of SARS-CoV-2 by FDA under an Emergency Use Authorization (EUA). This EUA will remain in effect (meaning this test can be used) for the duration of the COVID-19 declaration under Section 564(b)(1) of the Act, 21 U.S.C. section 360bbb-3(b)(1), unless the authorization is terminated or revoked.     Resp Syncytial Virus by PCR NEGATIVE NEGATIVE Final    Comment: (NOTE) Fact Sheet for Patients: BloggerCourse.com  Fact Sheet for Healthcare Providers: SeriousBroker.it  This test is not yet approved or cleared by the [place]  FDA and has been authorized for detection and/or diagnosis of SARS-CoV-2 by FDA under an Emergency Use Authorization (EUA). This EUA will remain in effect (meaning this test can be used) for the duration of the COVID-19 declaration under Section 564(b)(1) of the Act, 21 U.S.C. section 360bbb-3(b)(1), unless the authorization is terminated or revoked.  Performed at [facility], [address], [place], [place] [address]   Culture, Urine (Do not remove urinary catheter, catheter placed by urology or difficult to place)     Status: None   Collection Time: 12/26/22  2:54 PM   Specimen: Urine, Catheterized  Result Value Ref Range  Status   Specimen Description URINE, CATHETERIZED  Final   Special Requests NONE  Final   Culture   Final    NO GROWTH Performed at [facility], [address] [place], [place] [address]    Report Status 12/28/2022 FINAL  Final  Culture, blood (Routine X 2) w Reflex to ID Panel     Status: None   Collection Time: 12/26/22  3:32 PM   Specimen: BLOOD RIGHT ARM  Result Value Ref Range Status   Specimen Description BLOOD RIGHT ARM  Final   Special Requests   Final    BOTTLES DRAWN AEROBIC AND ANAEROBIC Blood Culture adequate volume   Culture   Final    NO GROWTH 5 DAYS Performed at [facility], [address], [place], [place] [address]    Report Status 12/31/2022 FINAL  Final  Culture, blood (Routine X 2) w Reflex to ID Panel     Status: None   Collection Time: 12/26/22  3:33 PM   Specimen: BLOOD LEFT WRIST  Result Value Ref Range Status   Specimen Description BLOOD LEFT WRIST  Final   Special Requests   Final    BOTTLES DRAWN AEROBIC AND ANAEROBIC Blood Culture adequate volume   Culture   Final    NO GROWTH 5 DAYS Performed at [facility], [address] [place], [place] [address]    Report Status 12/31/2022 FINAL  Final  Culture, Urine (Do not remove urinary catheter, catheter placed by urology or difficult to place)      Status: Abnormal   Collection Time: 01/09/23 11:15 AM   Specimen: Urine, Catheterized  Result Value Ref Range Status   Specimen Description URINE, CATHETERIZED  Final   Special Requests   Final    NONE Performed at [facility], [address], [place], [place] [address]    Culture (A)  Final    >=100,000 COLONIES/mL ESCHERICHIA COLI >=100,000 COLONIES/mL ENTEROBACTER CLOACAE    Report Status 01/11/2023 FINAL  Final   Organism ID, Bacteria ESCHERICHIA COLI (A)  Final   Organism ID, Bacteria ENTEROBACTER CLOACAE (A)  Final      Susceptibility   Enterobacter cloacae - MIC*    CEFEPIME 2 SENSITIVE Sensitive     CIPROFLOXACIN <=0.25 SENSITIVE Sensitive     GENTAMICIN <=1 SENSITIVE Sensitive     IMIPENEM <=0.25 SENSITIVE Sensitive     NITROFURANTOIN 64 INTERMEDIATE Intermediate     TRIMETH/SULFA <=20 SENSITIVE Sensitive     PIP/TAZO >=128 RESISTANT Resistant     * >=100,000 COLONIES/mL ENTEROBACTER CLOACAE   Escherichia coli - MIC*    AMPICILLIN >=32 RESISTANT Resistant     CEFAZOLIN <=4 SENSITIVE Sensitive     CEFEPIME <=0.12 SENSITIVE Sensitive     CEFTRIAXONE <=0.25 SENSITIVE Sensitive     CIPROFLOXACIN <=0.25 SENSITIVE Sensitive     GENTAMICIN <=1 SENSITIVE Sensitive     IMIPENEM <=0.25 SENSITIVE Sensitive     NITROFURANTOIN <=16 SENSITIVE Sensitive     TRIMETH/SULFA <=20 SENSITIVE Sensitive     AMPICILLIN/SULBACTAM 16 INTERMEDIATE Intermediate     PIP/TAZO <=4 SENSITIVE Sensitive     * >=100,000 COLONIES/mL ESCHERICHIA COLI  Urine Culture (for pregnant, neutropenic or urologic patients or patients with an indwelling urinary catheter)     Status: Abnormal   Collection Time: 01/12/23 12:06 PM   Specimen: Urine, Catheterized  Result Value Ref Range Status   Specimen Description URINE, CATHETERIZED  Final   Special Requests NONE  Final   Culture (A)  Final    <10,000 COLONIES/mL INSIGNIFICANT GROWTH Performed at [facility], [address] [place], [place]  [address]    Report Status 01/13/2023 FINAL  Final    Labs: CBC: Recent Labs  Lab 01/11/23 0528 01/12/23 1138 01/14/23 0820  WBC 12.5* 11.1* 10.5  HGB 11.3* 10.9* 10.9*  HCT 35.3* 33.5* 34.4*  MCV 95.4 97.4 95.0  PLT 343 310 301   Basic Metabolic Panel: Recent Labs  Lab 01/11/23 0528 01/12/23 1138 01/14/23 0820  NA 133* 134* 131*  K 3.9 4.9 4.6  CL 101 100 102  CO2 20* 24 23  GLUCOSE 121* 117* 109*  BUN 18 23 26*  CREATININE 0.62 0.65 0.73  CALCIUM 8.5* 8.4* 8.1*   Liver Function Tests: No results for input(s): "AST", "ALT", "ALKPHOS", "BILITOT", "PROT", "ALBUMIN" in the last 168 hours. CBG: Recent Labs  Lab 01/16/23 1944 01/16/23 2351 01/17/23 0350 01/17/23 0807 01/17/23 1146  GLUCAP 134* 129* 134* 100* 114*    Discharge time spent: greater than 30 minutes.  Signed: Alba Cory, MD [facility] 01/17/2023

## 2023-01-17 NOTE — Plan of Care (Signed)

## 2023-01-17 NOTE — Care Management Important Message (Signed)
Important Message  Patient Details  Name: James Vailes Sr. MRN: [id number] Date of Birth: 29-Aug-1931   Medicare Important Message Given:  Yes     [name] 01/17/2023, 12:35 PM

## 2023-01-17 NOTE — Progress Notes (Signed)
Physical Therapy Treatment Patient Details Name: James Maye Sr. MRN: [id number] DOB: 12/09/1931 Today's Date: 01/17/2023   History of Present Illness Pt is an 89 y.o. male who presented 12/15/22 s/p fall with head trauma. CTH shows diffuse scattered traumatic pattern subarachnoid hemorrhage. Cortrak placed 8/12. Peg 01/10/2023. PMHx includes COPD, prior NSTEMI / CHF, NSVT, PAF, anemia, bradycardia, senile osteoporosis, CAD, HLD, HTN    PT Comments  Patient resting in bed comfortably at start of session and able to verbalize simple statements of 1-2 words; pt willing to participate in therapy. Supine>sit transfer required Max+2 assist as pt has strong extensor tone in LE's Lt>Rt and posterior lean with pivot to sit EOB. Manual pressure/cue required to reduce extensor tone at knee and hips to allow for feet placement on floor and for anterior trunk lean. Cue for pt to rest head on therapist shoulder anteriorly provided greatest improvement in anterior trunk weight shift and improved posture. Oral hygiene performed at EOB, pt tending to bite on foam tip suction toothbrush due to cognitive limitations with decreased recognition of self care task. Pt able to track therapist and attend to cues intermittently, <10% of time. Max+2 to return to supine at EOB and reposition for comfort. Continue to recommend post acute rehab <3 hours/day. Will continue to progress as able.   If plan is discharge home, recommend the following: Two people to help with walking and/or transfers;Two people to help with bathing/dressing/bathroom;Direct supervision/assist for financial management;Direct supervision/assist for medications management;Assistance with feeding;Help with stairs or ramp for entrance;Assist for transportation;Assistance with cooking/housework;Supervision due to cognitive status   Can travel by private vehicle     No  Equipment Recommendations   (defer to next venue)    Recommendations for Other Services        Precautions / Restrictions Precautions Precautions: Fall;Other (comment) Precaution Comments: PEG; abdominal binder watch HR Restrictions Weight Bearing Restrictions: No     Mobility  Bed Mobility Overal bed mobility: Needs Assistance Bed Mobility: Supine to Sit, Sit to Supine     Supine to sit: Max assist, +2 for physical assistance, +2 for safety/equipment Sit to supine: Max assist, +2 for physical assistance, +2 for safety/equipment   General bed mobility comments: Pt minimally attempting to assist. Max+2 to safely position LE's and raise trunk upright. bed pad used to pivot    Transfers                   General transfer comment: Did not attempt    Ambulation/Gait                   Stairs             Wheelchair Mobility     Tilt Bed    Modified Rankin (Stroke Patients Only)       Balance Overall balance assessment: Needs assistance Sitting-balance support: Feet supported Sitting balance-Leahy Scale: Poor Sitting balance - Comments: cue for forward flexion at head/trunk with pt resting head on thearpist's shoulder improved anterior weigth shift and pt fluctuating from min-mod assist for seated balance with manual blocking of LE's to keep feet on floor. Pt extending Lt LE in sitting EOB. Postural control: Posterior lean                                  Cognition Arousal: Alert Behavior During Therapy: Restless, Flat affect Overall Cognitive Status: Impaired/Different from baseline Area of  Impairment: Attention, Memory, Following commands, Safety/judgement, Awareness, Problem solving               Rancho Levels of Cognitive Functioning [place] Scales of Cognitive Functioning: Confused, Inappropriate Non-Agitated: Maximal Assistance Orientation Level: Place, Time, Situation, Disoriented to Current Attention Level: Focused Memory: Decreased recall of precautions, Decreased short-term memory Following  Commands: Follows one step commands inconsistently, Follows one step commands with increased time Safety/Judgement: Decreased awareness of safety, Decreased awareness of deficits Awareness: Intellectual Problem Solving: Slow processing, Decreased initiation, Difficulty sequencing, Requires verbal cues, Requires tactile cues General Comments: Pt following some commands, making eye contact with cues, but often not fully comprehending what was going on and requests being made of him. responded well to singing and simple positive phrases   [email or web address] Scales of Cognitive Functioning: Confused, Inappropriate Non-Agitated: Maximal Assistance [V]    Exercises      General Comments        Pertinent Vitals/Pain Pain Assessment Pain Assessment: Faces Faces Pain Scale: Hurts even more Pain Location: Bil legs with ROM L>R, and Bil hands Pain Descriptors / Indicators: Discomfort, Grimacing Pain Intervention(s): Limited activity within patient's tolerance, Monitored during session, Repositioned    Home Living                          Prior Function            PT Goals (current goals can now be found in the care plan section) Acute Rehab PT Goals PT Goal Formulation: With patient/family Time For Goal Achievement: 01/20/23 Potential to Achieve Goals: Fair Progress towards PT goals: Progressing toward goals    Frequency    Min 1X/week      PT Plan      Co-evaluation PT/OT/SLP Co-Evaluation/Treatment: Yes Reason for Co-Treatment: Necessary to address cognition/behavior during functional activity;For patient/therapist safety;To address functional/ADL transfers PT goals addressed during session: Mobility/safety with mobility;Balance;Strengthening/ROM OT goals addressed during session: ADL's and self-care;Strengthening/ROM      AM-PAC PT "6 Clicks" Mobility   Outcome Measure  Help needed turning from your back to your side while in a flat bed without using  bedrails?: A Lot Help needed moving from lying on your back to sitting on the side of a flat bed without using bedrails?: Total Help needed moving to and from a bed to a chair (including a wheelchair)?: Total Help needed standing up from a chair using your arms (e.g., wheelchair or bedside chair)?: Total Help needed to walk in hospital room?: Total Help needed climbing 3-5 steps with a railing? : Total 6 Click Score: 7    End of Session Equipment Utilized During Treatment: Gait belt Activity Tolerance: Patient tolerated treatment well Patient left: in bed;with call bell/phone within reach;with bed alarm set;with family/visitor present Nurse Communication: Mobility status (PEG leaking) PT Visit Diagnosis: Other abnormalities of gait and mobility (R26.89);Muscle weakness (generalized) (M62.81);Other symptoms and signs involving the nervous system (R29.898);Pain;Unsteadiness on feet (R26.81);Difficulty in walking, not elsewhere classified (R26.2)     Time: 1914-7829 PT Time Calculation (min) (ACUTE ONLY): 34 min  Charges:    $Therapeutic Activity: 8-22 mins PT General Charges $$ ACUTE PT VISIT: 1 Visit                     Wynn Maudlin, DPT Acute Rehabilitation Services Office [phone]  01/17/23 4:45 PM

## 2023-01-17 NOTE — Progress Notes (Signed)
Occupational Therapy Treatment Patient Details Name: James Maris Sr. MRN: [id number] DOB: [birth year] Today's Date: 01/17/2023   History of present illness Pt is an 89 y.o. male who presented 12/15/22 s/p fall with head trauma. CTH shows diffuse scattered traumatic pattern subarachnoid hemorrhage. Cortrak placed 8/12. Peg 01/10/2023. PMHx includes COPD, prior NSTEMI / CHF, NSVT, PAF, anemia, bradycardia, senile osteoporosis, CAD, HLD, HTN   OT comments  New goals established and down graded. Pt continues to be total care for ADL - today focused on hand over hand for grooming tasks and maintaining seated balance EOB in preparation for ADL tasks. Pt verbalizing today making fleeting eye contact (on Pt's R better than L) and following 10% of commands. OT will continue to follow acutely, <3 hours rehab setting remains most appropriate dc venue.       If plan is discharge home, recommend the following:  Two people to help with walking and/or transfers;Two people to help with bathing/dressing/bathroom;Assistance with cooking/housework;Assistance with feeding;Direct supervision/assist for medications management;Direct supervision/assist for financial management;Assist for transportation   Equipment Recommendations  Other (comment) (defer to next venue of care)    Recommendations for Other Services      Precautions / Restrictions Precautions Precautions: Fall;Other (comment) Precaution Comments: PEG; abdominal binder watch HR Restrictions Weight Bearing Restrictions: No       Mobility Bed Mobility Overal bed mobility: Needs Assistance Bed Mobility: Supine to Sit, Sit to Supine     Supine to sit: Max assist, +2 for physical assistance, +2 for safety/equipment Sit to supine: Max assist, +2 for physical assistance, +2 for safety/equipment   General bed mobility comments: Pt minimally attempting to assist, but ultimately performing <25% of tasks, multimodal cueing used throughout     Transfers                   General transfer comment: Did not attempt     Balance Overall balance assessment: Needs assistance Sitting-balance support: Feet supported Sitting balance-Leahy Scale: Poor   Postural control: Posterior lean                                 ADL either performed or assessed with clinical judgement   ADL Overall ADL's : Needs assistance/impaired     Grooming: Oral care;Wash/dry face;Maximal assistance;Sitting;Bed level Grooming Details (indicate cue type and reason): hand over hand, but resistive- did better by demonstration of "open mouth aaahhhhh" and Pt would hold mouth open for oral care by therapist, initiated hand over hand with wash cloth too and Pt did not engage in task but would follow commands for close your eyes so therapist could wash around eyes Upper Body Bathing: Maximal assistance;Sitting           Lower Body Dressing: Total assistance               Functional mobility during ADLs: Total assistance;+2 for physical assistance;+2 for safety/equipment General ADL Comments: total A    Extremity/Trunk Assessment              Vision       Perception     Praxis      Cognition Arousal: Alert Behavior During Therapy: Restless, Flat affect Overall Cognitive Status: Impaired/Different from baseline Area of Impairment: Attention, Memory, Following commands, Safety/judgement, Awareness, Problem solving               Rancho Levels of Cognitive Functioning [place]  Scales of Cognitive Functioning: Confused, Inappropriate Non-Agitated: Maximal Assistance Orientation Level: Place, Time, Situation, Disoriented to Current Attention Level: Focused Memory: Decreased recall of precautions, Decreased short-term memory Following Commands: Follows one step commands inconsistently, Follows one step commands with increased time Safety/Judgement: Decreased awareness of safety, Decreased awareness of  deficits Awareness: Intellectual Problem Solving: Slow processing, Decreased initiation, Difficulty sequencing, Requires verbal cues, Requires tactile cues General Comments: Pt following some commands, making eye contact with cues, but often not fully comprehending what was going on and requests being made of him. responded well to singing and simple positive phrases [email or web address] Scales of Cognitive Functioning: Confused, Inappropriate Non-Agitated: Maximal Assistance [V]      Exercises      Shoulder Instructions       General Comments daughter present at end of session, HR up to 150's with seated activities    Pertinent Vitals/ Pain       Pain Assessment Pain Assessment: Faces Faces Pain Scale: Hurts even more Pain Location: Bil legs with ROM L>R, and Bil hands Pain Descriptors / Indicators: Discomfort, Grimacing Pain Intervention(s): Limited activity within patient's tolerance, Monitored during session, Repositioned  Home Living                                          Prior Functioning/Environment              Frequency  Min 1X/week        Progress Toward Goals  OT Goals(current goals can now be found in the care plan section)  Progress towards OT goals: Goals drowngraded-see care plan;Not progressing toward goals - comment (very limited progress)  Acute Rehab OT Goals Patient Stated Goal: unable OT Goal Formulation: Patient unable to participate in goal setting Time For Goal Achievement: 01/31/23 Potential to Achieve Goals: Poor ADL Goals Pt Will Perform Grooming: with mod assist;sitting Pt Will Perform Upper Body Bathing: with min assist;bed level Pt Will Perform Lower Body Bathing: with max assist;bed level Pt Will Transfer to Toilet: with mod assist;with +2 assist;squat pivot transfer Pt Will Perform Toileting - Clothing Manipulation and hygiene: with max assist;bed level  Plan      Co-evaluation    PT/OT/SLP  Co-Evaluation/Treatment: Yes Reason for Co-Treatment: Necessary to address cognition/behavior during functional activity;For patient/therapist safety;To address functional/ADL transfers PT goals addressed during session: Mobility/safety with mobility;Balance;Strengthening/ROM OT goals addressed during session: ADL's and self-care;Strengthening/ROM      AM-PAC OT "6 Clicks" Daily Activity     Outcome Measure   Help from another person eating meals?: Total Help from another person taking care of personal grooming?: Total Help from another person toileting, which includes using toliet, bedpan, or urinal?: Total Help from another person bathing (including washing, rinsing, drying)?: Total Help from another person to put on and taking off regular upper body clothing?: Total Help from another person to put on and taking off regular lower body clothing?: Total 6 Click Score: 6    End of Session    OT Visit Diagnosis: Unsteadiness on feet (R26.81);Other symptoms and signs involving the nervous system (R29.898);Other symptoms and signs involving cognitive function   Activity Tolerance Patient tolerated treatment well   Patient Left in bed;with call bell/phone within reach;with bed alarm set;Other (comment);with family/visitor present (chair position)   Nurse Communication Mobility status;Other (comment) (peg tube leak)        Time: 0981-1914 OT  Time Calculation (min): 34 min  Charges: OT General Charges $OT Visit: 1 Visit OT Treatments $Self Care/Home Management : 8-22 mins  Nyoka Cowden OTR/L Acute Rehabilitation Services Office: [phone]  Evern Bio [facility] 01/17/2023, 12:47 PM

## 2023-01-18 DIAGNOSIS — I609 Nontraumatic subarachnoid hemorrhage, unspecified: Secondary | ICD-10-CM | POA: Diagnosis not present

## 2023-01-18 DIAGNOSIS — I480 Paroxysmal atrial fibrillation: Secondary | ICD-10-CM | POA: Diagnosis not present

## 2023-01-18 LAB — MAGNESIUM: Magnesium: 2.3 mg/dL (ref 1.7–2.4)

## 2023-01-18 LAB — BASIC METABOLIC PANEL
Anion gap: 10 (ref 5–15)
BUN: 30 mg/dL — ABNORMAL HIGH (ref 8–23)
CO2: 20 mmol/L — ABNORMAL LOW (ref 22–32)
Calcium: 8.9 mg/dL (ref 8.9–10.3)
Chloride: 104 mmol/L (ref 98–111)
Creatinine, Ser: 0.84 mg/dL (ref 0.61–1.24)
GFR, Estimated: >60 mL/min (ref >60)
Glucose, Bld: 120 mg/dL — ABNORMAL HIGH (ref 70–99)
Potassium: 5.4 mmol/L — ABNORMAL HIGH (ref 3.5–5.1)
Sodium: 134 mmol/L — ABNORMAL LOW (ref 135–145)

## 2023-01-18 LAB — GLUCOSE, CAPILLARY
Glucose-Capillary: 100 mg/dL — ABNORMAL HIGH (ref 70–99)
Glucose-Capillary: 115 mg/dL — ABNORMAL HIGH (ref 70–99)
Glucose-Capillary: 130 mg/dL — ABNORMAL HIGH (ref 70–99)
Glucose-Capillary: 125 mg/dL — ABNORMAL HIGH (ref 70–99)
Glucose-Capillary: 143 mg/dL — ABNORMAL HIGH (ref 70–99)
Glucose-Capillary: 134 mg/dL — ABNORMAL HIGH (ref 70–99)

## 2023-01-18 MED ORDER — OSMOLITE 1.5 CAL PO LIQD
1000.0000 mL | ORAL | Status: DC
  Administered 2023-01-18 – 2023-01-19 (×2): 1000 mL

## 2023-01-18 MED ORDER — AMIODARONE HCL 200 MG PO TABS
200.0000 mg | ORAL_TABLET | Freq: Two times a day (BID) | ORAL | Status: DC
  Administered 2023-01-18 – 2023-01-19 (×3): 200 mg
  Filled 2023-01-18 (×3): qty 1

## 2023-01-18 MED ORDER — FREE WATER
75.0000 mL | Freq: Four times a day (QID) | Status: DC
  Administered 2023-01-18 – 2023-01-19 (×4): 75 mL

## 2023-01-18 NOTE — Progress Notes (Signed)
Occupational Therapy Treatment Patient Details Name: James Deblasio Sr. MRN: [id number] DOB: [birth year] Today's Date: 01/18/2023   History of present illness 89 y.o. male who presented 12/15/22 s/p fall with head trauma. CTH shows diffuse scattered traumatic pattern subarachnoid hemorrhage. Cortrak placed 8/12. Peg 01/10/2023. PMHx includes COPD, prior NSTEMI / CHF, NSVT, PAF, anemia, bradycardia, senile osteoporosis, CAD, HLD, HTN   OT comments  Pt demonstrates core activation and static sitting balance at eob. Pt motivated with delayed initiation to the therapy Gomez. The patient needed extended time >5 minute to sit EOB and then engage with the Gomez. Recommendation for possible chair position for therapy Gomez session this session. Pt engaged in session for 20 minutes. Recommendation for skilled inpatient follow up therapy, <3 hours/day remains appropriate.       If plan is discharge home, recommend the following:  Two people to help with walking and/or transfers;Two people to help with bathing/dressing/bathroom   Equipment Recommendations  Wheelchair (measurements OT);Wheelchair cushion (measurements OT);Gomez bed;Hoyer lift    Recommendations for Other Services Speech consult    Precautions / Restrictions Precautions Precautions: Fall;Other (comment) Precaution Comments: PEG; abdominal binder watch HR Restrictions Weight Bearing Restrictions: No       Mobility Bed Mobility Overal bed mobility: Needs Assistance Bed Mobility: Supine to Sit, Sit to Supine     Supine to sit: Max assist, +2 for physical assistance, +2 for safety/equipment, HOB elevated Sit to supine: +2 for physical assistance, +2 for safety/equipment, Total assist, HOB elevated   General bed mobility comments: Pt minimally attempting to assist in bringing legs off L EOB when provided verbal and tactile cues. Max+2 to safely position LE's and raise trunk upright. Bed pad used to pivot buttocks to square hips with  EOB. Total assist x2 to return to supine.    Transfers                   General transfer comment: Did not attempt, focused on sitting balance and increasing pt's attention via use of Gomez during session today     Balance Overall balance assessment: Needs assistance Sitting-balance support: Feet supported, Single extremity supported, Bilateral upper extremity supported, No upper extremity supported Sitting balance-Leahy Scale: Fair Sitting balance - Comments: Pt initially leaning posteriorly, needing mod-maxA for balance. Once the pt began to attend to the therapy Gomez anterior to him he did begin to initiate core activation and lean anteriorly for more upright balance the bed was elevated which helped with alignment. pt progressing to CGA for safety. Pt able to sustain his balance, reaching intermittently off BOS to pet the Gomez, with contact guard assist for >20 min, needing UE support the more he fatigued and began to lean to the R. pt was able to initiate trunk and lean foward to try to put his face in the dogs face when the Gomez sat in the chair ( possibly trying to kiss the Gomez or have her kiss him) pt was able to recover from this anterior shift back to neutral trunk at eob without (A) Postural control: Posterior lean, Right lateral lean     Standing balance comment: deferred                           ADL either performed or assessed with clinical judgement   ADL Overall ADL's : Needs assistance/impaired Eating/Feeding: NPO Eating/Feeding Details (indicate cue type and reason): demonstrates pursed lip to whistel for the Gomez during session.  Therapist using this to facilitate this muscular activation. sharing informaiton with SLP team                                   General ADL Comments: session focused on initiation, visual attention, core activation, static sitting balance and dyanmic balance reaching for the Gomez    Extremity/Trunk Assessment Upper  Extremity Assessment Upper Extremity Assessment: RUE deficits/detail RUE Deficits / Details: holding hand in a fist the majority of session. when cued pt would open hand . pt demosntrates reaching for the Gomez a few times [name] Deficits / Details: pt undershooting when reaching for the Gomez. pt showing intact sensory input by immediately responding [name] feeling the tactile input of hair. pt also shows awareness by stating "im cold" in response to therapist cold hand   Lower Extremity Assessment Lower Extremity Assessment: Defer to PT evaluation        Vision   Vision Assessment?: Vision impaired- to be further tested in functional context Additional Comments: pt attending to the Gomez and staff in R and L visual fields. pt with a static central upward gaze between his sustain attention. pt needs name call to help capture his visual attention   Perception Perception Perception: Impaired Preception Impairment Details: Inattention/Neglect   Praxis Praxis Praxis: Impaired Praxis Impairment Details: Initiation    Cognition Arousal: Alert Behavior During Therapy: WFL for tasks assessed/performed Overall Cognitive Status: Impaired/Different from baseline Area of Impairment: Attention, Memory, Following commands, Safety/judgement, Awareness, Problem solving               Rancho Levels of Cognitive Functioning Ranch[place] Mirant Scales of Cognitive Functioning: Confused, Inappropriate Non-Agitated: Maximal Assistance   Current Attention Level: Sustained Memory: Decreased recall of precautions, Decreased short-term memory Following Commands: Follows one step commands inconsistently, Follows one step commands with increased time Safety/Judgement: Decreased awareness of safety, Decreased awareness of deficits Awareness: Intellectual Problem Solving: Slow processing, Decreased initiation, Difficulty sequencing, Requires verbal cues, Requires tactile cues General Comments: Coordinated  with Pet Therapy team to bring Gomez "[name]" to increase attention and participation from pt during PT/OT session. Pt calling her "[name]" Pt took >5 min before he began to process and actively attend to [name] more consistently. Pt would state "How about that [name]?" and tried to whistle to gain her attention at times. Pt also actively reached to pet [name] several times during session, primarily with his L UE (even though R hand dominant at baseline). Pt able to attend for brief periods of ~5-10 sec at a time. Follows simple cues inconsistently. pt answering questions about his Gomez giving her name [name]. he answered the question that his Gomez is black. pt gave the name to a cat they they have as well per daughter. The patient with increased visual attention when the Gomez was in the chair at eye level in the midline. Rancho Mirant Scales of Cognitive Functioning: Confused, Inappropriate Non-Agitated: Maximal Assistance [V]      Exercises Exercises: Other exercises Other Exercises Other Exercises: static and dynamic sitting EOB with intermittent UE support as he fatigued, cuing pt to reach off COG to pet Gomez, x >20 min    Shoulder Instructions       General Comments daughter present. Gomez used during session [name] with handler kim    Pertinent Vitals/ Pain       Pain Assessment Pain Assessment: Faces Faces Pain  Scale: No hurt Pain Intervention(s): Monitored during session, Repositioned  Home Living                                          Prior Functioning/Environment              Frequency  Min 1X/week        Progress Toward Goals  OT Goals(current goals can now be found in the care plan section)  Progress towards OT goals: Progressing toward goals  Acute Rehab OT Goals Patient Stated Goal: none stated OT Goal Formulation: Patient unable to participate in goal setting Time For Goal Achievement: 01/31/23 Potential to Achieve Goals: Fair ADL  Goals Pt Will Perform Grooming: with mod assist;sitting Pt Will Perform Upper Body Bathing: with min assist;bed level Pt Will Perform Lower Body Bathing: with max assist;bed level Pt Will Transfer to Toilet: with mod assist;with +2 assist;squat pivot transfer Pt Will Perform Toileting - Clothing Manipulation and hygiene: with max assist;bed level Additional ADL Goal #1: Pt will make eye contact with speaker without cues 75% of the time Additional ADL Goal #2: Pt will maintain unsupported seated balance at min guard level for approx 5 min in preparation for ADL participation  Plan      Co-evaluation    PT/OT/SLP Co-Evaluation/Treatment: Yes Reason for Co-Treatment: Necessary to address cognition/behavior during functional activity;For patient/therapist safety;To address functional/ADL transfers PT goals addressed during session: Mobility/safety with mobility;Balance;Strengthening/ROM OT goals addressed during session: ADL's and self-care;Strengthening/ROM      AM-PAC OT "6 Clicks" Daily Activity     Outcome Measure   Help from another person eating meals?: Total Help from another person taking care of personal grooming?: Total Help from another person toileting, which includes using toliet, bedpan, or urinal?: Total Help from another person bathing (including washing, rinsing, drying)?: Total Help from another person to put on and taking off regular upper body clothing?: Total Help from another person to put on and taking off regular lower body clothing?: Total 6 Click Score: 6    End of Session    OT Visit Diagnosis: Unsteadiness on feet (R26.81);Other symptoms and signs involving the nervous system (R29.898);Other symptoms and signs involving cognitive function   Activity Tolerance Patient tolerated treatment well   Patient Left in bed;with call bell/phone within reach;with bed alarm set;with family/visitor present   Nurse Communication Mobility status;Precautions         Time: 1610-9604 OT Time Calculation (min): 33 min  Charges: OT General Charges $OT Visit: 1 Visit OT Treatments $Self Care/Home Management : 8-22 mins   Brynn, OTR/L  Acute Rehabilitation Services Office: [phone] .   Mateo Flow 01/18/2023, 1:51 PM

## 2023-01-18 NOTE — Plan of Care (Signed)

## 2023-01-18 NOTE — Progress Notes (Signed)
Nutrition Follow-up  DOCUMENTATION CODES:   Severe malnutrition in context of chronic illness  INTERVENTION:   Continue tube feeds via G-tube. Adjust formula to concentrated product: - Osmolite 1.5 @ 55 ml/hr (1320 ml/day) - Free water flush, 75 ml every 6 hours  Tube feeding regimen provides 1980 kcal, 83 grams of protein, and 1006 ml of H2O.  Total free water with flushes: 1306 ml  NUTRITION DIAGNOSIS:   Severe Malnutrition related to chronic illness (COPD, HFrEF) as evidenced by severe fat depletion, severe muscle depletion.  Ongoing, being addressed via TF  GOAL:   Patient will meet greater than or equal to 90% of their needs  Met via TF  MONITOR:   Diet advancement, Labs, Weight trends, TF tolerance  REASON FOR ASSESSMENT:   Consult Assessment of nutrition requirement/status, Enteral/tube feeding initiation and management  ASSESSMENT:   89 year old male who presented to the ED on 8/08 after a fall with head trauma. PMH of COPD, prior NSTEMI, HFrEF, PAF, HTN, HLD. Pt admitted with SAH, acute metabolic encephalopathy.  08/09 - diet advanced to full liquids 08/11 - SLP evaluation, recommendation for full liquid diet 08/12 - SLP recommending NPO due to lethargy, Cortrak placed (tip gastric) 8/21 - MBS with recommendations for NPO 8/28 - MBS with recommendations for NPO 8/29 - Cortrak clogged 8/30 - Cortrak replaced (tip distal stomach) 9/03 - s/p 20 French G-tube placement  Pt working with therapy at the time of assessment.   Pt stable since last admission. Noted that has a bed offer, currently waiting on insurance authorization to discharge.   BUN rising over the last week. K high today and Na remains low. Will adjust to concentrated formula to provided additional kcal while continuing to meet pt's protein needs without the use of a modular as pt will be discharging soon. Also contains less K and more Na which may aid in managing altered electrolytes. Discussed  adjustment with RN.   Weight slightly up from last assessment but pt remains underweight.   Admit weight: 63.5 kg (appears to be stated/estimated) Current weight: 54.7 kg  Current TF orders: Osmolite 1.2 @ 65 ml/hr, PROSource TF20 60 ml daily, free water flushes 100 ml every 12 hours  Medications reviewed and include: colace, pepcid, oral abx, thiamine  Labs reviewed: sodium 134, K 5.4, BUN 30 CBG's: 90-128 x 24 hours  UOP: x 24 hours (? Accuracy) Net IO Since Admission: -20,084.8 mL [01/18/23 1501] (? Accuracy)  Diet Order:   Diet Order             Diet - low sodium heart healthy           Diet NPO time specified  Diet effective now                   EDUCATION NEEDS:   Education needs have been addressed (spoke with pt's daughter)  Skin:  Skin Assessment: Reviewed RN Assessment  Last BM:  9/11 - type 6 x 2  Height:   Ht Readings from Last 1 Encounters:  12/16/22 6' (1.829 m)    Weight:   Wt Readings from Last 1 Encounters:  01/16/23 54.7 kg    Ideal Body Weight:  80.9 kg  BMI:  Body mass index is 16.36 kg/m.  Estimated Nutritional Needs:   Kcal:  1750-1950  Protein:  80-95 grams  Fluid:  1.7-1.9 L    Greig Castilla, RD, LDN Clinical Dietitian RD pager # available in [facility]  After hours/weekend  pager # available in [facility]

## 2023-01-18 NOTE — Progress Notes (Signed)
Physical Therapy Treatment Patient Details Name: James Timbers Sr. MRN: [id number] DOB: [birth year] Today's Date: 01/18/2023   History of Present Illness Pt is an 89 y.o. male who presented 12/15/22 s/p fall with head trauma. CTH shows diffuse scattered traumatic pattern subarachnoid hemorrhage. Cortrak placed 8/12. Peg 01/10/2023. PMHx includes COPD, prior NSTEMI / CHF, NSVT, PAF, anemia, bradycardia, senile osteoporosis, CAD, HLD, HTN    PT Comments  Treated pt in conjunction with OT and with pet therapist to try to facilitate improved pt arousal, attention, command following, and sitting balance. Pt has a pet dog at home. Pt took several minutes sitting EOB with cues to attend to the dog before he began to process and comprehend there was a dog in the room. He began to attend and interact with it, calling its name and whistling for it. In addition, pt began to reach off COG while sitting EOB to pet the dog, facilitating and challenging core activation and his sitting balance. He tends to utilize his L hand to pet the dog more than his R, even though he is R hand dominant. In addition, he has poor aim, often undershooting, when trying to pet the dog. He can only sustain attention for brief periods of time before needing stimulation to attend to someone or the dog again. Will continue to follow acutely.   If plan is discharge home, recommend the following: Two people to help with walking and/or transfers;Two people to help with bathing/dressing/bathroom;Direct supervision/assist for financial management;Direct supervision/assist for medications management;Assistance with feeding;Help with stairs or ramp for entrance;Assist for transportation;Assistance with cooking/housework;Supervision due to cognitive status   Can travel by private vehicle     No  Equipment Recommendations   (defer to next venue)    Recommendations for Other Services       Precautions / Restrictions Precautions Precautions:  Fall;Other (comment) Precaution Comments: PEG; abdominal binder watch HR Restrictions Weight Bearing Restrictions: No     Mobility  Bed Mobility Overal bed mobility: Needs Assistance Bed Mobility: Supine to Sit, Sit to Supine     Supine to sit: Max assist, +2 for physical assistance, +2 for safety/equipment, HOB elevated Sit to supine: +2 for physical assistance, +2 for safety/equipment, Total assist, HOB elevated   General bed mobility comments: Pt minimally attempting to assist in bringing legs off L EOB when provided verbal and tactile cues. Max+2 to safely position LE's and raise trunk upright. Bed pad used to pivot buttocks to square hips with EOB. Total assist x2 to return to supine.    Transfers                   General transfer comment: Did not attempt, focused on sitting balance and increasing pt's attention via use of dog during session today    Ambulation/Gait               General Gait Details: unable at this time   Stairs             Wheelchair Mobility     Tilt Bed    Modified Rankin (Stroke Patients Only)       Balance Overall balance assessment: Needs assistance Sitting-balance support: Feet supported, Single extremity supported, Bilateral upper extremity supported, No upper extremity supported Sitting balance-Leahy Scale: Fair Sitting balance - Comments: Pt initially leaning posteriorly, needing mod-maxA for balance. Once the pt began to attend to the therapy dog anterior to him he did begin to initiate core activation and lean anteriorly for  more upright balance, progressing to CGA for safety. Pt able to sustain his balance, reaching intermittently off BOS to pet the dog, with contact guard assist for >20 min, needing UE support the more he fatigued and began to lean to the R. Postural control: Posterior lean, Right lateral lean     Standing balance comment: deferred                            Cognition Arousal:  Alert Behavior During Therapy: WFL for tasks assessed/performed Overall Cognitive Status: Impaired/Different from baseline Area of Impairment: Attention, Memory, Following commands, Safety/judgement, Awareness, Problem solving               Rancho Levels of Cognitive Functioning Rancho [place] Amigos Scales of Cognitive Functioning: Confused, Inappropriate Non-Agitated: Maximal Assistance   Current Attention Level: Focused Memory: Decreased recall of precautions, Decreased short-term memory Following Commands: Follows one step commands inconsistently, Follows one step commands with increased time Safety/Judgement: Decreased awareness of safety, Decreased awareness of deficits Awareness: Intellectual Problem Solving: Slow processing, Decreased initiation, Difficulty sequencing, Requires verbal cues, Requires tactile cues General Comments: Coordinated with Pet Therapy team to bring dog "Dixie" to increase attention and participation from pt during PT/OT session. Pt took >5 min before he began to process and actively attend to [facility] more consistently. Pt would state "How about that Dixie dog?" and tried to whistle to gain her attention at times. Pt also actively reached to pet Dixie several times during session, primarily with his L UE (even though R hand dominant at baseline). Pt able to attend for brief periods of ~5-10 sec at a time. Follows simple cues inconsistently   Rancho Mirant Scales of Cognitive Functioning: Confused, Inappropriate Non-Agitated: Maximal Assistance [V]    Exercises Other Exercises Other Exercises: static and dynamic sitting EOB with intermittent UE support as he fatigued, cuing pt to reach off COG to pet dog, x >20 min    General Comments General comments (skin integrity, edema, etc.): daughter present along with dog and owner from Pet Therapy services      Pertinent Vitals/Pain Pain Assessment Pain Assessment: Faces Faces Pain Scale: No hurt Pain  Intervention(s): Monitored during session    Home Living                          Prior Function            PT Goals (current goals can now be found in the care plan section) Acute Rehab PT Goals Patient Stated Goal: family hopes for pt to improve PT Goal Formulation: With patient/family Time For Goal Achievement: 01/20/23 Potential to Achieve Goals: Fair Progress towards PT goals: Progressing toward goals    Frequency    Min 1X/week      PT Plan      Co-evaluation PT/OT/SLP Co-Evaluation/Treatment: Yes Reason for Co-Treatment: Necessary to address cognition/behavior during functional activity;For patient/therapist safety;To address functional/ADL transfers PT goals addressed during session: Mobility/safety with mobility;Balance;Strengthening/ROM        AM-PAC PT "6 Clicks" Mobility   Outcome Measure  Help needed turning from your back to your side while in a flat bed without using bedrails?: A Lot Help needed moving from lying on your back to sitting on the side of a flat bed without using bedrails?: Total Help needed moving to and from a bed to a chair (including a wheelchair)?: Total Help needed standing up from  a chair using your arms (e.g., wheelchair or bedside chair)?: Total Help needed to walk in hospital room?: Total Help needed climbing 3-5 steps with a railing? : Total 6 Click Score: 7    End of Session   Activity Tolerance: Patient tolerated treatment well Patient left: in bed;with call bell/phone within reach;with bed alarm set;with family/visitor present Nurse Communication: Mobility status;Other (comment) (pt may be soiled) PT Visit Diagnosis: Other abnormalities of gait and mobility (R26.89);Muscle weakness (generalized) (M62.81);Other symptoms and signs involving the nervous system (R29.898);Pain;Unsteadiness on feet (R26.81);Difficulty in walking, not elsewhere classified (R26.2)     Time: 2536-6440 PT Time Calculation (min) (ACUTE  ONLY): 36 min  Charges:    $Neuromuscular Re-education: 8-22 mins PT General Charges $$ ACUTE PT VISIT: 1 Visit                     Virgil Benedict, PT, DPT Acute Rehabilitation Services  Office: [phone]    Bettina Gavia 01/18/2023, 12:55 PM

## 2023-01-18 NOTE — Progress Notes (Signed)
Speech Language Pathology Treatment: Dysphagia;Cognitive-Linquistic  Patient Details Name: James Scheumann Sr. MRN: [id number] DOB: 1931-08-12 Today's Date: 01/18/2023 Time: 0109-3235 SLP Time Calculation (min) (ACUTE ONLY): 31 min  Assessment / Plan / Recommendation Clinical Impression  Pt awake and alert, interacting verbally although with confusion with SLP and daughter. He frequently referenced therapy dogs that he saw during session with PT and OT, although with language of confusion as if the dogs were still present in the room. Pt 0% accurate with basic biographical yes/no questions today, unable to identify the name of his dog or daughter. SLP attempted to facilitate participation in functional activity of self-feeding without any meaningful participation. Attempted to facilitate initiation of hand movement during which pt became agitated. Observed pt with trials of ice chips x3 with pt unable to use compensatory strategies such as a cued throat clear or cough throughout all trials. Pt had one instance of a delayed cough following cessation of the trials. Provided education regarding role of SLP moving forward after d/c as well as potential for repeat instrumental swallow study at next venue of care to make further diet recommendations as clinically indicated. Recommend he remain NPO except for adherence with the Free Water Protocol only following thorough oral care QID performed by nursing staff. SLP will continue to f/u.   HPI HPI: Pt is an 89 y.o. male who presented with AMS after a fall. CT head: Scattered areas of subarachnoid hemorrhage in the left occipital  lobe, bilateral high frontal lobes, and right temporal lobe. Small amount of subarachnoid hemorrhage posterior to the right midbrain extending into the cerebral aqueduct. CTH 8/26 with decreased size and density of previously seen multifocal SAH. PMH: COPD, prior NSTEMI / CHF, NSVT, PAF, on ASA81.      SLP Plan  Continue with  current plan of care      Recommendations for follow up therapy are one component of a multi-disciplinary discharge planning process, led by the attending physician.  Recommendations may be updated based on patient status, additional functional criteria and insurance authorization.    Recommendations  Diet recommendations: NPO Medication Administration: Via alternative means                  Oral care QID;Staff/trained caregiver to provide oral care;Oral care prior to ice chip/H20   Frequent or constant Supervision/Assistance Dysphagia, oropharyngeal phase (R13.12);Cognitive communication deficit (T73.220)     Continue with current plan of care     Gwynneth Aliment, M.A., CF-SLP Speech Language Pathology, Acute Rehabilitation Services  Secure Chat preferred [phone]   01/18/2023, 3:24 PM

## 2023-01-18 NOTE — Progress Notes (Signed)
   01/18/23 1948  Assess: MEWS Score  Temp 98.3 F (36.8 C)  BP 132/73  MAP (mmHg) 85  ECG Heart Rate (!) 108  Resp (!) 21  SpO2 97 %  O2 Device Room Air  Assess: MEWS Score  MEWS Temp 0  MEWS Systolic 0  MEWS Pulse 1  MEWS RR 1  MEWS LOC 0  MEWS Score 2  MEWS Score Color Yellow  Assess: if the MEWS score is Yellow or Red  Were vital signs accurate and taken at a resting state? Yes  Does the patient meet 2 or more of the SIRS criteria? No  Does the patient have a confirmed or suspected source of infection? No  MEWS guidelines implemented  Yes, yellow  Treat  MEWS Interventions Considered administering scheduled or prn medications/treatments as ordered  Take Vital Signs  Increase Vital Sign Frequency  Yellow: Q2hr x1, continue Q4hrs until patient remains green for 12hrs  Escalate  MEWS: Escalate Yellow: Discuss with charge nurse and consider notifying provider and/or RRT  Notify: Charge Nurse/RN  Name of Charge Nurse/RN Notified Mary, RN  Assess: SIRS CRITERIA  SIRS Temperature  0  SIRS Pulse 1  SIRS Respirations  1  SIRS WBC 0  SIRS Score Sum  2

## 2023-01-18 NOTE — Progress Notes (Signed)
[facility] PROGRESS NOTE   James Opfer Sr. ZOX:[id number] DOB: [birth year] DOA: 12/15/2022  PCP: Blane Ohara, MD  Brief History/Interval Summary: 89 year old with past medical history significant for A-fib, known SVT, ischemic cardiomyopathy (non-STEMI status post LAD stent 2022) heart failure reduced ejection fraction (left ventricular ejection fraction improved to 55 on 2022 from 35%), hypertension, COPD, hypothyroidism, chronic anemia, frequent falls presents AT/8 after a fall found to have a traumatic subarachnoid hemorrhage.   Hospitalization has been complicated by E. coli UTI, asymptomatic runs of A-fib and PVC for which cardiology was consulted, acute urinary retention for which urology was consulted, placed urinary catheter 8/12, dysphagia and persistent encephalopathy.   Subjective/Interval History: Patient remains unresponsive    Assessment/Plan:  Traumatic subarachnoid hemorrhage/subdural hematoma: -CT 8/13 mentioned vasogenic edema and expected redistribution of contusions.  Repeat CT scan 8/26 shows improvement in size of SDH. Still encephalopathic.  Had peg tube placed 9/03. Tolerating tube feeding.  Follow up with Neurosurgery in regard resumption of aspirin. Due to [facility] and falls.    Recurrent UTI:  Fever, persistent leukocytosis Recurrent fevers and UTI during this hospitalization Initially completed course of ceftriaxone for E. coli UTI. Recurrent fever and repeat urine culture now showed Enterobacter and E. coli.  Patient was placed back on ceftriaxone on 9/3.  Changed over to Bactrim on 9/8.  A 10-day course is being pursued.   Foley catheter exchange 9/05. WBC normalized.    TBI with acute encephalopathy Alert, aphasic. No significant improvement during this admission.  B 12 500, normal.  Thiamine level pending.  Started thiamine.    Severe protein caloric malnutrition/dysphagia Status post PEG tube placement.  Continue with tube  feedings.   Acute urinary retention, BPH, prostate cancer status post brachytherapy Urology was consulted.  He will need catheter until he is more mobile. Will likely need to go to SNF with catheter Needs to follow up with urology for Voiding trial Foley catheter exchange 9/05/. He will need foley exchange every 30 days.    PAF w/RVR, frequent PVCs, hx NSVT Heart rate is better.  Continue metoprolol.   Holding full dose anticoagulation due to fall risk and current SAH, SDH. Not a good candidate for Watchman device. Experiencing episodes of ventricular bigeminy as well as occasional nonsustained ventricular tachycardia.  Recent echocardiogram showed normal systolic function.  Check magnesium level.  Consider increasing the dose of beta-blocker.  Consider adding amiodarone.   CAD s/p LAD stent 2022 Elevated troponin is mild at 32, flat, without angina consistent with demand myocardial ischemia.  Continue beta blocker, statin. Not on antiplatelet due to bleeding.    Chronic HFrEF with improved EF  35>55%, 2022.  Echocardiogram from August 2024 showed EF to be 50 to 55%. Continue metoprolol.   Remains euvolemic.   Hypokalemia Resolved.   Hyponatremia:  Stable.  Continue to monitor.   Hypophosphatemia:  Resolved    Chronic normocytic anemia:      Moderate persistent asthma, uncomplicated No exacerbation. - Continue DuoNebs.   Acquired hypothyroidism TSH is 6.177, free T4 is normal.  - Continue levothyroxine 175 mcg daily   Essential hypertension Continue with metoprolol/    Debility, deconditioning, frailty -needs rehab.    Constipation -bowel regimen PRN   DVT Prophylaxis: Subcutaneous heparin Code Status: Full code Family Communication: No family at bedside Disposition Plan: SNF    Medications: Scheduled:  Chlorhexidine Gluconate Cloth  6 each Topical Daily   docusate  100 mg Per Tube BID   famotidine  40 mg Per Tube Daily   feeding supplement (PROSource  TF20)  60 mL Per Tube Daily   free water  100 mL Per Tube Q12H   heparin injection (subcutaneous)  5,000 Units Subcutaneous Q12H   levothyroxine  175 mcg Per Tube Q0600   loratadine  10 mg Per Tube Daily   metoprolol tartrate  25 mg Per Tube BID   mouth rinse  15 mL Mouth Rinse 4 times per day   sulfamethoxazole-trimethoprim  1 tablet Per Tube Q12H   thiamine  100 mg Per Tube Daily   Continuous:  sodium chloride Stopped (01/15/23 1546)   feeding supplement (OSMOLITE 1.2 CAL) 1,000 mL (01/18/23 0333)   YQI:HKVQQV chloride, acetaminophen **OR** acetaminophen, albuterol, bisacodyl, fentaNYL, metoprolol tartrate, midazolam, ondansetron **OR** ondansetron (ZOFRAN) IV, mouth rinse, polyethylene glycol, senna, white petrolatum  Antibiotics: Anti-infectives (From admission, onward)    Start     Dose/Rate Route Frequency Ordered Stop   01/17/23 0000  sulfamethoxazole-trimethoprim (BACTRIM DS) 800-160 MG tablet        1 tablet Per Tube Every 12 hours 01/17/23 1155 01/20/23 2359   01/15/23 1515  sulfamethoxazole-trimethoprim (BACTRIM DS) 800-160 MG per tablet 1 tablet        1 tablet Per Tube Every 12 hours 01/15/23 1429     01/15/23 1415  ciprofloxacin (CIPRO) IVPB 400 mg  Status:  Discontinued        400 mg 200 mL/hr over 60 Minutes Intravenous Every 12 hours 01/15/23 1324 01/15/23 1429   01/13/23 0000  sulfamethoxazole-trimethoprim (BACTRIM DS) 800-160 MG tablet  Status:  Discontinued        1 tablet Per Tube Every 12 hours 01/13/23 1024 01/17/23    01/11/23 1045  sulfamethoxazole-trimethoprim (BACTRIM DS) 800-160 MG per tablet 1 tablet  Status:  Discontinued        1 tablet Per Tube Every 12 hours 01/11/23 0958 01/15/23 1323   01/11/23 0600  ceFAZolin (ANCEF) IVPB 2g/100 mL premix       Note to Pharmacy: Give in IR for surgical prophylaxis   2 g 200 mL/hr over 30 Minutes Intravenous On call to O.R. 01/10/23 1215 01/12/23 0559   01/09/23 1400  cefTRIAXone (ROCEPHIN) 1 g in sodium chloride  0.9 % 100 mL IVPB  Status:  Discontinued        1 g 200 mL/hr over 30 Minutes Intravenous Every 24 hours 01/09/23 1312 01/11/23 0958   12/20/22 1130  terbinafine (LAMISIL) tablet 250 mg  Status:  Discontinued        250 mg Per Tube Daily 12/20/22 1039 01/15/23 1527   12/19/22 1415  cefTRIAXone (ROCEPHIN) 1 g in sodium chloride 0.9 % 100 mL IVPB  Status:  Discontinued        1 g 200 mL/hr over 30 Minutes Intravenous Every 24 hours 12/19/22 1324 12/26/22 1358       Objective:  Vital Signs  Vitals:   01/17/23 1915 01/17/23 2325 01/18/23 0351 01/18/23 0807  BP: 114/74 116/72 131/81 (!) 117/90  Pulse: 97 77 90 87  Resp:  20 20 20   Temp: 98.2 F (36.8 C) 98.5 F (36.9 C) 98.6 F (37 C) 98.4 F (36.9 C)  TempSrc: Oral Axillary Axillary Axillary  SpO2: 96% 98% 97% 98%  Weight:      Height:        Intake/Output Summary (Last 24 hours) at 01/18/2023 1046 Last data filed at 01/18/2023 0411 Gross per 24 hour  Intake --  Output 950 ml  Net -950 ml   Filed Weights   01/14/23 0713 01/15/23 0700 01/16/23 0305  Weight: 59.1 kg 57.9 kg 54.7 kg    General appearance: Remains unresponsive Resp: Clear to auscultation bilaterally.  Normal effort Cardio: S1-S2 is normal regular.  No S3-S4.  No rubs murmurs or bruit GI: Abdomen is soft.  Nontender nondistended.  Bowel sounds are present normal.  No masses organomegaly.  PEG tube is noted   Lab Results:  Data Reviewed: I have personally reviewed following labs and reports of the imaging studies  CBC: Recent Labs  Lab 01/12/23 1138 01/14/23 0820  WBC 11.1* 10.5  HGB 10.9* 10.9*  HCT 33.5* 34.4*  MCV 97.4 95.0  PLT 310 301    Basic Metabolic Panel: Recent Labs  Lab 01/12/23 1138 01/14/23 0820 01/18/23 0925  NA 134* 131* 134*  K 4.9 4.6 5.4*  CL 100 102 104  CO2 24 23 20*  GLUCOSE 117* 109* 120*  BUN 23 26* 30*  CREATININE 0.65 0.73 0.84  CALCIUM 8.4* 8.1* 8.9    GFR: Estimated Creatinine Clearance: 46.1 mL/min  (by C-G formula based on SCr of 0.84 mg/dL).    CBG: Recent Labs  Lab 01/17/23 1544 01/17/23 1930 01/17/23 2327 01/18/23 0352 01/18/23 0804  GLUCAP 113* 127* 116* 100* 115*    Recent Results (from the past 240 hour(s))  Culture, Urine (Do not remove urinary catheter, catheter placed by urology or difficult to place)     Status: Abnormal   Collection Time: 01/09/23 11:15 AM   Specimen: Urine, Catheterized  Result Value Ref Range Status   Specimen Description URINE, CATHETERIZED  Final   Special Requests   Final    NONE Performed at [facility], [address], [place], [place] [address]    Culture (A)  Final    >=100,000 COLONIES/mL ESCHERICHIA COLI >=100,000 COLONIES/mL ENTEROBACTER CLOACAE    Report Status 01/11/2023 FINAL  Final   Organism ID, Bacteria ESCHERICHIA COLI (A)  Final   Organism ID, Bacteria ENTEROBACTER CLOACAE (A)  Final      Susceptibility   Enterobacter cloacae - MIC*    CEFEPIME 2 SENSITIVE Sensitive     CIPROFLOXACIN <=0.25 SENSITIVE Sensitive     GENTAMICIN <=1 SENSITIVE Sensitive     IMIPENEM <=0.25 SENSITIVE Sensitive     NITROFURANTOIN 64 INTERMEDIATE Intermediate     TRIMETH/SULFA <=20 SENSITIVE Sensitive     PIP/TAZO >=128 RESISTANT Resistant     * >=100,000 COLONIES/mL ENTEROBACTER CLOACAE   Escherichia coli - MIC*    AMPICILLIN >=32 RESISTANT Resistant     CEFAZOLIN <=4 SENSITIVE Sensitive     CEFEPIME <=0.12 SENSITIVE Sensitive     CEFTRIAXONE <=0.25 SENSITIVE Sensitive     CIPROFLOXACIN <=0.25 SENSITIVE Sensitive     GENTAMICIN <=1 SENSITIVE Sensitive     IMIPENEM <=0.25 SENSITIVE Sensitive     NITROFURANTOIN <=16 SENSITIVE Sensitive     TRIMETH/SULFA <=20 SENSITIVE Sensitive     AMPICILLIN/SULBACTAM 16 INTERMEDIATE Intermediate     PIP/TAZO <=4 SENSITIVE Sensitive     * >=100,000 COLONIES/mL ESCHERICHIA COLI  Urine Culture (for pregnant, neutropenic or urologic patients or patients with an indwelling urinary catheter)      Status: Abnormal   Collection Time: 01/12/23 12:06 PM   Specimen: Urine, Catheterized  Result Value Ref Range Status   Specimen Description URINE, CATHETERIZED  Final   Special Requests NONE  Final   Culture (A)  Final    <10,000 COLONIES/mL INSIGNIFICANT GROWTH Performed  at [facility], [address] [place], [place] [address]    Report Status 01/13/2023 FINAL  Final      Radiology Studies: No results found.     LOS: 33 days   [employer] on [email or web address]  01/18/2023, 10:46 AM

## 2023-01-19 DIAGNOSIS — Z931 Gastrostomy status: Secondary | ICD-10-CM | POA: Diagnosis not present

## 2023-01-19 DIAGNOSIS — J4540 Moderate persistent asthma, uncomplicated: Secondary | ICD-10-CM | POA: Diagnosis not present

## 2023-01-19 DIAGNOSIS — R531 Weakness: Secondary | ICD-10-CM | POA: Diagnosis not present

## 2023-01-19 DIAGNOSIS — M25532 Pain in left wrist: Secondary | ICD-10-CM | POA: Diagnosis not present

## 2023-01-19 DIAGNOSIS — I2510 Atherosclerotic heart disease of native coronary artery without angina pectoris: Secondary | ICD-10-CM | POA: Diagnosis not present

## 2023-01-19 DIAGNOSIS — R6510 Systemic inflammatory response syndrome (SIRS) of non-infectious origin without acute organ dysfunction: Secondary | ICD-10-CM | POA: Diagnosis not present

## 2023-01-19 DIAGNOSIS — E46 Unspecified protein-calorie malnutrition: Secondary | ICD-10-CM | POA: Diagnosis not present

## 2023-01-19 DIAGNOSIS — G936 Cerebral edema: Secondary | ICD-10-CM | POA: Diagnosis not present

## 2023-01-19 DIAGNOSIS — I110 Hypertensive heart disease with heart failure: Secondary | ICD-10-CM | POA: Diagnosis not present

## 2023-01-19 DIAGNOSIS — I480 Paroxysmal atrial fibrillation: Secondary | ICD-10-CM | POA: Diagnosis not present

## 2023-01-19 DIAGNOSIS — N139 Obstructive and reflux uropathy, unspecified: Secondary | ICD-10-CM | POA: Diagnosis not present

## 2023-01-19 DIAGNOSIS — E43 Unspecified severe protein-calorie malnutrition: Secondary | ICD-10-CM | POA: Diagnosis not present

## 2023-01-19 DIAGNOSIS — Y92019 Unspecified place in single-family (private) house as the place of occurrence of the external cause: Secondary | ICD-10-CM | POA: Diagnosis not present

## 2023-01-19 DIAGNOSIS — B9689 Other specified bacterial agents as the cause of diseases classified elsewhere: Secondary | ICD-10-CM | POA: Diagnosis not present

## 2023-01-19 DIAGNOSIS — I609 Nontraumatic subarachnoid hemorrhage, unspecified: Secondary | ICD-10-CM | POA: Diagnosis not present

## 2023-01-19 DIAGNOSIS — D649 Anemia, unspecified: Secondary | ICD-10-CM | POA: Diagnosis not present

## 2023-01-19 DIAGNOSIS — W19XXXD Unspecified fall, subsequent encounter: Secondary | ICD-10-CM | POA: Diagnosis not present

## 2023-01-19 DIAGNOSIS — E039 Hypothyroidism, unspecified: Secondary | ICD-10-CM | POA: Diagnosis not present

## 2023-01-19 DIAGNOSIS — J4489 Other specified chronic obstructive pulmonary disease: Secondary | ICD-10-CM | POA: Diagnosis not present

## 2023-01-19 DIAGNOSIS — Z7401 Bed confinement status: Secondary | ICD-10-CM | POA: Diagnosis not present

## 2023-01-19 DIAGNOSIS — Z23 Encounter for immunization: Secondary | ICD-10-CM | POA: Diagnosis present

## 2023-01-19 DIAGNOSIS — B9620 Unspecified Escherichia coli [E. coli] as the cause of diseases classified elsewhere: Secondary | ICD-10-CM | POA: Diagnosis not present

## 2023-01-19 DIAGNOSIS — N390 Urinary tract infection, site not specified: Secondary | ICD-10-CM | POA: Diagnosis not present

## 2023-01-19 DIAGNOSIS — H1010 Acute atopic conjunctivitis, unspecified eye: Secondary | ICD-10-CM | POA: Diagnosis not present

## 2023-01-19 DIAGNOSIS — I255 Ischemic cardiomyopathy: Secondary | ICD-10-CM | POA: Diagnosis not present

## 2023-01-19 DIAGNOSIS — W1830XA Fall on same level, unspecified, initial encounter: Secondary | ICD-10-CM | POA: Diagnosis not present

## 2023-01-19 DIAGNOSIS — R1310 Dysphagia, unspecified: Secondary | ICD-10-CM | POA: Diagnosis not present

## 2023-01-19 DIAGNOSIS — I5022 Chronic systolic (congestive) heart failure: Secondary | ICD-10-CM | POA: Diagnosis not present

## 2023-01-19 DIAGNOSIS — S066X0D Traumatic subarachnoid hemorrhage without loss of consciousness, subsequent encounter: Secondary | ICD-10-CM | POA: Diagnosis not present

## 2023-01-19 DIAGNOSIS — Z431 Encounter for attention to gastrostomy: Secondary | ICD-10-CM | POA: Diagnosis not present

## 2023-01-19 DIAGNOSIS — R339 Retention of urine, unspecified: Secondary | ICD-10-CM | POA: Diagnosis not present

## 2023-01-19 LAB — CBC
HCT: 37.4 % — ABNORMAL LOW (ref 39.0–52.0)
Hemoglobin: 11.9 g/dL — ABNORMAL LOW (ref 13.0–17.0)
MCH: 31.2 pg (ref 26.0–34.0)
MCHC: 31.8 g/dL (ref 30.0–36.0)
MCV: 97.9 fL (ref 80.0–100.0)
Platelets: 281 10*3/uL (ref 150–400)
RBC: 3.82 MIL/uL — ABNORMAL LOW (ref 4.22–5.81)
RDW: 13.4 % (ref 11.5–15.5)
WBC: 10.6 10*3/uL — ABNORMAL HIGH (ref 4.0–10.5)
nRBC: 0 % (ref 0.0–0.2)

## 2023-01-19 LAB — BASIC METABOLIC PANEL
Anion gap: 10 (ref 5–15)
BUN: 32 mg/dL — ABNORMAL HIGH (ref 8–23)
CO2: 23 mmol/L (ref 22–32)
Calcium: 8.9 mg/dL (ref 8.9–10.3)
Chloride: 104 mmol/L (ref 98–111)
Creatinine, Ser: 0.93 mg/dL (ref 0.61–1.24)
GFR, Estimated: >60 mL/min (ref >60)
Glucose, Bld: 131 mg/dL — ABNORMAL HIGH (ref 70–99)
Potassium: 4.6 mmol/L (ref 3.5–5.1)
Sodium: 137 mmol/L (ref 135–145)

## 2023-01-19 LAB — GLUCOSE, CAPILLARY
Glucose-Capillary: 142 mg/dL — ABNORMAL HIGH (ref 70–99)
Glucose-Capillary: 159 mg/dL — ABNORMAL HIGH (ref 70–99)
Glucose-Capillary: 144 mg/dL — ABNORMAL HIGH (ref 70–99)

## 2023-01-19 LAB — MAGNESIUM: Magnesium: 2.3 mg/dL (ref 1.7–2.4)

## 2023-01-19 MED ORDER — AMIODARONE HCL 200 MG PO TABS: ORAL_TABLET | ORAL | Status: DC

## 2023-01-19 MED ORDER — FREE WATER: 75.0000 mL | Freq: Four times a day (QID) | Status: DC

## 2023-01-19 MED ORDER — OSMOLITE 1.5 CAL PO LIQD: 1000.0000 mL | ORAL | Status: DC

## 2023-01-19 NOTE — TOC Transition Note (Signed)
Transition of Care [facility]) - CM/SW Discharge Note   Patient Details  Name: James Arseneau Sr. MRN: [id number] Date of Birth: 12/15/1931  [facility] of [facility]) CM/SW Contact:  Eduard Roux, LCSW Phone Number: 01/19/2023, 12:17 PM   Clinical Narrative:     Patient will Discharge to: [facility] Discharge Date:01/19/2023 Family Notified: daughter Transport WU:[id number]  Per MD patient is ready for discharge. RN, patient, and facility notified of discharge. Discharge Summary sent to facility. RN given number for [phone]. Ambulance transport requested for patient.   Clinical Social Worker signing off.  Antony Blackbird, MSW, LCSW Clinical Social Worker     Final next level of care: Skilled Nursing Facility Barriers to Discharge: Barriers Resolved   Patient Goals and CMS Choice      Discharge Placement                Patient chooses bed at: [facility] Patient to be transferred to facility by: [facility] Name of family member notified: daughter Patient and family notified of of transfer: 01/19/23  Discharge Plan and Services Additional resources added to the After Visit Summary for   In-house Referral: Clinical Social Work                                   Social Determinants of Health (SDOH) Interventions SDOH Screenings   Food Insecurity: No Food Insecurity (12/16/2022)  Housing: Low Risk  (12/16/2022)  Transportation Needs: No Transportation Needs (12/16/2022)  Utilities: Not At Risk (12/16/2022)  Alcohol Screen: Low Risk  (07/08/2022)  Depression (PHQ2-9): Low Risk  (07/26/2022)  Financial Resource Strain: Low Risk  (03/30/2022)  Physical Activity: Insufficiently Active (01/29/2021)  Social Connections: Socially Integrated (01/29/2021)  Stress: No Stress Concern Present (01/29/2021)  Tobacco Use: Medium Risk (01/05/2023)     Readmission Risk Interventions     No data to display

## 2023-01-19 NOTE — Discharge Summary (Signed)
[facility]  Physician Discharge Summary   Patient ID: James Quant Sr. MRN: [id number] DOB/AGE: July 19, 1931 89 y.o.  Admit date: 12/15/2022 Discharge date:   01/19/2023   PCP: Blane Ohara, MD  DISCHARGE DIAGNOSES:    Subarachnoid hemorrhage [facility])   Frequent falls   Acute metabolic encephalopathy   Frequent PVCs   PAF (paroxysmal atrial fibrillation) (HCC)   Chronic HFrEF with improved EF(heart failure with reduced ejection fraction) (HCC)   Ischemic cardiomyopathy   SIRS (systemic inflammatory response syndrome) (HCC)   CAD S/P LAD stent 2022   History of recent fall   Essential hypertension   Acquired hypothyroidism   Mixed conductive and sensorineural hearing loss, bilateral   Moderate persistent asthma, uncomplicated   Anemia   Hypokalemia   Protein-calorie malnutrition, severe   Urinary retention   RECOMMENDATIONS FOR OUTPATIENT FOLLOW UP: Needs to follow up with Dr Ave Filter, neurosurgery in 1 week Needs to follow up with Urology for voiding trial Please check TSH and free T4 in 3 weeks and check periodically while on amiodarone    Home Health: Going to SNF Equipment/Devices: None  CODE STATUS: Full code  DISCHARGE CONDITION: fair  Diet recommendation: Tube feedings  INITIAL HISTORY:  89 year old with past medical history significant for A-fib, known SVT, ischemic cardiomyopathy (non-STEMI status post LAD stent 2022) heart failure reduced ejection fraction (left ventricular ejection fraction improved to 55 on 2022 from 35%), hypertension, COPD, hypothyroidism, chronic anemia, frequent falls presents AT/8 after a fall found to have a traumatic subarachnoid hemorrhage.   Hospitalization has been complicated by E. coli UTI, asymptomatic runs of A-fib and PVC for which cardiology was consulted, acute urinary retention for which urology was consulted, placed urinary catheter 8/12, dysphagia and persistent encephalopathy.    HOSPITAL COURSE:    Traumatic subarachnoid hemorrhage/subdural hematoma: -CT 8/13 mentioned vasogenic edema and expected redistribution of contusions.  Repeat CT scan 8/26 shows improvement in size of SDH. Still encephalopathic.  Had peg tube placed 9/03. Tolerating tube feeding.  Follow up with Neurosurgery in regard resumption of aspirin. Due to [facility] and falls.    Recurrent UTI:  Fever, persistent leukocytosis Recurrent fevers and UTI during this hospitalization Initially completed course of ceftriaxone for E. coli UTI. Recurrent fever and repeat urine culture now showed Enterobacter and E. coli.  Patient was placed back on ceftriaxone on 9/3.  Changed over to Bactrim on 9/8.  A 10-day course is being pursued.   Foley catheter exchange 9/05.   TBI with acute encephalopathy Alert, aphasic. No significant improvement during this admission.  B 12 500, normal.  Thiamine level is normal at 155.7.    Severe protein caloric malnutrition/dysphagia Status post PEG tube placement.  Continue with tube feedings.   Acute urinary retention, BPH, prostate cancer status post brachytherapy Urology was consulted.  He will need catheter until he is more mobile. Will likely need to go to SNF with catheter Needs to follow up with urology for Voiding trial Foley catheter exchange 9/05. He will need foley exchange every 30 days.    PAF w/RVR, frequent PVCs, hx NSVT Heart rate is better.  Continue metoprolol.   Holding full dose anticoagulation due to fall risk and current SAH, SDH. Not a good candidate for Watchman device. Experiencing episodes of ventricular bigeminy as well as occasional nonsustained ventricular tachycardia.  Recent echocardiogram showed normal systolic function.  Electrolyte levels were reasonable.  Started on amiodarone.  Improvement in PVC burden noted.  Continue with metoprolol as well.  Will need to check thyroid function test periodically while on amiodarone.     CAD s/p LAD stent 2022 Elevated  troponin is mild at 32, flat, without angina consistent with demand myocardial ischemia.  Continue beta blocker, statin. Not on antiplatelets due to bleeding.    Chronic HFrEF with improved EF  35>55%, 2022.  Echocardiogram from August 2024 showed EF to be 50 to 55%. Continue metoprolol.   Remains euvolemic.   Hypokalemia Resolved.   Hyponatremia:  Stable.  Continue to monitor.   Hypophosphatemia:  Resolved    Chronic normocytic anemia:      Moderate persistent asthma, uncomplicated No exacerbation. - Continue DuoNebs.   Acquired hypothyroidism TSH is 6.177, free T4 is normal.  - Continue levothyroxine 175 mcg daily Check thyroid function test every few weeks while on amiodarone.   Essential hypertension Continue with metoprolol.   Debility, deconditioning, frailty -needs rehab.    Constipation -bowel regimen PRN  Patient is stable.  Okay for discharge to SNF when bed is available.  PERTINENT LABS:  The results of significant diagnostics from this hospitalization (including imaging, microbiology, ancillary and laboratory) are listed below for reference.    Microbiology: Recent Results (from the past 240 hour(s))  Culture, Urine (Do not remove urinary catheter, catheter placed by urology or difficult to place)     Status: Abnormal   Collection Time: 01/09/23 11:15 AM   Specimen: Urine, Catheterized  Result Value Ref Range Status   Specimen Description URINE, CATHETERIZED  Final   Special Requests   Final    NONE Performed at [facility], [address], [place], [place] [address]    Culture (A)  Final    >=100,000 COLONIES/mL ESCHERICHIA COLI >=100,000 COLONIES/mL ENTEROBACTER CLOACAE    Report Status 01/11/2023 FINAL  Final   Organism ID, Bacteria ESCHERICHIA COLI (A)  Final   Organism ID, Bacteria ENTEROBACTER CLOACAE (A)  Final      Susceptibility   Enterobacter cloacae - MIC*    CEFEPIME 2 SENSITIVE Sensitive     CIPROFLOXACIN <=0.25  SENSITIVE Sensitive     GENTAMICIN <=1 SENSITIVE Sensitive     IMIPENEM <=0.25 SENSITIVE Sensitive     NITROFURANTOIN 64 INTERMEDIATE Intermediate     TRIMETH/SULFA <=20 SENSITIVE Sensitive     PIP/TAZO >=128 RESISTANT Resistant     * >=100,000 COLONIES/mL ENTEROBACTER CLOACAE   Escherichia coli - MIC*    AMPICILLIN >=32 RESISTANT Resistant     CEFAZOLIN <=4 SENSITIVE Sensitive     CEFEPIME <=0.12 SENSITIVE Sensitive     CEFTRIAXONE <=0.25 SENSITIVE Sensitive     CIPROFLOXACIN <=0.25 SENSITIVE Sensitive     GENTAMICIN <=1 SENSITIVE Sensitive     IMIPENEM <=0.25 SENSITIVE Sensitive     NITROFURANTOIN <=16 SENSITIVE Sensitive     TRIMETH/SULFA <=20 SENSITIVE Sensitive     AMPICILLIN/SULBACTAM 16 INTERMEDIATE Intermediate     PIP/TAZO <=4 SENSITIVE Sensitive     * >=100,000 COLONIES/mL ESCHERICHIA COLI  Urine Culture (for pregnant, neutropenic or urologic patients or patients with an indwelling urinary catheter)     Status: Abnormal   Collection Time: 01/12/23 12:06 PM   Specimen: Urine, Catheterized  Result Value Ref Range Status   Specimen Description URINE, CATHETERIZED  Final   Special Requests NONE  Final   Culture (A)  Final    <10,000 COLONIES/mL INSIGNIFICANT GROWTH Performed at [facility], [address], [place], [place] [address]    Report Status 01/13/2023 FINAL  Final  Labs:   Basic Metabolic Panel: Recent Labs  Lab 01/12/23 1138 01/14/23 0820 01/18/23 0925 01/18/23 0941 01/19/23 0708  NA 134* 131* 134*  --  137  K 4.9 4.6 5.4*  --  4.6  CL 100 102 104  --  104  CO2 24 23 20*  --  23  GLUCOSE 117* 109* 120*  --  131*  BUN 23 26* 30*  --  32*  CREATININE 0.65 0.73 0.84  --  0.93  CALCIUM 8.4* 8.1* 8.9  --  8.9  MG  --   --   --  2.3 2.3    CBC: Recent Labs  Lab 01/12/23 1138 01/14/23 0820 01/19/23 0708  WBC 11.1* 10.5 10.6*  HGB 10.9* 10.9* 11.9*  HCT 33.5* 34.4* 37.4*  MCV 97.4 95.0 97.9  PLT 310 301 281    CBG: Recent Labs   Lab 01/18/23 1602 01/18/23 1945 01/18/23 2320 01/19/23 0334 01/19/23 0733  GLUCAP 125* 143* 134* 142* 159*     IMAGING STUDIES DG Abd 1 View  Result Date: 01/15/2023 CLINICAL DATA:  Complaint associated with gastric tube EXAM: ABDOMEN - 1 VIEW COMPARISON:  Percutaneous gastrostomy tube placement images [id number]; abdominal radiographs 01/06/2023 FINDINGS: Balloon retention percutaneous gastrostomy tube projects over the expected location. In the absence of intravenous contrast, intraluminal placement cannot be confirmed. No evidence of bowel obstruction. Previously ingested oral contrast material present throughout the colon. Brachytherapy seeds overlying the prostate gland. A catheter overlies the pelvis, presumably within the bladder. No acute osseous abnormality. IMPRESSION: 1. Balloon retention gastrostomy tube projects over the expected location. If confirmation of intragastric location is desired, recommend injecting water-soluble contrast through the tube and repeating the abdominal radiograph. 2. Previously ingested oral contrast material visible throughout the colon. 3. No evidence of bowel obstruction or large free air. Electronically Signed   By: Malachy Moan M.D.   On: 01/15/2023 07:09   IR GASTROSTOMY TUBE MOD SED  Result Date: 01/10/2023 INDICATION: 89 year old male with history of stroke and failure to thrive presenting for percutaneous gastrostomy tube placement. EXAM: PERC PLACEMENT GASTROSTOMY MEDICATIONS: The patient was currently receiving intravenous antibiotics as an inpatient. ANESTHESIA/SEDATION: Versed 1 mg IV; Fentanyl 50 mcg IV Moderate Sedation Time:  12 The patient was continuously monitored during the procedure by the interventional radiology nurse under my direct supervision. CONTRAST:  10mL OMNIPAQUE IOHEXOL 300 MG/ML SOLN - administered into the gastric lumen. FLUOROSCOPY TIME:  One mGy COMPLICATIONS: None immediate. PROCEDURE: Informed written consent was obtained  from the patient after a thorough discussion of the procedural risks, benefits and alternatives. All questions were addressed. Maximal Sterile barrier Technique was utilized including caps, mask, sterile gowns, sterile gloves, sterile drape, hand hygiene and skin antiseptic. A timeout was performed prior to the initiation of the procedure. The patient was placed on the procedure table in the supine position. Pre-procedure abdominal film confirmed visualization of the transverse colon. The patient was prepped and draped in usual sterile fashion. The stomach was insufflated with air via the indwelling nasogastric tube. Under fluoroscopy, a puncture site was selected and local analgesia achieved with 1% lidocaine infiltrated subcutaneously. Under fluoroscopic guidance, a gastropexy needle was passed into the stomach and the T-bar suture was released. Entry into the stomach was confirmed with fluoroscopy, aspiration of air, and injection of contrast material. This was repeated with an additional gastropexy suture (for a total of 2 fasteners). At the center of these gastropexy sutures, a dermatotomy was performed. An 18 gauge needle was  passed into the stomach at the site of this dermatotomy, and position within the gastric lumen again confirmed under fluoroscopy using aspiration of air and contrast injection. An Amplatz guidewire was passed through this needle and intraluminal placement within the stomach was confirmed by fluoroscopy. The needle was removed. Over the guidewire, the percutaneous tract was dilated using a 10 mm non-compliant balloon. The balloon was deflated, then pushed into the gastric lumen followed in concert by the 20 Fr gastrostomy tube. The retention balloon of the percutaneous gastrostomy tube was inflated with 20 mL of sterile water. The tube was withdrawn until the retention balloon was at the edge of the gastric lumen. The external bumper was brought to the abdominal wall. Contrast was injected  through the gastrostomy tube, confirming intraluminal positioning. The patient tolerated the procedure well without any immediate post-procedural complications. IMPRESSION: Technically successful placement of 20 Fr gastrostomy tube. Marliss Coots, MD [facility] Electronically Signed   By: Marliss Coots M.D.   On: 01/10/2023 09:41   DG CHEST PORT 1 VIEW  Result Date: 01/09/2023 CLINICAL DATA:  Fever EXAM: PORTABLE CHEST 1 VIEW COMPARISON:  01/01/2023 FINDINGS: Enteric tube extends into the stomach, beyond the inferior margin of the film. Stable heart size. Aortic atherosclerosis. Coronary artery stent. No focal airspace consolidation, pleural effusion, or pneumothorax. IMPRESSION: No acute cardiopulmonary findings Electronically Signed   By: Duanne Guess D.O.   On: 01/09/2023 13:09   DG Abd Portable 1V  Result Date: 01/06/2023 CLINICAL DATA:  Encounter for feeding tube EXAM: PORTABLE ABDOMEN - 1 VIEW COMPARISON:  Abdominal CT from 2 days ago FINDINGS: Feeding tube with tip at the distal stomach. Oral contrast is seen within nondilated colon. Clear lung bases.  Coronary stenting. IMPRESSION: Feeding tube with tip at the distal stomach. Electronically Signed   By: Tiburcio Pea M.D.   On: 01/06/2023 12:52   CT ABDOMEN WO CONTRAST  Result Date: 01/05/2023 CLINICAL DATA:  dysphagia.  Preop planning for gastrostomy. EXAM: CT ABDOMEN WITHOUT CONTRAST TECHNIQUE: Multidetector CT imaging of the abdomen was performed following the standard protocol without IV contrast. RADIATION DOSE REDUCTION: This exam was performed according to the departmental dose-optimization program which includes automated exposure control, adjustment of the mA and/or kV according to patient size and/or use of iterative reconstruction technique. COMPARISON:  Swallow eval, 01/04/2023 and 12/28/2022. Chest XR, 01/01/2023. FINDINGS: Lower chest: 4.5 cm dilatation of the imaged  ascending thoracic aorta. LAD stent. Severe burden of multivessel coronary atherosclerosis is present. Hepatobiliary: Normal noncontrast appearance of the liver without focal abnormality. Trace radiodense gallstones within the nondistended gallbladder. No gallbladder wall thickening, or biliary dilatation. Pancreas: No pancreatic ductal dilatation or surrounding inflammatory changes. Spleen: Normal in size without focal abnormality. Adrenals/Urinary Tract: Adrenal glands are unremarkable. Kidneys are normal, without renal calculi, focal lesion, or hydronephrosis. Stomach/Bowel: Stomach is nondistended and within normal limits. Small bore enteric feeding tube, with tip projecting at the proximal duodenum. Intraluminal contrast opacification of the colon, previously ingested. Imaged portions of the small bowel and colon are nondistended. No evidence of bowel wall thickening, distention, or inflammatory changes. 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 Moderate-to-severe burden of aortic atherosclerosis without aneurysmal dilatation. No enlarged abdominal lymph nodes. Other: Scaphoid abdomen.  No intra-abdominal ascites Musculoskeletal: Degenerative changes spine, including mild S-shaped thoracolumbar curvature. No acute osseous findings. IMPRESSION: 1. No acute abdominal process. 2. No intervening viscera. Anatomy is amenable for percutaneous gastrostomy placement. Final decision making is reserved  for the performing physician. 3. Incidental 4.5 cm ascending thoracic aortic dilatation, incompletely assessed. Ascending thoracic aortic aneurysm. Recommend semi-annual imaging followup by CTA or MRA and referral to cardiothoracic surgery if not already obtained This recommendation follows 2010 ACCF/AHA/AATS/ACR/ASA/SCA/SCAI/SIR/STS/SVM Guidelines for the Diagnosis and Management of Patients With Thoracic Aortic Disease. Circulation. 2010; 121: N629-B284. Aortic aneurysm NOS (ICD10-I71.9) 4.  Aortic Atherosclerosis (ICD10-I70.0). Additional incidental, chronic and senescent findings as above. Roanna Banning, MD [facility] Electronically Signed   By: Roanna Banning M.D.   On: 01/05/2023 12:03   DG Swallowing Func-Speech Pathology  Result Date: 01/04/2023 Table formatting from the original result was not included. Modified Barium Swallow Study Patient Details Name: James Rewerts Sr. MRN: [id number] Date of Birth: 1932-01-16 Today's Date: 01/04/2023 HPI/PMH: HPI: Pt is an 89 y.o. male who presented with AMS after a fall. CT head: Scattered areas of subarachnoid hemorrhage in the left occipital  lobe, bilateral high frontal lobes, and right temporal lobe. Small amount of subarachnoid hemorrhage posterior to the right midbrain extending into the cerebral aqueduct. CTH 8/26 with decreased size and density of previously seen multifocal SAH. PMH: COPD, prior NSTEMI / CHF, NSVT, PAF, on ASA81. Clinical Impression: Clinical Impression: Pt presents with a moderate oropharyngeal dysphagia characterized by deficits related to awareness and reduced strength. Feel that this is in part due to cognition, although also significantly affected by anatomical differences. Overall, pt with poor awareness of bolus presentations, leaving mouth wide open when presented with a spoon, straw, or cup and requiring Max cueing to close mouth and initiate. With larger boluses, there is posterior progression to the level of the pyriform sinuses before he initiates the swallow. He is consistently piecemeal swallowing which affects his overall coordination. Pt presents with reduced pharyngeal strength and suspected prominent cervical osteophytes create pharyngeal narrowing at the level of the valleculae, contributing to no noted epiglottic inversion. This may also be affected by Cortrak in place, although feel this is not an acute difference. Incomplete epiglottic inversion allows  the laryngeal vestibule to be open during the swallowing contributing to penetration of all consistencies during the swallow as well as after. With thicker consistencies, pt has increased pharyngeal residue collecting along the base of tongue, valleculae, pharyngeal wall, and aryepiglottic folds. Due to pt's reduced sensation and strength, this residue progresses into the laryngeal vestibule when pt is at rest. Observed penetration to the level of the vocal folds without sensation and without ability to clear after a cued cough (PAS 5) with thin liquids, nectar thick liquids, and honey thick liquids. Pureed textures were penetrated above the vocal folds and were not able to be expelled (PAS 3) with concern for continual progression through the trachea without sensation. Overall, feel that pt's cognition does not allow for consistent use of compensatory strategies at this time. Recommend he remain strictly NPO. Will continue to follow. Factors that may increase risk of adverse event in presence of aspiration Rubye Oaks & Clearance Coots 2021): Factors that may increase risk of adverse event in presence of aspiration Rubye Oaks & Clearance Coots 2021): Poor general health and/or compromised immunity; Reduced cognitive function; Frail or deconditioned; Dependence for feeding and/or oral hygiene; Inadequate oral hygiene Recommendations/Plan: Swallowing Evaluation Recommendations Swallowing Evaluation Recommendations Recommendations: NPO Medication Administration: Via alternative means Treatment Plan Treatment Plan Treatment recommendations: Therapy as outlined in treatment plan below Follow-up recommendations: Skilled nursing-short term rehab (<3 hours/day) Functional status assessment: Patient has had a recent decline in their functional status and demonstrates  the ability to make significant improvements in function in a reasonable and predictable amount of time. Treatment frequency: Min 2x/week Treatment duration: 2 weeks Interventions:  Aspiration precaution training; Trials of upgraded texture/liquids; Diet toleration management by SLP; Patient/family education Recommendations Recommendations for follow up therapy are one component of a multi-disciplinary discharge planning process, led by the attending physician.  Recommendations may be updated based on patient status, additional functional criteria and insurance authorization. Assessment: Orofacial Exam: Orofacial Exam Oral Cavity: Oral Hygiene: WFL Oral Cavity - Dentition: Poor condition; Missing dentition; Dentures, top; Dentures, not available Orofacial Anatomy: WFL Oral Motor/Sensory Function: WFL Anatomy: Anatomy: Suspected cervical osteophytes Boluses Administered: Boluses Administered Boluses Administered: Thin liquids (Level 0); Mildly thick liquids (Level 2, nectar thick); Moderately thick liquids (Level 3, honey thick); Puree  Oral Impairment Domain: Oral Impairment Domain Lip Closure: Escape from interlabial space or lateral juncture, no extension beyond vermillion border Tongue control during bolus hold: Posterior escape of greater than half of bolus Bolus transport/lingual motion: Delayed initiation of tongue motion (oral holding) Oral residue: Residue collection on oral structures Location of oral residue : Tongue; Palate Initiation of pharyngeal swallow : Pyriform sinuses  Pharyngeal Impairment Domain: Pharyngeal Impairment Domain Soft palate elevation: No bolus between soft palate (SP)/pharyngeal wall (PW) Laryngeal elevation: Complete superior movement of thyroid cartilage with complete approximation of arytenoids to epiglottic petiole Anterior hyoid excursion: Partial anterior movement Epiglottic movement: No inversion Laryngeal vestibule closure: Incomplete, narrow column air/contrast in laryngeal vestibule Pharyngeal stripping wave : Present - diminished Pharyngeal contraction (A/P view only): N/A Pharyngoesophageal segment opening: Minimal distention/minimal duration, marked  obstruction of flow Tongue base retraction: Narrow column of contrast or air between tongue base and PPW Pharyngeal residue: Majority of contrast within or on pharyngeal structures Location of pharyngeal residue: Valleculae; Pharyngeal wall; Pyriform sinuses; Aryepiglottic folds  Esophageal Impairment Domain: No data recorded Pill: No data recorded Penetration/Aspiration Scale Score: Penetration/Aspiration Scale Score 3.  Material enters airway, remains ABOVE vocal cords and not ejected out: Puree 5.  Material enters airway, CONTACTS cords and not ejected out: Thin liquids (Level 0); Mildly thick liquids (Level 2, nectar thick); Moderately thick liquids (Level 3, honey thick) Compensatory Strategies: Compensatory Strategies Compensatory strategies: Yes Straw: Ineffective Ineffective Straw: Thin liquid (Level 0)   General Information: Caregiver present: No  Diet Prior to this Study: NPO; Cortrak/Small bore NG tube   Temperature : Normal   Respiratory Status: WFL   Supplemental O2: None (Room air)   History of Recent Intubation: No  Behavior/Cognition: Alert; Cooperative; Confused; Requires cueing Self-Feeding Abilities: Dependent for feeding Baseline vocal quality/speech: Dysphonic; Hypophonia/low volume Volitional Cough: Able to elicit Volitional Swallow: Unable to elicit Exam Limitations: Excessive movement Goal Planning: Prognosis for improved oropharyngeal function: Fair Barriers to Reach Goals: Cognitive deficits; Severity of deficits; Time post onset; Overall medical prognosis No data recorded Patient/Family Stated Goal: none stated Consulted and agree with results and recommendations: Pt unable/family or caregiver not available Pain: Pain Assessment Pain Assessment: No/denies pain Breathing: 0 Negative Vocalization: 0 Facial Expression: 0 Body Language: 0 Consolability: 0 PAINAD Score: 0 End of Session: Start Time:SLP Start Time (ACUTE ONLY): 1121 Stop Time: SLP Stop Time (ACUTE ONLY): 1138 Time  Calculation:SLP Time Calculation (min) (ACUTE ONLY): 17 min Charges: SLP Evaluations $ SLP Speech Visit: 1 Visit SLP Evaluations $MBS Swallow: 1 Procedure $Swallowing Treatment: 1 Procedure $Speech Treatment for Individual: 1 Procedure SLP visit diagnosis: SLP Visit Diagnosis: Dysphagia, oropharyngeal phase (R13.12); Cognitive communication deficit (R41.841) Past Medical History: Past  Medical History: Diagnosis Date  Acquired hypothyroidism 08/15/2019  Acute systolic heart failure (HCC) 06/25/2020  Anemia 02/05/2020  boarderline anemic  Atopic dermatitis 12/05/2019  Bacterial pneumonia, unspecified 06/14/2012  06/14/2012 CXR w/ RML PNA >Zithromax and Rocephin    Bradycardia 11/28/2018  CAD (coronary artery disease) 07/07/2020  Cancer (HCC)   prostate cancer-seed implant  Closed fracture dislocation of right elbow   Closed fracture of right olecranon process 04/10/2020  COPD (chronic obstructive pulmonary disease) (HCC) 01/21/2020  Dermatitis 02/05/2020  Dizziness 02/19/2016  Essential hypertension 11/28/2018  History of prostate cancer   seed implant  Hyperlipemia   Hyperlipidemia, mixed 08/21/2008  Qualifier: Diagnosis of  By: Maple Hudson MD, Clinton D   LVH (left ventricular hypertrophy) 11/28/2018  Malnutrition of moderate degree (HCC) 08/15/2019  Mixed conductive and sensorineural hearing loss, 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  Moderate persistent asthma, uncomplicated 01/21/2020  Nonsustained ventricular tachycardia (HCC) 01/30/2019  NSTEMI (non-ST elevated myocardial infarction) (HCC) 06/21/2020  Other fatigue 02/05/2020  PAF (paroxysmal atrial fibrillation) (HCC) 06/25/2020  Seasonal and perennial allergic rhinitis 05/17/2007     Senile osteoporosis 08/15/2019  SINUSITIS, ACUTE 06/28/2007  Qualifier: Diagnosis of  By: Clent Ridges NP, Tammy    Ventricular ectopy 11/28/2018 Past Surgical History: Past Surgical History: Procedure Laterality Date  APPENDECTOMY    CORONARY ATHERECTOMY N/A 06/24/2020  Procedure: CORONARY ATHERECTOMY;  Surgeon: Iran Ouch, MD;   [facility];  Service: Cardiovascular;  Laterality: N/A;  CORONARY ULTRASOUND/IVUS N/A 06/24/2020  Procedure: Intravascular Ultrasound/IVUS;  Surgeon: Iran Ouch, MD;  [facility];  Service: Cardiovascular;  Laterality: N/A;  LEFT HEART CATH AND CORONARY ANGIOGRAPHY N/A 06/22/2020  Procedure: LEFT HEART CATH AND CORONARY ANGIOGRAPHY;  Surgeon: Yvonne Kendall, MD;  [facility];  Service: Cardiovascular;  Laterality: N/A;  ORIF ELBOW FRACTURE Right 04/10/2020  Procedure: OPEN REDUCTION INTERNAL FIXATION (ORIF) ELBOW/OLECRANON FRACTURE;  Surgeon: Teryl Lucy, MD;  Location: [facility];  Service: Orthopedics;  Laterality: Right; Gwynneth Aliment, M.A., CF-SLP Speech Language Pathology, Acute Rehabilitation Services Secure Chat preferred [phone] 01/04/2023, 1:45 PM  CT HEAD WO CONTRAST ( )  Result Date: 01/02/2023 CLINICAL DATA:  Mental status change, persistent or worsening CT EXAM: CT HEAD WITHOUT CONTRAST TECHNIQUE: Contiguous axial images were obtained from the base of the skull through the vertex without intravenous contrast. RADIATION DOSE REDUCTION: This exam was performed according to the departmental dose-optimization program which includes automated exposure control, adjustment of the mA and/or kV according to patient size and/or use of iterative reconstruction technique. COMPARISON:  Head 12/20/2022. FINDINGS: Brain: Decreased size and density of previously seen subarachnoid hemorrhage with a few areas of persistent small hyperdensity. Intermediate density right subdural hemorrhage is decreased in thickness, now 7 mm on the right and 3 mm on the left (previously 9 and 11 mm). No substantial midline shift. No evidence of acute large vascular territory infarct, mass lesion or hydrocephalus. Cerebral atrophy and chronic microvascular ischemic change. Basal ganglia and cerebellar calcifications. Vascular: No hyperdense vessel  identified. Calcific atherosclerosis. Skull: No acute fracture. Sinuses/Orbits: Clear sinuses.  No acute orbital findings. Other: No mastoid effusions. IMPRESSION: 1. Intermediate density bilateral subdural hemorrhage is decreased in thickness, now 7 mm on the right and 3 mm on the left (previously 9 and 11 mm when measured on that study). 2. Decreased size and density of previously seen multifocal subarachnoid hemorrhage. Electronically Signed   By: Feliberto Harts M.D.   On: 01/02/2023 09:27   DG CHEST PORT 1 VIEW  Result Date: 01/01/2023 CLINICAL DATA:  Cough EXAM: PORTABLE CHEST 1 VIEW COMPARISON:  12/26/2022 FINDINGS: Atherosclerotic calcification of the aortic arch. Coronary artery stent noted. Heart size within normal limits. The lungs appear clear.  No blunting of the costophrenic angles. Severe degenerative glenohumeral arthropathy on the right with elimination of the acromial humeral distance suggesting chronic right rotator cuff tear. A feeding tube extends into the stomach and beyond the inferior margin of today's image. IMPRESSION: 1. No acute findings. 2. Feeding tube extends into the stomach and beyond the inferior margin of today's image. 3. Severe degenerative glenohumeral arthropathy on the right with chronic right rotator cuff tear. Electronically Signed   By: Gaylyn Rong M.D.   On: 01/01/2023 13:21   DG Swallowing Func-Speech Pathology  Result Date: 12/28/2022 Blaine Hamper     12/28/2022  5:38 PM Modified Barium Swallow Study Patient Details Name: James Seminara Sr. MRN: [id number] Date of Birth: 1932-04-23 Today's Date: 12/28/2022 HPI/PMH: HPI: Pt is an 89 y.o. male who presented with AMS after a fall. CT head: Scattered areas of subarachnoid hemorrhage in the left occipital  lobe, bilateral high frontal lobes, and right temporal lobe. Small amount of subarachnoid hemorrhage posterior to the right midbrain extending into the cerebral aqueduct. PMH: COPD, prior NSTEMI /  CHF, NSVT, PAF, on ASA81. Clinical Impression: Clinical Impression: Patient presents with a mild-moderate oral dysphagia and a moderately impaired pharyngeal phase dysphagia as per this MBS. SLP suspects that patient has a chronic dysphagia that is exacerbated by his current cognitive state. During oral phase, patient with delayed anterior to posterior transit of boluses as well as impaired awareness to boluses, leading him to bite at spoon, hold liquids in mouth and have difficulty initiating straw sips. SLP assessed his swallowing with thin liquids and nectar thick liquids. Cortrak feeding tube was present during this test. Epiglottis appeared somewhat edematous and in addition, posterior pharyngeal wall was bulging (suspected cervical osteophytes but no radiologist present to confirm). This resulted in epiglottis making contact with posterior pharyngeal wall during swallow which in turn caused impacted pharyngeal clearance of boluses. After initial swallows of nectar thick and thin liquids, patient with moderate amount of barium remaining in vallecular sinus, posterior pharyngeal wall and pyriform sinus. Patient was frequently moving around and sliding down in chair, which prevented a clear view of PES or trachea. There was an instance of penetration above vocal cords visualized one time and suspected to be occuring frequently. SLP recommending continue NPO at this time but allow ice chips, water sips PRN. Plan for repeat MBS when patient's cognition has improved. Factors that may increase risk of adverse event in presence of aspiration Rubye Oaks [employer] Clearance Coots 2021): Factors that may increase risk of adverse event in presence of aspiration Rubye Oaks [employer] Clearance Coots 2021): Poor general health and/or compromised immunity; Reduced cognitive function; Frail or deconditioned; Dependence for feeding and/or oral hygiene; Inadequate oral hygiene Recommendations/Plan: Swallowing Evaluation Recommendations Swallowing Evaluation  Recommendations Recommendations: NPO Medication Administration: Via alternative means Oral care recommendations: Oral care QID (4x/day); Oral care before ice chips/water; Staff/trained caregiver to provide oral care Treatment Plan Treatment Plan Treatment recommendations: Therapy as outlined in treatment plan below Follow-up recommendations: Skilled nursing-short term rehab (<3 hours/day) Functional status assessment: Patient has had a recent decline in their functional status and demonstrates the ability to make significant improvements in function in a reasonable and predictable amount of time. Treatment frequency: Min 2x/week Treatment duration: 2 weeks Interventions: Aspiration precaution training; Trials of  upgraded texture/liquids; Diet toleration management by SLP; Patient/family education Recommendations Recommendations for follow up therapy are one component of a multi-disciplinary discharge planning process, led by the attending physician.  Recommendations may be updated based on patient status, additional functional criteria and insurance authorization. Assessment: Orofacial Exam: Orofacial Exam Oral Cavity: Oral Hygiene: WFL Oral Cavity - Dentition: Poor condition; Missing dentition; Dentures, top; Dentures, not available Orofacial Anatomy: WFL Anatomy: Anatomy: Suspected cervical osteophytes Boluses Administered: Boluses Administered Boluses Administered: Thin liquids (Level 0); Mildly thick liquids (Level 2, nectar thick)  Oral Impairment Domain: Oral Impairment Domain Lip Closure: No labial escape Tongue control during bolus hold: Not tested Bolus preparation/mastication: Slow prolonged chewing/mashing with complete recollection Bolus transport/lingual motion: Delayed initiation of tongue motion (oral holding) Oral residue: Residue collection on oral structures Location of oral residue : Tongue; Palate Initiation of pharyngeal swallow : Valleculae  Pharyngeal Impairment Domain: Pharyngeal Impairment  Domain Laryngeal vestibule closure: None, wide column air/contrast in laryngeal vestibule Pharyngeal stripping wave : Present - diminished Pharyngeal contraction (A/P view only): N/A Pharyngoesophageal segment opening: Partial distention/partial duration, partial obstruction of flow Tongue base retraction: Narrow column of contrast or air between tongue base and PPW Pharyngeal residue: Majority of contrast within or on pharyngeal structures Location of pharyngeal residue: Valleculae; Pharyngeal wall; Pyriform sinuses; Aryepiglottic folds  Esophageal Impairment Domain: No data recorded Pill: No data recorded Penetration/Aspiration Scale Score: Penetration/Aspiration Scale Score 3.  Material enters airway, remains ABOVE vocal cords and not ejected out: Mildly thick liquids (Level 2, nectar thick); Thin liquids (Level 0) Compensatory Strategies: Compensatory Strategies Compensatory strategies: No   General Information: Caregiver present: No  Diet Prior to this Study: NPO   Temperature : Normal   Respiratory Status: WFL   Supplemental O2: None (Room air)   History of Recent Intubation: No  Behavior/Cognition: Alert; Cooperative; Confused; Requires cueing; Doesn't follow directions Self-Feeding Abilities: Dependent for feeding Baseline vocal quality/speech: Not observed Volitional Cough: Unable to elicit Volitional Swallow: Unable to elicit Exam Limitations: Poor participation Goal Planning: Prognosis for improved oropharyngeal function: Good Barriers to Reach Goals: Cognitive deficits; Severity of deficits No data recorded Patient/Family Stated Goal: none stated Consulted and agree with results and recommendations: Pt unable/family or caregiver not available Pain: Pain Assessment Pain Assessment: Faces Faces Pain Scale: 0 Breathing: 0 Negative Vocalization: 0 Facial Expression: 0 Body Language: 0 Consolability: 0 PAINAD Score: 0 Pain Location: L leg with PROM Pain Descriptors / Indicators: Discomfort; Grimacing;  Guarding Pain Intervention(s): Limited activity within patient's tolerance; Monitored during session; Repositioned End of Session: Start Time:SLP Start Time (ACUTE ONLY): 1400 Stop Time: SLP Stop Time (ACUTE ONLY): 1417 Time Calculation:SLP Time Calculation (min) (ACUTE ONLY): 17 min Charges: SLP Evaluations $ SLP Speech Visit: 1 Visit SLP Evaluations $Swallowing Treatment: 1 Procedure SLP visit diagnosis: SLP Visit Diagnosis: Dysphagia, oropharyngeal phase (R13.12) Past Medical History: Past Medical History: Diagnosis Date  Acquired hypothyroidism 08/15/2019  Acute systolic heart failure (HCC) 06/25/2020  Anemia 02/05/2020  boarderline anemic  Atopic dermatitis 12/05/2019  Bacterial pneumonia, unspecified 06/14/2012  06/14/2012 CXR w/ RML PNA >Zithromax and Rocephin    Bradycardia 11/28/2018  CAD (coronary artery disease) 07/07/2020  Cancer (HCC)   prostate cancer-seed implant  Closed fracture dislocation of right elbow   Closed fracture of right olecranon process 04/10/2020  COPD (chronic obstructive pulmonary disease) (HCC) 01/21/2020  Dermatitis 02/05/2020  Dizziness 02/19/2016  Essential hypertension 11/28/2018  History of prostate cancer   seed implant  Hyperlipemia   Hyperlipidemia, mixed 08/21/2008  Qualifier:  Diagnosis of  By: Maple Hudson MD, Clinton D   LVH (left ventricular hypertrophy) 11/28/2018  Malnutrition of moderate degree (HCC) 08/15/2019  Mixed conductive and sensorineural hearing loss, 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  Moderate persistent asthma, uncomplicated 01/21/2020  Nonsustained ventricular tachycardia (HCC) 01/30/2019  NSTEMI (non-ST elevated myocardial infarction) (HCC) 06/21/2020  Other fatigue 02/05/2020  PAF (paroxysmal atrial fibrillation) (HCC) 06/25/2020  Seasonal and perennial allergic rhinitis 05/17/2007     Senile osteoporosis 08/15/2019  SINUSITIS, ACUTE 06/28/2007  Qualifier: Diagnosis of  By: Clent Ridges NP, Tammy    Ventricular ectopy 11/28/2018 Past Surgical History: Past Surgical History: Procedure Laterality Date   APPENDECTOMY    CORONARY ATHERECTOMY N/A 06/24/2020  Procedure: CORONARY ATHERECTOMY;  Surgeon: Iran Ouch, MD;  [facility];  Service: Cardiovascular;  Laterality: N/A;  CORONARY ULTRASOUND/IVUS N/A 06/24/2020  Procedure: Intravascular Ultrasound/IVUS;  Surgeon: Iran Ouch, MD;  [facility];  Service: Cardiovascular;  Laterality: N/A;  LEFT HEART CATH AND CORONARY ANGIOGRAPHY N/A 06/22/2020  Procedure: LEFT HEART CATH AND CORONARY ANGIOGRAPHY;  Surgeon: Yvonne Kendall, MD;  [facility];  Service: Cardiovascular;  Laterality: N/A;  ORIF ELBOW FRACTURE Right 04/10/2020  Procedure: OPEN REDUCTION INTERNAL FIXATION (ORIF) ELBOW/OLECRANON FRACTURE;  Surgeon: Teryl Lucy, MD;  Location: [facility];  Service: Orthopedics;  Laterality: Right; Angela Nevin, MA, CCC-SLP Speech Therapy   DG CHEST PORT 1 VIEW  Result Date: 12/26/2022 CLINICAL DATA:  Fever EXAM: PORTABLE CHEST 1 VIEW COMPARISON:  12/17/2022 FINDINGS: Mild right basilar atelectasis. Lungs are otherwise clear. No pneumothorax or pleural effusion. Cardiac size is within normal limits. Previously noted trace perihilar interstitial pulmonary edema has resolved. Nasoenteric feeding tube has been placed, extending into the upper abdomen beyond the margin of the examination. IMPRESSION: 1. Mild right basilar atelectasis. Electronically Signed   By: Helyn Numbers M.D.   On: 12/26/2022 19:39   CT HEAD WO CONTRAST ( )  Result Date: 12/20/2022 CLINICAL DATA:  Subarachnoid hemorrhage [facility]) EXAM: CT HEAD WITHOUT CONTRAST TECHNIQUE: Contiguous axial images were obtained from the base of the skull through the vertex without intravenous contrast. RADIATION DOSE REDUCTION: This exam was performed according to the departmental dose-optimization program which includes automated exposure control, adjustment of the mA and/or kV according to patient size and/or use of iterative  reconstruction technique. COMPARISON:  CT head 12/15/2022 FINDINGS: Brain: Cerebral ventricle sizes are concordant with the degree of cerebral volume loss. Patchy and confluent areas of decreased attenuation are noted throughout the deep and periventricular white matter of the cerebral hemispheres bilaterally, compatible with chronic microvascular ischemic disease. No evidence of large-territorial acute infarction. Question slightly increased in size (limited evaluation due to motion artifact) high frontal bilateral, right greater than left, subarachnoid hemorrhages with question intraparenchymal component. Associated developing mild vasogenic edema on the right. Underlying mass is not excluded. Interval development of a 3 mm right parafalcine subdural hematoma (5:22). Nonspecific bilateral basal ganglia mineralization. No mass effect or midline shift. No hydrocephalus. Basilar cisterns are patent. Vascular: No hyperdense vessel. Atherosclerotic calcifications are present within the cavernous internal carotid arteries. Skull: No acute fracture or focal lesion. Sinuses/Orbits: Paranasal sinuses and mastoid air cells are clear. Bilateral lens replacement. Otherwise the orbits are unremarkable. Other: None. IMPRESSION: 1. Interval development of a 3 mm right parafalcine subdural hematoma. 2. Question slightly increased in size (limited evaluation due to motion artifact) high frontal bilateral, right greater than left, subarachnoid hemorrhages with question intraparenchymal component. Associated developing mild vasogenic edema on  the right. Underlying mass is not excluded. Recommend MRI brain with and without contrast for further evaluation. 3. Unable to evaluate for previously visualized left occipital and right temporal cirrhotic no hemorrhages due to motion artifact. These results will be called to the ordering clinician or representative by the Radiologist Assistant, and communication documented in the PACS or  [employer]. Electronically Signed   By: Tish Frederickson M.D.   On: 12/20/2022 19:22    DISCHARGE EXAMINATION: Vitals:   01/19/23 0101 01/19/23 0200 01/19/23 0301 01/19/23 0739  BP:  108/66 117/73 118/78  Pulse:  91 86 98  Resp:  (!) 23 (!) 21 (!) 21  Temp:   97.9 F (36.6 C) 99.4 F (37.4 C)  TempSrc:   Oral Axillary  SpO2:  96% 95% 100%  Weight: 56.1 kg     Height:       General appearance: noted be awake today but not very communicative.  Remains encephalopathic Resp: Clear to auscultation bilaterally.  Normal effort Cardio: S1-S2 is normal regular.  No S3-S4.  No rubs murmurs or bruit GI: Abdomen is soft.  Nontender nondistended.  Bowel sounds are present normal.  No masses organomegaly PEG tube is noted   DISPOSITION: SNF  Discharge Instructions     Diet - low sodium heart healthy   Complete by: As directed    Increase activity slowly   Complete by: As directed    No wound care   Complete by: As directed          Allergies as of 01/19/2023       Reactions   Clarithromycin    REACTION: GI upset        Medication List     STOP taking these medications    aspirin EC 81 MG tablet   BOOST NUTRITIONAL ENERGY PO Replaced by: feeding supplement (OSMOLITE 1.5 CAL) Liqd   metoprolol succinate 50 MG 24 hr tablet Commonly known as: TOPROL-XL   terbinafine 250 MG tablet Commonly known as: LAMISIL       TAKE these medications    albuterol 108 (90 Base) MCG/ACT inhaler Commonly known as: VENTOLIN HFA Inhale 2 puffs into the lungs every 6 (six) hours as needed for wheezing or shortness of breath.   amiodarone 200 MG tablet Commonly known as: PACERONE 200 mg twice a day through the feeding tube for 1 week followed by 200 mg once a day.   atorvastatin 80 MG tablet Commonly known as: LIPITOR Take 0.5 tablets (40 mg total) by mouth every morning. What changed:  how much to take when to take this   famotidine 40 MG/5ML suspension Commonly known  as: PEPCID Take 5 mLs (40 mg total) by mouth daily.   feeding supplement (OSMOLITE 1.5 CAL) Liqd Place 1,000 mLs into feeding tube continuous. 55 mL/h Replaces: BOOST NUTRITIONAL ENERGY PO   feeding supplement (PROSource TF20) liquid Place 60 mLs into feeding tube daily.   ferrous sulfate 325 (65 FE) MG tablet Commonly known as: FerrouSul Take 1 tablet (325 mg total) by mouth daily with breakfast.   fexofenadine 180 MG tablet Commonly known as: ALLEGRA Take 180 mg by mouth daily.   free water Soln Place 75 mLs into feeding tube every 6 (six) hours.   ipratropium-albuterol 0.5-2.5 (3) MG/3ML Soln Commonly known as: DUONEB Take 3 mLs by nebulization every 4 (four) hours as needed (shortness of breath or wheezing).   levothyroxine 175 MCG tablet Commonly known as: SYNTHROID TAKE ONE TABLET BY MOUTH BEFORE  BREAKFAST   metoprolol tartrate 25 MG tablet Commonly known as: LOPRESSOR Place 1 tablet (25 mg total) into feeding tube 2 (two) times daily.   MULTIVITAMIN ADULT PO Take 1 tablet by mouth daily. Unknown strength   sulfamethoxazole-trimethoprim 800-160 MG tablet Commonly known as: BACTRIM DS Place 1 tablet into feeding tube every 12 (twelve) hours for 3 days.   thiamine 100 MG tablet Commonly known as: Vitamin B-1 Place 1 tablet (100 mg total) into feeding tube daily.          Follow-up Information     Jadene Pierini, MD Follow up.   Specialty: Neurosurgery Contact information: [address] [place] [address] [place] [address] [phone]         Marcine Matar, MD Follow up.   Specialty: Urology Contact information: [address] [place] [address] [phone]                 TOTAL DISCHARGE TIME: 35 minutes  Ramaj Frangos Rito Ehrlich  [facility] Pager on [email or web address]  01/19/2023, 9:20 AM

## 2023-01-23 DIAGNOSIS — Z931 Gastrostomy status: Secondary | ICD-10-CM | POA: Diagnosis not present

## 2023-01-23 DIAGNOSIS — I5022 Chronic systolic (congestive) heart failure: Secondary | ICD-10-CM | POA: Diagnosis not present

## 2023-01-23 DIAGNOSIS — N139 Obstructive and reflux uropathy, unspecified: Secondary | ICD-10-CM | POA: Diagnosis not present

## 2023-01-23 DIAGNOSIS — G936 Cerebral edema: Secondary | ICD-10-CM | POA: Diagnosis not present

## 2023-01-23 DIAGNOSIS — D649 Anemia, unspecified: Secondary | ICD-10-CM | POA: Diagnosis not present

## 2023-01-23 DIAGNOSIS — E46 Unspecified protein-calorie malnutrition: Secondary | ICD-10-CM | POA: Diagnosis not present

## 2023-01-23 DIAGNOSIS — S066X0D Traumatic subarachnoid hemorrhage without loss of consciousness, subsequent encounter: Secondary | ICD-10-CM | POA: Diagnosis not present

## 2023-01-23 DIAGNOSIS — I2510 Atherosclerotic heart disease of native coronary artery without angina pectoris: Secondary | ICD-10-CM | POA: Diagnosis not present

## 2023-01-23 DIAGNOSIS — E039 Hypothyroidism, unspecified: Secondary | ICD-10-CM | POA: Diagnosis not present

## 2023-01-23 DIAGNOSIS — J4540 Moderate persistent asthma, uncomplicated: Secondary | ICD-10-CM | POA: Diagnosis not present

## 2023-01-23 DIAGNOSIS — R1310 Dysphagia, unspecified: Secondary | ICD-10-CM | POA: Diagnosis not present

## 2023-01-23 DIAGNOSIS — I480 Paroxysmal atrial fibrillation: Secondary | ICD-10-CM | POA: Diagnosis not present

## 2023-02-06 DIAGNOSIS — M25532 Pain in left wrist: Secondary | ICD-10-CM | POA: Diagnosis not present

## 2023-02-06 DIAGNOSIS — H1010 Acute atopic conjunctivitis, unspecified eye: Secondary | ICD-10-CM | POA: Diagnosis not present

## 2023-02-08 DIAGNOSIS — R338 Other retention of urine: Secondary | ICD-10-CM | POA: Diagnosis not present

## 2023-02-16 DIAGNOSIS — I609 Nontraumatic subarachnoid hemorrhage, unspecified: Secondary | ICD-10-CM | POA: Diagnosis not present

## 2023-02-16 DIAGNOSIS — Z681 Body mass index (BMI) 19 or less, adult: Secondary | ICD-10-CM | POA: Diagnosis not present

## 2023-02-22 DIAGNOSIS — S066X0D Traumatic subarachnoid hemorrhage without loss of consciousness, subsequent encounter: Secondary | ICD-10-CM | POA: Diagnosis not present

## 2023-02-22 DIAGNOSIS — W19XXXD Unspecified fall, subsequent encounter: Secondary | ICD-10-CM | POA: Diagnosis not present

## 2023-02-27 DIAGNOSIS — W19XXXD Unspecified fall, subsequent encounter: Secondary | ICD-10-CM | POA: Diagnosis not present

## 2023-02-27 DIAGNOSIS — S066X0D Traumatic subarachnoid hemorrhage without loss of consciousness, subsequent encounter: Secondary | ICD-10-CM | POA: Diagnosis not present

## 2023-02-28 DIAGNOSIS — S066X0D Traumatic subarachnoid hemorrhage without loss of consciousness, subsequent encounter: Secondary | ICD-10-CM | POA: Diagnosis not present

## 2023-02-28 DIAGNOSIS — W19XXXD Unspecified fall, subsequent encounter: Secondary | ICD-10-CM | POA: Diagnosis not present

## 2023-03-01 DIAGNOSIS — W19XXXD Unspecified fall, subsequent encounter: Secondary | ICD-10-CM | POA: Diagnosis not present

## 2023-03-01 DIAGNOSIS — Z931 Gastrostomy status: Secondary | ICD-10-CM | POA: Diagnosis not present

## 2023-03-01 DIAGNOSIS — S066X0D Traumatic subarachnoid hemorrhage without loss of consciousness, subsequent encounter: Secondary | ICD-10-CM | POA: Diagnosis not present

## 2023-03-02 DIAGNOSIS — W19XXXD Unspecified fall, subsequent encounter: Secondary | ICD-10-CM | POA: Diagnosis not present

## 2023-03-02 DIAGNOSIS — S066X0D Traumatic subarachnoid hemorrhage without loss of consciousness, subsequent encounter: Secondary | ICD-10-CM | POA: Diagnosis not present

## 2023-03-06 ENCOUNTER — Ambulatory Visit: Payer: Medicare HMO | Admitting: Cardiology

## 2023-03-06 DIAGNOSIS — S066X0D Traumatic subarachnoid hemorrhage without loss of consciousness, subsequent encounter: Secondary | ICD-10-CM | POA: Diagnosis not present

## 2023-03-06 DIAGNOSIS — R2689 Other abnormalities of gait and mobility: Secondary | ICD-10-CM | POA: Diagnosis not present

## 2023-03-06 DIAGNOSIS — W19XXXD Unspecified fall, subsequent encounter: Secondary | ICD-10-CM | POA: Diagnosis not present

## 2023-03-06 DIAGNOSIS — Z931 Gastrostomy status: Secondary | ICD-10-CM | POA: Diagnosis not present

## 2023-03-06 DIAGNOSIS — E46 Unspecified protein-calorie malnutrition: Secondary | ICD-10-CM | POA: Diagnosis not present

## 2023-03-07 DIAGNOSIS — W19XXXD Unspecified fall, subsequent encounter: Secondary | ICD-10-CM | POA: Diagnosis not present

## 2023-03-07 DIAGNOSIS — S066X0D Traumatic subarachnoid hemorrhage without loss of consciousness, subsequent encounter: Secondary | ICD-10-CM | POA: Diagnosis not present

## 2023-03-08 ENCOUNTER — Telehealth (HOSPITAL_COMMUNITY): Payer: Self-pay

## 2023-03-08 DIAGNOSIS — W19XXXD Unspecified fall, subsequent encounter: Secondary | ICD-10-CM | POA: Diagnosis not present

## 2023-03-08 DIAGNOSIS — S066X0D Traumatic subarachnoid hemorrhage without loss of consciousness, subsequent encounter: Secondary | ICD-10-CM | POA: Diagnosis not present

## 2023-03-08 NOTE — Telephone Encounter (Signed)
Called facility to schedule peg replacement, no answer, left vm. AB

## 2023-03-09 DIAGNOSIS — S066X0D Traumatic subarachnoid hemorrhage without loss of consciousness, subsequent encounter: Secondary | ICD-10-CM | POA: Diagnosis not present

## 2023-03-09 DIAGNOSIS — W19XXXD Unspecified fall, subsequent encounter: Secondary | ICD-10-CM | POA: Diagnosis not present

## 2023-03-13 DIAGNOSIS — R338 Other retention of urine: Secondary | ICD-10-CM | POA: Diagnosis not present

## 2023-03-15 DIAGNOSIS — I2510 Atherosclerotic heart disease of native coronary artery without angina pectoris: Secondary | ICD-10-CM | POA: Diagnosis not present

## 2023-03-15 DIAGNOSIS — N400 Enlarged prostate without lower urinary tract symptoms: Secondary | ICD-10-CM | POA: Diagnosis not present

## 2023-03-15 DIAGNOSIS — E039 Hypothyroidism, unspecified: Secondary | ICD-10-CM | POA: Diagnosis not present

## 2023-03-15 DIAGNOSIS — I480 Paroxysmal atrial fibrillation: Secondary | ICD-10-CM | POA: Diagnosis not present

## 2023-03-16 DIAGNOSIS — N390 Urinary tract infection, site not specified: Secondary | ICD-10-CM | POA: Diagnosis not present

## 2023-03-20 DIAGNOSIS — R41841 Cognitive communication deficit: Secondary | ICD-10-CM | POA: Diagnosis not present

## 2023-03-20 DIAGNOSIS — E785 Hyperlipidemia, unspecified: Secondary | ICD-10-CM | POA: Diagnosis not present

## 2023-03-20 DIAGNOSIS — R531 Weakness: Secondary | ICD-10-CM | POA: Diagnosis not present

## 2023-03-20 DIAGNOSIS — Z9181 History of falling: Secondary | ICD-10-CM | POA: Diagnosis not present

## 2023-03-20 DIAGNOSIS — R2689 Other abnormalities of gait and mobility: Secondary | ICD-10-CM | POA: Diagnosis not present

## 2023-03-20 DIAGNOSIS — I480 Paroxysmal atrial fibrillation: Secondary | ICD-10-CM | POA: Diagnosis not present

## 2023-03-20 DIAGNOSIS — R1312 Dysphagia, oropharyngeal phase: Secondary | ICD-10-CM | POA: Diagnosis not present

## 2023-03-20 DIAGNOSIS — N401 Enlarged prostate with lower urinary tract symptoms: Secondary | ICD-10-CM | POA: Diagnosis not present

## 2023-03-20 DIAGNOSIS — S066X0D Traumatic subarachnoid hemorrhage without loss of consciousness, subsequent encounter: Secondary | ICD-10-CM | POA: Diagnosis not present

## 2023-03-20 DIAGNOSIS — M6281 Muscle weakness (generalized): Secondary | ICD-10-CM | POA: Diagnosis not present

## 2023-03-21 DIAGNOSIS — Z9181 History of falling: Secondary | ICD-10-CM | POA: Diagnosis not present

## 2023-03-21 DIAGNOSIS — R41841 Cognitive communication deficit: Secondary | ICD-10-CM | POA: Diagnosis not present

## 2023-03-21 DIAGNOSIS — R531 Weakness: Secondary | ICD-10-CM | POA: Diagnosis not present

## 2023-03-21 DIAGNOSIS — M6281 Muscle weakness (generalized): Secondary | ICD-10-CM | POA: Diagnosis not present

## 2023-03-21 DIAGNOSIS — R2689 Other abnormalities of gait and mobility: Secondary | ICD-10-CM | POA: Diagnosis not present

## 2023-03-21 DIAGNOSIS — R1312 Dysphagia, oropharyngeal phase: Secondary | ICD-10-CM | POA: Diagnosis not present

## 2023-03-21 DIAGNOSIS — S066X0D Traumatic subarachnoid hemorrhage without loss of consciousness, subsequent encounter: Secondary | ICD-10-CM | POA: Diagnosis not present

## 2023-03-22 ENCOUNTER — Telehealth (HOSPITAL_COMMUNITY): Payer: Self-pay

## 2023-03-22 DIAGNOSIS — M6281 Muscle weakness (generalized): Secondary | ICD-10-CM | POA: Diagnosis not present

## 2023-03-22 DIAGNOSIS — R41841 Cognitive communication deficit: Secondary | ICD-10-CM | POA: Diagnosis not present

## 2023-03-22 DIAGNOSIS — Z9181 History of falling: Secondary | ICD-10-CM | POA: Diagnosis not present

## 2023-03-22 DIAGNOSIS — R531 Weakness: Secondary | ICD-10-CM | POA: Diagnosis not present

## 2023-03-22 DIAGNOSIS — R2689 Other abnormalities of gait and mobility: Secondary | ICD-10-CM | POA: Diagnosis not present

## 2023-03-22 DIAGNOSIS — S066X0D Traumatic subarachnoid hemorrhage without loss of consciousness, subsequent encounter: Secondary | ICD-10-CM | POA: Diagnosis not present

## 2023-03-22 DIAGNOSIS — R1312 Dysphagia, oropharyngeal phase: Secondary | ICD-10-CM | POA: Diagnosis not present

## 2023-03-22 NOTE — Telephone Encounter (Signed)
On hold for right now. Will reach back out to facility regarding need for peg. AB

## 2023-03-23 DIAGNOSIS — S066X0D Traumatic subarachnoid hemorrhage without loss of consciousness, subsequent encounter: Secondary | ICD-10-CM | POA: Diagnosis not present

## 2023-03-23 DIAGNOSIS — R531 Weakness: Secondary | ICD-10-CM | POA: Diagnosis not present

## 2023-03-23 DIAGNOSIS — R41841 Cognitive communication deficit: Secondary | ICD-10-CM | POA: Diagnosis not present

## 2023-03-23 DIAGNOSIS — R1312 Dysphagia, oropharyngeal phase: Secondary | ICD-10-CM | POA: Diagnosis not present

## 2023-03-23 DIAGNOSIS — R2689 Other abnormalities of gait and mobility: Secondary | ICD-10-CM | POA: Diagnosis not present

## 2023-03-23 DIAGNOSIS — Z9181 History of falling: Secondary | ICD-10-CM | POA: Diagnosis not present

## 2023-03-23 DIAGNOSIS — M6281 Muscle weakness (generalized): Secondary | ICD-10-CM | POA: Diagnosis not present

## 2023-03-25 DIAGNOSIS — M6281 Muscle weakness (generalized): Secondary | ICD-10-CM | POA: Diagnosis not present

## 2023-03-25 DIAGNOSIS — R531 Weakness: Secondary | ICD-10-CM | POA: Diagnosis not present

## 2023-03-25 DIAGNOSIS — R41841 Cognitive communication deficit: Secondary | ICD-10-CM | POA: Diagnosis not present

## 2023-03-25 DIAGNOSIS — S066X0D Traumatic subarachnoid hemorrhage without loss of consciousness, subsequent encounter: Secondary | ICD-10-CM | POA: Diagnosis not present

## 2023-03-25 DIAGNOSIS — R2689 Other abnormalities of gait and mobility: Secondary | ICD-10-CM | POA: Diagnosis not present

## 2023-03-25 DIAGNOSIS — Z9181 History of falling: Secondary | ICD-10-CM | POA: Diagnosis not present

## 2023-03-25 DIAGNOSIS — R1312 Dysphagia, oropharyngeal phase: Secondary | ICD-10-CM | POA: Diagnosis not present

## 2023-03-26 DIAGNOSIS — N401 Enlarged prostate with lower urinary tract symptoms: Secondary | ICD-10-CM | POA: Diagnosis not present

## 2023-03-26 DIAGNOSIS — M6281 Muscle weakness (generalized): Secondary | ICD-10-CM | POA: Diagnosis not present

## 2023-03-26 DIAGNOSIS — Z9181 History of falling: Secondary | ICD-10-CM | POA: Diagnosis not present

## 2023-03-26 DIAGNOSIS — R2689 Other abnormalities of gait and mobility: Secondary | ICD-10-CM | POA: Diagnosis not present

## 2023-03-26 DIAGNOSIS — I480 Paroxysmal atrial fibrillation: Secondary | ICD-10-CM | POA: Diagnosis not present

## 2023-03-26 DIAGNOSIS — S066X0D Traumatic subarachnoid hemorrhage without loss of consciousness, subsequent encounter: Secondary | ICD-10-CM | POA: Diagnosis not present

## 2023-03-26 DIAGNOSIS — E785 Hyperlipidemia, unspecified: Secondary | ICD-10-CM | POA: Diagnosis not present

## 2023-03-26 DIAGNOSIS — R41841 Cognitive communication deficit: Secondary | ICD-10-CM | POA: Diagnosis not present

## 2023-03-26 DIAGNOSIS — R531 Weakness: Secondary | ICD-10-CM | POA: Diagnosis not present

## 2023-03-26 DIAGNOSIS — R1312 Dysphagia, oropharyngeal phase: Secondary | ICD-10-CM | POA: Diagnosis not present

## 2023-03-27 DIAGNOSIS — R41841 Cognitive communication deficit: Secondary | ICD-10-CM | POA: Diagnosis not present

## 2023-03-27 DIAGNOSIS — N401 Enlarged prostate with lower urinary tract symptoms: Secondary | ICD-10-CM | POA: Diagnosis not present

## 2023-03-27 DIAGNOSIS — I480 Paroxysmal atrial fibrillation: Secondary | ICD-10-CM | POA: Diagnosis not present

## 2023-03-27 DIAGNOSIS — R2689 Other abnormalities of gait and mobility: Secondary | ICD-10-CM | POA: Diagnosis not present

## 2023-03-27 DIAGNOSIS — Z9181 History of falling: Secondary | ICD-10-CM | POA: Diagnosis not present

## 2023-03-27 DIAGNOSIS — M6281 Muscle weakness (generalized): Secondary | ICD-10-CM | POA: Diagnosis not present

## 2023-03-27 DIAGNOSIS — R1312 Dysphagia, oropharyngeal phase: Secondary | ICD-10-CM | POA: Diagnosis not present

## 2023-03-27 DIAGNOSIS — E785 Hyperlipidemia, unspecified: Secondary | ICD-10-CM | POA: Diagnosis not present

## 2023-03-27 DIAGNOSIS — R531 Weakness: Secondary | ICD-10-CM | POA: Diagnosis not present

## 2023-03-27 DIAGNOSIS — S066X0D Traumatic subarachnoid hemorrhage without loss of consciousness, subsequent encounter: Secondary | ICD-10-CM | POA: Diagnosis not present

## 2023-03-28 DIAGNOSIS — R531 Weakness: Secondary | ICD-10-CM | POA: Diagnosis not present

## 2023-03-28 DIAGNOSIS — R41841 Cognitive communication deficit: Secondary | ICD-10-CM | POA: Diagnosis not present

## 2023-03-28 DIAGNOSIS — Z9181 History of falling: Secondary | ICD-10-CM | POA: Diagnosis not present

## 2023-03-28 DIAGNOSIS — M6281 Muscle weakness (generalized): Secondary | ICD-10-CM | POA: Diagnosis not present

## 2023-03-28 DIAGNOSIS — R2689 Other abnormalities of gait and mobility: Secondary | ICD-10-CM | POA: Diagnosis not present

## 2023-03-28 DIAGNOSIS — R1312 Dysphagia, oropharyngeal phase: Secondary | ICD-10-CM | POA: Diagnosis not present

## 2023-03-28 DIAGNOSIS — S066X0D Traumatic subarachnoid hemorrhage without loss of consciousness, subsequent encounter: Secondary | ICD-10-CM | POA: Diagnosis not present

## 2023-03-29 ENCOUNTER — Ambulatory Visit: Payer: Medicare HMO | Admitting: Cardiology

## 2023-03-29 DIAGNOSIS — R41841 Cognitive communication deficit: Secondary | ICD-10-CM | POA: Diagnosis not present

## 2023-03-29 DIAGNOSIS — R2689 Other abnormalities of gait and mobility: Secondary | ICD-10-CM | POA: Diagnosis not present

## 2023-03-29 DIAGNOSIS — R1312 Dysphagia, oropharyngeal phase: Secondary | ICD-10-CM | POA: Diagnosis not present

## 2023-03-29 DIAGNOSIS — M6281 Muscle weakness (generalized): Secondary | ICD-10-CM | POA: Diagnosis not present

## 2023-03-29 DIAGNOSIS — N3090 Cystitis, unspecified without hematuria: Secondary | ICD-10-CM | POA: Diagnosis not present

## 2023-03-29 DIAGNOSIS — S066X0D Traumatic subarachnoid hemorrhage without loss of consciousness, subsequent encounter: Secondary | ICD-10-CM | POA: Diagnosis not present

## 2023-03-29 DIAGNOSIS — Z9181 History of falling: Secondary | ICD-10-CM | POA: Diagnosis not present

## 2023-03-29 DIAGNOSIS — R531 Weakness: Secondary | ICD-10-CM | POA: Diagnosis not present

## 2023-03-29 DIAGNOSIS — E43 Unspecified severe protein-calorie malnutrition: Secondary | ICD-10-CM | POA: Diagnosis not present

## 2023-03-30 DIAGNOSIS — R531 Weakness: Secondary | ICD-10-CM | POA: Diagnosis not present

## 2023-03-30 DIAGNOSIS — R1312 Dysphagia, oropharyngeal phase: Secondary | ICD-10-CM | POA: Diagnosis not present

## 2023-03-30 DIAGNOSIS — S066X0D Traumatic subarachnoid hemorrhage without loss of consciousness, subsequent encounter: Secondary | ICD-10-CM | POA: Diagnosis not present

## 2023-03-30 DIAGNOSIS — R2689 Other abnormalities of gait and mobility: Secondary | ICD-10-CM | POA: Diagnosis not present

## 2023-03-30 DIAGNOSIS — M6281 Muscle weakness (generalized): Secondary | ICD-10-CM | POA: Diagnosis not present

## 2023-03-30 DIAGNOSIS — R41841 Cognitive communication deficit: Secondary | ICD-10-CM | POA: Diagnosis not present

## 2023-03-30 DIAGNOSIS — Z9181 History of falling: Secondary | ICD-10-CM | POA: Diagnosis not present

## 2023-03-31 DIAGNOSIS — Z9181 History of falling: Secondary | ICD-10-CM | POA: Diagnosis not present

## 2023-03-31 DIAGNOSIS — R531 Weakness: Secondary | ICD-10-CM | POA: Diagnosis not present

## 2023-03-31 DIAGNOSIS — E43 Unspecified severe protein-calorie malnutrition: Secondary | ICD-10-CM | POA: Diagnosis not present

## 2023-03-31 DIAGNOSIS — N3090 Cystitis, unspecified without hematuria: Secondary | ICD-10-CM | POA: Diagnosis not present

## 2023-03-31 DIAGNOSIS — R2689 Other abnormalities of gait and mobility: Secondary | ICD-10-CM | POA: Diagnosis not present

## 2023-03-31 DIAGNOSIS — R1312 Dysphagia, oropharyngeal phase: Secondary | ICD-10-CM | POA: Diagnosis not present

## 2023-03-31 DIAGNOSIS — S066X0D Traumatic subarachnoid hemorrhage without loss of consciousness, subsequent encounter: Secondary | ICD-10-CM | POA: Diagnosis not present

## 2023-03-31 DIAGNOSIS — R41841 Cognitive communication deficit: Secondary | ICD-10-CM | POA: Diagnosis not present

## 2023-03-31 DIAGNOSIS — M6281 Muscle weakness (generalized): Secondary | ICD-10-CM | POA: Diagnosis not present

## 2023-04-03 DIAGNOSIS — R531 Weakness: Secondary | ICD-10-CM | POA: Diagnosis not present

## 2023-04-03 DIAGNOSIS — S066X0D Traumatic subarachnoid hemorrhage without loss of consciousness, subsequent encounter: Secondary | ICD-10-CM | POA: Diagnosis not present

## 2023-04-03 DIAGNOSIS — R41841 Cognitive communication deficit: Secondary | ICD-10-CM | POA: Diagnosis not present

## 2023-04-03 DIAGNOSIS — R1312 Dysphagia, oropharyngeal phase: Secondary | ICD-10-CM | POA: Diagnosis not present

## 2023-04-03 DIAGNOSIS — R2689 Other abnormalities of gait and mobility: Secondary | ICD-10-CM | POA: Diagnosis not present

## 2023-04-03 DIAGNOSIS — M6281 Muscle weakness (generalized): Secondary | ICD-10-CM | POA: Diagnosis not present

## 2023-04-03 DIAGNOSIS — Z9181 History of falling: Secondary | ICD-10-CM | POA: Diagnosis not present

## 2023-04-04 DIAGNOSIS — F419 Anxiety disorder, unspecified: Secondary | ICD-10-CM | POA: Diagnosis not present

## 2023-04-04 DIAGNOSIS — R2689 Other abnormalities of gait and mobility: Secondary | ICD-10-CM | POA: Diagnosis not present

## 2023-04-04 DIAGNOSIS — Z9181 History of falling: Secondary | ICD-10-CM | POA: Diagnosis not present

## 2023-04-04 DIAGNOSIS — M6281 Muscle weakness (generalized): Secondary | ICD-10-CM | POA: Diagnosis not present

## 2023-04-04 DIAGNOSIS — R41841 Cognitive communication deficit: Secondary | ICD-10-CM | POA: Diagnosis not present

## 2023-04-04 DIAGNOSIS — S066X0D Traumatic subarachnoid hemorrhage without loss of consciousness, subsequent encounter: Secondary | ICD-10-CM | POA: Diagnosis not present

## 2023-04-04 DIAGNOSIS — R1312 Dysphagia, oropharyngeal phase: Secondary | ICD-10-CM | POA: Diagnosis not present

## 2023-04-04 DIAGNOSIS — N3090 Cystitis, unspecified without hematuria: Secondary | ICD-10-CM | POA: Diagnosis not present

## 2023-04-04 DIAGNOSIS — R531 Weakness: Secondary | ICD-10-CM | POA: Diagnosis not present

## 2023-04-05 DIAGNOSIS — R1312 Dysphagia, oropharyngeal phase: Secondary | ICD-10-CM | POA: Diagnosis not present

## 2023-04-05 DIAGNOSIS — R2689 Other abnormalities of gait and mobility: Secondary | ICD-10-CM | POA: Diagnosis not present

## 2023-04-05 DIAGNOSIS — S066X0D Traumatic subarachnoid hemorrhage without loss of consciousness, subsequent encounter: Secondary | ICD-10-CM | POA: Diagnosis not present

## 2023-04-05 DIAGNOSIS — R41841 Cognitive communication deficit: Secondary | ICD-10-CM | POA: Diagnosis not present

## 2023-04-05 DIAGNOSIS — R531 Weakness: Secondary | ICD-10-CM | POA: Diagnosis not present

## 2023-04-05 DIAGNOSIS — M6281 Muscle weakness (generalized): Secondary | ICD-10-CM | POA: Diagnosis not present

## 2023-04-05 DIAGNOSIS — Z9181 History of falling: Secondary | ICD-10-CM | POA: Diagnosis not present

## 2023-04-06 DIAGNOSIS — R1312 Dysphagia, oropharyngeal phase: Secondary | ICD-10-CM | POA: Diagnosis not present

## 2023-04-06 DIAGNOSIS — R2689 Other abnormalities of gait and mobility: Secondary | ICD-10-CM | POA: Diagnosis not present

## 2023-04-06 DIAGNOSIS — R531 Weakness: Secondary | ICD-10-CM | POA: Diagnosis not present

## 2023-04-06 DIAGNOSIS — S066X0D Traumatic subarachnoid hemorrhage without loss of consciousness, subsequent encounter: Secondary | ICD-10-CM | POA: Diagnosis not present

## 2023-04-06 DIAGNOSIS — M6281 Muscle weakness (generalized): Secondary | ICD-10-CM | POA: Diagnosis not present

## 2023-04-06 DIAGNOSIS — R41841 Cognitive communication deficit: Secondary | ICD-10-CM | POA: Diagnosis not present

## 2023-04-06 DIAGNOSIS — Z9181 History of falling: Secondary | ICD-10-CM | POA: Diagnosis not present

## 2023-04-07 DIAGNOSIS — R1312 Dysphagia, oropharyngeal phase: Secondary | ICD-10-CM | POA: Diagnosis not present

## 2023-04-07 DIAGNOSIS — M6281 Muscle weakness (generalized): Secondary | ICD-10-CM | POA: Diagnosis not present

## 2023-04-07 DIAGNOSIS — S066X0D Traumatic subarachnoid hemorrhage without loss of consciousness, subsequent encounter: Secondary | ICD-10-CM | POA: Diagnosis not present

## 2023-04-07 DIAGNOSIS — Z9181 History of falling: Secondary | ICD-10-CM | POA: Diagnosis not present

## 2023-04-07 DIAGNOSIS — R531 Weakness: Secondary | ICD-10-CM | POA: Diagnosis not present

## 2023-04-07 DIAGNOSIS — R2689 Other abnormalities of gait and mobility: Secondary | ICD-10-CM | POA: Diagnosis not present

## 2023-04-07 DIAGNOSIS — R41841 Cognitive communication deficit: Secondary | ICD-10-CM | POA: Diagnosis not present

## 2023-04-08 DIAGNOSIS — R2689 Other abnormalities of gait and mobility: Secondary | ICD-10-CM | POA: Diagnosis not present

## 2023-04-08 DIAGNOSIS — R531 Weakness: Secondary | ICD-10-CM | POA: Diagnosis not present

## 2023-04-08 DIAGNOSIS — R41841 Cognitive communication deficit: Secondary | ICD-10-CM | POA: Diagnosis not present

## 2023-04-08 DIAGNOSIS — S066X0D Traumatic subarachnoid hemorrhage without loss of consciousness, subsequent encounter: Secondary | ICD-10-CM | POA: Diagnosis not present

## 2023-04-08 DIAGNOSIS — M6281 Muscle weakness (generalized): Secondary | ICD-10-CM | POA: Diagnosis not present

## 2023-04-08 DIAGNOSIS — Z9181 History of falling: Secondary | ICD-10-CM | POA: Diagnosis not present

## 2023-04-08 DIAGNOSIS — R1312 Dysphagia, oropharyngeal phase: Secondary | ICD-10-CM | POA: Diagnosis not present

## 2023-04-09 DIAGNOSIS — R1312 Dysphagia, oropharyngeal phase: Secondary | ICD-10-CM | POA: Diagnosis not present

## 2023-04-09 DIAGNOSIS — Z9181 History of falling: Secondary | ICD-10-CM | POA: Diagnosis not present

## 2023-04-09 DIAGNOSIS — R2689 Other abnormalities of gait and mobility: Secondary | ICD-10-CM | POA: Diagnosis not present

## 2023-04-09 DIAGNOSIS — R41841 Cognitive communication deficit: Secondary | ICD-10-CM | POA: Diagnosis not present

## 2023-04-09 DIAGNOSIS — M6281 Muscle weakness (generalized): Secondary | ICD-10-CM | POA: Diagnosis not present

## 2023-04-09 DIAGNOSIS — S066X0D Traumatic subarachnoid hemorrhage without loss of consciousness, subsequent encounter: Secondary | ICD-10-CM | POA: Diagnosis not present

## 2023-04-10 DIAGNOSIS — R2689 Other abnormalities of gait and mobility: Secondary | ICD-10-CM | POA: Diagnosis not present

## 2023-04-10 DIAGNOSIS — S066X0D Traumatic subarachnoid hemorrhage without loss of consciousness, subsequent encounter: Secondary | ICD-10-CM | POA: Diagnosis not present

## 2023-04-10 DIAGNOSIS — R41841 Cognitive communication deficit: Secondary | ICD-10-CM | POA: Diagnosis not present

## 2023-04-10 DIAGNOSIS — Z9181 History of falling: Secondary | ICD-10-CM | POA: Diagnosis not present

## 2023-04-10 DIAGNOSIS — M6281 Muscle weakness (generalized): Secondary | ICD-10-CM | POA: Diagnosis not present

## 2023-04-10 DIAGNOSIS — R1312 Dysphagia, oropharyngeal phase: Secondary | ICD-10-CM | POA: Diagnosis not present

## 2023-04-11 ENCOUNTER — Telehealth (HOSPITAL_COMMUNITY): Payer: Self-pay

## 2023-04-11 DIAGNOSIS — R338 Other retention of urine: Secondary | ICD-10-CM | POA: Diagnosis not present

## 2023-04-11 DIAGNOSIS — Z9181 History of falling: Secondary | ICD-10-CM | POA: Diagnosis not present

## 2023-04-11 DIAGNOSIS — R41841 Cognitive communication deficit: Secondary | ICD-10-CM | POA: Diagnosis not present

## 2023-04-11 DIAGNOSIS — S066X0D Traumatic subarachnoid hemorrhage without loss of consciousness, subsequent encounter: Secondary | ICD-10-CM | POA: Diagnosis not present

## 2023-04-11 DIAGNOSIS — R1312 Dysphagia, oropharyngeal phase: Secondary | ICD-10-CM | POA: Diagnosis not present

## 2023-04-11 DIAGNOSIS — M6281 Muscle weakness (generalized): Secondary | ICD-10-CM | POA: Diagnosis not present

## 2023-04-11 DIAGNOSIS — R2689 Other abnormalities of gait and mobility: Secondary | ICD-10-CM | POA: Diagnosis not present

## 2023-04-11 NOTE — Telephone Encounter (Signed)
-----   Message from Gilmer Mor sent at 03/22/2023 12:20 PM EST ----- Regarding: RE: peg replacement I spoke with the Dr. On record here, Dr. [place] Mons.  She is not taking care of the patient.  He was in her care briefly during rehab.  It seems this was auto-assigned to her from the current location.   I would reach out to the site where this patient now lives, and ask them to have the physician taking care of the patient currently to determine whether the Gtube is needed or not (GI consult, swallow study, etc) and that doctor can reach out to [place] if needed.   Until then, we will defer this request.   Dr. [place] Mons is aware and agrees.   Thank you [address] ----- Message ----- From: Sharee Pimple Sent: 03/22/2023   9:50 AM EST To: Ir Procedure Requests Subject: FW: peg replacement                             ----- Message ----- From: Sharee Pimple Sent: 03/16/2023  11:19 AM EST To: Ir Procedure Requests Subject: peg replacement                                Procedure: Gastrostomy tube replacement (pt's tube fell out completely on 10/25. Nurse tried to replace but hole was completely closed) Pt has been eating fine but wife is requesting that the tube get put back in)  Dx: Hx of stroke, failure to thrive  Imaging: in epic  Ordering: Dr. Blenda [facility]) [phone]  Please review.   Thanks,  Fara Boros

## 2023-04-12 DIAGNOSIS — R1312 Dysphagia, oropharyngeal phase: Secondary | ICD-10-CM | POA: Diagnosis not present

## 2023-04-12 DIAGNOSIS — M6281 Muscle weakness (generalized): Secondary | ICD-10-CM | POA: Diagnosis not present

## 2023-04-12 DIAGNOSIS — Z9181 History of falling: Secondary | ICD-10-CM | POA: Diagnosis not present

## 2023-04-12 DIAGNOSIS — S066X0D Traumatic subarachnoid hemorrhage without loss of consciousness, subsequent encounter: Secondary | ICD-10-CM | POA: Diagnosis not present

## 2023-04-12 DIAGNOSIS — R2689 Other abnormalities of gait and mobility: Secondary | ICD-10-CM | POA: Diagnosis not present

## 2023-04-12 DIAGNOSIS — R41841 Cognitive communication deficit: Secondary | ICD-10-CM | POA: Diagnosis not present

## 2023-04-13 DIAGNOSIS — M6281 Muscle weakness (generalized): Secondary | ICD-10-CM | POA: Diagnosis not present

## 2023-04-13 DIAGNOSIS — R1312 Dysphagia, oropharyngeal phase: Secondary | ICD-10-CM | POA: Diagnosis not present

## 2023-04-13 DIAGNOSIS — R2689 Other abnormalities of gait and mobility: Secondary | ICD-10-CM | POA: Diagnosis not present

## 2023-04-13 DIAGNOSIS — R41841 Cognitive communication deficit: Secondary | ICD-10-CM | POA: Diagnosis not present

## 2023-04-13 DIAGNOSIS — S066X0D Traumatic subarachnoid hemorrhage without loss of consciousness, subsequent encounter: Secondary | ICD-10-CM | POA: Diagnosis not present

## 2023-04-13 DIAGNOSIS — Z9181 History of falling: Secondary | ICD-10-CM | POA: Diagnosis not present

## 2023-04-14 DIAGNOSIS — R2689 Other abnormalities of gait and mobility: Secondary | ICD-10-CM | POA: Diagnosis not present

## 2023-04-14 DIAGNOSIS — M6281 Muscle weakness (generalized): Secondary | ICD-10-CM | POA: Diagnosis not present

## 2023-04-14 DIAGNOSIS — R1312 Dysphagia, oropharyngeal phase: Secondary | ICD-10-CM | POA: Diagnosis not present

## 2023-04-14 DIAGNOSIS — Z9181 History of falling: Secondary | ICD-10-CM | POA: Diagnosis not present

## 2023-04-14 DIAGNOSIS — R41841 Cognitive communication deficit: Secondary | ICD-10-CM | POA: Diagnosis not present

## 2023-04-14 DIAGNOSIS — S066X0D Traumatic subarachnoid hemorrhage without loss of consciousness, subsequent encounter: Secondary | ICD-10-CM | POA: Diagnosis not present

## 2023-04-17 DIAGNOSIS — R2689 Other abnormalities of gait and mobility: Secondary | ICD-10-CM | POA: Diagnosis not present

## 2023-04-17 DIAGNOSIS — S066X0D Traumatic subarachnoid hemorrhage without loss of consciousness, subsequent encounter: Secondary | ICD-10-CM | POA: Diagnosis not present

## 2023-04-17 DIAGNOSIS — Z9181 History of falling: Secondary | ICD-10-CM | POA: Diagnosis not present

## 2023-04-17 DIAGNOSIS — R1312 Dysphagia, oropharyngeal phase: Secondary | ICD-10-CM | POA: Diagnosis not present

## 2023-04-17 DIAGNOSIS — M6281 Muscle weakness (generalized): Secondary | ICD-10-CM | POA: Diagnosis not present

## 2023-04-17 DIAGNOSIS — R41841 Cognitive communication deficit: Secondary | ICD-10-CM | POA: Diagnosis not present

## 2023-04-20 DIAGNOSIS — I509 Heart failure, unspecified: Secondary | ICD-10-CM | POA: Diagnosis not present

## 2023-04-20 DIAGNOSIS — J449 Chronic obstructive pulmonary disease, unspecified: Secondary | ICD-10-CM | POA: Diagnosis not present

## 2023-04-20 DIAGNOSIS — E039 Hypothyroidism, unspecified: Secondary | ICD-10-CM | POA: Diagnosis not present

## 2023-04-20 DIAGNOSIS — E785 Hyperlipidemia, unspecified: Secondary | ICD-10-CM | POA: Diagnosis not present

## 2023-04-24 DIAGNOSIS — R1312 Dysphagia, oropharyngeal phase: Secondary | ICD-10-CM | POA: Diagnosis not present

## 2023-04-24 DIAGNOSIS — R2689 Other abnormalities of gait and mobility: Secondary | ICD-10-CM | POA: Diagnosis not present

## 2023-04-24 DIAGNOSIS — S066X0D Traumatic subarachnoid hemorrhage without loss of consciousness, subsequent encounter: Secondary | ICD-10-CM | POA: Diagnosis not present

## 2023-04-24 DIAGNOSIS — R41841 Cognitive communication deficit: Secondary | ICD-10-CM | POA: Diagnosis not present

## 2023-04-24 DIAGNOSIS — M6281 Muscle weakness (generalized): Secondary | ICD-10-CM | POA: Diagnosis not present

## 2023-04-24 DIAGNOSIS — Z9181 History of falling: Secondary | ICD-10-CM | POA: Diagnosis not present

## 2023-05-09 ENCOUNTER — Other Ambulatory Visit: Payer: Self-pay

## 2023-05-09 ENCOUNTER — Inpatient Hospital Stay (HOSPITAL_COMMUNITY)
Admission: EM | Admit: 2023-05-09 | Discharge: 2023-05-18 | DRG: 871 | Disposition: A | Discharge status: Hospice | Payer: Medicare HMO | Source: Skilled Nursing Facility | Attending: Internal Medicine | Admitting: Internal Medicine

## 2023-05-09 ENCOUNTER — Emergency Department (HOSPITAL_COMMUNITY): Payer: Medicare HMO

## 2023-05-09 DIAGNOSIS — L8990 Pressure ulcer of unspecified site, unspecified stage: Secondary | ICD-10-CM | POA: Insufficient documentation

## 2023-05-09 DIAGNOSIS — Y846 Urinary catheterization as the cause of abnormal reaction of the patient, or of later complication, without mention of misadventure at the time of the procedure: Secondary | ICD-10-CM | POA: Diagnosis present

## 2023-05-09 DIAGNOSIS — Z515 Encounter for palliative care: Secondary | ICD-10-CM | POA: Diagnosis not present

## 2023-05-09 DIAGNOSIS — R0689 Other abnormalities of breathing: Secondary | ICD-10-CM | POA: Diagnosis not present

## 2023-05-09 DIAGNOSIS — N179 Acute kidney failure, unspecified: Secondary | ICD-10-CM | POA: Diagnosis present

## 2023-05-09 DIAGNOSIS — Z79899 Other long term (current) drug therapy: Secondary | ICD-10-CM | POA: Diagnosis not present

## 2023-05-09 DIAGNOSIS — U071 COVID-19: Secondary | ICD-10-CM | POA: Diagnosis present

## 2023-05-09 DIAGNOSIS — L89626 Pressure-induced deep tissue damage of left heel: Secondary | ICD-10-CM | POA: Diagnosis present

## 2023-05-09 DIAGNOSIS — R1310 Dysphagia, unspecified: Secondary | ICD-10-CM | POA: Diagnosis present

## 2023-05-09 DIAGNOSIS — I5042 Chronic combined systolic (congestive) and diastolic (congestive) heart failure: Secondary | ICD-10-CM | POA: Diagnosis present

## 2023-05-09 DIAGNOSIS — R5383 Other fatigue: Secondary | ICD-10-CM | POA: Diagnosis present

## 2023-05-09 DIAGNOSIS — Z7189 Other specified counseling: Secondary | ICD-10-CM

## 2023-05-09 DIAGNOSIS — F918 Other conduct disorders: Secondary | ICD-10-CM | POA: Diagnosis not present

## 2023-05-09 DIAGNOSIS — J1282 Pneumonia due to coronavirus disease 2019: Secondary | ICD-10-CM | POA: Diagnosis present

## 2023-05-09 DIAGNOSIS — R7989 Other specified abnormal findings of blood chemistry: Secondary | ICD-10-CM

## 2023-05-09 DIAGNOSIS — E872 Acidosis, unspecified: Secondary | ICD-10-CM | POA: Diagnosis not present

## 2023-05-09 DIAGNOSIS — L89216 Pressure-induced deep tissue damage of right hip: Secondary | ICD-10-CM | POA: Diagnosis present

## 2023-05-09 DIAGNOSIS — I110 Hypertensive heart disease with heart failure: Secondary | ICD-10-CM | POA: Diagnosis present

## 2023-05-09 DIAGNOSIS — J690 Pneumonitis due to inhalation of food and vomit: Secondary | ICD-10-CM

## 2023-05-09 DIAGNOSIS — R64 Cachexia: Secondary | ICD-10-CM | POA: Diagnosis present

## 2023-05-09 DIAGNOSIS — R6521 Severe sepsis with septic shock: Secondary | ICD-10-CM | POA: Diagnosis not present

## 2023-05-09 DIAGNOSIS — J22 Unspecified acute lower respiratory infection: Secondary | ICD-10-CM

## 2023-05-09 DIAGNOSIS — T83511A Infection and inflammatory reaction due to indwelling urethral catheter, initial encounter: Secondary | ICD-10-CM | POA: Diagnosis present

## 2023-05-09 DIAGNOSIS — Z743 Need for continuous supervision: Secondary | ICD-10-CM | POA: Diagnosis not present

## 2023-05-09 DIAGNOSIS — Z789 Other specified health status: Secondary | ICD-10-CM

## 2023-05-09 DIAGNOSIS — A4189 Other specified sepsis: Secondary | ICD-10-CM | POA: Diagnosis not present

## 2023-05-09 DIAGNOSIS — J440 Chronic obstructive pulmonary disease with acute lower respiratory infection: Secondary | ICD-10-CM | POA: Diagnosis present

## 2023-05-09 DIAGNOSIS — I2510 Atherosclerotic heart disease of native coronary artery without angina pectoris: Secondary | ICD-10-CM | POA: Diagnosis present

## 2023-05-09 DIAGNOSIS — J9601 Acute respiratory failure with hypoxia: Secondary | ICD-10-CM | POA: Diagnosis not present

## 2023-05-09 DIAGNOSIS — L89616 Pressure-induced deep tissue damage of right heel: Secondary | ICD-10-CM | POA: Diagnosis present

## 2023-05-09 DIAGNOSIS — A419 Sepsis, unspecified organism: Secondary | ICD-10-CM | POA: Diagnosis present

## 2023-05-09 DIAGNOSIS — R4589 Other symptoms and signs involving emotional state: Secondary | ICD-10-CM | POA: Diagnosis not present

## 2023-05-09 DIAGNOSIS — J4489 Other specified chronic obstructive pulmonary disease: Secondary | ICD-10-CM | POA: Diagnosis present

## 2023-05-09 DIAGNOSIS — L89892 Pressure ulcer of other site, stage 2: Secondary | ICD-10-CM | POA: Diagnosis present

## 2023-05-09 DIAGNOSIS — G9341 Metabolic encephalopathy: Secondary | ICD-10-CM | POA: Diagnosis not present

## 2023-05-09 DIAGNOSIS — Z681 Body mass index (BMI) 19 or less, adult: Secondary | ICD-10-CM | POA: Diagnosis not present

## 2023-05-09 DIAGNOSIS — E039 Hypothyroidism, unspecified: Secondary | ICD-10-CM | POA: Diagnosis present

## 2023-05-09 DIAGNOSIS — R338 Other retention of urine: Secondary | ICD-10-CM | POA: Diagnosis present

## 2023-05-09 DIAGNOSIS — Z923 Personal history of irradiation: Secondary | ICD-10-CM

## 2023-05-09 DIAGNOSIS — Z79890 Hormone replacement therapy (postmenopausal): Secondary | ICD-10-CM

## 2023-05-09 DIAGNOSIS — Z66 Do not resuscitate: Secondary | ICD-10-CM | POA: Diagnosis present

## 2023-05-09 DIAGNOSIS — F0394 Unspecified dementia, unspecified severity, with anxiety: Secondary | ICD-10-CM | POA: Diagnosis present

## 2023-05-09 DIAGNOSIS — R6520 Severe sepsis without septic shock: Secondary | ICD-10-CM | POA: Diagnosis not present

## 2023-05-09 DIAGNOSIS — Z955 Presence of coronary angioplasty implant and graft: Secondary | ICD-10-CM

## 2023-05-09 DIAGNOSIS — E43 Unspecified severe protein-calorie malnutrition: Secondary | ICD-10-CM | POA: Diagnosis present

## 2023-05-09 DIAGNOSIS — D508 Other iron deficiency anemias: Secondary | ICD-10-CM | POA: Diagnosis present

## 2023-05-09 DIAGNOSIS — R627 Adult failure to thrive: Secondary | ICD-10-CM | POA: Diagnosis not present

## 2023-05-09 DIAGNOSIS — L89226 Pressure-induced deep tissue damage of left hip: Secondary | ICD-10-CM | POA: Diagnosis present

## 2023-05-09 DIAGNOSIS — F0393 Unspecified dementia, unspecified severity, with mood disturbance: Secondary | ICD-10-CM | POA: Diagnosis present

## 2023-05-09 DIAGNOSIS — R296 Repeated falls: Secondary | ICD-10-CM | POA: Diagnosis present

## 2023-05-09 DIAGNOSIS — R231 Pallor: Secondary | ICD-10-CM | POA: Diagnosis not present

## 2023-05-09 DIAGNOSIS — H908 Mixed conductive and sensorineural hearing loss, unspecified: Secondary | ICD-10-CM | POA: Diagnosis present

## 2023-05-09 DIAGNOSIS — I252 Old myocardial infarction: Secondary | ICD-10-CM

## 2023-05-09 DIAGNOSIS — E860 Dehydration: Secondary | ICD-10-CM | POA: Diagnosis present

## 2023-05-09 DIAGNOSIS — I255 Ischemic cardiomyopathy: Secondary | ICD-10-CM | POA: Diagnosis present

## 2023-05-09 DIAGNOSIS — Z22322 Carrier or suspected carrier of Methicillin resistant Staphylococcus aureus: Secondary | ICD-10-CM

## 2023-05-09 DIAGNOSIS — E870 Hyperosmolality and hypernatremia: Secondary | ICD-10-CM | POA: Diagnosis present

## 2023-05-09 DIAGNOSIS — F32A Depression, unspecified: Secondary | ICD-10-CM | POA: Diagnosis present

## 2023-05-09 DIAGNOSIS — R531 Weakness: Secondary | ICD-10-CM | POA: Diagnosis not present

## 2023-05-09 DIAGNOSIS — N390 Urinary tract infection, site not specified: Secondary | ICD-10-CM | POA: Diagnosis present

## 2023-05-09 DIAGNOSIS — R4182 Altered mental status, unspecified: Secondary | ICD-10-CM | POA: Diagnosis not present

## 2023-05-09 DIAGNOSIS — J069 Acute upper respiratory infection, unspecified: Secondary | ICD-10-CM | POA: Diagnosis not present

## 2023-05-09 DIAGNOSIS — R509 Fever, unspecified: Principal | ICD-10-CM

## 2023-05-09 DIAGNOSIS — D638 Anemia in other chronic diseases classified elsewhere: Secondary | ICD-10-CM | POA: Diagnosis not present

## 2023-05-09 DIAGNOSIS — E782 Mixed hyperlipidemia: Secondary | ICD-10-CM | POA: Diagnosis present

## 2023-05-09 DIAGNOSIS — B965 Pseudomonas (aeruginosa) (mallei) (pseudomallei) as the cause of diseases classified elsewhere: Secondary | ICD-10-CM | POA: Diagnosis present

## 2023-05-09 DIAGNOSIS — N171 Acute kidney failure with acute cortical necrosis: Secondary | ICD-10-CM | POA: Diagnosis not present

## 2023-05-09 DIAGNOSIS — E8809 Other disorders of plasma-protein metabolism, not elsewhere classified: Secondary | ICD-10-CM | POA: Diagnosis present

## 2023-05-09 DIAGNOSIS — N401 Enlarged prostate with lower urinary tract symptoms: Secondary | ICD-10-CM | POA: Diagnosis present

## 2023-05-09 DIAGNOSIS — L89156 Pressure-induced deep tissue damage of sacral region: Secondary | ICD-10-CM | POA: Diagnosis present

## 2023-05-09 DIAGNOSIS — R404 Transient alteration of awareness: Secondary | ICD-10-CM | POA: Diagnosis not present

## 2023-05-09 DIAGNOSIS — R918 Other nonspecific abnormal finding of lung field: Secondary | ICD-10-CM | POA: Diagnosis not present

## 2023-05-09 DIAGNOSIS — Z87820 Personal history of traumatic brain injury: Secondary | ICD-10-CM

## 2023-05-09 DIAGNOSIS — I480 Paroxysmal atrial fibrillation: Secondary | ICD-10-CM | POA: Diagnosis present

## 2023-05-09 DIAGNOSIS — M810 Age-related osteoporosis without current pathological fracture: Secondary | ICD-10-CM | POA: Diagnosis present

## 2023-05-09 DIAGNOSIS — Z8546 Personal history of malignant neoplasm of prostate: Secondary | ICD-10-CM

## 2023-05-09 DIAGNOSIS — Z87891 Personal history of nicotine dependence: Secondary | ICD-10-CM

## 2023-05-09 DIAGNOSIS — K219 Gastro-esophageal reflux disease without esophagitis: Secondary | ICD-10-CM | POA: Diagnosis present

## 2023-05-09 DIAGNOSIS — B9620 Unspecified Escherichia coli [E. coli] as the cause of diseases classified elsewhere: Secondary | ICD-10-CM | POA: Diagnosis present

## 2023-05-09 DIAGNOSIS — E876 Hypokalemia: Secondary | ICD-10-CM | POA: Diagnosis present

## 2023-05-09 DIAGNOSIS — H906 Mixed conductive and sensorineural hearing loss, bilateral: Secondary | ICD-10-CM | POA: Diagnosis present

## 2023-05-09 LAB — URINALYSIS, W/ REFLEX TO CULTURE (INFECTION SUSPECTED)
Bilirubin Urine: NEGATIVE
Glucose, UA: NEGATIVE mg/dL
Ketones, ur: NEGATIVE mg/dL
Nitrite: NEGATIVE
Protein, ur: 100 mg/dL — AB
Specific Gravity, Urine: 1.021 (ref 1.005–1.030)
WBC, UA: >50 WBC/hpf (ref 0–5)
pH: 5 (ref 5.0–8.0)

## 2023-05-09 LAB — BLOOD GAS, ARTERIAL
Acid-Base Excess: 1.8 mmol/L (ref 0.0–2.0)
Bicarbonate: 24.7 mmol/L (ref 20.0–28.0)
Drawn by: 22563
O2 Content: 3 L/min
O2 Saturation: 100 %
Patient temperature: 36.8
RATE: 32 {breaths}/min
pCO2 arterial: 31 mm[Hg] — ABNORMAL LOW (ref 32–48)
pH, Arterial: 7.51 — ABNORMAL HIGH (ref 7.35–7.45)
pO2, Arterial: 91 mm[Hg] (ref 83–108)

## 2023-05-09 LAB — CBC WITH DIFFERENTIAL/PLATELET
Abs Immature Granulocytes: 0.18 10*3/uL — ABNORMAL HIGH (ref 0.00–0.07)
Basophils Absolute: 0.1 10*3/uL (ref 0.0–0.1)
Basophils Relative: 0 %
Eosinophils Absolute: 0.1 10*3/uL (ref 0.0–0.5)
Eosinophils Relative: 0 %
HCT: 40.6 % (ref 39.0–52.0)
Hemoglobin: 11.6 g/dL — ABNORMAL LOW (ref 13.0–17.0)
Immature Granulocytes: 1 %
Lymphocytes Relative: 2 %
Lymphs Abs: 0.6 10*3/uL — ABNORMAL LOW (ref 0.7–4.0)
MCH: 27.2 pg (ref 26.0–34.0)
MCHC: 28.6 g/dL — ABNORMAL LOW (ref 30.0–36.0)
MCV: 95.1 fL (ref 80.0–100.0)
Monocytes Absolute: 0.6 10*3/uL (ref 0.1–1.0)
Monocytes Relative: 3 %
Neutro Abs: 23.1 10*3/uL — ABNORMAL HIGH (ref 1.7–7.7)
Neutrophils Relative %: 94 %
Platelets: 291 10*3/uL (ref 150–400)
RBC: 4.27 MIL/uL (ref 4.22–5.81)
RDW: 16 % — ABNORMAL HIGH (ref 11.5–15.5)
WBC: 24.5 10*3/uL — ABNORMAL HIGH (ref 4.0–10.5)
nRBC: 0 % (ref 0.0–0.2)

## 2023-05-09 LAB — COMPREHENSIVE METABOLIC PANEL
ALT: 23 U/L (ref 0–44)
AST: 28 U/L (ref 15–41)
Albumin: 2.2 g/dL — ABNORMAL LOW (ref 3.5–5.0)
Alkaline Phosphatase: 65 U/L (ref 38–126)
Anion gap: 7 (ref 5–15)
BUN: 43 mg/dL — ABNORMAL HIGH (ref 8–23)
CO2: 24 mmol/L (ref 22–32)
Calcium: 8.7 mg/dL — ABNORMAL LOW (ref 8.9–10.3)
Chloride: 128 mmol/L — ABNORMAL HIGH (ref 98–111)
Creatinine, Ser: 1.37 mg/dL — ABNORMAL HIGH (ref 0.61–1.24)
GFR, Estimated: 49 mL/min — ABNORMAL LOW (ref >60)
Glucose, Bld: 134 mg/dL — ABNORMAL HIGH (ref 70–99)
Potassium: 3.4 mmol/L — ABNORMAL LOW (ref 3.5–5.1)
Sodium: 159 mmol/L — ABNORMAL HIGH (ref 135–145)
Total Bilirubin: 0.8 mg/dL (ref 0.0–1.2)
Total Protein: 6.8 g/dL (ref 6.5–8.1)

## 2023-05-09 LAB — RESP PANEL BY RT-PCR (RSV, FLU A&B, COVID)  RVPGX2
Influenza A by PCR: NEGATIVE
Influenza B by PCR: NEGATIVE
Resp Syncytial Virus by PCR: NEGATIVE
SARS Coronavirus 2 by RT PCR: POSITIVE — AB

## 2023-05-09 LAB — CBG MONITORING, ED: Glucose-Capillary: 107 mg/dL — ABNORMAL HIGH (ref 70–99)

## 2023-05-09 LAB — BRAIN NATRIURETIC PEPTIDE: B Natriuretic Peptide: 131 pg/mL — ABNORMAL HIGH (ref 0.0–100.0)

## 2023-05-09 LAB — I-STAT CG4 LACTIC ACID, ED
Lactic Acid, Venous: 4.6 mmol/L (ref 0.5–1.9)
Lactic Acid, Venous: 6.2 mmol/L (ref 0.5–1.9)

## 2023-05-09 LAB — TROPONIN I (HIGH SENSITIVITY): Troponin I (High Sensitivity): 45 ng/L — ABNORMAL HIGH (ref <18)

## 2023-05-09 MED ORDER — LACTATED RINGERS IV BOLUS (SEPSIS)
500.0000 mL | Freq: Once | INTRAVENOUS | Status: AC
  Administered 2023-05-09: 500 mL via INTRAVENOUS

## 2023-05-09 MED ORDER — DEXAMETHASONE SODIUM PHOSPHATE 10 MG/ML IJ SOLN
6.0000 mg | INTRAMUSCULAR | Status: DC
  Administered 2023-05-10 – 2023-05-11 (×2): 6 mg via INTRAVENOUS
  Filled 2023-05-09 (×2): qty 1

## 2023-05-09 MED ORDER — INSULIN ASPART 100 UNIT/ML IJ SOLN
1.0000 [IU] | INTRAMUSCULAR | Status: DC
  Administered 2023-05-10 – 2023-05-11 (×7): 1 [IU] via SUBCUTANEOUS
  Filled 2023-05-09: qty 0.03

## 2023-05-09 MED ORDER — ALBUTEROL SULFATE (2.5 MG/3ML) 0.083% IN NEBU
5.0000 mg | INHALATION_SOLUTION | Freq: Once | RESPIRATORY_TRACT | Status: AC
  Administered 2023-05-09: 5 mg via RESPIRATORY_TRACT
  Filled 2023-05-09: qty 6

## 2023-05-09 MED ORDER — SODIUM CHLORIDE 0.9 % IV SOLN: 200.0000 mg | Freq: Once | INTRAVENOUS | Status: DC

## 2023-05-09 MED ORDER — ACETAMINOPHEN 325 MG PO TABS: 650.0000 mg | ORAL_TABLET | ORAL | Status: DC | PRN

## 2023-05-09 MED ORDER — SODIUM CHLORIDE 0.9 % IV SOLN
100.0000 mg | Freq: Every day | INTRAVENOUS | Status: AC
  Administered 2023-05-10 – 2023-05-11 (×2): 100 mg via INTRAVENOUS
  Filled 2023-05-09 (×2): qty 20

## 2023-05-09 MED ORDER — LACTATED RINGERS IV SOLN: INTRAVENOUS | Status: AC

## 2023-05-09 MED ORDER — SODIUM CHLORIDE 0.9 % IV SOLN
2.0000 g | Freq: Once | INTRAVENOUS | Status: AC
  Administered 2023-05-09: 2 g via INTRAVENOUS
  Filled 2023-05-09: qty 20

## 2023-05-09 MED ORDER — SODIUM CHLORIDE 0.9 % IV SOLN
100.0000 mg | Freq: Once | INTRAVENOUS | Status: AC
  Administered 2023-05-10: 100 mg via INTRAVENOUS
  Filled 2023-05-09: qty 20

## 2023-05-09 MED ORDER — DEXAMETHASONE SODIUM PHOSPHATE 10 MG/ML IJ SOLN
10.0000 mg | Freq: Once | INTRAMUSCULAR | Status: AC
  Administered 2023-05-09: 10 mg via INTRAVENOUS
  Filled 2023-05-09: qty 1

## 2023-05-09 MED ORDER — IPRATROPIUM-ALBUTEROL 0.5-2.5 (3) MG/3ML IN SOLN: 3.0000 mL | RESPIRATORY_TRACT | Status: DC | PRN

## 2023-05-09 MED ORDER — SODIUM CHLORIDE 0.9 % IV SOLN: 100.0000 mg | Freq: Every day | INTRAVENOUS | Status: DC

## 2023-05-09 MED ORDER — SODIUM CHLORIDE 0.9 % IV SOLN
500.0000 mg | Freq: Once | INTRAVENOUS | Status: AC
Start: 2023-05-09 — End: 2023-05-09
  Administered 2023-05-09: 500 mg via INTRAVENOUS
  Filled 2023-05-09: qty 5

## 2023-05-09 MED ORDER — FAMOTIDINE 20 MG PO TABS: 20.0000 mg | ORAL_TABLET | Freq: Every day | ORAL | Status: DC

## 2023-05-09 MED ORDER — POTASSIUM CHLORIDE 10 MEQ/100ML IV SOLN
10.0000 meq | INTRAVENOUS | Status: AC
  Administered 2023-05-09 – 2023-05-10 (×4): 10 meq via INTRAVENOUS
  Filled 2023-05-09 (×2): qty 100

## 2023-05-09 MED ORDER — POLYETHYLENE GLYCOL 3350 17 G PO PACK
17.0000 g | PACK | Freq: Every day | ORAL | Status: DC
  Filled 2023-05-09: qty 1

## 2023-05-09 MED ORDER — HEPARIN SODIUM (PORCINE) 5000 UNIT/ML IJ SOLN
5000.0000 [IU] | Freq: Three times a day (TID) | INTRAMUSCULAR | Status: DC
  Administered 2023-05-09 – 2023-05-11 (×4): 5000 [IU] via SUBCUTANEOUS
  Filled 2023-05-09 (×4): qty 1

## 2023-05-09 MED ORDER — LACTATED RINGERS IV BOLUS
1000.0000 mL | Freq: Once | INTRAVENOUS | Status: AC
Start: 2023-05-09 — End: 2023-05-09
  Administered 2023-05-09: 1000 mL via INTRAVENOUS

## 2023-05-09 MED ORDER — LEVOTHYROXINE SODIUM 75 MCG PO TABS: 175.0000 ug | ORAL_TABLET | Freq: Every day | ORAL | Status: DC

## 2023-05-09 MED ORDER — ADULT MULTIVITAMIN W/MINERALS CH: 1.0000 | ORAL_TABLET | Freq: Every day | ORAL | Status: DC

## 2023-05-09 MED ORDER — FAMOTIDINE 20 MG PO TABS: 40.0000 mg | ORAL_TABLET | Freq: Every morning | ORAL | Status: DC

## 2023-05-09 MED ORDER — SODIUM CHLORIDE 0.9 % IV SOLN
500.0000 mg | INTRAVENOUS | Status: AC
  Administered 2023-05-10 – 2023-05-11 (×2): 500 mg via INTRAVENOUS
  Filled 2023-05-09 (×2): qty 5

## 2023-05-09 MED ORDER — DOCUSATE SODIUM 100 MG PO CAPS
100.0000 mg | ORAL_CAPSULE | Freq: Two times a day (BID) | ORAL | Status: DC
  Filled 2023-05-09: qty 1

## 2023-05-09 MED ORDER — VANCOMYCIN HCL 1250 MG/250ML IV SOLN
750.0000 mg | Freq: Once | INTRAVENOUS | Status: AC
  Administered 2023-05-09: 750 mg via INTRAVENOUS
  Filled 2023-05-09: qty 150

## 2023-05-09 MED ORDER — LACTATED RINGERS IV BOLUS
1500.0000 mL | Freq: Once | INTRAVENOUS | Status: AC
  Administered 2023-05-09: 1500 mL via INTRAVENOUS

## 2023-05-09 MED ORDER — MAGNESIUM SULFATE 2 GM/50ML IV SOLN
2.0000 g | Freq: Once | INTRAVENOUS | Status: AC
  Administered 2023-05-10: 2 g via INTRAVENOUS
  Filled 2023-05-09: qty 50

## 2023-05-09 MED ORDER — SODIUM CHLORIDE 0.9 % IV SOLN
1.0000 g | INTRAVENOUS | Status: DC
  Administered 2023-05-10: 1 g via INTRAVENOUS
  Filled 2023-05-09 (×2): qty 10

## 2023-05-09 MED ORDER — IPRATROPIUM BROMIDE 0.02 % IN SOLN
0.5000 mg | Freq: Once | RESPIRATORY_TRACT | Status: AC
  Administered 2023-05-09: 0.5 mg via RESPIRATORY_TRACT
  Filled 2023-05-09: qty 2.5

## 2023-05-09 MED ORDER — ATOMOXETINE HCL 40 MG PO CAPS: 40.0000 mg | ORAL_CAPSULE | Freq: Every day | ORAL | Status: DC

## 2023-05-09 NOTE — ED Triage Notes (Signed)
Patient to ED by EMS from [facility] with possible sepsis. Per EMS facility called out due to patient having AMS and odor to urine. Foley catheter is in place and functional. 104/60 120 CBG:193 20g in L hand

## 2023-05-09 NOTE — Progress Notes (Signed)
 Pharmacy Electrolyte Replacement  Recent Labs:  Recent Labs    05/09/23 1527  K 3.4*  CREATININE 1.37*    Low Critical Values (K </= 2.5, Phos </= 1, Mg </= 1) Present: None  MD Contacted: N/A  Plan:  KCl 10 mEq IV q1h x 4 Recheck BMP with AM labs   Thank you for allowing pharmacy to be a part of this patient's care.  Eleanor EMERSON Agent, PharmD, BCPS Clinical Pharmacist [place] 05/09/2023 9:53 PM

## 2023-05-09 NOTE — Progress Notes (Signed)
 Pharmacy Consult regarding COVID Medication Management  Assessment: - 12/31 Resp panel: positive for COVID - symptom onset unknown - age > 65 years - oxygen needs exceeding baseline - currently on nasal cannula at 3L   Plan: Remdesivir  200 mg IV x 1 followed by 100 mg IV daily x 2 days   Thank you for allowing pharmacy to be a part of this patient's care.  Eleanor EMERSON Agent, PharmD, BCPS Clinical Pharmacist [place] 05/09/2023 10:10 PM

## 2023-05-09 NOTE — ED Notes (Signed)
 ED TO INPATIENT HANDOFF REPORT  Name/Age/Gender [name]. 89 y.o. male  Code Status    Code Status Orders  (From admission, onward)           Start     Ordered   05/09/23 2146  Do not attempt resuscitation (DNR)- Limited -Do Not Intubate (DNI)  Continuous       Question Answer Comment  If pulseless and not breathing No CPR or chest compressions.   In Pre-Arrest Conditions (Patient Is Breathing and Has A Pulse) Do not intubate. Provide all appropriate non-invasive medical interventions. Avoid ICU transfer unless indicated or required.   Consent: Discussion documented in EHR or advanced directives reviewed      05/09/23 2146           Code Status History     Date Active Date Inactive Code Status Order ID Comments User Context   05/09/2023 1829 05/09/2023 2146 Limited: Do not attempt resuscitation (DNR) -DNR-LIMITED -Do Not Intubate/DNI  [id number]  Bernard Drivers, MD ED   12/15/2022 2204 01/19/2023 2016 Full Code [id number]  Delaine Isaiah SAUNDERS, PA-C ED   06/22/2020 0127 06/25/2020 1854 Full Code [id number]  Almetta Donnice LABOR, MD ED       Home/SNF/Other Skilled Nursing Facility  Chief Complaint Sepsis [facility]) [A41.9]  Level of Care/Admitting Diagnosis ED Disposition     ED Disposition  Admit   Condition  --   Comment  Hospital Area: [facility] [[id number]]  Level of Care: ICU [6]  May admit patient to [facility] or [facility] if equivalent level of care is available:: Yes  Covid Evaluation: Confirmed COVID Positive  Diagnosis: Sepsis [facility]) [[id number]]  Admitting Physician: DUB MANCEL HERO [[id number]]  Attending Physician: DUB MANCEL HERO [[id number]]  Certification:: I certify this patient will need inpatient services for at least 2 midnights  Expected Medical Readiness: 05/11/2023          Medical History Past Medical History:  Diagnosis Date   Acquired hypothyroidism 08/15/2019   Acute systolic heart failure (HCC) 06/25/2020    Anemia 02/05/2020   boarderline anemic   Atopic dermatitis 12/05/2019   Bacterial pneumonia, unspecified 06/14/2012   06/14/2012 CXR w/ RML PNA >Zithromax  and Rocephin      Bradycardia 11/28/2018   CAD (coronary artery disease) 07/07/2020   Cancer (HCC)    prostate cancer-seed implant   Closed fracture dislocation of right elbow    Closed fracture of right olecranon process 04/10/2020   COPD (chronic obstructive pulmonary disease) (HCC) 01/21/2020   Dermatitis 02/05/2020   Dizziness 02/19/2016   Essential hypertension 11/28/2018   History of prostate cancer    seed implant   Hyperlipemia    Hyperlipidemia, mixed 08/21/2008   Qualifier: Diagnosis of  By: Neysa MD, Clinton D    LVH (left ventricular hypertrophy) 11/28/2018   Malnutrition of moderate degree (HCC) 08/15/2019   Mixed conductive and sensorineural hearing loss, 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   Moderate persistent asthma, uncomplicated 01/21/2020   Nonsustained ventricular tachycardia (HCC) 01/30/2019   NSTEMI (non-ST elevated myocardial infarction) (HCC) 06/21/2020   Other fatigue 02/05/2020   PAF (paroxysmal atrial fibrillation) (HCC) 06/25/2020   Seasonal and perennial allergic rhinitis 05/17/2007       Senile osteoporosis 08/15/2019   SINUSITIS, ACUTE 06/28/2007   Qualifier: Diagnosis of  By: Orlie NP, Tammy     Ventricular ectopy 11/28/2018    Allergies Allergies  Allergen Reactions   Clarithromycin Other (See Comments)    GI upset- Allergic, per [facility]  IV Location/Drains/Wounds Patient Lines/Drains/Airways Status     Active Line/Drains/Airways     Name Placement date Placement time Site Days   Peripheral IV 05/09/23 20 G Left;Posterior Hand 05/09/23  1508  Hand  less than 1   Gastrostomy/Enterostomy Gastrostomy 20 Fr. LUQ 01/10/23  0921  LUQ  119   Urethral Catheter Ole Bourdon, MD Straight-tip 01/12/23  1100  Straight-tip  117            Labs/Imaging Results for orders placed or performed during the hospital encounter  of 05/09/23 (from the past 48 hours)  Comprehensive metabolic panel     Status: Abnormal   Collection Time: 05/09/23  3:27 PM  Result Value Ref Range   Sodium 159 (H) 135 - 145 mmol/L   Potassium 3.4 (L) 3.5 - 5.1 mmol/L   Chloride 128 (H) 98 - 111 mmol/L   CO2 24 22 - 32 mmol/L   Glucose, Bld 134 (H) 70 - 99 mg/dL    Comment: Glucose reference range applies only to samples taken after fasting for at least 8 hours.   BUN 43 (H) 8 - 23 mg/dL   Creatinine, Ser 8.62 (H) 0.61 - 1.24 mg/dL   Calcium  8.7 (L) 8.9 - 10.3 mg/dL   Total Protein 6.8 6.5 - 8.1 g/dL   Albumin 2.2 (L) 3.5 - 5.0 g/dL   AST 28 15 - 41 U/L   ALT 23 0 - 44 U/L   Alkaline Phosphatase 65 38 - 126 U/L   Total Bilirubin 0.8 0.0 - 1.2 mg/dL   GFR, Estimated 49 (L) >60 mL/min    Comment: (NOTE) Calculated using the CKD-EPI Creatinine Equation (2021)    Anion gap 7 5 - 15    Comment: Performed at [facility], [address], [place], [place] [address]  CBC with Differential     Status: Abnormal   Collection Time: 05/09/23  3:27 PM  Result Value Ref Range   WBC 24.5 (H) 4.0 - 10.5 K/uL   RBC 4.27 4.22 - 5.81 MIL/uL   Hemoglobin 11.6 (L) 13.0 - 17.0 g/dL   HCT 59.3 60.9 - 47.9 %   MCV 95.1 80.0 - 100.0 fL   MCH 27.2 26.0 - 34.0 pg   MCHC 28.6 (L) 30.0 - 36.0 g/dL   RDW 83.9 (H) 88.4 - 84.4 %   Platelets 291 150 - 400 K/uL   nRBC 0.0 0.0 - 0.2 %   Neutrophils Relative % 94 %   Neutro Abs 23.1 (H) 1.7 - 7.7 K/uL   Lymphocytes Relative 2 %   Lymphs Abs 0.6 (L) 0.7 - 4.0 K/uL   Monocytes Relative 3 %   Monocytes Absolute 0.6 0.1 - 1.0 K/uL   Eosinophils Relative 0 %   Eosinophils Absolute 0.1 0.0 - 0.5 K/uL   Basophils Relative 0 %   Basophils Absolute 0.1 0.0 - 0.1 K/uL   Immature Granulocytes 1 %   Abs Immature Granulocytes 0.18 (H) 0.00 - 0.07 K/uL    Comment: Performed at [facility], [address], [place], [place] [address]  Brain natriuretic peptide     Status:  Abnormal   Collection Time: 05/09/23  3:27 PM  Result Value Ref Range   B Natriuretic Peptide 131.0 (H) 0.0 - 100.0 pg/mL    Comment: Performed at [facility], [address] [place], [place] [address]  Troponin I (High Sensitivity)     Status: Abnormal   Collection Time: 05/09/23  3:27 PM  Result Value Ref Range   Troponin I (High Sensitivity) 45 (H) <18 ng/L    Comment: (NOTE) Elevated high sensitivity troponin I (hsTnI) values and significant  changes across serial measurements may suggest ACS but many other  chronic and acute conditions are known to elevate hsTnI results.  Refer to the Links section for chest pain algorithms and additional  guidance. Performed at [facility], [address] [place], [place] [address]   Resp panel by RT-PCR (RSV, Flu A&B, Covid) Anterior Nasal Swab     Status: Abnormal   Collection Time: 05/09/23  4:05 PM   Specimen: Anterior Nasal Swab  Result Value Ref Range   SARS Coronavirus 2 by RT PCR POSITIVE (A) NEGATIVE    Comment: (NOTE) SARS-CoV-2 target nucleic acids are DETECTED.  The SARS-CoV-2 RNA is generally detectable in upper respiratory specimens during the acute phase of infection. Positive results are indicative of the presence of the identified virus, but do not rule out bacterial infection or co-infection with other pathogens not detected by the test. Clinical correlation with patient history and other diagnostic information is necessary to determine patient infection status. The expected result is Negative.  Fact Sheet for Patients: bloggercourse.com  Fact Sheet for Healthcare Providers: seriousbroker.it  This test is not yet approved or cleared by the [place]  FDA and  has been authorized for detection and/or diagnosis of SARS-CoV-2 by FDA under an Emergency Use Authorization (EUA).  This EUA will remain in effect (meaning this test  can be used) for the duration of  the COVID-19 declaration under Section 564(b)(1) of the A ct, 21 U.S.C. section 360bbb-3(b)(1), unless the authorization is terminated or revoked sooner.     Influenza A by PCR NEGATIVE NEGATIVE   Influenza B by PCR NEGATIVE NEGATIVE    Comment: (NOTE) The Xpert Xpress SARS-CoV-2/FLU/RSV plus assay is intended as an aid in the diagnosis of influenza from Nasopharyngeal swab specimens and should not be used as a sole basis for treatment. Nasal washings and aspirates are unacceptable for Xpert Xpress SARS-CoV-2/FLU/RSV testing.  Fact Sheet for Patients: bloggercourse.com  Fact Sheet for Healthcare Providers: seriousbroker.it  This test is not yet approved or cleared by the [place]  FDA and has been authorized for detection and/or diagnosis of SARS-CoV-2 by FDA under an Emergency Use Authorization (EUA). This EUA will remain in effect (meaning this test can be used) for the duration of the COVID-19 declaration under Section 564(b)(1) of the Act, 21 U.S.C. section 360bbb-3(b)(1), unless the authorization is terminated or revoked.     Resp Syncytial Virus by PCR NEGATIVE NEGATIVE    Comment: (NOTE) Fact Sheet for Patients: bloggercourse.com  Fact Sheet for Healthcare Providers: seriousbroker.it  This test is not yet approved or cleared by the [place]  FDA and has been authorized for detection and/or diagnosis of SARS-CoV-2 by FDA under an Emergency Use Authorization (EUA). This EUA will remain in effect (meaning this test can be used) for the duration of the COVID-19 declaration under Section 564(b)(1) of the Act, 21 U.S.C. section 360bbb-3(b)(1), unless the authorization is terminated or revoked.  Performed at [facility], [address] [place], [place] [address]   Urinalysis, w/ Reflex to Culture (Infection  Suspected) -Urine, Catheterized; Indwelling urinary catheter     Status: Abnormal   Collection Time: 05/09/23  4:13 PM  Result Value Ref Range   Specimen Source URINE, CATHETERIZED    Color, Urine YELLOW YELLOW   APPearance CLOUDY (  A) CLEAR   Specific Gravity, Urine 1.021 1.005 - 1.030   pH 5.0 5.0 - 8.0   Glucose, UA NEGATIVE NEGATIVE mg/dL   Hgb urine dipstick MODERATE (A) NEGATIVE   Bilirubin Urine NEGATIVE NEGATIVE   Ketones, ur NEGATIVE NEGATIVE mg/dL   Protein, ur 899 (A) NEGATIVE mg/dL   Nitrite NEGATIVE NEGATIVE   Leukocytes,Ua LARGE (A) NEGATIVE   RBC / HPF 11-20 0 - 5 RBC/hpf   WBC, UA >50 0 - 5 WBC/hpf    Comment:        Reflex urine culture not performed if WBC <=10, OR if Squamous epithelial cells >5. If Squamous epithelial cells >5 suggest recollection.    Bacteria, UA MANY (A) NONE SEEN   Squamous Epithelial / HPF 0-5 0 - 5 /HPF   Mucus PRESENT     Comment: Performed at [facility], [address], [place], [place] [address]  I-Stat Lactic Acid, ED     Status: Abnormal   Collection Time: 05/09/23  4:34 PM  Result Value Ref Range   Lactic Acid, Venous 4.6 (HH) 0.5 - 1.9 mmol/L  I-Stat Lactic Acid, ED     Status: Abnormal   Collection Time: 05/09/23  5:55 PM  Result Value Ref Range   Lactic Acid, Venous 6.2 (HH) 0.5 - 1.9 mmol/L  CBG monitoring, ED     Status: Abnormal   Collection Time: 05/09/23 10:01 PM  Result Value Ref Range   Glucose-Capillary 107 (H) 70 - 99 mg/dL    Comment: Glucose reference range applies only to samples taken after fasting for at least 8 hours.  Blood gas, arterial     Status: Abnormal   Collection Time: 05/09/23 11:24 PM  Result Value Ref Range   O2 Content 3.0 L/min   RATE 32 resp/min   pH, Arterial 7.51 (H) 7.35 - 7.45   pCO2 arterial 31 (L) 32 - 48 mmHg   pO2, Arterial 91 83 - 108 mmHg   Bicarbonate 24.7 20.0 - 28.0 mmol/L   Acid-Base Excess 1.8 0.0 - 2.0 mmol/L   O2 Saturation 100 %   Patient  temperature 36.8    Collection site RIGHT RADIAL    Drawn by [id number]    Allens test (pass/fail) PASS PASS    Comment: Performed at [facility], [address] [place], [place] [address]   DG Chest Port 1 View Result Date: 05/09/2023 CLINICAL DATA:  Sepsis EXAM: PORTABLE CHEST 1 VIEW COMPARISON:  X-ray 01/09/2023 and older. FINDINGS: Hyperinflation. New consolidative patchy opacities along the left lung base. Question tiny effusion. No pneumothorax or edema. Normal cardiopericardial silhouette with calcified aorta. Osteopenia with degenerative changes of the spine and shoulder. Elevated right humeral head. Please correlate for rotator cuff tear. Presumed coronary stents. IMPRESSION: Consolidative patchy opacity at the left lung base. Possible infiltrate or pneumonia based on history. Recommend follow up to confirm clearance. Electronically Signed   By: Ranell Bring M.D.   On: 05/09/2023 17:47    Pending Labs Unresulted Labs (From admission, onward)     Start     Ordered   05/10/23 0500  CBC  Tomorrow morning,   R        05/09/23 2146   05/10/23 0500  Basic metabolic panel  Tomorrow morning,   R        05/09/23 2146   05/10/23 0500  Magnesium   Tomorrow morning,   R        05/09/23 2146  05/10/23 0500  Phosphorus  Tomorrow morning,   R        05/09/23 2146   05/10/23 0500  Ammonia  Tomorrow morning,   R        05/09/23 2206   05/10/23 0500  Lactic acid, plasma  (Lactic Acid)  Tomorrow morning,   R        05/09/23 2212   05/09/23 2150  MRSA Next Gen by PCR, Nasal  (MRSA Screening)  Once,   R        05/09/23 2149   05/09/23 2146  Legionella Pneumophila Serogp 1 Ur Ag  Once,   R        05/09/23 2146   05/09/23 2146  Strep pneumoniae urinary antigen (not at [facility])  Once,   R        05/09/23 2146   05/09/23 1613  Urine Culture  Once,   R        05/09/23 1613   05/09/23 1528  Blood Culture (routine x 2)  (Septic presentation on arrival (screening labs, nursing and  treatment orders for obvious sepsis))  BLOOD CULTURE X 2,   STAT      05/09/23 1529            Vitals/Pain Today's Vitals   05/09/23 2220 05/09/23 2230 05/09/23 2248 05/09/23 2330  BP: 92/73 (!) 81/56 (!) 95/43 (!) 87/51  Pulse: 94 (!) 102 90 87  Resp: 19 (!) 21 (!) 22 (!) 31  Temp:      TempSrc:      SpO2: 98% 91% 94% 97%  Weight:      Height:      PainSc:        Isolation Precautions Airborne and Contact precautions  Medications Medications  lactated ringers  infusion ( Intravenous New Bag/Given 05/09/23 1652)  docusate sodium  (COLACE) capsule 100 mg (100 mg Oral Not Given 05/09/23 2221)  polyethylene glycol (MIRALAX  / GLYCOLAX ) packet 17 g (17 g Oral Not Given 05/09/23 2222)  heparin  injection 5,000 Units (5,000 Units Subcutaneous Given 05/09/23 2210)  acetaminophen  (TYLENOL ) tablet 650 mg (has no administration in time range)  ipratropium-albuterol  (DUONEB) 0.5-2.5 (3) MG/3ML nebulizer solution 3 mL (has no administration in time range)  levothyroxine  (SYNTHROID ) tablet 175 mcg (has no administration in time range)  multivitamin with minerals tablet 1 tablet (has no administration in time range)  famotidine  (PEPCID ) tablet 20 mg (has no administration in time range)  azithromycin  (ZITHROMAX ) 500 mg in sodium chloride  0.9 % 250 mL IVPB (has no administration in time range)  cefTRIAXone  (ROCEPHIN ) 1 g in sodium chloride  0.9 % 100 mL IVPB (has no administration in time range)  vancomycin  (VANCOREADY) IVPB 1250 mg/250 mL (750 mg Intravenous New Bag/Given 05/09/23 2225)  insulin  aspart (novoLOG ) injection 1-3 Units ( Subcutaneous Not Given 05/09/23 2203)  dexamethasone  (DECADRON ) injection 6 mg (has no administration in time range)  potassium chloride  10 mEq in 100 mL IVPB (10 mEq Intravenous New Bag/Given 05/09/23 2210)  remdesivir  100 mg in sodium chloride  0.9 % 100 mL IVPB (has no administration in time range)    Followed by  remdesivir  100 mg in sodium chloride  0.9 %  100 mL IVPB (has no administration in time range)  remdesivir  100 mg in sodium chloride  0.9 % 100 mL IVPB (has no administration in time range)  magnesium  sulfate IVPB 2 g 50 mL (has no administration in time range)  lactated ringers  bolus 500 mL (0 mLs Intravenous Stopped 05/09/23 1752)  cefTRIAXone  (  ROCEPHIN ) 2 g in sodium chloride  0.9 % 100 mL IVPB (0 g Intravenous Stopped 05/09/23 1643)  azithromycin  (ZITHROMAX ) 500 mg in sodium chloride  0.9 % 250 mL IVPB (0 mg Intravenous Stopped 05/09/23 1750)  albuterol  (PROVENTIL ) (2.5 MG/3ML) 0.083% nebulizer solution 5 mg (5 mg Nebulization Given 05/09/23 1620)  ipratropium (ATROVENT ) nebulizer solution 0.5 mg (0.5 mg Nebulization Given 05/09/23 1620)  lactated ringers  bolus 1,500 mL (0 mLs Intravenous Stopped 05/09/23 1937)  lactated ringers  bolus 1,000 mL (0 mLs Intravenous Stopped 05/09/23 2203)  dexamethasone  (DECADRON ) injection 10 mg (10 mg Intravenous Given 05/09/23 2210)    Mobility non-ambulatory

## 2023-05-09 NOTE — Progress Notes (Signed)
Elink following code sepsis °

## 2023-05-09 NOTE — H&P (Addendum)
 NAME:  James Sookram., MRN:  [id number], DOB:  [birth year], LOS: 0 ADMISSION DATE:  05/09/2023, CONSULTATION DATE:  05/09/2023 REFERRING MD: Bernard Drivers, MD, CHIEF COMPLAINT: Hypoxia today  History of Present Illness:  A 89 y.o. male with SAH (Sep 2024), frequent falls, dementia, PAF (paroxysmal atrial fibrillation), CAD S/P LAD stent 2022, HTN, hypothyroidism, mixed conductive and sensorineural hearing loss, asthma, anemia of chronic illness, severe protein-calorie malnutrition, urinary retention, dyslipidemia, GERD, depression, and anxiety, who was brought from SNF with hypoxia 88% on RA, general weakness, AMS, fever 101 F, and odor to urine. No reported  trauma/fall. EMS indicates bp soft 104/60, cbg 193. He was hypotensive in ED with BP 83/50.  Placed on 3 L [place], given 3 L LR, Rocephin , Zithromax , KCL IV.  Pertinent  Medical History  SAH (Sep 2024), frequent falls, dementia, PAF (paroxysmal atrial fibrillation), CAD S/P LAD stent 2022, HTN, hypothyroidism, mixed conductive and sensorineural hearing loss, bilateral, asthma, anemia of chronic illness, severe protein-calorie malnutrition, urinary retention, dyslipidemia, GERD, depression, anxiety  Significant Hospital Events: Including procedures, antibiotic start and stop dates in addition to other pertinent events     Interim History / Subjective:    Objective   Blood pressure (!) 94/52, pulse 94, temperature 98.2 F (36.8 C), temperature source Oral, resp. rate (!) 25, height 6' (1.829 m), weight 57 kg, SpO2 98%.        Intake/Output Summary (Last 24 hours) at 05/09/2023 2156 Last data filed at 05/09/2023 1937 Gross per 24 hour  Intake 2350 ml  Output --  Net 2350 ml   Filed Weights   05/09/23 1515  Weight: 57 kg    Examination: General: lethargic. On 3 L [place]. SpO2 91%  HENT: PERL, dry pharynx and oral mucosa. Poor oral hygiene No LNE or thyromegaly. No JVD Lungs: symmetrical air entry bilaterally. No crackles or  wheezing Cardiovascular: NL S1/S2. No m/g/r Abdomen: no distension or tenderness Extremities: +1 edema distally. Symmetrical  Neuro: nonfocal   DTI both heels  Resolved Hospital Problem list     Assessment & Plan:  Sepsis and lactic acidosis due to LLL PNA, COVID-19 PNA, possible aspiration PNA (hx of dysphagia) and UTI -admit to ICU -Remdesivir   -Steroids -Duoneb -I/S  -IVF -Serial LA and Trop -AM labs -Rocephin  and Zithromax  -One dose of Vnaco -MRSA screening -Urine Ags -Bcx2 -Droplet isolation -ISS/glycemic control   Acute hypoxa -ABG -O2, keeping SpO2 >90%  Metabolic encephalopathy due to sepsis and hypoxia -ammonia level  -ABG  AKI -IVF -Am labs -Avoid nephrotoxins  HypoK -Replace -Am labs  HyperNa -LR -Am labs  PAF: controlled rate. CAD S/P LAD stent 2022, HTN, dyslipidemia. Echo EF 50% August 2024 -Resume home meds -ASA when stable   Hypothyroidism -Resume home meds  Asthma -Duoneb PRN  Anemia of chronic illness and Fe def based on August 2024 labs -Monitor CBC -PO Fe when able to swallow  Severe protein-calorie malnutrition -ST eval and probably PEG tube -Dietitian consult  Urinary retention/BPH -Monitor UOP and bladder volume   GERD -H2B  Depression, anxiety, dementia -Resume home meds when appropriate   DNR/DNI -Confirmed with his daughter  DTI both heels -Wound care consult   Best Practice (right click and Reselect all SmartList Selections daily)   Diet/type: NPO DVT prophylaxis prophylactic heparin   Pressure ulcer(s): present on admission  GI prophylaxis: H2B Lines: N/A Foley:  Yes, and it is still needed Code Status:  DNR Last date of multidisciplinary goals of care discussion []   Labs   CBC: Recent Labs  Lab 05/09/23 1527  WBC 24.5*  NEUTROABS 23.1*  HGB 11.6*  HCT 40.6  MCV 95.1  PLT 291    Basic Metabolic Panel: Recent Labs  Lab 05/09/23 1527  NA 159*  K 3.4*  CL 128*  CO2 24  GLUCOSE  134*  BUN 43*  CREATININE 1.37*  CALCIUM  8.7*   GFR: Estimated Creatinine Clearance: 29.5 mL/min (A) (by C-G formula based on SCr of 1.37 mg/dL (H)). Recent Labs  Lab 05/09/23 1527 05/09/23 1634 05/09/23 1755  WBC 24.5*  --   --   LATICACIDVEN  --  4.6* 6.2*    Liver Function Tests: Recent Labs  Lab 05/09/23 1527  AST 28  ALT 23  ALKPHOS 65  BILITOT 0.8  PROT 6.8  ALBUMIN 2.2*   No results for input(s): LIPASE, AMYLASE in the last 168 hours. No results for input(s): AMMONIA in the last 168 hours.  ABG    Component Value Date/Time   TCO2 21 (L) 12/15/2022 1849     Coagulation Profile: No results for input(s): INR, PROTIME in the last 168 hours.  Cardiac Enzymes: No results for input(s): CKTOTAL, CKMB, CKMBINDEX, TROPONINI in the last 168 hours.  HbA1C: Hgb A1c MFr Bld  Date/Time Value Ref Range Status  06/22/2020 05:13 AM 6.3 (H) 4.8 - 5.6 % Final    Comment:    (NOTE) Pre diabetes:          5.7%-6.4%  Diabetes:              >6.4%  Glycemic control for   <7.0% adults with diabetes     CBG: No results for input(s): GLUCAP in the last 168 hours.  Review of Systems:   AMS  Past Medical History:  He,  has a past medical history of Acquired hypothyroidism (08/15/2019), Acute systolic heart failure (HCC) (7/82/7977), Anemia (02/05/2020), Atopic dermatitis (12/05/2019), Bacterial pneumonia, unspecified (06/14/2012), Bradycardia (11/28/2018), CAD (coronary artery disease) (07/07/2020), Cancer [facility]), Closed fracture dislocation of right elbow, Closed fracture of right olecranon process (04/10/2020), COPD (chronic obstructive pulmonary disease) (HCC) (01/21/2020), Dermatitis (02/05/2020), Dizziness (02/19/2016), Essential hypertension (11/28/2018), History of prostate cancer, Hyperlipemia, Hyperlipidemia, mixed (08/21/2008), LVH (left ventricular hypertrophy) (11/28/2018), Malnutrition of moderate degree (HCC) (08/15/2019), Mixed conductive and sensorineural  hearing loss, bilateral (08/15/2019), Moderate persistent asthma, uncomplicated (01/21/2020), Nonsustained ventricular tachycardia (HCC) (01/30/2019), NSTEMI (non-ST elevated myocardial infarction) (HCC) (06/21/2020), Other fatigue (02/05/2020), PAF (paroxysmal atrial fibrillation) (HCC) (06/25/2020), Seasonal and perennial allergic rhinitis (05/17/2007), Senile osteoporosis (08/15/2019), SINUSITIS, ACUTE (06/28/2007), and Ventricular ectopy (11/28/2018).   Surgical History:   Past Surgical History:  Procedure Laterality Date   APPENDECTOMY     CORONARY ATHERECTOMY N/A 06/24/2020   Procedure: CORONARY ATHERECTOMY;  Surgeon: Darron Deatrice LABOR, MD;  Location: [facility];  Service: Cardiovascular;  Laterality: N/A;   CORONARY ULTRASOUND/IVUS N/A 06/24/2020   Procedure: Intravascular Ultrasound/IVUS;  Surgeon: Darron Deatrice LABOR, MD;  Location: [facility];  Service: Cardiovascular;  Laterality: N/A;   IR GASTROSTOMY TUBE MOD SED  01/10/2023   LEFT HEART CATH AND CORONARY ANGIOGRAPHY N/A 06/22/2020   Procedure: LEFT HEART CATH AND CORONARY ANGIOGRAPHY;  Surgeon: Mady Bruckner, MD;  Location: [facility];  Service: Cardiovascular;  Laterality: N/A;   ORIF ELBOW FRACTURE Right 04/10/2020   Procedure: OPEN REDUCTION INTERNAL FIXATION (ORIF) ELBOW/OLECRANON FRACTURE;  Surgeon: Josefina Chew, MD;  Location: [facility];  Service: Orthopedics;  Laterality: Right;   PR INSJ TEMP NDWELLG  BLADDER CATHETER COMPLICATED  01/12/2023     Social History:   reports that he quit smoking about 65 years ago. His smoking use included cigarettes. He quit smokeless tobacco use about 14 years ago.  His smokeless tobacco use included chew. He reports that he does not drink alcohol  and does not use drugs.   Family History:  His family history includes Alzheimer's disease in his father; Diabetes in his mother.   Allergies Allergies  Allergen Reactions   Clarithromycin Other (See Comments)    GI  upset- Allergic, per [facility]     Home Medications  Prior to Admission medications   Medication Sig Start Date End Date Taking? Authorizing Provider  acetaminophen  (TYLENOL ) 325 MG tablet Take 650 mg by mouth every 4 (four) hours as needed for mild pain (pain score 1-3) (or discomfort).   Yes [provider]  albuterol  (VENTOLIN  HFA) 108 (90 Base) MCG/ACT inhaler Inhale 2 puffs into the lungs every 6 (six) hours as needed for wheezing or shortness of breath. 10/12/22  Yes Cox, Kirsten, MD  Alcaftadine 0.25 % SOLN Place 1 drop into both eyes in the morning.   Yes [provider]  amiodarone  (PACERONE ) 200 MG tablet 200 mg twice a day through the feeding tube for 1 week followed by 200 mg once a day. Patient taking differently: Take 200 mg by mouth in the morning. 01/19/23  Yes Krishnan, Gokul, MD  atomoxetine  (STRATTERA ) 40 MG capsule Take 40 mg by mouth daily.   Yes [provider]  atorvastatin  (LIPITOR ) 40 MG tablet Take 40 mg by mouth daily.   Yes [provider]  bisacodyl  (DULCOLAX) 10 MG suppository Place 10 mg rectally as needed (for constipation not relieved by Milk of Magnesia).   Yes [provider]  famotidine  (PEPCID ) 40 MG tablet Take 40 mg by mouth in the morning.   Yes [provider]  ferrous sulfate  (FERROUSUL) 325 (65 FE) MG tablet Take 1 tablet (325 mg total) by mouth daily with breakfast. Patient taking differently: Take 325 mg by mouth See admin instructions. Take 325 mg by mouth with food every other morning 10/12/22  Yes Cox, Kirsten, MD  fexofenadine (ALLEGRA) 60 MG tablet Take 60 mg by mouth daily.   Yes [provider]  ipratropium-albuterol  (DUONEB) 0.5-2.5 (3) MG/3ML SOLN Take 3 mLs by nebulization every 4 (four) hours as needed (shortness of breath or wheezing). 09/22/21  Yes Abran Jerilynn Loving, MD  levothyroxine  (SYNTHROID ) 175 MCG tablet TAKE ONE TABLET BY MOUTH BEFORE BREAKFAST Patient taking differently:  Take 175 mcg by mouth daily before breakfast. 12/06/22  Yes Cox, Kirsten, MD  magnesium  hydroxide (MILK OF MAGNESIA) 400 MG/5ML suspension Take 30 mLs by mouth daily as needed for mild constipation.   Yes [provider]  metoprolol  tartrate (LOPRESSOR ) 25 MG tablet Place 1 tablet (25 mg total) into feeding tube 2 (two) times daily. 01/13/23  Yes Regalado, Belkys A, MD  Multiple Vitamin (MULTIVITAMIN) tablet Take 1 tablet by mouth daily with breakfast.   Yes [provider]  NON FORMULARY Take 120 mLs by mouth See admin instructions. SUGAR-FREE MedPass- Drink 120 ml's by mouth three times a day   Yes [provider]  Sodium Phosphates (ENEMA DISPOSABLE) ENEM Place 1 enema rectally daily as needed (for constipation not relieved by a Dulcolax suppository).   Yes [provider]  tamsulosin (FLOMAX) 0.4 MG CAPS capsule Take 0.4 mg by mouth daily after breakfast.   Yes [provider]  thiamine  (VITAMIN B-1) 100 MG tablet Place 1 tablet (100 mg total) into feeding tube daily. Patient taking differently: Take 100 mg by mouth daily. 01/14/23  Yes Regalado, Belkys A, MD  atorvastatin  (LIPITOR ) 80 MG tablet Take 0.5 tablets (40 mg total) by mouth every morning. Patient not taking: Reported on 05/09/2023 01/13/23   Regalado, Owen A, MD  famotidine  (PEPCID ) 40 MG/5ML suspension Take 5 mLs (40 mg total) by mouth daily. Patient not taking: Reported on 05/09/2023 01/14/23   Regalado, Owen A, MD  Nutritional Supplements (FEEDING SUPPLEMENT, OSMOLITE 1.5 CAL,) LIQD Place 1,000 mLs into feeding tube continuous. 55 mL/h Patient not taking: Reported on 05/09/2023 01/19/23   Verdene Purchase, MD  Protein (FEEDING SUPPLEMENT, PROSOURCE TF20,) liquid Place 60 mLs into feeding tube daily. Patient not taking: Reported on 05/09/2023 01/14/23   Regalado, Owen A, MD  Water  For Irrigation, Sterile (FREE WATER ) SOLN Place 75 mLs into feeding tube every 6 (six) hours. Patient not taking:  Reported on 05/09/2023 01/19/23   Verdene Purchase, MD     Critical care time: 60 min

## 2023-05-09 NOTE — ED Provider Notes (Addendum)
  EMERGENCY DEPARTMENT AT [facility] Provider Note   CSN: [id number] Arrival date & time: 05/09/23  1500     History  Chief Complaint  Patient presents with   Code Sepsis    James Leeth Sr. is a 89 y.o. male.  Pt from SNF with general weakness, altered mental status, odor to urine. Pt not verbally responsive - level 5 caveat. No report of fevers. No report of trauma/fall.  EMS indicates bp soft 104/60, cbg 193.   The history is provided by the patient, medical records and the EMS personnel. The history is limited by the condition of the patient.       Home Medications Prior to Admission medications   Medication Sig Start Date End Date Taking? Authorizing Provider  acetaminophen  (TYLENOL ) 325 MG tablet Take 650 mg by mouth every 4 (four) hours as needed for mild pain (pain score 1-3) (or discomfort).   Yes [provider]  albuterol  (VENTOLIN  HFA) 108 (90 Base) MCG/ACT inhaler Inhale 2 puffs into the lungs every 6 (six) hours as needed for wheezing or shortness of breath. 10/12/22  Yes Cox, Kirsten, MD  Alcaftadine 0.25 % SOLN Place 1 drop into both eyes in the morning.   Yes [provider]  amiodarone  (PACERONE ) 200 MG tablet 200 mg twice a day through the feeding tube for 1 week followed by 200 mg once a day. Patient taking differently: Take 200 mg by mouth in the morning. 01/19/23  Yes Krishnan, Gokul, MD  atomoxetine  (STRATTERA ) 40 MG capsule Take 40 mg by mouth daily.   Yes [provider]  atorvastatin  (LIPITOR ) 40 MG tablet Take 40 mg by mouth daily.   Yes [provider]  bisacodyl  (DULCOLAX) 10 MG suppository Place 10 mg rectally as needed (for constipation not relieved by Milk of Magnesia).   Yes [provider]  famotidine  (PEPCID ) 40 MG tablet Take 40 mg by mouth in the morning.   Yes [provider]  ferrous sulfate  (FERROUSUL) 325 (65 FE) MG tablet Take 1 tablet (325 mg total) by mouth  daily with breakfast. Patient taking differently: Take 325 mg by mouth See admin instructions. Take 325 mg by mouth with food every other morning 10/12/22  Yes Cox, Kirsten, MD  fexofenadine (ALLEGRA) 60 MG tablet Take 60 mg by mouth daily.   Yes [provider]  ipratropium-albuterol  (DUONEB) 0.5-2.5 (3) MG/3ML SOLN Take 3 mLs by nebulization every 4 (four) hours as needed (shortness of breath or wheezing). 09/22/21  Yes Abran Jerilynn Loving, MD  levothyroxine  (SYNTHROID ) 175 MCG tablet TAKE ONE TABLET BY MOUTH BEFORE BREAKFAST Patient taking differently: Take 175 mcg by mouth daily before breakfast. 12/06/22  Yes Cox, Kirsten, MD  magnesium  hydroxide (MILK OF MAGNESIA) 400 MG/5ML suspension Take 30 mLs by mouth daily as needed for mild constipation.   Yes [provider]  metoprolol  tartrate (LOPRESSOR ) 25 MG tablet Place 1 tablet (25 mg total) into feeding tube 2 (two) times daily. 01/13/23  Yes Regalado, Belkys A, MD  Multiple Vitamin (MULTIVITAMIN) tablet Take 1 tablet by mouth daily with breakfast.   Yes [provider]  NON FORMULARY Take 120 mLs by mouth See admin instructions. SUGAR-FREE MedPass- Drink 120 ml's by mouth three times a day   Yes [provider]  Sodium Phosphates (ENEMA DISPOSABLE) ENEM Place 1 enema rectally daily as needed (for constipation not relieved by a Dulcolax suppository).   Yes [provider]  tamsulosin (FLOMAX) 0.4 MG  CAPS capsule Take 0.4 mg by mouth daily after breakfast.   Yes [provider]  thiamine  (VITAMIN B-1) 100 MG tablet Place 1 tablet (100 mg total) into feeding tube daily. Patient taking differently: Take 100 mg by mouth daily. 01/14/23  Yes Regalado, Belkys A, MD  atorvastatin  (LIPITOR ) 80 MG tablet Take 0.5 tablets (40 mg total) by mouth every morning. Patient not taking: Reported on 05/09/2023 01/13/23   Regalado, Owen A, MD  famotidine  (PEPCID ) 40 MG/5ML suspension Take 5 mLs (40 mg total) by mouth  daily. Patient not taking: Reported on 05/09/2023 01/14/23   Regalado, Owen A, MD  Nutritional Supplements (FEEDING SUPPLEMENT, OSMOLITE 1.5 CAL,) LIQD Place 1,000 mLs into feeding tube continuous. 55 mL/h Patient not taking: Reported on 05/09/2023 01/19/23   Verdene Purchase, MD  Protein (FEEDING SUPPLEMENT, PROSOURCE TF20,) liquid Place 60 mLs into feeding tube daily. Patient not taking: Reported on 05/09/2023 01/14/23   Regalado, Belkys A, MD  Water  For Irrigation, Sterile (FREE WATER ) SOLN Place 75 mLs into feeding tube every 6 (six) hours. Patient not taking: Reported on 05/09/2023 01/19/23   Verdene Purchase, MD      Allergies    Clarithromycin    Review of Systems   Review of Systems  Unable to perform ROS: Mental status change  Constitutional:  Negative for fever.    Physical Exam Updated Vital Signs BP (!) 93/55   Pulse 95   Temp 98.2 F (36.8 C) (Oral)   Resp 20   Ht 1.829 m (6')   Wt 57 kg   SpO2 95%   BMI 17.04 kg/m  Physical Exam Vitals and nursing note reviewed.  Constitutional:      Appearance: He is well-developed.     Comments: Frail, cachectic appearing.   HENT:     Head: Atraumatic.     Nose: Nose normal.     Mouth/Throat:     Mouth: Mucous membranes are moist.     Pharynx: Oropharynx is clear.  Eyes:     General: No scleral icterus.    Conjunctiva/sclera: Conjunctivae normal.     Pupils: Pupils are equal, round, and reactive to light.  Neck:     Trachea: No tracheal deviation.     Comments: No stiffness or rigidity.  Cardiovascular:     Rate and Rhythm: Regular rhythm. Tachycardia present.     Pulses: Normal pulses.     Heart sounds: Normal heart sounds. No murmur heard.    No friction rub. No gallop.  Pulmonary:     Effort: Pulmonary effort is normal. No accessory muscle usage or respiratory distress.     Breath sounds: Wheezing and rhonchi present.  Abdominal:     General: Bowel sounds are normal. There is no distension.     Palpations:  Abdomen is soft.     Tenderness: There is no abdominal tenderness.  Genitourinary:    Comments: No cva tenderness. Foley, concentrated yellow urine in bag.  Musculoskeletal:        General: No swelling or tenderness.     Cervical back: Normal range of motion and neck supple. No rigidity.  Skin:    General: Skin is warm and dry.     Findings: No rash.  Neurological:     Mental Status: He is alert.     Comments: Awake, alert appearing. Moves bil extremities purposefully.   Psychiatric:        Mood and Affect: Mood normal.     ED Results / Procedures /  Treatments   Labs (all labs ordered are listed, but only abnormal results are displayed) Results for orders placed or performed during the hospital encounter of 05/09/23  Comprehensive metabolic panel   Collection Time: 05/09/23  3:27 PM  Result Value Ref Range   Sodium 159 (H) 135 - 145 mmol/L   Potassium 3.4 (L) 3.5 - 5.1 mmol/L   Chloride 128 (H) 98 - 111 mmol/L   CO2 24 22 - 32 mmol/L   Glucose, Bld 134 (H) 70 - 99 mg/dL   BUN 43 (H) 8 - 23 mg/dL   Creatinine, Ser 8.62 (H) 0.61 - 1.24 mg/dL   Calcium  8.7 (L) 8.9 - 10.3 mg/dL   Total Protein 6.8 6.5 - 8.1 g/dL   Albumin 2.2 (L) 3.5 - 5.0 g/dL   AST 28 15 - 41 U/L   ALT 23 0 - 44 U/L   Alkaline Phosphatase 65 38 - 126 U/L   Total Bilirubin 0.8 0.0 - 1.2 mg/dL   GFR, Estimated 49 (L) >60 mL/min   Anion gap 7 5 - 15  CBC with Differential   Collection Time: 05/09/23  3:27 PM  Result Value Ref Range   WBC 24.5 (H) 4.0 - 10.5 K/uL   RBC 4.27 4.22 - 5.81 MIL/uL   Hemoglobin 11.6 (L) 13.0 - 17.0 g/dL   HCT 59.3 60.9 - 47.9 %   MCV 95.1 80.0 - 100.0 fL   MCH 27.2 26.0 - 34.0 pg   MCHC 28.6 (L) 30.0 - 36.0 g/dL   RDW 83.9 (H) 88.4 - 84.4 %   Platelets 291 150 - 400 K/uL   nRBC 0.0 0.0 - 0.2 %   Neutrophils Relative % 94 %   Neutro Abs 23.1 (H) 1.7 - 7.7 K/uL   Lymphocytes Relative 2 %   Lymphs Abs 0.6 (L) 0.7 - 4.0 K/uL   Monocytes Relative 3 %   Monocytes Absolute 0.6  0.1 - 1.0 K/uL   Eosinophils Relative 0 %   Eosinophils Absolute 0.1 0.0 - 0.5 K/uL   Basophils Relative 0 %   Basophils Absolute 0.1 0.0 - 0.1 K/uL   Immature Granulocytes 1 %   Abs Immature Granulocytes 0.18 (H) 0.00 - 0.07 K/uL  Brain natriuretic peptide   Collection Time: 05/09/23  3:27 PM  Result Value Ref Range   B Natriuretic Peptide 131.0 (H) 0.0 - 100.0 pg/mL  Troponin I (High Sensitivity)   Collection Time: 05/09/23  3:27 PM  Result Value Ref Range   Troponin I (High Sensitivity) 45 (H) <18 ng/L  Resp panel by RT-PCR (RSV, Flu A&B, Covid) Anterior Nasal Swab   Collection Time: 05/09/23  4:05 PM   Specimen: Anterior Nasal Swab  Result Value Ref Range   SARS Coronavirus 2 by RT PCR POSITIVE (A) NEGATIVE   Influenza A by PCR NEGATIVE NEGATIVE   Influenza B by PCR NEGATIVE NEGATIVE   Resp Syncytial Virus by PCR NEGATIVE NEGATIVE  Urinalysis, w/ Reflex to Culture (Infection Suspected) -Urine, Catheterized; Indwelling urinary catheter   Collection Time: 05/09/23  4:13 PM  Result Value Ref Range   Specimen Source URINE, CATHETERIZED    Color, Urine YELLOW YELLOW   APPearance CLOUDY (A) CLEAR   Specific Gravity, Urine 1.021 1.005 - 1.030   pH 5.0 5.0 - 8.0   Glucose, UA NEGATIVE NEGATIVE mg/dL   Hgb urine dipstick MODERATE (A) NEGATIVE   Bilirubin Urine NEGATIVE NEGATIVE   Ketones, ur NEGATIVE NEGATIVE mg/dL   Protein, ur 899 (A)  NEGATIVE mg/dL   Nitrite NEGATIVE NEGATIVE   Leukocytes,Ua LARGE (A) NEGATIVE   RBC / HPF 11-20 0 - 5 RBC/hpf   WBC, UA >50 0 - 5 WBC/hpf   Bacteria, UA MANY (A) NONE SEEN   Squamous Epithelial / HPF 0-5 0 - 5 /HPF   Mucus PRESENT   I-Stat Lactic Acid, ED   Collection Time: 05/09/23  4:34 PM  Result Value Ref Range   Lactic Acid, Venous 4.6 (HH) 0.5 - 1.9 mmol/L  I-Stat Lactic Acid, ED   Collection Time: 05/09/23  5:55 PM  Result Value Ref Range   Lactic Acid, Venous 6.2 (HH) 0.5 - 1.9 mmol/L      EKG EKG  Interpretation Date/Time:  Tuesday May 09 2023 16:03:51 EST Ventricular Rate:  110 PR Interval:  153 QRS Duration:  80 QT Interval:  371 QTC Calculation: 502 R Axis:   54  Text Interpretation: Sinus tachycardia Non-specific ST-t changes Baseline wander Prolonged QT interval Artifact Confirmed by Bernard Drivers ([id number]) on 05/09/2023 4:09:54 PM  Radiology DG Chest Port 1 View Result Date: 05/09/2023 CLINICAL DATA:  Sepsis EXAM: PORTABLE CHEST 1 VIEW COMPARISON:  X-ray 01/09/2023 and older. FINDINGS: Hyperinflation. New consolidative patchy opacities along the left lung base. Question tiny effusion. No pneumothorax or edema. Normal cardiopericardial silhouette with calcified aorta. Osteopenia with degenerative changes of the spine and shoulder. Elevated right humeral head. Please correlate for rotator cuff tear. Presumed coronary stents. IMPRESSION: Consolidative patchy opacity at the left lung base. Possible infiltrate or pneumonia based on history. Recommend follow up to confirm clearance. Electronically Signed   By: Ranell Bring M.D.   On: 05/09/2023 17:47    Procedures Procedures    Medications Ordered in ED Medications  lactated ringers  infusion ( Intravenous New Bag/Given 05/09/23 1652)  lactated ringers  bolus 500 mL (0 mLs Intravenous Stopped 05/09/23 1752)  cefTRIAXone  (ROCEPHIN ) 2 g in sodium chloride  0.9 % 100 mL IVPB (0 g Intravenous Stopped 05/09/23 1643)  azithromycin  (ZITHROMAX ) 500 mg in sodium chloride  0.9 % 250 mL IVPB (0 mg Intravenous Stopped 05/09/23 1750)  albuterol  (PROVENTIL ) (2.5 MG/3ML) 0.083% nebulizer solution 5 mg (5 mg Nebulization Given 05/09/23 1620)  ipratropium (ATROVENT ) nebulizer solution 0.5 mg (0.5 mg Nebulization Given 05/09/23 1620)  lactated ringers  bolus 1,500 mL (0 mLs Intravenous Stopped 05/09/23 1937)  lactated ringers  bolus 1,000 mL (1,000 mLs Intravenous New Bag/Given 05/09/23 2054)    ED Course/ Medical Decision Making/ A&P                                  Medical Decision Making Problems Addressed: Acute febrile illness: acute illness or injury with systemic symptoms that poses a threat to life or bodily functions Acute respiratory infection: acute illness or injury with systemic symptoms that poses a threat to life or bodily functions AKI (acute kidney injury) (HCC): acute illness or injury with systemic symptoms that poses a threat to life or bodily functions Dehydration: acute illness or injury with systemic symptoms that poses a threat to life or bodily functions Elevated lactic acid level: acute illness or injury Hypernatremia: acute illness or injury with systemic symptoms that poses a threat to life or bodily functions Hypoalbuminemia: chronic illness or injury with exacerbation, progression, or side effects of treatment that poses a threat to life or bodily functions Hypokalemia: acute illness or injury Sepsis due to urinary tract infection (HCC): acute illness or injury with systemic  symptoms that poses a threat to life or bodily functions Severe protein-calorie malnutrition (HCC): chronic illness or injury with exacerbation, progression, or side effects of treatment that poses a threat to life or bodily functions  Amount and/or Complexity of Data Reviewed Independent Historian: EMS    Details: hx External Data Reviewed: notes. Labs: ordered. Decision-making details documented in ED Course. Radiology: ordered and independent interpretation performed. Decision-making details documented in ED Course. ECG/medicine tests: ordered and independent interpretation performed. Decision-making details documented in ED Course. Discussion of management or test interpretation with external provider(s): medicine  Risk Prescription drug management. Decision regarding hospitalization.   Iv ns. Continuous pulse ox and cardiac monitoring. Labs ordered/sent. Imaging ordered.   Differential diagnosis includes uti, sepsis, etc.  Dispo decision including potential need for admission considered - will get labs and imaging and reassess.   Reviewed nursing notes and prior charts for additional history. External reports reviewed. Additional history from: EMS,family.   Cardiac monitor: sinus rhythm, rate 100.  LR bolus. Cultures sent. Iv abx. Albuterol  and atrovent  neb.   Labs reviewed/interpreted by me - wbc high, hct 40. Na low. Aki. Ua c/w uti. Cxs sent.   Xrays reviewed/interpreted by me - left infiltrates  Initial lactate high. 30 cc/kg ivf bolus.   CRITICAL CARE RE:  acute respiratory failure with hypoxia, uti, sepsis.  Performed by: Keyanah Kozicki E Taelyn Broecker Total critical care time: 125 minutes Critical care time was exclusive of separately billable procedures and treating other patients. Critical care was necessary to treat or prevent imminent or life-threatening deterioration. Critical care was time spent personally by me on the following activities: development of treatment plan with patient and/or surrogate as well as nursing, discussions with consultants, evaluation of patient's response to treatment, examination of patient, obtaining history from patient or surrogate, ordering and performing treatments and interventions, ordering and review of laboratory studies, ordering and review of radiographic studies, pulse oximetry and re-evaluation of patient's condition.  Medicine consulted for admission. Discussed pt - requests critical care be consulted for possible admission - critical care team consulted.  Paged and repaged critical care.  Critical care returned call at 2105, discussed pt, they will admit.   Recheck improved air movement. O2 sats 90S on 4 liters.   Feel may benefit from palliative consult during admission.             Final Clinical Impression(s) / ED Diagnoses Final diagnoses:  Acute febrile illness  Sepsis due to urinary tract infection (HCC)  AKI (acute kidney injury) (HCC)  Dehydration   Elevated lactic acid level  Hypoalbuminemia  Severe protein-calorie malnutrition (HCC)  Hypernatremia  Hypokalemia  Acute respiratory infection  COVID-19 virus infection    Rx / DC Orders ED Discharge Orders     None          Bernard Drivers, MD 05/09/23 2108

## 2023-05-09 NOTE — Progress Notes (Signed)
RT note: ABG obtained, labelled/sent to Lab

## 2023-05-10 ENCOUNTER — Encounter (HOSPITAL_COMMUNITY): Payer: Self-pay | Admitting: Pulmonary Disease

## 2023-05-10 DIAGNOSIS — E43 Unspecified severe protein-calorie malnutrition: Secondary | ICD-10-CM | POA: Diagnosis not present

## 2023-05-10 DIAGNOSIS — Z515 Encounter for palliative care: Secondary | ICD-10-CM

## 2023-05-10 DIAGNOSIS — R6520 Severe sepsis without septic shock: Secondary | ICD-10-CM | POA: Diagnosis not present

## 2023-05-10 DIAGNOSIS — U071 COVID-19: Secondary | ICD-10-CM

## 2023-05-10 DIAGNOSIS — N390 Urinary tract infection, site not specified: Secondary | ICD-10-CM

## 2023-05-10 DIAGNOSIS — A419 Sepsis, unspecified organism: Secondary | ICD-10-CM

## 2023-05-10 DIAGNOSIS — Z7189 Other specified counseling: Secondary | ICD-10-CM

## 2023-05-10 DIAGNOSIS — R7989 Other specified abnormal findings of blood chemistry: Secondary | ICD-10-CM

## 2023-05-10 DIAGNOSIS — Z66 Do not resuscitate: Secondary | ICD-10-CM

## 2023-05-10 DIAGNOSIS — R6521 Severe sepsis with septic shock: Secondary | ICD-10-CM | POA: Diagnosis not present

## 2023-05-10 DIAGNOSIS — J069 Acute upper respiratory infection, unspecified: Secondary | ICD-10-CM

## 2023-05-10 DIAGNOSIS — R4589 Other symptoms and signs involving emotional state: Secondary | ICD-10-CM

## 2023-05-10 LAB — BASIC METABOLIC PANEL
Anion gap: 6 (ref 5–15)
Anion gap: 4 — ABNORMAL LOW (ref 5–15)
Anion gap: 5 (ref 5–15)
BUN: 34 mg/dL — ABNORMAL HIGH (ref 8–23)
BUN: 33 mg/dL — ABNORMAL HIGH (ref 8–23)
BUN: 34 mg/dL — ABNORMAL HIGH (ref 8–23)
CO2: 24 mmol/L (ref 22–32)
CO2: 22 mmol/L (ref 22–32)
CO2: 21 mmol/L — ABNORMAL LOW (ref 22–32)
Calcium: 7.8 mg/dL — ABNORMAL LOW (ref 8.9–10.3)
Calcium: 7.5 mg/dL — ABNORMAL LOW (ref 8.9–10.3)
Calcium: 7.4 mg/dL — ABNORMAL LOW (ref 8.9–10.3)
Chloride: 125 mmol/L — ABNORMAL HIGH (ref 98–111)
Chloride: 124 mmol/L — ABNORMAL HIGH (ref 98–111)
Chloride: 124 mmol/L — ABNORMAL HIGH (ref 98–111)
Creatinine, Ser: 1.19 mg/dL (ref 0.61–1.24)
Creatinine, Ser: 0.97 mg/dL (ref 0.61–1.24)
Creatinine, Ser: 1.01 mg/dL (ref 0.61–1.24)
GFR, Estimated: 58 mL/min — ABNORMAL LOW (ref >60)
GFR, Estimated: >60 mL/min (ref >60)
GFR, Estimated: >60 mL/min (ref >60)
Glucose, Bld: 136 mg/dL — ABNORMAL HIGH (ref 70–99)
Glucose, Bld: 147 mg/dL — ABNORMAL HIGH (ref 70–99)
Glucose, Bld: 122 mg/dL — ABNORMAL HIGH (ref 70–99)
Potassium: 3.8 mmol/L (ref 3.5–5.1)
Potassium: 3 mmol/L — ABNORMAL LOW (ref 3.5–5.1)
Potassium: 3.1 mmol/L — ABNORMAL LOW (ref 3.5–5.1)
Sodium: 155 mmol/L — ABNORMAL HIGH (ref 135–145)
Sodium: 150 mmol/L — ABNORMAL HIGH (ref 135–145)
Sodium: 150 mmol/L — ABNORMAL HIGH (ref 135–145)

## 2023-05-10 LAB — PROCALCITONIN: Procalcitonin: 4.42 ng/mL

## 2023-05-10 LAB — TSH: TSH: 0.241 u[IU]/mL — ABNORMAL LOW (ref 0.350–4.500)

## 2023-05-10 LAB — CBC
HCT: 30.1 % — ABNORMAL LOW (ref 39.0–52.0)
Hemoglobin: 9.1 g/dL — ABNORMAL LOW (ref 13.0–17.0)
MCH: 28.5 pg (ref 26.0–34.0)
MCHC: 30.2 g/dL (ref 30.0–36.0)
MCV: 94.4 fL (ref 80.0–100.0)
Platelets: 183 10*3/uL (ref 150–400)
RBC: 3.19 MIL/uL — ABNORMAL LOW (ref 4.22–5.81)
RDW: 15.9 % — ABNORMAL HIGH (ref 11.5–15.5)
WBC: 17.6 10*3/uL — ABNORMAL HIGH (ref 4.0–10.5)
nRBC: 0 % (ref 0.0–0.2)

## 2023-05-10 LAB — TROPONIN I (HIGH SENSITIVITY): Troponin I (High Sensitivity): 75 ng/L — ABNORMAL HIGH (ref <18)

## 2023-05-10 LAB — STREP PNEUMONIAE URINARY ANTIGEN: Strep Pneumo Urinary Antigen: NEGATIVE

## 2023-05-10 LAB — GLUCOSE, CAPILLARY
Glucose-Capillary: 127 mg/dL — ABNORMAL HIGH (ref 70–99)
Glucose-Capillary: 121 mg/dL — ABNORMAL HIGH (ref 70–99)
Glucose-Capillary: 134 mg/dL — ABNORMAL HIGH (ref 70–99)
Glucose-Capillary: 135 mg/dL — ABNORMAL HIGH (ref 70–99)
Glucose-Capillary: 142 mg/dL — ABNORMAL HIGH (ref 70–99)

## 2023-05-10 LAB — MAGNESIUM: Magnesium: 2.1 mg/dL (ref 1.7–2.4)

## 2023-05-10 LAB — AMMONIA: Ammonia: 14 umol/L (ref 9–35)

## 2023-05-10 LAB — LACTIC ACID, PLASMA: Lactic Acid, Venous: 1.9 mmol/L (ref 0.5–1.9)

## 2023-05-10 LAB — T4, FREE: Free T4: 1.79 ng/dL — ABNORMAL HIGH (ref 0.61–1.12)

## 2023-05-10 LAB — PHOSPHORUS: Phosphorus: 3.4 mg/dL (ref 2.5–4.6)

## 2023-05-10 LAB — MRSA NEXT GEN BY PCR, NASAL: MRSA by PCR Next Gen: DETECTED — AB

## 2023-05-10 MED ORDER — NOREPINEPHRINE 4 MG/250ML-% IV SOLN
2.0000 ug/min | INTRAVENOUS | Status: DC
  Administered 2023-05-10: 2 ug/min via INTRAVENOUS
  Administered 2023-05-10: 5 ug/min via INTRAVENOUS
  Administered 2023-05-11: 4 ug/min via INTRAVENOUS
  Filled 2023-05-10 (×3): qty 250

## 2023-05-10 MED ORDER — POTASSIUM CHLORIDE 10 MEQ/100ML IV SOLN
10.0000 meq | INTRAVENOUS | Status: AC
  Administered 2023-05-10 – 2023-05-11 (×6): 10 meq via INTRAVENOUS
  Filled 2023-05-10 (×6): qty 100

## 2023-05-10 MED ORDER — LACTATED RINGERS IV BOLUS
500.0000 mL | Freq: Once | INTRAVENOUS | Status: AC
  Administered 2023-05-10: 500 mL via INTRAVENOUS

## 2023-05-10 MED ORDER — DEXTROSE 5 % IV SOLN: INTRAVENOUS | Status: DC

## 2023-05-10 MED ORDER — SALINE SPRAY 0.65 % NA SOLN: 1.0000 | NASAL | Status: DC | PRN

## 2023-05-10 MED ORDER — POTASSIUM CHLORIDE 10 MEQ/100ML IV SOLN
INTRAVENOUS | Status: AC
  Administered 2023-05-10: 10 meq via INTRAVENOUS
  Filled 2023-05-10: qty 100

## 2023-05-10 MED ORDER — SODIUM CHLORIDE 0.9 % IV SOLN
250.0000 mL | INTRAVENOUS | Status: AC
  Administered 2023-05-10: 250 mL via INTRAVENOUS

## 2023-05-10 MED ORDER — HYDROCORTISONE (PERIANAL) 2.5 % EX CREA: 1.0000 | TOPICAL_CREAM | Freq: Four times a day (QID) | CUTANEOUS | Status: DC | PRN

## 2023-05-10 MED ORDER — CARMEX CLASSIC LIP BALM EX OINT: 1.0000 | TOPICAL_OINTMENT | CUTANEOUS | Status: DC | PRN

## 2023-05-10 MED ORDER — FREE WATER: 200.0000 mL | Status: DC

## 2023-05-10 MED ORDER — CHLORHEXIDINE GLUCONATE CLOTH 2 % EX PADS
6.0000 | MEDICATED_PAD | Freq: Every day | CUTANEOUS | Status: DC
  Administered 2023-05-10 – 2023-05-13 (×4): 6 via TOPICAL

## 2023-05-10 MED ORDER — GUAIFENESIN-DM 100-10 MG/5ML PO SYRP: 5.0000 mL | ORAL_SOLUTION | ORAL | Status: DC | PRN

## 2023-05-10 NOTE — Progress Notes (Addendum)
 NAME:  James Horsch., MRN:  [id number], DOB:  17-Sep-1931, LOS: 1 ADMISSION DATE:  05/09/2023, CONSULTATION DATE:  05/09/2023 REFERRING MD: Bernard Drivers, MD, CHIEF COMPLAINT: Hypoxia today  History of Present Illness:  A 89 y.o. male with SAH (Sep 2024), frequent falls, dementia, PAF (paroxysmal atrial fibrillation), CAD S/P LAD stent 2022, HTN, hypothyroidism, mixed conductive and sensorineural hearing loss, asthma, anemia of chronic illness, severe protein-calorie malnutrition, urinary retention, dyslipidemia, GERD, depression, and anxiety, who was brought from SNF with hypoxia 88% on RA, general weakness, AMS, fever 101 F, and odor to urine. No reported  trauma/fall. EMS indicates bp soft 104/60, cbg 193. He was hypotensive in ED with BP 83/50.  Placed on 3 L [place], given 3 L LR, Rocephin , Zithromax , KCL IV.  Pertinent  Medical History  SAH (Sep 2024), frequent falls, dementia, PAF (paroxysmal atrial fibrillation), CAD S/P LAD stent 2022, HTN, hypothyroidism, mixed conductive and sensorineural hearing loss, bilateral, asthma, anemia of chronic illness, severe protein-calorie malnutrition, urinary retention, dyslipidemia, GERD, depression, anxiety  Significant Hospital Events: Including procedures, antibiotic start and stop dates in addition to other pertinent events   12/31 Admitted to ICU. On low dose pressors  Interim History / Subjective:  Admitted to ICU overnight. On low dose pressors  Objective   Blood pressure (!) 100/58, pulse (!) 46, temperature 98.3 F (36.8 C), temperature source Oral, resp. rate 17, height 6' (1.829 m), weight 53.9 kg, SpO2 93%.        Intake/Output Summary (Last 24 hours) at 05/10/2023 0931 Last data filed at 05/10/2023 9275 Gross per 24 hour  Intake 5210.66 ml  Output 600 ml  Net 4610.66 ml   Filed Weights   05/09/23 1515 05/10/23 0200  Weight: 57 kg 53.9 kg   Physical Exam: General: Elderly chronically ill-appearing, no acute distress,  encephalopathic HENT: [place], AT, OP clear, dry MM Eyes: EOMI, no scleral icterus Respiratory: Diminished to auscultation bilaterally.  No crackles, wheezing or rales Cardiovascular: RRR, -M/R/G, no JVD GI: BS+, soft, nontender Extremities:-Edema,-tenderness Neuro: Eyes open, encephalopathic, CNII-XII grossly intact Skin: DTI on heels, sacrum and bilateral hips. Covered with bandages GU: Foley in place  CXR 12/31 LLL infiltrate  Resolved Hospital Problem list     Assessment & Plan:   Septic shock with lactic acidosis secondary to LLL pneumonia. COVID-19 +/- aspiration pneumonia with hx of dysphagia, UTI RVP + Covid. Procal 4.42. Lactic acid cleared. MRSA screen + Increasing levophed  requirement to 7 mcg/min -S/p 2L LR. Give additional 500 cc now -Continue LR @150cc  -Wean peripheral levophed  for MAP goal >65 -S/p Vanc x1 in ED. Continue Rocephin , Azithro, Remdesivir   -F/u blood, sputum cultures, urine antigen -Dexamethasone  -Trend procal -Droplet isolation -Aspiration precautions  Acute hypoxemic respiratory failure 2/2 pneumonia -Wean O2 for goal >88%. Currently on 5L -Pulm hygiene: Duoneb, IS -Abx above  Metabolic encephalopathy due to sepsis, hypoxemia, hypernatremia Hypernatremia Ammonia wnl -F/u TSH, free T4 -BMET q6h -Place NGT. Start FWF  Addendum: Unsuccessful attempts at bedside NGT placement. Will order D5 gtt and order for IR fluoro placement of feeding tube  AKI - resolved -S/p IVF resuscitation -Trend UOP/Cr -Avoid nephrotoxins  PAF: controlled rate. CAD S/P LAD stent 2022, HTN, dyslipidemia. Echo EF 50% August 2024 -On amio at home -ASA when stable   Hypothyroidism -Resume home meds: synthroid  175 mcg  Asthma -Duoneb PRN  Anemia of chronic illness and Fe def based on August 2024 labs -Monitor CBC -Hold home iron pills  Severe protein-calorie malnutrition -Dietitian  consult -Plan to feed once NGT placed  Urinary retention/BPH -Monitor UOP  and bladder volume  -Hold flomax due to hypotension -Foley in place  GERD -H2B  Depression, anxiety, dementia -Resume home meds when appropriate   DNR/DNI -Confirmed with his daughter -Discussed GOC and how aggressive patient would want to pursue care. She would like to time to consider. Will continue peripheral vasopressors for now  DTI both heels/sacrum/bilateral hips -Wound care consult   Best Practice (right click and Reselect all SmartList Selections daily)   Diet/type: tubefeeds DVT prophylaxis prophylactic heparin   Pressure ulcer(s): present on admission  GI prophylaxis: H2B Lines: N/A Foley:  Yes, and it is still needed Code Status:  DNR Last date of multidisciplinary goals of care discussion []   Critical care time: 45 min     The patient is critically ill with multiple organ systems failure and requires high complexity decision making for assessment and support, frequent evaluation and titration of therapies, application of advanced monitoring technologies and extensive interpretation of multiple databases.  Independent Critical Care Time: 45 Minutes.   Slater Staff, M.D. [facility] Pulmonary/Critical Care Medicine 05/10/2023 9:31 AM   Please see Amion for pager number to reach on-call Pulmonary and Critical Care Team.

## 2023-05-10 NOTE — Progress Notes (Signed)
 Initial Nutrition Assessment  DOCUMENTATION CODES:   Severe malnutrition in context of chronic illness  INTERVENTION:  Should patient not go comfort care and EN support is nutrition goal. Initiate tube feeding via NG once placed and verified by x-ray: Osmolite 1.5 at 74ml/hr ml/h (1200 ml per day) Start at 20ml/hr increase rate by 10ml every 8 hours till goal of 43ml/hr is met.  Prosource TF20 60 ml daily  Provides 1880 kcal, 95 gm protein, 914 ml free water  daily  Monitor magnesium , potassium, and phosphorus BID for at least 3 days, MD to replete as needed, as pt is at risk for refeeding syndrome given declined oral intake. Add Thiamine  100 mg daily for 7 days   NUTRITION DIAGNOSIS:   Severe Malnutrition related to chronic illness as evidenced by NPO status, severe fat depletion, severe muscle depletion.    GOAL:   Patient will meet greater than or equal to 90% of their needs    MONITOR:   TF tolerance, Diet advancement  REASON FOR ASSESSMENT:   Consult Enteral/tube feeding initiation and management, Assessment of nutrition requirement/status  ASSESSMENT:  89 y.o. M, presented fro SNF with hypoxia 88% on RA, general weakness, AMS, fever 101 F, and odor to urine. Admitting with Sepsis and lactic acidosis due to LLL PNA, COVID-19, ME. PMH: Prostate cancer, HLD, COPD, HTN, LVH, NSTEMI, CAD, PAF, dementia, asthma, GERD, severe malnutrition, complete hx EMR. Review of weights revealed 14% weight change in approximately one year.  Unable to speak with pt on this day. All information obtained through EMR and team.  Review of EMR revealed. Severe malnutrition, declined oral intake. NFPE(12/19/22)- severe fat and muscle depletion. Suspect patient meets criteria for Severe malnutrition in the context of chronic disease, due to severe muscle and fat depletion, declined oral intake, not meeting estimated nutritional goals, and declining weight, NPO status.   RN reports possible comfort  measures pending MD's rounds.   Patient is currently DNR. Admit weight: 57 kg Current weight: 53.9 kg Weight history: 05/10/23 53.9 kg  01/19/23 56.1 kg  12/14/22 58.2 kg  10/11/22 60.9 kg  09/30/22 60.6 kg  08/03/22 62.6 kg  07/20/22 62.1 kg  07/08/22 62.6 kg  06/30/22 62.6 kg      Average Meal Intake: NPO  Nutritionally Relevant Medications: Scheduled Meds:  docusate sodium   100 mg Oral BID   levothyroxine   175 mcg Oral QAC breakfast   multivitamin with minerals  1 tablet Oral Q breakfast    Continuous Infusions:   sodium chloride  10 mL/hr at 05/10/23 9275   azithromycin      norepinephrine  (LEVOPHED ) Adult infusion 5 mcg/min (05/10/23 0722)    Labs Reviewed: Na; 155 H CBG ranges from 134-136 mg/dL over the last 24 hours     NUTRITION - FOCUSED PHYSICAL EXAM:  Deferred   Diet Order:   Diet Order             Diet NPO time specified  Diet effective now                   EDUCATION NEEDS:   Not appropriate for education at this time  Skin:  Skin Assessment: Skin Integrity Issues: Skin Integrity Issues:: Stage II, DTI DTI: Right and L heel, L foot toe, sacrum Stage II: penis  Last BM:  PTA  Height:   Ht Readings from Last 1 Encounters:  05/09/23 6' (1.829 m)    Weight:   Wt Readings from Last 1 Encounters:  05/10/23 53.9 kg  Ideal Body Weight:     BMI:  Body mass index is 16.12 kg/m.  Estimated Nutritional Needs:   Kcal:  1750-2000 kcal  Protein:  85-115 g/d  Fluid:  68ml/kcal    Jenna Pew RDN, LDN Clinical Dietitian   If unable to reach, please contact RD Inpatient secure chat group between 8 am-4 pm daily

## 2023-05-10 NOTE — Consult Note (Signed)
 Consultation Note Date: 05/10/2023   Patient Name: James Kneeland Sr.  DOB: 05/18/1931  MRN: [id number]  Age / Sex: 89 y.o., male   PCP: Sherre Clapper, MD Referring Physician: Kassie Acquanetta Bradley, MD  Reason for Consultation: Establishing goals of care     Chief Complaint/History of Present Illness:   Patient is an 89 year old male with a past medical history of subarachnoid hemorrhage (01/2023), frequent falls, paroxysmal A-fib, CAD status post LAD 2022, hypertension, hypothyroidism, mixed conductive and sensorineural hearing loss, asthma, anemia of chronic disease, urinary retention, dyslipidemia, GERD, depression/anxiety, and severe protein calorie malnutrition who was admitted on 05/09/2023 from SNF for management of hypoxia of 88% on room air, generalized weakness, altered mental status, and fever.  During hospitalization patient has required admission to the ICU for management of septic shock secondary to left lower lobe pneumonia in setting of COVID-positive and possible aspiration pneumonia with known history of dysphagia.  Patient also receiving management for urinary tract infection, AKI, urinary retention, and DTI on heels/sacrum/bilateral hips that were reported to be present on admission.  Palliative medicine team consulted to assist with complex medical decision making.  Extensive review of EMR prior to presenting to bedside.  At time of EMR review patient noted to be on Levophed  for hypotension support.  Discussed with RN regarding medical updates.  Patient had already had 2 attempts at NG tube placement earlier today without success.  Patient remains on isolation secondary to COVID.  Patient remains lethargic and minimally interactive.  Presented to bedside to meet with patient.  Patient's daughter and son-in-law present at bedside.  Able to introduce myself and the role of the palliative medicine team in patient's care.  Able to examine patient at bedside.  Patient only groaning at  times when moving blanket to examine arms and legs.  Patient will not engage with this provider and remains lethargic during examination.  With permission, able to meet with patient's daughter and son-in-law separately to allow patient to rest.  When meeting with patient's daughter/reported HCPOA and son-in-law, able to spend time reviewing patient's medical illness.  Discussed trajectory from subarachnoid hemorrhage in September, PEG tube placement, rehab, and eventually being able to tolerate diet and sitting up in the wheelchair to current situation of deterioration in setting of pneumonia and COVID.  Patient's daughter did describe that patient had worked out his own PEG tube 3 times in the nursing home and finally it was left out the third time as could not reinsert without further trauma.  Spent time learning about patient's quality of life prior to this hemorrhage.  Daughter described that patient lives alone and was incredibly independent.  Daughter denied prior diagnosis of dementia.  Discussed patient has suffered from delirium secondary to acute medical illnesses and hospitalizations which daughter agreed with.  With permission, able to discuss possible pathways for medical care moving forward.  At this time, patient receiving aggressive medical interventions.  Discussed patient receiving life support with pressor support for hypotension.  Family had heard that should patient's pressor support continue to rise, would need central line placed.  Family has stated they would not want patient to go through central line placement trauma.  Acknowledges.  We also discussed feeding tube placement.  Plan was for IR to place feeding tube tomorrow.  After extensive discussion, family noted that patient would not find a feeding tube comfortable.  Patient would want to be able to eat for pleasure and enjoy.  Discussed can allow for comfort feeds  understanding patient is high aspiration risk.  Daughter and  son-in-law acknowledged this and noted that if patient is awake enough to engage with them, can try ice chips or even frosty since that is patient's favorite food.  Discussed will not place feeding tube as does not support quality of life for the patient.  Discussed that should patient further deteriorate, that is his body saying he is tired which son-in-law acknowledges has been happening.  Discussed that should patient medically deteriorate, would recommend they be called to bedside and patient be transition to full comfort focused care at that time.  Discussed comfort focused care would entail providing medications for end-of-life management and not providing a life prolonging interventions.  Family agreeing with plan to continue current level of medical interventions without escalation and should patient deteriorate, transition to comfort focused care.  Will allow patient to eat for comfort understanding aspiration risk.  Updated IDT including PCCM provider and RN regarding conversation and plans for medical care moving forward.  PCCM provider to communicate with overnight coverage about maintaining current level of care and not escalating care.  IR consult for NG tube placement will be canceled.  Should patient deteriorate, family should be called to present to bedside and then consider transition to comfort focused care.  Primary Diagnoses  Present on Admission:  PAF (paroxysmal atrial fibrillation) (HCC)  Other fatigue  Ischemic cardiomyopathy  Asthma-COPD overlap syndrome (HCC)  Acquired hypothyroidism  Sepsis (HCC)   Past Medical History:  Diagnosis Date   Acquired hypothyroidism 08/15/2019   Acute systolic heart failure (HCC) 06/25/2020   Anemia 02/05/2020   boarderline anemic   Atopic dermatitis 12/05/2019   Bacterial pneumonia, unspecified 06/14/2012   06/14/2012 CXR w/ RML PNA >Zithromax  and Rocephin      Bradycardia 11/28/2018   CAD (coronary artery disease) 07/07/2020   Cancer (HCC)     prostate cancer-seed implant   Closed fracture dislocation of right elbow    Closed fracture of right olecranon process 04/10/2020   COPD (chronic obstructive pulmonary disease) (HCC) 01/21/2020   Dermatitis 02/05/2020   Dizziness 02/19/2016   Essential hypertension 11/28/2018   History of prostate cancer    seed implant   Hyperlipemia    Hyperlipidemia, mixed 08/21/2008   Qualifier: Diagnosis of  By: Neysa MD, Clinton D    LVH (left ventricular hypertrophy) 11/28/2018   Malnutrition of moderate degree (HCC) 08/15/2019   Mixed conductive and sensorineural hearing loss, 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   Moderate persistent asthma, uncomplicated 01/21/2020   Nonsustained ventricular tachycardia (HCC) 01/30/2019   NSTEMI (non-ST elevated myocardial infarction) (HCC) 06/21/2020   Other fatigue 02/05/2020   PAF (paroxysmal atrial fibrillation) (HCC) 06/25/2020   Seasonal and perennial allergic rhinitis 05/17/2007       Senile osteoporosis 08/15/2019   SINUSITIS, ACUTE 06/28/2007   Qualifier: Diagnosis of  By: Orlie NP, Tammy     Ventricular ectopy 11/28/2018   Social History   Socioeconomic History   Marital status: Married    Spouse name: Elveria   Number of children: 1   Years of education: Not on file   Highest education level: Not on file  Occupational History   Occupation: Retired    Associate Professor: OTHER    Comment: Furniture [employer]  Tobacco Use   Smoking status: Former    Current packs/day: 0.00    Types: Cigarettes    Quit date: 1960    Years since quitting: 65.0   Smokeless tobacco: Former    Types: Sports Administrator  Quit date: 2011  Vaping Use   Vaping status: Never Used  Substance and Sexual Activity   Alcohol  use: No   Drug use: No   Sexual activity: Not Currently  Other Topics Concern   Not on file  Social History Narrative   Not on file   Social Drivers of Health   Financial Resource Strain: Low Risk  (03/30/2022)   Overall Financial Resource Strain (CARDIA)    Difficulty of Paying  Living Expenses: Not hard at all  Food Insecurity: Unknown (05/10/2023)   Hunger Vital Sign    Worried About Running Out of Food in the Last Year: Patient unable to answer    Ran Out of Food in the Last Year: Never true  Transportation Needs: Patient Unable To Answer (05/10/2023)   PRAPARE - Transportation    Lack of Transportation (Medical): Patient unable to answer    Lack of Transportation (Non-Medical): Patient unable to answer  Physical Activity: Insufficiently Active (01/29/2021)   Exercise Vital Sign    Days of Exercise per Week: 7 days    Minutes of Exercise per Session: 10 min  Stress: No Stress Concern Present (01/29/2021)   [employer] of Occupational Health - Occupational Stress Questionnaire    Feeling of Stress : Not at all  Social Connections: Socially Integrated (01/29/2021)   Social Connection and Isolation Panel [NHANES]    Frequency of Communication with Friends and Family: Three times a week    Frequency of Social Gatherings with Friends and Family: Three times a week    Attends Religious Services: 1 to 4 times per year    Active Member of Clubs or Organizations: Yes    Attends Banker Meetings: 1 to 4 times per year    Marital Status: Married   Family History  Problem Relation Age of Onset   Alzheimer's disease Father    Diabetes Mother    Scheduled Meds:  Chlorhexidine  Gluconate Cloth  6 each Topical Daily   dexamethasone  (DECADRON ) injection  6 mg Intravenous Q24H   docusate sodium   100 mg Oral BID   famotidine   20 mg Oral Daily   free water   200 mL Per Tube Q4H   heparin   5,000 Units Subcutaneous Q8H   insulin  aspart  1-3 Units Subcutaneous Q4H   levothyroxine   175 mcg Oral QAC breakfast   multivitamin with minerals  1 tablet Oral Q breakfast   polyethylene glycol  17 g Oral Daily   Continuous Infusions:  sodium chloride  Stopped (05/10/23 0959)   azithromycin      cefTRIAXone  (ROCEPHIN )  IV     dextrose  75 mL/hr at 05/10/23 1239    norepinephrine  (LEVOPHED ) Adult infusion 7 mcg/min (05/10/23 1024)   remdesivir  100 mg in sodium chloride  0.9 % 100 mL IVPB Stopped (05/10/23 1025)   PRN Meds:.acetaminophen , guaiFENesin -dextromethorphan, hydrocortisone , ipratropium-albuterol , lip balm, sodium chloride  Allergies  Allergen Reactions   Clarithromycin Other (See Comments)    GI upset- Allergic, per MAR   CBC:    Component Value Date/Time   WBC 17.6 (H) 05/10/2023 0313   HGB 9.1 (L) 05/10/2023 0313   HGB 12.1 (L) 07/08/2022 1154   HCT 30.1 (L) 05/10/2023 0313   HCT 36.8 (L) 07/08/2022 1154   PLT 183 05/10/2023 0313   PLT 209 07/08/2022 1154   MCV 94.4 05/10/2023 0313   MCV 96 07/08/2022 1154   NEUTROABS 23.1 (H) 05/09/2023 1527   NEUTROABS 6.4 07/08/2022 1154   LYMPHSABS 0.6 (L) 05/09/2023 1527   LYMPHSABS  1.4 07/08/2022 1154   MONOABS 0.6 05/09/2023 1527   EOSABS 0.1 05/09/2023 1527   EOSABS 0.4 07/08/2022 1154   BASOSABS 0.1 05/09/2023 1527   BASOSABS 0.1 07/08/2022 1154   Comprehensive Metabolic Panel:    Component Value Date/Time   NA 155 (H) 05/10/2023 0313   NA 134 07/08/2022 1154   K 3.8 05/10/2023 0313   CL 125 (H) 05/10/2023 0313   CO2 24 05/10/2023 0313   BUN 34 (H) 05/10/2023 0313   BUN 16 07/08/2022 1154   CREATININE 1.19 05/10/2023 0313   GLUCOSE 136 (H) 05/10/2023 0313   CALCIUM  7.8 (L) 05/10/2023 0313   AST 28 05/09/2023 1527   ALT 23 05/09/2023 1527   ALKPHOS 65 05/09/2023 1527   BILITOT 0.8 05/09/2023 1527   BILITOT <0.2 07/08/2022 1154   PROT 6.8 05/09/2023 1527   PROT 6.8 07/08/2022 1154   ALBUMIN 2.2 (L) 05/09/2023 1527   ALBUMIN 4.4 07/08/2022 1154    Physical Exam: Vital Signs: BP (!) 113/50   Pulse (!) 46   Temp (!) 97.2 F (36.2 C) (Axillary)   Resp (!) 28   Ht 6' (1.829 m)   Wt 53.9 kg   SpO2 100%   BMI 16.12 kg/m  SpO2: SpO2: 100 % O2 Device: O2 Device: Nasal Cannula O2 Flow Rate: O2 Flow Rate (L/min): 5 L/min Intake/output summary:  Intake/Output Summary  (Last 24 hours) at 05/10/2023 1449 Last data filed at 05/10/2023 1139 Gross per 24 hour  Intake 5393.65 ml  Output 800 ml  Net 4593.65 ml   LBM: Last BM Date : 05/09/23 Baseline Weight: Weight: 57 kg Most recent weight: Weight: 53.9 kg  General: Lethargic, cachectic, frail, ill-appearing Cardiovascular: RRR Respiratory:  increased work of breathing noted Skin: Multiple ecchymoses present on upper and lower extremities b/l Neuro: Lethargic          Palliative Performance Scale: 10%              Additional Data Reviewed: Recent Labs    05/09/23 1527 05/10/23 0313  WBC 24.5* 17.6*  HGB 11.6* 9.1*  PLT 291 183  NA 159* 155*  BUN 43* 34*  CREATININE 1.37* 1.19    Imaging: DG Chest Port 1 View CLINICAL DATA:  Sepsis  EXAM: PORTABLE CHEST 1 VIEW  COMPARISON:  X-ray 01/09/2023 and older.  FINDINGS: Hyperinflation. New consolidative patchy opacities along the left lung base. Question tiny effusion. No pneumothorax or edema. Normal cardiopericardial silhouette with calcified aorta. Osteopenia with degenerative changes of the spine and shoulder. Elevated right humeral head. Please correlate for rotator cuff tear. Presumed coronary stents.  IMPRESSION: Consolidative patchy opacity at the left lung base. Possible infiltrate or pneumonia based on history. Recommend follow up to confirm clearance.  Electronically Signed   By: Ranell Bring M.D.   On: 05/09/2023 17:47    I personally reviewed recent imaging.   Palliative Care Assessment and Plan Summary of Established Goals of Care and Medical Treatment Preferences   Patient is an 89 year old male with a past medical history of subarachnoid hemorrhage (01/2023), frequent falls, paroxysmal A-fib, CAD status post LAD 2022, hypertension, hypothyroidism, mixed conductive and sensorineural hearing loss, asthma, anemia of chronic disease, urinary retention, dyslipidemia, GERD, depression/anxiety, and severe protein calorie  malnutrition who was admitted on 05/09/2023 from SNF for management of hypoxia of 88% on room air, generalized weakness, altered mental status, and fever.  During hospitalization patient has required admission to the ICU for management of septic shock secondary to  left lower lobe pneumonia in setting of COVID-positive and possible aspiration pneumonia with known history of dysphagia.  Patient also receiving management for urinary tract infection, AKI, urinary retention, and DTI on heels/sacrum/bilateral hips that were reported to be present on admission.  Palliative medicine team consulted to assist with complex medical decision making.  # Complex medical decision making/goals of care  -Patient unable to engage in complex medical decision-making due to current medical status.  -Extensive discussion with patient's daughter/reported HCPOA and son-in-law as detailed above in HPI.  Discussed possible pathways for medical care moving forward.  Discussed considering what patient would consider quality of life.  After discussion at this time we will continue current level of medical interventions.  Will not add additional pressor support.  Would not obtain central line for pressor support.  Will not obtain feeding tube.  Allowing time for outcomes while continuing current care.  Should patient deteriorate, family needs to be called so they can present to bedside and patient can be transition to comfort focused care at that time.  Patient will be allowed to eat for comfort if awake enough with family acknowledging aspiration risk.  Palliative medicine team will continue to follow along with patient's care to engage in conversations as able.  -  Code Status: Limited: Do not attempt resuscitation (DNR) -DNR-LIMITED -Do Not Intubate/DNI    # Symptom management  -Currently continuing current medical interventions without escalation of care.  Should patient medically deteriorate and appears to be reaching end-of-life,  please call family to bedside and then would transition to comfort focused care.  # Psycho-social/Spiritual Support:  - Support System: Daughter, son-in-law, wife (multiple medical conditions including stroke preventing engagement in complex medical decision making), sister  # Discharge Planning:  To Be Determined  Thank you for allowing the palliative care team to participate in the care [facility] GomezSABRA Tinnie Radar, DO Palliative Care Provider PMT # [phone]  If patient remains symptomatic despite maximum doses, please call PMT at [phone] between 0700 and 1900. Outside of these hours, please call attending, as PMT does not have night coverage.  Personally spent 87 minutes in patient care including extensive chart review (labs, imaging, progress/consult notes, vital signs), medically appropraite exam, discussed with treatment team, education to patient, family, and staff, documenting clinical information, medication review and management, coordination of care, and available advanced directive documents.   *Please note that this is a verbal dictation therefore any spelling or grammatical errors are due to the Dragon [employer] One system interpretation.

## 2023-05-10 NOTE — Evaluation (Signed)
 Clinical/Bedside Swallow Evaluation Patient Details  Name: James Lipford Sr. MRN: [id number] Date of Birth: Oct 09, 1931  Today's Date: 05/10/2023 Time: SLP Start Time (ACUTE ONLY): 1228 SLP Stop Time (ACUTE ONLY): 1248 SLP Time Calculation (min) (ACUTE ONLY): 20 min  Past Medical History:  Past Medical History:  Diagnosis Date   Acquired hypothyroidism 08/15/2019   Acute systolic heart failure (HCC) 06/25/2020   Anemia 02/05/2020   boarderline anemic   Atopic dermatitis 12/05/2019   Bacterial pneumonia, unspecified 06/14/2012   06/14/2012 CXR w/ RML PNA >Zithromax  and Rocephin      Bradycardia 11/28/2018   CAD (coronary artery disease) 07/07/2020   Cancer (HCC)    prostate cancer-seed implant   Closed fracture dislocation of right elbow    Closed fracture of right olecranon process 04/10/2020   COPD (chronic obstructive pulmonary disease) (HCC) 01/21/2020   Dermatitis 02/05/2020   Dizziness 02/19/2016   Essential hypertension 11/28/2018   History of prostate cancer    seed implant   Hyperlipemia    Hyperlipidemia, mixed 08/21/2008   Qualifier: Diagnosis of  By: Neysa MD, Clinton D    LVH (left ventricular hypertrophy) 11/28/2018   Malnutrition of moderate degree (HCC) 08/15/2019   Mixed conductive and sensorineural hearing loss, 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   Moderate persistent asthma, uncomplicated 01/21/2020   Nonsustained ventricular tachycardia (HCC) 01/30/2019   NSTEMI (non-ST elevated myocardial infarction) (HCC) 06/21/2020   Other fatigue 02/05/2020   PAF (paroxysmal atrial fibrillation) (HCC) 06/25/2020   Seasonal and perennial allergic rhinitis 05/17/2007       Senile osteoporosis 08/15/2019   SINUSITIS, ACUTE 06/28/2007   Qualifier: Diagnosis of  By: Orlie NP, Tammy     Ventricular ectopy 11/28/2018   Past Surgical History:  Past Surgical History:  Procedure Laterality Date   APPENDECTOMY     CORONARY ATHERECTOMY N/A 06/24/2020   Procedure: CORONARY ATHERECTOMY;  Surgeon: Darron Deatrice LABOR,  MD;  Location: [facility];  Service: Cardiovascular;  Laterality: N/A;   CORONARY ULTRASOUND/IVUS N/A 06/24/2020   Procedure: Intravascular Ultrasound/IVUS;  Surgeon: Darron Deatrice LABOR, MD;  Location: [facility];  Service: Cardiovascular;  Laterality: N/A;   IR GASTROSTOMY TUBE MOD SED  01/10/2023   LEFT HEART CATH AND CORONARY ANGIOGRAPHY N/A 06/22/2020   Procedure: LEFT HEART CATH AND CORONARY ANGIOGRAPHY;  Surgeon: Mady Bruckner, MD;  Location: [facility];  Service: Cardiovascular;  Laterality: N/A;   ORIF ELBOW FRACTURE Right 04/10/2020   Procedure: OPEN REDUCTION INTERNAL FIXATION (ORIF) ELBOW/OLECRANON FRACTURE;  Surgeon: Josefina Chew, MD;  [facility]: [facility];  Service: Orthopedics;  Laterality: Right;   PR INSJ TEMP NDWELLG BLADDER CATHETER COMPLICATED  01/12/2023   HPI:  89 y.o. male who presented from SNF with AMS, hypoxia. Dx septic shock, LLL pna, COVID 19, metabolic encephalopathy. PMX: [facility] 12/2022 with prolonged hospitalization, frequent falls, dementia, CAD S/P LAD stent 2022, HTN, hypothyroidism, mixed conductive and sensorineural hearing loss, asthma, anemia of chronic illness, severe protein-calorie malnutrition, urinary retention, dyslipidemia, GERD, depression, and anxiety, MBS studies 8/21 and 01/04/23, recs for NPO. PEG 01/10/23, pulled out x3. Per dtr, had FEES at [facility], has been eating mech soft/thins.  Dependence for feeding.    Assessment / Plan / Recommendation  Clinical Impression  [name] participated in limited clinical swallowing evaluation. Daughter and son-in-law at the bedside. [name] provided information re: swallowing PTA.  [name], who has bilateral hearing loss (HA not available) was not able to follow commands; there was no eye contact nor  attempt to communicate.  Oral mechanism exam was difficult - resistant to oral care/suctioning.  He was retaining oral secretions.    Provided with ice chips, 1/2 teaspoons of water ,  1/2 teaspoons of applesauce. There was limited awareness of approaching bolus material; unable to use a straw; poor lip seal and minimal active manipulation of any POs.  A  pharyngeal swallow was only occasionally  palpated; it was subjectively delayed.  He did not verbalize/phonate throughout assessment.  At end of session, oral suctioning removed applesauce residue from within his mouth.    His mentation won't allow POs at this point. D/W [name] the impact of encephalopathy on his swallowing - he is not able to functionally eat/drink.  Recommend he be provided with occasional ice chips after oral care and when he is alert. SLP will follow for re-assessment as his mentation permits. D/W RN.  The purpose of ice for this pt is not only comfort, but to keep pharynx healthy and moist. Pooled secretions in an NPO pt can thicken and create harmful persistent secretions that pt can't mobilize. These can even form mucus plugs. A few ice chips, given after oral care, can loosen secretions for pt to clear and maintain airway hygiene. Also can preserve some swallow function. Minimal ice is not expected to be harmful to patient even if some moisture is aspirated.   SLP Visit Diagnosis: Dysphagia, oropharyngeal phase (R13.12)    Aspiration Risk    Pt has multiple risk factors creating susceptibility to aspiration pna:  Poor health, reduced cognitive function, limited mobility, frailty, dependence for feeding and oral hygiene, reduced saliva.   Diet Recommendation   NPO       Other  Recommendations Oral Care Recommendations: Oral care prior to ice chip/H20    Recommendations for follow up therapy are one component of a multi-disciplinary discharge planning process, led by the attending physician.  Recommendations may be updated based on patient status, additional functional criteria and insurance authorization.  Follow up Recommendations Other (comment) (tba)      Assistance Recommended at Discharge     Functional Status Assessment Patient has had a recent decline in their functional status and demonstrates the ability to make significant improvements in function in a reasonable and predictable amount of time.  Frequency and Duration min 2x/week  2 weeks       Prognosis Prognosis for improved oropharyngeal function: Guarded      Swallow Study   General Date of Onset: 05/09/23 HPI: 89 y.o. male who presented from SNF with AMS, hypoxia. Dx septic shock, LLL pna, COVID 19, metabolic encephalopathy. PMX: [facility] 12/2022 with prolonged hospitalization, frequent falls, dementia, CAD S/P LAD stent 2022, HTN, hypothyroidism, mixed conductive and sensorineural hearing loss, asthma, anemia of chronic illness, severe protein-calorie malnutrition, urinary retention, dyslipidemia, GERD, depression, and anxiety, MBS studies 8/21 and 01/04/23, recs for NPO. PEG 01/10/23, pulled out x3. Per dtr, had FEES at [facility], has been eating mech soft/thins.  Dependence for feeding. Type of Study: Bedside Swallow Evaluation Previous Swallow Assessment: see HPI Diet Prior to this Study: NPO Temperature Spikes Noted: No Respiratory Status: Nasal cannula (5l) History of Recent Intubation: No Behavior/Cognition: Distractible;Lethargic/Drowsy Oral Cavity Assessment: Dried secretions Oral Care Completed by SLP: Yes Oral Cavity - Dentition: Missing dentition Self-Feeding Abilities: Total assist Patient Positioning: Upright in bed Baseline Vocal Quality: Not observed Volitional Cough: Cognitively unable to elicit Volitional Swallow: Unable to elicit    Oral/Motor/Sensory Function Overall Oral Motor/Sensory Function: Other (comment) (symmetric at baseline)  Ice Chips Ice chips: Impaired Presentation: Spoon Oral Phase Impairments: Reduced labial seal;Poor awareness of bolus Oral Phase Functional Implications: Prolonged oral transit;Oral holding Pharyngeal Phase Impairments: Suspected delayed Swallow   Thin Liquid Thin  Liquid: Impaired Presentation: Spoon Oral Phase Impairments: Poor awareness of bolus;Reduced labial seal Oral Phase Functional Implications: Oral holding Pharyngeal  Phase Impairments: Suspected delayed Swallow    Nectar Thick Nectar Thick Liquid: Not tested   Honey Thick Honey Thick Liquid: Not tested   Puree Puree: Impaired Presentation: Spoon Oral Phase Impairments: Poor awareness of bolus Oral Phase Functional Implications: Oral residue Pharyngeal Phase Impairments: Unable to trigger swallow   Solid     Solid: Not tested      Vona Palma Laurice 05/10/2023,1:01 PM  Palma L. Vona, MA CCC/SLP Clinical Specialist - Acute Care SLP Acute Rehabilitation Services Office number [phone]

## 2023-05-10 NOTE — Progress Notes (Addendum)
 eLink Physician-Brief Progress Note Patient Name: James Lortie Sr. DOB: 06/03/1931 MRN: [id number]   Date of Service  05/10/2023  HPI/Events of Note  89 year old with a history of recent subarachnoid hemorrhage in 2024, frequent falls, dementia and coronary artery disease and protein calorie malnutrition who presents to the emergency department with hypoxia at 88% on room air, generalized weakness, and acute on chronic encephalopathy with fevers.  In the emergency department he is noted to be hypotensive, given broad-spectrum antibiotics, and noted to have septic shock secondary to lower lobe pneumonia with possible aspiration.  On examination, the patient is tachypneic, hypoxic to 81% on low-flow nasal cannula, and mildly hypotensive.  Results consistent with hypernatremia, hypokalemia, elevated creatinine and 6.2 lactate.  Leukocytosis to 24.5.  COVID-positive.  Left lower lobe consolidation on chest radiograph  eICU Interventions  Maintain antibiotics per primary team.  Add procalcitonin trend  Decadron  and remdesivir  for COVID.  In isolation.  Agree with palliative care consultation.  Continue LR infusion  DVT prophylaxis with heparin  GI prophylaxis with home famotidine    0600 -remains hypotensive with maps 55-60s.  Poor IV access.  Hypernatremia improved, lactic acid improved, will initiate peripheral norepinephrine  as needed.  Will need to evaluate for ultrasound-guided PIV.  Intervention Category Evaluation Type: New Patient Evaluation  Brendalyn Vallely 05/10/2023, 12:38 AM

## 2023-05-10 NOTE — Progress Notes (Signed)
 PT is not appropriate for a Flutter device at this time.

## 2023-05-10 NOTE — Progress Notes (Signed)
 PCCM Progress Note  Palliative team discussed with family goals of care. No CVC or feeding tube desired. If patient has increasing pressor needs then would want to be notified and discuss transitioning to comfort care. If patient awakens and wants food, family would be ok with PO intake. They have expressed understanding risk of aspiration in his current critical condition.

## 2023-05-11 DIAGNOSIS — R4589 Other symptoms and signs involving emotional state: Secondary | ICD-10-CM | POA: Diagnosis not present

## 2023-05-11 DIAGNOSIS — J690 Pneumonitis due to inhalation of food and vomit: Secondary | ICD-10-CM | POA: Diagnosis not present

## 2023-05-11 DIAGNOSIS — A419 Sepsis, unspecified organism: Secondary | ICD-10-CM | POA: Diagnosis not present

## 2023-05-11 DIAGNOSIS — Z515 Encounter for palliative care: Secondary | ICD-10-CM

## 2023-05-11 DIAGNOSIS — Z66 Do not resuscitate: Secondary | ICD-10-CM | POA: Diagnosis not present

## 2023-05-11 DIAGNOSIS — R627 Adult failure to thrive: Secondary | ICD-10-CM | POA: Diagnosis not present

## 2023-05-11 DIAGNOSIS — Z7189 Other specified counseling: Secondary | ICD-10-CM | POA: Diagnosis not present

## 2023-05-11 DIAGNOSIS — E43 Unspecified severe protein-calorie malnutrition: Secondary | ICD-10-CM | POA: Diagnosis not present

## 2023-05-11 DIAGNOSIS — L8990 Pressure ulcer of unspecified site, unspecified stage: Secondary | ICD-10-CM | POA: Insufficient documentation

## 2023-05-11 LAB — CBC
HCT: 29.7 % — ABNORMAL LOW (ref 39.0–52.0)
Hemoglobin: 9 g/dL — ABNORMAL LOW (ref 13.0–17.0)
MCH: 28.5 pg (ref 26.0–34.0)
MCHC: 30.3 g/dL (ref 30.0–36.0)
MCV: 94 fL (ref 80.0–100.0)
Platelets: 205 10*3/uL (ref 150–400)
RBC: 3.16 MIL/uL — ABNORMAL LOW (ref 4.22–5.81)
RDW: 15.8 % — ABNORMAL HIGH (ref 11.5–15.5)
WBC: 22.5 10*3/uL — ABNORMAL HIGH (ref 4.0–10.5)
nRBC: 0 % (ref 0.0–0.2)

## 2023-05-11 LAB — BASIC METABOLIC PANEL
Anion gap: 4 — ABNORMAL LOW (ref 5–15)
Anion gap: 4 — ABNORMAL LOW (ref 5–15)
BUN: 35 mg/dL — ABNORMAL HIGH (ref 8–23)
BUN: 34 mg/dL — ABNORMAL HIGH (ref 8–23)
CO2: 21 mmol/L — ABNORMAL LOW (ref 22–32)
CO2: 18 mmol/L — ABNORMAL LOW (ref 22–32)
Calcium: 7.5 mg/dL — ABNORMAL LOW (ref 8.9–10.3)
Calcium: 7.3 mg/dL — ABNORMAL LOW (ref 8.9–10.3)
Chloride: 123 mmol/L — ABNORMAL HIGH (ref 98–111)
Chloride: 123 mmol/L — ABNORMAL HIGH (ref 98–111)
Creatinine, Ser: 0.89 mg/dL (ref 0.61–1.24)
Creatinine, Ser: 0.86 mg/dL (ref 0.61–1.24)
GFR, Estimated: >60 mL/min (ref >60)
GFR, Estimated: >60 mL/min (ref >60)
Glucose, Bld: 145 mg/dL — ABNORMAL HIGH (ref 70–99)
Glucose, Bld: 155 mg/dL — ABNORMAL HIGH (ref 70–99)
Potassium: 4 mmol/L (ref 3.5–5.1)
Potassium: 3.4 mmol/L — ABNORMAL LOW (ref 3.5–5.1)
Sodium: 148 mmol/L — ABNORMAL HIGH (ref 135–145)
Sodium: 145 mmol/L (ref 135–145)

## 2023-05-11 LAB — TROPONIN I (HIGH SENSITIVITY): Troponin I (High Sensitivity): 44 ng/L — ABNORMAL HIGH (ref <18)

## 2023-05-11 LAB — GLUCOSE, CAPILLARY
Glucose-Capillary: 132 mg/dL — ABNORMAL HIGH (ref 70–99)
Glucose-Capillary: 136 mg/dL — ABNORMAL HIGH (ref 70–99)
Glucose-Capillary: 142 mg/dL — ABNORMAL HIGH (ref 70–99)

## 2023-05-11 LAB — PROCALCITONIN: Procalcitonin: 4.02 ng/mL

## 2023-05-11 LAB — LEGIONELLA PNEUMOPHILA SEROGP 1 UR AG: L. pneumophila Serogp 1 Ur Ag: NEGATIVE

## 2023-05-11 LAB — MAGNESIUM: Magnesium: 2.3 mg/dL (ref 1.7–2.4)

## 2023-05-11 MED ORDER — LACTATED RINGERS IV SOLN: INTRAVENOUS | Status: DC

## 2023-05-11 MED ORDER — GLYCOPYRROLATE 0.2 MG/ML IJ SOLN
0.2000 mg | INTRAMUSCULAR | Status: DC | PRN
  Administered 2023-05-17 – 2023-05-18 (×2): 0.2 mg via INTRAVENOUS
  Filled 2023-05-11 (×2): qty 1

## 2023-05-11 MED ORDER — SODIUM CHLORIDE 0.9 % IV SOLN
2.0000 g | Freq: Two times a day (BID) | INTRAVENOUS | Status: DC
  Administered 2023-05-11 (×2): 2 g via INTRAVENOUS
  Filled 2023-05-11 (×2): qty 12.5

## 2023-05-11 MED ORDER — POLYVINYL ALCOHOL 1.4 % OP SOLN: 1.0000 [drp] | Freq: Four times a day (QID) | OPHTHALMIC | Status: DC | PRN

## 2023-05-11 MED ORDER — LORAZEPAM 2 MG/ML PO CONC: 1.0000 mg | ORAL | Status: DC | PRN

## 2023-05-11 MED ORDER — HALOPERIDOL LACTATE 5 MG/ML IJ SOLN: 0.5000 mg | INTRAMUSCULAR | Status: DC | PRN

## 2023-05-11 MED ORDER — ONDANSETRON HCL 4 MG/2ML IJ SOLN
4.0000 mg | Freq: Four times a day (QID) | INTRAMUSCULAR | Status: DC | PRN
Start: 2023-05-11 — End: 2023-05-18

## 2023-05-11 MED ORDER — MORPHINE SULFATE (PF) 2 MG/ML IV SOLN
1.0000 mg | INTRAVENOUS | Status: DC | PRN
  Administered 2023-05-12 (×2): 1 mg via INTRAVENOUS
  Filled 2023-05-11 (×2): qty 1

## 2023-05-11 MED ORDER — LORAZEPAM 2 MG/ML IJ SOLN: 1.0000 mg | INTRAMUSCULAR | Status: DC | PRN

## 2023-05-11 MED ORDER — HALOPERIDOL 0.5 MG PO TABS: 0.5000 mg | ORAL_TABLET | ORAL | Status: DC | PRN

## 2023-05-11 MED ORDER — ONDANSETRON 4 MG PO TBDP: 4.0000 mg | ORAL_TABLET | Freq: Four times a day (QID) | ORAL | Status: DC | PRN

## 2023-05-11 MED ORDER — HALOPERIDOL LACTATE 2 MG/ML PO CONC: 0.5000 mg | ORAL | Status: DC | PRN

## 2023-05-11 MED ORDER — GLYCOPYRROLATE 1 MG PO TABS: 1.0000 mg | ORAL_TABLET | ORAL | Status: DC | PRN

## 2023-05-11 MED ORDER — LORAZEPAM 1 MG PO TABS: 1.0000 mg | ORAL_TABLET | ORAL | Status: DC | PRN

## 2023-05-11 MED ORDER — GLYCOPYRROLATE 0.2 MG/ML IJ SOLN: 0.2000 mg | INTRAMUSCULAR | Status: DC | PRN

## 2023-05-11 NOTE — Progress Notes (Signed)
 NAME:  [name]., MRN:  [id number], DOB:  Sep 10, 1931, LOS: 2 ADMISSION DATE:  05/09/2023, CONSULTATION DATE:  05/09/2023 REFERRING MD: Bernard Drivers, MD, CHIEF COMPLAINT: Hypoxia today  History of Present Illness:  A 89 y.o. male with SAH (Sep 2024), frequent falls, dementia, PAF (paroxysmal atrial fibrillation), CAD S/P LAD stent 2022, HTN, hypothyroidism, mixed conductive and sensorineural hearing loss, asthma, anemia of chronic illness, severe protein-calorie malnutrition, urinary retention, dyslipidemia, GERD, depression, and anxiety, who was brought from SNF with hypoxia 88% on RA, general weakness, AMS, fever 101 F, and odor to urine. No reported  trauma/fall. EMS indicates bp soft 104/60, cbg 193. He was hypotensive in ED with BP 83/50.  Placed on 3 L [place], given 3 L LR, Rocephin , Zithromax , KCL IV.  Pertinent  Medical History  SAH (Sep 2024), frequent falls, dementia, PAF (paroxysmal atrial fibrillation), CAD S/P LAD stent 2022, HTN, hypothyroidism, mixed conductive and sensorineural hearing loss, bilateral, asthma, anemia of chronic illness, severe protein-calorie malnutrition, urinary retention, dyslipidemia, GERD, depression, anxiety  Significant Hospital Events: Including procedures, antibiotic start and stop dates in addition to other pertinent events   12/31 Admitted to ICU. On low dose pressors 1/2 plan to slowly transition to comfort care   Interim History / Subjective:  Na is normalized after being on d5 overnight   Objective   Blood pressure 102/67, pulse (!) 46, temperature (!) 97.4 F (36.3 C), temperature source Axillary, resp. rate (!) 23, height 6' (1.829 m), weight 53.9 kg, SpO2 100%.        Intake/Output Summary (Last 24 hours) at 05/11/2023 1105 Last data filed at 05/11/2023 1056 Gross per 24 hour  Intake 2875.38 ml  Output 550 ml  Net 2325.38 ml   Filed Weights   05/09/23 1515 05/10/23 0200 05/11/23 0500  Weight: 57 kg 53.9 kg 53.9 kg   Physical  Exam: General:  Elderly chronically and acutely ill M NAD  HENT: temporal muscle wasting. Poor dentition. Dry mm  Respiratory: Upper rhonchi  Cardiovascular: s1s2  GI: soft thin ndnt  Extremities: no acute joint deformity  Neuro:  Opens eyes to stimulation. Unintelligible speech.  Skin: reported DTIs, bandages in place  GU: Foley     Resolved Hospital Problem list   AKI  Hypernatremia  Assessment & Plan:   Goals of Care DNR status FTT in adult Severe protein calorie malnutrition  Acute metabolic encephalopathy  Acute hypoxic resp failure 2/2 PNA detailed below  Septic shock LLL PNA Covid 19 PNA Aspiration PNA hx dysphagia UTI  pAF CAD s/p LAD stent HTN HLD HFpEF  Hypothyroidism Asthma  Anemia of chronic dz/ Fe def  Urinary retention, chronic foley  Hx mechanical fall Hx rSAH  GERD  Depression/ anxiety  Dementia  DTIs, POA  Hypernatremia, improved  P -DNR/I  -plan is to transition to comfort care in coming days, after visitation is facilitated. - I have not placed comfort care order yet, as some high-acuity care is ongoing to facilitate visitation, however, we are adding PRN comfort focussed care.  -Destination: inpatient comfort care, unless he proves stable off of pressors following their cessation   For 1/2: - cont periph pressors without escalation -cont abx through 1/2.  -fine to cont decadron  through 1/2  -de-escalate labs, decrease fq of BP checks  -add PRNs for comfort (morphine , ativan , glyco, zofran , haldol ).  -iso precautions for covid - dc SQ heparin  -dc synthroid   -he is on d5 for hyperNa which is resolved. Will dc and  order low rate LR (addressing c/f dehydration sx)  -dc SSI / CBG checks  -liberalize visitation  -pleasure feeds  -appreciate palliative care following         Best Practice (right click and Reselect all SmartList Selections daily)   Diet/type: tubefeeds DVT prophylaxis prophylactic heparin   Pressure ulcer(s):  present on admission  GI prophylaxis: H2B Lines: N/A Foley:  Yes, and it is still needed Code Status:  DNR Last date of multidisciplinary goals of care discussion [05/10/23]  Critical care time:     CRITICAL CARE Performed by: Ronnald FORBES Gave   Total critical care time: 45 minutes  Critical care time was exclusive of separately billable procedures and treating other patients. Critical care was necessary to treat or prevent imminent or life-threatening deterioration.  Critical care was time spent personally by me on the following activities: development of treatment plan with patient and/or surrogate as well as nursing, discussions with consultants, evaluation of patient's response to treatment, examination of patient, obtaining history from patient or surrogate, ordering and performing treatments and interventions, ordering and review of laboratory studies, ordering and review of radiographic studies, pulse oximetry and re-evaluation of patient's condition.  Ronnald Gave MSN, AGACNP-BC [facility] Amion for pager  05/11/2023, 11:05 AM

## 2023-05-11 NOTE — Progress Notes (Signed)
 Daily Progress Note   Patient Name: [name] Sr.       Date: 05/11/2023 DOB: [birth year]  Age: 89 y.o. MRN#: [id number] Attending Physician: James Jennet LABOR, [name] Primary Care Physician: James Clapper, [name] Admit Date: 05/09/2023 Length of Stay: 2 days  Reason for Consultation/Follow-up: Establishing goals of care  Subjective:   CC: Patient sleeping comfortably when seen today. Following up regarding complex medical decision making.   Subjective:  Reviewed EMR prior to presenting to bedside.  Also discussed care with PCCM provider James Gave, NP.  NP has already followed up with family this morning regarding medical interventions at this time.  A decision was reached to shift to comfort focused care in a stepwise fashion to help facilitate family visitation.  For right now we will continue current antibiotics and peripheral pressors without further escalation.  Will de-escalate labs and painful interventions.  Will add as needed meds for comfort.  Planning to liberalize visitation.  Allowing for pleasure feeding. Able to discuss care with bedside RN for updates as well.  When presenting to bedside.  Patient sleeping comfortably so did not awaken.  No family present at bedside.  Able to call patient's daughter, [name], for discussion.  Again reviewed plan that has already been set in place by [facility] provider.  Allowing slow transition to comfort to have family come and visit.  Daughter but will be returning to bedside later this afternoon.  Spent time providing emotional support reactive listening.  Daughter noted patient and family comfortable with current care and glad patient can focus on quality of life at this point.  Patient was able to eat ice cream yesterday which was very enjoyable for him.  Also enjoyed his gospel music. Expressed hope for more good time making memories.  All questions answered at that time.  Noted palliative medicine team would continue to follow along with  patient's medical journey.  Objective:   Vital Signs:  BP 102/67   Pulse (!) 46   Temp (!) 97.4 F (36.3 C) (Axillary)   Resp (!) 23   Ht 6' (1.829 m)   Wt 53.9 kg   SpO2 100%   BMI 16.12 kg/m   Physical Exam: General: sleeping, cachectic, frail, ill-appearing Cardiovascular: RRR Respiratory:  increased work of breathing noted Skin: Multiple ecchymoses present on upper and lower extremities b/l Neuro: sleeping  Imaging: I personally reviewed recent imaging.   Assessment & Plan:   Assessment:   Patient is an 89 year old male with a past medical history of subarachnoid hemorrhage (01/2023), frequent falls, paroxysmal A-fib, CAD status post LAD 2022, hypertension, hypothyroidism, mixed conductive and sensorineural hearing loss, asthma, anemia of chronic disease, urinary retention, dyslipidemia, GERD, depression/anxiety, and severe protein calorie malnutrition who was admitted on 05/09/2023 from SNF for management of hypoxia of 88% on room air, generalized weakness, altered mental status, and fever.  During hospitalization patient has required admission to the ICU for management of septic shock secondary to left lower lobe pneumonia in setting of COVID-positive and possible aspiration pneumonia with known history of dysphagia.  Patient also receiving management for urinary tract infection, AKI, urinary retention, and DTI on heels/sacrum/bilateral hips that were reported to be present on admission.  Palliative medicine team consulted to assist with complex medical decision making.  Recommendations/Plan: # Complex medical decision making/goals of care:       -Patient unable to engage in complex medical decision-making due to current medical status.                -  Patient's daughter and PCCM provider have already discussed care plan for today.  Planning to slowly transition to a more comfort focused care plan at this time.  Will discontinue interventions that are painful such as lab work.   Continuing low-dose pressors at this time to allow family time to come to visit and say goodbye.  Patient allowed to eat for pleasure acknowledging risk of aspiration.  Visitation being liberalized so family can come to bedside.  Palliative medicine team continue to follow along to engage in conversations as able.                -  Code Status: Limited: Do not attempt resuscitation (DNR) -DNR-LIMITED -Do Not Intubate/DNI     # Psycho-social/Spiritual Support:  - Support System: Daughter, son-in-law, wife (multiple medical conditions including stroke preventing engagement in complex medical decision making), sister   # Discharge Planning: To Be Determined  Discussed with: RN, patient's daughter, PCCM NP  Thank you for allowing the palliative care team to participate in the care [facility] GomezSABRA Tinnie Radar, DO Palliative Care Provider PMT # [phone]  If patient remains symptomatic despite maximum doses, please call PMT at [phone] between 0700 and 1900. Outside of these hours, please call attending, as PMT does not have night coverage.  Personally spent 37 minutes in patient care including extensive chart review (labs, imaging, progress/consult notes, vital signs), medically appropraite exam, discussed with treatment team, education to patient, family, and staff, documenting clinical information, medication review and management, coordination of care, and available advanced directive documents.   *Please note that this is a verbal dictation therefore any spelling or grammatical errors are due to the Dragon Medical One system interpretation.

## 2023-05-11 NOTE — Progress Notes (Signed)
 Responded to consult for IV. Attempted US  IV to R forearm unsuccessful with bruising and edema at site. No other appropriate forearm veins. US  IV to L upper arm. Discussed vasopressor protocol with RN. Advised considering PICC/CVC; currently family is declining this per RN. Recommend seeking advice from MD regarding next steps for access.

## 2023-05-11 NOTE — Progress Notes (Signed)
 eLink Physician-Brief Progress Note Patient Name: James Reaume Sr. DOB: 07-Jul-1931 MRN: [id number]   Date of Service  05/11/2023  HPI/Events of Note  89 y.o. male with SAH (Sep 2024), frequent falls, dementia, PAF (paroxysmal atrial fibrillation), CAD S/P LAD stent 2022, HTN, hypothyroidism, mixed conductive and sensorineural hearing loss, asthma, anemia of chronic illness, severe protein-calorie malnutrition, urinary retention, dyslipidemia, GERD, depression, and anxiety, who was brought from SNF with hypoxia 88% on RA, general weakness, AMS, fever 101 F, and odor to urine   In the setting of advanced shock, family is limited interventions-no feeding tube, no central line.  Currently running norepinephrine  through a not ultrasound-guided forearm IV.  Ultrasound team able to place an upper arm IV which is typically blocked from policy directed vasopressor use.  eICU Interventions  Okay to use any available IV for infusion of norepinephrine .     Intervention Category Intermediate Interventions: Hypotension - evaluation and management  Jessiah Wojnar 05/11/2023, 1:19 AM

## 2023-05-11 NOTE — IPAL (Signed)
  Interdisciplinary Goals of Care Family Meeting   Date carried out: 05/11/2023  Location of the meeting: Bedside  Member's involved: Nurse Practitioner and Family Member or next of kin  Durable Power of Attorney or acting medical decision maker: Daughter [name]     Discussion: We discussed goals of care for James Sr. .  We talked about Welby' clinical status acutely and chronically. Regarding goals of care, there have already been appropriate decisions re DNR/I, no CVC, no enteral feeding tubes. Shared my concerns re likelihood of recurrent aspitaiton PNA and UTI given his chronic conditions, as well as his frailty w ongoing unintentional wt loss / FTT in adult.   [name] shares that she feels her dad is tired, and he has had a long good life. We talked about his degree of recovery following his tSAH in 2024 and she is grateful for the last several months she has had with him.  A decision was reached to plan to shift to comfort focussed care, with plan for in-patient comfort care. This will be done in a step-wise fashion to help facilitate family visitation.   For 1/2-- we will continue current abx and periph pressors, without further escalation. We will de-escalate labs and painful interventions. We will add PRN meds for comfort. Liberalize visitation. Pleasure feeds.   Further de-escalation 1/3 pending visitation schedule.      Code status:   Code Status: Limited: Do not attempt resuscitation (DNR) -DNR-LIMITED -Do Not Intubate/DNI    Disposition: Continue current acute care // plan to transition to comfort care in coming day(s)   Time spent for the meeting:     Ronnald FORBES Gave, NP  05/11/2023, 9:40 AM

## 2023-05-11 NOTE — Plan of Care (Signed)
   Problem: Clinical Measurements: Goal: Will remain free from infection Outcome: Progressing Goal: Diagnostic test results will improve Outcome: Progressing   Problem: Coping: Goal: Level of anxiety will decrease Outcome: Progressing   Problem: Safety: Goal: Ability to remain free from injury will improve Outcome: Progressing

## 2023-05-11 NOTE — Plan of Care (Signed)

## 2023-05-11 NOTE — Consult Note (Signed)
 WOC Nurse Consult Note: Reason for Consult:Patient with scattered deep tissue injuries to areas of bony prominence including bilateral heels.  Areas are padded and protected with foam dressings and offloading devices.  Patient is transitioning to comfort care and will be kept comfortable while minimizing unnecessary assessments  Wound type:pressure injuries, deep tissue  present on admission Pressure Injury POA: Yes bilateral heels Measurement: 8 cm x 8 cm right heel 6 cm x 5 cm left heel  sacrum 4 cm x 5 cm  (Per nursing flow sheet, assessed on admission)  Wound azi:pwujru maroon discoloration Drainage (amount, consistency, odor) none Periwound:intact Dressing procedure/placement/frequency: foam dressings to heels and sacrum.  Heel offloading with Prevaon boots.  Turn and reposition for comfort relief.  Will not follow at this time.  Please re-consult if needed.  Darice Cooley MSN, RN, FNP-BC CWON Wound, Ostomy, Continence Nurse Outpatient [facility] [phone] Pager [phone]

## 2023-05-12 DIAGNOSIS — R627 Adult failure to thrive: Secondary | ICD-10-CM | POA: Diagnosis not present

## 2023-05-12 DIAGNOSIS — E43 Unspecified severe protein-calorie malnutrition: Secondary | ICD-10-CM | POA: Diagnosis not present

## 2023-05-12 DIAGNOSIS — J690 Pneumonitis due to inhalation of food and vomit: Secondary | ICD-10-CM

## 2023-05-12 DIAGNOSIS — E860 Dehydration: Secondary | ICD-10-CM | POA: Diagnosis not present

## 2023-05-12 DIAGNOSIS — Z515 Encounter for palliative care: Secondary | ICD-10-CM | POA: Diagnosis not present

## 2023-05-12 DIAGNOSIS — U071 COVID-19: Secondary | ICD-10-CM | POA: Diagnosis not present

## 2023-05-12 DIAGNOSIS — A419 Sepsis, unspecified organism: Secondary | ICD-10-CM | POA: Diagnosis not present

## 2023-05-12 DIAGNOSIS — Z66 Do not resuscitate: Secondary | ICD-10-CM | POA: Diagnosis not present

## 2023-05-12 MED ORDER — MORPHINE SULFATE (PF) 2 MG/ML IV SOLN
1.0000 mg | INTRAVENOUS | Status: DC | PRN
  Administered 2023-05-13: 2 mg via INTRAVENOUS
  Administered 2023-05-13: 1 mg via INTRAVENOUS
  Administered 2023-05-17 – 2023-05-18 (×3): 2 mg via INTRAVENOUS
  Filled 2023-05-12 (×9): qty 1

## 2023-05-12 MED ORDER — DOCUSATE SODIUM 100 MG PO CAPS: 100.0000 mg | ORAL_CAPSULE | Freq: Every day | ORAL | Status: DC | PRN

## 2023-05-12 NOTE — Progress Notes (Signed)
 Daily Progress Note   Patient Name: James Misuraca Sr.       Date: 05/12/2023 DOB: 1932-05-02  Age: 89 y.o. MRN#: [id number] Attending Physician: Neda Jennet LABOR, MD Primary Care Physician: Sherre Clapper, MD Admit Date: 05/09/2023 Length of Stay: 3 days  Reason for Consultation/Follow-up: Establishing goals of care  Subjective:   CC: Patient laying in bed comfortably. Following up regarding complex medical decision making.   Subjective:  Reviewed EMR prior to presenting to bedside.  As per EMR review, patient has been transition to full comfort focused care today after discussion with PCCM provider.  Continuing allowing patient to eat for pleasure.  Presented to bedside to see patient.  Patient's family out of the room because patient getting cleaned.  Patient laying comfortably in bed.  Able to speak with patient's family including daughter, son-in-law, and grandsons out in the waiting area.  Again reviewed plan for comfort focused care today.  Patient may transfer out of the ICU if a room is available.  Spent time providing emotional support via active listening.  Daughter noted feeling comfortable at this time with focusing on patient's quality of life.  She is that patient has been able to enjoy ice cream.  Acknowledged and hope for more memorable moments with patient moving forward.  All questions answered at that time.  Noted palliative medicine team will continue to follow along with patient's medical journey.  Objective:   Vital Signs:  BP 132/78 (BP Location: Right Arm)   Pulse (!) 46   Temp (!) 97.4 F (36.3 C) (Axillary)   Resp 19   Ht 6' (1.829 m)   Wt 53.9 kg   SpO2 99%   BMI 16.12 kg/m   Physical Exam: General: laying comfortably in bed, cachectic, frail, ill-appearing Cardiovascular: RRR Respiratory:  increased work of breathing noted Skin: Multiple ecchymoses present on upper and lower extremities b/l  Imaging: I personally reviewed recent imaging.    Assessment & Plan:   Assessment:   Patient is an 89 year old male with a past medical history of subarachnoid hemorrhage (01/2023), frequent falls, paroxysmal A-fib, CAD status post LAD 2022, hypertension, hypothyroidism, mixed conductive and sensorineural hearing loss, asthma, anemia of chronic disease, urinary retention, dyslipidemia, GERD, depression/anxiety, and severe protein calorie malnutrition who was admitted on 05/09/2023 from SNF for management of hypoxia of 88% on room air, generalized weakness, altered mental status, and fever.  During hospitalization patient has required admission to the ICU for management of septic shock secondary to left lower lobe pneumonia in setting of COVID-positive and possible aspiration pneumonia with known history of dysphagia.  Patient also receiving management for urinary tract infection, AKI, urinary retention, and DTI on heels/sacrum/bilateral hips that were reported to be present on admission.  Palliative medicine team consulted to assist with complex medical decision making.  Recommendations/Plan: # Complex medical decision making/goals of care:       -Patient unable to engage in complex medical decision-making due to current medical status.                -Patient transition to full comfort focused care today after patient's daughter discussed with PCCM provider.  Will not perform further aggressive medical interventions and instead focus on patient's symptom management.  Patient eating for pleasure knowing aspiration risk.  Continuing comfort focused care at this time.  Patient may be able to transfer out of ICU today if room available.                -  Code Status: Limited: Do not attempt resuscitation (DNR) -DNR-LIMITED -Do Not Intubate/DNI     # Psycho-social/Spiritual Support:  - Support System: Daughter, son-in-law, wife (multiple medical conditions including stroke preventing engagement in complex medical decision making), sister   # Discharge  Planning: Continuing comfort focused care at this time with possible Anticipated Hospital Death  Discussed with: RN, patient's family  Thank you for allowing the palliative care team to participate in the care [facility] GomezSABRA Tinnie Radar, DO Palliative Care Provider PMT # [phone]  If patient remains symptomatic despite maximum doses, please call PMT at [phone] between 0700 and 1900. Outside of these hours, please call attending, as PMT does not have night coverage.  *Please note that this is a verbal dictation therefore any spelling or grammatical errors are due to the Dragon Medical One system interpretation.

## 2023-05-12 NOTE — Plan of Care (Signed)
  Problem: Education: Goal: Knowledge of risk factors and measures for prevention of condition will improve Outcome: Progressing   Problem: Coping: Goal: Psychosocial and spiritual needs will be supported Outcome: Progressing   Problem: Respiratory: Goal: Will maintain a patent airway Outcome: Progressing Goal: Complications related to the disease process, condition or treatment will be avoided or minimized Outcome: Progressing   Problem: Education: Goal: Knowledge of General Education information will improve Description: Including pain rating scale, medication(s)/side effects and non-pharmacologic comfort measures Outcome: Progressing   Problem: Health Behavior/Discharge Planning: Goal: Ability to manage health-related needs will improve Outcome: Progressing   Problem: Clinical Measurements: Goal: Ability to maintain clinical measurements within normal limits will improve Outcome: Progressing Goal: Will remain free from infection Outcome: Progressing Goal: Diagnostic test results will improve Outcome: Progressing Goal: Respiratory complications will improve Outcome: Progressing Goal: Cardiovascular complication will be avoided Outcome: Progressing   Problem: Activity: Goal: Risk for activity intolerance will decrease Outcome: Progressing   Problem: Nutrition: Goal: Adequate nutrition will be maintained Outcome: Progressing   Problem: Coping: Goal: Level of anxiety will decrease Outcome: Progressing   Problem: Elimination: Goal: Will not experience complications related to bowel motility Outcome: Progressing Goal: Will not experience complications related to urinary retention Outcome: Progressing   Problem: Pain Management: Goal: General experience of comfort will improve Outcome: Progressing   Problem: Safety: Goal: Ability to remain free from injury will improve Outcome: Progressing   Problem: Skin Integrity: Goal: Risk for impaired skin integrity will  decrease Outcome: Progressing   Problem: Education: Goal: Knowledge of the prescribed therapeutic regimen will improve Outcome: Progressing   Problem: Coping: Goal: Ability to identify and develop effective coping behavior will improve Outcome: Progressing   Problem: Clinical Measurements: Goal: Quality of life will improve Outcome: Progressing   Problem: Respiratory: Goal: Verbalizations of increased ease of respirations will increase Outcome: Progressing   Problem: Role Relationship: Goal: Family's ability to cope with current situation will improve Outcome: Progressing Goal: Ability to verbalize concerns, feelings, and thoughts to partner or family member will improve Outcome: Progressing   Problem: Pain Management: Goal: Satisfaction with pain management regimen will improve Outcome: Progressing

## 2023-05-12 NOTE — Progress Notes (Addendum)
 NAME:  James Macleod., MRN:  [id number], DOB:  [birth year], LOS: 3 ADMISSION DATE:  05/09/2023, CONSULTATION DATE:  05/09/2023 REFERRING MD: Bernard Drivers, MD, CHIEF COMPLAINT: Hypoxia today  History of Present Illness:  A 89 y.o. male with SAH (Sep 2024), frequent falls, dementia, PAF (paroxysmal atrial fibrillation), CAD S/P LAD stent 2022, HTN, hypothyroidism, mixed conductive and sensorineural hearing loss, asthma, anemia of chronic illness, severe protein-calorie malnutrition, urinary retention, dyslipidemia, GERD, depression, and anxiety, who was brought from SNF with hypoxia 88% on RA, general weakness, AMS, fever 101 F, and odor to urine. No reported  trauma/fall. EMS indicates bp soft 104/60, cbg 193. He was hypotensive in ED with BP 83/50.  Placed on 3 L [place], given 3 L LR, Rocephin , Zithromax , KCL IV.  Pertinent  Medical History  SAH (Sep 2024), frequent falls, dementia, PAF (paroxysmal atrial fibrillation), CAD S/P LAD stent 2022, HTN, hypothyroidism, mixed conductive and sensorineural hearing loss, bilateral, asthma, anemia of chronic illness, severe protein-calorie malnutrition, urinary retention, dyslipidemia, GERD, depression, anxiety  Significant Hospital Events: Including procedures, antibiotic start and stop dates in addition to other pertinent events   12/31 Admitted to ICU. On low dose pressors 1/2 plan to slowly transition to comfort care   Interim History / Subjective:  Na is normalized after being on d5 overnight   Objective   Blood pressure 115/67, pulse (!) 46, temperature (!) 97.4 F (36.3 C), temperature source Axillary, resp. rate 18, height 6' (1.829 m), weight 53.9 kg, SpO2 98%.        Intake/Output Summary (Last 24 hours) at 05/12/2023 1005 Last data filed at 05/12/2023 0745 Gross per 24 hour  Intake 1931.96 ml  Output 525 ml  Net 1406.96 ml   Filed Weights   05/09/23 1515 05/10/23 0200 05/11/23 0500  Weight: 57 kg 53.9 kg 53.9 kg   Physical  Exam: General:  Cachectic elderly M NAD  HENT: temporal muscle wasting.  Respiratory: Symmetrical chest expansion  Cardiovascular: cap refill < 3 sec  GI: thin  Extremities: no obvious acute joint deformity  Neuro:  awake, interactive, unintelligible speech  Skin: clean dry.  GU: foley     Resolved Hospital Problem list   AKI  Hypernatremia  Assessment & Plan:   Goals of Care DNR status FTT in adult Severe protein calorie malnutrition  Acute metabolic encephalopathy  Acute hypoxic resp failure 2/2 PNA detailed below  Septic shock LLL PNA Covid 19 PNA Aspiration PNA hx dysphagia UTI - polymicrobial (e coli, pseudomonas)  pAF CAD s/p LAD stent HTN HLD HFpEF  Hypothyroidism Asthma  Anemia of chronic dz/ Fe def  Urinary retention, chronic foley  Hx mechanical fall Hx rSAH  GERD  Depression/ anxiety  Dementia  DTIs, POA  Hypernatremia, improved  -plan is to transition to comfort care in coming days, after visitation is facilitated. -Destination: inpatient comfort care, unless he proves very stable off of pressors following their cessation   Plan For 1/3 after speaking with [name] (daughter) and Francis (Son-in-law): -Transition to comfort care.  - labs and painful interventions have been de-escalated  -dc NE, dc abx, dc steroids -cont PRN comfort meds, liberalized visitation  -cont pleasure feeds -- has been enjoying ice cream -iso for covid  -DNR  -will place transfer order to palli floor later 1/3.  -appreciate palliative following  Dispo: will ask [facility] to take over care 1/4     Best Practice (right click and Reselect all SmartList Selections daily)   Diet/type: Regular  consistency (see orders) DVT prophylaxis not indicated Pressure ulcer(s): present on admission  GI prophylaxis: N/A Lines: N/A Foley:  Yes, and it is still needed Code Status:  DNR Last date of multidisciplinary goals of care discussion [05/12/23]  Critical care time:      CRITICAL CARE Performed by: Ronnald FORBES Gave   Total critical care time: 48 min   Ronnald Gave MSN, AGACNP-BC [facility] Pulmonary/Critical Care Medicine Amion for pager  05/12/2023, 10:05 AM

## 2023-05-13 ENCOUNTER — Encounter (HOSPITAL_COMMUNITY): Payer: Self-pay | Admitting: Pulmonary Disease

## 2023-05-13 DIAGNOSIS — A419 Sepsis, unspecified organism: Secondary | ICD-10-CM | POA: Diagnosis not present

## 2023-05-13 DIAGNOSIS — U071 COVID-19: Secondary | ICD-10-CM | POA: Diagnosis not present

## 2023-05-13 DIAGNOSIS — R627 Adult failure to thrive: Secondary | ICD-10-CM

## 2023-05-13 DIAGNOSIS — E43 Unspecified severe protein-calorie malnutrition: Secondary | ICD-10-CM | POA: Diagnosis not present

## 2023-05-13 DIAGNOSIS — Z515 Encounter for palliative care: Secondary | ICD-10-CM | POA: Diagnosis not present

## 2023-05-13 LAB — URINE CULTURE: Culture: >=100000 — AB

## 2023-05-13 NOTE — Plan of Care (Signed)
  Problem: Education: Goal: Knowledge of risk factors and measures for prevention of condition will improve Outcome: Progressing   Problem: Coping: Goal: Psychosocial and spiritual needs will be supported Outcome: Progressing   Problem: Respiratory: Goal: Will maintain a patent airway Outcome: Progressing Goal: Complications related to the disease process, condition or treatment will be avoided or minimized Outcome: Progressing   Problem: Education: Goal: Knowledge of General Education information will improve Description: Including pain rating scale, medication(s)/side effects and non-pharmacologic comfort measures Outcome: Progressing   Problem: Health Behavior/Discharge Planning: Goal: Ability to manage health-related needs will improve Outcome: Progressing   Problem: Clinical Measurements: Goal: Ability to maintain clinical measurements within normal limits will improve Outcome: Progressing Goal: Will remain free from infection Outcome: Progressing Goal: Diagnostic test results will improve Outcome: Progressing Goal: Respiratory complications will improve Outcome: Progressing Goal: Cardiovascular complication will be avoided Outcome: Progressing   Problem: Activity: Goal: Risk for activity intolerance will decrease Outcome: Progressing   Problem: Nutrition: Goal: Adequate nutrition will be maintained Outcome: Progressing   Problem: Coping: Goal: Level of anxiety will decrease Outcome: Progressing   Problem: Elimination: Goal: Will not experience complications related to bowel motility Outcome: Progressing Goal: Will not experience complications related to urinary retention Outcome: Progressing   Problem: Pain Management: Goal: General experience of comfort will improve Outcome: Progressing   Problem: Safety: Goal: Ability to remain free from injury will improve Outcome: Progressing   Problem: Skin Integrity: Goal: Risk for impaired skin integrity will  decrease Outcome: Progressing   Problem: Education: Goal: Knowledge of the prescribed therapeutic regimen will improve Outcome: Progressing   Problem: Coping: Goal: Ability to identify and develop effective coping behavior will improve Outcome: Progressing   Problem: Clinical Measurements: Goal: Quality of life will improve Outcome: Progressing   Problem: Respiratory: Goal: Verbalizations of increased ease of respirations will increase Outcome: Progressing   Problem: Role Relationship: Goal: Family's ability to cope with current situation will improve Outcome: Progressing Goal: Ability to verbalize concerns, feelings, and thoughts to partner or family member will improve Outcome: Progressing   Problem: Pain Management: Goal: Satisfaction with pain management regimen will improve Outcome: Progressing

## 2023-05-13 NOTE — Plan of Care (Signed)
  Problem: Education: Goal: Knowledge of risk factors and measures for prevention of condition will improve Outcome: Progressing   Problem: Coping: Goal: Psychosocial and spiritual needs will be supported Outcome: Progressing   Problem: Respiratory: Goal: Complications related to the disease process, condition or treatment will be avoided or minimized Outcome: Progressing   Problem: Health Behavior/Discharge Planning: Goal: Ability to manage health-related needs will improve Outcome: Progressing

## 2023-05-13 NOTE — Progress Notes (Signed)
 PROGRESS NOTE    Grayling Schranz Sr.  FMW:[id number]  DOB: 10/04/1931  DOA: 05/09/2023 PCP: Sherre Clapper, MD Outpatient Specialists:   Hospital course:  89 year old man with dementia, HTN, CAD, PAF was doing well until 3 months ago when he fell and sustained a subarachnoid hemorrhage.  Since then he has had progressive decline in functionality.  He had been admitted to ICU with COVID and LLL and possibly aspiration pneumonia complicated by sepsis, metabolic encephalopathy requiring low-dose pressors.  Palliative care was involved and patient has been transitioned over to comfort care.   Subjective:  Patient was seen with his attentive daughter at bedside.  Patient himself was able to look at me but voiced no concerns or complaints.  Patient's daughter had several questions about how to manage the dying process, whether it was appropriate to put water  into his mouth with a syringe and whether it was okay for him to eat ice cream   Objective: Vitals:   05/12/23 0807 05/12/23 0900 05/12/23 0930 05/12/23 2024  BP:  115/67 106/63 (!) 116/95  Pulse:    89  Resp: 19 18 (!) 22 20  Temp:    97.7 F (36.5 C)  TempSrc:    Oral  SpO2: 99% 98% 99% (!) 83%  Weight:      Height:        Intake/Output Summary (Last 24 hours) at 05/13/2023 1358 Last data filed at 05/13/2023 1232 Gross per 24 hour  Intake 0 ml  Output 650 ml  Net -650 ml   Filed Weights   05/09/23 1515 05/10/23 0200 05/11/23 0500  Weight: 57 kg 53.9 kg 53.9 kg     Exam:  General: Thin man lying in bed intermittently with eyes closed intermittently looking around responding to his daughter sometimes. Rest of physical exam deferred   Data Reviewed:  Basic Metabolic Panel: Recent Labs  Lab 05/10/23 0313 05/10/23 1537 05/10/23 2132 05/11/23 0326 05/11/23 0952  NA 155* 150* 150* 148* 145  K 3.8 3.0* 3.1* 4.0 3.4*  CL 125* 124* 124* 123* 123*  CO2 24 22 21* 21* 18*  GLUCOSE 136* 147* 122* 145* 155*  BUN  34* 33* 34* 35* 34*  CREATININE 1.19 0.97 1.01 0.89 0.86  CALCIUM  7.8* 7.5* 7.4* 7.5* 7.3*  MG 2.1  --   --  2.3  --   PHOS 3.4  --   --   --   --     CBC: Recent Labs  Lab 05/09/23 1527 05/10/23 0313 05/11/23 0326  WBC 24.5* 17.6* 22.5*  NEUTROABS 23.1*  --   --   HGB 11.6* 9.1* 9.0*  HCT 40.6 30.1* 29.7*  MCV 95.1 94.4 94.0  PLT 291 183 205     Scheduled Meds:  Chlorhexidine  Gluconate Cloth  6 each Topical Daily   Continuous Infusions:   Assessment & Plan:   89 year old gentleman with progressive decline since fall 3 months ago Presently on comfort care symptom management Patient is comfortable Support provided to daughter, all questions answered Appreciate ongoing support from palliative care service    DVT prophylaxis: N/A Code Status: DNR Family Communication: Daughter at bedside throughout     Studies: No results found.  Principal Problem:   Sepsis (HCC) Active Problems:   PAF (paroxysmal atrial fibrillation) (HCC)   Ischemic cardiomyopathy   Acquired hypothyroidism   Asthma-COPD overlap syndrome (HCC)   Other fatigue   COVID-19 virus infection   Elevated lactic acid level   Need for emotional  support   DNR (do not resuscitate)   Goals of care, counseling/discussion   Counseling and coordination of care   Sepsis due to urinary tract infection (HCC)   Septic shock (HCC)   Severe protein-calorie malnutrition (HCC)   Palliative care encounter   FTT (failure to thrive) in adult   Aspiration pneumonia (HCC)   Encounter for palliative care   Pressure injury of skin   Dehydration     Crystal Scarberry Vangie Kazmi, [facility]  If 7PM-7AM, please contact night-coverage [email or web address]   LOS: 4 days

## 2023-05-13 NOTE — Progress Notes (Addendum)
 Daily Progress Note   Patient Name: James Allman Sr.       Date: 05/13/2023 DOB: 1931/08/29  Age: 89 y.o. MRN#: [id number] Attending Physician: Dana Ivan Masse* Primary Care Physician: Sherre Clapper, MD Admit Date: 05/09/2023 Length of Stay: 4 days  Reason for Consultation/Follow-up: Establishing goals of care  Subjective:   CC: Patient laying in bed comfortably. Following up regarding complex medical decision making.   Subjective:  Reviewed EMR prior to presenting to bedside.  Patient urine output continues to decrease. In past 24 hours at time of EMR review, patient has required IV morphine  1mg  x 1 and 2mg  x 1.   Presented to bedside to see patient. Patient sleeping comfortably in bed so did not awaken. Patient's daughter at bedside. Spent time reviewing comfort focused care again for patient. Patient eating ice cream for pleasure. Also discussed that should patient find nasal canula uncomfortable or take it off himself, would recommend to leave it off as can be very comfortable to people. Daughter acknowledged this. More family coming to visit patient today. All questions answered at that time. Noted PMT would continue to follow along with patient;'s medical journey.   Discussed care with hosptialist and RN to update.   Objective:   Vital Signs:  BP (!) 116/95 (BP Location: Left Arm)   Pulse 89   Temp 97.7 F (36.5 C) (Oral)   Resp 20   Ht 6' (1.829 m)   Wt 53.9 kg   SpO2 (!) 83%   BMI 16.12 kg/m   Physical Exam: General: sleeping, cachectic, frail, ill-appearing Cardiovascular: RRR Respiratory:  increased work of breathing noted Skin: Multiple ecchymoses present on upper and lower extremities b/l  Imaging: I personally reviewed recent imaging.   Assessment & Plan:   Assessment:   Patient is an 89 year old male with a past medical history of subarachnoid hemorrhage (01/2023), frequent falls, paroxysmal A-fib, CAD status post LAD 2022, hypertension,  hypothyroidism, mixed conductive and sensorineural hearing loss, asthma, anemia of chronic disease, urinary retention, dyslipidemia, GERD, depression/anxiety, and severe protein calorie malnutrition who was admitted on 05/09/2023 from SNF for management of hypoxia of 88% on room air, generalized weakness, altered mental status, and fever.  During hospitalization patient has required admission to the ICU for management of septic shock secondary to left lower lobe pneumonia in setting of COVID-positive and possible aspiration pneumonia with known history of dysphagia.  Patient also receiving management for urinary tract infection, AKI, urinary retention, and DTI on heels/sacrum/bilateral hips that were reported to be present on admission.  Palliative medicine team consulted to assist with complex medical decision making.  Recommendations/Plan: # Complex medical decision making/goals of care:                -Patient transitioned to full comfort focused care on 05/12/23. TO be continued in hospital.  Will not perform further aggressive medical interventions and instead focus on patient's symptom management.  Patient eating for pleasure knowing aspiration risk.  Continuing comfort focused care at this time.                Code Status: Do not attempt resuscitation (DNR) - Comfort care   # Psycho-social/Spiritual Support:  - Support System: Daughter, son-in-law, wife (multiple medical conditions including stroke preventing engagement in complex medical decision making), sister   # Discharge Planning: Continuing comfort focused care at this time with possible Anticipated Hospital Death  Discussed with: RN, patient's daughter, hospitalist   Thank you for allowing the palliative care  team to participate in the care [facility] GomezSABRA Tinnie Radar, DO Palliative Care Provider PMT # [phone]  If patient remains symptomatic despite maximum doses, please call PMT at [phone] between 0700 and 1900.  Outside of these hours, please call attending, as PMT does not have night coverage.  *Please note that this is a verbal dictation therefore any spelling or grammatical errors are due to the Dragon Medical One system interpretation.

## 2023-05-14 DIAGNOSIS — Z79899 Other long term (current) drug therapy: Secondary | ICD-10-CM

## 2023-05-14 DIAGNOSIS — A419 Sepsis, unspecified organism: Secondary | ICD-10-CM | POA: Diagnosis not present

## 2023-05-14 DIAGNOSIS — U071 COVID-19: Secondary | ICD-10-CM | POA: Diagnosis not present

## 2023-05-14 DIAGNOSIS — Z515 Encounter for palliative care: Secondary | ICD-10-CM | POA: Diagnosis not present

## 2023-05-14 DIAGNOSIS — E43 Unspecified severe protein-calorie malnutrition: Secondary | ICD-10-CM | POA: Diagnosis not present

## 2023-05-14 DIAGNOSIS — J690 Pneumonitis due to inhalation of food and vomit: Secondary | ICD-10-CM | POA: Diagnosis not present

## 2023-05-14 LAB — CULTURE, BLOOD (ROUTINE X 2)
Culture: NO GROWTH
Culture: NO GROWTH
Special Requests: ADEQUATE
Special Requests: ADEQUATE

## 2023-05-14 MED ORDER — LORAZEPAM 2 MG/ML PO CONC
1.0000 mg | ORAL | Status: DC | PRN
  Filled 2023-05-14: qty 0.5

## 2023-05-14 MED ORDER — MORPHINE SULFATE (CONCENTRATE) 10 MG /0.5 ML PO SOLN
5.0000 mg | ORAL | Status: DC | PRN
  Administered 2023-05-14 – 2023-05-15 (×5): 5 mg via ORAL
  Filled 2023-05-14 (×5): qty 0.5

## 2023-05-14 NOTE — Progress Notes (Signed)
 PROGRESS NOTE    James Poffenberger Sr.  FMW:[id number]  DOB: [birth year]  DOA: 05/09/2023 PCP: Sherre Clapper, MD Outpatient Specialists:   Hospital course:  89 year old man with dementia, HTN, CAD, PAF was doing well until 3 months ago when he fell and sustained a subarachnoid hemorrhage.  Since then he has had progressive decline in functionality.  He had been admitted to ICU with COVID and LLL and possibly aspiration pneumonia complicated by sepsis, metabolic encephalopathy requiring low-dose pressors.  Palliative care was involved and patient has been transitioned over to comfort care.   Subjective:  Patient was seen with his attentive daughter at bedside.  Patient appears sleepier than yesterday but comfortable  Objective: Vitals:   05/12/23 0930 05/12/23 2024 05/13/23 1521 05/13/23 2016  BP: 106/63 (!) 116/95 (!) 146/79 128/77  Pulse:  89 79 86  Resp: (!) 22 20 18 14   Temp:  97.7 F (36.5 C) 97.6 F (36.4 C) 97.7 F (36.5 C)  TempSrc:  Oral Axillary Oral  SpO2: 99% (!) 83% 97% 97%  Weight:      Height:        Intake/Output Summary (Last 24 hours) at 05/14/2023 1700 Last data filed at 05/14/2023 1049 Gross per 24 hour  Intake 60 ml  Output 350 ml  Net -290 ml   Filed Weights   05/09/23 1515 05/10/23 0200 05/11/23 0500  Weight: 57 kg 53.9 kg 53.9 kg     Exam:  General: Thin man lying in bed intermittently with eyes closed intermittently looking around responding to his daughter sometimes. Rest of physical exam deferred   Data Reviewed:  Basic Metabolic Panel: Recent Labs  Lab 05/10/23 0313 05/10/23 1537 05/10/23 2132 05/11/23 0326 05/11/23 0952  NA 155* 150* 150* 148* 145  K 3.8 3.0* 3.1* 4.0 3.4*  CL 125* 124* 124* 123* 123*  CO2 24 22 21* 21* 18*  GLUCOSE 136* 147* 122* 145* 155*  BUN 34* 33* 34* 35* 34*  CREATININE 1.19 0.97 1.01 0.89 0.86  CALCIUM  7.8* 7.5* 7.4* 7.5* 7.3*  MG 2.1  --   --  2.3  --   PHOS 3.4  --   --   --   --      CBC: Recent Labs  Lab 05/09/23 1527 05/10/23 0313 05/11/23 0326  WBC 24.5* 17.6* 22.5*  NEUTROABS 23.1*  --   --   HGB 11.6* 9.1* 9.0*  HCT 40.6 30.1* 29.7*  MCV 95.1 94.4 94.0  PLT 291 183 205     Scheduled Meds:   Continuous Infusions:   Assessment & Plan:   89 year old gentleman with progressive decline since fall 3 months ago Presently on comfort care symptom management Patient is comfortable Support provided to daughter, all questions answered Appreciate ongoing support from palliative care service    DVT prophylaxis: N/A Code Status: DNR Family Communication: Daughter at bedside throughout     Studies: No results found.  Principal Problem:   Sepsis (HCC) Active Problems:   PAF (paroxysmal atrial fibrillation) (HCC)   Ischemic cardiomyopathy   Acquired hypothyroidism   Asthma-COPD overlap syndrome (HCC)   Other fatigue   COVID-19 virus infection   Elevated lactic acid level   Need for emotional support   DNR (do not resuscitate)   Goals of care, counseling/discussion   Counseling and coordination of care   Sepsis due to urinary tract infection (HCC)   Septic shock (HCC)   Severe protein-calorie malnutrition (HCC)   Palliative care encounter  FTT (failure to thrive) in adult   Aspiration pneumonia (HCC)   Encounter for palliative care   Pressure injury of skin   Dehydration   Medication management     Ivan Vangie Golla, [facility]  If 7PM-7AM, please contact night-coverage [email or web address]   LOS: 5 days

## 2023-05-14 NOTE — Plan of Care (Signed)
   Problem: Education: Goal: Knowledge of risk factors and measures for prevention of condition will improve Outcome: Progressing   Problem: Coping: Goal: Psychosocial and spiritual needs will be supported Outcome: Progressing   Problem: Respiratory: Goal: Will maintain a patent airway Outcome: Progressing Goal: Complications related to the disease process, condition or treatment will be avoided or minimized Outcome: Progressing

## 2023-05-14 NOTE — Plan of Care (Signed)
 Problem: Education: Goal: Knowledge of risk factors and measures for prevention of condition will improve 05/14/2023 2155 by Federico Maiorino, RN Outcome: Progressing 05/14/2023 2155 by Singleton Hickox, RN Outcome: Progressing   Problem: Coping: Goal: Psychosocial and spiritual needs will be supported 05/14/2023 2155 by Cherl Shanks, RN Outcome: Progressing 05/14/2023 2155 by Cherl Shanks, RN Outcome: Progressing   Problem: Respiratory: Goal: Will maintain a patent airway 05/14/2023 2155 by Cherl Shanks, RN Outcome: Progressing 05/14/2023 2155 by Cherl Shanks, RN Outcome: Progressing Goal: Complications related to the disease process, condition or treatment will be avoided or minimized 05/14/2023 2155 by Cherl Shanks, RN Outcome: Progressing 05/14/2023 2155 by Cherl Shanks, RN Outcome: Progressing   Problem: Education: Goal: Knowledge of General Education information will improve Description: Including pain rating scale, medication(s)/side effects and non-pharmacologic comfort measures 05/14/2023 2155 by Genever Hentges, RN Outcome: Progressing 05/14/2023 2155 by Cherl Shanks, RN Outcome: Progressing   Problem: Health Behavior/Discharge Planning: Goal: Ability to manage health-related needs will improve 05/14/2023 2155 by Sylvester Minton, RN Outcome: Progressing 05/14/2023 2155 by Cherl Shanks, RN Outcome: Progressing   Problem: Clinical Measurements: Goal: Ability to maintain clinical measurements within normal limits will improve 05/14/2023 2155 by Cherl Shanks, RN Outcome: Progressing 05/14/2023 2155 by Cherl Shanks, RN Outcome: Progressing Goal: Will remain free from infection 05/14/2023 2155 by Cherl Shanks, RN Outcome: Progressing 05/14/2023 2155 by Cherl Shanks, RN Outcome: Progressing Goal: Diagnostic test results will improve 05/14/2023 2155 by Cherl Shanks, RN Outcome: Progressing 05/14/2023 2155 by Cherl Shanks, RN Outcome:  Progressing Goal: Respiratory complications will improve 05/14/2023 2155 by Cherl Shanks, RN Outcome: Progressing 05/14/2023 2155 by Cherl Shanks, RN Outcome: Progressing Goal: Cardiovascular complication will be avoided 05/14/2023 2155 by Cherl Shanks, RN Outcome: Progressing 05/14/2023 2155 by Cherl Shanks, RN Outcome: Progressing   Problem: Activity: Goal: Risk for activity intolerance will decrease 05/14/2023 2155 by Lanyia Jewel, RN Outcome: Progressing 05/14/2023 2155 by Cherl Shanks, RN Outcome: Progressing   Problem: Nutrition: Goal: Adequate nutrition will be maintained 05/14/2023 2155 by Cherl Shanks, RN Outcome: Progressing 05/14/2023 2155 by Cherl Shanks, RN Outcome: Progressing   Problem: Coping: Goal: Level of anxiety will decrease 05/14/2023 2155 by Cherl Shanks, RN Outcome: Progressing 05/14/2023 2155 by Cherl Shanks, RN Outcome: Progressing   Problem: Elimination: Goal: Will not experience complications related to bowel motility 05/14/2023 2155 by Cherl Shanks, RN Outcome: Progressing 05/14/2023 2155 by Cherl Shanks, RN Outcome: Progressing Goal: Will not experience complications related to urinary retention 05/14/2023 2155 by Cherl Shanks, RN Outcome: Progressing 05/14/2023 2155 by Cherl Shanks, RN Outcome: Progressing   Problem: Pain Management: Goal: General experience of comfort will improve 05/14/2023 2155 by Cherl Shanks, RN Outcome: Progressing 05/14/2023 2155 by Cherl Shanks, RN Outcome: Progressing   Problem: Safety: Goal: Ability to remain free from injury will improve 05/14/2023 2155 by Cherl Shanks, RN Outcome: Progressing 05/14/2023 2155 by Cherl Shanks, RN Outcome: Progressing   Problem: Skin Integrity: Goal: Risk for impaired skin integrity will decrease 05/14/2023 2155 by Cherl Shanks, RN Outcome: Progressing 05/14/2023 2155 by Cherl Shanks, RN Outcome: Progressing   Problem:  Education: Goal: Knowledge of the prescribed therapeutic regimen will improve 05/14/2023 2155 by Kijuan Gallicchio, RN Outcome: Progressing 05/14/2023 2155 by Cherree Conerly, RN Outcome: Progressing   Problem: Coping: Goal: Ability to identify and develop effective coping behavior will improve 05/14/2023 2155 by Garyson Stelly, RN Outcome: Progressing 05/14/2023 2155 by Simmie Camerer, RN Outcome: Progressing   Problem: Clinical Measurements: Goal: Quality of life will improve 05/14/2023 2155 by Ellanor Feuerstein,  Matt, RN Outcome: Progressing 05/14/2023 2155 by Jaylia Pettus, RN Outcome: Progressing   Problem: Respiratory: Goal: Verbalizations of increased ease of respirations will increase 05/14/2023 2155 by Cherl Matt, RN Outcome: Progressing 05/14/2023 2155 by Cherl Matt, RN Outcome: Progressing   Problem: Role Relationship: Goal: Family's ability to cope with current situation will improve 05/14/2023 2155 by Breia Ocampo, RN Outcome: Progressing 05/14/2023 2155 by Jianni Shelden, RN Outcome: Progressing Goal: Ability to verbalize concerns, feelings, and thoughts to partner or family member will improve 05/14/2023 2155 by Sai Zinn, RN Outcome: Progressing 05/14/2023 2155 by Pilot Prindle, RN Outcome: Progressing   Problem: Pain Management: Goal: Satisfaction with pain management regimen will improve 05/14/2023 2155 by Kenly Xiao, RN Outcome: Progressing 05/14/2023 2155 by Symiah Nowotny, RN Outcome: Progressing

## 2023-05-14 NOTE — Progress Notes (Signed)
 Daily Progress Note   Patient Name: James Hobby Sr.       Date: 05/14/2023 DOB: April 17, 1932  Age: 89 y.o. MRN#: [id number] Attending Physician: [name]* Primary Care Physician: Sherre Clapper, MD Admit Date: 05/09/2023 Length of Stay: 5 days  Reason for Consultation/Follow-up: Establishing goals of care  Subjective:   CC: Patient appears agitated with increased work of breathing today when seen. Following up regarding complex medical decision making.   Subjective:  Reviewed EMR prior to presenting to bedside.  At time of EMR review in past 24 hours patient has received IV morphine  1 mg x 1 dose and 2 mg x 1 dose.  Presented to bedside to meet with patient.  Patient laying in bed with daughter at bedside.  Patient will occasionally awaken though mumbles and appears very agitated and will quickly fall back asleep.  Patient noted to have continuous increased work of breathing.  Daughter acknowledges that she thinks time is getting close.  Spent time providing emotional support via active listening. Normalized dying process and symptoms to look for.  Discussed continuing medications to assist with symptom management.  Daughter agreeing with patient continuing to receive IV morphine  for increased work of breathing such as noted now.  Noted would have RN provide.  All questions answered at that time.  Noted palliative medicine team will continue to follow along with patient's medical journey.  Discussed care with RN after visit.  Objective:   Vital Signs:  BP 128/77 (BP Location: Left Arm)   Pulse 86   Temp 97.7 F (36.5 C) (Oral)   Resp 14   Ht 6' (1.829 m)   Wt 53.9 kg   SpO2 97%   BMI 16.12 kg/m   Physical Exam: General: agitated when awake, confused, cachectic, frail, ill-appearing Cardiovascular: RRR Respiratory:  increased work of breathing noted Skin: Multiple ecchymoses present on upper and lower extremities b/l  Imaging: I personally reviewed recent  imaging.   Assessment & Plan:   Assessment:   Patient is an 89 year old male with a past medical history of subarachnoid hemorrhage (01/2023), frequent falls, paroxysmal A-fib, CAD status post LAD 2022, hypertension, hypothyroidism, mixed conductive and sensorineural hearing loss, asthma, anemia of chronic disease, urinary retention, dyslipidemia, GERD, depression/anxiety, and severe protein calorie malnutrition who was admitted on 05/09/2023 from SNF for management of hypoxia of 88% on room air, generalized weakness, altered mental status, and fever.  During hospitalization patient has required admission to the ICU for management of septic shock secondary to left lower lobe pneumonia in setting of COVID-positive and possible aspiration pneumonia with known history of dysphagia.  Patient also receiving management for urinary tract infection, AKI, urinary retention, and DTI on heels/sacrum/bilateral hips that were reported to be present on admission.  Palliative medicine team consulted to assist with complex medical decision making.  Recommendations/Plan: # Complex medical decision making/goals of care:                -Patient transitioned to full comfort focused care on 05/12/23; To be continued in hospital.  Will not perform further aggressive medical interventions and instead focus on patient's symptom management.  Patient eating for pleasure knowing aspiration risk.  Continuing comfort focused care at this time.                Code Status: Do not attempt resuscitation (DNR) - Comfort care   # Symptoms Management  -Continue medications aimed at patient's comfort such as morphine  for pain/shortness of breath, Ativan  for  agitation, and Robinul  for secretions.  -Discontinued oral medications in light of patient's worsening agitation.  Patient has IV medications available for symptom management.  # Psycho-social/Spiritual Support:  - Support System: Daughter, son-in-law, wife (multiple medical  conditions including stroke preventing engagement in complex medical decision making), sister   # Discharge Planning: Continuing comfort focused care at this time with [facility]  Discussed with: RN, patient's daughter  Thank you for allowing the palliative care team to participate in the care James GomezSABRA Tinnie Radar, DO Palliative Care Provider PMT # [phone]  If patient remains symptomatic despite maximum doses, please call PMT at [phone] between 0700 and 1900. Outside of these hours, please call attending, as PMT does not have night coverage.  Personally spent 35 minutes in patient care including extensive chart review (labs, imaging, progress/consult notes, vital signs), medically appropraite exam, discussed with treatment team, education to patient, family, and staff, documenting clinical information, medication review and management, coordination of care, and available advanced directive documents.   *Please note that this is a verbal dictation therefore any spelling or grammatical errors are due to the Dragon Medical One system interpretation.

## 2023-05-15 ENCOUNTER — Ambulatory Visit: Payer: Medicare HMO | Admitting: Cardiology

## 2023-05-15 DIAGNOSIS — N171 Acute kidney failure with acute cortical necrosis: Secondary | ICD-10-CM | POA: Diagnosis not present

## 2023-05-15 DIAGNOSIS — R4589 Other symptoms and signs involving emotional state: Secondary | ICD-10-CM | POA: Diagnosis not present

## 2023-05-15 DIAGNOSIS — Z515 Encounter for palliative care: Secondary | ICD-10-CM | POA: Diagnosis not present

## 2023-05-15 DIAGNOSIS — Z7189 Other specified counseling: Secondary | ICD-10-CM | POA: Diagnosis not present

## 2023-05-15 DIAGNOSIS — R6520 Severe sepsis without septic shock: Secondary | ICD-10-CM

## 2023-05-15 DIAGNOSIS — Z79899 Other long term (current) drug therapy: Secondary | ICD-10-CM | POA: Diagnosis not present

## 2023-05-15 DIAGNOSIS — A419 Sepsis, unspecified organism: Secondary | ICD-10-CM | POA: Diagnosis not present

## 2023-05-15 NOTE — Progress Notes (Signed)
 Daily Progress Note   Patient Name: James Mirsky Sr.       Date: 05/15/2023 DOB: Mar 23, 1932  Age: 89 y.o. MRN#: [id number] Attending Physician: Christobal Guadalajara, MD Primary Care Physician: Sherre Clapper, MD Admit Date: 05/09/2023 Length of Stay: 6 days  Reason for Consultation/Follow-up: Establishing goals of care  Subjective:   CC: Patient laying in bed resting comfortably. Following up regarding complex medical decision making.   Subjective:  Reviewed EMR prior to presenting to bedside.  At time of EMR review in past 24 hours patient has received oral morphine  5 mg x 3 doses.  Had changed to oral medications yesterday when patient lost IV access and this could not be reobtained.  Presented to bedside to meet with patient and family.  Patient's daughter at bedside.  Discussed how family had continued visiting yesterday.  Patient did not want to eat at all yesterday.  Patient had minimal water  intake yesterday.  Patient has been less responsive today.  Discussed and normalized continued progression at end-of-life.  Daughter is primary focus is that patient remain comfortable.  Discussed continuing medications for this. With permission, removed nasal cannula as well as creating marks and indentations in patient's skin which could cause breakdown.  Discussed with daughter who agrees with leaving this off of patient.  Informed RN to please leave off as well and not replace.  Priority is comfort not numbers.  Discussed with daughter continuing comfort focused care here.  Family has stated they are unable to support patient's care at home with hospice.  Did discuss that should patient continue to be with us  for a longer period of time, may need to consider inpatient hospice evaluation.  Daughter agreeing with continued discussions regarding this.  Noted this provider would be going off service though colleague would be coming on to follow-up tomorrow.  Can continue conversations about inpatient hospice  if appropriate for transfer.  Spent time answering questions as able.  Provided emotional support via active listening.  Thanked daughter for allowing me to visit with her and her father today.  Updated RN and hospitalist regarding conversation.  Objective:   Vital Signs:  BP (!) 149/72 (BP Location: Left Arm)   Pulse 80   Temp 97.8 F (36.6 C) (Oral)   Resp 20   Ht 6' (1.829 m)   Wt 53.9 kg   SpO2 (!) 78%   BMI 16.12 kg/m   Physical Exam: General: sleeping, cachectic, frail, ill-appearing Cardiovascular: RRR Respiratory:  increased work of breathing noted Skin: Multiple ecchymoses present on upper and lower extremities b/l  Imaging: I personally reviewed recent imaging.   Assessment & Plan:   Assessment:   Patient is an 89 year old male with a past medical history of subarachnoid hemorrhage (01/2023), frequent falls, paroxysmal A-fib, CAD status post LAD 2022, hypertension, hypothyroidism, mixed conductive and sensorineural hearing loss, asthma, anemia of chronic disease, urinary retention, dyslipidemia, GERD, depression/anxiety, and severe protein calorie malnutrition who was admitted on 05/09/2023 from SNF for management of hypoxia of 88% on room air, generalized weakness, altered mental status, and fever.  During hospitalization patient has required admission to the ICU for management of septic shock secondary to left lower lobe pneumonia in setting of COVID-positive and possible aspiration pneumonia with known history of dysphagia.  Patient also receiving management for urinary tract infection, AKI, urinary retention, and DTI on heels/sacrum/bilateral hips that were reported to be present on admission.  Palliative medicine team consulted to assist with complex medical decision making.  Recommendations/Plan: #  Complex medical decision making/goals of care:                -Patient transitioned to full comfort focused care on 05/12/23; To be continued in hospital.  Will not  perform further aggressive medical interventions and instead focus on patient's symptom management.  Patient eating for pleasure knowing aspiration risk.  Continuing comfort focused care at this time.                Code Status: Do not attempt resuscitation (DNR) - Comfort care   # Symptoms Management  -Continue medications aimed at patient's comfort such as morphine  for pain/shortness of breath, Ativan  for agitation, and Robinul  for secretions.  Has oral medications at this time due to loss of IV access.  -Removed patient's nasal cannula as causing paint through skin indentation. Do not put back on patient please.   # Psycho-social/Spiritual Support:  - Support System: Daughter, son-in-law, wife (multiple medical conditions including stroke preventing engagement in complex medical decision making), sister   # Discharge Planning: Continuing comfort focused care at this time with [facility] vs could consider inpatient hospice referral if appropraite and after convo with daughter   Discussed with: RN, patient's daughter, hospitalist   Thank you for allowing the palliative care team to participate in the care [facility] GomezSABRA Tinnie Radar, DO Palliative Care Provider PMT # [phone]  If patient remains symptomatic despite maximum doses, please call PMT at [phone] between 0700 and 1900. Outside of these hours, please call attending, as PMT does not have night coverage.  Personally spent 37 minutes in patient care including extensive chart review (labs, imaging, progress/consult notes, vital signs), medically appropraite exam, discussed with treatment team, education to patient, family, and staff, documenting clinical information, medication review and management, coordination of care, and available advanced directive documents.   *Please note that this is a verbal dictation therefore any spelling or grammatical errors are due to the Dragon [employer] One system  interpretation.

## 2023-05-15 NOTE — Hospital Course (Addendum)
 89 year old man with dementia, HTN, PAF, frequent falls, [facility] September 2024, CAD status post LAD stent 2022, hypothyroidism mixed conductive and sensorineural hearing loss, asthma, anemia of chronic illness, severe protein calorie malnutrition, urine retention, dyslipidemia, GERD, anxiety/depression who was doing well until 3 months ago when he fell and sustained a subarachnoid hemorrhage. Since then he has had progressive decline in functionality. He had been admitted to ICU with COVID and LLL and possibly aspiration pneumonia complicated by sepsis, metabolic encephalopathy requiring low-dose pressors. Palliative care was involved and patient has been transitioned over to comfort care.

## 2023-05-15 NOTE — Plan of Care (Signed)
  Problem: Education: Goal: Knowledge of risk factors and measures for prevention of condition will improve Outcome: Progressing   Problem: Coping: Goal: Psychosocial and spiritual needs will be supported Outcome: Progressing   Problem: Respiratory: Goal: Will maintain a patent airway Outcome: Progressing Goal: Complications related to the disease process, condition or treatment will be avoided or minimized Outcome: Progressing   Problem: Education: Goal: Knowledge of General Education information will improve Description: Including pain rating scale, medication(s)/side effects and non-pharmacologic comfort measures Outcome: Progressing   Problem: Health Behavior/Discharge Planning: Goal: Ability to manage health-related needs will improve Outcome: Progressing   Problem: Clinical Measurements: Goal: Ability to maintain clinical measurements within normal limits will improve Outcome: Progressing Goal: Will remain free from infection Outcome: Progressing Goal: Diagnostic test results will improve Outcome: Progressing Goal: Respiratory complications will improve Outcome: Progressing Goal: Cardiovascular complication will be avoided Outcome: Progressing   Problem: Activity: Goal: Risk for activity intolerance will decrease Outcome: Progressing   Problem: Nutrition: Goal: Adequate nutrition will be maintained Outcome: Progressing   Problem: Coping: Goal: Level of anxiety will decrease Outcome: Progressing   Problem: Elimination: Goal: Will not experience complications related to bowel motility Outcome: Progressing Goal: Will not experience complications related to urinary retention Outcome: Progressing   Problem: Pain Management: Goal: General experience of comfort will improve Outcome: Progressing   Problem: Safety: Goal: Ability to remain free from injury will improve Outcome: Progressing   Problem: Skin Integrity: Goal: Risk for impaired skin integrity will  decrease Outcome: Progressing   Problem: Education: Goal: Knowledge of the prescribed therapeutic regimen will improve Outcome: Progressing   Problem: Coping: Goal: Ability to identify and develop effective coping behavior will improve Outcome: Progressing   Problem: Clinical Measurements: Goal: Quality of life will improve Outcome: Progressing   Problem: Respiratory: Goal: Verbalizations of increased ease of respirations will increase Outcome: Progressing   Problem: Role Relationship: Goal: Family's ability to cope with current situation will improve Outcome: Progressing Goal: Ability to verbalize concerns, feelings, and thoughts to partner or family member will improve Outcome: Progressing   Problem: Pain Management: Goal: Satisfaction with pain management regimen will improve Outcome: Progressing

## 2023-05-15 NOTE — Progress Notes (Signed)
 PROGRESS NOTE James Nealy Sr.  FMW:[id number] DOB: 08/03/1931 DOA: 05/09/2023 PCP: Sherre Clapper, MD  Brief Narrative/Hospital Course: 89 year old man with dementia, HTN, PAF, frequent falls, [facility] September 2024, CAD status post LAD stent 2022, hypothyroidism mixed conductive and sensorineural hearing loss, asthma, anemia of chronic illness, severe protein calorie malnutrition, urine retention, dyslipidemia, GERD, anxiety/depression who was doing well until 3 months ago when he fell and sustained a subarachnoid hemorrhage. Since then he has had progressive decline in functionality. He had been admitted to ICU with COVID and LLL and possibly aspiration pneumonia complicated by sepsis, metabolic encephalopathy requiring low-dose pressors. Palliative care was involved and patient has been transitioned over to comfort care.   Subjective: Patient seen and examined, minimally responsive breathing spontaneously, comfortable. Daughter at the bedside No concerns Reported she spoke with palliative care earlier-weaned off oxygen to room air   Assessment and Plan: End-of-life care FTT in adult Severe malnutrition Acute metabolic encephalopathy  Acute hypoxic resp failure 2/2 PNA Septic shock LLL PNA Covid 19 PNA Aspiration PNA hx dysphagia UTI - polymicrobial (e coli, pseudomonas)  pAF CAD s/p LAD stent HTN HLD HFpEF  Hypothyroidism Asthma  Anemia of chronic dz/ Fe def  Urinary retention, chronic foley  Hx mechanical fall Hx rSAH  GERD  Depression/ anxiety  Dementia  DTIs, POA ;  Discussed with palliative care, appreciate input, continue current end-of-life care with morphine , Ativan , Haldol , Robinul  prn.  DVT prophylaxis: none Code Status:   Code Status: Do not attempt resuscitation (DNR) - Comfort care Family Communication: plan of care discussed with daughter at bedside. Patient status is: Remains hospitalized because of severity of illness Level of care: Palliative Care    Dispo: The patient is from: home            Anticipated disposition: Anticipating hospital death   Objective: Vitals last 24 hrs: Vitals:   05/14/23 1742 05/14/23 1801 05/14/23 2017 05/15/23 1216  BP: (!) 167/102 121/88 (!) 149/72 133/71  Pulse: (!) 105 91 80 (!) 130  Resp: 20 20 20    Temp: (!) 97.5 F (36.4 C)  97.8 F (36.6 C) 97.7 F (36.5 C)  TempSrc: Oral  Oral Oral  SpO2:  97% (!) 78% (!) 83%  Weight:      Height:       Weight change:   Physical Examination: General exam: Minimally responsive.   HEENT:Oral mucosa moist, Ear/Nose WNL grossly Respiratory system: Bilaterally diminished BS,no use of accessory muscle Cardiovascular system: S1 & S2 +, No JVD. Gastrointestinal system: Abdomen soft,NT,ND, BS+ Nervous System: Minimally responsive  Extremities: LE edema neg,distal peripheral pulses palpable and warm.  Skin: No rashes,no icterus. MSK: thin muscle bulk,tone, power   Medications reviewed:  Scheduled Meds: Continuous Infusions: Diet Order             Diet regular Room service appropriate? Yes; Fluid consistency: Thin  Diet effective now                   Intake/Output Summary (Last 24 hours) at 05/15/2023 1257 Last data filed at 05/14/2023 1817 Gross per 24 hour  Intake --  Output 200 ml  Net -200 ml   Net IO Since Admission: 6,875.96 mL [05/15/23 1257]  Wt Readings from Last 3 Encounters:  05/11/23 53.9 kg  01/19/23 56.1 kg  12/14/22 58.2 kg     Unresulted Labs (From admission, onward)    None      Data Reviewed: I have personally reviewed following labs and  imaging studies CBC: Recent Labs  Lab 05/09/23 1527 05/10/23 0313 05/11/23 0326  WBC 24.5* 17.6* 22.5*  NEUTROABS 23.1*  --   --   HGB 11.6* 9.1* 9.0*  HCT 40.6 30.1* 29.7*  MCV 95.1 94.4 94.0  PLT 291 183 205   Basic Metabolic Panel:  Recent Labs  Lab 05/10/23 0313 05/10/23 1537 05/10/23 2132 05/11/23 0326 05/11/23 0952  NA 155* 150* 150* 148* 145  K 3.8 3.0* 3.1*  4.0 3.4*  CL 125* 124* 124* 123* 123*  CO2 24 22 21* 21* 18*  GLUCOSE 136* 147* 122* 145* 155*  BUN 34* 33* 34* 35* 34*  CREATININE 1.19 0.97 1.01 0.89 0.86  CALCIUM  7.8* 7.5* 7.4* 7.5* 7.3*  MG 2.1  --   --  2.3  --   PHOS 3.4  --   --   --   --    GFR: Estimated Creatinine Clearance: 44.4 mL/min (by C-G formula based on SCr of 0.86 mg/dL). Liver Function Tests:  Recent Labs  Lab 05/09/23 1527  AST 28  ALT 23  ALKPHOS 65  BILITOT 0.8  PROT 6.8  ALBUMIN 2.2*   No results for input(s): LIPASE, AMYLASE in the last 168 hours.  Recent Labs  Lab 05/10/23 0313  AMMONIA 14   Coagulation Profile: No results for input(s): INR, PROTIME in the last 168 hours. No results for input(s): PROBNP in the last 168 hours.  No results for input(s): HGBA1C in the last 72 hours. Recent Labs  Lab 05/10/23 1712 05/10/23 2019 05/11/23 0005 05/11/23 0312 05/11/23 0836  GLUCAP 135* 142* 132* 142* 136*   No results for input(s): CHOL, HDL, LDLCALC, TRIG, CHOLHDL, LDLDIRECT in the last 72 hours. No results for input(s): TSH, T4TOTAL, FREET4, T3FREE, THYROIDAB in the last 72 hours. Sepsis Labs: Recent Labs  Lab 05/09/23 1634 05/09/23 1755 05/10/23 0313 05/11/23 0326  PROCALCITON  --   --  4.42 4.02  LATICACIDVEN 4.6* 6.2* 1.9  --    Recent Results (from the past 240 hours)  Blood Culture (routine x 2)     Status: None   Collection Time: 05/09/23  3:27 PM   Specimen: BLOOD  Result Value Ref Range Status   Specimen Description   Final    BLOOD LEFT ANTECUBITAL Performed at [facility], [address] [place], [place] [address]    Special Requests   Final    BOTTLES DRAWN AEROBIC AND ANAEROBIC Blood Culture adequate volume Performed at [facility], [address], [place], [place] [address]    Culture   Final    NO GROWTH 5 DAYS Performed at [facility], [address] [place], [place] [address]     Report Status 05/14/2023 FINAL  Final  Blood Culture (routine x 2)     Status: None   Collection Time: 05/09/23  3:40 PM   Specimen: BLOOD  Result Value Ref Range Status   Specimen Description   Final    BLOOD RIGHT ANTECUBITAL Performed at [facility], [address], [place], [place] [address]    Special Requests   Final    BOTTLES DRAWN AEROBIC AND ANAEROBIC Blood Culture adequate volume Performed at [facility], [address] [place], [place] [address]    Culture   Final    NO GROWTH 5 DAYS Performed at [facility], [address], [place], [place] [address]    Report Status 05/14/2023 FINAL  Final  Resp panel by RT-PCR (RSV, Flu A&B, Covid) Anterior Nasal Swab     Status: Abnormal   Collection Time: 05/09/23  4:05 PM   Specimen: Anterior Nasal Swab  Result Value Ref Range Status   SARS Coronavirus 2 by RT PCR POSITIVE (A) NEGATIVE Final    Comment: (NOTE) SARS-CoV-2 target nucleic acids are DETECTED.  The SARS-CoV-2 RNA is generally detectable in upper respiratory specimens during the acute phase of infection. Positive results are indicative of the presence of the identified virus, but do not rule out bacterial infection or co-infection with other pathogens not detected by the test. Clinical correlation with patient history and other diagnostic information is necessary to determine patient infection status. The expected result is Negative.  Fact Sheet for Patients: bloggercourse.com  Fact Sheet for Healthcare Providers: seriousbroker.it  This test is not yet approved or cleared by the [place]  FDA and  has been authorized for detection and/or diagnosis of SARS-CoV-2 by FDA under an Emergency Use Authorization (EUA).  This EUA will remain in effect (meaning this test can be used) for the duration of  the COVID-19 declaration under Section 564(b)(1) of the A ct,  21 U.S.C. section 360bbb-3(b)(1), unless the authorization is terminated or revoked sooner.     Influenza A by PCR NEGATIVE NEGATIVE Final   Influenza B by PCR NEGATIVE NEGATIVE Final    Comment: (NOTE) The Xpert Xpress SARS-CoV-2/FLU/RSV plus assay is intended as an aid in the diagnosis of influenza from Nasopharyngeal swab specimens and should not be used as a sole basis for treatment. Nasal washings and aspirates are unacceptable for Xpert Xpress SARS-CoV-2/FLU/RSV testing.  Fact Sheet for Patients: bloggercourse.com  Fact Sheet for Healthcare Providers: seriousbroker.it  This test is not yet approved or cleared by the [place]  FDA and has been authorized for detection and/or diagnosis of SARS-CoV-2 by FDA under an Emergency Use Authorization (EUA). This EUA will remain in effect (meaning this test can be used) for the duration of the COVID-19 declaration under Section 564(b)(1) of the Act, 21 U.S.C. section 360bbb-3(b)(1), unless the authorization is terminated or revoked.     Resp Syncytial Virus by PCR NEGATIVE NEGATIVE Final    Comment: (NOTE) Fact Sheet for Patients: bloggercourse.com  Fact Sheet for Healthcare Providers: seriousbroker.it  This test is not yet approved or cleared by the [place]  FDA and has been authorized for detection and/or diagnosis of SARS-CoV-2 by FDA under an Emergency Use Authorization (EUA). This EUA will remain in effect (meaning this test can be used) for the duration of the COVID-19 declaration under Section 564(b)(1) of the Act, 21 U.S.C. section 360bbb-3(b)(1), unless the authorization is terminated or revoked.  Performed at [facility], [address] [place], [place] [address]   Urine Culture     Status: Abnormal   Collection Time: 05/09/23  4:13 PM   Specimen: Urine, Random  Result Value Ref Range  Status   Specimen Description   Final    URINE, RANDOM Performed at [facility], [address], [place], [place] [address]    Special Requests   Final    NONE Reflexed from [id number] Performed at [facility], [address] [place], [place] [address]    Culture (A)  Final    >=100,000 COLONIES/mL ESCHERICHIA COLI >=100,000 COLONIES/mL PSEUDOMONAS AERUGINOSA    Report Status 05/13/2023 FINAL  Final   Organism ID, Bacteria PSEUDOMONAS AERUGINOSA (A)  Final   Organism ID, Bacteria ESCHERICHIA COLI (A)  Final      Susceptibility   Escherichia coli - MIC*    AMPICILLIN <=2 SENSITIVE Sensitive     CEFAZOLIN  <=4 SENSITIVE Sensitive     CEFEPIME  <=0.12 SENSITIVE Sensitive     CEFTRIAXONE  <=0.25 SENSITIVE Sensitive     CIPROFLOXACIN  <=0.25 SENSITIVE Sensitive     GENTAMICIN <=1 SENSITIVE Sensitive     IMIPENEM <=0.25 SENSITIVE Sensitive     NITROFURANTOIN  <=16 SENSITIVE Sensitive     TRIMETH /SULFA  <=20 SENSITIVE Sensitive     AMPICILLIN/SULBACTAM <=2 SENSITIVE Sensitive     PIP/TAZO <=4 SENSITIVE Sensitive ug/mL    * >=100,000 COLONIES/mL ESCHERICHIA COLI   Pseudomonas aeruginosa - MIC*    CEFTAZIDIME 4 SENSITIVE Sensitive     CIPROFLOXACIN  <=0.25 SENSITIVE Sensitive     GENTAMICIN <=1 SENSITIVE Sensitive     IMIPENEM 2 SENSITIVE Sensitive     PIP/TAZO 16 SENSITIVE Sensitive ug/mL    CEFEPIME  2 SENSITIVE Sensitive     * >=100,000 COLONIES/mL PSEUDOMONAS AERUGINOSA  MRSA Next Gen by PCR, Nasal     Status: Abnormal   Collection Time: 05/10/23  1:29 AM   Specimen: Nasal Mucosa; Nasal Swab  Result Value Ref Range Status   MRSA by PCR Next Gen DETECTED (A) NOT DETECTED Final    Comment: (NOTE) The GeneXpert MRSA Assay (FDA approved for NASAL specimens only), is one component of a comprehensive MRSA colonization surveillance program. It is not intended to diagnose MRSA infection nor to guide or monitor treatment for MRSA infections. Test  performance is not FDA approved in patients less than 56 years old. Performed at [facility], [address], [place], [place] [address]     Antimicrobials/Microbiology: Anti-infectives (From admission, onward)    Start     Dose/Rate Route Frequency Ordered Stop   05/11/23 1200  ceFEPIme  (MAXIPIME ) 2 g in sodium chloride  0.9 % 100 mL IVPB  Status:  Discontinued        2 g 200 mL/hr over 30 Minutes Intravenous Every 12 hours 05/11/23 1057 05/12/23 0956   05/10/23 1700  azithromycin  (ZITHROMAX ) 500 mg in sodium chloride  0.9 % 250 mL IVPB        500 mg 250 mL/hr over 60 Minutes Intravenous Every 24 hours 05/09/23 2149 05/11/23 1804   05/10/23 1600  cefTRIAXone  (ROCEPHIN ) 1 g in sodium chloride  0.9 % 100 mL IVPB  Status:  Discontinued        1 g 200 mL/hr over 30 Minutes Intravenous Every 24 hours 05/09/23 2149 05/11/23 1057   05/10/23 1000  remdesivir  100 mg in sodium chloride  0.9 % 100 mL IVPB  Status:  Discontinued       Placed in Followed by Linked Group   100 mg 200 mL/hr over 30 Minutes Intravenous Daily 05/09/23 2158 05/09/23 2202   05/10/23 1000  remdesivir  100 mg in sodium chloride  0.9 % 100 mL IVPB        100 mg 200 mL/hr over 30 Minutes Intravenous Daily 05/09/23 2204 05/11/23 1045   05/09/23 2245  remdesivir  100 mg in sodium chloride  0.9 % 100 mL IVPB       Placed in Followed by Linked Group   100 mg 200 mL/hr over 30 Minutes Intravenous  Once 05/09/23 2200 05/10/23 0304   05/09/23 2200  vancomycin  (VANCOREADY) IVPB 1250 mg/250 mL        750 mg 100 mL/hr over 90 Minutes Intravenous  Once 05/09/23 2149 05/10/23 0303   05/09/23 2200  remdesivir  200 mg  in sodium chloride  0.9% 250 mL IVPB  Status:  Discontinued       Placed in Followed by Linked Group   200 mg 580 mL/hr over 30 Minutes Intravenous Once 05/09/23 2158 05/09/23 2202   05/09/23 2200  remdesivir  100 mg in sodium chloride  0.9 % 100 mL IVPB       Placed in Followed by Linked Group   100  mg 200 mL/hr over 30 Minutes Intravenous  Once 05/09/23 2200 05/10/23 0524   05/09/23 1530  cefTRIAXone  (ROCEPHIN ) 2 g in sodium chloride  0.9 % 100 mL IVPB        2 g 200 mL/hr over 30 Minutes Intravenous Once 05/09/23 1529 05/09/23 1643   05/09/23 1530  azithromycin  (ZITHROMAX ) 500 mg in sodium chloride  0.9 % 250 mL IVPB        500 mg 250 mL/hr over 60 Minutes Intravenous  Once 05/09/23 1529 05/09/23 1750         Component Value Date/Time   SDES  05/09/2023 1613    URINE, RANDOM Performed at [facility], [address], [place], [place] [address]    SPECREQUEST  05/09/2023 1613    NONE Reflexed from [id number] Performed at [facility], [address], [place], [place] [address]    CULT (A) 05/09/2023 1613    >=100,000 COLONIES/mL ESCHERICHIA COLI >=100,000 COLONIES/mL PSEUDOMONAS AERUGINOSA    REPTSTATUS 05/13/2023 FINAL 05/09/2023 1613     Radiology Studies: No results found.   LOS: 6 days   Total time spent in review of labs and imaging, patient evaluation, formulation of plan, documentation and communication with family: 35 minutes  Mennie LAMY, MD [facility]  05/15/2023, 12:57 PM

## 2023-05-15 NOTE — Progress Notes (Signed)
 Nutrition Brief Note  Chart reviewed. Pt transitioned to comfort care.  No further nutrition interventions planned at this time.   Shelle Iron RD, LDN Contact via [employer].

## 2023-05-15 NOTE — TOC Initial Note (Signed)
 Transition of Care (TOC) - Initial/Assessment Note    Patient Details  Name: [name]. MRN: [id number] Date of Birth: 07/13/1931  Transition of Care [facility]) CM/SW Contact:    Dalila Camellia SAUNDERS, LCSW Phone Number: 05/15/2023, 5:27 PM  Clinical Narrative:                  Patient is an 89 year old male who is alert and oriented x1.  Patient has been at [facility] under private pay since November.  Patient's daughter [name], stated that she has paid for a bed hold at [facility].  Palliative met with patient's family to discuss goals of care.  Daughter is hoping patient improves and is able to return back to [facility].  Daughter expressed that if he does not improve they would prefer [facility] for inpatient hospice if he needs it.  Palliative will be meeting with patient's family tomorrow.  CSW to follow up with patient's family and palliative tomorrow.  TOC to continue to follow patient's progress throughout discharge planning.  Expected Discharge Plan: Skilled Nursing Facility Barriers to Discharge: Continued Medical Work up   Patient Goals and CMS Choice Patient states their goals for this hospitalization and ongoing recovery are:: Per daughter to return back to [facility] unless patient gets worse. CMS Medicare.gov Compare Post Acute Care list provided to:: Patient Represenative (must comment) (Patient's daughter [name]) Choice offered to / list presented to : Adult Children [place] ownership interest in [facility].provided to:: Adult Children    Expected Discharge Plan and Services     Post Acute Care Choice: Skilled Nursing Facility Living arrangements for the past 2 months: Skilled Nursing Facility                                      Prior Living Arrangements/Services Living arrangements for the past 2 months: Skilled Nursing Facility Lives with:: Facility Resident Patient language and need for interpreter reviewed:: Yes Do you  feel safe going back to the place where you live?: Yes      Need for Family Participation in Patient Care: Yes (Comment) Care giver support system in place?: Yes (comment)   Criminal Activity/Legal Involvement Pertinent to Current Situation/Hospitalization: No - Comment as needed  Activities of Daily Living   ADL Screening (condition at time of admission) Independently performs ADLs?: No Does the patient have a NEW difficulty with bathing/dressing/toileting/self-feeding that is expected to last >3 days?: No Does the patient have a NEW difficulty with getting in/out of bed, walking, or climbing stairs that is expected to last >3 days?: No Does the patient have a NEW difficulty with communication that is expected to last >3 days?: No Is the patient deaf or have difficulty hearing?: No Does the patient have difficulty seeing, even when wearing glasses/contacts?: Yes Does the patient have difficulty concentrating, remembering, or making decisions?: Yes  Permission Sought/Granted Permission sought to share information with : Facility Medical Sales Representative, Case Manager, Family Supports Permission granted to share information with : Yes, Verbal Permission Granted, Yes, Release of Information Signed  Share Information with NAME: Smith,Michelle Daughter [phone]  [phone]  Permission granted to share info w AGENCY: SNF admissions        Emotional Assessment Appearance:: Appears stated age Attitude/Demeanor/Rapport: Lethargic Affect (typically observed): Calm, Appropriate, Accepting, Stable Orientation: : Oriented to Self Alcohol  / Substance Use: Not Applicable Psych Involvement:  No (comment)  Admission diagnosis:  Dehydration [E86.0] Hypokalemia [E87.6] Hypernatremia [E87.0] Hypoalbuminemia [E88.09] Severe protein-calorie malnutrition (HCC) [E43] Acute respiratory infection [J22] Acute febrile illness [R50.9] AKI (acute kidney injury) (HCC) [N17.9] Elevated lactic acid level  [R79.89] Sepsis due to urinary tract infection (HCC) [A41.9, N39.0] Sepsis (HCC) [A41.9] COVID-19 virus infection [U07.1] Patient Active Problem List   Diagnosis Date Noted   Medication management 05/14/2023   Dehydration 05/12/2023   FTT (failure to thrive) in adult 05/11/2023   Aspiration pneumonia (HCC) 05/11/2023   Encounter for palliative care 05/11/2023   Pressure injury of skin 05/11/2023   COVID-19 virus infection 05/10/2023   Elevated lactic acid level 05/10/2023   Need for emotional support 05/10/2023   DNR (do not resuscitate) 05/10/2023   Goals of care, counseling/discussion 05/10/2023   Counseling and coordination of care 05/10/2023   Sepsis due to urinary tract infection (HCC) 05/10/2023   Septic shock (HCC) 05/10/2023   Severe protein-calorie malnutrition (HCC) 05/10/2023   Palliative care encounter 05/10/2023   Sepsis (HCC) 05/09/2023   Protein-calorie malnutrition, severe 12/19/2022   Urinary retention 12/19/2022   Frequent falls 12/17/2022   Acute metabolic encephalopathy 12/17/2022   Chronic HFrEF with improved EF(heart failure with reduced ejection fraction) (HCC) 12/17/2022   At high risk for bleeding 12/17/2022   Ischemic cardiomyopathy 12/17/2022   SIRS (systemic inflammatory response syndrome) (HCC) 12/17/2022   Hypokalemia 12/17/2022   Subarachnoid hemorrhage (HCC) 12/15/2022   History of recent fall 12/14/2022   Encounter for prostate cancer screening 07/20/2022   Acute cystitis without hematuria 07/20/2022   Urinary frequency 07/20/2022   Hoarse voice quality 11/14/2020   CAD S/P LAD stent 2022 07/07/2020   Cancer (HCC)    PAF (paroxysmal atrial fibrillation) (HCC) 06/25/2020   NSTEMI (non-ST elevated myocardial infarction) (HCC) 06/21/2020   History of prostate cancer    Other fatigue 02/05/2020   Anemia 02/05/2020   Moderate persistent asthma, uncomplicated 01/21/2020   Asthma-COPD overlap syndrome (HCC) 01/21/2020   Acquired hypothyroidism  08/15/2019   Mixed conductive and sensorineural hearing loss, 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   Malnutrition of moderate degree (HCC) 08/15/2019   Senile osteoporosis 08/15/2019   Nonsustained ventricular tachycardia (HCC) 01/30/2019   Frequent PVCs 11/28/2018   Essential hypertension 11/28/2018   LVH (left ventricular hypertrophy) 11/28/2018   Hyperlipidemia, mixed 08/21/2008   SINUSITIS, ACUTE 06/28/2007   Seasonal and perennial allergic rhinitis 05/17/2007   PCP:  Sherre Clapper, MD Pharmacy:   [facility] [place], [place] - [address] [place] [address] Phone: [phone] Fax: [phone]     Social Drivers of Health (SDOH) Social History: SDOH Screenings   Food Insecurity: Unknown (05/10/2023)  Housing: Low Risk  (12/16/2022)  Transportation Needs: Patient Unable To Answer (05/10/2023)  Utilities: Not At Risk (12/16/2022)  Alcohol  Screen: Low Risk  (07/08/2022)  Depression (PHQ2-9): Low Risk  (07/26/2022)  Financial Resource Strain: Low Risk  (03/30/2022)  Physical Activity: Insufficiently Active (01/29/2021)  Social Connections: Socially Integrated (01/29/2021)  Stress: No Stress Concern Present (01/29/2021)  Tobacco Use: Medium Risk (05/13/2023)   SDOH Interventions: Transportation Interventions: Intervention Not Indicated   Readmission Risk Interventions    05/15/2023    5:23 PM  Readmission Risk Prevention Plan  Transportation Screening Complete  PCP or Specialist Appt within 3-5 Days Complete  HRI or Home Care Consult Not Complete  HRI or Home Care Consult comments Patient at [facility] under private pay, HH not applicable.  Social Work Librarian, Academic for Recovery  Care Planning/Counseling Complete  Palliative Care Screening Complete  Medication Review (RN Care Manager) Referral to Pharmacy

## 2023-05-16 DIAGNOSIS — R531 Weakness: Secondary | ICD-10-CM | POA: Diagnosis not present

## 2023-05-16 DIAGNOSIS — Z515 Encounter for palliative care: Secondary | ICD-10-CM | POA: Diagnosis not present

## 2023-05-16 DIAGNOSIS — A419 Sepsis, unspecified organism: Secondary | ICD-10-CM | POA: Diagnosis not present

## 2023-05-16 DIAGNOSIS — Z7189 Other specified counseling: Secondary | ICD-10-CM | POA: Diagnosis not present

## 2023-05-16 DIAGNOSIS — N171 Acute kidney failure with acute cortical necrosis: Secondary | ICD-10-CM | POA: Diagnosis not present

## 2023-05-16 DIAGNOSIS — R6520 Severe sepsis without septic shock: Secondary | ICD-10-CM | POA: Diagnosis not present

## 2023-05-16 NOTE — TOC Progression Note (Addendum)
 Transition of Care (TOC) - Progression Note    Patient Details  Name: James Magnan Sr. MRN: [id number] Date of Birth: 12-02-1931  Transition of Care [facility]) CM/SW Contact  Dalila Camellia [place], [place] Phone Number: 05/16/2023, 1:33 PM  Clinical Narrative:     CSW was informed that patient's family would like [facility] for hospice facility placement.  CSW made referral to Shawn with [facility].  TOC awaiting for notification fo bed availability.  4:15pm CSW received message from [place] at [facility] regarding [facility].  Per Elouise he will be meeting with the patient and family tomorrow morning.  TOC to continue to follow patient's progress throughout discharge planning.  Expected Discharge Plan: Skilled Nursing Facility Barriers to Discharge: Continued Medical Work up  Expected Discharge Plan and Services     Post Acute Care Choice: Skilled Nursing Facility Living arrangements for the past 2 months: Skilled Nursing Facility                                       Social Determinants of Health (SDOH) Interventions SDOH Screenings   Food Insecurity: Unknown (05/10/2023)  Housing: Low Risk  (12/16/2022)  Transportation Needs: Patient Unable To Answer (05/10/2023)  Utilities: Not At Risk (12/16/2022)  Alcohol  Screen: Low Risk  (07/08/2022)  Depression (PHQ2-9): Low Risk  (07/26/2022)  Financial Resource Strain: Low Risk  (03/30/2022)  Physical Activity: Insufficiently Active (01/29/2021)  Social Connections: Socially Integrated (01/29/2021)  Stress: No Stress Concern Present (01/29/2021)  Tobacco Use: Medium Risk (05/13/2023)    Readmission Risk Interventions    05/15/2023    5:23 PM  Readmission Risk Prevention Plan  Transportation Screening Complete  PCP or Specialist Appt within 3-5 Days Complete  HRI or Home Care Consult Not Complete  HRI or Home Care Consult comments Patient at [facility] under private pay, [facility] not applicable.  Social Work Consult for Recovery Care  Planning/Counseling Complete  Palliative Care Screening Complete  Medication Review Oceanographer) Referral to Pharmacy

## 2023-05-16 NOTE — Progress Notes (Signed)
 Daily Progress Note   Patient Name: James Ermis Sr.       Date: 05/16/2023 DOB: November 29, 1931  Age: 89 y.o. MRN#: [id number] Attending Physician: Christobal Guadalajara, MD Primary Care Physician: Sherre Clapper, MD Admit Date: 05/09/2023 Length of Stay: 7 days  Reason for Consultation/Follow-up: Establishing goals of care  Subjective:   CC: Patient laying in bed resting comfortably.  Occasionally he has some episodic yawning grunting movements.  Following up regarding complex medical decision making.   Subjective:  Reviewed EMR prior to presenting to bedside.  Continues on morphine  oral medications since patient lost IV access and this could not be reobtained.  Presented to bedside to meet with patient and family.  Patient's daughter at bedside.  Discussed with patient's daughter about patient's current condition, comfort measures, end-of-life care.   Spent time answering questions as able.  Provided emotional support via active listening.  Thanked daughter for allowing me to visit with her and her father today.    Objective:   Vital Signs:  BP 120/67 (BP Location: Left Arm)   Pulse (!) 46   Temp 98.4 F (36.9 C) (Oral)   Resp (!) 24   Ht 6' (1.829 m)   Wt 53.9 kg   SpO2 (!) 81%   BMI 16.12 kg/m   Physical Exam: General: sleeping, cachectic, frail, ill-appearing Cardiovascular: RRR Respiratory: Mild open, shallow breath sounds Skin: Multiple ecchymoses present on upper and lower extremities b/l  Imaging: I personally reviewed recent imaging.   Assessment & Plan:   Assessment:   Patient is an 89 year old male with a past medical history of subarachnoid hemorrhage (01/2023), frequent falls, paroxysmal A-fib, CAD status post LAD 2022, hypertension, hypothyroidism, mixed conductive and sensorineural hearing loss, asthma, anemia of chronic disease, urinary retention, dyslipidemia, GERD, depression/anxiety, and severe protein calorie malnutrition who was admitted on 05/09/2023 from SNF  for management of hypoxia of 88% on room air, generalized weakness, altered mental status, and fever.  During hospitalization patient has required admission to the ICU for management of septic shock secondary to left lower lobe pneumonia in setting of COVID-positive and possible aspiration pneumonia with known history of dysphagia.  Patient also receiving management for urinary tract infection, AKI, urinary retention, and DTI on heels/sacrum/bilateral hips that were reported to be present on admission.  Palliative medicine team consulted to assist with complex medical decision making.  Recommendations/Plan: # Complex medical decision making/goals of care:                -Patient transitioned to full comfort focused care on 05/12/23; To be continued in hospital.  Will not perform further aggressive medical interventions and instead focus on patient's symptom management.  Patient with minimal to nail oral intake for the last 24 to 48 hours, aspiration risk.    Continuing comfort focused care at this time.                Code Status: Do not attempt resuscitation (DNR) - Comfort care   # Symptoms Management  -Continue medications aimed at patient's comfort such as morphine  for pain/shortness of breath, Ativan  for agitation, and Robinul  for secretions.  Has oral medications at this time due to loss of IV access.     # Psycho-social/Spiritual Support:  - Support System: Daughter, son-in-law, wife (multiple medical conditions including stroke preventing engagement in complex medical decision making), sister   # Discharge Planning: Continuing comfort focused care at this time with [facility] vs could consider inpatient hospice referral: 1-7,  discussed with patient's daughter, she is agreeable to proceed with the residential hospice evaluation, will request TOC.  Discussed with:   patient's daughter, hospitalist   Thank you for allowing the palliative care team to participate in the care [facility] GomezSABRA Ngo MDM Lonia Serve MD Palliative Care Provider PMT # [phone]  If patient remains symptomatic despite maximum doses, please call PMT at [phone] between 0700 and 1900. Outside of these hours, please call attending, as PMT does not have night coverage.  *Please note that this is a verbal dictation therefore any spelling or grammatical errors are due to the Dragon [employer] One system interpretation.

## 2023-05-16 NOTE — Progress Notes (Signed)
 VSS, continues on room air. Pt resting comfortably in bed, no distress noted. Comfort measures in place, safety maintained. Bed alarm on. Will continue to monitor.

## 2023-05-16 NOTE — Plan of Care (Signed)
 Problem: Education: Goal: Knowledge of risk factors and measures for prevention of condition will improve 05/16/2023 1911 by Lorene Devere SQUIBB, RN Outcome: Progressing 05/16/2023 1910 by Lorene Devere SQUIBB, RN Outcome: Progressing   Problem: Coping: Goal: Psychosocial and spiritual needs will be supported 05/16/2023 1911 by Lorene Devere SQUIBB, RN Outcome: Progressing 05/16/2023 1910 by Lorene Devere SQUIBB, RN Outcome: Progressing   Problem: Respiratory: Goal: Will maintain a patent airway 05/16/2023 1911 by Lorene Devere SQUIBB, RN Outcome: Progressing 05/16/2023 1910 by Lorene Devere SQUIBB, RN Outcome: Progressing Goal: Complications related to the disease process, condition or treatment will be avoided or minimized 05/16/2023 1911 by Lorene Devere SQUIBB, RN Outcome: Progressing 05/16/2023 1910 by Lorene Devere SQUIBB, RN Outcome: Progressing   Problem: Education: Goal: Knowledge of General Education information will improve Description: Including pain rating scale, medication(s)/side effects and non-pharmacologic comfort measures 05/16/2023 1911 by Lorene Devere SQUIBB, RN Outcome: Progressing 05/16/2023 1910 by Lorene Devere SQUIBB, RN Outcome: Progressing   Problem: Health Behavior/Discharge Planning: Goal: Ability to manage health-related needs will improve 05/16/2023 1911 by Lorene Devere SQUIBB, RN Outcome: Progressing 05/16/2023 1910 by Lorene Devere SQUIBB, RN Outcome: Progressing   Problem: Clinical Measurements: Goal: Ability to maintain clinical measurements within normal limits will improve 05/16/2023 1911 by Lorene Devere SQUIBB, RN Outcome: Progressing 05/16/2023 1910 by Lorene Devere SQUIBB, RN Outcome: Progressing Goal: Will remain free from infection 05/16/2023 1911 by Lorene Devere SQUIBB, RN Outcome: Progressing 05/16/2023 1910 by Lorene Devere SQUIBB, RN Outcome: Progressing Goal: Diagnostic test results will improve 05/16/2023 1911 by Lorene Devere SQUIBB, RN Outcome: Progressing 05/16/2023 1910 by Lorene Devere SQUIBB, RN Outcome:  Progressing Goal: Respiratory complications will improve 05/16/2023 1911 by Lorene Devere SQUIBB, RN Outcome: Progressing 05/16/2023 1910 by Lorene Devere SQUIBB, RN Outcome: Progressing Goal: Cardiovascular complication will be avoided 05/16/2023 1911 by Lorene Devere SQUIBB, RN Outcome: Progressing 05/16/2023 1910 by Lorene Devere SQUIBB, RN Outcome: Progressing   Problem: Activity: Goal: Risk for activity intolerance will decrease 05/16/2023 1911 by Lorene Devere SQUIBB, RN Outcome: Progressing 05/16/2023 1910 by Lorene Devere SQUIBB, RN Outcome: Progressing   Problem: Nutrition: Goal: Adequate nutrition will be maintained 05/16/2023 1911 by Lorene Devere SQUIBB, RN Outcome: Progressing 05/16/2023 1910 by Lorene Devere SQUIBB, RN Outcome: Progressing   Problem: Coping: Goal: Level of anxiety will decrease 05/16/2023 1911 by Lorene Devere SQUIBB, RN Outcome: Progressing 05/16/2023 1910 by Lorene Devere SQUIBB, RN Outcome: Progressing   Problem: Elimination: Goal: Will not experience complications related to bowel motility 05/16/2023 1911 by Lorene Devere SQUIBB, RN Outcome: Progressing 05/16/2023 1910 by Lorene Devere SQUIBB, RN Outcome: Progressing Goal: Will not experience complications related to urinary retention 05/16/2023 1911 by Lorene Devere SQUIBB, RN Outcome: Progressing 05/16/2023 1910 by Lorene Devere SQUIBB, RN Outcome: Progressing   Problem: Pain Management: Goal: General experience of comfort will improve 05/16/2023 1911 by Lorene Devere SQUIBB, RN Outcome: Progressing 05/16/2023 1910 by Lorene Devere SQUIBB, RN Outcome: Progressing   Problem: Safety: Goal: Ability to remain free from injury will improve 05/16/2023 1911 by Lorene Devere SQUIBB, RN Outcome: Progressing 05/16/2023 1910 by Lorene Devere SQUIBB, RN Outcome: Progressing   Problem: Skin Integrity: Goal: Risk for impaired skin integrity will decrease 05/16/2023 1911 by Lorene Devere SQUIBB, RN Outcome: Progressing 05/16/2023 1910 by Lorene Devere SQUIBB, RN Outcome: Progressing   Problem:  Education: Goal: Knowledge of the prescribed therapeutic regimen will improve 05/16/2023 1911 by Lorene Devere SQUIBB, RN Outcome: Progressing 05/16/2023 1910 by Lorene Devere SQUIBB, RN Outcome: Progressing   Problem: Coping: Goal: Ability  to identify and develop effective coping behavior will improve 05/16/2023 1911 by Lorene Devere SQUIBB, RN Outcome: Progressing 05/16/2023 1910 by Lorene Devere SQUIBB, RN Outcome: Progressing   Problem: Clinical Measurements: Goal: Quality of life will improve 05/16/2023 1911 by Lorene Devere SQUIBB, RN Outcome: Progressing 05/16/2023 1910 by Lorene Devere SQUIBB, RN Outcome: Progressing   Problem: Respiratory: Goal: Verbalizations of increased ease of respirations will increase 05/16/2023 1911 by Lorene Devere SQUIBB, RN Outcome: Progressing 05/16/2023 1910 by Lorene Devere SQUIBB, RN Outcome: Progressing   Problem: Role Relationship: Goal: Family's ability to cope with current situation will improve 05/16/2023 1911 by Lorene Devere SQUIBB, RN Outcome: Progressing 05/16/2023 1910 by Lorene Devere SQUIBB, RN Outcome: Progressing Goal: Ability to verbalize concerns, feelings, and thoughts to partner or family member will improve 05/16/2023 1911 by Lorene Devere SQUIBB, RN Outcome: Progressing 05/16/2023 1910 by Lorene Devere SQUIBB, RN Outcome: Progressing   Problem: Pain Management: Goal: Satisfaction with pain management regimen will improve 05/16/2023 1911 by Lorene Devere SQUIBB, RN Outcome: Progressing 05/16/2023 1910 by Lorene Devere SQUIBB, RN Outcome: Progressing

## 2023-05-16 NOTE — Progress Notes (Signed)
 Patient resting in bed. RR equal and unlabored. Safety measures remain in place. Oncoming RN made aware of DNR status. Handoff report completed at bedside with [facility], RN.

## 2023-05-16 NOTE — Progress Notes (Signed)
 PROGRESS NOTE [name].  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 Since then he has had progressive decline in functionality. He had been admitted to ICU with COVID and LLL and possibly aspiration pneumonia complicated by sepsis, metabolic encephalopathy requiring low-dose pressors. Palliative care was involved and patient has been transitioned over to comfort care.   Subjective: Patient seen examined this morning daughter and other family at bedside  Minimally responsive at times moves mouth on tactile stimulation Appears comfortable received morphine  last night Hypoxic at 81% on room air- holding of on O2 as he is not in distress   Assessment and Plan: End-of-life care FTT in adult Severe malnutrition Acute metabolic encephalopathy  Acute hypoxic resp failure 2/2 PNA Septic shock LLL PNA Covid 19 PNA Aspiration PNA hx dysphagia UTI - polymicrobial (e coli, pseudomonas)  pAF CAD s/p LAD stent HTN HLD HFpEF  Hypothyroidism Asthma  Anemia of chronic dz/ Fe def  Urinary retention, chronic foley  Hx mechanical fall Hx rSAH  GERD  Depression/ anxiety  Dementia  DTIs, POA ;  Continue current plan of care with comfort measures for end-of-life care Discussed with palliative care and patient's family and daughter at the bedside and nursing staff Looking into [facility] to see if that is an option  DVT prophylaxis: none Code Status:   Code Status: Do not attempt resuscitation (DNR) - Comfort care Family Communication:  plan of care discussed with daughter at bedside. Patient status is: Remains hospitalized because of severity of illness Level of care: Palliative Care   Dispo: The patient is from: home            Anticipated disposition: Anticipating hospital death  vs [facility]  Objective: Vitals last 24 hrs: Vitals:   05/14/23 2017 05/15/23 1216 05/15/23 2228 05/16/23 0859  BP: (!) 149/72 133/71 (!) 156/111 120/67  Pulse: 80 (!) 130 80 (!) 46  Resp: 20  17 (!) 24  Temp: 97.8 F (36.6 C) 97.7 F (36.5 C) 98.2 F (36.8 C) 98.4 F (36.9 C)  TempSrc: Oral Oral Oral Oral  SpO2: (!) 78% (!) 83% 92% (!) 81%  Weight:      Height:       Weight change:   Physical Examination: General exam: Minimally responsive.   HEENT:Oral mucosa dry Respiratory system: Bilaterally diminished BS Cardiovascular system: S1 & S2 +, No JVD. Gastrointestinal system: Abdomen soft,NT,ND, BS+ Nervous System: Minimally responsive  Extremities: LE edema neg,legs are warm Skin: No rashes,no icterus. MSK: thin muscle bulk,tone, power   Medications reviewed:  Scheduled Meds: Continuous Infusions: Diet Order             Diet regular Room service appropriate? Yes; Fluid consistency: Thin  Diet effective now                  No intake or output data in the 24 hours ending 05/16/23 1132  Net IO Since Admission: 6,875.96 mL [05/16/23 1132]  Wt Readings from Last 3 Encounters:  05/11/23 53.9 kg  01/19/23 56.1 kg  12/14/22 58.2 kg     Unresulted Labs (From admission, onward)    None  Data Reviewed: I have personally reviewed following labs and imaging studies CBC: Recent Labs  Lab 05/09/23 1527 05/10/23 0313 05/11/23 0326  WBC 24.5* 17.6* 22.5*  NEUTROABS 23.1*  --   --   HGB 11.6* 9.1* 9.0*  HCT 40.6 30.1* 29.7*  MCV 95.1 94.4 94.0  PLT 291 183 205   Basic Metabolic Panel:  Recent Labs  Lab 05/10/23 0313 05/10/23 1537 05/10/23 2132 05/11/23 0326 05/11/23 0952  NA 155* 150* 150*  148* 145  K 3.8 3.0* 3.1* 4.0 3.4*  CL 125* 124* 124* 123* 123*  CO2 24 22 21* 21* 18*  GLUCOSE 136* 147* 122* 145* 155*  BUN 34* 33* 34* 35* 34*  CREATININE 1.19 0.97 1.01 0.89 0.86  CALCIUM  7.8* 7.5* 7.4* 7.5* 7.3*  MG 2.1  --   --  2.3  --   PHOS 3.4  --   --   --   --    GFR: Estimated Creatinine Clearance: 44.4 mL/min (by C-G formula based on SCr of 0.86 mg/dL). Liver Function Tests:  Recent Labs  Lab 05/09/23 1527  AST 28  ALT 23  ALKPHOS 65  BILITOT 0.8  PROT 6.8  ALBUMIN 2.2*   No results for input(s): LIPASE, AMYLASE in the last 168 hours.  Recent Labs  Lab 05/10/23 0313  AMMONIA 14   Coagulation Profile: No results for input(s): INR, PROTIME in the last 168 hours. No results for input(s): PROBNP in the last 168 hours.  No results for input(s): HGBA1C in the last 72 hours. Recent Labs  Lab 05/10/23 1712 05/10/23 2019 05/11/23 0005 05/11/23 0312 05/11/23 0836  GLUCAP 135* 142* 132* 142* 136*   No results for input(s): CHOL, HDL, LDLCALC, TRIG, CHOLHDL, LDLDIRECT in the last 72 hours. No results for input(s): TSH, T4TOTAL, FREET4, T3FREE, THYROIDAB in the last 72 hours. Sepsis Labs: Recent Labs  Lab 05/09/23 1634 05/09/23 1755 05/10/23 0313 05/11/23 0326  PROCALCITON  --   --  4.42 4.02  LATICACIDVEN 4.6* 6.2* 1.9  --    Recent Results (from the past 240 hours)  Blood Culture (routine x 2)     Status: None   Collection Time: 05/09/23  3:27 PM   Specimen: BLOOD  Result Value Ref Range Status   Specimen Description   Final    BLOOD LEFT ANTECUBITAL Performed at [facility], [address] [place], [place] [address]    Special Requests   Final    BOTTLES DRAWN AEROBIC AND ANAEROBIC Blood Culture adequate volume Performed at [facility], [address], [place], [place] [address]    Culture   Final    NO GROWTH 5 DAYS Performed at [facility], [address],  [place], [place] [address]    Report Status 05/14/2023 FINAL  Final  Blood Culture (routine x 2)     Status: None   Collection Time: 05/09/23  3:40 PM   Specimen: BLOOD  Result Value Ref Range Status   Specimen Description   Final    BLOOD RIGHT ANTECUBITAL Performed at [facility], [address] [place], [place] [address]    Special Requests   Final    BOTTLES DRAWN AEROBIC AND ANAEROBIC Blood Culture adequate volume Performed at [facility], [address] [place], [place] [address]    Culture   Final    NO GROWTH 5 DAYS Performed at [facility], [address] [place], [place] [address]  Report Status 05/14/2023 FINAL  Final  Resp panel by RT-PCR (RSV, Flu A&B, Covid) Anterior Nasal Swab     Status: Abnormal   Collection Time: 05/09/23  4:05 PM   Specimen: Anterior Nasal Swab  Result Value Ref Range Status   SARS Coronavirus 2 by RT PCR POSITIVE (A) NEGATIVE Final    Comment: (NOTE) SARS-CoV-2 target nucleic acids are DETECTED.  The SARS-CoV-2 RNA is generally detectable in upper respiratory specimens during the acute phase of infection. Positive results are indicative of the presence of the identified virus, but do not rule out bacterial infection or co-infection with other pathogens not detected by the test. Clinical correlation with patient history and other diagnostic information is necessary to determine patient infection status. The expected result is Negative.  Fact Sheet for Patients: bloggercourse.com  Fact Sheet for Healthcare Providers: seriousbroker.it  This test is not yet approved or cleared by the [place]  FDA and  has been authorized for detection and/or diagnosis of SARS-CoV-2 by FDA under an Emergency Use Authorization (EUA).  This EUA will remain in effect (meaning this test can be used) for the duration of  the COVID-19 declaration under Section  564(b)(1) of the A ct, 21 U.S.C. section 360bbb-3(b)(1), unless the authorization is terminated or revoked sooner.     Influenza A by PCR NEGATIVE NEGATIVE Final   Influenza B by PCR NEGATIVE NEGATIVE Final    Comment: (NOTE) The Xpert Xpress SARS-CoV-2/FLU/RSV plus assay is intended as an aid in the diagnosis of influenza from Nasopharyngeal swab specimens and should not be used as a sole basis for treatment. Nasal washings and aspirates are unacceptable for Xpert Xpress SARS-CoV-2/FLU/RSV testing.  Fact Sheet for Patients: bloggercourse.com  Fact Sheet for Healthcare Providers: seriousbroker.it  This test is not yet approved or cleared by the [place]  FDA and has been authorized for detection and/or diagnosis of SARS-CoV-2 by FDA under an Emergency Use Authorization (EUA). This EUA will remain in effect (meaning this test can be used) for the duration of the COVID-19 declaration under Section 564(b)(1) of the Act, 21 U.S.C. section 360bbb-3(b)(1), unless the authorization is terminated or revoked.     Resp Syncytial Virus by PCR NEGATIVE NEGATIVE Final    Comment: (NOTE) Fact Sheet for Patients: bloggercourse.com  Fact Sheet for Healthcare Providers: seriousbroker.it  This test is not yet approved or cleared by the [place]  FDA and has been authorized for detection and/or diagnosis of SARS-CoV-2 by FDA under an Emergency Use Authorization (EUA). This EUA will remain in effect (meaning this test can be used) for the duration of the COVID-19 declaration under Section 564(b)(1) of the Act, 21 U.S.C. section 360bbb-3(b)(1), unless the authorization is terminated or revoked.  Performed at [facility], [address], [place], [place] [address]   Urine Culture     Status: Abnormal   Collection Time: 05/09/23  4:13 PM   Specimen: Urine, Random   Result Value Ref Range Status   Specimen Description   Final    URINE, RANDOM Performed at [facility], [address], [place], [place] [address]    Special Requests   Final    NONE Reflexed from [id number] Performed at [facility], [address] [place], [place] [address]    Culture (A)  Final    >=100,000 COLONIES/mL ESCHERICHIA COLI >=100,000 COLONIES/mL PSEUDOMONAS AERUGINOSA    Report Status 05/13/2023 FINAL  Final   Organism ID, Bacteria PSEUDOMONAS AERUGINOSA (A)  Final  Organism ID, Bacteria ESCHERICHIA COLI (A)  Final      Susceptibility   Escherichia coli - MIC*    AMPICILLIN <=2 SENSITIVE Sensitive     CEFAZOLIN  <=4 SENSITIVE Sensitive     CEFEPIME  <=0.12 SENSITIVE Sensitive     CEFTRIAXONE  <=0.25 SENSITIVE Sensitive     CIPROFLOXACIN  <=0.25 SENSITIVE Sensitive     GENTAMICIN <=1 SENSITIVE Sensitive     IMIPENEM <=0.25 SENSITIVE Sensitive     NITROFURANTOIN  <=16 SENSITIVE Sensitive     TRIMETH /SULFA  <=20 SENSITIVE Sensitive     AMPICILLIN/SULBACTAM <=2 SENSITIVE Sensitive     PIP/TAZO <=4 SENSITIVE Sensitive ug/mL    * >=100,000 COLONIES/mL ESCHERICHIA COLI   Pseudomonas aeruginosa - MIC*    CEFTAZIDIME 4 SENSITIVE Sensitive     CIPROFLOXACIN  <=0.25 SENSITIVE Sensitive     GENTAMICIN <=1 SENSITIVE Sensitive     IMIPENEM 2 SENSITIVE Sensitive     PIP/TAZO 16 SENSITIVE Sensitive ug/mL    CEFEPIME  2 SENSITIVE Sensitive     * >=100,000 COLONIES/mL PSEUDOMONAS AERUGINOSA  MRSA Next Gen by PCR, Nasal     Status: Abnormal   Collection Time: 05/10/23  1:29 AM   Specimen: Nasal Mucosa; Nasal Swab  Result Value Ref Range Status   MRSA by PCR Next Gen DETECTED (A) NOT DETECTED Final    Comment: (NOTE) The GeneXpert MRSA Assay (FDA approved for NASAL specimens only), is one component of a comprehensive MRSA colonization surveillance program. It is not intended to diagnose MRSA infection nor to guide or monitor treatment for MRSA  infections. Test performance is not FDA approved in patients less than 34 years old. Performed at [facility], [address] [place], [place] [address]     Antimicrobials/Microbiology: Anti-infectives (From admission, onward)    Start     Dose/Rate Route Frequency Ordered Stop   05/11/23 1200  ceFEPIme  (MAXIPIME ) 2 g in sodium chloride  0.9 % 100 mL IVPB  Status:  Discontinued        2 g 200 mL/hr over 30 Minutes Intravenous Every 12 hours 05/11/23 1057 05/12/23 0956   05/10/23 1700  azithromycin  (ZITHROMAX ) 500 mg in sodium chloride  0.9 % 250 mL IVPB        500 mg 250 mL/hr over 60 Minutes Intravenous Every 24 hours 05/09/23 2149 05/11/23 1804   05/10/23 1600  cefTRIAXone  (ROCEPHIN ) 1 g in sodium chloride  0.9 % 100 mL IVPB  Status:  Discontinued        1 g 200 mL/hr over 30 Minutes Intravenous Every 24 hours 05/09/23 2149 05/11/23 1057   05/10/23 1000  remdesivir  100 mg in sodium chloride  0.9 % 100 mL IVPB  Status:  Discontinued       Placed in Followed by Linked Group   100 mg 200 mL/hr over 30 Minutes Intravenous Daily 05/09/23 2158 05/09/23 2202   05/10/23 1000  remdesivir  100 mg in sodium chloride  0.9 % 100 mL IVPB        100 mg 200 mL/hr over 30 Minutes Intravenous Daily 05/09/23 2204 05/11/23 1045   05/09/23 2245  remdesivir  100 mg in sodium chloride  0.9 % 100 mL IVPB       Placed in Followed by Linked Group   100 mg 200 mL/hr over 30 Minutes Intravenous  Once 05/09/23 2200 05/10/23 0304   05/09/23 2200  vancomycin  (VANCOREADY) IVPB 1250 mg/250 mL        750 mg 100 mL/hr over 90 Minutes Intravenous  Once 05/09/23 2149 05/10/23 0303  05/09/23 2200  remdesivir  200 mg in sodium chloride  0.9% 250 mL IVPB  Status:  Discontinued       Placed in Followed by Linked Group   200 mg 580 mL/hr over 30 Minutes Intravenous Once 05/09/23 2158 05/09/23 2202   05/09/23 2200  remdesivir  100 mg in sodium chloride  0.9 % 100 mL IVPB       Placed in Followed by  Linked Group   100 mg 200 mL/hr over 30 Minutes Intravenous  Once 05/09/23 2200 05/10/23 0524   05/09/23 1530  cefTRIAXone  (ROCEPHIN ) 2 g in sodium chloride  0.9 % 100 mL IVPB        2 g 200 mL/hr over 30 Minutes Intravenous Once 05/09/23 1529 05/09/23 1643   05/09/23 1530  azithromycin  (ZITHROMAX ) 500 mg in sodium chloride  0.9 % 250 mL IVPB        500 mg 250 mL/hr over 60 Minutes Intravenous  Once 05/09/23 1529 05/09/23 1750         Component Value Date/Time   SDES  05/09/2023 1613    URINE, RANDOM Performed at [facility], [address], [place], [place] [address]    SPECREQUEST  05/09/2023 1613    NONE Reflexed from [id number] Performed at [facility], [address], [place], [place] [address]    CULT (A) 05/09/2023 1613    >=100,000 COLONIES/mL ESCHERICHIA COLI >=100,000 COLONIES/mL PSEUDOMONAS AERUGINOSA    REPTSTATUS 05/13/2023 FINAL 05/09/2023 1613     Radiology Studies: No results found.   LOS: 7 days   Total time spent in review of labs and imaging, patient evaluation, formulation of plan, documentation and communication with family: 35 minutes  Mennie LAMY, MD [facility]  05/16/2023, 11:32 AM

## 2023-05-16 NOTE — Plan of Care (Signed)
  Problem: Education: Goal: Knowledge of risk factors and measures for prevention of condition will improve Outcome: Progressing   Problem: Coping: Goal: Psychosocial and spiritual needs will be supported Outcome: Progressing   Problem: Respiratory: Goal: Will maintain a patent airway Outcome: Progressing Goal: Complications related to the disease process, condition or treatment will be avoided or minimized Outcome: Progressing   Problem: Education: Goal: Knowledge of General Education information will improve Description: Including pain rating scale, medication(s)/side effects and non-pharmacologic comfort measures Outcome: Progressing   Problem: Health Behavior/Discharge Planning: Goal: Ability to manage health-related needs will improve Outcome: Progressing   Problem: Clinical Measurements: Goal: Ability to maintain clinical measurements within normal limits will improve Outcome: Progressing Goal: Will remain free from infection Outcome: Progressing Goal: Diagnostic test results will improve Outcome: Progressing Goal: Respiratory complications will improve Outcome: Progressing Goal: Cardiovascular complication will be avoided Outcome: Progressing   Problem: Activity: Goal: Risk for activity intolerance will decrease Outcome: Progressing   Problem: Nutrition: Goal: Adequate nutrition will be maintained Outcome: Progressing   Problem: Coping: Goal: Level of anxiety will decrease Outcome: Progressing   Problem: Elimination: Goal: Will not experience complications related to bowel motility Outcome: Progressing Goal: Will not experience complications related to urinary retention Outcome: Progressing   Problem: Pain Management: Goal: General experience of comfort will improve Outcome: Progressing   Problem: Safety: Goal: Ability to remain free from injury will improve Outcome: Progressing   Problem: Skin Integrity: Goal: Risk for impaired skin integrity will  decrease Outcome: Progressing   Problem: Education: Goal: Knowledge of the prescribed therapeutic regimen will improve Outcome: Progressing   Problem: Coping: Goal: Ability to identify and develop effective coping behavior will improve Outcome: Progressing   Problem: Clinical Measurements: Goal: Quality of life will improve Outcome: Progressing   Problem: Respiratory: Goal: Verbalizations of increased ease of respirations will increase Outcome: Progressing   Problem: Role Relationship: Goal: Family's ability to cope with current situation will improve Outcome: Progressing Goal: Ability to verbalize concerns, feelings, and thoughts to partner or family member will improve Outcome: Progressing   Problem: Pain Management: Goal: Satisfaction with pain management regimen will improve Outcome: Progressing

## 2023-05-17 DIAGNOSIS — N171 Acute kidney failure with acute cortical necrosis: Secondary | ICD-10-CM | POA: Diagnosis not present

## 2023-05-17 DIAGNOSIS — R6520 Severe sepsis without septic shock: Secondary | ICD-10-CM | POA: Diagnosis not present

## 2023-05-17 DIAGNOSIS — A419 Sepsis, unspecified organism: Secondary | ICD-10-CM | POA: Diagnosis not present

## 2023-05-17 MED ORDER — ENSURE ENLIVE PO LIQD: 237.0000 mL | Freq: Two times a day (BID) | ORAL | Status: DC

## 2023-05-17 NOTE — Plan of Care (Signed)
  Problem: Education: Goal: Knowledge of risk factors and measures for prevention of condition will improve Outcome: Progressing   Problem: Coping: Goal: Psychosocial and spiritual needs will be supported Outcome: Progressing   Problem: Respiratory: Goal: Will maintain a patent airway Outcome: Progressing Goal: Complications related to the disease process, condition or treatment will be avoided or minimized Outcome: Progressing   Problem: Education: Goal: Knowledge of General Education information will improve Description: Including pain rating scale, medication(s)/side effects and non-pharmacologic comfort measures Outcome: Progressing   Problem: Health Behavior/Discharge Planning: Goal: Ability to manage health-related needs will improve Outcome: Progressing   Problem: Clinical Measurements: Goal: Ability to maintain clinical measurements within normal limits will improve Outcome: Progressing Goal: Will remain free from infection Outcome: Progressing Goal: Diagnostic test results will improve Outcome: Progressing Goal: Respiratory complications will improve Outcome: Progressing Goal: Cardiovascular complication will be avoided Outcome: Progressing   

## 2023-05-17 NOTE — Progress Notes (Signed)
 Pt seems to be resting comfortably my shift.  No signs of pain or distress.

## 2023-05-17 NOTE — TOC Progression Note (Signed)
 Transition of Care (TOC) - Progression Note    Patient Details  Name: James Boccio Sr. MRN: [id number] Date of Birth: Jan 23, 1932  Transition of Care [facility]) CM/SW Contact  Dalila Camellia [place], [place] Phone Number: 05/17/2023, 11:22 AM  Clinical Narrative:     Patient on comfort care, patient being followed by palliative.  [facility] rep Shawn following patient for possible [employer] depending on what family decides.  TOC to continue to follow patient's progress throughout discharge planning.  Expected Discharge Plan: Skilled Nursing Facility Barriers to Discharge: Continued Medical Work up  Expected Discharge Plan and Services     Post Acute Care Choice: Skilled Nursing Facility Living arrangements for the past 2 months: Skilled Nursing Facility                                       Social Determinants of Health (SDOH) Interventions SDOH Screenings   Food Insecurity: Unknown (05/10/2023)  Housing: Low Risk  (12/16/2022)  Transportation Needs: Patient Unable To Answer (05/10/2023)  Utilities: Not At Risk (12/16/2022)  Alcohol  Screen: Low Risk  (07/08/2022)  Depression (PHQ2-9): Low Risk  (07/26/2022)  Financial Resource Strain: Low Risk  (03/30/2022)  Physical Activity: Insufficiently Active (01/29/2021)  Social Connections: Socially Integrated (01/29/2021)  Stress: No Stress Concern Present (01/29/2021)  Tobacco Use: Medium Risk (05/13/2023)    Readmission Risk Interventions    05/15/2023    5:23 PM  Readmission Risk Prevention Plan  Transportation Screening Complete  PCP or Specialist Appt within 3-5 Days Complete  HRI or Home Care Consult Not Complete  HRI or Home Care Consult comments Patient at [employer] under private pay, [facility] not applicable.  Social Work Consult for Recovery Care Planning/Counseling Complete  Palliative Care Screening Complete  Medication Review [employer]) Referral to Pharmacy

## 2023-05-17 NOTE — Progress Notes (Signed)
 PMT follow up.   Received communication from hospice liaison that the patient's daughter wishes to continue GOC discussions regarding in patient hospice transfer.  Call placed and was able to reach daughter.  Daughter updates me that the patient is more awake, he has had some PO intake of ice cream as well.  We reviewed current comfort care measures We talked about his home medication list- has been discontinued off of his PO medications upon transitioning to comfort care - not much advantage to giving MVI, statin, thyroid  medications etc at this stage. Monitor HR and BP to see if BB metoprolol  ought to be added back or not.  We talked about rallying and continuing to monitor patient's condition on current comfort care.  Daughter continues to decide whether residential hospice would be appropriate for patient, appreciate hospice liaison follow up.  No new in patient PMT specific recommendations at this time.  No charge Lonia Serve MD  palliative.

## 2023-05-17 NOTE — Plan of Care (Signed)
  Problem: Education: Goal: Knowledge of risk factors and measures for prevention of condition will improve Outcome: Progressing   Problem: Coping: Goal: Psychosocial and spiritual needs will be supported Outcome: Progressing   Problem: Respiratory: Goal: Will maintain a patent airway Outcome: Progressing Goal: Complications related to the disease process, condition or treatment will be avoided or minimized Outcome: Progressing   Problem: Education: Goal: Knowledge of General Education information will improve Description: Including pain rating scale, medication(s)/side effects and non-pharmacologic comfort measures Outcome: Progressing   Problem: Health Behavior/Discharge Planning: Goal: Ability to manage health-related needs will improve Outcome: Progressing   Problem: Clinical Measurements: Goal: Ability to maintain clinical measurements within normal limits will improve Outcome: Progressing Goal: Will remain free from infection Outcome: Progressing Goal: Diagnostic test results will improve Outcome: Progressing Goal: Respiratory complications will improve Outcome: Progressing Goal: Cardiovascular complication will be avoided Outcome: Progressing   Problem: Activity: Goal: Risk for activity intolerance will decrease Outcome: Progressing   Problem: Nutrition: Goal: Adequate nutrition will be maintained Outcome: Progressing   Problem: Coping: Goal: Level of anxiety will decrease Outcome: Progressing   Problem: Elimination: Goal: Will not experience complications related to bowel motility Outcome: Progressing Goal: Will not experience complications related to urinary retention Outcome: Progressing   Problem: Pain Management: Goal: General experience of comfort will improve Outcome: Progressing   Problem: Safety: Goal: Ability to remain free from injury will improve Outcome: Progressing   Problem: Skin Integrity: Goal: Risk for impaired skin integrity will  decrease Outcome: Progressing   Problem: Education: Goal: Knowledge of the prescribed therapeutic regimen will improve Outcome: Progressing   Problem: Coping: Goal: Ability to identify and develop effective coping behavior will improve Outcome: Progressing   Problem: Clinical Measurements: Goal: Quality of life will improve Outcome: Progressing   Problem: Respiratory: Goal: Verbalizations of increased ease of respirations will increase Outcome: Progressing   Problem: Role Relationship: Goal: Family's ability to cope with current situation will improve Outcome: Progressing Goal: Ability to verbalize concerns, feelings, and thoughts to partner or family member will improve Outcome: Progressing   Problem: Pain Management: Goal: Satisfaction with pain management regimen will improve Outcome: Progressing

## 2023-05-17 NOTE — Progress Notes (Signed)
 [facility] Liaison Note Received request from Camellia, Transitions of Care Manager, for hospice services at [facility], inpatient hospice care, after discharge. Spoke with daughter, [name], to initiate education related to hospice philosophy, services, and team approach to care, including inpatient hospice facility care and expectations. [name] verbalized understanding of information given.   During discussion [name] [place] struggling with decisions related to comfort care. Discussed at length and Dr. Jeryl with PMT notified.   [facility] information and contact numbers given to [place]. Above information shared with Camellia Transitions of Care Manager. Please call with any questions or concerns.  Thank you for the opportunity to participate in this patient's care.   Elouise Husband BSN, RN, OCN [facility] [phone]  Update: after Dr. Jeryl discussed GOC further with patient's daughter, she reports that his daughter is not yet ready for [facility], she knows patient is very ill and this might just be a rally. I re discussed GOC with her, she wants to continue comfort care here for now. Requested that I follow patient tomorrow and speak with [name] again at that time.

## 2023-05-17 NOTE — Progress Notes (Signed)
 Oral care done. Patient tolerated well.  Family at bedside

## 2023-05-17 NOTE — Progress Notes (Signed)
 PROGRESS NOTE James Hernan Sr.  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 Since then he has had progressive decline in functionality. He had been admitted to ICU with COVID and LLL and possibly aspiration pneumonia complicated by sepsis, metabolic encephalopathy requiring low-dose pressors. Palliative care was involved and patient has been transitioned over to comfort care.  Subjective: Overall no change, meeting with hospice today  Not needing morphine  overnight  Assessment and Plan: End-of-life care FTT in adult Severe malnutrition Acute metabolic encephalopathy  Acute hypoxic resp failure 2/2 PNA Septic shock LLL PNA Covid 19 PNA Aspiration PNA hx dysphagia UTI - polymicrobial (e coli, pseudomonas)  pAF CAD s/p LAD stent HTN HLD HFpEF  Hypothyroidism Asthma  Anemia of chronic dz/ Fe def  Urinary retention, chronic foley  Hx mechanical fall Hx rSAH  GERD  Depression/ anxiety  Dementia  DTIs, POA ;  Continue current comfort measures with end-of-life care Meeting with hospice to see patient is a candidate for [facility] Discussed with nursing staff palliative care Per palliative care doctor continue to think about BP versus continuing comfort care at [facility]  DVT prophylaxis: none Code Status:   Code Status: Do not attempt resuscitation (DNR) - Comfort care Family Communication: plan of care discussed with staffs. Patient status is: Remains hospitalized because of severity of illness Level of care: Palliative Care    Dispo: The patient is from: home            Anticipated disposition: Anticipating hospital death  vs [facility]  Objective: Vitals last 24 hrs: Vitals:   05/15/23 2228 05/16/23 0859 05/16/23 2005 05/16/23 2007  BP: (!) 156/111 120/67 (!) 126/112 136/72  Pulse: 80 (!) 46 (!) 105 94  Resp: 17 (!) 24 17   Temp: 98.2 F (36.8 C) 98.4 F (36.9 C) 98.2 F (36.8 C)   TempSrc: Oral Oral Oral   SpO2: 92% (!) 81% 96%   Weight:      Height:       Weight change:   Physical Examination: Today exam deferred to allow for comfort and meeting of CM/palliative care with patient's family    Medications reviewed:  Scheduled Meds:  feeding supplement  237 mL Oral BID BM   Continuous Infusions: Diet Order             Diet regular Room service appropriate? Yes; Fluid consistency: Thin  Diet effective now                   Intake/Output Summary (Last 24 hours) at 05/17/2023 1119 Last data filed at 05/16/2023 2210 Gross per 24 hour  Intake --  Output 250 ml  Net -250 ml    Net IO Since Admission: 6,625.96 mL [05/17/23 1119]  Wt Readings from Last 3 Encounters:  05/11/23 53.9 kg  01/19/23 56.1 kg  12/14/22 58.2 kg     Unresulted Labs (From admission, onward)    None      Data Reviewed: I have personally reviewed following labs and imaging studies CBC: Recent Labs  Lab 05/11/23 0326  WBC 22.5*  HGB 9.0*  HCT 29.7*  MCV 94.0  PLT  205   Basic Metabolic Panel:  Recent Labs  Lab 05/10/23 1537 05/10/23 2132 05/11/23 0326 05/11/23 0952  NA 150* 150* 148* 145  K 3.0* 3.1* 4.0 3.4*  CL 124* 124* 123* 123*  CO2 22 21* 21* 18*  GLUCOSE 147* 122* 145* 155*  BUN 33* 34* 35* 34*  CREATININE 0.97 1.01 0.89 0.86  CALCIUM  7.5* 7.4* 7.5* 7.3*  MG  --   --  2.3  --    GFR: Estimated Creatinine Clearance: 44.4 mL/min (by C-G formula based on SCr of 0.86 mg/dL). Liver Function Tests:  No results for input(s): AST, ALT, ALKPHOS, BILITOT, PROT, ALBUMIN in the  last 168 hours.  No results for input(s): LIPASE, AMYLASE in the last 168 hours.  No results for input(s): AMMONIA in the last 168 hours.  Coagulation Profile: No results for input(s): INR, PROTIME in the last 168 hours. No results for input(s): PROBNP in the last 168 hours.  No results for input(s): HGBA1C in the last 72 hours. Recent Labs  Lab 05/10/23 1712 05/10/23 2019 05/11/23 0005 05/11/23 0312 05/11/23 0836  GLUCAP 135* 142* 132* 142* 136*   No results for input(s): CHOL, HDL, LDLCALC, TRIG, CHOLHDL, LDLDIRECT in the last 72 hours. No results for input(s): TSH, T4TOTAL, FREET4, T3FREE, THYROIDAB in the last 72 hours. Sepsis Labs: Recent Labs  Lab 05/11/23 0326  PROCALCITON 4.02   Recent Results (from the past 240 hours)  Blood Culture (routine x 2)     Status: None   Collection Time: 05/09/23  3:27 PM   Specimen: BLOOD  Result Value Ref Range Status   Specimen Description   Final    BLOOD LEFT ANTECUBITAL Performed at [facility], [address] [place], [place] [address]    Special Requests   Final    BOTTLES DRAWN AEROBIC AND ANAEROBIC Blood Culture adequate volume Performed at [facility], [address], [place], [place] [address]    Culture   Final    NO GROWTH 5 DAYS Performed at [facility], [address] [place], [place] [address]    Report Status 05/14/2023 FINAL  Final  Blood Culture (routine x 2)     Status: None   Collection Time: 05/09/23  3:40 PM   Specimen: BLOOD  Result Value Ref Range Status   Specimen Description   Final    BLOOD RIGHT ANTECUBITAL Performed at [facility], [address] [place], [place] [address]    Special Requests   Final    BOTTLES DRAWN AEROBIC AND ANAEROBIC Blood Culture adequate volume Performed at [facility], [address] [place], [place] [address]    Culture   Final    NO GROWTH 5  DAYS Performed at [facility], [address], [place], [place] [address]    Report Status 05/14/2023 FINAL  Final  Resp panel by RT-PCR (RSV, Flu A&B, Covid) Anterior Nasal Swab     Status: Abnormal   Collection Time: 05/09/23  4:05 PM   Specimen: Anterior Nasal Swab  Result Value Ref Range Status   SARS Coronavirus 2 by RT PCR POSITIVE (A) NEGATIVE Final    Comment: (NOTE) SARS-CoV-2 target nucleic acids are DETECTED.  The SARS-CoV-2 RNA is generally detectable in upper respiratory specimens during the acute phase of infection. Positive results are indicative of the presence of the identified virus, but do not rule out bacterial infection or co-infection with other pathogens not detected  by the test. Clinical correlation with patient history and other diagnostic information is necessary to determine patient infection status. The expected result is Negative.  Fact Sheet for Patients: bloggercourse.com  Fact Sheet for Healthcare Providers: seriousbroker.it  This test is not yet approved or cleared by the [place]  FDA and  has been authorized for detection and/or diagnosis of SARS-CoV-2 by FDA under an Emergency Use Authorization (EUA).  This EUA will remain in effect (meaning this test can be used) for the duration of  the COVID-19 declaration under Section 564(b)(1) of the A ct, 21 U.S.C. section 360bbb-3(b)(1), unless the authorization is terminated or revoked sooner.     Influenza A by PCR NEGATIVE NEGATIVE Final   Influenza B by PCR NEGATIVE NEGATIVE Final    Comment: (NOTE) The Xpert Xpress SARS-CoV-2/FLU/RSV plus assay is intended as an aid in the diagnosis of influenza from Nasopharyngeal swab specimens and should not be used as a sole basis for treatment. Nasal washings and aspirates are unacceptable for Xpert Xpress SARS-CoV-2/FLU/RSV testing.  Fact Sheet for  Patients: bloggercourse.com  Fact Sheet for Healthcare Providers: seriousbroker.it  This test is not yet approved or cleared by the [place]  FDA and has been authorized for detection and/or diagnosis of SARS-CoV-2 by FDA under an Emergency Use Authorization (EUA). This EUA will remain in effect (meaning this test can be used) for the duration of the COVID-19 declaration under Section 564(b)(1) of the Act, 21 U.S.C. section 360bbb-3(b)(1), unless the authorization is terminated or revoked.     Resp Syncytial Virus by PCR NEGATIVE NEGATIVE Final    Comment: (NOTE) Fact Sheet for Patients: bloggercourse.com  Fact Sheet for Healthcare Providers: seriousbroker.it  This test is not yet approved or cleared by the [place]  FDA and has been authorized for detection and/or diagnosis of SARS-CoV-2 by FDA under an Emergency Use Authorization (EUA). This EUA will remain in effect (meaning this test can be used) for the duration of the COVID-19 declaration under Section 564(b)(1) of the Act, 21 U.S.C. section 360bbb-3(b)(1), unless the authorization is terminated or revoked.  Performed at [facility], [address] [place], [place] [address]   Urine Culture     Status: Abnormal   Collection Time: 05/09/23  4:13 PM   Specimen: Urine, Random  Result Value Ref Range Status   Specimen Description   Final    URINE, RANDOM Performed at [facility], [address], [place], [place] [address]    Special Requests   Final    NONE Reflexed from [id number] Performed at [facility], [address], [place], [place] [address]    Culture (A)  Final    >=100,000 COLONIES/mL ESCHERICHIA COLI >=100,000 COLONIES/mL PSEUDOMONAS AERUGINOSA    Report Status 05/13/2023 FINAL  Final   Organism ID, Bacteria PSEUDOMONAS AERUGINOSA (A)  Final    Organism ID, Bacteria ESCHERICHIA COLI (A)  Final      Susceptibility   Escherichia coli - MIC*    AMPICILLIN <=2 SENSITIVE Sensitive     CEFAZOLIN  <=4 SENSITIVE Sensitive     CEFEPIME  <=0.12 SENSITIVE Sensitive     CEFTRIAXONE  <=0.25 SENSITIVE Sensitive     CIPROFLOXACIN  <=0.25 SENSITIVE Sensitive     GENTAMICIN <=1 SENSITIVE Sensitive     IMIPENEM <=0.25 SENSITIVE Sensitive     NITROFURANTOIN  <=16 SENSITIVE Sensitive     TRIMETH /SULFA  <=20 SENSITIVE Sensitive     AMPICILLIN/SULBACTAM <=2 SENSITIVE Sensitive     PIP/TAZO <=4 SENSITIVE Sensitive ug/mL    * >=  100,000 COLONIES/mL ESCHERICHIA COLI   Pseudomonas aeruginosa - MIC*    CEFTAZIDIME 4 SENSITIVE Sensitive     CIPROFLOXACIN  <=0.25 SENSITIVE Sensitive     GENTAMICIN <=1 SENSITIVE Sensitive     IMIPENEM 2 SENSITIVE Sensitive     PIP/TAZO 16 SENSITIVE Sensitive ug/mL    CEFEPIME  2 SENSITIVE Sensitive     * >=100,000 COLONIES/mL PSEUDOMONAS AERUGINOSA  MRSA Next Gen by PCR, Nasal     Status: Abnormal   Collection Time: 05/10/23  1:29 AM   Specimen: Nasal Mucosa; Nasal Swab  Result Value Ref Range Status   MRSA by PCR Next Gen DETECTED (A) NOT DETECTED Final    Comment: (NOTE) The GeneXpert MRSA Assay (FDA approved for NASAL specimens only), is one component of a comprehensive MRSA colonization surveillance program. It is not intended to diagnose MRSA infection nor to guide or monitor treatment for MRSA infections. Test performance is not FDA approved in patients less than 86 years old. Performed at [facility], [address], [place], [place] [address]     Antimicrobials/Microbiology: Anti-infectives (From admission, onward)    Start     Dose/Rate Route Frequency Ordered Stop   05/11/23 1200  ceFEPIme  (MAXIPIME ) 2 g in sodium chloride  0.9 % 100 mL IVPB  Status:  Discontinued        2 g 200 mL/hr over 30 Minutes Intravenous Every 12 hours 05/11/23 1057 05/12/23 0956   05/10/23 1700  azithromycin   (ZITHROMAX ) 500 mg in sodium chloride  0.9 % 250 mL IVPB        500 mg 250 mL/hr over 60 Minutes Intravenous Every 24 hours 05/09/23 2149 05/11/23 1804   05/10/23 1600  cefTRIAXone  (ROCEPHIN ) 1 g in sodium chloride  0.9 % 100 mL IVPB  Status:  Discontinued        1 g 200 mL/hr over 30 Minutes Intravenous Every 24 hours 05/09/23 2149 05/11/23 1057   05/10/23 1000  remdesivir  100 mg in sodium chloride  0.9 % 100 mL IVPB  Status:  Discontinued       Placed in Followed by Linked Group   100 mg 200 mL/hr over 30 Minutes Intravenous Daily 05/09/23 2158 05/09/23 2202   05/10/23 1000  remdesivir  100 mg in sodium chloride  0.9 % 100 mL IVPB        100 mg 200 mL/hr over 30 Minutes Intravenous Daily 05/09/23 2204 05/11/23 1045   05/09/23 2245  remdesivir  100 mg in sodium chloride  0.9 % 100 mL IVPB       Placed in Followed by Linked Group   100 mg 200 mL/hr over 30 Minutes Intravenous  Once 05/09/23 2200 05/10/23 0304   05/09/23 2200  vancomycin  (VANCOREADY) IVPB 1250 mg/250 mL        750 mg 100 mL/hr over 90 Minutes Intravenous  Once 05/09/23 2149 05/10/23 0303   05/09/23 2200  remdesivir  200 mg in sodium chloride  0.9% 250 mL IVPB  Status:  Discontinued       Placed in Followed by Linked Group   200 mg 580 mL/hr over 30 Minutes Intravenous Once 05/09/23 2158 05/09/23 2202   05/09/23 2200  remdesivir  100 mg in sodium chloride  0.9 % 100 mL IVPB       Placed in Followed by Linked Group   100 mg 200 mL/hr over 30 Minutes Intravenous  Once 05/09/23 2200 05/10/23 0524   05/09/23 1530  cefTRIAXone  (ROCEPHIN ) 2 g in sodium chloride  0.9 % 100 mL IVPB  2 g 200 mL/hr over 30 Minutes Intravenous Once 05/09/23 1529 05/09/23 1643   05/09/23 1530  azithromycin  (ZITHROMAX ) 500 mg in sodium chloride  0.9 % 250 mL IVPB        500 mg 250 mL/hr over 60 Minutes Intravenous  Once 05/09/23 1529 05/09/23 1750         Component Value Date/Time   SDES  05/09/2023 1613    URINE, RANDOM Performed at  [facility], [address], [place], [place] [address]    SPECREQUEST  05/09/2023 1613    NONE Reflexed from [id number] Performed at [facility], [address] [place], [place] [address]    CULT (A) 05/09/2023 1613    >=100,000 COLONIES/mL ESCHERICHIA COLI >=100,000 COLONIES/mL PSEUDOMONAS AERUGINOSA    REPTSTATUS 05/13/2023 FINAL 05/09/2023 1613     Radiology Studies: No results found.   LOS: 8 days   Total time spent in review of labs and imaging, patient evaluation, formulation of plan, documentation and communication with family: 35 minutes  Mennie LAMY, MD [facility]  05/17/2023, 11:19 AM

## 2023-05-18 DIAGNOSIS — A419 Sepsis, unspecified organism: Secondary | ICD-10-CM | POA: Diagnosis not present

## 2023-05-18 DIAGNOSIS — Z515 Encounter for palliative care: Secondary | ICD-10-CM | POA: Diagnosis not present

## 2023-05-18 DIAGNOSIS — Z7189 Other specified counseling: Secondary | ICD-10-CM | POA: Diagnosis not present

## 2023-05-18 DIAGNOSIS — R6520 Severe sepsis without septic shock: Secondary | ICD-10-CM | POA: Diagnosis not present

## 2023-05-18 DIAGNOSIS — E43 Unspecified severe protein-calorie malnutrition: Secondary | ICD-10-CM | POA: Diagnosis not present

## 2023-05-18 DIAGNOSIS — N171 Acute kidney failure with acute cortical necrosis: Secondary | ICD-10-CM | POA: Diagnosis not present

## 2023-05-18 MED ORDER — MORPHINE SULFATE (CONCENTRATE) 10 MG /0.5 ML PO SOLN: 5.0000 mg | ORAL | Status: DC | PRN

## 2023-05-18 MED ORDER — HALOPERIDOL 0.5 MG PO TABS: 0.5000 mg | ORAL_TABLET | ORAL | Status: DC | PRN

## 2023-05-18 MED ORDER — GLYCOPYRROLATE 0.2 MG/ML IJ SOLN: 0.2000 mg | INTRAMUSCULAR | Status: DC | PRN

## 2023-05-18 NOTE — Progress Notes (Signed)
 PROGRESS NOTE James Vinal Sr.  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 Since then he has had progressive decline in functionality. He had been admitted to ICU with COVID and LLL and possibly aspiration pneumonia complicated by sepsis, metabolic encephalopathy requiring low-dose pressors. Palliative care was involved and patient has been transitioned over to comfort care.  Subjective: Seen and examined, He is more alert awake today nods for response, when asked if he is in pain he replies no Minimal movement of his extremities  Assessment and Plan: End-of-life care FTT in adult Severe malnutrition Acute metabolic encephalopathy  Acute hypoxic resp failure 2/2 PNA Septic shock LLL PNA Covid 19 PNA Aspiration PNA hx dysphagia UTI - polymicrobial (e coli, pseudomonas)  pAF CAD s/p LAD stent HTN HLD HFpEF  Hypothyroidism Asthma  Anemia of chronic dz/ Fe def  Urinary retention, chronic foley  Hx mechanical fall Hx rSAH  GERD  Depression/ anxiety  Dementia  DTIs, POA ;  Continue current comfort measures with end-of-life care Palliative care continues to follow and engage in the discussion for [facility], ? If can return to facility with hospice No family at bedside this morning  DVT prophylaxis: none Code Status:   Code Status: Do not attempt resuscitation (DNR) - Comfort care Family Communication: plan of care discussed with staffs. Patient status is: Remains hospitalized because of severity of illness Level of care:  Palliative Care   Dispo: The patient is from: home            Anticipated disposition: cont hospice  Objective: Vitals last 24 hrs: Vitals:   05/16/23 0859 05/16/23 2005 05/16/23 2007 05/17/23 1245  BP: 120/67 (!) 126/112 136/72 117/65  Pulse: (!) 46 (!) 105 94 94  Resp: (!) 24 17  16   Temp: 98.4 F (36.9 C) 98.2 F (36.8 C)  98.2 F (36.8 C)  TempSrc: Oral Oral    SpO2: (!) 81% 96%  95%  Weight:      Height:       Weight change:   Physical Examination: General exam: alert awake, nods for response  HEENT:Oral mucosa moist, Ear/Nose WNL grossly Respiratory system: Bilaterally clear BS,no use of accessory muscle Cardiovascular system: S1 & S2 +, No JVD. Gastrointestinal system: Abdomen soft,NT,ND, BS+ Nervous System: Alert, awake, moving left arm, unable to move lower extremities and right upper extremity 87  Skin: No rashes,no icterus. MSK: Thin muscle bulk and low-power  Medications reviewed:  Scheduled Meds:  feeding supplement  237 mL Oral BID BM   Continuous Infusions: Diet Order             Diet regular Room service appropriate? Yes; Fluid consistency: Thin  Diet effective now                  No intake or output data in the 24 hours ending 05/18/23 1019   Net IO Since Admission: 6,625.96 mL [05/18/23 1019]  Wt Readings from Last 3 Encounters:  05/11/23 53.9 kg  01/19/23 56.1 kg  12/14/22 58.2 kg     Unresulted Labs (From admission, onward)    None      Data  Reviewed: I have personally reviewed following labs and imaging studies CBC: No results for input(s): WBC, NEUTROABS, HGB, HCT, MCV, PLT in the last 168 hours.  Basic Metabolic Panel:  No results for input(s): NA, K, CL, CO2, GLUCOSE, BUN, CREATININE, CALCIUM , MG, PHOS in the last 168 hours.  GFR: Estimated Creatinine Clearance: 44.4 mL/min (by C-G formula based on SCr of 0.86 mg/dL).  Recent Results (from the past 240 hours)  Blood Culture (routine x 2)      Status: None   Collection Time: 05/09/23  3:27 PM   Specimen: BLOOD  Result Value Ref Range Status   Specimen Description   Final    BLOOD LEFT ANTECUBITAL Performed at [facility], [address], [place], [place] [address]    Special Requests   Final    BOTTLES DRAWN AEROBIC AND ANAEROBIC Blood Culture adequate volume Performed at [facility], [address], [place], [place] [address]    Culture   Final    NO GROWTH 5 DAYS Performed at [facility], [address] [place], [place] [address]    Report Status 05/14/2023 FINAL  Final  Blood Culture (routine x 2)     Status: None   Collection Time: 05/09/23  3:40 PM   Specimen: BLOOD  Result Value Ref Range Status   Specimen Description   Final    BLOOD RIGHT ANTECUBITAL Performed at [facility], [address] [place], [place] [address]    Special Requests   Final    BOTTLES DRAWN AEROBIC AND ANAEROBIC Blood Culture adequate volume Performed at [facility], [address] [place], [place] [address]    Culture   Final    NO GROWTH 5 DAYS Performed at [facility], [address] [place], [place] [address]    Report Status 05/14/2023 FINAL  Final  Resp panel by RT-PCR (RSV, Flu A&B, Covid) Anterior Nasal Swab     Status: Abnormal   Collection Time: 05/09/23  4:05 PM   Specimen: Anterior Nasal Swab  Result Value Ref Range Status   SARS Coronavirus 2 by RT PCR POSITIVE (A) NEGATIVE Final    Comment: (NOTE) SARS-CoV-2 target nucleic acids are DETECTED.  The SARS-CoV-2 RNA is generally detectable in upper respiratory specimens during the acute phase of infection. Positive results are indicative of the presence of the identified virus, but do not rule out bacterial infection or co-infection with other pathogens not detected by the test. Clinical correlation with patient history and other diagnostic information is necessary to  determine patient infection status. The expected result is Negative.  Fact Sheet for Patients: bloggercourse.com  Fact Sheet for Healthcare Providers: seriousbroker.it  This test is not yet approved or cleared by the [place]  FDA and  has been authorized for detection and/or diagnosis of SARS-CoV-2 by FDA under an Emergency Use Authorization (EUA).  This EUA will remain in effect (meaning this test can be used) for the duration of  the COVID-19 declaration under Section 564(b)(1) of the A ct, 21 U.S.C. section 360bbb-3(b)(1), unless the authorization is terminated or revoked sooner.     Influenza A by PCR NEGATIVE NEGATIVE Final   Influenza B by PCR NEGATIVE NEGATIVE Final    Comment: (NOTE) The Xpert Xpress SARS-CoV-2/FLU/RSV plus assay is intended as an aid in the diagnosis of influenza from Nasopharyngeal swab specimens and should not be used as a sole basis for treatment. Nasal washings and aspirates are  unacceptable for Xpert Xpress SARS-CoV-2/FLU/RSV testing.  Fact Sheet for Patients: bloggercourse.com  Fact Sheet for Healthcare Providers: seriousbroker.it  This test is not yet approved or cleared by the [place]  FDA and has been authorized for detection and/or diagnosis of SARS-CoV-2 by FDA under an Emergency Use Authorization (EUA). This EUA will remain in effect (meaning this test can be used) for the duration of the COVID-19 declaration under Section 564(b)(1) of the Act, 21 U.S.C. section 360bbb-3(b)(1), unless the authorization is terminated or revoked.     Resp Syncytial Virus by PCR NEGATIVE NEGATIVE Final    Comment: (NOTE) Fact Sheet for Patients: bloggercourse.com  Fact Sheet for Healthcare Providers: seriousbroker.it  This test is not yet approved or cleared by the [place]  FDA and has  been authorized for detection and/or diagnosis of SARS-CoV-2 by FDA under an Emergency Use Authorization (EUA). This EUA will remain in effect (meaning this test can be used) for the duration of the COVID-19 declaration under Section 564(b)(1) of the Act, 21 U.S.C. section 360bbb-3(b)(1), unless the authorization is terminated or revoked.  Performed at [facility], [address], [place], [place] [address]   Urine Culture     Status: Abnormal   Collection Time: 05/09/23  4:13 PM   Specimen: Urine, Random  Result Value Ref Range Status   Specimen Description   Final    URINE, RANDOM Performed at [facility], [address], [place], [place] [address]    Special Requests   Final    NONE Reflexed from [id number] Performed at [facility], [address], [place], [place] [address]    Culture (A)  Final    >=100,000 COLONIES/mL ESCHERICHIA COLI >=100,000 COLONIES/mL PSEUDOMONAS AERUGINOSA    Report Status 05/13/2023 FINAL  Final   Organism ID, Bacteria PSEUDOMONAS AERUGINOSA (A)  Final   Organism ID, Bacteria ESCHERICHIA COLI (A)  Final      Susceptibility   Escherichia coli - MIC*    AMPICILLIN <=2 SENSITIVE Sensitive     CEFAZOLIN  <=4 SENSITIVE Sensitive     CEFEPIME  <=0.12 SENSITIVE Sensitive     CEFTRIAXONE  <=0.25 SENSITIVE Sensitive     CIPROFLOXACIN  <=0.25 SENSITIVE Sensitive     GENTAMICIN <=1 SENSITIVE Sensitive     IMIPENEM <=0.25 SENSITIVE Sensitive     NITROFURANTOIN  <=16 SENSITIVE Sensitive     TRIMETH /SULFA  <=20 SENSITIVE Sensitive     AMPICILLIN/SULBACTAM <=2 SENSITIVE Sensitive     PIP/TAZO <=4 SENSITIVE Sensitive ug/mL    * >=100,000 COLONIES/mL ESCHERICHIA COLI   Pseudomonas aeruginosa - MIC*    CEFTAZIDIME 4 SENSITIVE Sensitive     CIPROFLOXACIN  <=0.25 SENSITIVE Sensitive     GENTAMICIN <=1 SENSITIVE Sensitive     IMIPENEM 2 SENSITIVE Sensitive     PIP/TAZO 16 SENSITIVE Sensitive ug/mL    CEFEPIME  2  SENSITIVE Sensitive     * >=100,000 COLONIES/mL PSEUDOMONAS AERUGINOSA  MRSA Next Gen by PCR, Nasal     Status: Abnormal   Collection Time: 05/10/23  1:29 AM   Specimen: Nasal Mucosa; Nasal Swab  Result Value Ref Range Status   MRSA by PCR Next Gen DETECTED (A) NOT DETECTED Final    Comment: (NOTE) The GeneXpert MRSA Assay (FDA approved for NASAL specimens only), is one component of a comprehensive MRSA colonization surveillance program. It is not intended to diagnose MRSA infection nor to guide or monitor treatment for MRSA infections. Test performance is not FDA approved in patients less than 28 years old.  Performed at [facility], [address] [place], [place] [address]     Antimicrobials/Microbiology: Anti-infectives (From admission, onward)    Start     Dose/Rate Route Frequency Ordered Stop   05/11/23 1200  ceFEPIme  (MAXIPIME ) 2 g in sodium chloride  0.9 % 100 mL IVPB  Status:  Discontinued        2 g 200 mL/hr over 30 Minutes Intravenous Every 12 hours 05/11/23 1057 05/12/23 0956   05/10/23 1700  azithromycin  (ZITHROMAX ) 500 mg in sodium chloride  0.9 % 250 mL IVPB        500 mg 250 mL/hr over 60 Minutes Intravenous Every 24 hours 05/09/23 2149 05/11/23 1804   05/10/23 1600  cefTRIAXone  (ROCEPHIN ) 1 g in sodium chloride  0.9 % 100 mL IVPB  Status:  Discontinued        1 g 200 mL/hr over 30 Minutes Intravenous Every 24 hours 05/09/23 2149 05/11/23 1057   05/10/23 1000  remdesivir  100 mg in sodium chloride  0.9 % 100 mL IVPB  Status:  Discontinued       Placed in Followed by Linked Group   100 mg 200 mL/hr over 30 Minutes Intravenous Daily 05/09/23 2158 05/09/23 2202   05/10/23 1000  remdesivir  100 mg in sodium chloride  0.9 % 100 mL IVPB        100 mg 200 mL/hr over 30 Minutes Intravenous Daily 05/09/23 2204 05/11/23 1045   05/09/23 2245  remdesivir  100 mg in sodium chloride  0.9 % 100 mL IVPB       Placed in Followed by Linked Group   100 mg 200  mL/hr over 30 Minutes Intravenous  Once 05/09/23 2200 05/10/23 0304   05/09/23 2200  vancomycin  (VANCOREADY) IVPB 1250 mg/250 mL        750 mg 100 mL/hr over 90 Minutes Intravenous  Once 05/09/23 2149 05/10/23 0303   05/09/23 2200  remdesivir  200 mg in sodium chloride  0.9% 250 mL IVPB  Status:  Discontinued       Placed in Followed by Linked Group   200 mg 580 mL/hr over 30 Minutes Intravenous Once 05/09/23 2158 05/09/23 2202   05/09/23 2200  remdesivir  100 mg in sodium chloride  0.9 % 100 mL IVPB       Placed in Followed by Linked Group   100 mg 200 mL/hr over 30 Minutes Intravenous  Once 05/09/23 2200 05/10/23 0524   05/09/23 1530  cefTRIAXone  (ROCEPHIN ) 2 g in sodium chloride  0.9 % 100 mL IVPB        2 g 200 mL/hr over 30 Minutes Intravenous Once 05/09/23 1529 05/09/23 1643   05/09/23 1530  azithromycin  (ZITHROMAX ) 500 mg in sodium chloride  0.9 % 250 mL IVPB        500 mg 250 mL/hr over 60 Minutes Intravenous  Once 05/09/23 1529 05/09/23 1750         Component Value Date/Time   SDES  05/09/2023 1613    URINE, RANDOM Performed at [facility], [address] [place], [place] [address]    SPECREQUEST  05/09/2023 1613    NONE Reflexed from [id number] Performed at [facility], [address], [place], [place] [address]    CULT (A) 05/09/2023 1613    >=100,000 COLONIES/mL ESCHERICHIA COLI >=100,000 COLONIES/mL PSEUDOMONAS AERUGINOSA    REPTSTATUS 05/13/2023 FINAL 05/09/2023 1613     Radiology Studies: No results found.   LOS: 9 days   Total time spent in review of labs and imaging, patient evaluation,  formulation of plan, documentation and communication with family: 35 minutes  Mennie LAMY, MD [facility]  05/18/2023, 10:19 AM

## 2023-05-18 NOTE — Progress Notes (Signed)
 [facility] Liaison Note  Visited patient and daughter. Patient appears restless with signs of dyspnea. Daughter reports that prns have been given for discomfort this morning. She also reports that she would like to have patient discharged to [facility] if approved.   Patient is approved for GIP LOC at [facility] by Dr. Ephriam Showman, hospice provider. Patient should discharge to [facility] later today after consents are complete.   TOC is aware of this information and plan.    Elouise Husband BSN, CHARITY FUNDRAISER, OCN [employer] [phone]

## 2023-05-18 NOTE — TOC Progression Note (Addendum)
 Transition of Care (TOC) - Progression Note    Patient Details  Name: James Szymborski Sr. MRN: [id number] Date of Birth: [birth year]  Transition of Care [facility]) CM/SW Contact  Sonda Manuella Quill, RN Phone Number: 05/18/2023, 12:44 PM  Clinical Narrative:    Elouise Husband, [facility] Liaison notified pt's dtr requests d/c to [facility].  -1319- notified by Elouise Husband that pt has been approved for [facility]; he will also notify this RN,CM when transport can be called.  -1356- spoke w/ pt's dtr [name] Sharps; she agrees to d/c plan for [facility]; she was also informed pt transport by [facility].  Expected Discharge Plan: Skilled Nursing Facility Barriers to Discharge: Continued Medical Work up  Expected Discharge Plan and Services     Post Acute Care Choice: Skilled Nursing Facility Living arrangements for the past 2 months: Skilled Nursing Facility                                       Social Determinants of Health (SDOH) Interventions SDOH Screenings   Food Insecurity: Unknown (05/10/2023)  Housing: Low Risk  (12/16/2022)  Transportation Needs: Patient Unable To Answer (05/10/2023)  Utilities: Not At Risk (12/16/2022)  Alcohol  Screen: Low Risk  (07/08/2022)  Depression (PHQ2-9): Low Risk  (07/26/2022)  Financial Resource Strain: Low Risk  (03/30/2022)  Physical Activity: Insufficiently Active (01/29/2021)  Social Connections: Socially Integrated (01/29/2021)  Stress: No Stress Concern Present (01/29/2021)  Tobacco Use: Medium Risk (05/13/2023)    Readmission Risk Interventions    05/15/2023    5:23 PM  Readmission Risk Prevention Plan  Transportation Screening Complete  PCP or Specialist Appt within 3-5 Days Complete  HRI or Home Care Consult Not Complete  HRI or Home Care Consult comments Patient at [employer] under private pay, [facility] not applicable.  Social Work Consult for Recovery Care Planning/Counseling Complete  Palliative Care Screening Complete   Medication Review [employer]) Referral to Pharmacy

## 2023-05-18 NOTE — Progress Notes (Signed)
 Daily Progress Note   Patient Name: James Thrall Sr.       Date: 05/18/2023 DOB: Apr 12, 1932  Age: 89 y.o. MRN#: [id number] Attending Physician: Christobal Guadalajara, MD Primary Care Physician: Sherre Clapper, MD Admit Date: 05/09/2023 Length of Stay: 9 days  Reason for Consultation/Follow-up: Establishing goals of care  Subjective:   CC: Patient laying in bed resting comfortably.   Following up regarding complex medical decision making.   Subjective:  Reviewed EMR prior to presenting to bedside.  Continues on morphine  oral medications since patient lost IV access and this could not be reobtained.   Objective:   Vital Signs:  BP 117/65 (BP Location: Right Arm)   Pulse 94   Temp 98.2 F (36.8 C)   Resp 16   Ht 6' (1.829 m)   Wt 53.9 kg   SpO2 95%   BMI 16.12 kg/m   Physical Exam: General: sleeping, cachectic, frail, ill-appearing Cardiovascular: RRR Respiratory: Mouth open, shallow breath sounds Skin: Multiple ecchymoses present on upper and lower extremities b/l  Imaging: I personally reviewed recent imaging.   Assessment & Plan:   Assessment:   Patient is an 89 year old male with a past medical history of subarachnoid hemorrhage (01/2023), frequent falls, paroxysmal A-fib, CAD status post LAD 2022, hypertension, hypothyroidism, mixed conductive and sensorineural hearing loss, asthma, anemia of chronic disease, urinary retention, dyslipidemia, GERD, depression/anxiety, and severe protein calorie malnutrition who was admitted on 05/09/2023 from SNF for management of hypoxia of 88% on room air, generalized weakness, altered mental status, and fever.  During hospitalization patient has required admission to the ICU for management of septic shock secondary to left lower lobe pneumonia in setting of COVID-positive and possible aspiration pneumonia with known history of dysphagia.  Patient also receiving management for urinary tract infection, AKI, urinary retention, and DTI on  heels/sacrum/bilateral hips that were reported to be present on admission.  Palliative medicine team consulted to assist with complex medical decision making.  Recommendations/Plan: # Complex medical decision making/goals of care:                -Patient transitioned to full comfort focused care on 05/12/23; To be continued in hospital.  Will not perform further aggressive medical interventions and instead focus on patient's symptom management.  Patient with minimal to nail oral intake for the last 24 to 48 hours, aspiration risk.    Continuing comfort focused care at this time.                Code Status: Do not attempt resuscitation (DNR) - Comfort care   # Symptoms Management  -Continue medications aimed at patient's comfort such as morphine  for pain/shortness of breath, Ativan  for agitation, and Robinul  for secretions.  Has oral medications at this time due to loss of IV access.     # Psycho-social/Spiritual Support:  - Support System: Daughter, son-in-law, wife (multiple medical conditions including stroke preventing engagement in complex medical decision making), sister   # Discharge Planning: Continuing comfort focused care at this time with   Consider inpatient hospice - residential hospice evaluation, will request TOC.  Discussed with:   patient's daughter, hospitalist   Thank you for allowing the palliative care team to participate in the care [facility] GomezSABRA Ngo MDM Lonia Serve MD Palliative Care Provider PMT # [phone]  If patient remains symptomatic despite maximum doses, please call PMT at [phone] between 0700 and 1900. Outside of these hours, please call attending, as PMT does not have  night coverage.  *Please note that this is a verbal dictation therefore any spelling or grammatical errors are due to the Dragon Medical One system interpretation.

## 2023-05-18 NOTE — Discharge Summary (Signed)
 Physician Discharge Summary  James Boorman Sr. FMW:[id number] DOB: 1931/05/11 DOA: 05/09/2023  PCP: Sherre Clapper, MD  Admit date: 05/09/2023 Discharge date: 05/18/2023 Recommendations for Outpatient Follow-up:  Follow up  at [facility]  Discharge Dispo: [facility] Discharge Condition: Stable Code Status:   Code Status: Do not attempt resuscitation (DNR) - Comfort care Diet recommendation:  Diet Order             Diet regular Room service appropriate? 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 Since then he has had progressive decline in functionality. He had been admitted to ICU with COVID and LLL and possibly aspiration pneumonia complicated by sepsis, metabolic encephalopathy requiring low-dose pressors. Palliative care was involved and patient has been transitioned over to comfort care. [facility] has been approved and daughter agreeable with the plan.  Discharge Diagnoses:  Principal Problem:   Sepsis (HCC) Active Problems:   PAF (paroxysmal atrial fibrillation) (HCC)   Ischemic cardiomyopathy   Acquired hypothyroidism   Asthma-COPD overlap syndrome (HCC)   General weakness   COVID-19 virus infection   Elevated lactic acid level   Need for emotional support   DNR (do not resuscitate)   Goals of care, counseling/discussion   Counseling and coordination of care   Sepsis due to urinary tract infection (HCC)   Septic shock (HCC)   Severe protein-calorie malnutrition (HCC)   Palliative care encounter   FTT (failure to thrive) in  adult   Aspiration pneumonia (HCC)   Palliative care by specialist   Pressure injury of skin   Dehydration   Medication management  End-of-life care FTT in adult Severe malnutrition Acute metabolic encephalopathy  Acute hypoxic resp failure 2/2 PNA Septic shock LLL PNA Covid 19 PNA Aspiration PNA hx dysphagia UTI - polymicrobial (e coli, pseudomonas)  pAF CAD s/p LAD stent HTN HLD HFpEF  Hypothyroidism Asthma  Anemia of chronic dz/ Fe def  Urinary retention, chronic foley  Hx mechanical fall Hx rSAH  GERD  Depression/ anxiety  Dementia  DTIs, POA ;   Continue current comfort measures with end-of-life care at [facility]  Consults: PCCM PMT Subjective: More alert awake.  Discharge Exam: Vitals:   05/17/23 1245 05/18/23 1302  BP: 117/65 113/75  Pulse: 94 79  Resp: 16 (!) 24  Temp: 98.2 F (36.8 C) 97.6 F (36.4 C)  SpO2: 95% 95%   General: Pt is alert, awake, not in acute distress Cardiovascular: RRR, S1/S2 +, no rubs, no gallops Respiratory: CTA bilaterally, no wheezing, no rhonchi Abdominal: Soft, NT, ND, bowel sounds + Extremities: no edema, no cyanosis  Discharge Instructions  Discharge Instructions     No wound care   Complete by: As directed       Allergies as of 05/18/2023       Reactions   Clarithromycin Other (See Comments)   GI upset- Allergic, per [facility]        Medication List     STOP taking these medications    acetaminophen  325 MG tablet Commonly known as: TYLENOL    albuterol   108 (90 Base) MCG/ACT inhaler Commonly known as: VENTOLIN  HFA   Alcaftadine 0.25 % Soln   amiodarone  200 MG tablet Commonly known as: PACERONE    atomoxetine  40 MG capsule Commonly known as: STRATTERA    atorvastatin  40 MG tablet Commonly known as: LIPITOR    atorvastatin  80 MG tablet Commonly known as: LIPITOR    bisacodyl  10 MG suppository Commonly known as: DULCOLAX   Enema Disposable Enem   famotidine  40 MG tablet Commonly known  as: PEPCID    famotidine  40 MG/5ML suspension Commonly known as: PEPCID    feeding supplement (OSMOLITE 1.5 CAL) Liqd   feeding supplement (PROSource TF20) liquid   ferrous sulfate  325 (65 FE) MG tablet Commonly known as: FerrouSul   fexofenadine 60 MG tablet Commonly known as: ALLEGRA   free water  Soln   ipratropium-albuterol  0.5-2.5 (3) MG/3ML Soln Commonly known as: DUONEB   levothyroxine  175 MCG tablet Commonly known as: SYNTHROID    magnesium  hydroxide 400 MG/5ML suspension Commonly known as: MILK OF MAGNESIA   metoprolol  tartrate 25 MG tablet Commonly known as: LOPRESSOR    multivitamin tablet   NON FORMULARY   tamsulosin 0.4 MG Caps capsule Commonly known as: FLOMAX   thiamine  100 MG tablet Commonly known as: Vitamin B-1       TAKE these medications    glycopyrrolate  0.2 MG/ML injection Commonly known as: ROBINUL  Inject 1 mL (0.2 mg total) into the vein every 4 (four) hours as needed (excessive secretions).   haloperidol  0.5 MG tablet Commonly known as: HALDOL  Take 1 tablet (0.5 mg total) by mouth every 4 (four) hours as needed for agitation (or delirium).   morphine  CONCENTRATE 10 mg / 0.5 ml concentrated solution Take 0.25 mLs (5 mg total) by mouth every 2 (two) hours as needed for moderate pain (pain score 4-6), severe pain (pain score 7-10) or shortness of breath.        Allergies  Allergen Reactions   Clarithromycin Other (See Comments)    GI upset- Allergic, per [facility]    The results of significant diagnostics from this hospitalization (including imaging, microbiology, ancillary and laboratory) are listed below for reference.    Microbiology: Recent Results (from the past 240 hours)  Blood Culture (routine x 2)     Status: None   Collection Time: 05/09/23  3:27 PM   Specimen: BLOOD  Result Value Ref Range Status   Specimen Description   Final    BLOOD LEFT ANTECUBITAL Performed at [facility], [address],  [place], [place] [address]    Special Requests   Final    BOTTLES DRAWN AEROBIC AND ANAEROBIC Blood Culture adequate volume Performed at [facility], [address], [place], [place] [address]    Culture   Final    NO GROWTH 5 DAYS Performed at [facility], [address], [place], [place] [address]    Report Status 05/14/2023 FINAL  Final  Blood Culture (routine x 2)     Status: None   Collection Time: 05/09/23  3:40 PM   Specimen: BLOOD  Result Value Ref Range Status   Specimen Description   Final    BLOOD RIGHT ANTECUBITAL Performed at [facility], [address], [place], [place] [address]    Special Requests   Final    BOTTLES DRAWN AEROBIC AND ANAEROBIC Blood Culture adequate volume Performed at [facility], [address], [place], [place] [address]    Culture   Final    NO GROWTH  5 DAYS Performed at [facility], [address], [place], [place] [address]    Report Status 05/14/2023 FINAL  Final  Resp panel by RT-PCR (RSV, Flu A&B, Covid) Anterior Nasal Swab     Status: Abnormal   Collection Time: 05/09/23  4:05 PM   Specimen: Anterior Nasal Swab  Result Value Ref Range Status   SARS Coronavirus 2 by RT PCR POSITIVE (A) NEGATIVE Final    Comment: (NOTE) SARS-CoV-2 target nucleic acids are DETECTED.  The SARS-CoV-2 RNA is generally detectable in upper respiratory specimens during the acute phase of infection. Positive results are indicative of the presence of the identified virus, but do not rule out bacterial infection or co-infection with other pathogens not detected by the test. Clinical correlation with patient history and other diagnostic information is necessary to determine patient infection status. The expected result is Negative.  Fact Sheet for Patients: bloggercourse.com  Fact Sheet for Healthcare Providers: seriousbroker.it  This test  is not yet approved or cleared by the [place]  FDA and  has been authorized for detection and/or diagnosis of SARS-CoV-2 by FDA under an Emergency Use Authorization (EUA).  This EUA will remain in effect (meaning this test can be used) for the duration of  the COVID-19 declaration under Section 564(b)(1) of the A ct, 21 U.S.C. section 360bbb-3(b)(1), unless the authorization is terminated or revoked sooner.     Influenza A by PCR NEGATIVE NEGATIVE Final   Influenza B by PCR NEGATIVE NEGATIVE Final    Comment: (NOTE) The Xpert Xpress SARS-CoV-2/FLU/RSV plus assay is intended as an aid in the diagnosis of influenza from Nasopharyngeal swab specimens and should not be used as a sole basis for treatment. Nasal washings and aspirates are unacceptable for Xpert Xpress SARS-CoV-2/FLU/RSV testing.  Fact Sheet for Patients: bloggercourse.com  Fact Sheet for Healthcare Providers: seriousbroker.it  This test is not yet approved or cleared by the [place]  FDA and has been authorized for detection and/or diagnosis of SARS-CoV-2 by FDA under an Emergency Use Authorization (EUA). This EUA will remain in effect (meaning this test can be used) for the duration of the COVID-19 declaration under Section 564(b)(1) of the Act, 21 U.S.C. section 360bbb-3(b)(1), unless the authorization is terminated or revoked.     Resp Syncytial Virus by PCR NEGATIVE NEGATIVE Final    Comment: (NOTE) Fact Sheet for Patients: bloggercourse.com  Fact Sheet for Healthcare Providers: seriousbroker.it  This test is not yet approved or cleared by the [place]  FDA and has been authorized for detection and/or diagnosis of SARS-CoV-2 by FDA under an Emergency Use Authorization (EUA). This EUA will remain in effect (meaning this test can be used) for the duration of the COVID-19 declaration under Section  564(b)(1) of the Act, 21 U.S.C. section 360bbb-3(b)(1), unless the authorization is terminated or revoked.  Performed at [facility], [address], [place], [place] [address]   Urine Culture     Status: Abnormal   Collection Time: 05/09/23  4:13 PM   Specimen: Urine, Random  Result Value Ref Range Status   Specimen Description   Final    URINE, RANDOM Performed at [facility], [address] [place], [place] [address]    Special Requests   Final    NONE Reflexed from [id number] Performed at [facility], [address], [place], [place] [address]    Culture (A)  Final    >=100,000 COLONIES/mL ESCHERICHIA COLI >=100,000 COLONIES/mL PSEUDOMONAS AERUGINOSA  Report Status 05/13/2023 FINAL  Final   Organism ID, Bacteria PSEUDOMONAS AERUGINOSA (A)  Final   Organism ID, Bacteria ESCHERICHIA COLI (A)  Final      Susceptibility   Escherichia coli - MIC*    AMPICILLIN <=2 SENSITIVE Sensitive     CEFAZOLIN  <=4 SENSITIVE Sensitive     CEFEPIME  <=0.12 SENSITIVE Sensitive     CEFTRIAXONE  <=0.25 SENSITIVE Sensitive     CIPROFLOXACIN  <=0.25 SENSITIVE Sensitive     GENTAMICIN <=1 SENSITIVE Sensitive     IMIPENEM <=0.25 SENSITIVE Sensitive     NITROFURANTOIN  <=16 SENSITIVE Sensitive     TRIMETH /SULFA  <=20 SENSITIVE Sensitive     AMPICILLIN/SULBACTAM <=2 SENSITIVE Sensitive     PIP/TAZO <=4 SENSITIVE Sensitive ug/mL    * >=100,000 COLONIES/mL ESCHERICHIA COLI   Pseudomonas aeruginosa - MIC*    CEFTAZIDIME 4 SENSITIVE Sensitive     CIPROFLOXACIN  <=0.25 SENSITIVE Sensitive     GENTAMICIN <=1 SENSITIVE Sensitive     IMIPENEM 2 SENSITIVE Sensitive     PIP/TAZO 16 SENSITIVE Sensitive ug/mL    CEFEPIME  2 SENSITIVE Sensitive     * >=100,000 COLONIES/mL PSEUDOMONAS AERUGINOSA  MRSA Next Gen by PCR, Nasal     Status: Abnormal   Collection Time: 05/10/23  1:29 AM   Specimen: Nasal Mucosa; Nasal Swab  Result Value Ref Range Status    MRSA by PCR Next Gen DETECTED (A) NOT DETECTED Final    Comment: (NOTE) The GeneXpert MRSA Assay (FDA approved for NASAL specimens only), is one component of a comprehensive MRSA colonization surveillance program. It is not intended to diagnose MRSA infection nor to guide or monitor treatment for MRSA infections. Test performance is not FDA approved in patients less than 19 years old. Performed at [facility], [address], [place], [place] [address]     Procedures/Studies: DG Chest Port 1 View Result Date: 05/09/2023 CLINICAL DATA:  Sepsis EXAM: PORTABLE CHEST 1 VIEW COMPARISON:  X-ray 01/09/2023 and older. FINDINGS: Hyperinflation. New consolidative patchy opacities along the left lung base. Question tiny effusion. No pneumothorax or edema. Normal cardiopericardial silhouette with calcified aorta. Osteopenia with degenerative changes of the spine and shoulder. Elevated right humeral head. Please correlate for rotator cuff tear. Presumed coronary stents. IMPRESSION: Consolidative patchy opacity at the left lung base. Possible infiltrate or pneumonia based on history. Recommend follow up to confirm clearance. Electronically Signed   By: Ranell Bring M.D.   On: 05/09/2023 17:47    Labs: BNP (last 3 results) Recent Labs    05/09/23 1527  BNP 131.0*   Basic Metabolic Panel: No results for input(s): NA, K, CL, CO2, GLUCOSE, BUN, CREATININE, CALCIUM , MG, PHOS in the last 168 hours. Liver Function Tests: No results for input(s): AST, ALT, ALKPHOS, BILITOT, PROT, ALBUMIN in the last 168 hours. No results for input(s): LIPASE, AMYLASE in the last 168 hours. No results for input(s): AMMONIA in the last 168 hours. CBC: No results for input(s): WBC, NEUTROABS, HGB, HCT, MCV, PLT in the last 168 hours. Cardiac Enzymes: No results for input(s): CKTOTAL, CKMB, CKMBINDEX, TROPONINI in the last 168 hours. BNP: Invalid  input(s): POCBNP CBG: No results for input(s): GLUCAP in the last 168 hours. D-Dimer No results for input(s): DDIMER in the last 72 hours. Hgb A1c No results for input(s): HGBA1C in the last 72 hours. Lipid Profile No results for input(s): CHOL, HDL, LDLCALC, TRIG, CHOLHDL, LDLDIRECT in the last 72 hours. Thyroid  function studies No results for input(s): TSH, T4TOTAL, T3FREE, THYROIDAB  in the last 72 hours.  Invalid input(s): FREET3 Anemia work up No results for input(s): VITAMINB12, FOLATE, FERRITIN, TIBC, IRON, RETICCTPCT in the last 72 hours. Urinalysis    Component Value Date/Time   COLORURINE YELLOW 05/09/2023 1613   APPEARANCEUR CLOUDY (A) 05/09/2023 1613   LABSPEC 1.021 05/09/2023 1613   PHURINE 5.0 05/09/2023 1613   GLUCOSEU NEGATIVE 05/09/2023 1613   HGBUR MODERATE (A) 05/09/2023 1613   BILIRUBINUR NEGATIVE 05/09/2023 1613   BILIRUBINUR negative 07/20/2022 1016   KETONESUR NEGATIVE 05/09/2023 1613   PROTEINUR 100 (A) 05/09/2023 1613   UROBILINOGEN 0.2 07/20/2022 1016   NITRITE NEGATIVE 05/09/2023 1613   LEUKOCYTESUR LARGE (A) 05/09/2023 1613   Sepsis Labs No results for input(s): WBC in the last 168 hours.  Invalid input(s): PROCALCITONIN, LACTICIDVEN Microbiology Recent Results (from the past 240 hours)  Blood Culture (routine x 2)     Status: None   Collection Time: 05/09/23  3:27 PM   Specimen: BLOOD  Result Value Ref Range Status   Specimen Description   Final    BLOOD LEFT ANTECUBITAL Performed at [facility], [address] [place], [place] [address]    Special Requests   Final    BOTTLES DRAWN AEROBIC AND ANAEROBIC Blood Culture adequate volume Performed at [facility], [address] [place], [place] [address]    Culture   Final    NO GROWTH 5 DAYS Performed at [facility], [address], [place], [place] [address]    Report Status 05/14/2023 FINAL   Final  Blood Culture (routine x 2)     Status: None   Collection Time: 05/09/23  3:40 PM   Specimen: BLOOD  Result Value Ref Range Status   Specimen Description   Final    BLOOD RIGHT ANTECUBITAL Performed at [facility], [address], [place], [place] [address]    Special Requests   Final    BOTTLES DRAWN AEROBIC AND ANAEROBIC Blood Culture adequate volume Performed at [facility], [address] [place], [place] [address]    Culture   Final    NO GROWTH 5 DAYS Performed at [facility], [address] [place], [place] [address]    Report Status 05/14/2023 FINAL  Final  Resp panel by RT-PCR (RSV, Flu A&B, Covid) Anterior Nasal Swab     Status: Abnormal   Collection Time: 05/09/23  4:05 PM   Specimen: Anterior Nasal Swab  Result Value Ref Range Status   SARS Coronavirus 2 by RT PCR POSITIVE (A) NEGATIVE Final    Comment: (NOTE) SARS-CoV-2 target nucleic acids are DETECTED.  The SARS-CoV-2 RNA is generally detectable in upper respiratory specimens during the acute phase of infection. Positive results are indicative of the presence of the identified virus, but do not rule out bacterial infection or co-infection with other pathogens not detected by the test. Clinical correlation with patient history and other diagnostic information is necessary to determine patient infection status. The expected result is Negative.  Fact Sheet for Patients: bloggercourse.com  Fact Sheet for Healthcare Providers: seriousbroker.it  This test is not yet approved or cleared by the [place]  FDA and  has been authorized for detection and/or diagnosis of SARS-CoV-2 by FDA under an Emergency Use Authorization (EUA).  This EUA will remain in effect (meaning this test can be used) for the duration of  the COVID-19 declaration under Section 564(b)(1) of the A ct, 21 U.S.C. section 360bbb-3(b)(1),  unless the authorization is terminated or revoked sooner.     Influenza A by PCR NEGATIVE NEGATIVE Final   Influenza B by PCR NEGATIVE NEGATIVE Final    Comment: (NOTE) The Xpert Xpress SARS-CoV-2/FLU/RSV plus assay is intended as an aid in the diagnosis of influenza from Nasopharyngeal swab specimens and should not be used as a sole basis for treatment. Nasal washings and aspirates are unacceptable for Xpert Xpress SARS-CoV-2/FLU/RSV testing.  Fact Sheet for Patients: bloggercourse.com  Fact Sheet for Healthcare Providers: seriousbroker.it  This test is not yet approved or cleared by the [place]  FDA and has been authorized for detection and/or diagnosis of SARS-CoV-2 by FDA under an Emergency Use Authorization (EUA). This EUA will remain in effect (meaning this test can be used) for the duration of the COVID-19 declaration under Section 564(b)(1) of the Act, 21 U.S.C. section 360bbb-3(b)(1), unless the authorization is terminated or revoked.     Resp Syncytial Virus by PCR NEGATIVE NEGATIVE Final    Comment: (NOTE) Fact Sheet for Patients: bloggercourse.com  Fact Sheet for Healthcare Providers: seriousbroker.it  This test is not yet approved or cleared by the [place]  FDA and has been authorized for detection and/or diagnosis of SARS-CoV-2 by FDA under an Emergency Use Authorization (EUA). This EUA will remain in effect (meaning this test can be used) for the duration of the COVID-19 declaration under Section 564(b)(1) of the Act, 21 U.S.C. section 360bbb-3(b)(1), unless the authorization is terminated or revoked.  Performed at [facility], [address] [place], [place] [address]   Urine Culture     Status: Abnormal   Collection Time: 05/09/23  4:13 PM   Specimen: Urine, Random  Result Value Ref Range Status   Specimen Description    Final    URINE, RANDOM Performed at [facility], [address] [place], [place] [address]    Special Requests   Final    NONE Reflexed from [id number] Performed at [facility], [address] [place], [place] [address]    Culture (A)  Final    >=100,000 COLONIES/mL ESCHERICHIA COLI >=100,000 COLONIES/mL PSEUDOMONAS AERUGINOSA    Report Status 05/13/2023 FINAL  Final   Organism ID, Bacteria PSEUDOMONAS AERUGINOSA (A)  Final   Organism ID, Bacteria ESCHERICHIA COLI (A)  Final      Susceptibility   Escherichia coli - MIC*    AMPICILLIN <=2 SENSITIVE Sensitive     CEFAZOLIN  <=4 SENSITIVE Sensitive     CEFEPIME  <=0.12 SENSITIVE Sensitive     CEFTRIAXONE  <=0.25 SENSITIVE Sensitive     CIPROFLOXACIN  <=0.25 SENSITIVE Sensitive     GENTAMICIN <=1 SENSITIVE Sensitive     IMIPENEM <=0.25 SENSITIVE Sensitive     NITROFURANTOIN  <=16 SENSITIVE Sensitive     TRIMETH /SULFA  <=20 SENSITIVE Sensitive     AMPICILLIN/SULBACTAM <=2 SENSITIVE Sensitive     PIP/TAZO <=4 SENSITIVE Sensitive ug/mL    * >=100,000 COLONIES/mL ESCHERICHIA COLI   Pseudomonas aeruginosa - MIC*    CEFTAZIDIME 4 SENSITIVE Sensitive     CIPROFLOXACIN  <=0.25 SENSITIVE Sensitive     GENTAMICIN <=1 SENSITIVE Sensitive     IMIPENEM 2 SENSITIVE Sensitive     PIP/TAZO 16 SENSITIVE Sensitive ug/mL    CEFEPIME  2 SENSITIVE Sensitive     * >=100,000 COLONIES/mL PSEUDOMONAS AERUGINOSA  MRSA Next Gen by PCR, Nasal     Status: Abnormal   Collection Time: 05/10/23  1:29 AM   Specimen: Nasal Mucosa; Nasal Swab  Result Value Ref Range  Status   MRSA by PCR Next Gen DETECTED (A) NOT DETECTED Final    Comment: (NOTE) The GeneXpert MRSA Assay (FDA approved for NASAL specimens only), is one component of a comprehensive MRSA colonization surveillance program. It is not intended to diagnose MRSA infection nor to guide or monitor treatment for MRSA infections. Test performance is not FDA approved in  patients less than 76 years old. Performed at [facility], [address], [place], [place] [address]   Time coordinating discharge: 35 minutes  SIGNED: Mennie LAMY, MD  [facility] 05/18/2023, 2:16 PM  If 7PM-7AM, please contact night-coverage [email or web address]

## 2023-05-18 NOTE — Plan of Care (Signed)
  Problem: Education: Goal: Knowledge of risk factors and measures for prevention of condition will improve Outcome: Progressing   Problem: Coping: Goal: Psychosocial and spiritual needs will be supported Outcome: Progressing   Problem: Respiratory: Goal: Will maintain a patent airway Outcome: Progressing Goal: Complications related to the disease process, condition or treatment will be avoided or minimized Outcome: Progressing   Problem: Education: Goal: Knowledge of General Education information will improve Description: Including pain rating scale, medication(s)/side effects and non-pharmacologic comfort measures Outcome: Progressing   Problem: Health Behavior/Discharge Planning: Goal: Ability to manage health-related needs will improve Outcome: Progressing   Problem: Clinical Measurements: Goal: Ability to maintain clinical measurements within normal limits will improve Outcome: Progressing Goal: Will remain free from infection Outcome: Progressing Goal: Diagnostic test results will improve Outcome: Progressing Goal: Respiratory complications will improve Outcome: Progressing Goal: Cardiovascular complication will be avoided Outcome: Progressing   Problem: Nutrition: Goal: Adequate nutrition will be maintained Outcome: Progressing   Problem: Coping: Goal: Level of anxiety will decrease Outcome: Progressing   

## 2023-05-18 NOTE — TOC Transition Note (Addendum)
 Transition of Care [facility]) - Discharge Note   Patient Details  Name: James Toomey Sr. MRN: [id number] Date of Birth: 11-Apr-1932  Transition of Care [facility] CM/SW Contact:  Sonda Manuella Quill, RN Phone Number: 05/18/2023, 2:33 PM   Clinical Narrative:    D/C orders received; pt has been approved for [facility]; transport by [facility]; pt's dtr [name] [phone]) notified and agrees to d/c plan; awaiting ok to call for pt transport.  -[id number]- notified by Elouise Husband call report # [phone]; bed assigned upon arrival to facility; [facility] called at [phone]; spoke w/ operator # [phone]; no TOC needs.  Final next level of care: [facility] Barriers to Discharge: No Barriers Identified   Patient Goals and CMS Choice Patient states their goals for this hospitalization and ongoing recovery are:: Per daughter to return back to [employer] unless patient gets worse. CMS Medicare.gov Compare Post Acute Care list provided to:: Patient Represenative (must comment) (Patient's daughter James) Choice offered to / list presented to : Adult Children [place] ownership interest in [facility].provided to:: Adult Children    Discharge Placement                  Name of family member notified: [name] (dtr) [phone] Patient and family notified of of transfer: 05/18/23  Discharge Plan and Services Additional resources added to the After Visit Summary for       Post Acute Care Choice: Skilled Nursing Facility                               Social Drivers of Health (SDOH) Interventions SDOH Screenings   Food Insecurity: Unknown (05/10/2023)  Housing: Low Risk  (12/16/2022)  Transportation Needs: Patient Unable To Answer (05/10/2023)  Utilities: Not At Risk (12/16/2022)  Alcohol  Screen: Low Risk  (07/08/2022)  Depression (PHQ2-9): Low Risk  (07/26/2022)  Financial Resource Strain: Low Risk  (03/30/2022)  Physical Activity: Insufficiently Active  (01/29/2021)  Social Connections: Socially Integrated (01/29/2021)  Stress: No Stress Concern Present (01/29/2021)  Tobacco Use: Medium Risk (05/13/2023)     Readmission Risk Interventions    05/15/2023    5:23 PM  Readmission Risk Prevention Plan  Transportation Screening Complete  PCP or Specialist Appt within 3-5 Days Complete  HRI or Home Care Consult Not Complete  HRI or Home Care Consult comments Patient at [employer] under private pay, [facility] not applicable.  Social Work Consult for Recovery Care Planning/Counseling Complete  Palliative Care Screening Complete  Medication Review Oceanographer) Referral to Pharmacy

## 2023-06-05 ENCOUNTER — Ambulatory Visit: Payer: Medicare HMO | Admitting: Cardiology

## 2023-06-10 DEATH — deceased

## 2023-10-17 ENCOUNTER — Ambulatory Visit: Payer: Medicare HMO | Admitting: Internal Medicine
